# Patient Record
Sex: Female | Born: 1950
Health system: Southern US, Community
[De-identification: ages and names within clinical notes are randomized; demographics above are authoritative.]

## PROBLEM LIST (undated history)

## (undated) DIAGNOSIS — H3500 Unspecified background retinopathy: Secondary | ICD-10-CM

## (undated) DIAGNOSIS — I509 Heart failure, unspecified: Secondary | ICD-10-CM

## (undated) DIAGNOSIS — F419 Anxiety disorder, unspecified: Secondary | ICD-10-CM

## (undated) DIAGNOSIS — E669 Obesity, unspecified: Secondary | ICD-10-CM

## (undated) DIAGNOSIS — R600 Localized edema: Secondary | ICD-10-CM

## (undated) DIAGNOSIS — I272 Pulmonary hypertension, unspecified: Secondary | ICD-10-CM

## (undated) DIAGNOSIS — E7800 Pure hypercholesterolemia, unspecified: Secondary | ICD-10-CM

## (undated) DIAGNOSIS — I10 Essential (primary) hypertension: Secondary | ICD-10-CM

## (undated) DIAGNOSIS — G4733 Obstructive sleep apnea (adult) (pediatric): Secondary | ICD-10-CM

## (undated) DIAGNOSIS — H53009 Unspecified amblyopia, unspecified eye: Secondary | ICD-10-CM

## (undated) DIAGNOSIS — F329 Major depressive disorder, single episode, unspecified: Secondary | ICD-10-CM

## (undated) HISTORY — DX: Pulmonary hypertension, unspecified: I27.20

## (undated) HISTORY — PX: DILATION AND CURETTAGE OF UTERUS: SHX78

## (undated) HISTORY — DX: Obesity, unspecified: E66.9

## (undated) HISTORY — DX: Major depressive disorder, single episode, unspecified: F32.9

## (undated) HISTORY — PX: EYE SURGERY: SHX253

## (undated) HISTORY — DX: Anxiety disorder, unspecified: F41.9

## (undated) HISTORY — DX: Unspecified amblyopia, unspecified eye: H53.009

## (undated) HISTORY — PX: WISDOM TOOTH EXTRACTION: SHX21

## (undated) HISTORY — DX: Essential (primary) hypertension: I10

## (undated) HISTORY — DX: Unspecified background retinopathy: H35.00

## (undated) HISTORY — DX: Obstructive sleep apnea (adult) (pediatric): G47.33

## (undated) HISTORY — DX: Localized edema: R60.0

## (undated) HISTORY — DX: Pure hypercholesterolemia, unspecified: E78.00

---

## 1998-06-28 ENCOUNTER — Encounter: Payer: Self-pay | Admitting: Obstetrics and Gynecology

## 1998-06-28 ENCOUNTER — Ambulatory Visit (HOSPITAL_COMMUNITY): Admission: RE | Admit: 1998-06-28 | Discharge: 1998-06-28 | Payer: Self-pay | Admitting: Obstetrics and Gynecology

## 1999-04-12 ENCOUNTER — Other Ambulatory Visit: Admission: RE | Admit: 1999-04-12 | Discharge: 1999-04-12 | Payer: Self-pay | Admitting: Obstetrics and Gynecology

## 1999-08-01 ENCOUNTER — Ambulatory Visit (HOSPITAL_COMMUNITY): Admission: RE | Admit: 1999-08-01 | Discharge: 1999-08-01 | Payer: Self-pay | Admitting: Obstetrics and Gynecology

## 1999-08-01 ENCOUNTER — Encounter: Payer: Self-pay | Admitting: Obstetrics and Gynecology

## 1999-09-07 ENCOUNTER — Emergency Department (HOSPITAL_COMMUNITY): Admission: EM | Admit: 1999-09-07 | Discharge: 1999-09-07 | Payer: Self-pay | Admitting: *Deleted

## 2000-04-19 ENCOUNTER — Other Ambulatory Visit: Admission: RE | Admit: 2000-04-19 | Discharge: 2000-04-19 | Payer: Self-pay | Admitting: Obstetrics and Gynecology

## 2000-10-14 ENCOUNTER — Encounter: Payer: Self-pay | Admitting: Obstetrics and Gynecology

## 2000-10-14 ENCOUNTER — Encounter: Admission: RE | Admit: 2000-10-14 | Discharge: 2000-10-14 | Payer: Self-pay | Admitting: Obstetrics and Gynecology

## 2000-11-05 ENCOUNTER — Encounter: Admission: RE | Admit: 2000-11-05 | Discharge: 2001-01-22 | Payer: Self-pay | Admitting: *Deleted

## 2001-05-30 ENCOUNTER — Other Ambulatory Visit: Admission: RE | Admit: 2001-05-30 | Discharge: 2001-05-30 | Payer: Self-pay | Admitting: Obstetrics and Gynecology

## 2002-04-25 ENCOUNTER — Encounter: Payer: Self-pay | Admitting: Internal Medicine

## 2002-04-25 ENCOUNTER — Ambulatory Visit (HOSPITAL_COMMUNITY): Admission: RE | Admit: 2002-04-25 | Discharge: 2002-04-25 | Payer: Self-pay | Admitting: Internal Medicine

## 2002-06-03 ENCOUNTER — Other Ambulatory Visit: Admission: RE | Admit: 2002-06-03 | Discharge: 2002-06-03 | Payer: Self-pay | Admitting: Obstetrics and Gynecology

## 2002-10-23 ENCOUNTER — Encounter (INDEPENDENT_AMBULATORY_CARE_PROVIDER_SITE_OTHER): Payer: Self-pay | Admitting: *Deleted

## 2002-10-23 ENCOUNTER — Ambulatory Visit (HOSPITAL_COMMUNITY): Admission: RE | Admit: 2002-10-23 | Discharge: 2002-10-23 | Payer: Self-pay | Admitting: Obstetrics and Gynecology

## 2003-01-06 ENCOUNTER — Encounter: Admission: RE | Admit: 2003-01-06 | Discharge: 2003-01-06 | Payer: Self-pay | Admitting: Obstetrics and Gynecology

## 2003-01-06 ENCOUNTER — Encounter: Payer: Self-pay | Admitting: Obstetrics and Gynecology

## 2003-06-16 ENCOUNTER — Other Ambulatory Visit: Admission: RE | Admit: 2003-06-16 | Discharge: 2003-06-16 | Payer: Self-pay | Admitting: Obstetrics and Gynecology

## 2004-05-16 ENCOUNTER — Encounter: Admission: RE | Admit: 2004-05-16 | Discharge: 2004-05-16 | Payer: Self-pay | Admitting: Obstetrics and Gynecology

## 2004-08-04 ENCOUNTER — Other Ambulatory Visit: Admission: RE | Admit: 2004-08-04 | Discharge: 2004-08-04 | Payer: Self-pay | Admitting: Obstetrics and Gynecology

## 2005-01-15 ENCOUNTER — Encounter: Admission: RE | Admit: 2005-01-15 | Discharge: 2005-01-15 | Payer: Self-pay | Admitting: Family Medicine

## 2005-07-30 ENCOUNTER — Encounter: Admission: RE | Admit: 2005-07-30 | Discharge: 2005-07-30 | Payer: Self-pay | Admitting: Obstetrics and Gynecology

## 2005-09-14 ENCOUNTER — Other Ambulatory Visit: Admission: RE | Admit: 2005-09-14 | Discharge: 2005-09-14 | Payer: Self-pay | Admitting: Obstetrics and Gynecology

## 2005-12-10 ENCOUNTER — Ambulatory Visit (HOSPITAL_COMMUNITY): Admission: RE | Admit: 2005-12-10 | Discharge: 2005-12-10 | Payer: Self-pay | Admitting: Obstetrics and Gynecology

## 2006-05-22 ENCOUNTER — Ambulatory Visit (HOSPITAL_COMMUNITY): Admission: RE | Admit: 2006-05-22 | Discharge: 2006-05-22 | Payer: Self-pay | Admitting: Obstetrics and Gynecology

## 2006-08-28 ENCOUNTER — Encounter: Admission: RE | Admit: 2006-08-28 | Discharge: 2006-08-28 | Payer: Self-pay | Admitting: Obstetrics and Gynecology

## 2007-09-02 ENCOUNTER — Encounter: Admission: RE | Admit: 2007-09-02 | Discharge: 2007-09-02 | Payer: Self-pay | Admitting: Obstetrics & Gynecology

## 2007-09-24 ENCOUNTER — Other Ambulatory Visit: Admission: RE | Admit: 2007-09-24 | Discharge: 2007-09-24 | Payer: Self-pay | Admitting: Obstetrics & Gynecology

## 2008-09-30 ENCOUNTER — Other Ambulatory Visit: Admission: RE | Admit: 2008-09-30 | Discharge: 2008-09-30 | Payer: Self-pay | Admitting: Obstetrics & Gynecology

## 2009-06-01 ENCOUNTER — Encounter: Admission: RE | Admit: 2009-06-01 | Discharge: 2009-06-01 | Payer: Self-pay | Admitting: Obstetrics & Gynecology

## 2010-06-06 ENCOUNTER — Encounter
Admission: RE | Admit: 2010-06-06 | Discharge: 2010-06-06 | Payer: Self-pay | Source: Home / Self Care | Attending: Obstetrics & Gynecology | Admitting: Obstetrics & Gynecology

## 2010-07-16 ENCOUNTER — Encounter: Payer: Self-pay | Admitting: Family Medicine

## 2010-11-10 NOTE — Op Note (Signed)
NAMEAUBREANA, CORNACCHIA NO.:  [id number]   MEDICAL RECORD NO.:  [id number]          PATIENT TYPE:  AMB   LOCATION:  SDC                           FACILITY:  [facility]   PHYSICIAN:  Dineen Kid. Rana Snare, M.D.    DATE OF BIRTH:  1951/02/21   DATE OF PROCEDURE:  05/22/2006  DATE OF DISCHARGE:                               OPERATIVE REPORT   PREOPERATIVE DIAGNOSIS:  Abnormal uterine bleeding with endometrial mass  __________.   POSTOPERATIVE DIAGNOSIS:  1. Abnormal uterine bleeding with endometrial mass __________.  2. Uterine synechiae.   OPERATION/PROCEDURE:  Hysteroscopy and dilatation and curettage.   SURGEON:  Dineen Kid. Rana Snare, M.D.   ANESTHESIA:  General by LMA.   INDICATIONS:  Mrs. Kayla Brown is a 60 year old nulligravida white female on  Depo-Provera where for a number of years began irregular spotting. Has  history of endometrial polyp which was benign several years ago which  was removed on D&C.  She underwent saline infusion ultrasound showing a  small endometrial mass suspicious for endometrial polyp. Presents today  for hysteroscopy D&C.  We had also discussed the possibility of doing a  NovaSure endometrial ablation.  She has read the handout and she has  given her written informed consent.  Risks and benefits of the procedure  were discussed at length.  These include but are not limited to risk of  infection, bleeding, damage to the uterus, tubes and ovaries, bowel and  bladder, risks associated with blood transfusions, risks associated with  anesthesia, success rates and risks associated with endometrial  ablation.  She does give her informed consent and wished to proceed.   FINDINGS:  A very small uterus with stenotic cervix and atrophic  endometrium.  Uterine synechiae from the anterior posterior endometrial  wall.  Perforation from the uterine sounds through the fundus which was  hemostatic.   DESCRIPTION OF PROCEDURE:  After adequate analgesia, the patient  was  placed in the dorsal lithotomy position.  She was sterilely prepped and  draped.  The bladder was sterilely drained.  Weighted speculum was  placed and tenaculum was placed on the anterior lip of the cervix.  A  paracervical block was placed with 1% Xylocaine and 1:100,000  epinephrine.  The uterus was sounded to 6 cm originally and after  dilating to a #25 dilator, the uterine sound was inserted again, now  going to 8-9 cm.  Insertion of the hysteroscope reveals the above  findings. Uterine cavity looked pretty much like cobwebs with uterine  synechiae from the top to the bottom, very fine, filmy with occasional  larger bands disrupted easily with the hysteroscope.  Also gentle sharp  curettage reexamination with the hysteroscope revealed a fundal  perforation was through the uterine fundus, unable to see into the intra-  abdominal cavity.  My suspicions were that it was just below the serosa.  It was hemostatic after careful examination for several minutes.  Reexamination of the uterine cavity revealed the uterine synechiae had  been disrupted with the camera and also the dilation by hysteroscope and  curettage.  No evidence of  residual polyps were noted due to the uterine  perforation.  Although hemostatic, we elected not to proceed with an  endometrial ablation.  The hysteroscope was then removed, tenaculum  removed from the cervix.  Reexamination of the cervix revealed minimal  bleeding through the endocervix.  __________ exam revealed a very small  uterus which was mobile and no masses were palpable.  We did have a  sorbitol deficit of 435 mL at the time of discontinuing the system with  the small uterine perforation.  The patient was stable on transfer to  the recovery room.  Sponge, instrument and needle counts were correct  x3.  Estimated blood loss was minimal.  The patient did receive 1 g of  cefoxitin preoperatively.   DISPOSITION:  The patient will be observed for a  little while in the  recovery room to make sure she is stable.  If she does remain stable,  then we will discharge her home on Cipro 500 mg b.i.d. for seven days  and return for any increased pain, fever or bleeding in the next 24 to  48 hours especially.  Otherwise we will have her follow up one week in  the office.      Dineen Kid Rana Snare, M.D.  Electronically Signed     DCL/MEDQ  D:  05/22/2006  T:  05/22/2006  Job:  [id number]

## 2010-11-10 NOTE — Op Note (Signed)
NAME:  Kayla Brown, Kayla Brown                         ACCOUNT NO.:  [id number]   MEDICAL RECORD NO.:  [id number]                   PATIENT TYPE:  AMB   LOCATION:  SDC                                  FACILITY:  [facility]   PHYSICIAN:  Dineen Kid. Rana Snare, M.D.                 DATE OF BIRTH:  [birth year]   DATE OF PROCEDURE:  10/23/2002  DATE OF DISCHARGE:                                 OPERATIVE REPORT   PREOPERATIVE DIAGNOSES:  1. Abnormal uterine bleeding.  2. Endometrial mass on sonohysterogram.   POSTOPERATIVE DIAGNOSES:  1. Abnormal uterine bleeding.  2. Endometrial mass on sonohysterogram.  3. Questionable endometrial polyp.   PROCEDURE:  Hysteroscopy, dilatation and curettage.   SURGEON:  Dineen Kid. Rana Snare, M.D.   ANESTHESIA:  Monitored anesthetic care and paracervical block.   INDICATIONS:  The patient is a 60 year old nulligravida white female with  abnormal bleeding who underwent a sonohysterogram, which showed multiple  fibroids but also endometrial mass measuring 3.7 mm in size, polypoid in  nature.  It was consistent with a polyp.  She presents for a hysteroscopy  and D&C evaluation and removal of this.  Risks and benefits were discussed  at length, informed consent was obtained, and the findings of a small  endometrial polypoid mass consistent with a polyp, otherwise normal-  appearing endometrium, question of a small submucosal fibroid at the  posterior wall, normal-appearing ostia, normal-appearing cervix.   DESCRIPTION OF PROCEDURE:  After adequate anesthesia, the patient was placed  in the dorsal lithotomy position.  She was sterilely prepped and draped, the  bladder was sterilely drained.  The Graves speculum was placed, a  paracervical block was placed with 1% Xylocaine with 1:100,000 epinephrine,  20 mL used.  A tenaculum was placed on the anterior lip of the cervix and  uterus was sounded to 7 cm using the dilator to a #29 Pratt dilator.  The  hysteroscope was inserted, and  the above findings were noted.  Sharp  curettage was performed and curetted small fragments of tissue.  This was  performed until a gritty surface was felt throughout the endometrial cavity.  The hysteroscope was reinserted and normal-appearing endometrium at this  time was noted with normal-appearing ostia and it appeared that the polyp  had been removed.  The tissue was sent for the endometrial curettings.  At  this time the hysteroscope was removed.  The tenaculum was removed from the  anterior lip of the cervix, noted to be hemostatic.  The patient was stable  on transfer to the recovery room.  The sponge, needle, and instrument count  was normal x3.  The patient received Toradol 30 mg IV.  The sorbitol deficit  was minimal. The estimated blood loss was less than 10 mL.    DISPOSITION:  The patient was discharged home with a routine instruction  sheet, to follow up in the office in two  or three weeks, told to return for  any increased bleeding, pain, or fever.                                                Dineen Kid Rana Snare, M.D.    DCL/MEDQ  D:  10/23/2002  T:  10/24/2002  Job:  [id number]

## 2010-11-10 NOTE — H&P (Signed)
   NAME:  Kayla Brown, Kayla Brown                         ACCOUNT NO.:  [id number]   MEDICAL RECORD NO.:  [id number]                   PATIENT TYPE:  AMB   LOCATION:  SDC                                  [facility]   PHYSICIAN:  Dineen Kid. Rana Snare, M.D.                 DATE OF BIRTH:  10/10/1950   DATE OF ADMISSION:  DATE OF DISCHARGE:                                HISTORY & PHYSICAL   HISTORY OF PRESENT ILLNESS:  The patient is a 60 year old nulligravida white  female with abnormal uterine bleeding.  She underwent a sonohystogram on  September 18, 2002, which showed multiple fibroids and an endometrial mass  measuring 3.7 mm in size, which is polypoid consistent with a polyp on the  anterior wall.  She presents today for hysteroscopy and D&C for evaluation  and removal of this.   PAST MEDICAL HISTORY:  The past medical history is significant for  hypertension and anxiety disorder.  Also diagnosed with adult onset  diabetes.   MEDICATIONS:  Medications include Glucotrol, Altace, Ecotrin,  hydrochlorothiazide, Effexor, Ocuvite, Pressor Vision, and Depo-Provera.   PHYSICAL EXAMINATION:  An ultrasound showed multiple fibroids, the largest  measuring 3.2 cm in size, and the right and left ovaries within normal  limits.  Endometrial mass 3.7 cm, polypoid on the anterior wall, consistent  a polyp.   IMPRESSION AND PLAN:  1. Abnormal uterine bleeding.  2. Fibroids and endometrial mass consistent with polyp.   Will admit for hysteroscopy and D&C for evaluation and removal of the polyp.  I feel that D&C has a good chance of fixing the bleeding due to the fact  that there is something consistent with a polyp noticeable and it could also  be related to the fibroids.  Because there is an endometrial mass I  recommend that this be removed and studied in the pathology lab.  The risks  and benefits of hysterectomy and D&C were discussed at length and informed  consent was obtained.                                      Dineen Kid Rana Snare, M.D.    DCL/MEDQ  D:  10/22/2002  T:  10/22/2002  Job:  [id number]

## 2010-11-10 NOTE — Op Note (Signed)
Kayla Brown, Kayla Brown               ACCOUNT NO.:  [id number]   MEDICAL RECORD NO.:  [id number]          PATIENT TYPE:  AMB   LOCATION:  ENDO                         FACILITY:  [facility]   PHYSICIAN:  James L. Malon Kindle., M.D.DATE OF BIRTH:  12/31/1950   DATE OF PROCEDURE:  12/10/2005  DATE OF DISCHARGE:                                 OPERATIVE REPORT   PROCEDURE PERFORMED:  Colonoscopy.   ENDOSCOPIST:  Llana Aliment. Randa Evens, M.D.   MEDICATIONS:  Phenergan 12.5 mg, fentanyl 75 mcg, Versed 8 mg IV.   INDICATIONS FOR PROCEDURE:  Colon cancer screening.   DESCRIPTION OF PROCEDURE:  The procedure had been explained to the patient  and consent obtained.  With the patient in the left lateral decubitus  position, the pediatric adjustable Olympus colonoscope was inserted and  advanced.  The prep was excellent and we were able to reach the cecum  without difficulty.  The ileocecal valve and appendiceal orifice were seen.  The scope was withdrawn and the cecum, ascending colon, transverse colon,  descending and sigmoid colon were seen well, no polyps seen.  Normal  vascular pattern throughout.  No diverticulosis, masses, etc.  The scope was  withdrawn to the rectum.  Minimal hemorrhoids were seen on retroflex view of  the rectum and was otherwise normal.  Scope withdrawn.  The patient  tolerated the procedure well.   ASSESSMENT:  1.  Normal screening colonoscopy, V76.51.   PLAN:  Routine follow-up with resumption of Hemoccults in five years.  Consider another colonoscopy in 10 years.           ______________________________  Llana Aliment Malon Kindle., M.D.     Waldron Session  D:  12/10/2005  T:  12/11/2005  Job:  [id number]   cc:   Dineen Kid. Rana Snare, M.D.  Fax: [phone]   Vikki Ports, M.D.  Fax: [phone]

## 2011-05-14 ENCOUNTER — Encounter: Payer: 59 | Attending: Family Medicine | Admitting: *Deleted

## 2011-05-15 ENCOUNTER — Encounter: Payer: Self-pay | Admitting: *Deleted

## 2011-05-15 NOTE — Patient Instructions (Signed)
Patient will attend Core Diabetes Courses as scheduled or follow up prn.  

## 2011-05-15 NOTE — Progress Notes (Signed)
  Patient was seen on 05/14/11 for the first of a series of three diabetes self-management courses at the [facility]. The following learning objectives were met by the patient during this course:   Defines diabetes and the role of insulin  Identifies type of diabetes and pathophysiology  States normal BG range and personal goals  Identifies three risk factors for the development of diabetes  States the need for and frequency of healthcare follow up (ADA Standards of Care)  A1c Level: 6.9% (per [facility]; 05/02/11)  Handouts given during class include:  [facility] Core Class 1 Handout  ADA: What's My Number (A1c handout)  [facility] ADA Standards of Care Handout  [facility] Medication Log handout  ADA Quick & Easy Diabetic Cooking (cookbook)  "The Plate Method" tear off  Patient has established the following initial goals:  Increase exercise  Work on stress levels  Lose weight  Follow-Up Plan: Patient will attend Core Diabetes Courses as scheduled or follow up prn.

## 2011-06-05 ENCOUNTER — Encounter: Payer: 59 | Attending: Family Medicine

## 2011-06-11 NOTE — Progress Notes (Signed)
  Patient was seen on 06/05/2011 for the second of a series of three diabetes self-management courses at the [facility]. The following learning objectives were met by the patient during this course:   States the relationship of exercise to blood glucose  States benefits/barriers of regular and safe exercise  States three guidelines for safe and effective exercise  Describes personal diabetes medicine regimen  Describes actions of own medications  Describes causes, symptoms, and treatment of hypo/hyperglycemia  Describes sick day rules  Identifies when to test urine for ketones when appropriate  Identifies when to call healthcare provider for acute complications  States the risk for problems with foot, skin, and dental care  States preventative foot, skin, and dental care measures  States when to call healthcare provider regarding foot, skin, and dental care  Identifies methods for evaluation of diabetes control  Discusses benefits of SBGM  Identifies relationship between nutrition, exercise, medication, and glucose levels  Discusses the importance of record keeping  Patient received [facility] Core Program Notebook at class.  Follow-Up Plan: Patient will attend the final class of the ADA Diabetes Self-Care Education.    

## 2011-06-12 ENCOUNTER — Encounter: Payer: 59 | Admitting: Dietician

## 2011-06-13 NOTE — Progress Notes (Signed)
  Patient was seen on 06/13/2011 for the third of a series of three diabetes self-management courses at the [facility]. The following learning objectives were met by the patient during this course:   Identifies nutrient effects on glycemia  States the general guidelines of meal planning  Relates understanding of personal meal plan  Describes situations that cause stress and discuss methods of stress management  Identifies lifestyle behaviors for change  The following handouts were given in class:  [employer] Carbohydrate Counting book  3 Month Follow Up Visit handout  Goal setting handout  Class evaluation form  Your patient has established the following 3 month goals for diabetes self-care:  Bike for 15 minutes 3 times weekly, intend to start out slow.  Follow-Up Plan: Patient will attend a 3 month follow-up visit for diabetes self-management education.

## 2011-06-29 ENCOUNTER — Other Ambulatory Visit: Payer: Self-pay | Admitting: Obstetrics & Gynecology

## 2011-06-29 DIAGNOSIS — Z1231 Encounter for screening mammogram for malignant neoplasm of breast: Secondary | ICD-10-CM

## 2011-08-06 ENCOUNTER — Ambulatory Visit
Admission: RE | Admit: 2011-08-06 | Discharge: 2011-08-06 | Disposition: A | Discharge status: Home / Self Care | Payer: 59 | Source: Ambulatory Visit | Attending: Obstetrics & Gynecology | Admitting: Obstetrics & Gynecology

## 2011-08-06 DIAGNOSIS — Z1231 Encounter for screening mammogram for malignant neoplasm of breast: Secondary | ICD-10-CM

## 2011-09-20 ENCOUNTER — Encounter: Payer: 59 | Attending: Family Medicine | Admitting: Dietician

## 2011-09-24 ENCOUNTER — Encounter: Payer: Self-pay | Admitting: Dietician

## 2011-09-24 NOTE — Progress Notes (Signed)
  Patient was seen on 09/19/2016 for their 3 month follow-up as a part of the diabetes self-management courses at the [facility]. The following learning objectives were met by your patient during this course:  Patient self reports the following:  Diabetes control has improved since diabetes self-management training: yes Number of days blood glucose is >200: Rare Last MD appointment for diabetes: December 2012 Changes in treatment plan: Changed Actos to generic version of the medication Confidence with ability to manage diabetes: Feels better able to manage her diabetes Areas for improvement with diabetes self-care: Trying to do what I'm suppose to do. Exercise more often and to gain control over my portion sizes. Willingness to participate in diabetes support group: Unsure related to husband's work schedule  Weight:  04/2011= 206.9 lb   09/20/2011= 207.8 lb  Gained 0.9 lb in last 4 months A1C 05/2011 = 6.8%  Please see Diabetes Flow sheet for findings related to patient's self-care.  Follow-Up Plan: Patient is eligible for a "free" 30 minute diabetes self-care appointment in the next year. Patient to call and schedule as needed.

## 2012-12-31 ENCOUNTER — Ambulatory Visit (INDEPENDENT_AMBULATORY_CARE_PROVIDER_SITE_OTHER): Payer: Self-pay | Admitting: Family Medicine

## 2012-12-31 VITALS — BP 146/74 | Wt 194.0 lb

## 2012-12-31 DIAGNOSIS — E119 Type 2 diabetes mellitus without complications: Secondary | ICD-10-CM

## 2013-01-01 DIAGNOSIS — E119 Type 2 diabetes mellitus without complications: Secondary | ICD-10-CM | POA: Insufficient documentation

## 2013-01-01 NOTE — Progress Notes (Signed)
Patient presents today for annual pharmacy consult as part of the employer-sponsored [employer] program. Current diabetes regimen includes pioglitazone, glipizide, and metformin. Patient also continues on daily ASA, ACEi, and statin. Most recent MD follow-up was in early June. Patient has a pending appt for 6 mo follow-up. No med changes other than addition of Vit D 4000 IU daily. No major health changes at this time.    Diabetes Assessment: Type of Diabetes: Type 2; Sees Diabetes provider 2 times per year; Diabetes Education Attended [facility] classes; MD managing Diabetes Juluis Rainier MD; checks blood glucose 2-4 times a day; takes medications as prescribed; uses glucometer Trueresult glucometer; 7 day CBG average 131; 30 day CBG average 121; 14 day CBG average 129; Lowest CBG 62; Highest CBG 267; hypoglycemia frequency freq; takes an aspirin a day; A1c = 6.7 (prev 6.9) Other Diabetes History: Current med regimen includes pioglitazone 30 mg daily, glipizide xl 10 mg twice daily, and metformin ER 500 mg - two tablets twice daily. Patient does maintain good medication compliance. Patient did bring meter today and is currently testing 2-4 times per day and keeps a great glucose log with all readings. Glucose monitoring occurs fasting/pre-prandial/post-prandial/bedtime/when symptomatic. Hypoglycemia is frequent in the mornings with fasting readings often in the 60s. Post prandial readings sometimes reach >200, but on these occassions patient almost always recalls dietary non-compliance as the cause. Overall glucose is under good control. At this time patient is taking her evening dose of glipizide prior to bed. I have recommended moving this dose to dinner and always taking glipizide with food in order to reduce fasting hypoglycemia. Patient agreed to try this. Patient reports signs and symptoms of neuropathy including numbness/tingling/burning and symptoms of foot infection; however, she does have a  recurring area of onychomycosis on her left second toe. She will see a podiatrist if this does not improve. MD is aware. Patient is not due for yearly eye exam.  Lifestyle Factors: Diet - Patient admits that her diet has room for improvement but states that she is attempting to make better choices. She reports the following food recall:  Breakfast - cheese toast/milk Lunch - usually eats out for lunch, [place] buff[employer], fast food, [employer] - cooks at home Snacks - Rev wrap by [employer], BP, cheese Fruits/Veg - only on occassion, sometimes eats a salad or orders vegetables when eating out. Beverages - water, diet coke, tea with sweet-n-low, 2% milk Patient reports the most uncontrolled area of her diet is her consumption of fried foods. She enjoys french fries, fried foods, etc approximately three times per week. Patient and her husband both have a nutrition consult scheduled at end of August.  Exercise - Patient maintains an excellent exercise regimen and records all physical activity in a log book. She is currently exercising on a stationary bike for 10-20 min 3-4 times per week. She is aware of the 150 min/week goal and hopes to acheive this in the future.   Assessment: Patient presents today for annual pharmacy consult. She is knowledgable about medications and reports compliance. She does a great job of testing and recording these readings in a log book. She also keeps a great journal of physical activity. A1c is at goal and is currently 6.7 (prev 6.9 on 11/26/12). Patient was pleased with improved A1c. At this time her biggest challenge is her diet and we have set goals to work on improving this. Patient will begin following-up with Megan in three months.   Plan: 1)  Attempt to limit fried foods to once weekly 2) Attempt to maintain healthy dietary choices  3) Continue exercise regimen - great job! 4) Continue testing glucose - great job! 5) Begin taking evening glipizide dose with supper 6)  Follow-up in 3 months on Monday, October 13th @ 2:30 pm with Gerre Pebbles, pharmacist

## 2013-01-06 ENCOUNTER — Encounter: Payer: Self-pay | Admitting: Certified Nurse Midwife

## 2013-01-08 ENCOUNTER — Ambulatory Visit (INDEPENDENT_AMBULATORY_CARE_PROVIDER_SITE_OTHER): Payer: 59 | Admitting: Certified Nurse Midwife

## 2013-01-08 ENCOUNTER — Encounter: Payer: Self-pay | Admitting: Certified Nurse Midwife

## 2013-01-08 VITALS — BP 110/62 | HR 72 | Resp 16 | Ht 60.25 in | Wt 195.0 lb

## 2013-01-08 DIAGNOSIS — Z0000 Encounter for general adult medical examination without abnormal findings: Secondary | ICD-10-CM

## 2013-01-08 DIAGNOSIS — Z01419 Encounter for gynecological examination (general) (routine) without abnormal findings: Secondary | ICD-10-CM

## 2013-01-08 DIAGNOSIS — N951 Menopausal and female climacteric states: Secondary | ICD-10-CM

## 2013-01-08 DIAGNOSIS — Z1211 Encounter for screening for malignant neoplasm of colon: Secondary | ICD-10-CM

## 2013-01-08 MED ORDER — PROGESTERONE MICRONIZED 200 MG PO CAPS: 200.0000 mg | ORAL_CAPSULE | Freq: Every day | ORAL | Status: DC

## 2013-01-08 MED ORDER — ESTRADIOL 0.5 MG PO TABS: 0.5000 mg | ORAL_TABLET | Freq: Every day | ORAL | Status: DC

## 2013-01-08 NOTE — Progress Notes (Signed)
62 y.o. G0P0000 Married Caucasian Fe here for annual exam.  Menopausal on HRT , desires continuance.Denies vaginal bleeding or vaginal discharge. Diabetes in control with medication. Hypertension stable on medication. Sees PCP every 6 months for medication management and labs. No health issues today.  Patient's last menstrual period was 06/25/2008.          Sexually active: yes  The current method of family planning is none.    Exercising: yes  biking Smoker:  no  Health Maintenance: Pap:  01-04-12 neg HPV HR neg MMG:  2013 Colonoscopy:  2007 BMD:   2009 TDaP:  2011 Labs: none Self breast exam: done occ   reports that she has never smoked. She has never used smokeless tobacco. She reports that she does not drink alcohol or use illicit drugs.  Past Medical History  Diagnosis Date  . Diabetes mellitus   . Pure hypercholesterolemia   . Essential hypertension, benign   . Major depressive disorder, single episode, unspecified     Past Surgical History  Procedure Laterality Date  . Dilation and curettage of uterus  2005-2008    x2  . Eye surgery      cataracts  . Wisdom tooth extraction      Current Outpatient Prescriptions  Medication Sig Dispense Refill  . AMLODIPINE BESYLATE PO Take 5 mg by mouth daily.      [name] aspirin 325 MG tablet Take 325 mg by mouth daily.        [name] atorvastatin (LIPITOR) 40 MG tablet Take 40 mg by mouth daily.        . Cholecalciferol (VITAMIN D PO) Take 4,000 Int'l Units by mouth.      [name] CINNAMON PO Take 1,000 mg by mouth daily.      . cyclobenzaprine (FLEXERIL) 10 MG tablet Take 10 mg by mouth 3 (three) times daily as needed for muscle spasms.      [name] diltiazem (DILACOR XR) 240 MG 24 hr capsule Take 240 mg by mouth daily.        [name] estradiol (ESTRACE) 0.5 MG tablet Take by mouth daily.       . fish oil-omega-3 fatty acids 1000 MG capsule Take 1 g by mouth daily.        [name] FOLIC ACID PO Take 1 tablet by mouth daily.        [name] GARLIC PO Take 1,610 mg by  mouth daily.        [name] glipiZIDE (GLIPIZIDE XL) 10 MG 24 hr tablet Take 10 mg by mouth 2 (two) times daily.        . hydrochlorothiazide (MICROZIDE) 12.5 MG capsule Take 12.5 mg by mouth daily.        [name] loratadine (CLARITIN) 10 MG tablet Take 10 mg by mouth as needed.       . metFORMIN (GLUCOPHAGE-XR) 500 MG 24 hr tablet Take by mouth. 4 tabs at hs      . Multiple Vitamins-Minerals (VISION FORMULA PO) Take 50 mg by mouth daily.      . Multiple Vitamins-Minerals (VISION FORMULA) TABS Take 1 tablet by mouth every morning.       . nabumetone (RELAFEN) 500 MG tablet Take 500 mg by mouth as needed for pain.      . pioglitazone (ACTOS) 30 MG tablet Take 30 mg by mouth daily.        . progesterone (PROMETRIUM) 200 MG capsule Take 200 mg by mouth daily. 1 daily per Dr. Hyacinth Meeker       .  ramipril (ALTACE) 10 MG capsule Take 10 mg by mouth daily.        [name] venlafaxine (EFFEXOR-XR) 75 MG 24 hr capsule Take 75 mg by mouth daily.        . vitamin E (VITAMIN E) 400 UNIT capsule Take 400 Units by mouth daily.         No current facility-administered medications for this visit.    Family History  Problem Relation Age of Onset  . Adopted: Yes    ROS:  Pertinent items are noted in HPI.  Otherwise, a comprehensive ROS was negative.  Exam:   BP 110/62  Pulse 72  Resp 16  Ht 5' 0.25" (1.53 m)  Wt 195 lb (88.451 kg)  BMI 37.79 kg/m2  LMP 06/25/2008 Height: 5' 0.25" (153 cm)  Ht Readings from Last 3 Encounters:  01/08/13 5' 0.25" (1.53 m)  09/24/11 5' (1.524 m)  05/14/11 5' (1.524 m)    General appearance: alert, cooperative and appears stated age Head: Normocephalic, without obvious abnormality, atraumatic Neck: no adenopathy, supple, symmetrical, trachea midline and thyroid normal to inspection and palpation Lungs: clear to auscultation bilaterally Breasts: normal appearance, no masses or tenderness, No nipple retraction or dimpling, No nipple discharge or bleeding, No axillary or supraclavicular  adenopathy Heart: regular rate and rhythm Abdomen: soft, non-tender; no masses,  no organomegaly Extremities: extremities normal, atraumatic, no cyanosis or edema Skin: Skin color, texture, turgor normal. No rashes or lesions Lymph nodes: Cervical, supraclavicular, and axillary nodes normal. No abnormal inguinal nodes palpated Neurologic: Grossly normal   Pelvic: External genitalia:  no lesions              Urethra:  normal appearing urethra with no masses, tenderness or lesions              Bartholin's and Skene's: normal                 Vagina: normal appearing vagina with normal color and discharge, no lesions              Cervix: non tender, norma.              Pap taken: no Bimanual Exam:  Uterus:  normal size, contour, position, consistency, mobility, non-tender and mid position              Adnexa: normal adnexa and no mass, fullness, tenderness               Rectovaginal: Confirms               Anus:  normal sphincter tone, no lesions  A:  Well Woman with normal exam  Menopausal on HRT  Hypertension stable with PCP management  Diabetes stable with PCP management   P:   Reviewed health and wellness pertinent to exam  Discussed risk and benefits of HRT and appropriate to continue use, patient agrees Rx Estradiol see order Rx Prometrium see order  Continue PCP follow up as indicated  Pap smear as per guidelines   Mammogram yearly pap smear not taken today IFOB dispensed counseled on breast self exam, mammography screening, feminine hygiene, use and side effects of HRT, adequate intake of calcium and vitamin D, diet and exercise  return annually or prn  An After Visit Summary was printed and given to the patient.  Reviewed, TL

## 2013-01-08 NOTE — Patient Instructions (Addendum)

## 2013-01-27 NOTE — Progress Notes (Signed)
Patient ID: Kayla Brown, female   DOB: [birth year], 62 y.o.   MRN: [id number] ATTENDING PHYSICIAN NOTE: I have reviewed the chart and agree with the plan as detailed above. Denny Levy MD Pager [phone]

## 2013-01-30 ENCOUNTER — Telehealth: Payer: Self-pay | Admitting: Obstetrics & Gynecology

## 2013-01-30 NOTE — Telephone Encounter (Signed)
Patient calling about the results of her on her stool culture.

## 2013-02-02 NOTE — Telephone Encounter (Signed)
Pt notified that once it is done that we will call her

## 2013-02-19 ENCOUNTER — Encounter: Payer: Self-pay | Admitting: *Deleted

## 2013-02-19 ENCOUNTER — Encounter: Payer: 59 | Attending: Family Medicine | Admitting: *Deleted

## 2013-02-19 VITALS — Ht 60.5 in | Wt 192.5 lb

## 2013-02-19 DIAGNOSIS — Z713 Dietary counseling and surveillance: Secondary | ICD-10-CM | POA: Insufficient documentation

## 2013-02-19 DIAGNOSIS — E119 Type 2 diabetes mellitus without complications: Secondary | ICD-10-CM | POA: Insufficient documentation

## 2013-02-19 NOTE — Progress Notes (Signed)
Medical Nutrition Therapy:  Appt start time: 1415 end time:  1500.  Assessment:  Patient with type 2 diabetes. She has been through the Diabetes Core Classes. She is here today with her husband for a combined visit. She has made many changes within the last year, including limiting portions and trying to eat the right foods. She is also using a stationary bike at home, but not as much as she would like. Her A1c is down to 6.7 from 6.9 at the previous test. She is checking her blood glucose 1-2 times daily with fasting blood glucose <120 in the mornings and <140 before meals. She has had some low reading in the morning, but will include a snack at night, which helps. Her weight is down 23 pounds over the last 1-2 years.   MEDICATIONS: Metformin, pioglitazone, glipizide, others reviewed   DIETARY INTAKE:   Usual eating pattern includes 3 meals and 1-2 snacks per day.  24-hr recall:  B ( AM): Peanut butter, banana OR cheese toast with milk  Snk ( AM): None usually  L ( PM): Sandwich (roast beef, tomato), fruit, diet soda Snk ( PM): Occasionally, cheddar cheese D ( PM): Burrito, vegetables (carrots, cauliflower) OR chicken, greens, rice Snk ( PM): Graham crackers with peanut butter Beverages: Diet soda, water  Usual physical activity: Stationary biking 2-3 times weekly 10-25 minutes, goal 5 times a week 30 minutes  Estimated energy needs: 1200 calories 150 g carbohydrates 75 g protein 33 g fat  Progress Towards Goal(s):  In progress.   Nutritional Diagnosis:  [place]-3.3 Overweight/obesity As related to history of excessive energy intake.  As evidenced by BMI 37.1.    Intervention:  Nutrition counseling. We reviewed basic carb counting, including foods with carbs, label reading, portion size, and meal planning. We also discussed exercise and healthy weight loss.   Goals:  1. 2-3 carb servings at meals.  2. Include an evening snack to prevent overnight hypoglycemia.  3. Goal weight. 180  pounds 4. Continue to monitor portion size.  5. Bike 3 days a week (MWF) for 15-20 minutes.    Monitoring/Evaluation:  Dietary intake, exercise, blood glucose, and body weight prn.

## 2013-03-06 ENCOUNTER — Telehealth: Payer: Self-pay

## 2013-03-06 LAB — FECAL OCCULT BLOOD, IMMUNOCHEMICAL: Fecal Occult Blood: NEGATIVE

## 2013-03-06 NOTE — Telephone Encounter (Signed)
IFOB neg Pt notified

## 2013-03-06 NOTE — Addendum Note (Signed)
Addended by: Eliezer Bottom on: 03/06/2013 04:04 PM   Modules accepted: Orders

## 2013-03-18 ENCOUNTER — Telehealth: Payer: Self-pay | Admitting: Obstetrics & Gynecology

## 2013-03-18 NOTE — Telephone Encounter (Signed)
Patient is calling because she received a check in the mail for $20 and she is wanting to know if it is legit and if it is ok to cash it.

## 2013-04-06 ENCOUNTER — Ambulatory Visit (INDEPENDENT_AMBULATORY_CARE_PROVIDER_SITE_OTHER): Payer: 59 | Admitting: Family Medicine

## 2013-04-06 VITALS — BP 134/70 | HR 92 | Ht 61.0 in | Wt 193.2 lb

## 2013-04-06 DIAGNOSIS — E119 Type 2 diabetes mellitus without complications: Secondary | ICD-10-CM

## 2013-04-06 NOTE — Progress Notes (Signed)
Subjective:  Patient presents today for 3 month diabetes follow-up as part of the employer-sponsored Link to Wellness program. Current diabetes regimen includes glipizide 10 mg BID, Pioglitazone 30 mg daily, metformin 100 mg BID. Patient also continues on daily ASA, ACEi, and statin.   She reports that things have been going well. She had increased back pain starting in September and she didn't exercise for about a month. The back pain has improved lately and she started back on the exercise bike at home. Her goal is 30 minutes 5 times a week, but now she is up to 10-15 minutes at a time. Last week she made it 4 days.   Last appointment with Dr. Zachery Dauer was in June. Her next appointment is in December. Her last A1C in July was 6.7%.  Patient is seen today along with her husband, who is also in the Link to [employer]. I conducted their visit jointly.     Disease Assessments:  Diabetes:  Type of Diabetes: Type 2; Year of diagnosis 2002; Sees Diabetes provider 2 times per year; Diabetes Education Attended [facility] classes; MD managing Diabetes Juluis Rainier MD; checks blood glucose 2 times a day; takes medications as prescribed; uses glucometer Trueresult glucometer we dispensed; hypoglycemia frequency freq; takes an aspirin a day;   7 day CBG average 103; 14 day CBG average 110; 30 day CBG average 117; Highest CBG 198; Lowest CBG 55;   Other Diabetes History: She states she is checking CBG at least two times daily, sometimes three times daily. She states she is doing pretty good. On average fasting CBG are running between 75-120. She is having some hypoglycemia. She states it is usually because she isn't eating lunch. Since she has been more diligent with eating lunch she is not going low. She voiced understanding of how to correct for hypoglycemia. She is taking glucose tablets, drinking regular soda or eating a piece of fruit to correct for a low blood sugar.   She states she is more consistent  with taking blood sugar for the past few months. Since her last visit with the pharmacist her average CBGs are about 20-30 points lower.  Patient wanted her A1C checked today. It was 6.6%. Patient was pleased with the result. I will fax this to Dr. Zachery Dauer office.     Nutrition-  B- banana and peanut butter, sometimes cheese toast and milk, sometimes a breakfast burrito with [place] sausage. Sometimes she goes to [employer] and gets an egg mcmuffin.   L- eats lunch out to [employer] (goes out 1-2 times a week). She states that she can eat healthy there when she goes. She gets cantaloupe, fruits, greens, broccoli, chicken. When she doesn't go the [place] buffet she eats [employer] and will eat a hamburger and splits the french fries with her husband.  D- sometimes hot pocket, sometimes soup, sometimes hamburgers, salmon burgers  She estimates that she eats out 5-6 times a week to eat out. She states she averages once a day. She states that she is learning how to eat healthier and when she goes to [employer] she is eating salad with rotissierie chicken.   Physical Activity-  Prior to her back injury she was using an exercise bike for 30 minutes 5 days a week. She was unable to exercise for a few weeks but this past week she was able to start back and is up to 10 minutes 4 times a week.     04/06/2013 3:13 PM (EST) Blood  Pressure 134 / 70 mm/HgBMI 36.5; Height 5 ft 1 in; Pulse Rate 92 bpm; Weight 193.2 lbs    Testing:  Blood Sugar Tests:  Hemoglobin A1c: 6.6 resulted on 04/06/2013    Assessment/Plan: Patient is a 62 year old female with DM2. A1C today was 6.6% and is lower than her previous result of 6.9%. She is meeting glycemic goal of less than 7%. Patient attributes this drop to lowering portion sizes and also to cutting back on carbohydrate servings. 24 hour recall compared to last visit shows that she is eating less carbohydrates and overall portion sizes now. She was also able to  corrrectly identify all the carbohydrates she consumed based on her 24 hour recall.   Patient and her husband still continue to eat out almost daily. Patient seems to be making healthier decisions when she eats out and has increased vegetable intake. She is still eating fried foods and has not been able to cut back to just once a week. I explained that eating out often has more calories than what you are able to cook at home. I encouraged her to cook more at home rather than eating out.   Last month patient was having hypoglycemic episodes a few times a week. Her CBG log shows that her fasting CBG was averaging around 75-85. The past month has improved and she is not going low as often. This might be because patient was unable to exercise for the past several weeks and her blood sugar was elevated. I cautioned patient that as she increased physical activity to be on the lookout for low blood sugars. If they are consistently low again, I advised her to contact Dr. Zachery Dauer, as her glipizide dose may need to be lowered.   Follow up with patient in 3 months. .   Goals for Next Visit-  1. Take glipizde dose with dinner.  2. Continue physical activity and build up as you can tolerate.  3. Continue making healthy eating choices and continue to cut back on fried foods.  4. Try to and eat home more often.  Next appointment to see me is Friday January 23rd at 2:30 PM.

## 2013-07-20 ENCOUNTER — Ambulatory Visit (INDEPENDENT_AMBULATORY_CARE_PROVIDER_SITE_OTHER): Payer: Self-pay | Admitting: Family Medicine

## 2013-07-20 VITALS — BP 135/70 | Ht 60.0 in | Wt 197.6 lb

## 2013-07-20 DIAGNOSIS — E119 Type 2 diabetes mellitus without complications: Secondary | ICD-10-CM

## 2013-07-20 NOTE — Progress Notes (Signed)
Subjective:  Patient presents today for 3 month diabetes follow-up as part of the employer-sponsored Link to Wellness program. Current diabetes regimen includes glipizide 10 mg once daily, metformin 1000 mg twice daily, pioglitazone 30 mg once daily. Patient also continues on daily ASA, ACEi, and statin.   She last saw Dr. Jyl HeinzBanres on 06/05/13. Most recent A1C was 6.8%. At that visit glipizide was decreased to once daily because patient was having frequent hypoglycemia (3-4 times a week). Patient reports that since she has decreased to once a day she feels much better.   She has a pending appointment in March with Dr. Zachery DauerBarnes.    Disease Assessments:  Diabetes:  Type of Diabetes: Type 2; Year of diagnosis 2002; Sees Diabetes provider 2 times per year; [facility] classes; MD managing Diabetes Juluis RainierElizabeth Barnes MD; checks blood glucose 2 times a day; takes medications as prescribed; uses glucometer Trueresult glucometer we dispensed; takes an aspirin a day; 7 day   CBG average 104; 14 day CBG average 101; What is target A1c? %; 30 day CBG average 106; hypoglycemia frequency rarely; Highest CBG 205; Lowest CBG 55; Other Diabetes History: Patient checks once or twice daily. She is checking around 10 AM before she has anything to eat. When she checks in the evening it is between 5-6 PM. This is pre-meal. If it is high then she checks it again.   Since she decreased glipizide to once daily she has had a low blood sugar once.     Nutrition-  B- drinking coffee with a little bit of milk; cheese toast,   L- Eats out most days for lunch. She states that she is trying to watch what she eats when she goes out to eat. She is trying to eat fish and is trying to avoid the fried fish. Her normal places for lunch are the [employer]- she gets greens, a small amount of rice, baked chicken, fruit. She sometimes eats the fries or baked potatoes, and when she does that she isn't eating as much  rice.   D- sometimes a burrito; chicken pot pies; sometimes she will eat a hot pocket. She will eat an apple with dinner too.   Physical Activity-  She using the exercise bike 5 days a week, usually for 15 minutes each time. She states this last week she hasn't done as good because she has a cold. She is hoping to increase to 20 or 30 minutes each time.       Preventive Care:    Hemoglobin A1c: 06/05/2013 Via Dr. Zachery DauerBarnes 6.8     Colonoscopy: 01/07/2006 redo in 5 years  Dilated Eye Exam: 11/01/2012  Flu vaccine: 06/05/2013  Foot Exam: 07/10/2011 self exam WNL  Last mammography: 06/11/2011  Pap Smear: 01/04/2012  Pneumovax: 01/05/2002  Other Preventive Care Notes: Dental Visit- pending for January 2015       Vital Signs:  07/17/2013 3:12 PM (EST) Blood Pressure 135 / 70 mm/HgBMI 38.6; Height 5 ft 0 in; Weight 197.6 lbs    Testing:  Blood Sugar Tests:  Hemoglobin A1c: 6.8 Via Dr. Zachery DauerBarnes resulted on 06/05/2013    Historical Lab Results:  Total Cholesterol 145  via dr. Zachery Dauerbarnes office 05/2013; Triglycerides 109; LDL Cholesterol 69; HDL Cholesterol 54     Assessment/Plan: Patient is a 63 year old female with DM2. Most recent A1C with Dr. Zachery DauerBarnes was 6.8% and is slightly increased from last result of 6.6%, but overall is still meeting goal of less than  7%. Glipizide was recently decreased from 10 mg twice daily to once daily. Meter review shows that since this change she is no longer having frequent hypoglycemic episodes and fasting CBG averages are around 100.   Patient has made some changes since her last visit with me. She states that she has been decreasing portion sizes and is avoiding fried foods. She states that she has developed a taste for baked or broiled fish and actually likes it better than the fried fish. She is still eating out for lunch daily and is not cooking much at home. We spent part of the visit reviewing nutrition information for the foods she is eating for  dinner (hot pockets, burritos, chicken pot pie, etc.). We reviewed what her target carbohydrate goals were for each meal and snack and how to read a food label.   Patient is still exercising but admits she hasn't been as strict with it in the past couple of weeks because she has been sick. Now that she is feeling better I encouraged her to get back into the habit of exercising.   Patient will follow up with Morrie Sheldon in 3 months. Follow up with A1C to see if it increased after cutting back on glipizide dose..    Goals for Next Visit-  1. Cutting back on french fries to no more than once a week.  2. Continue to limit fried foods in general.  3. Continue using the exercise bike at least 5 days a week and try to work up to 20 minutes at a time.  4. Watch your portion sizes with rice especially.  Next visit is with Morrie Sheldon on Thursday May 7th at 2:30 PM.

## 2013-07-30 NOTE — Progress Notes (Signed)
Patient ID: Kayla Brown, female   DOB: 06/03/1951, 62 y.o.   MRN: [id number] ATTENDING PHYSICIAN NOTE: I have reviewed the chart and agree with the plan as detailed above. Sara Neal MD Pager [phone]  

## 2013-10-15 NOTE — Progress Notes (Signed)
Patient ID: Kayla SohoVickie S Harold, female   DOB: 04/25/1951, 63 y.o.   MRN: [id number] ATTENDING PHYSICIAN NOTE: I have reviewed the chart and agree with the plan as detailed above. Denny LevySara Darriel Sinquefield MD Pager [phone]

## 2013-11-12 ENCOUNTER — Ambulatory Visit (INDEPENDENT_AMBULATORY_CARE_PROVIDER_SITE_OTHER): Payer: Self-pay | Admitting: Family Medicine

## 2013-11-12 DIAGNOSIS — E119 Type 2 diabetes mellitus without complications: Secondary | ICD-10-CM

## 2013-11-12 NOTE — Progress Notes (Signed)
Patient presents today for 3 month diabetes follow-up as part of the employer-sponsored Link to Wellness program. Current diabetes regimen includes Metformin, glipizide, and pioglitazone. Patient also continues on daily ASA, ACEi, and statin. Most recent MD follow-up was March 2015. At this time glipizide was decreased from twice daily to once daily due to hypoglycemia. Patient has a pending appt for June 2015 to follow-up. No significant health changes at this time.  Diabetes Assessment: Type of Diabetes: Type 2; Year of diagnosis 2002; Sees Diabetes provider 2 times per year; [facility]; MD managing Diabetes Juluis RainierElizabeth Barnes MD; checks blood glucose 2 times a day; takes medications as prescribed; uses glucometer Trueresult glucometer we dispensed; takes an aspirin a day; What is target A1c? %; hypoglycemia frequency rarely; 7 day CBG average 107; 30 day CBG average 120; 14 day CBG average 109; Highest CBG 214; Lowest CBG 49; A1. 6.9 Other Diabetes History: Current med regimen includes Metformin ER 500 mg twice daily, glipizide er 10 mg daily, pioglitazone 30 mg daily. Patient continues tolerating medications well and maintains good medication compliance. Patient did bring meter today and is currently testing 1-2 times per day. Glucose monitoring occurs fasting, when symptomatic, and usually once throughout the day. Readings generally 90-140s, but sometimes as low as 50s and as high as >200. Hypoglycemia frequency is occassional. Patient does demonstrate appropriate correction of hypoglycemia. Patient denies signs and symptoms of neuropathy including numbness/tingling/burning and symptoms of foot infection. Patient is due for yearly eye exam and has appt scheduled for July.  Lifestyle Factors: Nutrition-  Patient continues to struggle with certain areas of diet but reports that she is working hard to make changes and is noticing a change in her food choices and cravings. Fried  foods continues to be her weakness, she was having french fries multiple times per week, and is now limiting to 1-2 times weekly. She is also limiting fried fish and chicken. She continues to decrease portion sizes, and is making healthier choices overall.  Breakfast - dannon, triple zero greek yogurt with banana, sometimes breakfast burrito and banana [employer] buffett - trying to discipline regarding portion size and choosing more vegetable options and salads Physical Activity- Patient is doing great with this and is now exercising 25 minutes, 4-5 times per week on stationary bike. She has a goal of 30 min 5 times per week.   Assessment: Patient presents today for 3 month follow-up. A1c has improved and has decreased from 7.0 in march to 6.9 today. She has made significant changes to exercise and is now exercising approximately 100 minutes per week with goal of 150 min/week. She is also attempting to make dietary changes and has limited fried foods and portion sizes, but will continue to work on this area. Of note, patient has experienced occassional hypoglycemia but this appears to be improving. She will follow-up in 3 months.  Plan: 1) Continue making healthy dietary choices, manage portion sizes and limit fried foods 2) Continue exercising, great job with this! Goal of 150 minutes per week 3) Continue testing 4) Follow-up in 3 months with Aundra MilletMegan, Pharmacist on Monday, August 24th @ 2:30 pm

## 2013-11-24 ENCOUNTER — Other Ambulatory Visit: Payer: Self-pay

## 2013-11-24 DIAGNOSIS — Z1231 Encounter for screening mammogram for malignant neoplasm of breast: Secondary | ICD-10-CM

## 2013-12-07 ENCOUNTER — Telehealth: Payer: Self-pay | Admitting: Family Medicine

## 2013-12-10 ENCOUNTER — Ambulatory Visit
Admission: RE | Admit: 2013-12-10 | Discharge: 2013-12-10 | Disposition: A | Discharge status: Home / Self Care | Payer: 59 | Source: Ambulatory Visit

## 2013-12-10 DIAGNOSIS — Z1231 Encounter for screening mammogram for malignant neoplasm of breast: Secondary | ICD-10-CM

## 2013-12-16 NOTE — Telephone Encounter (Signed)
Called patient to come in for lab work. Please schedule for LDL and A1C test as part of pt diabetes care.  ° °

## 2014-02-09 ENCOUNTER — Encounter: Payer: Self-pay | Admitting: Family Medicine

## 2014-02-09 NOTE — Progress Notes (Signed)
Patient ID: Kayla Brown, female   DOB: 07/03/1950, 63 y.o.   MRN: [id number] Reviewed: Agree with the documentation and management of our pharmacologist.

## 2014-02-19 ENCOUNTER — Ambulatory Visit (INDEPENDENT_AMBULATORY_CARE_PROVIDER_SITE_OTHER): Payer: Self-pay | Admitting: Family Medicine

## 2014-02-19 VITALS — BP 136/64 | Ht 61.0 in | Wt 189.0 lb

## 2014-02-19 DIAGNOSIS — E119 Type 2 diabetes mellitus without complications: Secondary | ICD-10-CM

## 2014-02-19 DIAGNOSIS — E1165 Type 2 diabetes mellitus with hyperglycemia: Secondary | ICD-10-CM

## 2014-02-19 NOTE — Progress Notes (Signed)
Subjective:  Patient presents today for 3 month diabetes follow-up as part of the employer-sponsored Link to Wellness program. Current diabetes regimen includes glipizide ER 10 mg once daily, metformin 500 mg ER 2 tablets twice daily, pioglitazone 30 mg once daily. Patient also continues on daily ASA, ACEi, and statin. No major health changes at this time. Has stopped taking B Complex and Folic Acid. No other medication changes. Patient states that she has been having dizzy spells and is falling on ocassion. She fell on Saturday off the toilet. After she got up it was 149/65, pulse 88; CBG was 179. She is checking her blood pressure once a week at home. She states on average it is in the 130/80s. She also states that she is having difficulty with constipation and wonders if that could contribute to the dizziness. The day she fell off the toilet she was straining with a bowel movement. She states that dizzy spells don't happen often- maybe once every 4-6 weeks. She is on 4 blood pressure medications. I asked her about the conditions of her house- she states that everything is on one level. She also states that she keeps things picked up off the floor so she won't trip on things.   Disease Assessments:  Diabetes: Type of Diabetes: Type 2; Year of diagnosis 2002; Sees Diabetes provider 2 times per year; Diabetes Education Attended [facility] classes; MD managing Diabetes Juluis Rainier MD; checks blood glucose 2 times a day; takes medications as prescribed; uses glucometer Trueresult glucometer we dispensed; takes an aspirin a day; What is target A1c? %; hypoglycemia frequency rarely;   7 day CBG average 117; 14 day CBG average 106; 30 day CBG average 108; Highest CBG 205; Lowest CBG 57; Other Diabetes History:   Current med regimen includes Metformin ER 500 mg twice daily, glipizide er 10 mg daily, pioglitazone 30 mg daily.  Patient is checking 1-2 times daily. Patient states she has had 2 low blood sugars in  the past month (around 60). She states that she corrects with glucose tablets or regular soda. A1C today was 6.4% and is a decrease from 7.3%. She states that she has made changes to the way that she is eating (cutting back on portion sizes) and she has increased physical activity.  CBG averages are similar to last visit. fasting CBG80-130; most are around 90-100; before dinner-58-205, average is about 120-140.          Tobacco Assessment: Smoking Status: Never smoker; Last Reviewed: 02/15/2014   Social History:  Exercise adherence 3-4 days a week; Medication adherence adherent; Diet adherence 25-50% of the time; Marital status: Married; Exercise habits: 30 mins week Stationary bicycle rideing presently 3X week; Denies alcohol use; Caffeine use: tea cola 2 per day Daily; Patient can afford medications; Patient knows the purpose/use of medications; 100 minutes of exercise per week.      Nutrition- Patient states that she still struggles with eating fried foods. She is eating three times a week. She states that she is trying to watch it and not eat too much.  Still going to [place] once a week. She is getting roasted chicken, greens, small serving of rice, baked french fries, fruit.  B-banana, small amount of PB; almond milk, piece of toast, cheese, sometimes PB on bread L- going out to eat for lunch 3-4 times a week. [place], [place] (sandwich and french fries), [employer] (getting 1/4 pounder, sometimes french fries),  D- eating at home. sometimes crock pot meals (cubed steak);  chicken, spaghetti. She tries to limit portion sizes of pasta.  Physical Activity- Patient is doing great with this and is now exercising 25 minutes, 4-5 times per week on stationary bike. She denies dizziness when she is exercising. She states that exercise is important to her and she wants to stay limber and flexible.  She is also touching her toes. She asked me if that would be OK. I cautioned her  because of her fall risk to do this while she is sitting down and after she finished on the exercise bike. She states that she had thought about doing chair exercises. We found a few videos on YouTube with chair exercises that she thought she could do.     Vital Signs:  02/15/2014 3:45 PM (EST)Blood Pressure 136 / 64 mm/HgBMI 35.7; Height 5 ft 1 in; Weight 189 lbs   Testing:  Blood Sugar Tests: Hemoglobin A1c: 6.4 via POCT resulted on 02/15/2014    Assessment/Plan: Patient is a 63 year old female with DM2. A1C today is 6.4% and is meeting goal of less than 7%. It is also lower than her last result of 7.3% in June 2015. Weight is also down today 9 pounds since her last appointment over the summer. Patient states that she has been more diligent with physical activity and has also tried to decrease portion sizes. She is still eating out frequently. One of her goals from last time was to cut back on french fry intake. She states that this is still a weakness for her but that she is improving. I am worried about dizziness in patient. She reports that it does not happen often (about once a month) but she states that she is falling when she gets dizzy. Patient states that she is checking BP and CBG when she is dizzy and both are usually within normal range. I told her that if the dizzy spells continue or start to become more frequent to call Dr. Zachery Dauer office and make an appointment. I also encouraged her to stop doing toe touches and instead sit on the floor and try to touch her toes to minimize fall risk. Patient has some concerns about constipation and states it is hard to have a bowel movement. She is having a BM every 2-3 days but states it is very hard and difficult to pass. She wanted to know if she could start Miralax. I recommended that she start with a half capful in 8 oz of water daily.       Goals for Next Visit- 1. Check your blood pressure at least once every day at the same time each  day. 2. If you experience another fall or if you have another dizzy spell and fall, call Dr. Zachery Dauer office for an appointment. 3. Stop taking Vitamin E. 4. Continue riding your bike 3-4 times a week. Try the chair exercises on YouTube twice a week. 5. Continue to have smaller portion sizes, especially with carbohydrates. Limit french fries to twice a week. Next appointment to see me is Monday November 30th at 2:30 PM.

## 2014-03-18 ENCOUNTER — Ambulatory Visit (INDEPENDENT_AMBULATORY_CARE_PROVIDER_SITE_OTHER): Payer: 59 | Admitting: Obstetrics & Gynecology

## 2014-03-18 ENCOUNTER — Encounter: Payer: Self-pay | Admitting: Obstetrics & Gynecology

## 2014-03-18 VITALS — BP 140/58 | HR 72 | Resp 25 | Ht 60.0 in | Wt 191.6 lb

## 2014-03-18 DIAGNOSIS — M949 Disorder of cartilage, unspecified: Secondary | ICD-10-CM

## 2014-03-18 DIAGNOSIS — Z79890 Hormone replacement therapy (postmenopausal): Secondary | ICD-10-CM | POA: Insufficient documentation

## 2014-03-18 DIAGNOSIS — Z124 Encounter for screening for malignant neoplasm of cervix: Secondary | ICD-10-CM

## 2014-03-18 DIAGNOSIS — M8580 Other specified disorders of bone density and structure, unspecified site: Secondary | ICD-10-CM

## 2014-03-18 DIAGNOSIS — E785 Hyperlipidemia, unspecified: Secondary | ICD-10-CM | POA: Insufficient documentation

## 2014-03-18 DIAGNOSIS — N951 Menopausal and female climacteric states: Secondary | ICD-10-CM

## 2014-03-18 DIAGNOSIS — I10 Essential (primary) hypertension: Secondary | ICD-10-CM | POA: Insufficient documentation

## 2014-03-18 DIAGNOSIS — M899 Disorder of bone, unspecified: Secondary | ICD-10-CM

## 2014-03-18 DIAGNOSIS — Z01419 Encounter for gynecological examination (general) (routine) without abnormal findings: Secondary | ICD-10-CM

## 2014-03-18 MED ORDER — ESTRADIOL 0.5 MG PO TABS: 0.5000 mg | ORAL_TABLET | Freq: Every day | ORAL | Status: DC

## 2014-03-18 MED ORDER — PROGESTERONE MICRONIZED 100 MG PO CAPS: 100.0000 mg | ORAL_CAPSULE | Freq: Every day | ORAL | Status: DC

## 2014-03-18 NOTE — Progress Notes (Signed)
63 y.o. G0P0000 MarriedCaucasianF here for annual exam.  No vaginal bleeding.  Doing well.  Seeing nutritionist.  HbA1C was 6.4 or 6.7 per pt.  Will see Dr. Zachery Dauer in October.    Patient's last menstrual period was 06/25/2008.          Sexually active: Yes.    The current method of family planning is post menopausal status.    Exercising: Yes.    bike Smoker:  no  Health Maintenance: Pap:  01/04/12 WNL/negative HR HPV History of abnormal Pap:  no MMG:  12/10/13-normal Colonoscopy:  2007-repeat in 10 years BMD:   2009, knows I want her to do it next year. TDaP:  4/11 Screening Labs: PCP, Hb today: PCP, Urine today: PCP   reports that she has never smoked. She has never used smokeless tobacco. She reports that she does not drink alcohol or use illicit drugs.  Past Medical History  Diagnosis Date  . Diabetes mellitus   . Pure hypercholesterolemia   . Essential hypertension, benign   . Major depressive disorder, single episode, unspecified     Past Surgical History  Procedure Laterality Date  . Dilation and curettage of uterus  2005-2008    x2  . Eye surgery      cataracts  . Wisdom tooth extraction      Current Outpatient Prescriptions  Medication Sig Dispense Refill  . AMLODIPINE BESYLATE PO Take 5 mg by mouth daily.      [name] aspirin 325 MG tablet Take 325 mg by mouth daily.        [name] atorvastatin (LIPITOR) 40 MG tablet Take 40 mg by mouth daily.        . Cholecalciferol (VITAMIN D PO) Take 4,000 Int'l Units by mouth.      . diltiazem (DILACOR XR) 240 MG 24 hr capsule Take 240 mg by mouth daily.        [name] estradiol (ESTRACE) 0.5 MG tablet Take 1 tablet (0.5 mg total) by mouth daily.  90 tablet  4  . fish oil-omega-3 fatty acids 1000 MG capsule Take 1 g by mouth daily.      . Garlic 500 MG TABS Take 500 mg by mouth daily.      [name] glipiZIDE (GLIPIZIDE XL) 10 MG 24 hr tablet Take 10 mg by mouth daily with breakfast.       . hydrochlorothiazide (MICROZIDE) 12.5 MG capsule Take 25 mg  by mouth daily.       [name] loratadine (CLARITIN) 10 MG tablet Take 10 mg by mouth as needed.       . metFORMIN (GLUCOPHAGE-XR) 500 MG 24 hr tablet Take 1,000 mg by mouth 2 (two) times daily before a meal.       . Multiple Vitamins-Minerals (VISION FORMULA) TABS Take 1 tablet by mouth every morning.       . pioglitazone (ACTOS) 30 MG tablet Take 30 mg by mouth daily.        . progesterone (PROMETRIUM) 200 MG capsule Take 1 capsule (200 mg total) by mouth daily. 1 daily per Dr. Hyacinth Meeker  90 capsule  4  . ramipril (ALTACE) 10 MG capsule Take 10 mg by mouth daily.        [name] venlafaxine (EFFEXOR-XR) 75 MG 24 hr capsule Take 75 mg by mouth daily.         No current facility-administered medications for this visit.    Family History  Problem Relation Age of Onset  . Adopted: Yes  .  Diabetes Mother     ROS:  Pertinent items are noted in HPI.  Otherwise, a comprehensive ROS was negative.  Exam:   BP 140/58  Pulse 72  Resp 25  Ht 5' (1.524 m)  Wt 191 lb 9.6 oz (86.909 kg)  BMI 37.42 kg/m2  LMP 06/25/2008  Weight change: -4#   Height: 5' (152.4 cm)  Ht Readings from Last 3 Encounters:  03/18/14 5' (1.524 m)  02/19/14  (1.549 m)  07/20/13 5' (1.524 m)    General appearance: alert, cooperative and appears stated age Head: Normocephalic, without obvious abnormality, atraumatic Neck: no adenopathy, supple, symmetrical, trachea midline and thyroid normal to inspection and palpation Lungs: clear to auscultation bilaterally Breasts: normal appearance, no masses or tenderness Heart: regular rate and rhythm Abdomen: soft, non-tender; bowel sounds normal; no masses,  no organomegaly Extremities: extremities normal, atraumatic, no cyanosis or edema Skin: Skin color, texture, turgor normal. No rashes or lesions Lymph nodes: Cervical, supraclavicular, and axillary nodes normal. No abnormal inguinal nodes palpated Neurologic: Grossly normal   Pelvic: External genitalia:  no lesions               Urethra:  normal appearing urethra with no masses, tenderness or lesions              Bartholins and Skenes: normal                 Vagina: normal appearing vagina with normal color and discharge, no lesions              Cervix: no lesions              Pap taken: Yes.   Bimanual Exam:  Uterus:  normal size, contour, position, consistency, mobility, non-tender              Adnexa: normal adnexa and no mass, fullness, tenderness               Rectovaginal: Confirms               Anus:  normal sphincter tone, no lesions  A:  Well Woman with normal exam PMP, on HRT.  Will lower dose further this year. Hypertension DB Elevated lipids Obesity Inner thigh yeast  P:   Mammogram yearly DMB with MMG next year pap smear today.  Neg pap with neg HR HPV 2013. Labs with Dr. Zachery Dauer Estradiol 0.5mg  pt will try taking 1/2 tab daily and Prometrium will be lowered to  dialy.  Rx to pharmacy return annually or prn  An After Visit Summary was printed and given to the patient.

## 2014-03-19 LAB — IPS PAP TEST WITH REFLEX TO HPV

## 2014-04-30 ENCOUNTER — Encounter: Payer: Self-pay | Admitting: Family Medicine

## 2014-04-30 NOTE — Progress Notes (Signed)
Patient ID: Kayla Brown, female   DOB: 07/27/1950, 63 y.o.   MRN: [id number] Reviewed: Agree with the documentation and management of our [facility].

## 2014-05-24 ENCOUNTER — Ambulatory Visit (INDEPENDENT_AMBULATORY_CARE_PROVIDER_SITE_OTHER): Payer: Self-pay | Admitting: Family Medicine

## 2014-05-24 VITALS — BP 128/60 | Wt 191.4 lb

## 2014-05-24 DIAGNOSIS — E119 Type 2 diabetes mellitus without complications: Secondary | ICD-10-CM

## 2014-05-24 NOTE — Assessment & Plan Note (Signed)
Subjective:  Patient presents today for 3 month diabetes follow-up as part of the employer-sponsored Link to Wellness program. Current diabetes regimen includes glipizide 10 mg ER once daily, metformin ER 1000 mg BID, Pioglitazone 30 mg daily. Patient also continues on daily ASA, ACEi, and statin. No major health changes at this time. Estradiol and progesterone doses were recently decreased at last OB-GYN visit.  She has recently started taking cetirizine. She was having issues with dizziness and had fluid behind her ears. The cetirizine has significantly helped with the dizziness and vertigo. Most recent A1C was 6.8% in October. She has an appointment pending for December 30th.      Diabetes: Type of Diabetes: Type 2; Year of diagnosis 2002; Sees Diabetes provider 2 times per year; Diabetes Education Attended [facility] classes; MD managing Diabetes Juluis RainierElizabeth Barnes MD; checks blood glucose 2 times a day; takes medications as prescribed; uses glucometer Trueresult glucometer we dispensed; takes an aspirin a day; What is target A1c? %; hypoglycemia frequency 2-3 times a week;   Lowest CBG 40; Highest CBG 24;   7 day CBG average 100; 14 day CBG average 103; 30 day CBG average 101;   Other Diabetes History:  Current med regimen includes Metformin ER 1000 mg twice daily, glipizide ER10 mg daily, pioglitazone 30 mg daily. She is checking blood sugar twice daily, once when she first wakes up and then again before dinner.  She is having more low blood sugars lately, several in the 40s-60s. She thinks that her blood sugars have been lower lately because she is not under as much stress. She states that sometimes her blood sugar is going low because she hasn't eaten as many carbohydrates. She hasn't been keeping a food log. She is correcting hypoglycemia by eating or drinking regular soda. She also sometimes will take glucose tablets. Then she checks it again in 30 minutes to see if it has come back up.  Fasting  CBGs are lower, and this is when she is having more hypoglycemia. She is not checking CBG before bedtime. Encouraged her to check before bedtime and to eat a snack if CBG is low.          Tobacco Assessment: Smoking Status: Never smoker; Last Reviewed: 05/24/2014   Social History:  Exercise adherence 3-4 days a week; Medication adherence adherent; Diet adherence 25-50% of the time; Marital status: Married; Exercise habits: 30 mins week Stationary bicycle rideing presently 10minutes 3X week; Denies alcohol use; Caffeine use: tea cola 2 per day Daily; Patient can afford medications; Patient knows the purpose/use of medications; 100 minutes of exercise per week.  Native language: EnglishRacial background: Caucasian  Occupation: homemaker  Nutrition- She states that things have been going pretty well. She is still going out to eat a few times a week. She is still eating fried foods three times a week. She states that she still struggles with that. She states that she is trying to watch what she is eating.  Physical Activity- While she was having issues with dizziness she stopped exercises. She is using the recumbent bicycle 20 minutes vigorously 4 times a week. This is an improvement from before. She is also touching her toes without getting dizzy. She reports no falls in the last few months.                                  Vital Signs:  05/24/2014 5:10 PM (EST)Blood Pressure  128 / 60 mm/HgBMI 36.2; Height 5 ft 1 in; Weight 191.4 lbs   Testing:  Blood Sugar Tests: Hemoglobin A1c: 6.8 via Dr. Zachery DauerBarnes resulted on 03/25/2014   Historical Lab Results:  LDL Cholesterol 69; HDL Cholesterol 54   Care Planning:  Learning Preference Assessment:  Learner: Patient, Family  Readiness to Learn Barriers: None  Teaching Method: Demonstration, Explanation, Handout  Evaluation of Learning: Can function independently and verbalize knowledge  Readiness to Change:  How important is your  health to you? 8  How confident are you in working to improve your health? 7  How ready are you to change to improve your health? 5  Total Score: 7  Care Plan:  05/24/2014 5:10 PM (EST) (1)  Problem: Low blood sugar in the morning  Role: Clinical Pharmacist  Long Term Goal Keep a log of blood sugar. When you have a low reading write down what is going on (eating, exercise, etc). Try to look for a pattern for when you are going low.  Date Started: 05/24/2014     Assessment/Plan: Patient is a 63 year old female with DM2. Most recent A1C was 6.8% which is meeting goal of less than 7%. This is an improvement from her last visit with me. Patient is having more hypoglycemic episodes however. Fasting CBGs are mainly lower- sometimes between 60-80. She states that when she does go low she is able to correct CBG by eating breakfast or by eating a few glucose tablets. I encouraged her to keep a written log of her blood sugar and to also write down what is happening when she does go low so that she can try and look for patterns. She has an appointment pending with PCP in a few weeks. Glipizide may need to be decreased if fasting CBG continues to trend lower.  Patient has made some strides with cutting back on fried foods. Weight is up 1 pound since last visit. Patient has been consistent with physical activity and is using the recumbent bicycle several times each week. She reports no dizziness which is also an improvement since last visit.  Follow up with patient in 3 months.  Goals for Next Visit- 1. Start checking blood sugar before you go to bed. Goal is 140. If blood sugar is lower than 120, eat a small snack. 2. Keep a log of your blood sugar so you can show Dr. Zachery DauerBarnes. When you have a blood sugar less that is less than 70, write down what you've had to eat or what you think caused the low blood sugar. 3. Continue to exercise at least four times a week for at least 20 minutes. Be consistent each week  with getting physical activity. 4. Limit fried food to twice a week.

## 2014-05-25 ENCOUNTER — Ambulatory Visit: Payer: Self-pay

## 2014-06-14 NOTE — Progress Notes (Signed)
Patient ID: Kayla Brown, female   DOB: 09/04/1950, 63 y.o.   MRN: [id number] Reviewed: Agree with the documentation and management of our [facility] Pharmacologist. 

## 2014-09-03 ENCOUNTER — Ambulatory Visit (INDEPENDENT_AMBULATORY_CARE_PROVIDER_SITE_OTHER): Payer: Self-pay | Admitting: Family Medicine

## 2014-09-03 VITALS — BP 148/70 | Wt 194.2 lb

## 2014-09-03 DIAGNOSIS — E119 Type 2 diabetes mellitus without complications: Secondary | ICD-10-CM

## 2014-09-03 NOTE — Assessment & Plan Note (Signed)
Subjective:  Patient presents today for 3 month diabetes follow-up as part of the employer-sponsored Link to Wellness program. Current diabetes regimen includes glipizide ER 5 mg daily, metformin 500 mg ER 2 tablets twice daily, pioglitazone 30 mg once daily. Patient also continues on daily ASA, ACEi, and statin. Most recent MD follow-up was with Dr. Drema Dallas in December. Most recent A1C was 6.9% at that visit. Patient has a pending appt for July 2016. At her last visit with Dr. Drema Dallas glipizide was decreased to 5 mg ER once daily.  Patient reports that she has been doing well. No falls lately.   Advanced Care Planning: Date Discussed 07/12/2011; Code Status FULL CODE  Disease Assessments:  Diabetes: Type of Diabetes: Type 2; Year of diagnosis 2002; Sees Diabetes provider 2 times per year; Diabetes Education Attended [facility] classes; MD managing Diabetes Leighton Ruff MD; checks blood glucose 2 times a day; takes medications as prescribed; uses glucometer Trueresult glucometer we dispensed; takes an aspirin a day; What is target A1c? %; Lowest CBG 40; Highest CBG 24; hypoglycemia frequency rarely;   7 day CBG average 116; 14 day CBG average 110; 30 day CBG average 113;   Other Diabetes History:  Current med regimen includes Metformin ER 1000 mg twice daily, glipizide ER 5 mg daily, pioglitazone 30 mg daily.  She is checking blood sugar twice daily, once when she first wakes up and then again before dinner.  Since glipizide was dropped to 5 mg she is not reporting as many hypoglycemia episodes. CBGs are on average 10 points higher than last visit.  She reports some low blood sugars in the evening, usually if she isn't eating dinner. I encouraged patient to make sure that she eat an evening meal every day. She is worried that if she takes metformin in the evening her blood sugar will go too low overnight. I explained that metformin doesn't usually cause a low blood sugar, but patient appeared very  anxious about taking metformin when she has a low blood sugar. I told patient if this happens again and she struggles to keep blood sugar above 100 she can skip the metformin dose in the evening.   Past Medical/Surgical History:  The patient has a past medical history of Hypertension, Diabetes Mellitus type 2, Hyperlipidemia.   Prior Surgery: Cataract  D&C   Tobacco Assessment: Smoking Status: Never smoker; Last Reviewed: 09/03/2014   Social History:  Exercise adherence 3-4 days a week; Medication adherence adherent; Diet adherence 25-50% of the time; Marital status: Married; Exercise habits: 30 mins week Stationary bicycle rideing presently 92mnutes 3X week; Denies alcohol use; Caffeine use: tea cola 2 per day Daily; Patient can afford medications; Patient knows the purpose/use of medications; 100 minutes of exercise per week.  Native language: EnglishRacial background: Caucasian  Occupation: homemaker  Nutrition- She reports no changes. She states that she is snacking at night. She is not eating much at dinner time and isn't really hungry at dinner time. But then later in the evening she states that she feels that she is not satisfied at night. She states that when she snacks on lite popcorn her blood sugar is OK in the morning.  Physical Activity- She is still using the recumbent bike. She is getting 20 minutes a few times a week. She is also doing some stretching exercises side to side and touching toes.  Vital Signs:  09/03/2014 4:57 PM (EST)Blood Pressure 148 / 70 mm/HgBMI 36.7; Height 5 ft 1 in; Weight 194.2 lbs   Testing:  Blood Sugar Tests: Hemoglobin A1c: 6.9 via [facility] resulted on 06/23/2014   Historical Lab Results:  LDL Cholesterol 69; HDL Cholesterol 54   Care Planning:  Learning Preference Assessment:  Learner: Patient, Family  Readiness to Learn Barriers: None  Teaching Method: Demonstration, Explanation, Handout   Evaluation of Learning: Can function independently and verbalize knowledge  Readiness to Change:  How important is your health to you? 8  How confident are you in working to improve your health? 7  How ready are you to change to improve your health? 5  Total Score: 7  Care Plan:  09/03/2014 4:57 PM (EST) (2)  Problem: Physical Activity  Role: Clinical Pharmacist  Long Term Goal Consistently get four days a week of physical activity for 20 minutes  Date Started: 09/03/2014  09/03/2014 4:57 PM (EST) (1)  Problem: Low blood sugar in the morning  Role: Clinical Pharmacist  Long Term Goal Keep a log of blood sugar. When you have a low reading write down what is going on (eating, exercise, etc). Try to look for a pattern for when you are going low.  Date Started: 05/24/2014  Goal Met glipizide was cut back to 5 mg and low blood sugars have stopped.  Date Met: 09/03/2014     Assessment/Plan: Patient is a 64 year old female with DM2. Most recent A1C was 6.9% in December and is meeting goal of less than 7%. At last visit patient was frequently having hypoglycemia and since glipizide was decreased to 5 mg daily hypoglycemia has mostly resolved. She reports that she has low blood sugars rarely now.  Patient is still struggling with fried foods and portion sizes overall. She is often skipping dinner and then snacking late into the night. Weight is up 3 pounds since her last visit with me. I recommended to patient that she eat an evening meal so that she isn't snacking after dinner. For some reason the last refill she obtained from the pharmacy was for the 10 mg glipizide tablets, not the 5 mg tablets. Dr. Drema Dallas decreased her dose in December. I told patient to find the bottle and either destroy the tablets or bring them back to me so that I can destroy them. I refilled the correct strength for patient today.  Follow up with patient in 4 months. Patient requested that she wait to see me until after  her appointment with Dr. Drema Dallas on July 13th.  Goals for Next Visit- 1. Find the 10 mg glipizide tablets and destroy them. You should be taking 5 mg glipizide tablets. 2. Make sure you eat a dinner meal. This will help you avoid late night snacking. 3. Use the recumbent bicycle for 20 minutes four times a week consistently. 4. If you have another day where you struggle to get your blood sugars above 100 throughout the whole day, make sure that you are eating regularly scheduled meals. You can skip your metformin dose for the evening to help you feel less anxious about a low blood sugar. Next appointment to see me is Monday July 25th at 2 PM.

## 2014-09-15 ENCOUNTER — Emergency Department (HOSPITAL_COMMUNITY)
Admission: EM | Admit: 2014-09-15 | Discharge: 2014-09-15 | Disposition: A | Discharge status: Home / Self Care | Payer: 59 | Source: Home / Self Care | Attending: Family Medicine | Admitting: Family Medicine

## 2014-09-15 ENCOUNTER — Encounter (HOSPITAL_COMMUNITY): Payer: Self-pay | Admitting: Emergency Medicine

## 2014-09-15 DIAGNOSIS — T148 Other injury of unspecified body region: Secondary | ICD-10-CM

## 2014-09-15 DIAGNOSIS — W57XXXA Bitten or stung by nonvenomous insect and other nonvenomous arthropods, initial encounter: Secondary | ICD-10-CM | POA: Diagnosis not present

## 2014-09-15 MED ORDER — DOXYCYCLINE HYCLATE 100 MG PO TABS: 200.0000 mg | ORAL_TABLET | Freq: Once | ORAL | Status: DC

## 2014-09-15 NOTE — ED Provider Notes (Addendum)
CSN: [id number]     Arrival date & time 09/15/14  0848 History   First MD Initiated Contact with Patient 09/15/14 [phone]     Chief Complaint  Patient presents with  . Tick Removal   (Consider location/radiation/quality/duration/timing/severity/associated sxs/prior Treatment) HPI  Tick bite. Left mid back. Occurred over the weekend while being outside. Area became itchy palpation first became aware. Husband tried removing it this morning but was only able to remove part of the tick. Denies fevers, joint pains, rash, headache, fever, palpitations, chest pain, shortness of breath, nausea, vomiting, diarrhea. Condition is not getting better nothing has alleviated her condition and symptoms are constant.     Past Medical History  Diagnosis Date  . Diabetes mellitus   . Pure hypercholesterolemia   . Essential hypertension, benign   . Major depressive disorder, single episode, unspecified    Past Surgical History  Procedure Laterality Date  . Dilation and curettage of uterus  2005-2008    x2  . Eye surgery      cataracts  . Wisdom tooth extraction     Family History  Problem Relation Age of Onset  . Adopted: Yes  . Diabetes Mother    History  Substance Use Topics  . Smoking status: Never Smoker   . Smokeless tobacco: Never Used  . Alcohol Use: No   OB History    Gravida Para Term Preterm AB TAB SAB Ectopic Multiple Living       Review of Systems Per HPI with all other pertinent systems negative.   Allergies  Review of patient's allergies indicates no known allergies.  Home Medications   Prior to Admission medications   Medication Sig Start Date End Date Taking? Authorizing Provider  AMLODIPINE BESYLATE PO Take 5 mg by mouth daily.   Yes Historical Provider, MD  aspirin 325 MG tablet Take 325 mg by mouth daily.     Yes Historical Provider, MD  atorvastatin (LIPITOR) 40 MG tablet Take 40 mg by mouth daily.     Yes Historical Provider, MD  cetirizine  (ZYRTEC) 10 MG tablet Take 10 mg by mouth daily.   Yes Historical Provider, MD  Cholecalciferol (VITAMIN D PO) Take 4,000 Int'l Units by mouth.   Yes Historical Provider, MD  diltiazem (DILACOR XR) 240 MG 24 hr capsule Take 240 mg by mouth daily.     Yes Historical Provider, MD  estradiol (ESTRACE) 0.5 MG tablet Take 1 tablet (0.5 mg total) by mouth daily. 03/18/14  Yes Jerene Bears, MD  glipiZIDE (GLUCOTROL XL) 5 MG 24 hr tablet Take 5 mg by mouth daily with breakfast.   Yes Historical Provider, MD  hydrochlorothiazide (MICROZIDE) 12.5 MG capsule Take 25 mg by mouth daily.    Yes Historical Provider, MD  metFORMIN (GLUCOPHAGE-XR) 500 MG 24 hr tablet Take 1,000 mg by mouth 2 (two) times daily before a meal.    Yes Historical Provider, MD  Multiple Vitamins-Minerals (VISION FORMULA) TABS Take 1 tablet by mouth every morning.    Yes Historical Provider, MD  pioglitazone (ACTOS) 30 MG tablet Take 30 mg by mouth daily.     Yes Historical Provider, MD  progesterone (PROMETRIUM) 100 MG capsule Take 1 capsule (100 mg total) by mouth daily. 03/18/14  Yes Jerene Bears, MD  ramipril (ALTACE) 10 MG capsule Take 20 mg by mouth daily.    Yes Historical Provider, MD  venlafaxine (EFFEXOR-XR) 75 MG 24 hr capsule Take  75 mg by mouth daily.     Yes Historical Provider, MD  doxycycline (VIBRA-TABS) 100 MG tablet Take 2 tablets (200 mg total) by mouth once. 09/15/14   Ozella Rocksavid J Seleen Walter, MD  fish oil-omega-3 fatty acids 1000 MG capsule Take 1 g by mouth daily.    Historical Provider, MD  Garlic 500 MG TABS Take 500 mg by mouth daily.    Historical Provider, MD   BP 143/81 mmHg  Temp(Src) 98.3 F (36.8 C) (Oral)  Resp 18  SpO2 98%  LMP 06/25/2008 Physical Exam Physical Exam  Constitutional: oriented to person, place, and time. appears well-developed and well-nourished. No distress.  HENT:  Head: Normocephalic and atraumatic.  Eyes: EOMI. PERRL.  Neck: Normal range of motion.  Cardiovascular: RRR, no m/r/g, 2+  distal pulses,  Pulmonary/Chest: Effort normal and breath sounds normal. No respiratory distress.  Abdominal: Soft. Bowel sounds are normal. NonTTP, no distension.  Musculoskeletal: Normal range of motion. Non ttp, no effusion.  Neurological: alert and oriented to person, place, and time.  Skin: left mid back with quarter-sized area of induration and erythema but nontender to palpation with central tick embedded in the skin. Psychiatric: normal mood and affect. behavior is normal. Judgment and thought content normal.   ED Course  FOREIGN BODY REMOVAL Date/Time: 09/15/2014 10:16 AM Performed by: Konrad DoloresMERRELL, Ellar Hakala J Authorized by: Konrad DoloresMERRELL, Kyheem Bathgate J Consent: Verbal consent obtained. Consent given by: patient Patient identity confirmed: verbally with patient Intake: left mid back. Patient sedated: no Patient restrained: no Complexity: simple 1 objects recovered. Objects recovered: 1 Post-procedure assessment: foreign body removed Patient tolerance: Patient tolerated the procedure well with no immediate complications Comments: With use of sterile 27-gauge needle and alligator forceps numerous attempts were required in order to remove remaining body and head which was deeply embedded in the patient's back with surrounding erythema and edema. No purulence noted. Nontender to palpation. Anabolic ointment and sterile bandage applied   (including critical care time) Labs Review Labs Reviewed - No data to display  Imaging Review No results found.   MDM   1. Tick bite with subsequent removal of tick    No current signs or symptoms of secondary infection with early ecchymosis, Lyme disease, R RMSF. However given duration of tick being embedded in the back for approximately 4 days we'll treat with one-time dose of doxycycline 200 mg. Patient given strict wound care precautions and instructions.   HTN: normotensive. Continue ucrrent regimen  Precautions given and all questions answered  Shelly Flattenavid  Jahliyah Trice, MD Family Medicine 09/15/2014, 10:20 AM     Ozella Rocksavid J Rahil Passey, MD 09/15/14 1020  Ozella Rocksavid J Jadan Rouillard, MD 09/15/14 1021

## 2014-09-15 NOTE — Discharge Instructions (Signed)
The tick that that she was removed in our clinic. Please continue to put antibiotic ointment on this area until the area scabs over. Please take the doxycycline antibiotic. This is a one-time dose to help prevent serious infection. Please come back if the area of the tick bite gets worse.

## 2014-09-15 NOTE — ED Notes (Signed)
Reports tick bite to lower back First noticed this am but believes it may have been there x2-3 days Alert, no signs of acute distress.

## 2014-09-28 NOTE — Progress Notes (Signed)
ATTENDING PHYSICIAN NOTE: I have reviewed the chart and agree with the plan as detailed above. Delaine Canter MD Pager [phone]  

## 2014-10-14 ENCOUNTER — Emergency Department (HOSPITAL_COMMUNITY)
Admission: EM | Admit: 2014-10-14 | Discharge: 2014-10-14 | Disposition: A | Discharge status: Home / Self Care | Payer: 59 | Source: Home / Self Care | Attending: Family Medicine | Admitting: Family Medicine

## 2014-10-14 ENCOUNTER — Emergency Department (INDEPENDENT_AMBULATORY_CARE_PROVIDER_SITE_OTHER): Payer: 59

## 2014-10-14 ENCOUNTER — Encounter (HOSPITAL_COMMUNITY): Payer: Self-pay | Admitting: Emergency Medicine

## 2014-10-14 DIAGNOSIS — M79671 Pain in right foot: Secondary | ICD-10-CM | POA: Diagnosis not present

## 2014-10-14 MED ORDER — KETOROLAC TROMETHAMINE 30 MG/ML IJ SOLN
30.0000 mg | Freq: Once | INTRAMUSCULAR | Status: AC
  Administered 2014-10-14: 30 mg via INTRAMUSCULAR

## 2014-10-14 MED ORDER — KETOROLAC TROMETHAMINE 30 MG/ML IJ SOLN
INTRAMUSCULAR | Status: AC
  Filled 2014-10-14: qty 1

## 2014-10-14 NOTE — ED Notes (Signed)
Right foot pain that started this am.  Patient did not fall or trip.  She reports stepping as she usually does, but had extreme pain in the foot.  Pedal pulse 2 +.  Able to move toes.

## 2014-10-14 NOTE — ED Provider Notes (Signed)
CSN: [id number]     Arrival date & time 10/14/14  1012 History   First MD Initiated Contact with Patient 10/14/14 1152     Chief Complaint  Patient presents with  . Foot Pain   (Consider location/radiation/quality/duration/timing/severity/associated sxs/prior Treatment) HPI Comments: No known injury. No hx of gout. Began while walking around household doing chores this morning. No previous episodes.   Patient is a 64 y.o. female presenting with lower extremity pain. The history is provided by the patient.  Foot Pain This is a new problem. The current episode started 1 to 2 hours ago. The problem occurs constantly. The problem has not changed since onset.   Past Medical History  Diagnosis Date  . Diabetes mellitus   . Pure hypercholesterolemia   . Essential hypertension, benign   . Major depressive disorder, single episode, unspecified    Past Surgical History  Procedure Laterality Date  . Dilation and curettage of uterus  2005-2008    x2  . Eye surgery      cataracts  . Wisdom tooth extraction     Family History  Problem Relation Age of Onset  . Adopted: Yes  . Diabetes Mother    History  Substance Use Topics  . Smoking status: Never Smoker   . Smokeless tobacco: Never Used  . Alcohol Use: No   OB History    Gravida Para Term Preterm AB TAB SAB Ectopic Multiple Living   0 0 0 0 0 0 0 0 0 0      Review of Systems  All other systems reviewed and are negative.   Allergies  Review of patient's allergies indicates no known allergies.  Home Medications   Prior to Admission medications   Medication Sig Start Date End Date Taking? Authorizing Provider  AMLODIPINE BESYLATE PO Take 5 mg by mouth daily.    Historical Provider, MD  aspirin 325 MG tablet Take 325 mg by mouth daily.      Historical Provider, MD  atorvastatin (LIPITOR) 40 MG tablet Take 40 mg by mouth daily.      Historical Provider, MD  cetirizine (ZYRTEC) 10 MG tablet Take 10 mg by mouth daily.     Historical Provider, MD  Cholecalciferol (VITAMIN D PO) Take 4,000 Int'l Units by mouth.    Historical Provider, MD  diltiazem (DILACOR XR) 240 MG 24 hr capsule Take 240 mg by mouth daily.      Historical Provider, MD  doxycycline (VIBRA-TABS) 100 MG tablet Take 2 tablets (200 mg total) by mouth once. 09/15/14   Ozella Rocksavid J Merrell, MD  estradiol (ESTRACE) 0.5 MG tablet Take 1 tablet (0.5 mg total) by mouth daily. 03/18/14   Jerene BearsMary S Miller, MD  fish oil-omega-3 fatty acids 1000 MG capsule Take 1 g by mouth daily.    Historical Provider, MD  Garlic 500 MG TABS Take 500 mg by mouth daily.    Historical Provider, MD  glipiZIDE (GLUCOTROL XL) 5 MG 24 hr tablet Take 5 mg by mouth daily with breakfast.    Historical Provider, MD  hydrochlorothiazide (MICROZIDE) 12.5 MG capsule Take 25 mg by mouth daily.     Historical Provider, MD  metFORMIN (GLUCOPHAGE-XR) 500 MG 24 hr tablet Take 1,000 mg by mouth 2 (two) times daily before a meal.     Historical Provider, MD  Multiple Vitamins-Minerals (VISION FORMULA) TABS Take 1 tablet by mouth every morning.     Historical Provider, MD  pioglitazone (ACTOS) 30 MG tablet Take 30 mg by mouth  daily.      Historical Provider, MD  progesterone (PROMETRIUM) 100 MG capsule Take 1 capsule (100 mg total) by mouth daily. 03/18/14   Jerene Bears, MD  ramipril (ALTACE) 10 MG capsule Take 20 mg by mouth daily.     Historical Provider, MD  venlafaxine (EFFEXOR-XR) 75 MG 24 hr capsule Take 75 mg by mouth daily.      Historical Provider, MD   BP 157/85 mmHg  Pulse 120  Temp(Src) 97.8 F (36.6 C) (Oral)  Resp 24  SpO2 97%  LMP 06/25/2008 Physical Exam  Constitutional: She is oriented to person, place, and time. She appears well-developed and well-nourished. No distress.  +obese  HENT:  Head: Normocephalic and atraumatic.  Eyes: Conjunctivae are normal.  Cardiovascular: Normal rate, regular rhythm and normal heart sounds.   On initial vitals patient appeared to have  tachycardia, however, during my exam HR was about 90 bpm  Pulmonary/Chest: Effort normal and breath sounds normal.  Musculoskeletal: Normal range of motion.       Feet:  Outlined area is region of pain with palpation, weight bearing and flexion of foot. No STS, deformity, skin changes. DP pulse intact. Foot pink and warm. Some mild tinea pedis in between toes.   Neurological: She is alert and oriented to person, place, and time.  Skin: Skin is warm and dry. No rash noted. No erythema.  Psychiatric: She has a normal mood and affect. Her behavior is normal.  Nursing note and vitals reviewed.   ED Course  Procedures (including critical care time) Labs Review Labs Reviewed - No data to display  Imaging Review No results found.   MDM   1. Right foot pain   Films as above. Patient given  IM injection of toradol while at [facility] and this provided her with relief. I suspect she has some plantar fasciitis. Given instructions regarding massage, stretching and RICE therapy for this condition and instruction her to follow up with PCP for PT or ortho referral should symptoms persist.     Ria Clock, PA 10/19/14 [phone]

## 2014-10-14 NOTE — Discharge Instructions (Signed)
Your xrays are normal. You may have a bit of plantar fasciitis. This is inflammation of the tissues of the bottom of your foot. Please use tylenol and/or ibuprofen as directed on packaging for discomfort and review instructions below for additional therapies. If symptoms persist or worsen, please follow up with your primary care doctor for continued evaluation.

## 2014-11-19 ENCOUNTER — Encounter: Payer: Self-pay | Admitting: Pharmacist

## 2015-01-17 ENCOUNTER — Ambulatory Visit: Payer: 59 | Admitting: Pharmacist

## 2015-01-17 ENCOUNTER — Ambulatory Visit (INDEPENDENT_AMBULATORY_CARE_PROVIDER_SITE_OTHER): Payer: Self-pay | Admitting: Family Medicine

## 2015-01-17 VITALS — BP 130/76 | Ht 60.0 in | Wt 198.6 lb

## 2015-01-17 DIAGNOSIS — E119 Type 2 diabetes mellitus without complications: Secondary | ICD-10-CM

## 2015-01-17 NOTE — Progress Notes (Signed)
Subjective:  Patient presents today for 3 month diabetes follow-up as part of the employer-sponsored Link to Wellness program.  Current diabetes regimen includes glipizide 5 mg XL once daily, metformin 500 mg ER 2 tablets (1000 mg) BID with meals, pioglitazone 30 mg once daily. Patient also continues on daily ASA, ACE Inhibitor and statin. No med changes or major health changes at this time.    Last appointment with Dr. Zachery Dauer was in July. A1C at that visit was 7.0%. Next appointment with her is in January.   Patient reports that she has been making some changes with her diet and is eating more salads.   Patient states she has a pending appointment to see a cardiologist for a heart murmur.    Assessment/Plan:  Patient is a 64 y.o. female with DM 2. Most recent A1C was   7.0% which is at goal of less than 7%. Weight is increased by 4 pounds from last visit with me.   CBG Review: Fasting CBG is usually in the 100-130s. 2-3 hour post prandial lunch readings are in the 160-170s.   1 episode of hypoglycemia, but otherwise it is rare.   High/Low- 176/55  Lifestyle improvements:  Physical Activity-  She continues to ride a stationary bike 3-4 times a week for 20 minutes.   Nutrition-  She is often eating a small dinner but then snacking late at night, usually 4 times a week. Snacking on crackers or cheese. One of her goals last time was to cut back on late night snacking but she doesn't feel like she has done very well with it.   She states that she has been trying to make healthier options and is eating salads more with lunch. She continues to eat out for several meals during the week. She likes eating french fries and states she is still eating them each week.     Follow up with me in 3 months.    Goals for Next Visit:  1. Eat a dinner meal at 5-6 PM so that you are not snacking later in the night when you are watching TV. If you get hungry later in the evening, eat a slice of  cheese.  2. Continue riding your bike 3-4 times a week for 20 minutes.  3. When you go out for [place] food look for healthier options like steamed vegetables, foods that aren't fried (like french fries or egg rolls).     Next appointment to see me is: Monday October 31st at 2 PM.    [name] D. Vivia Ewing, PharmD, BCPS, CDE [facility] Clinical Pharmacist & Diabetes Care Coordinator [phone]

## 2015-01-24 NOTE — Progress Notes (Signed)
Patient ID: Kayla Brown, female   DOB: [birth year], 64 y.o.   MRN: [id number] ATTENDING PHYSICIAN NOTE: I have reviewed the chart and agree with the plan as detailed above. Denny Levy MD Pager [phone]

## 2015-03-03 ENCOUNTER — Ambulatory Visit (INDEPENDENT_AMBULATORY_CARE_PROVIDER_SITE_OTHER): Payer: 59 | Admitting: Cardiology

## 2015-03-03 ENCOUNTER — Encounter: Payer: Self-pay | Admitting: Cardiology

## 2015-03-03 VITALS — BP 120/60 | HR 84 | Ht 60.0 in | Wt 202.4 lb

## 2015-03-03 DIAGNOSIS — E785 Hyperlipidemia, unspecified: Secondary | ICD-10-CM | POA: Diagnosis not present

## 2015-03-03 DIAGNOSIS — R011 Cardiac murmur, unspecified: Secondary | ICD-10-CM | POA: Insufficient documentation

## 2015-03-03 DIAGNOSIS — I10 Essential (primary) hypertension: Secondary | ICD-10-CM

## 2015-03-03 NOTE — Patient Instructions (Signed)
Medication Instructions:  Your physician recommends that you continue on your current medications as directed. Please refer to the Current Medication list given to you today.   Labwork: None  Testing/Procedures: Your physician has requested that you have a lexiscan myoview. For further information please visit [email or web address] Please follow instruction sheet, as given.   Your physician has requested that you have an echocardiogram. Echocardiography is a painless test that uses sound waves to create images of your heart. It provides your doctor with information about the size and shape of your heart and how well your heart's chambers and valves are working. This procedure takes approximately one hour. There are no restrictions for this procedure.  Follow-Up: Your physician recommends that you schedule a follow-up appointment AS NEEDED with Dr. Mayford Knife pending your test results.  Any Other Special Instructions Will Be Listed Below (If Applicable).

## 2015-03-03 NOTE — Progress Notes (Signed)
Cardiology Office Note   Date:  03/03/2015   ID:  Kayla Brown, DOB [birth year], MRN [id number]  PCP:  Gaye Alken, MD    Chief Complaint  Patient presents with  . New Evaluation    heart murmur      History of Present Illness: Kayla Brown is a 64 y.o. female who presents for evaluation of heart murmur.  She has a history of HTN, DM and dyslipidemia.  At last OV she was noted to have a systolic murmur and is hear for further evaluation.  She denies any chest pain, SOB, DOE, palpitations or syncope. She occasionally has some LE edema which is controlled on diuretics.  She occasionally gets dizzy when she gets up too fast.      Past Medical History  Diagnosis Date  . Diabetes mellitus   . Pure hypercholesterolemia   . Essential hypertension, benign   . Major depressive disorder, single episode, unspecified     Past Surgical History  Procedure Laterality Date  . Dilation and curettage of uterus  2005-2008    x2  . Eye surgery      cataracts  . Wisdom tooth extraction       Current Outpatient Prescriptions  Medication Sig Dispense Refill  . AMLODIPINE BESYLATE PO Take 5 mg by mouth daily.    [name] aspirin 325 MG tablet Take 325 mg by mouth daily.      [name] atorvastatin (LIPITOR) 40 MG tablet Take 40 mg by mouth daily.      . cetirizine (ZYRTEC) 10 MG tablet Take 10 mg by mouth daily.    . Cholecalciferol (VITAMIN D PO) Take 4,000 Int'l Units by mouth.    . diltiazem (DILACOR XR) 240 MG 24 hr capsule Take 240 mg by mouth daily.      [name] estradiol (ESTRACE) 0.5 MG tablet Take 0.25 mg by mouth daily.    . fish oil-omega-3 fatty acids 1000 MG capsule Take 1 g by mouth daily.    . Garlic 500 MG TABS Take 500 mg by mouth daily.    [name] glipiZIDE (GLUCOTROL XL) 5 MG 24 hr tablet Take 5 mg by mouth daily with breakfast.    . hydrochlorothiazide (MICROZIDE) 12.5 MG capsule Take 25 mg by mouth daily.     . metFORMIN (GLUCOPHAGE-XR) 500 MG 24 hr tablet Take  1,000 mg by mouth 2 (two) times daily before a meal.     . Multiple Vitamins-Minerals (VISION FORMULA) TABS Take 1 tablet by mouth every morning.     . pioglitazone (ACTOS) 30 MG tablet Take 30 mg by mouth daily.      . progesterone (PROMETRIUM) 100 MG capsule Take 1 capsule (100 mg total) by mouth daily. 90 capsule 4  . ramipril (ALTACE) 10 MG capsule Take 20 mg by mouth daily.     [name] venlafaxine (EFFEXOR-XR) 75 MG 24 hr capsule Take 75 mg by mouth daily.       No current facility-administered medications for this visit.    Allergies:   Review of patient's allergies indicates no known allergies.    Social History:  The patient  reports that she has never smoked. She has never used smokeless tobacco. She reports that she does not drink alcohol or use illicit drugs.   Family History:  The patient's family history includes Diabetes in her mother. She was adopted.    ROS:  Please see  the history of present illness.   Otherwise, review of systems are positive for none.   All other systems are reviewed and negative.    PHYSICAL EXAM: VS:  BP 120/60 mmHg  Pulse 84  Ht 5' (1.524 m)  Wt 202 lb 6.4 oz (91.808 kg)  BMI 39.53 kg/m2  LMP 06/25/2008 , BMI Body mass index is 39.53 kg/(m^2). GEN: Well nourished, well developed, in no acute distress HEENT: normal Neck: no JVD, carotid bruits, or masses Cardiac: RRR; no rubs, or gallops,no edema.  1/6 SM at LLSB Respiratory:  clear to auscultation bilaterally, normal work of breathing GI: soft, nontender, nondistended, + BS MS: no deformity or atrophy Skin: warm and dry, no rash Neuro:  Strength and sensation are intact Psych: euthymic mood, full affect   EKG:  EKG is ordered today. The ekg ordered today demonstrates NSR with no ST changes   Recent Labs: No results found for requested labs within last 365 days.    Lipid Panel No results found for: CHOL, TRIG, HDL, CHOLHDL, VLDL, LDLCALC, LDLDIRECT    Wt Readings from Last 3  Encounters:  03/03/15 202 lb 6.4 oz (91.808 kg)  01/17/15 198 lb 9.6 oz (90.084 kg)  09/03/14 194 lb 3.2 oz (88.089 kg)      ASSESSMENT AND PLAN:  1.  Heart murmur - I will get a 2D echo to assess. 2.  Multiple risk factors for CAD with DM, HTN and dyslipidemia - She has no angina -I will set her up for a Lexiscan myoview to rule out ischemia.  She cannot walk on a treadmill. 3.  HTN - controlled on amlodipine/Cardizem/HCTZ/ACE I 4.  Dyslipidemia 5.  DM - per PCP     Current medicines are reviewed at length with the patient today.  The patient has concerns regarding medicines.  The following changes have been made:  no change  Labs/ tests ordered today: See above Assessment and Plan No orders of the defined types were placed in this encounter.     Disposition:   FU with me PRN pending results of studies  Signed, Quintella Reichert, MD  03/03/2015 2:10 PM    [facility] [address] [place], [place], [place]  [address] Phone: [phone]; Fax: [phone]

## 2015-03-10 ENCOUNTER — Other Ambulatory Visit: Payer: Self-pay

## 2015-03-10 DIAGNOSIS — Z1231 Encounter for screening mammogram for malignant neoplasm of breast: Secondary | ICD-10-CM

## 2015-03-22 ENCOUNTER — Ambulatory Visit: Payer: 59 | Admitting: Certified Nurse Midwife

## 2015-03-23 ENCOUNTER — Telehealth (HOSPITAL_COMMUNITY): Payer: Self-pay | Admitting: *Deleted

## 2015-03-23 NOTE — Telephone Encounter (Signed)
Left message on voicemail per DPR in reference to upcoming appointment scheduled on 03/25/15 at 945 with detailed instructions given per Myocardial Perfusion Study Information Sheet for the test. LM to arrive 15 minutes early, and that it is imperative to arrive on time for appointment to keep from having the test rescheduled. If you need to cancel or reschedule your appointment, please call the office within 24 hours of your appointment. Failure to do so may result in a cancellation of your appointment, and a $50 no show fee. Phone number given for call back for any questions. Antionette Char, RN

## 2015-03-25 ENCOUNTER — Other Ambulatory Visit: Payer: Self-pay

## 2015-03-25 ENCOUNTER — Ambulatory Visit (HOSPITAL_COMMUNITY): Payer: 59 | Attending: Cardiology

## 2015-03-25 ENCOUNTER — Ambulatory Visit (HOSPITAL_BASED_OUTPATIENT_CLINIC_OR_DEPARTMENT_OTHER): Payer: 59

## 2015-03-25 DIAGNOSIS — I10 Essential (primary) hypertension: Secondary | ICD-10-CM | POA: Diagnosis not present

## 2015-03-25 DIAGNOSIS — E119 Type 2 diabetes mellitus without complications: Secondary | ICD-10-CM | POA: Diagnosis not present

## 2015-03-25 DIAGNOSIS — E785 Hyperlipidemia, unspecified: Secondary | ICD-10-CM | POA: Insufficient documentation

## 2015-03-25 DIAGNOSIS — R011 Cardiac murmur, unspecified: Secondary | ICD-10-CM

## 2015-03-25 DIAGNOSIS — I351 Nonrheumatic aortic (valve) insufficiency: Secondary | ICD-10-CM | POA: Diagnosis not present

## 2015-03-25 LAB — MYOCARDIAL PERFUSION IMAGING
LV dias vol: 81 mL
LV sys vol: 22 mL
Peak HR: 98 {beats}/min
RATE: 0.21
Rest HR: 69 {beats}/min
SDS: 2
SRS: 0
SSS: 2
TID: 1.08

## 2015-03-25 MED ORDER — TECHNETIUM TC 99M SESTAMIBI GENERIC - CARDIOLITE
10.7000 | Freq: Once | INTRAVENOUS | Status: AC | PRN
Start: 2015-03-25 — End: 2015-03-25
  Administered 2015-03-25: 11 via INTRAVENOUS

## 2015-03-25 MED ORDER — REGADENOSON 0.4 MG/5ML IV SOLN
0.4000 mg | Freq: Once | INTRAVENOUS | Status: AC
Start: 2015-03-25 — End: 2015-03-25
  Administered 2015-03-25: 0.4 mg via INTRAVENOUS

## 2015-03-25 MED ORDER — TECHNETIUM TC 99M SESTAMIBI GENERIC - CARDIOLITE
33.0000 | Freq: Once | INTRAVENOUS | Status: AC | PRN
  Administered 2015-03-25: 33 via INTRAVENOUS

## 2015-03-30 ENCOUNTER — Ambulatory Visit: Payer: 59

## 2015-03-30 ENCOUNTER — Telehealth: Payer: Self-pay | Admitting: Cardiology

## 2015-03-30 ENCOUNTER — Other Ambulatory Visit (INDEPENDENT_AMBULATORY_CARE_PROVIDER_SITE_OTHER): Payer: 59

## 2015-03-30 ENCOUNTER — Inpatient Hospital Stay: Admission: RE | Admit: 2015-03-30 | Payer: 59 | Source: Ambulatory Visit

## 2015-03-30 ENCOUNTER — Telehealth: Payer: Self-pay

## 2015-03-30 DIAGNOSIS — I272 Other secondary pulmonary hypertension: Secondary | ICD-10-CM | POA: Diagnosis not present

## 2015-03-30 DIAGNOSIS — Z01812 Encounter for preprocedural laboratory examination: Secondary | ICD-10-CM

## 2015-03-30 NOTE — Telephone Encounter (Signed)
error 

## 2015-03-30 NOTE — Telephone Encounter (Signed)
-----   Message from Quintella Reichert, MD sent at 03/25/2015 11:04 AM EDT ----- Unclear etiology of pulmonary HTN - please check PFTs and Chest CT angio to rule out chronic PE - if negative may need sleep study

## 2015-03-30 NOTE — Telephone Encounter (Signed)
Informed patient of results and verbal understanding expressed.  BMET, Chest CTA and PFTs ordered for scheduling Patient agrees with treatment plan.

## 2015-03-31 ENCOUNTER — Telehealth: Payer: Self-pay | Admitting: *Deleted

## 2015-03-31 LAB — BASIC METABOLIC PANEL
BUN: 31 mg/dL — ABNORMAL HIGH (ref 7–25)
CO2: 23 mmol/L (ref 20–31)
Calcium: 10 mg/dL (ref 8.6–10.4)
Chloride: 104 mmol/L (ref 98–110)
Creat: 0.97 mg/dL (ref 0.50–0.99)
Glucose, Bld: 155 mg/dL — ABNORMAL HIGH (ref 65–99)
Potassium: 4.8 mmol/L (ref 3.5–5.3)
Sodium: 140 mmol/L (ref 135–146)

## 2015-03-31 NOTE — Telephone Encounter (Signed)
Pt notified of lab results by phone with verbal understanding.  

## 2015-04-01 ENCOUNTER — Inpatient Hospital Stay: Admission: RE | Admit: 2015-04-01 | Payer: 59 | Source: Ambulatory Visit

## 2015-04-04 ENCOUNTER — Ambulatory Visit (HOSPITAL_COMMUNITY)
Admission: RE | Admit: 2015-04-04 | Discharge: 2015-04-04 | Disposition: A | Discharge status: Home / Self Care | Payer: 59 | Source: Ambulatory Visit | Attending: Cardiology | Admitting: Cardiology

## 2015-04-04 DIAGNOSIS — I272 Other secondary pulmonary hypertension: Secondary | ICD-10-CM | POA: Insufficient documentation

## 2015-04-04 LAB — PULMONARY FUNCTION TEST
DL/VA % pred: 82 %
DL/VA: 3.51 ml/min/mmHg/L
DLCO unc % pred: 68 %
DLCO unc: 12.85 ml/min/mmHg
FEF 25-75 Post: 2.48 L/sec
FEF 25-75 Pre: 1.75 L/sec
FEF2575-%Change-Post: 41 %
FEF2575-%Pred-Post: 124 %
FEF2575-%Pred-Pre: 87 %
FEV1-%Change-Post: 2 %
FEV1-%Pred-Post: 101 %
FEV1-%Pred-Pre: 98 %
FEV1-Post: 2.14 L
FEV1-Pre: 2.08 L
FEV1FVC-%Change-Post: 11 %
FEV1FVC-%Pred-Pre: 102 %
FEV6-%Change-Post: -7 %
FEV6-%Pred-Post: 91 %
FEV6-%Pred-Pre: 98 %
FEV6-Post: 2.42 L
FEV6-Pre: 2.61 L
FEV6FVC-%Change-Post: 0 %
FEV6FVC-%Pred-Post: 104 %
FEV6FVC-%Pred-Pre: 103 %
FVC-%Change-Post: -8 %
FVC-%Pred-Post: 87 %
FVC-%Pred-Pre: 95 %
FVC-Post: 2.42 L
FVC-Pre: 2.63 L
Post FEV1/FVC ratio: 88 %
Post FEV6/FVC ratio: 100 %
Pre FEV1/FVC ratio: 79 %
Pre FEV6/FVC Ratio: 99 %
RV % pred: 90 %
RV: 1.67 L
TLC % pred: 96 %
TLC: 4.31 L

## 2015-04-04 MED ORDER — ALBUTEROL SULFATE (2.5 MG/3ML) 0.083% IN NEBU
2.5000 mg | INHALATION_SOLUTION | Freq: Once | RESPIRATORY_TRACT | Status: AC
  Administered 2015-04-04: 2.5 mg via RESPIRATORY_TRACT

## 2015-04-05 ENCOUNTER — Ambulatory Visit (INDEPENDENT_AMBULATORY_CARE_PROVIDER_SITE_OTHER)
Admission: RE | Admit: 2015-04-05 | Discharge: 2015-04-05 | Disposition: A | Discharge status: Home / Self Care | Payer: 59 | Source: Ambulatory Visit | Attending: Cardiology | Admitting: Cardiology

## 2015-04-05 DIAGNOSIS — I272 Other secondary pulmonary hypertension: Secondary | ICD-10-CM

## 2015-04-05 MED ORDER — IOHEXOL 350 MG/ML SOLN
80.0000 mL | Freq: Once | INTRAVENOUS | Status: AC | PRN
  Administered 2015-04-05: 80 mL via INTRAVENOUS

## 2015-04-06 ENCOUNTER — Telehealth: Payer: Self-pay

## 2015-04-06 DIAGNOSIS — I2584 Coronary atherosclerosis due to calcified coronary lesion: Principal | ICD-10-CM

## 2015-04-06 DIAGNOSIS — I272 Pulmonary hypertension, unspecified: Secondary | ICD-10-CM

## 2015-04-06 DIAGNOSIS — I2510 Atherosclerotic heart disease of native coronary artery without angina pectoris: Secondary | ICD-10-CM

## 2015-04-06 NOTE — Telephone Encounter (Signed)
Informed patient of results and verbal understanding expressed.  Encouraged aggressive risk factor modification. FLP and BNP scheduled for Friday, April 07, 2016.  Sleep study ordered for scheduling. Patient agrees with treatment plan.

## 2015-04-06 NOTE — Telephone Encounter (Signed)
-----   Message from Quintella Reichertraci R Turner, MD sent at 04/05/2015  3:46 PM EDT ----- Chest CT with no PE.  There were coronary artery calcifications present but no ischemia on nuclear stress test.  She needs aggressive risk factor modification. Check FLP.  Continue statin/ASA.  Mild ground glass appearance of lungs ? Atelectasis vs. Edema.  Please have patient come in for BNP.  Please order split night sleep study to rule out OSA as etiology of pulmonary HTN.

## 2015-04-08 ENCOUNTER — Other Ambulatory Visit (INDEPENDENT_AMBULATORY_CARE_PROVIDER_SITE_OTHER): Payer: 59 | Admitting: *Deleted

## 2015-04-08 DIAGNOSIS — I272 Other secondary pulmonary hypertension: Secondary | ICD-10-CM

## 2015-04-08 DIAGNOSIS — I2584 Coronary atherosclerosis due to calcified coronary lesion: Secondary | ICD-10-CM | POA: Diagnosis not present

## 2015-04-08 DIAGNOSIS — I2510 Atherosclerotic heart disease of native coronary artery without angina pectoris: Secondary | ICD-10-CM | POA: Diagnosis not present

## 2015-04-08 LAB — BRAIN NATRIURETIC PEPTIDE: Brain Natriuretic Peptide: 25.5 pg/mL (ref 0.0–100.0)

## 2015-04-08 LAB — LIPID PANEL
Cholesterol: 122 mg/dL — ABNORMAL LOW (ref 125–200)
HDL: 58 mg/dL (ref >=46)
LDL Cholesterol: 47 mg/dL (ref <130)
Total CHOL/HDL Ratio: 2.1 Ratio (ref <=5.0)
Triglycerides: 85 mg/dL (ref <150)
VLDL: 17 mg/dL (ref <30)

## 2015-04-08 NOTE — Addendum Note (Signed)
Addended by: Tonita PhoenixBOWDEN, Levina Boyack K on: 04/08/2015 07:52 AM   Modules accepted: Orders

## 2015-04-08 NOTE — Addendum Note (Signed)
Addended by: Keiton Cosma K on: 04/08/2015 07:53 AM   Modules accepted: Orders  

## 2015-04-08 NOTE — Addendum Note (Signed)
Addended by: Tonita PhoenixBOWDEN, ROBIN K on: 04/08/2015 07:53 AM   Modules accepted: Orders

## 2015-04-12 ENCOUNTER — Encounter: Payer: Self-pay | Admitting: Cardiology

## 2015-04-12 NOTE — Telephone Encounter (Signed)
New  Message ° °Pt returned call  °

## 2015-04-12 NOTE — Telephone Encounter (Signed)
This encounter was created in error - please disregard.

## 2015-04-13 ENCOUNTER — Other Ambulatory Visit: Payer: Self-pay | Admitting: Obstetrics & Gynecology

## 2015-04-13 NOTE — Telephone Encounter (Signed)
Medication refill request: Prometrium  Last AEX:  03-18-14  Next AEX: 05-06-15  Last MMG (if hormonal medication request): 12-10-13 WNl scheduled for next MM 05-02-15  Refill authorized: please advise Sending to Dr. Edward JollySilva since Dr. Hyacinth MeekerMiller is on vacation

## 2015-04-13 NOTE — Telephone Encounter (Signed)
Dr. Hyacinth MeekerMiller will need to review patient's chart and determine if refill is appropriate. I see a possible diagnosis of pulmonary hypertension.   Cc - Dr. Hyacinth MeekerMiller

## 2015-04-21 ENCOUNTER — Telehealth: Payer: Self-pay | Admitting: Cardiology

## 2015-04-21 NOTE — Telephone Encounter (Signed)
New message ° ° ° ° ° °Calling to get test results °

## 2015-04-21 NOTE — Telephone Encounter (Signed)
Informed patient that PFTs have not been signed yet. Informed patient that the reading doctor was sent a message today to sign the results and patient will be called as soon as they are resulted. Patient grateful for call.

## 2015-04-25 ENCOUNTER — Ambulatory Visit (INDEPENDENT_AMBULATORY_CARE_PROVIDER_SITE_OTHER): Payer: Self-pay | Admitting: Family Medicine

## 2015-04-25 ENCOUNTER — Encounter: Payer: Self-pay | Admitting: Pharmacist

## 2015-04-25 DIAGNOSIS — E119 Type 2 diabetes mellitus without complications: Secondary | ICD-10-CM

## 2015-04-25 NOTE — Progress Notes (Signed)
Subjective:  Patient presents today for 3 month diabetes follow-up as part of the employer-sponsored Link to Wellness program.  Current diabetes regimen includes pioglitazone 30 mg once daily, metformin 1000 mg ER BID, glipizide ER 5 mg daily.   Patient also continues on daily ASA, ACE Inhibitor and statin.  No med changes at this time.   Her last visit with Dr. Zachery DauerBarnes was in July. A1C at that time was 7.0%. Next visit is in December.   She states that she has been under increased stress with a bed bug infestation and also taking care of her elderly mother in law who lives with them.   She states that she has been having issues with dizziness and shortness of breath. She is undergoing evaluation for pulmonary HTN.    Assessment:  Diabetes: Most recent A1C was 7.0  % which is at goal of less than 7%.    CBG Review: She is checking her blood sugar twice a day- usually fasting and then again before she eats dinner.   Per patient report she states that it is usually running between 120-150s. A few weeks ago it was running higher- in the 160s and 170s. She states that she thinks the numbers were higher because of stress and she was eating more at night. She states that in the last week the numbers have been coming down.   Self reported averages are about 10 points higher than at her last visit.   Denies hypoglycemia.   Lifestyle improvements:  Physical Activity-  She states that she exercised last week three times. She touches her toes and rides an exercise bike for 15-20 minutes. Even though she is busy she is trying to make sure that she continues to exercise. I encouraged her to continue exercising for stress reduction during this difficult time for her.   Nutrition-  She states that she isn't going out to eat as much as she is taking care of her elderly mother in law.   Eating soups. She states that she wants to cut out french fries as one of her goals for next visit.    Follow up  with me in 3 months.    Plan/Goals for Next Visit:  1. Starting this week, cut back to eating french fries once a week. In December cut them out all together.  2. Continue riding your bike 3-4 times a week for 20 minutes.  3. Eat dinner by 7 PM and no snacking after dinner.     Next appointment to see me is: Monday January 30th at 2 PM.    Aundra MilletMegan D. Vivia EwingWheatley, PharmD, BCPS, CDE [facility] Clinical Pharmacist & Diabetes Care Coordinator [phone]

## 2015-04-28 ENCOUNTER — Telehealth: Payer: Self-pay | Admitting: Cardiology

## 2015-04-28 NOTE — Telephone Encounter (Signed)
-----   Message from Quintella Reichertraci R Turner, MD sent at 04/26/2015  2:09 PM EDT ----- Reduced DLCO c/w mild pulmonary vascular process.  BNP was normal with diastolic dysfunction on echo.  Await results of sleep study. Please make sure this has been scheduled

## 2015-04-28 NOTE — Telephone Encounter (Signed)
New message ° ° ° ° °Returning a call to the nurse °

## 2015-04-28 NOTE — Telephone Encounter (Signed)
Informed patient of results and verbal understanding expressed.   Message sent to [facility] HospitalBethany, CMA to schedule.

## 2015-05-02 ENCOUNTER — Ambulatory Visit
Admission: RE | Admit: 2015-05-02 | Discharge: 2015-05-02 | Disposition: A | Discharge status: Home / Self Care | Payer: 59 | Source: Ambulatory Visit

## 2015-05-02 ENCOUNTER — Ambulatory Visit
Admission: RE | Admit: 2015-05-02 | Discharge: 2015-05-02 | Disposition: A | Discharge status: Home / Self Care | Payer: 59 | Source: Ambulatory Visit | Attending: Obstetrics & Gynecology | Admitting: Obstetrics & Gynecology

## 2015-05-02 DIAGNOSIS — M8580 Other specified disorders of bone density and structure, unspecified site: Secondary | ICD-10-CM

## 2015-05-02 DIAGNOSIS — Z1231 Encounter for screening mammogram for malignant neoplasm of breast: Secondary | ICD-10-CM

## 2015-05-05 ENCOUNTER — Other Ambulatory Visit: Payer: Self-pay | Admitting: Obstetrics & Gynecology

## 2015-05-05 DIAGNOSIS — R928 Other abnormal and inconclusive findings on diagnostic imaging of breast: Secondary | ICD-10-CM

## 2015-05-06 ENCOUNTER — Ambulatory Visit (INDEPENDENT_AMBULATORY_CARE_PROVIDER_SITE_OTHER): Payer: 59 | Admitting: Obstetrics & Gynecology

## 2015-05-06 ENCOUNTER — Encounter: Payer: Self-pay | Admitting: Obstetrics & Gynecology

## 2015-05-06 VITALS — BP 122/60 | HR 80 | Resp 16 | Ht 60.0 in | Wt 190.0 lb

## 2015-05-06 DIAGNOSIS — Z01419 Encounter for gynecological examination (general) (routine) without abnormal findings: Secondary | ICD-10-CM | POA: Diagnosis not present

## 2015-05-06 DIAGNOSIS — I272 Other secondary pulmonary hypertension: Secondary | ICD-10-CM

## 2015-05-06 MED ORDER — NYSTATIN-TRIAMCINOLONE 100000-0.1 UNIT/GM-% EX OINT: 1.0000 "application " | TOPICAL_OINTMENT | Freq: Two times a day (BID) | CUTANEOUS | Status: DC

## 2015-05-06 MED ORDER — PROGESTERONE MICRONIZED 100 MG PO CAPS: 100.0000 mg | ORAL_CAPSULE | Freq: Every day | ORAL | Status: DC

## 2015-05-06 MED ORDER — ESTRADIOL 0.5 MG PO TABS: 0.2500 mg | ORAL_TABLET | Freq: Every day | ORAL | Status: DC

## 2015-05-06 NOTE — Progress Notes (Signed)
64 y.o. G0P0000 MarriedCaucasianF here for annual exam.  Pt reports she is doing well.  Denies vaginal bleeding.   Pt reports she had a mildly elevated calcium level with PCP this summer in July.  Repeated in September and was normal per pt.  Do not have a copy of these labs.     Pt was seen in ER in March due to a tick bite and then in April due to foot pain.  Was diagnosed with plantar fasciitis.  She started doing stretches on her feet and this has really helped.    Diagnosed with pulmonary hypertension.  Has sleep study scheduled to assess for OSA.  Cardiologist is Dr. Armanda Magicraci Turner.  Pt asks me some questions about this that I answered to the best of my ability.    Patient's last menstrual period was 06/25/2008.          Sexually active: No.  The current method of family planning is post menopausal status.   Exercising: Yes.    some biking Smoker:  no  Health Maintenance: Pap:  03/18/14 WNL History of abnormal Pap:  no MMG:  05/02/15-MMG-additional views scheduled for 05/11/15 Colonoscopy:  2007-repeat in 10 years BMD:   05/02/15, -1.6/-1.6 TDaP:  4/11 Screening Labs: PCP, Hb today: PCP, Urine today: PCP   reports that she has never smoked. She has never used smokeless tobacco. She reports that she does not drink alcohol or use illicit drugs.  Past Medical History  Diagnosis Date  . Diabetes mellitus   . Pure hypercholesterolemia   . Essential hypertension, benign   . Major depressive disorder, single episode, unspecified [facility](HCC)     Past Surgical History  Procedure Laterality Date  . Dilation and curettage of uterus  2005-2008    x2  . Eye surgery      cataracts  . Wisdom tooth extraction      Current Outpatient Prescriptions  Medication Sig Dispense Refill  . AMLODIPINE BESYLATE PO Take 5 mg by mouth daily.    [name]. aspirin 325 MG tablet Take 325 mg by mouth daily.      [name]. atorvastatin (LIPITOR) 40 MG tablet Take 40 mg by mouth daily.      . cetirizine (ZYRTEC) 10 MG tablet  Take 10 mg by mouth daily.    . Cholecalciferol (VITAMIN D PO) Take 5,000 Int'l Units by mouth daily.     [name]. diltiazem (DILACOR XR) 240 MG 24 hr capsule Take 240 mg by mouth daily.      [name]. estradiol (ESTRACE) 0.5 MG tablet Take 0.25 mg by mouth daily.    . fish oil-omega-3 fatty acids 1000 MG capsule Take 1 g by mouth daily.    . Garlic 500 MG TABS Take 500 mg by mouth daily.    [name]. glipiZIDE (GLUCOTROL XL) 5 MG 24 hr tablet Take 5 mg by mouth daily with breakfast.    . hydrochlorothiazide (MICROZIDE) 12.5 MG capsule Take 25 mg by mouth daily.     . metFORMIN (GLUCOPHAGE-XR) 500 MG 24 hr tablet Take 1,000 mg by mouth 2 (two) times daily before a meal.     . Multiple Vitamins-Minerals (VISION FORMULA) TABS Take 1 tablet by mouth every morning.     . pioglitazone (ACTOS) 30 MG tablet Take 30 mg by mouth daily.      . progesterone (PROMETRIUM) 100 MG capsule TAKE 1 CAPSULE BY MOUTH DAILY. 90 capsule 0  . ramipril (ALTACE) 10 MG capsule Take 20 mg by mouth daily.     .[name]  venlafaxine (EFFEXOR-XR) 75 MG 24 hr capsule Take 75 mg by mouth daily.       No current facility-administered medications for this visit.    Family History  Problem Relation Age of Onset  . Adopted: Yes  . Diabetes Mother     ROS:  Pertinent items are noted in HPI.  Otherwise, a comprehensive ROS was negative.  Exam:   BP 122/60 mmHg  Pulse 80  Resp 16  Ht 5' (1.524 m)  Wt 190 lb (86.183 kg)  BMI 37.11 kg/m2  LMP 06/25/2008  Weight change: -1# Height: 5' (152.4 cm)  Ht Readings from Last 3 Encounters:  05/06/15 5' (1.524 m)  03/03/15 5' (1.524 m)  01/17/15 5' (1.524 m)   General appearance: alert, cooperative and appears stated age Head: Normocephalic, without obvious abnormality, atraumatic Neck: no adenopathy, supple, symmetrical, trachea midline and thyroid normal to inspection and palpation Lungs: clear to auscultation bilaterally Breasts: normal appearance, no masses or tenderness Heart: regular rate and  rhythm Abdomen: soft, non-tender; bowel sounds normal; no masses,  no organomegaly Extremities: extremities normal, atraumatic, no cyanosis or edema Skin: Skin color, texture, turgor normal. No rashes or lesions Lymph nodes: Cervical, supraclavicular, and axillary nodes normal. No abnormal inguinal nodes palpated Neurologic: Grossly normal   Pelvic: External genitalia:  no lesions              Urethra:  normal appearing urethra with no masses, tenderness or lesions              Bartholins and Skenes: normal                 Vagina: normal appearing vagina with normal color and discharge, no lesions              Cervix: no lesions              Pap taken: No. Bimanual Exam:  Uterus:  normal size, contour, position, consistency, mobility, non-tender              Adnexa: normal adnexa and no mass, fullness, tenderness               Rectovaginal: Confirms               Anus:  normal sphincter tone, no lesions  Chaperone was present for exam.  A:  Well Woman with normal exam PMP, on HRT. Will lower dose further this year. Hypertension Diabetes Mellitus Elevated lipids Obesity Osteopenia Pulmonary hypertension.  Having sleep study scheduled.   Skin candida  P: Mammogram yearly DMB with MMG next year Pap with neg HR HPV 2013.  Neg pap 2015.   Labs with Dr. Zachery Dauer Estradiol 0.5mg  pt will try taking 1/2 tab daily and Prometrium will be lowered to  dialy. Rx to pharmacy.  Reviewed risks with pt including DVT/PE, stroke, MI.  Pt desires to continue.  Pulmonary hypertension diagnosis reviewed with pt as well. PTH with calcium level today. Mycolog rx to pharmacy. return annually or prn

## 2015-05-09 LAB — PTH, INTACT AND CALCIUM
Calcium: 10.3 mg/dL (ref 8.4–10.5)
PTH: 54 pg/mL (ref 14–64)

## 2015-05-09 NOTE — Patient Outreach (Signed)
She states that she has been under increased stress with a bed bug infestation and also taking care of her elderly mother in law who lives with them. Because of the infestation I conducted this visit as a telephone call.   Because this was a telephone call I cannot evaluate weight gain.

## 2015-05-10 NOTE — Progress Notes (Signed)
I have reviewed this pharmacist's note and agree  

## 2015-05-11 ENCOUNTER — Ambulatory Visit
Admission: RE | Admit: 2015-05-11 | Discharge: 2015-05-11 | Disposition: A | Discharge status: Home / Self Care | Payer: 59 | Source: Ambulatory Visit | Attending: Obstetrics & Gynecology | Admitting: Obstetrics & Gynecology

## 2015-05-11 DIAGNOSIS — R928 Other abnormal and inconclusive findings on diagnostic imaging of breast: Secondary | ICD-10-CM

## 2015-05-15 ENCOUNTER — Ambulatory Visit (HOSPITAL_BASED_OUTPATIENT_CLINIC_OR_DEPARTMENT_OTHER): Payer: 59

## 2015-06-22 ENCOUNTER — Encounter (HOSPITAL_BASED_OUTPATIENT_CLINIC_OR_DEPARTMENT_OTHER): Payer: 59

## 2015-07-05 ENCOUNTER — Ambulatory Visit (HOSPITAL_BASED_OUTPATIENT_CLINIC_OR_DEPARTMENT_OTHER): Payer: 59 | Attending: Cardiology | Admitting: Radiology

## 2015-07-05 VITALS — Ht 60.0 in | Wt 195.0 lb

## 2015-07-05 DIAGNOSIS — R0683 Snoring: Secondary | ICD-10-CM | POA: Diagnosis not present

## 2015-07-05 DIAGNOSIS — I272 Other secondary pulmonary hypertension: Secondary | ICD-10-CM | POA: Diagnosis not present

## 2015-07-05 DIAGNOSIS — R5383 Other fatigue: Secondary | ICD-10-CM | POA: Insufficient documentation

## 2015-07-05 DIAGNOSIS — I493 Ventricular premature depolarization: Secondary | ICD-10-CM | POA: Insufficient documentation

## 2015-07-05 DIAGNOSIS — Z79899 Other long term (current) drug therapy: Secondary | ICD-10-CM | POA: Insufficient documentation

## 2015-07-05 DIAGNOSIS — G4733 Obstructive sleep apnea (adult) (pediatric): Secondary | ICD-10-CM | POA: Insufficient documentation

## 2015-07-05 DIAGNOSIS — Z7984 Long term (current) use of oral hypoglycemic drugs: Secondary | ICD-10-CM | POA: Diagnosis not present

## 2015-07-05 DIAGNOSIS — E119 Type 2 diabetes mellitus without complications: Secondary | ICD-10-CM | POA: Diagnosis not present

## 2015-07-11 ENCOUNTER — Telehealth: Payer: Self-pay | Admitting: Cardiology

## 2015-07-11 DIAGNOSIS — G4733 Obstructive sleep apnea (adult) (pediatric): Secondary | ICD-10-CM | POA: Insufficient documentation

## 2015-07-11 NOTE — Telephone Encounter (Signed)
Please let patient know that they have sleep apnea but could did not have adequate CPAP titration due to lack of time during study and  recommend CPAP titration full night study. Please set up titration in the sleep lab.

## 2015-07-11 NOTE — Sleep Study (Signed)
Patient Name: Kayla Brown, Kayla Brown MRN: [id number] Study Date: 07/05/2015 Gender: Female D.O.B: 1950-08-25 Age (years): 46 Referring Provider: Fransico Him MD, ABSM Interpreting Physician: Fransico Him MD, ABSM RPSGT: Carolin Coy  Weight (lbs): 195 BMI: 38 Height (inches): 60 Neck Size: 14.00  CLINICAL INFORMATION Sleep Study Type: Split Night CPAP Indication for sleep study: Diabetes, Fatigue, Hypertension, OSA, Snoring  SLEEP STUDY TECHNIQUE As per the AASM Manual for the Scoring of Sleep and Associated Events v2.3 (April 2016) with a hypopnea requiring 4% desaturations. The channels recorded and monitored were frontal, central and occipital EEG, electrooculogram (EOG), submentalis EMG (chin), nasal and oral airflow, thoracic and abdominal wall motion, anterior tibialis EMG, snore microphone, electrocardiogram, and pulse oximetry. Continuous positive airway pressure (CPAP) was initiated when the patient met split night criteria and was titrated according to treat sleep-disordered breathing.  MEDICATIONS Medications taken by the patient : amlodipine, ASA, Zyrtec, atorvastatin, Cardizem, Estrace, Vit D, Fish oil, glucotrol, HCTZ, Metformin, Actos, Prometrium, Altace, Effexor. Medications administered by patient during sleep study : No sleep medicine administered.  RESPIRATORY PARAMETERS Diagnostic Total AHI (/hr): 36.9  RDI (/hr):65.8   OA Index (/hr): 1.5  CA Index (/hr): 0.0 REM AHI (/hr): N/A  NREM AHI (/hr):36.9  Supine AHI (/hr):61.8  Non-supine AHI (/hr):11.53 Min O2 Sat (%):84.00  Mean O2 (%): 89.83  Time below 88% (min):34.9      Titration Optimal Pressure (cm):N/A  AHI at Optimal Pressure (/hr):N/A Min O2 at Optimal Pressure (%): 80.00 Supine % at Optimal (%):N/A  Sleep % at Optimal (%):N/A      SLEEP ARCHITECTURE The recording time for the entire night was 419.6 minutes. During a baseline period of 236.9 minutes, the patient slept for 157.7 minutes in REM and  nonREM, yielding a reduced sleep efficiency of 66.5%. Sleep onset after lights out was 31.8 minutes. The patient spent 17.87% of the night in stage N1 sleep, 82.13% in stage N2 sleep, 0.00% in stage N3 and 0.00% in REM. During the titration period of 178.7 minutes, the patient slept for 147.0 minutes in REM and nonREM, yielding a reduced sleep efficiency of 82.3%. Sleep onset after CPAP initiation was 19.4 minutes with a REM latency of 78.0 minutes. The patient spent 8.84% of the night in stage N1 sleep, 69.05% in stage N2 sleep, 0.00% in stage N3 and 22.11% in REM.  CARDIAC DATA The 2 lead EKG demonstrated sinus rhythm. The mean heart rate was 81.65 beats per minute. Other EKG findings include: PVCs.  LEG MOVEMENT DATA The total Periodic Limb Movements of Sleep (PLMS) were 146. The PLMS index was 28.40 .  IMPRESSIONS - Severe obstructive sleep apnea occurred during the diagnostic portion of the study (AHI = 36.9/hour). An optimal PAP pressure was selected for this patient ( 19 cm of water) - No significant central sleep apnea occurred during the diagnostic portion of the study (CAI = 0.0/hour). - Severe oxygen desaturation was noted during the diagnostic portion of the study (Min O2 = 84.00%). - The patient snored with Moderate snoring volume during the diagnostic portion of the study. - EKG findings include PVCs. - Severe periodic limb movements of sleep occurred during the study.  DIAGNOSIS - Obstructive Sleep Apnea (327.23 [G47.33 ICD-10])  RECOMMENDATIONS - As patient was unable to achieve REM supine sleep at optimum pressure, recommend full night CPAP titration with possible BiPAP titration. - Avoid alcohol, sedatives and other CNS depressants that may worsen sleep apnea and disrupt normal sleep architecture. - Sleep hygiene should  be reviewed to assess factors that may improve sleep quality. - Weight management and regular exercise should be initiated or continued.   Sueanne Margarita Diplomate, American Board of Sleep Medicine  ELECTRONICALLY SIGNED ON:  07/11/2015, 4:24 PM [place] PH: [phone]   FX: [phone] [place]

## 2015-07-12 NOTE — Telephone Encounter (Signed)
Patient informed of information.  Stated verbal understanding.  OKAY to proceed with titration.  Will call the sleep lab to schedule and call her back to let her know of the date.

## 2015-07-13 MED FILL — PROGESTERONE 100 MG CAPSULE: 100 | 90 days supply | Qty: 90 | Fill #0

## 2015-07-18 ENCOUNTER — Telehealth: Payer: Self-pay | Admitting: Cardiology

## 2015-07-18 DIAGNOSIS — G4733 Obstructive sleep apnea (adult) (pediatric): Secondary | ICD-10-CM

## 2015-07-18 NOTE — Telephone Encounter (Signed)
Patient aware that we are scheduling titration. I will call her this afternoon with the date.

## 2015-07-18 NOTE — Telephone Encounter (Signed)
New message    Patient need to speak with nurse regarding sleep study results

## 2015-07-21 MED FILL — AMLODIPINE BESYLATE 5 MG TA: 5 | 90 days supply | Qty: 90 | Fill #1

## 2015-07-25 ENCOUNTER — Ambulatory Visit (INDEPENDENT_AMBULATORY_CARE_PROVIDER_SITE_OTHER): Payer: Self-pay | Admitting: Family Medicine

## 2015-07-25 ENCOUNTER — Ambulatory Visit: Payer: Self-pay | Admitting: Pharmacist

## 2015-07-25 VITALS — BP 138/54 | Wt 195.6 lb

## 2015-07-25 DIAGNOSIS — E119 Type 2 diabetes mellitus without complications: Secondary | ICD-10-CM

## 2015-07-25 MED FILL — ESTRADIOL 0.5 MG TABLET: 0.5 | 90 days supply | Qty: 45 | Fill #1

## 2015-07-25 NOTE — Progress Notes (Signed)
Subjective:  Patient presents today for 3 month diabetes follow-up as part of the employer-sponsored Link to Wellness program.  Current diabetes regimen includes metformin 1000 mg ER BID, glipizide 5 mg XL once daily, pioglitazone 30 mg daily. Patient also continues on daily ASA, ACE Inhibitor and statin.  Patient has a pending appointment this week to see Dr. Zachery Dauer so I will not check A1C today. No med changes or major health changes at this time.    Patient has had a lot of changes in her life. Her mother in law has been in the hospital and she will be moving into an apartment in the next few weeks because of a mold and bed bug infestation in her home.    Assessment:  Diabetes: Most recent A1C was 7.0  % which is at goal less than 7%. Weight is stable from last visit with me.   Based on CBG readings A1C is likely at 7% or less.   CBG Review: She states that she is checking her blood sugar once daily in the morning. Majority of fasting readings are between 80-120.   High/Low- 148/60  Reports a couple of low blood sugars in the last few months. This was after skipping a meal. She corrected it with regular soda.   CBG averages are about 15-20 points better than last visit. This is likely because she is not eating out as often.    Lifestyle improvements:  Physical Activity-  She has not been exercising since her mother in law went into the hospital. She is moving next week into an apartment that has an exercise room and she is looking forward to using the gym at her new complex.    Nutrition-  She states that she has cut back on eating out and now when she is eating out she is eating out a [place]. She is no longer eating out for [place]. This is a big change for the patient as she was eating out at least once daily.   When she moves to her new apartment she will no longer have the funds to be able eat out daily. She states that she will be cooking her meals starting next week.    Follow up with me in 3 months.    Plan/Goals for Next Visit:  1. Make an appointment to see the dentist.  2. When you move into your new apartment cut back on eating out and start cooking for yourself and preparing your own meals.  3. After you get moved in start back with your stretching exercise (touching your toes) and walking or using the exercise bike at your complex gym.     Next appointment to see me is: Monday May 1st at 2 PM.    [name] D. Vivia Ewing, PharmD, BCPS, CDE [facility] [place] to [facility] Clinical Pharmacist & Diabetes Care Coordinator [phone]

## 2015-07-27 DIAGNOSIS — Z7984 Long term (current) use of oral hypoglycemic drugs: Secondary | ICD-10-CM | POA: Diagnosis not present

## 2015-07-27 DIAGNOSIS — E559 Vitamin D deficiency, unspecified: Secondary | ICD-10-CM | POA: Diagnosis not present

## 2015-07-27 DIAGNOSIS — I10 Essential (primary) hypertension: Secondary | ICD-10-CM | POA: Diagnosis not present

## 2015-07-27 DIAGNOSIS — E7800 Pure hypercholesterolemia, unspecified: Secondary | ICD-10-CM | POA: Diagnosis not present

## 2015-07-27 DIAGNOSIS — F339 Major depressive disorder, recurrent, unspecified: Secondary | ICD-10-CM | POA: Diagnosis not present

## 2015-07-27 DIAGNOSIS — E119 Type 2 diabetes mellitus without complications: Secondary | ICD-10-CM | POA: Diagnosis not present

## 2015-07-27 DIAGNOSIS — F419 Anxiety disorder, unspecified: Secondary | ICD-10-CM | POA: Diagnosis not present

## 2015-08-01 NOTE — Progress Notes (Signed)
I have reviewed this pharmacist's note and agree  

## 2015-08-22 MED FILL — TRUE METRIX GLUCOSE TEST ST: 90 days supply | Qty: 400 | Fill #1

## 2015-08-22 MED FILL — RAMIPRIL 10 MG CAPSULE: 10 | 90 days supply | Qty: 180 | Fill #0

## 2015-08-22 MED FILL — glipiZIDE ER 5 MG TB24: 5 | 90 days supply | Qty: 90 | Fill #0

## 2015-08-22 MED FILL — TRUEplus LANCETS 30G MISC: 90 days supply | Qty: 400 | Fill #3

## 2015-08-22 MED FILL — VENLAFAXINE HCL ER 75 MG CA: 75 | 90 days supply | Qty: 90 | Fill #0

## 2015-08-22 MED FILL — PIOGLITAZONE HCL 30 MG TAB: 30 | 90 days supply | Qty: 90 | Fill #0

## 2015-08-22 MED FILL — METFORMIN HCL ER 500 MG TAB: 500 | 90 days supply | Qty: 360 | Fill #0

## 2015-08-22 MED FILL — HYDROCHLOROTHIAZIDE 12.5 MG: 12.5 | 90 days supply | Qty: 180 | Fill #0

## 2015-08-22 MED FILL — ATORVASTATIN 40 MG TABLET: 40 | 90 days supply | Qty: 90 | Fill #0

## 2015-08-28 ENCOUNTER — Ambulatory Visit (HOSPITAL_BASED_OUTPATIENT_CLINIC_OR_DEPARTMENT_OTHER): Payer: 59

## 2015-09-23 MED FILL — CARTIA XT 240 MG CAPSULE: 240 | 90 days supply | Qty: 90 | Fill #0

## 2015-10-07 ENCOUNTER — Encounter (HOSPITAL_BASED_OUTPATIENT_CLINIC_OR_DEPARTMENT_OTHER): Payer: 59

## 2015-10-11 MED FILL — PROGESTERONE 100 MG CAPSULE: 100 | 90 days supply | Qty: 90 | Fill #1

## 2015-10-11 MED FILL — AMLODIPINE BESYLATE 5 MG TA: 5 | 90 days supply | Qty: 90 | Fill #0

## 2015-10-14 ENCOUNTER — Ambulatory Visit (HOSPITAL_BASED_OUTPATIENT_CLINIC_OR_DEPARTMENT_OTHER): Payer: 59 | Attending: Cardiology

## 2015-10-14 VITALS — Wt 195.0 lb

## 2015-10-14 DIAGNOSIS — R0683 Snoring: Secondary | ICD-10-CM | POA: Insufficient documentation

## 2015-10-14 DIAGNOSIS — G4733 Obstructive sleep apnea (adult) (pediatric): Secondary | ICD-10-CM | POA: Insufficient documentation

## 2015-10-14 HISTORY — PX: CPAP TITRATION: SLE1004

## 2015-10-24 ENCOUNTER — Ambulatory Visit: Payer: Self-pay | Admitting: Pharmacist

## 2015-10-31 ENCOUNTER — Encounter: Payer: Self-pay | Admitting: Pharmacist

## 2015-10-31 ENCOUNTER — Other Ambulatory Visit: Payer: Self-pay | Admitting: Pharmacist

## 2015-10-31 ENCOUNTER — Ambulatory Visit: Payer: Self-pay | Admitting: Pharmacist

## 2015-10-31 VITALS — Ht 60.0 in | Wt 189.0 lb

## 2015-10-31 DIAGNOSIS — E119 Type 2 diabetes mellitus without complications: Secondary | ICD-10-CM

## 2015-10-31 NOTE — Patient Instructions (Signed)
Plan/Goals for Next Visit:  1. Continue checking blood sugar first thing in the morning and then again if you feel like you are low or you feel funny or sick. If you have more than 2-3 consistent low blood sugars during the week on a regular basis, call Dr. Zachery DauerBarnes to let her know and have your medications adjusted.  2. When you are taking the trash out to the dumpster, take an extra lap so you can increase your walking time.  3. Call and make an appointment to see the dentist.     Next appointment to see me is: Monday August 14th at 1:30 PM.

## 2015-10-31 NOTE — Patient Outreach (Signed)
Subjective:  Patient presents today for 3 month diabetes follow-up phone visit as part of the employer-sponsored Link to Wellness program.  Current diabetes regimen includes metformin 1000 mg ER BID, pioglitazone 30 mg daily, glipizide 5 mg ER once daily Patient also continues on daily ASA, ACE Inhibitor and statin.  Most recent MD follow-up was with Dr. Zachery DauerBarnes in January. A1C was 7.0% at last visit. Patient has a pending appt for August with Dr. Zachery DauerBarnes.  No med changes or major health changes at this time.   Since her last visit she has moved to a new apartment. She is doing more care taking for her mother in law. She states it was rough at first but has improved lately.   She had a sleep study done a few weeks ago and is waiting for her CPAP machine.   Patient was unable to come in person to today's visit so this was a phone visit.   Assessment:  Diabetes: Most recent A1C was 7.0 % which is at goal of less than 7%. Weight is decreased from last visit with me by about 5 pounds.    CBG Review: She states she is checking once daily in the morning.  CBG averages are about 10-15 lower than at her last appointment. This also corresponds with a 5 pound weight loss. She reports that she is busier during the day.   She states that she has had a few low readings- 1 was 65 mg/dL. She corrected with regular soda. Now that she is more active and is losing weight I cautioned patient to report any consistent low blood sugars to Dr. Zachery DauerBarnes as she may need to d/c glipizide.    Lifestyle improvements:  Physical Activity-  She reports that she is more active lately. She is walking 3-4 times a week around her complex. She is also walking her trash out to the dumpster.    Nutrition-  She reports that she is baking more foods (instead of frying) than she was previously. She is not eating out about once a week and is eating more of her meals at home. She reports that she is eating more chicken and salads.  She is getting dinner salads from [employer].   Follow up with me in 3 months.    Plan/Goals for Next Visit:  1. Continue checking blood sugar first thing in the morning and then again if you feel like you are low or you feel funny or sick. If you have more than 2-3 consistent low blood sugars during the week on a regular basis, call Dr. Zachery DauerBarnes to let her know and have your medications adjusted.  2. When you are taking the trash out to the dumpster, take an extra lap so you can increase your walking time.  3. Call and make an appointment to see the dentist.     Next appointment to see me is: Monday August 14th at 1:30 PM.    Ellender HoseMegan D. Vivia EwingWheatley, PharmD, BCPS, CDE [facility] Clinical [facility]ist & Diabetes Care Coordinator [phone]

## 2015-11-13 DIAGNOSIS — R05 Cough: Secondary | ICD-10-CM | POA: Diagnosis not present

## 2015-11-13 DIAGNOSIS — J018 Other acute sinusitis: Secondary | ICD-10-CM | POA: Diagnosis not present

## 2015-11-14 MED FILL — AMOX TR-K CLV 875-125 MG TA: 875-125 | 7 days supply | Qty: 14 | Fill #0

## 2015-11-14 MED FILL — BENZONATATE 100 MG CAPSULE: 100 | 7 days supply | Qty: 20 | Fill #0

## 2015-11-23 MED FILL — ATORVASTATIN 40 MG TABLET: 40 | 90 days supply | Qty: 90 | Fill #1

## 2015-11-23 MED FILL — METFORMIN HCL ER 500 MG TAB: 500 | 90 days supply | Qty: 360 | Fill #1

## 2015-11-23 MED FILL — VENLAFAXINE HCL ER 75 MG CA: 75 | 90 days supply | Qty: 90 | Fill #1

## 2015-11-23 MED FILL — RAMIPRIL 10 MG CAPSULE: 10 | 90 days supply | Qty: 180 | Fill #1

## 2015-11-23 MED FILL — HYDROCHLOROTHIAZIDE 12.5 MG: 12.5 | 90 days supply | Qty: 180 | Fill #1

## 2015-11-23 MED FILL — TRUEplus LANCETS 30G MISC: 90 days supply | Qty: 400 | Fill #4

## 2015-11-23 MED FILL — TRUE METRIX GLUCOSE TEST ST: 90 days supply | Qty: 400 | Fill #2

## 2015-11-23 MED FILL — ESTRADIOL 0.5 MG TABLET: 0.5 | 90 days supply | Qty: 45 | Fill #2

## 2015-11-23 MED FILL — PIOGLITAZONE HCL 30 MG TAB: 30 | 90 days supply | Qty: 90 | Fill #1

## 2015-11-23 MED FILL — glipiZIDE ER 5 MG TB24: 5 | 90 days supply | Qty: 90 | Fill #1

## 2015-12-06 ENCOUNTER — Telehealth: Payer: Self-pay | Admitting: Cardiology

## 2015-12-06 NOTE — Telephone Encounter (Signed)
New Message  Pt is requesting call back for the sleep study results from April. Pt states that she has not retrieved these results

## 2015-12-06 NOTE — Telephone Encounter (Signed)
Spoke with patient to let her know that I am working on trying to get these results.  I spoke with the sleep lab and was told that this is the patient that Marita KansasVernon talked with Dr. Mayford Knifeurner about last week.  I am routing this note to Dr. Mayford Knifeurner to see if she has anything on this patient.    Patient is aware that I will call her as soon as I have more information.  She stated that she understood and thanked me for the call.

## 2015-12-07 ENCOUNTER — Telehealth: Payer: Self-pay | Admitting: Cardiology

## 2015-12-07 ENCOUNTER — Encounter (HOSPITAL_BASED_OUTPATIENT_CLINIC_OR_DEPARTMENT_OTHER): Payer: Self-pay

## 2015-12-07 NOTE — Procedures (Signed)
   Patient Name: Kayla Brown, Kayla Brown MRN: [id number] Study Date: 10/14/2015 Gender: Female D.O.B: 07-07-1950 Age (years): [age] Referring Provider: Armanda Magicraci Turner MD, ABSM Interpreting Physician: Armanda Magicraci Turner MD, ABSM RPSGT: Melburn PopperWillard, Susan  Weight (lbs): 195 BMI: 38 Height (inches): 60 Neck Size: 14.00  CLINICAL INFORMATION The patient is referred for a CPAP titration to treat sleep apnea.  SLEEP STUDY TECHNIQUE As per the AASM Manual for the Scoring of Sleep and Associated Events v2.3 (April 2016) with a hypopnea requiring 4% desaturations. The channels recorded and monitored were frontal, central and occipital EEG, electrooculogram (EOG), submentalis EMG (chin), nasal and oral airflow, thoracic and abdominal wall motion, anterior tibialis EMG, snore microphone, electrocardiogram, and pulse oximetry. Continuous positive airway pressure (CPAP) was initiated at the beginning of the study and titrated to treat sleep-disordered breathing.  MEDICATIONS Medications taken by the patient : Reviewed in the chart Medications administered by patient during sleep study : No sleep medicine administered.  TECHNICIAN COMMENTS Comments added by technician: NONE  Comments added by scorer: N/A  RESPIRATORY PARAMETERS Optimal PAP Pressure (cm):20  AHI at Optimal Pressure (/hr):0.0 Overall Minimal O2 (%):84.00  Supine % at Optimal Pressure (%):100 Minimal O2 at Optimal Pressure (%):92.0    SLEEP ARCHITECTURE The study was initiated at 10:54:42 PM and ended at 4:58:11 AM. Sleep onset time was 25.1 minutes and the sleep efficiency was reduced at 80.0%. The total sleep time was 290.9 minutes. The patient spent 19.25% of the night in stage N1 sleep, 63.22% in stage N2 sleep, 0.00% in stage N3 and 17.53% in REM.Stage REM latency was 93.5 minutes Wake after sleep onset was 47.5. Alpha intrusion was absent. Supine sleep was 100.00%.  CARDIAC DATA The 2 lead EKG demonstrated sinus rhythm. The mean heart  rate was 70.58 beats per minute. Other EKG findings include: None . LEG MOVEMENT DATA The total Periodic Limb Movements of Sleep (PLMS) were 155. The PLMS index was 31.97. A PLMS index of <15 is considered normal in adults.  IMPRESSIONS - The optimal PAP pressure was 20 cm of water. - Central sleep apnea was not noted during this titration (CAI = 0.2/h). - Moderate oxygen desaturations were observed during this titration (min O2 = 84.00%). - The patient snored with Loud snoring volume during this titration study. - No cardiac abnormalities were observed during this study. - Moderate periodic limb movements were observed during this study. Arousals associated with PLMs were rare.  DIAGNOSIS - Obstructive Sleep Apnea (327.23 [G47.33 ICD-10])  RECOMMENDATIONS - Trial of CPAP therapy on 20 cm H2O with a Small size Fisher&Paykel Full Face Mask Simplus mask and heated humidification. - Avoid alcohol, sedatives and other CNS depressants that may worsen sleep apnea and disrupt normal sleep architecture. - Sleep hygiene should be reviewed to assess factors that may improve sleep quality. - Weight management and regular exercise should be initiated or continued. - Return to Sleep Center for re-evaluation after 10 weeks of therapy   Armanda Magicraci Turner Diplomate, American Board of Sleep Medicine  ELECTRONICALLY SIGNED ON:  12/07/2015, 10:01 PM [facility] PH: [phone]   FX: [phone] ACCREDITED BY THE AMERICAN ACADEMY OF SLEEP MEDICINE

## 2015-12-07 NOTE — Telephone Encounter (Signed)
Pt had successful PAP titration. Please setup appointment in 10 weeks. Please let [facility] know that order for PAP is in EPIC.   

## 2015-12-07 NOTE — Telephone Encounter (Signed)
Not sure how this was missed being put in EPIC and [facility]

## 2015-12-07 NOTE — Telephone Encounter (Signed)
Note is in epic and order has been placed

## 2015-12-08 NOTE — Telephone Encounter (Signed)
Patient informed of information. Stated verbal understanding.  [facility] to see if we can get this done quickly since the patient has waited longer for results.        Once patient is set up with machine, she will call me to schedule 10 week follow-up.

## 2015-12-19 DIAGNOSIS — G4733 Obstructive sleep apnea (adult) (pediatric): Secondary | ICD-10-CM | POA: Diagnosis not present

## 2015-12-19 MED FILL — CARTIA XT 240 MG CAPSULE: 240 | 90 days supply | Qty: 90 | Fill #1

## 2016-01-10 MED FILL — AMLODIPINE BESYLATE 5 MG TA: 5 | 90 days supply | Qty: 90 | Fill #1

## 2016-01-10 MED FILL — PROGESTERONE 100 MG CAPSULE: 100 | 90 days supply | Qty: 90 | Fill #2

## 2016-01-18 DIAGNOSIS — G4733 Obstructive sleep apnea (adult) (pediatric): Secondary | ICD-10-CM | POA: Diagnosis not present

## 2016-01-24 LAB — HM DIABETES FOOT EXAM

## 2016-02-06 ENCOUNTER — Other Ambulatory Visit: Payer: Self-pay | Admitting: Pharmacist

## 2016-02-06 ENCOUNTER — Encounter: Payer: Self-pay | Admitting: Pharmacist

## 2016-02-06 VITALS — BP 138/58 | Wt 204.0 lb

## 2016-02-06 DIAGNOSIS — E119 Type 2 diabetes mellitus without complications: Secondary | ICD-10-CM

## 2016-02-06 DIAGNOSIS — I10 Essential (primary) hypertension: Secondary | ICD-10-CM | POA: Diagnosis not present

## 2016-02-06 DIAGNOSIS — G4733 Obstructive sleep apnea (adult) (pediatric): Secondary | ICD-10-CM | POA: Diagnosis not present

## 2016-02-06 DIAGNOSIS — Z7984 Long term (current) use of oral hypoglycemic drugs: Secondary | ICD-10-CM | POA: Diagnosis not present

## 2016-02-06 DIAGNOSIS — E559 Vitamin D deficiency, unspecified: Secondary | ICD-10-CM | POA: Diagnosis not present

## 2016-02-06 DIAGNOSIS — E7800 Pure hypercholesterolemia, unspecified: Secondary | ICD-10-CM | POA: Diagnosis not present

## 2016-02-06 DIAGNOSIS — N898 Other specified noninflammatory disorders of vagina: Secondary | ICD-10-CM | POA: Diagnosis not present

## 2016-02-06 DIAGNOSIS — F339 Major depressive disorder, recurrent, unspecified: Secondary | ICD-10-CM | POA: Diagnosis not present

## 2016-02-06 LAB — BASIC METABOLIC PANEL
BUN: 37 mg/dL — AB (ref 4–21)
Creatinine: 0.9 mg/dL (ref 0.5–1.1)
Glucose: 98 mg/dL
Potassium: 3.9 mmol/L (ref 3.4–5.3)
Sodium: 140 mmol/L (ref 137–147)

## 2016-02-06 LAB — LIPID PANEL
Cholesterol: 147 mg/dL (ref 0–200)
HDL: 64 mg/dL (ref 35–70)
LDL Cholesterol: 67 mg/dL
Triglycerides: 78 mg/dL (ref 40–160)

## 2016-02-06 LAB — HEPATIC FUNCTION PANEL
ALT: 20 U/L (ref 7–35)
AST: 13 U/L (ref 13–35)
Alkaline Phosphatase: 51 U/L (ref 25–125)
Bilirubin, Total: 0.4 mg/dL

## 2016-02-06 LAB — HEMOGLOBIN A1C: Hemoglobin A1C: 6.7

## 2016-02-06 NOTE — Patient Outreach (Signed)
Subjective:  Patient presents today for 3 month diabetes follow-up as part of the employer-sponsored Link to Wellness program.  Current diabetes regimen includes pioglitazone 30 mg once daily, metformin ER 1000 mg BID, glipizide 5 mg XL daily.   Patient also continues on daily ASA, ACE Inhibitor and statin.  Most recent MD follow-up was today with Dr. Zachery DauerBarnes. She had bloodwork drawn today but she has not been notified of the results yet. Patient has a pending appt for February 2018.   No med changes or major health changes at this time.   She got her CPAP machine and that has been working well for her. Her dizziness during the day has improved and she feels better during the day.  Patient was excited to tell me that she has finally been in to see the dentist (last visit was 18 months ago!!).   BP was elevated today but patient states that she did not take BP medications this morning because she was fasting. She just took her medications before lunch. Second BP reading at the end of the visit was much improved and was less than 140/80.   Assessment:  Diabetes: Most recent A1C was 7.0  % which is at goal of less than 7%. Weight is increased from last visit with me.    CBG Review: Patient is checking blood sugar once daily in the morning. CBG averages are similar to last visit.   Patient denies hypoglycemia. A few readings are in the 60-70s- she states that when she starts to feel like she is going low she has something to eat and then she feels better.   High/Low-155/60  Lifestyle improvements:  Physical Activity-  She is walking to the trash can at her apartment complex. She has also been walking on the sidewalk for about 15 minutes 3-4 days a week. She is also walking up and down the stairs for exercise.   She had questions for me regarding how intensely she should exercise. She states that last week she was walking hard enough the she was out of breath for 10 minutes. I explained to her  that given her cardiac history she should be walking so that she can still carry on a conversation. I also advised her to bring her phone with her too.  Nutrition-  She reports that she is cooking and eating out, about 50/50.   B- [place] sausage, egg, cheese, cream cheese and jelly on a piece of toast or a mini bagel L- fruit, yogurt, sometimes she is eating out.  D- Baked potato, sometimes flavored rice.   She states that lately she has been eating more french fries. Before it was once a week but now it is more 2-3 times a week.   She has increased fruit intake and is eating a few servings a day. Before she was not eating fruit hardly at all.   She is eating ice cream every night- sometimes two cups.    Follow up with me in  5 months when I return from maternity leave.    Plan/Goals for Next Visit:  1. Continue walking three times a week for 15 minutes. Remember- a good pace is one that you can still carry on a conversation. Don't walk so hard that you are out of breath. Increase your step climbing to twice a week.  2. Especially when you are eating out, work on keeping your portion sizes reasonable and small. Cut back on french fries to once a week.  3. Cut back to 1 serving of ice cream a night.     Next appointment to see me is: January 5th at 2:30 PM.    Aundra MilletMegan D. Vivia EwingWheatley, PharmD, BCPS, CDE [facility] Clinical Pharmacist & Diabetes Care Coordinator [phone]

## 2016-02-17 ENCOUNTER — Encounter: Payer: Self-pay | Admitting: Cardiology

## 2016-02-18 DIAGNOSIS — G4733 Obstructive sleep apnea (adult) (pediatric): Secondary | ICD-10-CM | POA: Diagnosis not present

## 2016-02-20 MED FILL — RAMIPRIL 10 MG CAPSULE: 10 | 90 days supply | Qty: 180 | Fill #0

## 2016-02-20 MED FILL — TRUE METRIX GLUCOSE TEST ST: 90 days supply | Qty: 400 | Fill #0

## 2016-02-20 MED FILL — HYDROCHLOROTHIAZIDE 12.5 MG: 12.5 | 90 days supply | Qty: 180 | Fill #0

## 2016-02-20 MED FILL — TRUEplus LANCETS 30G MISC: 90 days supply | Qty: 400 | Fill #0

## 2016-02-20 MED FILL — METFORMIN HCL ER 500 MG TAB: 500 | 90 days supply | Qty: 360 | Fill #0

## 2016-02-20 MED FILL — glipiZIDE ER 5 MG TB24: 5 | 90 days supply | Qty: 90 | Fill #0

## 2016-02-20 MED FILL — ESTRADIOL 0.5 MG TABLET: 0.5 | 90 days supply | Qty: 45 | Fill #3

## 2016-02-20 MED FILL — ATORVASTATIN 40 MG TABLET: 40 | 90 days supply | Qty: 90 | Fill #0

## 2016-02-20 MED FILL — CARTIA XT 240 MG CAPSULE: 240 | 90 days supply | Qty: 90 | Fill #0

## 2016-02-20 MED FILL — VENLAFAXINE HCL ER 75 MG CA: 75 | 90 days supply | Qty: 90 | Fill #0

## 2016-02-20 MED FILL — PIOGLITAZONE HCL 30 MG TAB: 30 | 90 days supply | Qty: 90 | Fill #0

## 2016-02-29 ENCOUNTER — Encounter: Payer: Self-pay | Admitting: Cardiology

## 2016-03-01 ENCOUNTER — Encounter: Payer: Self-pay | Admitting: Cardiology

## 2016-03-01 DIAGNOSIS — E669 Obesity, unspecified: Secondary | ICD-10-CM

## 2016-03-01 HISTORY — DX: Obesity, unspecified: E66.9

## 2016-03-01 NOTE — Progress Notes (Signed)
Cardiology Office Note    Date:  03/02/2016   ID:  Kayla Brown, DOB 08/12/1950, MRN [id number]  PCP:  Gaye AlkenBARNES,ELIZABETH STEWART, MD  Cardiologist:  Armanda Magicraci Chavy Avera, MD   Chief Complaint  Patient presents with  . Sleep Apnea  . Hypertension    History of Present Illness:  Kayla Brown is a 65 y.o. female  who presents for evaluation of OSA.  She has a history of HTN, DM, moderate pulmonary HTN (PASP 43mmHg by echo 02/2015) and dyslipidemia.  She recently underwent PSG for snoring and excessive daytime sleepiness and was found to have severe OSA with an AHI of 36.9/hr and underwent CPAP titration to 19cm H2O but was unable to achieve REM supine sleep so underwent further CPAP titration to 20cm H2O.  She is doing well with her CPAP device.  She tolerates the full face mask and feels the pressure is adequate.  Since going on CPAP she has had improvement in daytime sleepiness and feels more restored in the am.  She does not think she is snoring.  She denies any significant  nasal congestion.  She has some mild mouth dryness but does use humidity with her device.  Since I saw her last she has gained 10lbs.  She has had some LE edema but has not been compliant with her sodium intake.  Past Medical History:  Diagnosis Date  . Diabetes mellitus   . Edema of extremities 03/02/2016  . Essential hypertension, benign   . Major depressive disorder, single episode, unspecified (HCC)   . Obesity (BMI 30-39.9) 03/01/2016  . Pure hypercholesterolemia     Past Surgical History:  Procedure Laterality Date  . CPAP TITRATION  10/14/2015  . DILATION AND CURETTAGE OF UTERUS  2005-2008   x2  . EYE SURGERY     cataracts  . WISDOM TOOTH EXTRACTION      Current Medications: Outpatient Medications Prior to Visit  Medication Sig Dispense Refill  . AMLODIPINE BESYLATE PO Take 5 mg by mouth daily.    [name]. aspirin 325 MG tablet Take 325 mg by mouth daily.      [name]. atorvastatin (LIPITOR) 40 MG tablet Take 40 mg by  mouth daily.      . cetirizine (ZYRTEC) 10 MG tablet Take 10 mg by mouth daily.    . Cholecalciferol (VITAMIN D PO) Take 5,000 Int'l Units by mouth daily.     [name]. diltiazem (DILACOR XR) 240 MG 24 hr capsule Take 240 mg by mouth daily.      [name]. estradiol (ESTRACE) 0.5 MG tablet Take 0.5 tablets (0.25 mg total) by mouth daily. 90 tablet 4  . fish oil-omega-3 fatty acids 1000 MG capsule Take 1 g by mouth daily.    . Garlic 500 MG TABS Take 500 mg by mouth daily.    [name]. glipiZIDE (GLUCOTROL XL) 5 MG 24 hr tablet Take 5 mg by mouth daily with breakfast.    . hydrochlorothiazide (MICROZIDE) 12.5 MG capsule Take 25 mg by mouth daily.     . metFORMIN (GLUCOPHAGE-XR) 500 MG 24 hr tablet Take 1,000 mg by mouth 2 (two) times daily before a meal.     . Multiple Vitamins-Minerals (VISION FORMULA) TABS Take 1 tablet by mouth every morning.     . pioglitazone (ACTOS) 30 MG tablet Take 30 mg by mouth daily.      . progesterone (PROMETRIUM) 100 MG capsule Take 1 capsule (100 mg total) by mouth daily. 90 capsule 4  . ramipril (ALTACE) 10  MG capsule Take 20 mg by mouth daily.     [name] venlafaxine (EFFEXOR-XR) 75 MG 24 hr capsule Take 75 mg by mouth daily.       No facility-administered medications prior to visit.      Allergies:   Review of patient's allergies indicates no known allergies.   Social History   Social History  . Marital status: Married    Spouse name: N/A  . Number of children: N/A  . Years of education: N/A   Social History Main Topics  . Smoking status: Never Smoker  . Smokeless tobacco: Never Used  . Alcohol use No  . Drug use: No  . Sexual activity: No   Other Topics Concern  . None   Social History Narrative  . None     Family History:  The patient's family history includes Diabetes in her mother. She was adopted.   ROS:   Please see the history of present illness.    ROS All other systems reviewed and are negative.   PHYSICAL EXAM:   VS:  BP 138/60   Pulse 76   Ht 5'  (1.524 m)   Wt 210 lb 12.8 oz (95.6 kg)   LMP 06/25/2008   BMI 41.17 kg/m    GEN: Well nourished, well developed, in no acute distress  HEENT: normal  Neck: no JVD, carotid bruits, or masses Cardiac: RRR; no murmurs, rubs, or gallops,no edema.  Intact distal pulses bilaterally.  Respiratory:  clear to auscultation bilaterally, normal work of breathing GI: soft, nontender, nondistended, + BS MS: no deformity or atrophy  Skin: warm and dry, no rash Neuro:  Alert and Oriented x 3, Strength and sensation are intact Psych: euthymic mood, full affect  Wt Readings from Last 3 Encounters:  03/02/16 210 lb 12.8 oz (95.6 kg)  02/06/16 204 lb (92.5 kg)  10/31/15 189 lb (85.7 kg)      Studies/Labs Reviewed:   EKG:  EKG is not ordered today.    Recent Labs: 03/30/2015: BUN 31; Creat 0.97; Potassium 4.8; Sodium 140 04/08/2015: Brain Natriuretic Peptide 25.5   Lipid Panel    Component Value Date/Time   CHOL 122 (L) 04/08/2015 0753   TRIG 85 04/08/2015 0753   HDL 58 04/08/2015 0753   CHOLHDL 2.1 04/08/2015 0753   VLDL 17 04/08/2015 0753   LDLCALC 47 04/08/2015 0753    Additional studies/ records that were reviewed today include:  CPAP d/l    ASSESSMENT:    1. OSA (obstructive sleep apnea)   2. Essential hypertension, benign   3. Pulmonary hypertension (HCC)   4. Obesity (BMI 30-39.9)   5. Edema of extremities      PLAN:  In order of problems listed above:  OSA - the patient is tolerating PAP therapy well without any problems. The PAP download was reviewed today and showed an AHI of 0.5/hr on 20 cm H2O with 100% compliance in using more than 4 hours nightly.  The patient has been using and benefiting from CPAP use and will continue to benefit from therapy.  2.   HTN - BP controlled on current meds.   Continue Altace, diuretic, Cardizem and amlodipine. 3.   Moderate pulmonary HTN - Repeat echo to make sure this has not progressed.  This is most likely due to OSA. 4.    Obesity - I have encouraged him to get into a routine exercise program and cut back on carbs and portions.  5.  LE edema - secondary  to dietary indiscretion with sodium.  I have instructed her to not use any added salt and try to follow a 2gm sodium diet.  She will let me know if it does not improve.    Medication Adjustments/Labs and Tests Ordered: Current medicines are reviewed at length with the patient today.  Concerns regarding medicines are outlined above.  Medication changes, Labs and Tests ordered today are listed in the Patient Instructions below.  There are no Patient Instructions on file for this visit.   Signed, Armanda Magic, MD  03/02/2016 2:38 PM    [facility] [address] [place], [place], [place]  [address] Phone: [phone]; Fax: [phone]

## 2016-03-02 ENCOUNTER — Encounter: Payer: Self-pay | Admitting: Cardiology

## 2016-03-02 ENCOUNTER — Ambulatory Visit (INDEPENDENT_AMBULATORY_CARE_PROVIDER_SITE_OTHER): Payer: 59 | Admitting: Cardiology

## 2016-03-02 VITALS — BP 138/60 | HR 76 | Ht 60.0 in | Wt 210.8 lb

## 2016-03-02 DIAGNOSIS — I272 Other secondary pulmonary hypertension: Secondary | ICD-10-CM

## 2016-03-02 DIAGNOSIS — G4733 Obstructive sleep apnea (adult) (pediatric): Secondary | ICD-10-CM

## 2016-03-02 DIAGNOSIS — I10 Essential (primary) hypertension: Secondary | ICD-10-CM | POA: Diagnosis not present

## 2016-03-02 DIAGNOSIS — E669 Obesity, unspecified: Secondary | ICD-10-CM

## 2016-03-02 DIAGNOSIS — R600 Localized edema: Secondary | ICD-10-CM

## 2016-03-02 DIAGNOSIS — R609 Edema, unspecified: Secondary | ICD-10-CM

## 2016-03-02 HISTORY — DX: Localized edema: R60.0

## 2016-03-02 NOTE — Patient Instructions (Signed)
Medication Instructions:  Your physician recommends that you continue on your current medications as directed. Please refer to the Current Medication list given to you today.   Labwork: None  Testing/Procedures: Your physician has requested that you have an echocardiogram in December, 2017. Echocardiography is a painless test that uses sound waves to create images of your heart. It provides your doctor with information about the size and shape of your heart and how well your heart's chambers and valves are working. This procedure takes approximately one hour. There are no restrictions for this procedure.   Follow-Up: Your physician wants you to follow-up in: 6 months with Dr. Mayford Knifeurner. You will receive a reminder letter in the mail two months in advance. If you don't receive a letter, please call our office to schedule the follow-up appointment.   Any Other Special Instructions Will Be Listed Below (If Applicable). Your CPAP is being decreased to 19cm H2O. We will get a download in 2 weeks and call you with an update.    If you need a refill on your cardiac medications before your next appointment, please call your pharmacy.

## 2016-03-20 DIAGNOSIS — G4733 Obstructive sleep apnea (adult) (pediatric): Secondary | ICD-10-CM | POA: Diagnosis not present

## 2016-03-29 MED FILL — AMLODIPINE BESYLATE 5 MG TA: 5 | 90 days supply | Qty: 90 | Fill #0

## 2016-03-29 MED FILL — PROGESTERONE 100 MG CAPSULE: 100 | 90 days supply | Qty: 90 | Fill #3

## 2016-04-19 DIAGNOSIS — G4733 Obstructive sleep apnea (adult) (pediatric): Secondary | ICD-10-CM | POA: Diagnosis not present

## 2016-04-24 DIAGNOSIS — Z23 Encounter for immunization: Secondary | ICD-10-CM | POA: Diagnosis not present

## 2016-04-25 LAB — HM DIABETES EYE EXAM

## 2016-05-07 DIAGNOSIS — G4733 Obstructive sleep apnea (adult) (pediatric): Secondary | ICD-10-CM | POA: Diagnosis not present

## 2016-05-14 DIAGNOSIS — H5213 Myopia, bilateral: Secondary | ICD-10-CM | POA: Diagnosis not present

## 2016-05-20 DIAGNOSIS — G4733 Obstructive sleep apnea (adult) (pediatric): Secondary | ICD-10-CM | POA: Diagnosis not present

## 2016-05-21 ENCOUNTER — Other Ambulatory Visit: Payer: Self-pay | Admitting: Obstetrics & Gynecology

## 2016-05-21 MED FILL — TRUE METRIX GLUCOSE TEST ST: 90 days supply | Qty: 400 | Fill #1

## 2016-05-21 MED FILL — HYDROCHLOROTHIAZIDE 12.5 MG: 12.5 | 90 days supply | Qty: 180 | Fill #1

## 2016-05-21 MED FILL — PIOGLITAZONE HCL 30 MG TAB: 30 | 90 days supply | Qty: 90 | Fill #1

## 2016-05-21 MED FILL — VENLAFAXINE HCL ER 75 MG CA: 75 | 90 days supply | Qty: 90 | Fill #1

## 2016-05-21 MED FILL — METFORMIN HCL ER 500 MG TAB: 500 | 90 days supply | Qty: 360 | Fill #1

## 2016-05-21 MED FILL — ATORVASTATIN 40 MG TABLET: 40 | 90 days supply | Qty: 90 | Fill #1

## 2016-05-21 MED FILL — CARTIA XT 240 MG CAPSULE: 240 | 90 days supply | Qty: 90 | Fill #1

## 2016-05-21 MED FILL — TRUEplus LANCETS 30G MISC: 90 days supply | Qty: 400 | Fill #1

## 2016-05-21 MED FILL — RAMIPRIL 10 MG CAPSULE: 10 | 90 days supply | Qty: 180 | Fill #1

## 2016-05-21 MED FILL — glipiZIDE XL 5 MG TB24: 5 | 90 days supply | Qty: 90 | Fill #1

## 2016-05-21 MED FILL — ESTRADIOL 0.5 MG TABLET: 0.5 | 90 days supply | Qty: 45 | Fill #0

## 2016-05-21 NOTE — Telephone Encounter (Signed)
Medication refill request: Estradiol  Last AEX:  05-06-15  Next AEX: 08-28-16  Last MMG (if hormonal medication request): 05-11-15 WNL  Refill authorized: please advise

## 2016-06-06 ENCOUNTER — Ambulatory Visit (HOSPITAL_COMMUNITY): Payer: 59 | Attending: Cardiology

## 2016-06-06 ENCOUNTER — Other Ambulatory Visit: Payer: Self-pay

## 2016-06-06 DIAGNOSIS — E119 Type 2 diabetes mellitus without complications: Secondary | ICD-10-CM | POA: Diagnosis not present

## 2016-06-06 DIAGNOSIS — G4733 Obstructive sleep apnea (adult) (pediatric): Secondary | ICD-10-CM | POA: Diagnosis not present

## 2016-06-06 DIAGNOSIS — I119 Hypertensive heart disease without heart failure: Secondary | ICD-10-CM | POA: Insufficient documentation

## 2016-06-06 DIAGNOSIS — I272 Pulmonary hypertension, unspecified: Secondary | ICD-10-CM | POA: Insufficient documentation

## 2016-06-08 ENCOUNTER — Encounter: Payer: Self-pay | Admitting: Cardiology

## 2016-06-11 ENCOUNTER — Telehealth: Payer: Self-pay

## 2016-06-11 DIAGNOSIS — I272 Pulmonary hypertension, unspecified: Secondary | ICD-10-CM

## 2016-06-11 NOTE — Telephone Encounter (Signed)
Informed patient of results and verbal understanding expressed.  Repeat ECHO ordered to be scheduled in 1 year. Patient agrees with treatment plan. 

## 2016-06-11 NOTE — Telephone Encounter (Signed)
-----   Message from Quintella Reichertraci R Turner, MD sent at 06/08/2016  9:04 AM EST ----- Echo showed normal LVF with trivial AI and MR and mild pulmonary HTN - repeat echo in 1 year to reevaluate pulmonary HTN

## 2016-06-19 DIAGNOSIS — G4733 Obstructive sleep apnea (adult) (pediatric): Secondary | ICD-10-CM | POA: Diagnosis not present

## 2016-06-22 ENCOUNTER — Encounter: Payer: Self-pay | Admitting: General Practice

## 2016-06-29 ENCOUNTER — Ambulatory Visit: Payer: Self-pay | Admitting: Pharmacist

## 2016-07-02 ENCOUNTER — Other Ambulatory Visit: Payer: Self-pay | Admitting: Obstetrics & Gynecology

## 2016-07-02 MED FILL — AMLODIPINE BESYLATE 5 MG TA: 5 | 90 days supply | Qty: 90 | Fill #1

## 2016-07-02 MED FILL — PROGESTERONE 100 MG CAPSULE: 100 | 90 days supply | Qty: 90 | Fill #0

## 2016-07-02 NOTE — Telephone Encounter (Signed)
Medication refill request: Progesterone Last AEX:  05/06/15 SM Next AEX: 08/28/16 SM Last MMG (if hormonal medication request): 05/11/15 BIRADS1, Density B, [facility] Refill authorized: 05/06/15 #[id number]. Please advise. Thank you.

## 2016-07-06 ENCOUNTER — Other Ambulatory Visit: Payer: Self-pay | Admitting: Family Medicine

## 2016-07-06 ENCOUNTER — Ambulatory Visit (INDEPENDENT_AMBULATORY_CARE_PROVIDER_SITE_OTHER): Payer: 59 | Admitting: Family Medicine

## 2016-07-06 ENCOUNTER — Encounter: Payer: Self-pay | Admitting: Family Medicine

## 2016-07-06 ENCOUNTER — Encounter: Payer: Self-pay | Admitting: General Practice

## 2016-07-06 DIAGNOSIS — E785 Hyperlipidemia, unspecified: Secondary | ICD-10-CM | POA: Diagnosis not present

## 2016-07-06 DIAGNOSIS — I10 Essential (primary) hypertension: Secondary | ICD-10-CM | POA: Diagnosis not present

## 2016-07-06 DIAGNOSIS — E119 Type 2 diabetes mellitus without complications: Secondary | ICD-10-CM | POA: Diagnosis not present

## 2016-07-06 DIAGNOSIS — Z23 Encounter for immunization: Secondary | ICD-10-CM

## 2016-07-06 LAB — HEMOGLOBIN A1C
Hgb A1c MFr Bld: 6.9 % — ABNORMAL HIGH (ref <5.7)
Mean Plasma Glucose: 151 mg/dL

## 2016-07-06 NOTE — Assessment & Plan Note (Signed)
New to provider, ongoing for pt.  Tolerating statin w/o difficulty.  Stressed need for healthy diet and regular exercise.  Check labs.  Adjust meds prn  

## 2016-07-06 NOTE — Progress Notes (Signed)
Pre visit review using our clinic review tool, if applicable. No additional management support is needed unless otherwise documented below in the visit note. 

## 2016-07-06 NOTE — Progress Notes (Signed)
   Subjective:    Patient ID: Kayla Brown, female    DOB: 11/02/1950, 66 y.o.   MRN: [id number]  HPI New to establish.  Previous MD- Zachery DauerBarnes.    DM- chronic problem, currently on Glipizide 5mg , Metformin 1000mg  BID, Actos 30mg .  UTD on eye exam, on ACE for renal protection.  She would like to get back into regular exercise to lose weight.  HTN- chronic problem, on Amlodipine, Diltiazem, HCTZ, Ramipril w/ adequate control.  No CP, SOB, HAs, visual changes, edema.  Hyperlipidemia- chronic problem, currently on Fish Oil and Lipitor.  Last labs showed good control but due for repeat today.  Depression- currently on Effexor 75mg .  When it is time to renew medication, she would like to decrease to 37.5mg  and see how she feels b/c at present time, depression is well controlled.  Obesity-  BMI 42.  Not currently exercising but hopes to get back into it.  Not following a particular diet.  ADHD- pt reports she feels like 'growing up I had ADHD but it was never diagnosed'.  Pt took a computerized ADHD checklist and 'it fit me to a T'.  Pt reports she is not interested in an evaluation at this time- 'i think i'm managing'.    Health Maintenance- pt is due for colonoscopy (Dr Randa EvensEdwards)   Review of Systems For ROS see HPI     Objective:   Physical Exam  Constitutional: She is oriented to person, place, and time. She appears well-developed and well-nourished. No distress.  obese  HENT:  Head: Normocephalic and atraumatic.  Eyes: Conjunctivae and EOM are normal. Pupils are equal, round, and reactive to light.  Neck: Normal range of motion. Neck supple. No thyromegaly present.  Cardiovascular: Normal rate, regular rhythm, normal heart sounds and intact distal pulses.   No murmur heard. Pulmonary/Chest: Effort normal and breath sounds normal. No respiratory distress.  Abdominal: Soft. She exhibits no distension. There is no tenderness.  Musculoskeletal: She exhibits no edema.  Lymphadenopathy:   She has no cervical adenopathy.  Neurological: She is alert and oriented to person, place, and time.  Skin: Skin is warm and dry.  Psychiatric: She has a normal mood and affect. Her behavior is normal.  Vitals reviewed.         Assessment & Plan:

## 2016-07-06 NOTE — Assessment & Plan Note (Signed)
New to provider, ongoing for pt.  UTD on eye exam, on ACE for renal protection.  Stressed need for healthy diet and regular exercise.  Check labs.  Adjust meds prn

## 2016-07-06 NOTE — Assessment & Plan Note (Signed)
New to provider, ongoing for pt.  Check labs to risk stratify.  Stressed need for healthy diet and regular exercise.  Will follow. 

## 2016-07-06 NOTE — Patient Instructions (Signed)
Follow up in 3-4 months to recheck diabetes We'll notify you of your lab results and make any changes if needed Please call and schedule your colonoscopy at your convenience w/ Dr Randa EvensEdwards When you are due for your next refill on Effexor, please call and remind me that we are dropping the dose to 37.5mg  Try and exercise at least 3x/week for 30+ minutes at a time Call with any questions or concerns Welcome!  We're glad to have you!!

## 2016-07-06 NOTE — Assessment & Plan Note (Signed)
Chronic problem.  Adequate control today.  Asymptomatic at this time.  Stressed need for healthy diet and regular exercise.  Check labs.  No anticipated med changes.

## 2016-07-13 LAB — TSH: TSH: 2.12 mIU/L

## 2016-07-13 LAB — LIPID PANEL
Cholesterol: 147 mg/dL (ref <200)
HDL: 63 mg/dL (ref >50)
LDL Cholesterol: 61 mg/dL (ref <100)
Total CHOL/HDL Ratio: 2.3 Ratio (ref <5.0)
Triglycerides: 117 mg/dL (ref <150)
VLDL: 23 mg/dL (ref <30)

## 2016-07-27 ENCOUNTER — Telehealth: Payer: Self-pay | Admitting: Family Medicine

## 2016-07-27 DIAGNOSIS — H9190 Unspecified hearing loss, unspecified ear: Secondary | ICD-10-CM

## 2016-07-27 NOTE — Telephone Encounter (Signed)
Ok for hearing test- dx decreased hearing

## 2016-07-27 NOTE — Telephone Encounter (Signed)
Patient's husband calling on behalf of patient to request referral for hearing test.

## 2016-07-27 NOTE — Telephone Encounter (Signed)
This referral was placed today, pt husband made aware.

## 2016-07-27 NOTE — Telephone Encounter (Signed)
Ok for audiology referral 

## 2016-08-15 ENCOUNTER — Telehealth: Payer: Self-pay | Admitting: Family Medicine

## 2016-08-15 MED ORDER — DILTIAZEM HCL ER 240 MG PO CP24: 240.0000 mg | ORAL_CAPSULE | Freq: Every day | ORAL | 1 refills | Status: DC

## 2016-08-15 MED ORDER — HYDROCHLOROTHIAZIDE 12.5 MG PO CAPS
12.5000 mg | ORAL_CAPSULE | Freq: Two times a day (BID) | ORAL | 1 refills | Status: DC
Start: 2016-08-15 — End: 2016-09-20

## 2016-08-15 MED ORDER — GLIPIZIDE ER 5 MG PO TB24: 5.0000 mg | ORAL_TABLET | Freq: Every day | ORAL | 1 refills | Status: DC

## 2016-08-15 MED ORDER — VENLAFAXINE HCL 37.5 MG PO TABS: 37.5000 mg | ORAL_TABLET | Freq: Every day | ORAL | 3 refills | Status: DC

## 2016-08-15 MED ORDER — ATORVASTATIN CALCIUM 40 MG PO TABS: 40.0000 mg | ORAL_TABLET | Freq: Every day | ORAL | 1 refills | Status: DC

## 2016-08-15 MED ORDER — PIOGLITAZONE HCL 30 MG PO TABS: 30.0000 mg | ORAL_TABLET | Freq: Every day | ORAL | 1 refills | Status: DC

## 2016-08-15 MED ORDER — METFORMIN HCL ER 500 MG PO TB24: 1000.0000 mg | ORAL_TABLET | Freq: Two times a day (BID) | ORAL | 1 refills | Status: DC

## 2016-08-15 MED ORDER — RAMIPRIL 10 MG PO CAPS: 20.0000 mg | ORAL_CAPSULE | Freq: Every day | ORAL | 1 refills | Status: DC

## 2016-08-15 MED FILL — METFORMIN HCL ER 500 MG TAB: 500 | 90 days supply | Qty: 360 | Fill #0

## 2016-08-15 MED FILL — glipiZIDE XL 5 MG TB24: 5 | 90 days supply | Qty: 90 | Fill #0

## 2016-08-15 MED FILL — RAMIPRIL 10 MG CAPSULE: 10 | 90 days supply | Qty: 180 | Fill #0

## 2016-08-15 MED FILL — HYDROCHLOROTHIAZIDE 12.5 MG: 12.5 | 90 days supply | Qty: 180 | Fill #0

## 2016-08-15 MED FILL — PIOGLITAZONE HCL 30 MG TAB: 30 | 90 days supply | Qty: 90 | Fill #0

## 2016-08-15 MED FILL — CARTIA XT 240 MG CAPSULE: 240 | 90 days supply | Qty: 90 | Fill #0

## 2016-08-15 MED FILL — ATORVASTATIN 40 MG TABLET: 40 | 90 days supply | Qty: 90 | Fill #0

## 2016-08-15 MED FILL — VENLAFAXINE HCL 37.5 MG TAB: 37.5 | 30 days supply | Qty: 30 | Fill #0

## 2016-08-15 NOTE — Telephone Encounter (Signed)
Ok to decrease to 37.5mg  daily, #30, 3 refills.  If she starts feeling bad or other concerns, she can go back up to 75mg  (2 of the new script) and let me know

## 2016-08-15 NOTE — Telephone Encounter (Signed)
Medications were filled to local pharmacy.

## 2016-08-15 NOTE — Telephone Encounter (Signed)
Please advise 

## 2016-08-15 NOTE — Telephone Encounter (Signed)
Patient's husband calling to report pcp mentioned patient would be weaned off Effexor.  She needs a refill of Effexor 37.5mg , not 75mg .    [facility] [place] [address] [phone] (Phone) [phone] (Fax)

## 2016-08-27 NOTE — Progress Notes (Signed)
66 y.o. G0P0000 Married Caucasian F here for annual exam.  Doing well.  No vaginal bleeding.  Did a program with [facility] and diabetes.  Has a follow scheduled in April.  Diagnosed with pulmonary hypertension and was evaluated for OSA. She is now using a CPAP.      Patient's last menstrual period was 06/25/2008.          Sexually active: Yes.    The current method of family planning is post menopausal status.    Exercising: Yes.    walking Smoker:  no  Health Maintenance: Pap:  03/18/14 negative  History of abnormal Pap:  no MMG:  05/11/15 BIRADS 1 negative.  Aware this is due.   Colonoscopy:  2007- repeat 10 years- patient states she is working on scheduling BMD:   05/03/15 osteopenia  TDaP:  09/23/09 Pneumonia vaccine(s):  07/06/16  Zostavax:   Done with Dr. Zachery Dauer Hep C testing: drawn today  Screening Labs: PCP, Hb today: PCP   reports that she has never smoked. She has never used smokeless tobacco. She reports that she does not drink alcohol or use drugs.  Past Medical History:  Diagnosis Date  . Anxiety   . Diabetes mellitus   . Edema of extremities 03/02/2016  . Essential hypertension, benign   . Lazy eye   . Major depressive disorder, single episode, unspecified   . Obesity (BMI 30-39.9) 03/01/2016  . OSA (obstructive sleep apnea)   . Pulmonary HTN    mild with PASP by echo 05/2016  . Pure hypercholesterolemia   . Retinopathy, due to hypertension     Past Surgical History:  Procedure Laterality Date  . CPAP TITRATION  10/14/2015  . DILATION AND CURETTAGE OF UTERUS  2005-2008   x2  . EYE SURGERY     cataracts  . WISDOM TOOTH EXTRACTION      Current Outpatient Prescriptions  Medication Sig Dispense Refill  . AMLODIPINE BESYLATE PO Take 5 mg by mouth daily.    [name] aspirin 325 MG tablet Take 325 mg by mouth daily.      [name] atorvastatin (LIPITOR) 40 MG tablet Take 1 tablet (40 mg total) by mouth daily. 90 tablet 1  . CARTIA XT 240 MG 24 hr capsule   1  . cetirizine  (ZYRTEC) 10 MG tablet Take 10 mg by mouth daily.    . Cholecalciferol (VITAMIN D PO) Take 2,000 Int'l Units by mouth daily.     [name] diltiazem (DILACOR XR) 240 MG 24 hr capsule Take 1 capsule (240 mg total) by mouth daily. 90 capsule 1  . estradiol (ESTRACE) 0.5 MG tablet TAKE 1/2 TABLET BY MOUTH DAILY 90 tablet 1  . fish oil-omega-3 fatty acids 1000 MG capsule Take 1 g by mouth daily.    [name] glipiZIDE (GLUCOTROL XL) 5 MG 24 hr tablet Take 1 tablet (5 mg total) by mouth daily with breakfast. 90 tablet 1  . hydrochlorothiazide (MICROZIDE) 12.5 MG capsule Take 1 capsule (12.5 mg total) by mouth 2 (two) times daily. 90 capsule 1  . MELATONIN PO Take by mouth.    . metFORMIN (GLUCOPHAGE-XR) 500 MG 24 hr tablet Take 2 tablets (1,000 mg total) by mouth 2 (two) times daily before a meal. 180 tablet 1  . Multiple Vitamins-Minerals (VISION FORMULA) TABS Take 1 tablet by mouth every morning.     [name] OVER THE COUNTER MEDICATION Equate Vision Formula 50+    . pioglitazone (ACTOS) 30 MG tablet Take 1 tablet (30 mg  total) by mouth daily. 90 tablet 1  . progesterone (PROMETRIUM) 100 MG capsule TAKE 1 CAPSULE BY MOUTH DAILY. 90 capsule 0  . ramipril (ALTACE) 10 MG capsule Take 2 capsules (20 mg total) by mouth daily. 90 capsule 1  . TRUE METRIX BLOOD GLUCOSE TEST test strip   3  . TRUEPLUS LANCETS 30G MISC   2  . venlafaxine (EFFEXOR) 37.5 MG tablet Take 1 tablet (37.5 mg total) by mouth daily. 30 tablet 3   No current facility-administered medications for this visit.     Family History  Problem Relation Age of Onset  . Adopted: Yes  . Alcohol abuse Mother   . ADD / ADHD Mother   . Dementia Mother     ROS:  Pertinent items are noted in HPI.  Otherwise, a comprehensive ROS was negative.  Exam:   BP (!) 150/60 (BP Location: Right Arm, Patient Position: Sitting, Cuff Size: Large)   Pulse 72   Resp 14   Ht 5' (1.524 m)   Wt 219 lb 9.6 oz (99.6 kg)   LMP 06/25/2008   BMI 42.89 kg/m   Height: 5' (152.4 cm)   Ht Readings from Last 3 Encounters:  08/28/16 5' (1.524 m)  07/06/16 5' (1.524 m)  03/02/16 5' (1.524 m)    General appearance: alert, cooperative and appears stated age Head: Normocephalic, without obvious abnormality, atraumatic Neck: no adenopathy, supple, symmetrical, trachea midline and thyroid normal to inspection and palpation Lungs: clear to auscultation bilaterally Breasts: normal appearance, no masses or tenderness Heart: regular rate and rhythm Abdomen: soft, non-tender; bowel sounds normal; no masses,  no organomegaly Extremities: extremities normal, atraumatic, no cyanosis or edema Skin: Skin color, texture, turgor normal. No rashes or lesions Lymph nodes: Cervical, supraclavicular, and axillary nodes normal. No abnormal inguinal nodes palpated Neurologic: Grossly normal   Pelvic: External genitalia:  no lesions              Urethra:  normal appearing urethra with no masses, tenderness or lesions              Bartholins and Skenes: normal                 Vagina: normal appearing vagina with normal color and discharge, no lesions              Cervix: no lesions              Pap taken: Yes.   Bimanual Exam:  Uterus:  normal size, contour, position, consistency, mobility, non-tender              Adnexa: normal adnexa and no mass, fullness, tenderness               Rectovaginal: Confirms               Anus:  normal sphincter tone, no lesions  Chaperone was present for exam.  A:  Well Woman with normal exam PMP, on low dosed HRT, weaning off this ADHD Hypertension Diabetes Mellitus H/o elevated lipids Osteopenia Pulmonary hypertension due to OSA  P:   Mammogram guidelines discussed.  Pt aware hers is due.  Declines having me schedule this. Aware colononscopy is due.  States she will schedule this later this year pap smear and HR HPV obtained today Hep c antibody obtained today Estradiol 0.5mg .  Pt takes 1/2 tab daily but will decrease to every other day and  Prometrium 100mg  every other day as well. Will try to  wean off over the next year or two, if possible.  Pt clearly aware of risks. Lab work and vaccines with PCP return annually or prn

## 2016-08-28 ENCOUNTER — Other Ambulatory Visit (HOSPITAL_COMMUNITY)
Admission: RE | Admit: 2016-08-28 | Discharge: 2016-08-28 | Disposition: A | Discharge status: Home / Self Care | Payer: 59 | Source: Ambulatory Visit | Attending: Obstetrics & Gynecology | Admitting: Obstetrics & Gynecology

## 2016-08-28 ENCOUNTER — Encounter: Payer: Self-pay | Admitting: Obstetrics & Gynecology

## 2016-08-28 ENCOUNTER — Ambulatory Visit (INDEPENDENT_AMBULATORY_CARE_PROVIDER_SITE_OTHER): Payer: 59 | Admitting: Obstetrics & Gynecology

## 2016-08-28 VITALS — BP 150/60 | HR 72 | Resp 14 | Ht 60.0 in | Wt 219.6 lb

## 2016-08-28 DIAGNOSIS — Z01419 Encounter for gynecological examination (general) (routine) without abnormal findings: Secondary | ICD-10-CM | POA: Diagnosis not present

## 2016-08-28 DIAGNOSIS — Z1151 Encounter for screening for human papillomavirus (HPV): Secondary | ICD-10-CM | POA: Diagnosis not present

## 2016-08-28 DIAGNOSIS — Z124 Encounter for screening for malignant neoplasm of cervix: Secondary | ICD-10-CM

## 2016-08-28 DIAGNOSIS — Z205 Contact with and (suspected) exposure to viral hepatitis: Secondary | ICD-10-CM

## 2016-08-28 MED ORDER — ESTRADIOL 0.5 MG PO TABS: 0.2500 mg | ORAL_TABLET | Freq: Every day | ORAL | 4 refills | Status: DC

## 2016-08-28 MED ORDER — PROGESTERONE MICRONIZED 100 MG PO CAPS: 100.0000 mg | ORAL_CAPSULE | Freq: Every day | ORAL | 4 refills | Status: DC

## 2016-08-28 MED FILL — ESTRADIOL 0.5 MG TABLET: 0.5 | 90 days supply | Qty: 45 | Fill #0

## 2016-08-29 LAB — CYTOLOGY - PAP
Diagnosis: NEGATIVE
HPV: NOT DETECTED

## 2016-08-29 LAB — HEPATITIS C ANTIBODY: HCV Ab: NEGATIVE

## 2016-09-10 ENCOUNTER — Encounter: Payer: Self-pay | Admitting: Cardiology

## 2016-09-10 ENCOUNTER — Ambulatory Visit (INDEPENDENT_AMBULATORY_CARE_PROVIDER_SITE_OTHER): Payer: 59 | Admitting: Cardiology

## 2016-09-10 VITALS — BP 156/52 | HR 81 | Ht 60.0 in | Wt 221.8 lb

## 2016-09-10 DIAGNOSIS — I272 Pulmonary hypertension, unspecified: Secondary | ICD-10-CM

## 2016-09-10 DIAGNOSIS — R600 Localized edema: Secondary | ICD-10-CM

## 2016-09-10 DIAGNOSIS — I10 Essential (primary) hypertension: Secondary | ICD-10-CM

## 2016-09-10 DIAGNOSIS — G4733 Obstructive sleep apnea (adult) (pediatric): Secondary | ICD-10-CM

## 2016-09-10 MED ORDER — AMLODIPINE BESYLATE 10 MG PO TABS: 10.0000 mg | ORAL_TABLET | Freq: Every day | ORAL | 11 refills | Status: DC

## 2016-09-10 MED FILL — AMLODIPINE BESYLATE 10 MG T: 10 | 90 days supply | Qty: 90 | Fill #0

## 2016-09-10 NOTE — Progress Notes (Signed)
Cardiology Office Note    Date:  09/10/2016   ID:  Kayla Brown, DOB 05/08/1951, MRN [id number]  PCP:  Neena RhymesKatherine [name], MD  Cardiologist:  Armanda Magicraci Abella Shugart, MD   Chief Complaint  Patient presents with  . Sleep Apnea  . Hypertension    History of Present Illness:  Kayla Brown is a 66 y.o. female who presents for followup of OSA, HTN, DM, moderate pulmonary HTN (PASP 43mmHg by echo 02/2015) and dyslipidemia. She has severe OSA with an AHI of 36.9/hr and is on CPAP at 19cm H2O  She is doing well with her CPAP device.  She tolerates the full face mask and feels the pressure is adequate.  Since going on CPAP she has had improvement in daytime sleepiness and feels more restored in the am.  She does not think she is snoring.  She denies any significant  nasal congestion but occasionally has some mild mouth dryness.  She denies any chest pain or pressure, LE edema, dizziness, palpitations or syncope.  She has chronic DOE going up stairs due to morbid obesity and deconditioning.  She had a nuclear stress test in 2016 due to SOB that showed no ischemia.    Past Medical History:  Diagnosis Date  . Anxiety   . Diabetes mellitus   . Edema of extremities 03/02/2016  . Essential hypertension, benign   . Lazy eye   . Major depressive disorder, single episode, unspecified   . Obesity (BMI 30-39.9) 03/01/2016  . OSA (obstructive sleep apnea)   . Pulmonary HTN    mild with PASP 40mmHg by echo 05/2016  . Pure hypercholesterolemia   . Retinopathy, due to hypertension     Past Surgical History:  Procedure Laterality Date  . CPAP TITRATION  10/14/2015  . DILATION AND CURETTAGE OF UTERUS  2005-2008   x2  . EYE SURGERY     cataracts  . WISDOM TOOTH EXTRACTION      Current Medications: Current Meds  Medication Sig  . AMLODIPINE BESYLATE PO Take 5 mg by mouth daily.  [name]. aspirin 325 MG tablet Take 325 mg by mouth daily.    [name]. atorvastatin (LIPITOR) 40 MG tablet Take 1 tablet (40 mg total) by mouth  daily.  [name]. CARTIA XT 240 MG 24 hr capsule   . cetirizine (ZYRTEC) 10 MG tablet Take 10 mg by mouth daily.  . Cholecalciferol (VITAMIN D PO) Take 2,000 Int'l Units by mouth daily.   [name]. estradiol (ESTRACE) 0.5 MG tablet Take 0.5 tablets (0.25 mg total) by mouth daily.  . fish oil-omega-3 fatty acids 1000 MG capsule Take 1 g by mouth daily.  [name]. glipiZIDE (GLUCOTROL XL) 5 MG 24 hr tablet Take 1 tablet (5 mg total) by mouth daily with breakfast.  . hydrochlorothiazide (MICROZIDE) 12.5 MG capsule Take 1 capsule (12.5 mg total) by mouth 2 (two) times daily.  [name]. MELATONIN PO Take by mouth.  . metFORMIN (GLUCOPHAGE-XR) 500 MG 24 hr tablet Take 2 tablets (1,000 mg total) by mouth 2 (two) times daily before a meal.  . Multiple Vitamins-Minerals (VISION FORMULA) TABS Take 1 tablet by mouth every morning.   [name]. OVER THE COUNTER MEDICATION Equate Vision Formula 50+  . pioglitazone (ACTOS) 30 MG tablet Take 1 tablet (30 mg total) by mouth daily.  . progesterone (PROMETRIUM) 100 MG capsule Take 1 capsule (100 mg total) by mouth daily.  . ramipril (ALTACE) 10 MG capsule Take 2 capsules (20 mg total) by mouth daily.  Carlene Coria. TRUE METRIX BLOOD  GLUCOSE TEST test strip   . TRUEPLUS LANCETS 30G MISC   . venlafaxine (EFFEXOR) 37.5 MG tablet Take 1 tablet (37.5 mg total) by mouth daily.    Allergies:   Erythromycin; Tequin [gatifloxacin]; Tramadol; and Tylenol [acetaminophen]   Social History   Social History  . Marital status: Married    Spouse name: N/A  . Number of children: N/A  . Years of education: N/A   Social History Main Topics  . Smoking status: Never Smoker  . Smokeless tobacco: Never Used  . Alcohol use No  . Drug use: No  . Sexual activity: Yes    Partners: Male    Birth control/ protection: None   Other Topics Concern  . None   Social History Narrative  . None     Family History:  The patient's family history includes ADD / ADHD in her mother; Alcohol abuse in her mother; Dementia in her  mother. She was adopted.   ROS:   Please see the history of present illness.    ROS All other systems reviewed and are negative.  No flowsheet data found.     PHYSICAL EXAM:   VS:  BP (!) 156/52   Pulse 81   Ht 5' (1.524 m)   Wt 221 lb 12.8 oz (100.6 kg)   LMP 06/25/2008   SpO2 97%   BMI 43.32 kg/m    GEN: Well nourished, well developed, in no acute distress  HEENT: normal  Neck: no JVD, carotid bruits, or masses Cardiac: RRR; no murmurs, rubs, or gallops,no edema.  Intact distal pulses bilaterally.  Respiratory:  clear to auscultation bilaterally, normal work of breathing GI: soft, nontender, nondistended, + BS MS: no deformity or atrophy  Skin: warm and dry, no rash Neuro:  Alert and Oriented x 3, Strength and sensation are intact Psych: euthymic mood, full affect  Wt Readings from Last 3 Encounters:  09/10/16 221 lb 12.8 oz (100.6 kg)  08/28/16 219 lb 9.6 oz (99.6 kg)  07/06/16 216 lb (98 kg)      Studies/Labs Reviewed:   EKG:  EKG is not ordered today.    Recent Labs: 02/06/2016: ALT 20; BUN 37; Creatinine 0.9; Potassium 3.9; Sodium 140 07/06/2016: TSH 2.12   Lipid Panel    Component Value Date/Time   CHOL 147 07/06/2016 1531   TRIG 117 07/06/2016 1531   HDL 63 07/06/2016 1531   CHOLHDL 2.3 07/06/2016 1531   VLDL 23 07/06/2016 1531   LDLCALC 61 07/06/2016 1531    Additional studies/ records that were reviewed today include:  none    ASSESSMENT:    1. OSA (obstructive sleep apnea)   2. Essential hypertension, benign   3. Pulmonary hypertension   4. Morbid obesity (HCC)   5. Edema of extremities      PLAN:  In order of problems listed above:  1. OSA - the patient is tolerating PAP therapy well without any problems. The PAP download was reviewed today and showed an AHI of 0.3/hr on 19 cm H2O with 100% compliance in using more than 4 hours nightly.  The patient has been using and benefiting from CPAP use and will continue to benefit from  therapy.   2.   HTN - BP is borderline controlled on current meds.   Continue Altace, diuretic.  She is on 2 CCB so I will increase amlodipine to 10mg  daily and stop Cardizem.  I have asked her to check her BP daily for a week along with  HR and followup in HTN clinic in 1 week.   3.   Mild to moderate pulmonary HTN with PASP - This is stable and most likely due to OSA.  4.   Obesity - I have encouraged him to get into a routine exercise program and cut back on carbs and portions.   5.   LE edema - secondary to dietary indiscretion with sodium.  This has improved.  I have instructed her to not use any added salt and try to follow a 2gm sodium diet.       Medication Adjustments/Labs and Tests Ordered: Current medicines are reviewed at length with the patient today.  Concerns regarding medicines are outlined above.  Medication changes, Labs and Tests ordered today are listed in the Patient Instructions below.  There are no Patient Instructions on file for this visit.   Signed, Armanda Magic, MD  09/10/2016 3:28 PM    [facility] [address] [place], [place], [place]  [address] Phone: [phone]; Fax: [phone]

## 2016-09-10 NOTE — Patient Instructions (Signed)
Medication Instructions:  1) STOP CARDIZEM 2) INCREASE AMLODIPINE to 10 mg daily  Labwork: None  Testing/Procedures: None  Follow-Up: Your physician recommends that you schedule a follow-up appointment in ONE WEEK in the HTN CLINIC.  Your physician wants you to follow-up in: 6 months with Dr. Mayford Knifeurner. You will receive a reminder letter in the mail two months in advance. If you don't receive a letter, please call our office to schedule the follow-up appointment.   Any Other Special Instructions Will Be Listed Below (If Applicable).     If you need a refill on your cardiac medications before your next appointment, please call your pharmacy.

## 2016-09-11 ENCOUNTER — Telehealth: Payer: Self-pay | Admitting: Family Medicine

## 2016-09-11 MED ORDER — VENLAFAXINE HCL 37.5 MG PO TABS: 37.5000 mg | ORAL_TABLET | Freq: Every day | ORAL | 0 refills | Status: DC

## 2016-09-11 MED FILL — VENLAFAXINE HCL 37.5 MG TAB: 37.5 | 90 days supply | Qty: 90 | Fill #0

## 2016-09-11 NOTE — Telephone Encounter (Signed)
Medication filled to pharmacy as requested.   

## 2016-09-11 NOTE — Telephone Encounter (Signed)
Requesting refill of venlafaxine (EFFEXOR) 37.5 MG tablet.  Pharmacy:  [facility] [place] [address] [phone]) [phone] (Fax)

## 2016-09-19 MED FILL — PROGESTERONE 100 MG CAPSULE: 100 | 90 days supply | Qty: 90 | Fill #0

## 2016-09-20 ENCOUNTER — Ambulatory Visit (INDEPENDENT_AMBULATORY_CARE_PROVIDER_SITE_OTHER): Payer: 59 | Admitting: Pharmacist

## 2016-09-20 VITALS — BP 150/60 | HR 71

## 2016-09-20 DIAGNOSIS — I10 Essential (primary) hypertension: Secondary | ICD-10-CM | POA: Diagnosis not present

## 2016-09-20 MED ORDER — SPIRONOLACTONE 25 MG PO TABS
12.5000 mg | ORAL_TABLET | Freq: Every day | ORAL | 3 refills | Status: DC
Start: 2016-09-20 — End: 2017-06-13

## 2016-09-20 MED FILL — SPIRONOLACTONE 25 MG TABLET: 25 | 60 days supply | Qty: 30 | Fill #0

## 2016-09-20 NOTE — Patient Instructions (Addendum)
Follow up with your Primary Care doctor as needed.   Your blood pressure today is 150/60. New guidelines would like your blood pressure to be <130/80.   Check your blood pressure at home daily (if able) and keep record of the readings.  Take your BP meds as follows: Start spironolactone 12.5 mg (1/2 tablet) daily in the morning.  Change to taking your amlodipine 10 mg in the evening.   Continue: HCTZ 25 mg and ramipril 20 mg daily in the morning.     Bring all of your meds, your BP cuff and your record of home blood pressures to your next appointment.  Exercise as you're able, try to walk approximately 30 minutes per day.  Keep salt intake to a minimum, especially watch canned and prepared boxed foods.  Eat more fresh fruits and vegetables and fewer canned items.  Avoid eating in fast food restaurants.    HOW TO TAKE YOUR BLOOD PRESSURE: . Rest 5 minutes before taking your blood pressure. .  Don't smoke or drink caffeinated beverages for at least 30 minutes before. . Take your blood pressure before (not after) you eat. . Sit comfortably with your back supported and both feet on the floor (don't cross your legs). . Elevate your arm to heart level on a table or a desk. . Use the proper sized cuff. It should fit smoothly and snugly around your bare upper arm. There should be enough room to slip a fingertip under the cuff. The bottom edge of the cuff should be 1 inch above the crease of the elbow. . Ideally, take 3 measurements at one sitting and record the average.

## 2016-09-20 NOTE — Progress Notes (Signed)
Patient ID: Kayla Brown                 DOB: 11/26/1950                      MRN: [id number]     HPI: Kayla MirzaVickie Ciaramitaro is a 66 y.o. female patient of Dr. Mayford Knifeurner who presents today for hypertension evaluation. PMH includes OSA, HTN, DM, moderate pulmonary HTN (PASP 43mmHg by echo 02/2015)and dyslipidemia. At her most recent visit with Dr. Mayford Knifeurner one week ago, she was found to be on 2 CCBs; her cardizem was discontinued and her amlodipine was increased to 10mg  daily.   She arrives today and states that she and her husband have started to make dietary modifications since she saw Dr. Mayford Knifeurner. She denies dizziness, lightheadedness, or any symptoms related to hypotension. No significant edema since increasing the amlodipine.   Patient is alarmed by the new blood pressure goals of <130/80. She knows that previously they had recommended <140/90. At first, she was unwilling to start a new medication, and wanted to try lifestyle modifications to control her blood pressure. She acknowledges that she will likely be unable to reach goal with dietary/exercise changes, and conceded that a new medication is needed.   Current HTN meds:  Amlodipine 10mg  daily- AM Hydrochlorothiazide 25 mg - AM Ramipril 20mg  daily - AM  BP goal: <130/80   Family History: She was adopted.   Social History: Never smoked. Does not drink alcohol.   Diet: Dr. Mayford Knifeurner counseled on sodium restriction of 2 g/day last week, and she has started avoiding adding salt to her meals. She is trying to cook more meals at home, but she and her husband have typically been going out to fast food restaurants. She did not have caffeine this morning.  Exercise: Hasn't been walking like she knows she should, but is planning on increasing this as the weather gets better. She is planning to start walking for 15 minutes 3x/week and building up as tolerated.   Wt Readings from Last 3 Encounters:  09/10/16 221 lb 12.8 oz (100.6 kg)  08/28/16 219 lb 9.6 oz  (99.6 kg)  07/06/16 216 lb (98 kg)   BP Readings from Last 3 Encounters:  09/20/16 (!) 150/60  09/10/16 (!) 156/52  08/28/16 (!) 150/60   Pulse Readings from Last 3 Encounters:  09/20/16 71  09/10/16 81  08/28/16 72    Renal function: CrCl cannot be calculated (Patient's most recent lab result is older than the maximum 21 days allowed.).  Past Medical History:  Diagnosis Date  . Anxiety   . Diabetes mellitus   . Edema of extremities 03/02/2016  . Essential hypertension, benign   . Lazy eye   . Major depressive disorder, single episode, unspecified   . Obesity (BMI 30-39.9) 03/01/2016  . OSA (obstructive sleep apnea)   . Pulmonary HTN    mild with PASP 40mmHg by echo 05/2016  . Pure hypercholesterolemia   . Retinopathy, due to hypertension     Current Outpatient Prescriptions on File Prior to Visit  Medication Sig Dispense Refill  . amLODipine (NORVASC) 10 MG tablet Take 1 tablet (10 mg total) by mouth daily. 30 tablet 11  . ramipril (ALTACE) 10 MG capsule Take 2 capsules (20 mg total) by mouth daily. 90 capsule 1  . aspirin 325 MG tablet Take 325 mg by mouth daily.      [name]. atorvastatin (LIPITOR) 40 MG tablet Take 1 tablet (40 mg  total) by mouth daily. 90 tablet 1  . cetirizine (ZYRTEC) 10 MG tablet Take 10 mg by mouth daily.    . Cholecalciferol (VITAMIN D PO) Take 2,000 Int'l Units by mouth daily.     [name] estradiol (ESTRACE) 0.5 MG tablet Take 0.5 tablets (0.25 mg total) by mouth daily. 90 tablet 4  . fish oil-omega-3 fatty acids 1000 MG capsule Take 1 g by mouth daily.    [name] glipiZIDE (GLUCOTROL XL) 5 MG 24 hr tablet Take 1 tablet (5 mg total) by mouth daily with breakfast. 90 tablet 1  . MELATONIN PO Take by mouth.    . metFORMIN (GLUCOPHAGE-XR) 500 MG 24 hr tablet Take 2 tablets (1,000 mg total) by mouth 2 (two) times daily before a meal. 180 tablet 1  . Multiple Vitamins-Minerals (VISION FORMULA) TABS Take 1 tablet by mouth every morning.     [name] OVER THE COUNTER MEDICATION  Equate Vision Formula 50+    . pioglitazone (ACTOS) 30 MG tablet Take 1 tablet (30 mg total) by mouth daily. 90 tablet 1  . progesterone (PROMETRIUM) 100 MG capsule Take 1 capsule (100 mg total) by mouth daily. 90 capsule 4  . TRUE METRIX BLOOD GLUCOSE TEST test strip   3  . TRUEPLUS LANCETS 30G MISC   2  . venlafaxine (EFFEXOR) 37.5 MG tablet Take 1 tablet (37.5 mg total) by mouth daily. 90 tablet 0   No current facility-administered medications on file prior to visit.     Allergies  Allergen Reactions  . Erythromycin   . Tequin [Gatifloxacin]   . Tramadol   . Tylenol [Acetaminophen]     Blood pressure (!) 150/60, pulse 71, last menstrual period 06/25/2008, SpO2 97 %.   Assessment/Plan: Hypertension: Uncontrolled, as greater than goal of <130/80. 1. Add spironolactone 12.5 mg daily in the morning. Follow up BMET in 1 week to check potassium.  2. Change amlodipine 10 mg dosing to every evening. Changing at least one of her blood pressure medications to evening dosing could help control blood pressures throughout the day.  Continue ramipril 20 mg and HCTZ 25 mg daily  Patient is planning on purchasing a blood pressure meter. She was advised to write down readings, and bring those to any follow up appointments.   Follow up for BP check in 3-4 weeks. Patient has an appointment with PCP in 4 weeks, and she preferred to have her BP checked and medications adjusted then. We advised to follow up with [place] in the HTN clinic as recommended by Dr. Albertina Parr providers in the future.  Patient was seen with Caroline More, PharmD Candidate 2018.   Thank you, Freddie Apley. Cleatis Polka, PharmD  [facility]  09/20/2016 12:07 PM

## 2016-09-24 ENCOUNTER — Ambulatory Visit: Payer: 59

## 2016-09-26 ENCOUNTER — Other Ambulatory Visit: Payer: 59 | Admitting: *Deleted

## 2016-09-26 DIAGNOSIS — I10 Essential (primary) hypertension: Secondary | ICD-10-CM

## 2016-09-26 LAB — BASIC METABOLIC PANEL
BUN/Creatinine Ratio: 42 — ABNORMAL HIGH (ref 12–28)
BUN: 39 mg/dL — ABNORMAL HIGH (ref 8–27)
CO2: 20 mmol/L (ref 18–29)
Calcium: 10.2 mg/dL (ref 8.7–10.3)
Chloride: 103 mmol/L (ref 96–106)
Creatinine, Ser: 0.92 mg/dL (ref 0.57–1.00)
GFR calc Af Amer: 76 mL/min/{1.73_m2} (ref >59)
GFR calc non Af Amer: 66 mL/min/{1.73_m2} (ref >59)
Glucose: 80 mg/dL (ref 65–99)
Potassium: 4.4 mmol/L (ref 3.5–5.2)
Sodium: 142 mmol/L (ref 134–144)

## 2016-10-01 ENCOUNTER — Ambulatory Visit: Payer: 59 | Attending: Family Medicine | Admitting: Audiology

## 2016-10-01 DIAGNOSIS — Z01118 Encounter for examination of ears and hearing with other abnormal findings: Secondary | ICD-10-CM | POA: Diagnosis not present

## 2016-10-01 DIAGNOSIS — R292 Abnormal reflex: Secondary | ICD-10-CM | POA: Diagnosis not present

## 2016-10-01 DIAGNOSIS — H93299 Other abnormal auditory perceptions, unspecified ear: Secondary | ICD-10-CM | POA: Insufficient documentation

## 2016-10-01 DIAGNOSIS — R94128 Abnormal results of other function studies of ear and other special senses: Secondary | ICD-10-CM | POA: Insufficient documentation

## 2016-10-01 DIAGNOSIS — H903 Sensorineural hearing loss, bilateral: Secondary | ICD-10-CM | POA: Diagnosis not present

## 2016-10-01 DIAGNOSIS — R2689 Other abnormalities of gait and mobility: Secondary | ICD-10-CM | POA: Insufficient documentation

## 2016-10-01 DIAGNOSIS — R42 Dizziness and giddiness: Secondary | ICD-10-CM | POA: Insufficient documentation

## 2016-10-01 DIAGNOSIS — R2681 Unsteadiness on feet: Secondary | ICD-10-CM | POA: Diagnosis not present

## 2016-10-01 NOTE — Procedures (Signed)
[facility]  [address]  [place], [place] [address]  [phone]   Audiological Evaluation  Patient Name: Kayla Brown   Status: Outpatient   DOB: 11-Sep-1950    Diagnosis: Hearing Loss              MRN: [id number] Date:  10/01/2016     Referent: Neena Rhymes, MD  History: Memorie Yokoyama was seen for an audiological evaluation. Accompanied by: Her husband Primary Concern: Hearing loss, can't hear on TV, sometimes doesn't hear her husband talking. Pain: None History of hearing problems: Y - for the past 3-4 years.  History of ear infections:  Y - swimmer's ear 2-3 times in the past. As a young child had some ear infections. History of ear surgery or "tubes" : N History of dizziness/vertigo:   Y - "always has trouble with dizziness". Woke up last week, started getting dizzy but then it went away. Yesterday and day before were dizzy all day. Spinning toward the left ear. History of balance issues:  Y  - for past 1 to 1 1/2 weeks. Tinnitus: A little bit, but then it quickly goes away.  Sound sensitivity: Y - loud sounds bother me.  History of occupational noise exposure: Y - worked in [employer] in the 1970's - it was noisy then. Quit working in 2005. History of hypertension: Y - has been on new hypertension medication about one week - felt dizzy a little before starting this medication.  History of diabetes: Y - takes "2-3 tablets".  Noticed that blood sugar was up "2 days ago- but it has been fine the past 2 days".   Family history of hearing loss:  Unknown - adopted.     Evaluation: Conventional pure tone audiometry from  -  with using insert earphones.  Hearing Thresholds show symmetrical results of 25-30 dBHL at ; 20-25 dBHL from  - ; 30-35 dBHL at  and 55-60 dBHL from - . The hearing loss is sensorineural bilaterally.  Reliability is good Speech reception levels (repeating words near threshold)  using recorded spondee word lists:  Right ear: 35 dBHL.  Left ear:  35 dBHL Word recognition (at comfortably loud volumes) using recorded word lists at 75 dBHL (very loud, almost shouting volume), in quiet.  Right ear: 100%.  Left ear:   92% Word recognition in minimal background noise:  +5 dBHL  Right ear: 76%                              Left ear:  50%  Tympanometry shows normal middle ear volume, pressure and compliance bilaterally (Type A). Acoustic reflexes are 85 dBHL at  -  and are abnormal (elevated or absent) at  -  bilaterally.  CONCLUSION:      Makai Agostinelli has a slight to borderline mild low frequency hearing loss, sharply sloping to a moderately severe high frequency sensorineural hearing loss bilaterally.   This amount of hearing loss would adversely affect speech communication at normal conversational speech levels. Caryl is a candidate for hearing aids, amplification. Since she has difficulty hearing on the telephone she may be a candidate for an EDS (free hearing aid) in one ear. Quamesha Mullet was encouraged to call a provider, [facility], for details.   Word recognition is excellent in each ear in quiet at conversational speech levels. In minimal background noise, word recognition drops to fair in the right ear  and is poor in the left ear. Martia reports significant vertigo (spinning toward the left side) with occasional tinnitus.  She is unsteady with walking and changing positions. The test results were discussed and Elah Avellino counseled.   RECOMMENDATIONS: 1.   Monitor hearing closely with a repeat audiological evaluation in 6 months (earlier if there is any change in hearing or ear pressure).  This appointment may be completed here, with an ENT or where the hearing aid has been fit.  2.   Consider further evaluation of the vertigo by an Ear, Nose and Throat physician. 3.   Referral to the [facility] PT for Balance Assessment and to  rule out BPPV contributing to vertigo.  4.  Strategies that help improve hearing include: A) Face the speaker directly. Optimal is having the speakers face well - lit.  Unless amplified, being within 3-6 feet of the speaker will enhance word recognition. B) Avoid having the speaker back-lit as this will minimize the ability to use cues from lip-reading, facial expression and gestures. C)  Word recognition is poorer in background noise. For optimal word recognition, turn off the TV, radio or noisy fan when engaging in conversation. In a restaurant, try to sit away from noise sources and close to the primary speaker.  D)  Ask for topic clarification from time to time in order to remain in the conversation.  Most people don't mind repeating or clarifying a point when asked.  If needed, explain the difficulty hearing in background noise or hearing loss. 5.   Use hearing protection during noisy activities such as using a weed eater, moving the lawn, shooting, etc.    Musician's plugs, are available from [employer] for music related hearing protection because there is no distortion.  Other hearing protection, such as sponge plugs (available at pharmacies) or earmuffs (available at sporting goods stores or department stores such as [employer]) are useful for noisy activities and venues.   Tywan Siever L. Kate Sable, Au.D., CCC-A Doctor of Audiology 10/01/2016

## 2016-10-02 ENCOUNTER — Encounter: Payer: Self-pay | Admitting: Family Medicine

## 2016-10-02 ENCOUNTER — Ambulatory Visit (INDEPENDENT_AMBULATORY_CARE_PROVIDER_SITE_OTHER): Payer: 59 | Admitting: Family Medicine

## 2016-10-02 VITALS — BP 139/60 | HR 71 | Temp 97.9°F | Resp 16 | Ht 60.0 in | Wt 217.1 lb

## 2016-10-02 DIAGNOSIS — I10 Essential (primary) hypertension: Secondary | ICD-10-CM

## 2016-10-02 DIAGNOSIS — R42 Dizziness and giddiness: Secondary | ICD-10-CM | POA: Diagnosis not present

## 2016-10-02 DIAGNOSIS — M5442 Lumbago with sciatica, left side: Secondary | ICD-10-CM | POA: Diagnosis not present

## 2016-10-02 MED ORDER — MECLIZINE HCL 25 MG PO TABS: 25.0000 mg | ORAL_TABLET | Freq: Three times a day (TID) | ORAL | 0 refills | Status: DC | PRN

## 2016-10-02 MED ORDER — CYCLOBENZAPRINE HCL 10 MG PO TABS: 10.0000 mg | ORAL_TABLET | Freq: Three times a day (TID) | ORAL | 0 refills | Status: DC | PRN

## 2016-10-02 MED FILL — MECLIZINE 25 MG TABLET: 25 | 10 days supply | Qty: 30 | Fill #0

## 2016-10-02 MED FILL — CYCLOBENZAPRINE 10 MG TAB: 10 | 10 days supply | Qty: 30 | Fill #0

## 2016-10-02 NOTE — Progress Notes (Signed)
Pre visit review using our clinic review tool, if applicable. No additional management support is needed unless otherwise documented below in the visit note. 

## 2016-10-02 NOTE — Progress Notes (Signed)
   Subjective:    Patient ID: Kayla Brown, female    DOB: Feb 21, 1951, 66 y.o.   MRN: [id number]  HPI Dizziness- pt reports sxs started 'about 1-2 weeks ago'.  sxs were most noticeable when getting up in the AM.  sxs persisted Saturday and Sunday throughout the day.  Denies vertigo, sensation of being 'off balance'.  sxs don't change w/ turning her head.  sxs are intermittent.  Intermittent, mild nausea.  Pt admits to limited fluid intake.  Cards added Spironolactone ~12 days ago.  Denies dizziness when sitting, 'it's just when I get up and move'.  Saturday sxs were worse w/ moving head.  Back pain- pt was walking 2-3 weeks ago when she developed intermittent back pain.  'yesterday was real bad.  Today, not so bad.'.  Pain is center and L sided.  Will occasionally radiate down L leg.  Some relief w/ tylenol and ibuprofen, heat, left over muscle relaxers.   Review of Systems For ROS see HPI     Objective:   Physical Exam  Constitutional: She is oriented to person, place, and time. She appears well-developed and well-nourished. No distress.  obese  HENT:  Head: Normocephalic and atraumatic.  Right Ear: External ear normal.  Left Ear: External ear normal.  Nose: Nose normal.  Mouth/Throat: Oropharynx is clear and moist. No oropharyngeal exudate.  TMs WNL bilaterally  Eyes: Conjunctivae are normal. Pupils are equal, round, and reactive to light. Right eye exhibits no discharge. Left eye exhibits no discharge.  Pt w/ nystagmus of both eyes w/ both horizontal and vertical gaze.  Neck: Normal range of motion. Neck supple.  Cardiovascular: Normal rate, regular rhythm and normal heart sounds.   Pulmonary/Chest: Effort normal and breath sounds normal. No respiratory distress. She has no wheezes. She has no rales.  Musculoskeletal: She exhibits tenderness (TTP over L paraspinal muscles). She exhibits no edema.  Neurological: She is alert and oriented to person, place, and time. She has normal  reflexes. No cranial nerve deficit. Coordination normal.  Skin: Skin is warm and dry.  Psychiatric: She has a normal mood and affect. Her behavior is normal. Thought content normal.  Vitals reviewed.         Assessment & Plan:  Vertigo- new.  Pt's intermittent, episodic dizziness is consistent w/ vertigo- worse in AM, initially worse w/ turning head, resolving spontaneously.  + nystagmus on PE.  Ambulating w/o difficulty but hx of balance issues.  Start Meclizine prn.  Encouraged increased fluid intake.  Refer to PT.  Reviewed supportive care and red flags that should prompt return.  Pt expressed understanding and is in agreement w/ plan.   L sided low back pain- new.  Based on pt's reported improvement w/ muscle relaxers and heat, this is most likely muscular in nature.  Add tylenol/ibuprofen prn.  Refer to PT.  Reviewed supportive care and red flags that should prompt return.  Pt expressed understanding and is in agreement w/ plan.   HTN- chronic problem.  Check BMP given recent addition of spironolactone.  Will continue to follow.

## 2016-10-02 NOTE — Patient Instructions (Signed)
Follow up as scheduled We'll notify you of your lab results and make any changes if needed Increase your water intake Start the Meclizine as needed for dizziness We'll call you with your physical therapy appointment Use the Cyclobenzaprine (flexeril) as needed for back pain Use tylenol/ibuprofen as needed for back pain HEAT!! Call with any questions or concerns Hang in there!!!

## 2016-10-03 ENCOUNTER — Other Ambulatory Visit: Payer: 59

## 2016-10-03 LAB — CBC WITH DIFFERENTIAL/PLATELET
Basophils Absolute: 0.1 10*3/uL (ref 0.0–0.1)
Basophils Relative: 0.8 % (ref 0.0–3.0)
Eosinophils Absolute: 0.3 10*3/uL (ref 0.0–0.7)
Eosinophils Relative: 3.1 % (ref 0.0–5.0)
HCT: 37.9 % (ref 36.0–46.0)
Hemoglobin: 12.4 g/dL (ref 12.0–15.0)
Lymphocytes Relative: 25.6 % (ref 12.0–46.0)
Lymphs Abs: 2.7 10*3/uL (ref 0.7–4.0)
MCHC: 32.7 g/dL (ref 30.0–36.0)
MCV: 87.1 fl (ref 78.0–100.0)
Monocytes Absolute: 1.2 10*3/uL — ABNORMAL HIGH (ref 0.1–1.0)
Monocytes Relative: 11.8 % (ref 3.0–12.0)
Neutro Abs: 6.1 10*3/uL (ref 1.4–7.7)
Neutrophils Relative %: 58.7 % (ref 43.0–77.0)
Platelets: 277 10*3/uL (ref 150.0–400.0)
RBC: 4.36 Mil/uL (ref 3.87–5.11)
RDW: 16.9 % — ABNORMAL HIGH (ref 11.5–15.5)
WBC: 10.4 10*3/uL (ref 4.0–10.5)

## 2016-10-03 LAB — BASIC METABOLIC PANEL
BUN: 30 mg/dL — ABNORMAL HIGH (ref 6–23)
CO2: 25 mEq/L (ref 19–32)
Calcium: 10.5 mg/dL (ref 8.4–10.5)
Chloride: 105 mEq/L (ref 96–112)
Creatinine, Ser: 0.9 mg/dL (ref 0.40–1.20)
GFR: 66.89 mL/min (ref >60.00)
Glucose, Bld: 115 mg/dL — ABNORMAL HIGH (ref 70–99)
Potassium: 5.9 mEq/L — ABNORMAL HIGH (ref 3.5–5.1)
Sodium: 140 mEq/L (ref 135–145)

## 2016-10-05 ENCOUNTER — Encounter: Payer: Self-pay | Admitting: Family Medicine

## 2016-10-05 DIAGNOSIS — E875 Hyperkalemia: Secondary | ICD-10-CM

## 2016-10-08 ENCOUNTER — Other Ambulatory Visit (INDEPENDENT_AMBULATORY_CARE_PROVIDER_SITE_OTHER): Payer: 59

## 2016-10-08 ENCOUNTER — Other Ambulatory Visit: Payer: Self-pay

## 2016-10-08 DIAGNOSIS — E875 Hyperkalemia: Secondary | ICD-10-CM | POA: Diagnosis not present

## 2016-10-08 LAB — BASIC METABOLIC PANEL
BUN: 29 mg/dL — ABNORMAL HIGH (ref 6–23)
CO2: 24 mEq/L (ref 19–32)
Calcium: 9.7 mg/dL (ref 8.4–10.5)
Chloride: 105 mEq/L (ref 96–112)
Creatinine, Ser: 0.92 mg/dL (ref 0.40–1.20)
GFR: 65.21 mL/min (ref >60.00)
Glucose, Bld: 177 mg/dL — ABNORMAL HIGH (ref 70–99)
Potassium: 4.8 mEq/L (ref 3.5–5.1)
Sodium: 139 mEq/L (ref 135–145)

## 2016-10-08 NOTE — Patient Outreach (Signed)
[facility]) Care Management  10/08/2016  Kayla Brown 09/30/1950 [id number]  Subjective: Patient presents today for 3 month diabetes follow-up as part of the employer-sponsored Link to Wellness program.  Current diabetes regimen includes glipizide, metformin, and pioglitazone.  Patient also continues on daily ramipril and atorvastatin.  Dr. Mayford Knife recently discontinued ASA 325 mg due to excessive bruising and lack of MI protection in women per patient.  Most recent MD follow-up was 07/06/16.  Patient has a pending appt for 10/08/16 with Dr. Beverely Low.  Amlodipine was recently increased from 5 to 10 mg and hormones have been decreased to every other day dosing, but no major health changes at this time.   Patient reported dietary habits: Eats 3 meals/day Breakfast: PB/jelly (low sugar)/cream cheese (low fat/low sugar) on toast, omelet bowls Lunch: sandwich and fries Dinner: light dinners - fruit, cheese Snacks: fruit, cheese, oatmeal cookies Drinks: coffee, diet soda, water, Gatorade   Patient reported exercise habits: Patient is currently walking ~15-20 min a few times a week. This is down from what she was doing because of a back issue, for which she is taking Flexeril for. Previously, she was walking and climbing stairs frequently.   Patient reports hypoglycemic events. Only 1-2 per month. Report weakness and shakiness. Appropriately treats.  Patient reports nocturia - patient is on diuretics and drinking more fluids per MD request.   Patient denies pain/burning upon urination.  Patient denies neuropathy. Patient denies visual changes. Patient reports self foot exams. No changes or issues reported.   Patient reported self monitored blood glucose frequency daily in AM Home fasting CBG: 30 day average 107  Objective:  Lab Results  Component Value Date   HGBA1C 6.9 (H) 07/06/2016   There were no vitals filed for this visit.  Lipid Panel     Component Value Date/Time    CHOL 147 07/06/2016 1531   TRIG 117 07/06/2016 1531   HDL 63 07/06/2016 1531   CHOLHDL 2.3 07/06/2016 1531   VLDL 23 07/06/2016 1531   LDLCALC 61 07/06/2016 1531    10 year ASCVD risk: 11.7%   Assessment:  Diabetes: Most recent A1C was 6.9% which is at goal of less than 7%.  Lifestyle improvements:  Physical Activity- seems appropriate given recent back injury. Patient wants to work with PT to build strength and balance. PCP already aware of consult and referral request.   Nutrition- inappropriate startch portion control. Reviewed plate method with patient to limit starches (particularly french fries) to 1/4 of plate, instead of current 1/2 of plate.     Plan/Goals for Next Visit: Please see below goals  Next appointment for Link to Wellness: 6 months   Allie Bossier, PharmD PGY1 Resident 10/08/2016 4:19 PM   [facility] CM Care Plan Problem One     Most Recent Value  Care Plan Problem One  Physical Activity  Role Documenting the Problem One  Clinical Pharmacist  Care Plan for Problem One  Active  [facility] Long Term Goal (31-90 days)  30 minutes of physical activity 3 times/week, ability to climb stairs, work with PT    [facility] CM Care Plan Problem Two     Most Recent Value  Care Plan Problem Two  Portion Control  Role Documenting the Problem Two  Clinical Pharmacist  Care Plan for Problem Two  Active  [facility] Long Term Goal (31-90) days  Approrpiate plate method use - limiting starch portion to 1/4 of plate

## 2016-10-10 ENCOUNTER — Other Ambulatory Visit: Payer: Self-pay | Admitting: Physical Medicine and Rehabilitation

## 2016-10-10 DIAGNOSIS — Z1231 Encounter for screening mammogram for malignant neoplasm of breast: Secondary | ICD-10-CM

## 2016-10-18 ENCOUNTER — Ambulatory Visit (INDEPENDENT_AMBULATORY_CARE_PROVIDER_SITE_OTHER): Payer: 59 | Admitting: Family Medicine

## 2016-10-18 ENCOUNTER — Encounter: Payer: Self-pay | Admitting: Family Medicine

## 2016-10-18 VITALS — BP 134/80 | HR 80 | Temp 98.1°F | Resp 17 | Ht 60.0 in | Wt 218.2 lb

## 2016-10-18 DIAGNOSIS — I10 Essential (primary) hypertension: Secondary | ICD-10-CM

## 2016-10-18 DIAGNOSIS — E119 Type 2 diabetes mellitus without complications: Secondary | ICD-10-CM

## 2016-10-18 NOTE — Assessment & Plan Note (Signed)
Chronic problem.  Well controlled today.  Asymptomatic.  Reviewed recent BMP.  No med changes at this time.  Will follow.

## 2016-10-18 NOTE — Progress Notes (Signed)
Pre visit review using our clinic review tool, if applicable. No additional management support is needed unless otherwise documented below in the visit note. 

## 2016-10-18 NOTE — Assessment & Plan Note (Signed)
Chronic problem.  On Metformin, Actos, Glipizide w/ good control per home CBGs.  UTD on eye exam.  Foot exam done today.  On ACE for renal protection.  Check labs.  Adjust meds prn.

## 2016-10-18 NOTE — Progress Notes (Signed)
   Subjective:    Patient ID: Kayla Brown, female    DOB: 08/11/1950, 66 y.o.   MRN: [id number]  HPI DM- chronic problem, on Metformin, Actos and Glipizide.  UTD on foot exam, eye exam.  On ACE for renal protection.  Checking home CBGs- fasting CBGs 130s or less.  Denies symptomatic lows.  No CP, SOB, HAs, visual changes, edema.  No numbness/tingling of hands/feet.  No abd pain, N/V.  HTN- chronic problem, on Spironolactone , Ramipril , HCTZ 12.5mg , Amlodipine .  Pt told cards that she would prefer I manage her BP.   Review of Systems For ROS see HPI     Objective:   Physical Exam  Constitutional: She is oriented to person, place, and time. She appears well-developed and well-nourished. No distress.  HENT:  Head: Normocephalic and atraumatic.  Eyes: Conjunctivae and EOM are normal. Pupils are equal, round, and reactive to light.  Neck: Normal range of motion. Neck supple. No thyromegaly present.  Cardiovascular: Normal rate, regular rhythm, normal heart sounds and intact distal pulses.   No murmur heard. Pulmonary/Chest: Effort normal and breath sounds normal. No respiratory distress.  Abdominal: Soft. She exhibits no distension. There is no tenderness.  Musculoskeletal: She exhibits no edema.  Lymphadenopathy:    She has no cervical adenopathy.  Neurological: She is alert and oriented to person, place, and time.  Skin: Skin is warm and dry.  Psychiatric: She has a normal mood and affect. Her behavior is normal.  Vitals reviewed.         Assessment & Plan:

## 2016-10-18 NOTE — Patient Instructions (Addendum)
Schedule your complete physical in 3-4 months We'll notify you of your A1C and make any changes if needed Continue to work on healthy diet and regular exercise- you can do it! Call with any questions or concerns Happy [place]!!!

## 2016-10-19 ENCOUNTER — Encounter: Payer: Self-pay | Admitting: General Practice

## 2016-10-19 LAB — HEMOGLOBIN A1C
Hgb A1c MFr Bld: 7 % — ABNORMAL HIGH (ref <5.7)
Mean Plasma Glucose: 154 mg/dL

## 2016-10-22 ENCOUNTER — Encounter: Payer: Self-pay | Admitting: Physical Therapy

## 2016-10-22 ENCOUNTER — Ambulatory Visit: Payer: 59 | Admitting: Physical Therapy

## 2016-10-22 DIAGNOSIS — R2689 Other abnormalities of gait and mobility: Secondary | ICD-10-CM

## 2016-10-22 DIAGNOSIS — H903 Sensorineural hearing loss, bilateral: Secondary | ICD-10-CM | POA: Diagnosis not present

## 2016-10-22 DIAGNOSIS — H93299 Other abnormal auditory perceptions, unspecified ear: Secondary | ICD-10-CM | POA: Diagnosis not present

## 2016-10-22 DIAGNOSIS — R292 Abnormal reflex: Secondary | ICD-10-CM | POA: Diagnosis not present

## 2016-10-22 DIAGNOSIS — Z01118 Encounter for examination of ears and hearing with other abnormal findings: Secondary | ICD-10-CM | POA: Diagnosis not present

## 2016-10-22 DIAGNOSIS — R42 Dizziness and giddiness: Secondary | ICD-10-CM

## 2016-10-22 DIAGNOSIS — R2681 Unsteadiness on feet: Secondary | ICD-10-CM | POA: Diagnosis not present

## 2016-10-22 DIAGNOSIS — R94128 Abnormal results of other function studies of ear and other special senses: Secondary | ICD-10-CM | POA: Diagnosis not present

## 2016-10-23 NOTE — Therapy (Signed)
[facility] [address] [place], [place], [address] Phone: [phone]   Fax:  [phone]  Physical Therapy Evaluation  Patient Details  Name: Kayla Brown MRN: [id number] Date of Birth: 17-Dec-1950 Referring Provider: Dr. Neena Rhymes  Encounter Date: 10/22/2016      PT End of Session - 10/23/16 1001    Visit Number 1   Number of Visits 9   Authorization Type UMR   PT Start Time 1148   PT Stop Time 1236   PT Time Calculation (min) 48 min      Past Medical History:  Diagnosis Date  . Anxiety   . Diabetes mellitus   . Edema of extremities 03/02/2016  . Essential hypertension, benign   . Lazy eye   . Major depressive disorder, single episode, unspecified   . Obesity (BMI 30-39.9) 03/01/2016  . OSA (obstructive sleep apnea)   . Pulmonary HTN (HCC)    mild with PASP by echo 05/2016  . Pure hypercholesterolemia   . Retinopathy, due to hypertension     Past Surgical History:  Procedure Laterality Date  . CPAP TITRATION  10/14/2015  . DILATION AND CURETTAGE OF UTERUS  2005-2008   x2  . EYE SURGERY     cataracts  . WISDOM TOOTH EXTRACTION      There were no vitals filed for this visit.       Subjective Assessment - 10/23/16 0948    Subjective Pt states she injured her back about 3-4 weeks ago after walking up a flight of stairs - describes pain in Lt hip radiating down left leg; reports she had vertigo (severe episode on 09-29-16) and went to [facility] on 10-02-16 - got Meclizine but has not taken this medication within past week; wants exercises to get stronger and to get in better shape                                                           Patient is accompained by: Family member  spouse - Conservation officer, nature   Pertinent History Pulmonary HTN, retinopathy due to HTN, DM, OSA, h/o sciatica   Patient Stated Goals Improve balance and mobility; make sure vertigo is resolved; climb steps easier   Currently in Pain?  No/denies            [facility] PT Assessment - 10/23/16 0950      Assessment   Medical Diagnosis Vertigo:  Gait abnormality   Referring Provider Dr. Neena Rhymes   Onset Date/Surgical Date --  late March 2018   Prior Therapy None     Balance Screen   Has the patient fallen in the past 6 months Yes   How many times? 1   Has the patient had a decrease in activity level because of a fear of falling?  No   Is the patient reluctant to leave their home because of a fear of falling?  No     Home Environment   Living Environment Private residence   Type of Home Apartment   Home Access Level entry   Home Layout One level            Vestibular Assessment - 10/23/16 0001      Vestibular Assessment   General Observation Pt is a 66 year old lady  with c/o vertigo that initially started at end of March but progressively worsened with acute episode occurring on 09-29-16.  Pt states vertigo is much improved at current time from initial onset.  Pt states she feels the dizziness some but states she is able to function with it being of mild intensity at this time.  Pt states she wants to get stronger and be able to go up and down steps easier.     Symptom Behavior   Type of Dizziness Spinning   Frequency of Dizziness varies - infrequent in occurrence in past week   Duration of Dizziness seconds to minutes   Aggravating Factors Lying supine;Looking up to the ceiling;Mornings   Relieving Factors Rest;Head stationary     Occulomotor Exam   Comment Rt eye laterally deviated     Positional Testing   Dix-Hallpike Dix-Hallpike Right;Dix-Hallpike Left   Sidelying Test Sidelying Right;Sidelying Left     Dix-Hallpike Right   Dix-Hallpike Right Duration none   Dix-Hallpike Right Symptoms No nystagmus     Dix-Hallpike Left   Dix-Hallpike Left Duration none   Dix-Hallpike Left Symptoms No nystagmus     Sidelying Right   Sidelying Right Duration mild dizziness reported   Sidelying Right  Symptoms No nystagmus     Sidelying Left   Sidelying Left Duration mild dizziness   Sidelying Left Symptoms No nystagmus     Positional Sensitivities   Up from Right Hallpike Lightheadedness   Up from Left Hallpike Lightheadedness   Positional Sensitivities Comments Up from Rt & Lt sidelying - lightheadedness reported                       PT Education - 10/23/16 1000    Education provided Yes   Education Details Etiology of BPPV - appears to be resolved at this time; informed pt that she could do habituation of repetitive sit to/from Lt sidelying if not fully resolved   Person(s) Educated Patient;Spouse   Methods Explanation;Demonstration   Comprehension Verbalized understanding;Returned demonstration             PT Long Term Goals - 10/23/16 2124      PT LONG TERM GOAL #1   Title Assess TUG and establish goal as appropriate.  11-21-16   Time 4   Period Weeks   Status New     PT LONG TERM GOAL #2   Title Increase gait velocity by at least .8 ft/sec for incr. gait efficiency.   11-21-16   Time 4   Period Weeks   Status New     PT LONG TERM GOAL #3   Title Pt will negotiate steps (4) using a step by step sequence with 1 hand rail.  11-21-16   Time 4   Period Weeks   Status New     PT LONG TERM GOAL #4   Title Pt will perform bed mobility with no c/o vertigo.  11-21-16   Time 4   Period Weeks   Status New     PT LONG TERM GOAL #5   Title Independent in HEP.  11-21-16   Time 4   Period Weeks   Status New               Plan - 10/23/16 2111    Clinical Impression Statement Pt is a 66 year old lady with c/o vertigo that started at end of March and progressively worsened on 09-29-16 with an acute episode of vertigo with nausea.  Pt reports vertigo has improved since this onset but states it may still be present but very mild in intensity.  Pt presents with mild unsteadiness with gait.  She reports she has difficulty going up steps due to leg  weakness.  No nystagmus observed with any positional testing.  PMH includes DM, lazy eye (Rt), pulmonary HTN and retinopathy due to HTN.  Moderate decision making required in POC.                                                                                                                       Rehab Potential Good   PT Frequency 2x / week   PT Duration 4 weeks   PT Treatment/Interventions ADLs/Self Care Home Management;Gait training;Stair training;Therapeutic exercise;Balance training;Neuromuscular re-education;Patient/family education;Vestibular;Therapeutic activities   PT Next Visit Plan do TUG, gait velocity; check LE strength   Consulted and Agree with Plan of Care Patient;Family member/caregiver   Family Member Consulted spouse Cherre Huger      Patient will benefit from skilled therapeutic intervention in order to improve the following deficits and impairments:  Dizziness, Difficulty walking, Decreased strength, Decreased balance  Visit Diagnosis: Dizziness and giddiness - Plan: PT plan of care cert/re-cert  Other abnormalities of gait and mobility - Plan: PT plan of care cert/re-cert     Problem List Patient Active Problem List   Diagnosis Date Noted  . Edema of extremities 03/02/2016  . Morbid obesity (HCC) 03/01/2016  . OSA (obstructive sleep apnea) 07/11/2015  . Pulmonary hypertension (HCC) 05/06/2015  . Heart murmur 03/03/2015  . Essential hypertension, benign 03/18/2014  . Hyperlipidemia 03/18/2014  . Post-menopause on HRT (hormone replacement therapy) 03/18/2014  . Diabetes (HCC) 01/01/2013    Issak Goley, Donavan Burnet, PT 10/23/2016, 9:45 PM  [facility] [address] [place], [place], [address] Phone: [phone]   Fax:  [phone]  Name: Kayla Brown MRN: [id number] Date of Birth: [birth year]

## 2016-10-30 ENCOUNTER — Ambulatory Visit: Payer: 59 | Attending: Family Medicine | Admitting: Physical Therapy

## 2016-10-30 DIAGNOSIS — H8111 Benign paroxysmal vertigo, right ear: Secondary | ICD-10-CM | POA: Diagnosis not present

## 2016-10-30 DIAGNOSIS — R42 Dizziness and giddiness: Secondary | ICD-10-CM | POA: Insufficient documentation

## 2016-10-30 DIAGNOSIS — R2689 Other abnormalities of gait and mobility: Secondary | ICD-10-CM | POA: Diagnosis not present

## 2016-10-30 DIAGNOSIS — R2681 Unsteadiness on feet: Secondary | ICD-10-CM | POA: Diagnosis not present

## 2016-10-30 NOTE — Patient Instructions (Addendum)
Walking Program:  Begin walking for exercise for 15 minutes, 1 times/day, 5 days/week.   Progress your walking program by adding 3 minutes to your routine each week, as tolerated. Be sure to wear good walking shoes, walk in a safe environment and only progress to your tolerance.        Standing Marching   Using a chair if necessary, march in place. Repeat 10 times each leg.  Do 1 sessions per day.  http://gt2.exer.us/344   Copyright  VHI. All rights reserved.   Hip Backward Kick   Using a chair for balance, keep legs shoulder width apart and toes pointed for- ward. Slowly extend one leg back, keeping knee straight. Do not lean forward. Repeat with other leg. Repeat 10 times. Do 1 sessions per day.  http://gt2.exer.us/340   Copyright  VHI. All rights reserved.     Hip Side Kick   Holding a chair for balance, keep legs shoulder width apart and toes pointed forward. Swing a leg out to side, keeping knee straight. Do not lean. Repeat using other leg. Repeat 10 times. Do 1 sessions per day.   ALSO DO FORWARD KICKS - 10 reps each leg http://gt2.exer.us/342   Copyright  VHI. All rights reserved.        Standing On One Leg Without Support .  Stand on one leg in neutral spine without support. Hold 10 seconds. Repeat on other leg. Do 1repetitions, 2 sets.  http://bt.exer.us/36   Copyright  VHI. All rights reserved.    Side-Stepping   Walk to left side with eyes open. Take even steps, leading with same foot. Make sure each foot lifts off the floor. Repeat in opposite direction. Repeat for 1 minutes per session. Do 1 sessions per day. Copyright  VHI. All rights reserved.       Tandem Stance    Right foot in front of left, heel touching toe both feet "straight ahead". Stand on Foot Triangle of Support with both feet. Balance in this position _30ANKLE: Plantarflexion, Bilateral - Standing    Stand with upright posture. Raise heels up as high as  possible. _10__ reps per set, _1__ sets per day, _5__ days per week Hold onto a support.  ALSO ROCK BACK on HEELS to lift toes up - 10 reps Copyright  VHI. All rights reserved.    Copyright  VHI. All rights reserved.  .[name]

## 2016-10-31 NOTE — Therapy (Signed)
[facility] [address] [place], [place], [address] Phone: [phone]   Fax:  [phone]  Physical Therapy Treatment  Patient Details  Name: Kayla Brown MRN: [id number] Date of Birth: Oct 17, 1950 Referring Provider: Dr. Neena Rhymes  Encounter Date: 10/30/2016      PT End of Session - 10/31/16 2213    Visit Number 2   Number of Visits 9   Authorization Type UMR   PT Start Time 1535   PT Stop Time 1622   PT Time Calculation (min) 47 min      Past Medical History:  Diagnosis Date  . Anxiety   . Diabetes mellitus   . Edema of extremities 03/02/2016  . Essential hypertension, benign   . Lazy eye   . Major depressive disorder, single episode, unspecified   . Obesity (BMI 30-39.9) 03/01/2016  . OSA (obstructive sleep apnea)   . Pulmonary HTN (HCC)    mild with PASP by echo 05/2016  . Pure hypercholesterolemia   . Retinopathy, due to hypertension     Past Surgical History:  Procedure Laterality Date  . CPAP TITRATION  10/14/2015  . DILATION AND CURETTAGE OF UTERUS  2005-2008   x2  . EYE SURGERY     cataracts  . WISDOM TOOTH EXTRACTION      There were no vitals filed for this visit.      Subjective Assessment - 10/31/16 2210    Subjective Pt states she walked about 34' with her neighbor yesterday (Monday) but overdid it   Patient is accompained by: Family member   Pertinent History Pulmonary HTN, retinopathy due to HTN, DM, OSA, h/o sciatica   Patient Stated Goals Improve balance and mobility; make sure vertigo is resolved; climb steps easier   Currently in Pain? No/denies             Instructed in balance HEP - see pt instructions  TUG score 15.56 secs  Gait velocity 16.40 secs = 2.0 ft/sec with no device                 Balance Exercises - 10/31/16 2211      Balance Exercises: Standing   SLS Eyes open;3 reps;10 secs   Sidestepping 2 reps   Other Standing  Exercises Pt performed standing forward, back  and side kicks; marhcing in place           PT Education - 10/31/16 2213    Education provided Yes   Education Details balance HEP             PT Long Term Goals - 10/30/16 1556      PT LONG TERM GOAL #1   Title Assess TUG and establish goal as appropriate.  11-21-16; goal </= 13.5 secs   11-21-16   Baseline 15.56 secs   Time 4   Period Weeks   Status New     PT LONG TERM GOAL #2   Title Increase gait velocity by at least .8 ft/sec for incr. gait efficiency.   5-30- incr. to >/= 2.5 ft/sec   Baseline Gait velocity 16.40= 2.0 ft/sec   Time 4   Period Weeks   Status New     PT LONG TERM GOAL #3   Title Pt will negotiate steps (4) using a step by step sequence with 1 hand rail.  11-21-16   Time 4   Period Weeks   Status New     PT LONG TERM GOAL #4  Title Pt will perform bed mobility with no c/o vertigo.  11-21-16   Time 4   Period Weeks   Status New     PT LONG TERM GOAL #5   Title Independent in HEP.  11-21-16   Period Weeks   Status New               Plan - 10/31/16 2213    Clinical Impression Statement Pt reports vertigo is improved and seems to occur spontaneously and lasts only a few secs; pt needs UE support for safety with standing balance exercises   Rehab Potential Good   PT Frequency 2x / week   PT Duration 4 weeks   PT Treatment/Interventions ADLs/Self Care Home Management;Gait training;Stair training;Therapeutic exercise;Balance training;Neuromuscular re-education;Patient/family education;Vestibular;Therapeutic activities      Patient will benefit from skilled therapeutic intervention in order to improve the following deficits and impairments:  Dizziness, Difficulty walking, Decreased strength, Decreased balance  Visit Diagnosis: Other abnormalities of gait and mobility     Problem List Patient Active Problem List   Diagnosis Date Noted  . Edema of extremities 03/02/2016  . Morbid  obesity (HCC) 03/01/2016  . OSA (obstructive sleep apnea) 07/11/2015  . Pulmonary hypertension (HCC) 05/06/2015  . Heart murmur 03/03/2015  . Essential hypertension, benign 03/18/2014  . Hyperlipidemia 03/18/2014  . Post-menopause on HRT (hormone replacement therapy) 03/18/2014  . Diabetes (HCC) 01/01/2013    Hortence Charter, Donavan BurnetLinda Suzanne, PT 10/31/2016, 10:21 PM  [facility] [address] [place], [place], [address] Phone: [phone]   Fax:  [phone]  Name: Maple MirzaVickie Verry MRN: [id number] Date of Birth: 08/22/1950

## 2016-11-01 DIAGNOSIS — H903 Sensorineural hearing loss, bilateral: Secondary | ICD-10-CM | POA: Diagnosis not present

## 2016-11-02 ENCOUNTER — Ambulatory Visit
Admission: RE | Admit: 2016-11-02 | Discharge: 2016-11-02 | Disposition: A | Discharge status: Home / Self Care | Payer: 59 | Source: Ambulatory Visit | Attending: Physical Medicine and Rehabilitation | Admitting: Physical Medicine and Rehabilitation

## 2016-11-02 ENCOUNTER — Other Ambulatory Visit: Payer: Self-pay | Admitting: Obstetrics & Gynecology

## 2016-11-02 DIAGNOSIS — Z1231 Encounter for screening mammogram for malignant neoplasm of breast: Secondary | ICD-10-CM

## 2016-11-05 ENCOUNTER — Other Ambulatory Visit: Payer: Self-pay | Admitting: Obstetrics & Gynecology

## 2016-11-05 DIAGNOSIS — R928 Other abnormal and inconclusive findings on diagnostic imaging of breast: Secondary | ICD-10-CM

## 2016-11-06 ENCOUNTER — Ambulatory Visit: Payer: 59 | Admitting: Physical Therapy

## 2016-11-06 DIAGNOSIS — R42 Dizziness and giddiness: Secondary | ICD-10-CM | POA: Diagnosis not present

## 2016-11-06 DIAGNOSIS — H8111 Benign paroxysmal vertigo, right ear: Secondary | ICD-10-CM | POA: Diagnosis not present

## 2016-11-06 DIAGNOSIS — R2689 Other abnormalities of gait and mobility: Secondary | ICD-10-CM | POA: Diagnosis not present

## 2016-11-06 DIAGNOSIS — R2681 Unsteadiness on feet: Secondary | ICD-10-CM | POA: Diagnosis not present

## 2016-11-07 NOTE — Therapy (Signed)
[facility] [address] [place], [place], [address] Phone: [phone]   Fax:  [phone]  Physical Therapy Treatment  Patient Details  Name: Kayla Brown MRN: [id number] Date of Birth: 1950/08/18 Referring Provider: Dr. Neena Rhymes  Encounter Date: 11/06/2016      PT End of Session - 11/07/16 1903    Visit Number 3   Number of Visits 9   Authorization Type UMR   PT Start Time 1534   PT Stop Time 1620   PT Time Calculation (min) 46 min      Past Medical History:  Diagnosis Date  . Anxiety   . Diabetes mellitus   . Edema of extremities 03/02/2016  . Essential hypertension, benign   . Lazy eye   . Major depressive disorder, single episode, unspecified   . Obesity (BMI 30-39.9) 03/01/2016  . OSA (obstructive sleep apnea)   . Pulmonary HTN (HCC)    mild with PASP by echo 05/2016  . Pure hypercholesterolemia   . Retinopathy, due to hypertension     Past Surgical History:  Procedure Laterality Date  . CPAP TITRATION  10/14/2015  . DILATION AND CURETTAGE OF UTERUS  2005-2008   x2  . EYE SURGERY     cataracts  . WISDOM TOOTH EXTRACTION      There were no vitals filed for this visit.      Subjective Assessment - 11/07/16 1859    Subjective Pt states she can tell the exercises are really helping her already   Patient is accompained by: Family member   Pertinent History Pulmonary HTN, retinopathy due to HTN, DM, OSA, h/o sciatica   Patient Stated Goals Improve balance and mobility; make sure vertigo is resolved; climb steps easier   Currently in Pain? No/denies         NeuroRe-ed:    reviewed balance HEP - SLS each leg for 10 sec hold:  Tandem (partial) stance 30 sec hold each position Marchng in place, sidestepping along // bars; forward, back and side kicks 10 reps each leg Ankle sways 10 reps each with UE support prn              [facility] Adult PT Treatment/Exercise - 11/07/16  0001      Ambulation/Gait   Ambulation/Gait Yes   Gait Pattern Step-through pattern   Gait Comments Pt amb. 3" at 1.0 mph on treadmill     Exercises   Exercises Knee/Hip     Knee/Hip Exercises: Aerobic   Recumbent Bike SciFit level 2.0 x 5"      step negotiation (4) x 2 reps with min hand held assist on rail - step over step sequence with SBA; cues to let toes hang over edge of step           PT Education - 11/07/16 1903    Education provided Yes   Education Details added standing forward kicks to [employer]) Educated Patient   Methods Explanation;Demonstration   Comprehension Verbalized understanding;Returned demonstration             PT Long Term Goals - 10/30/16 1556      PT LONG TERM GOAL #1   Title Assess TUG and establish goal as appropriate.  11-21-16; goal </= 13.5 secs   11-21-16   Baseline 15.56 secs   Time 4   Period Weeks   Status New     PT LONG TERM GOAL #2   Title Increase gait velocity by  at least .8 ft/sec for incr. gait efficiency.   5-30- incr. to >/= 2.5 ft/sec   Baseline Gait velocity 16.40= 2.0 ft/sec   Time 4   Period Weeks   Status New     PT LONG TERM GOAL #3   Title Pt will negotiate steps (4) using a step by step sequence with 1 hand rail.  11-21-16   Time 4   Period Weeks   Status New     PT LONG TERM GOAL #4   Title Pt will perform bed mobility with no c/o vertigo.  11-21-16   Time 4   Period Weeks   Status New     PT LONG TERM GOAL #5   Title Independent in HEP.  11-21-16   Period Weeks   Status New               Plan - 11/07/16 1904    Clinical Impression Statement Pt reports very minimal intermittent vertigo which she says is much less frequent in occurrence at this time:  pt demonstrating improved mobility and states negotiating steps is much easier than it was at time of eval; pt cont to demo decr. high level balance skills   Rehab Potential Good   PT Frequency 2x / week   PT Duration 4 weeks   PT  Treatment/Interventions ADLs/Self Care Home Management;Gait training;Stair training;Therapeutic exercise;Balance training;Neuromuscular re-education;Patient/family education;Vestibular;Therapeutic activities   PT Next Visit Plan do TUG, gait velocity; check LE strength   Consulted and Agree with Plan of Care Patient;Family member/caregiver   Family Member Consulted spouse Cherre HugerMack      Patient will benefit from skilled therapeutic intervention in order to improve the following deficits and impairments:  Dizziness, Difficulty walking, Decreased strength, Decreased balance  Visit Diagnosis: Other abnormalities of gait and mobility     Problem List Patient Active Problem List   Diagnosis Date Noted  . Edema of extremities 03/02/2016  . Morbid obesity (HCC) 03/01/2016  . OSA (obstructive sleep apnea) 07/11/2015  . Pulmonary hypertension (HCC) 05/06/2015  . Heart murmur 03/03/2015  . Essential hypertension, benign 03/18/2014  . Hyperlipidemia 03/18/2014  . Post-menopause on HRT (hormone replacement therapy) 03/18/2014  . Diabetes (HCC) 01/01/2013    [address], Donavan BurnetLinda Suzanne, PT 11/07/2016, 7:08 PM  [facility] [address] [place], [place], [address] Phone: [phone]   Fax:  [phone]  Name: Kayla MirzaVickie Brown MRN: [id number] Date of Birth: 10/04/1950

## 2016-11-08 ENCOUNTER — Ambulatory Visit
Admission: RE | Admit: 2016-11-08 | Discharge: 2016-11-08 | Disposition: A | Discharge status: Home / Self Care | Payer: 59 | Source: Ambulatory Visit | Attending: Obstetrics & Gynecology | Admitting: Obstetrics & Gynecology

## 2016-11-08 ENCOUNTER — Other Ambulatory Visit: Payer: Self-pay | Admitting: Obstetrics & Gynecology

## 2016-11-08 DIAGNOSIS — R928 Other abnormal and inconclusive findings on diagnostic imaging of breast: Secondary | ICD-10-CM

## 2016-11-08 DIAGNOSIS — R921 Mammographic calcification found on diagnostic imaging of breast: Secondary | ICD-10-CM

## 2016-11-13 ENCOUNTER — Ambulatory Visit: Payer: 59 | Admitting: Physical Therapy

## 2016-11-13 DIAGNOSIS — R2689 Other abnormalities of gait and mobility: Secondary | ICD-10-CM | POA: Diagnosis not present

## 2016-11-13 DIAGNOSIS — H8111 Benign paroxysmal vertigo, right ear: Secondary | ICD-10-CM | POA: Diagnosis not present

## 2016-11-13 DIAGNOSIS — R2681 Unsteadiness on feet: Secondary | ICD-10-CM | POA: Diagnosis not present

## 2016-11-13 DIAGNOSIS — R42 Dizziness and giddiness: Secondary | ICD-10-CM | POA: Diagnosis not present

## 2016-11-13 NOTE — Patient Instructions (Signed)
Feet Apart (Compliant Surface) Varied Arm Positions - Eyes Open   With eyes open, standing on compliant surface: PILLOW, feet shoulder width apart and arms AT YOUR SIDE, look at a stationary object. Hold  30-45 seconds. Repeat  3  times per session. Do 1-2 sessions per day.  Copyright  VHI. All rights reserved.                         Copyright  VHI. All rights reserved.                         Feet Apart (Compliant Surface) Varied Arm Positions - Eyes Closed   Stand on compliant surface: PILLOW with feet shoulder width apart and arms AT YOUR SIDE. Close eyes and visualize upright position. Hold 30-45 seconds. Repeat 3 times per session. Do 1-2 sessions per day.  Copyright  VHI. All rights reserved.

## 2016-11-14 ENCOUNTER — Other Ambulatory Visit: Payer: Self-pay | Admitting: Family Medicine

## 2016-11-14 MED FILL — glipiZIDE XL 5 MG TB24: 5 | 90 days supply | Qty: 90 | Fill #1

## 2016-11-14 MED FILL — METFORMIN HCL ER 500 MG TAB: 500 | 45 days supply | Qty: 180 | Fill #0

## 2016-11-14 MED FILL — RAMIPRIL 10 MG CAPSULE: 10 | 45 days supply | Qty: 90 | Fill #0

## 2016-11-14 MED FILL — HYDROCHLOROTHIAZIDE 12.5 MG: 12.5 | 90 days supply | Qty: 180 | Fill #0

## 2016-11-14 MED FILL — PIOGLITAZONE HCL 30 MG TAB: 30 | 90 days supply | Qty: 90 | Fill #1

## 2016-11-14 MED FILL — ATORVASTATIN 40 MG TABLET: 40 | 90 days supply | Qty: 90 | Fill #1

## 2016-11-14 MED FILL — SPIRONOLACTONE 25 MG TABLET: 25 | 60 days supply | Qty: 30 | Fill #1

## 2016-11-14 NOTE — Therapy (Signed)
[facility] [address] [place], [place], [address] Phone: [phone]   Fax:  [phone]  Physical Therapy Treatment  Patient Details  Name: Kayla Brown MRN: [id number] Date of Birth: 12/09/1950 Referring Provider: Dr. Neena Rhymes  Encounter Date: 11/13/2016      PT End of Session - 11/14/16 2231    Visit Number 4   Number of Visits 9   Authorization Type UMR   PT Start Time 1538   PT Stop Time 1622   PT Time Calculation (min) 44 min      Past Medical History:  Diagnosis Date  . Anxiety   . Diabetes mellitus   . Edema of extremities 03/02/2016  . Essential hypertension, benign   . Lazy eye   . Major depressive disorder, single episode, unspecified   . Obesity (BMI 30-39.9) 03/01/2016  . OSA (obstructive sleep apnea)   . Pulmonary HTN (HCC)    mild with PASP by echo 05/2016  . Pure hypercholesterolemia   . Retinopathy, due to hypertension     Past Surgical History:  Procedure Laterality Date  . CPAP TITRATION  10/14/2015  . DILATION AND CURETTAGE OF UTERUS  2005-2008   x2  . EYE SURGERY     cataracts  . WISDOM TOOTH EXTRACTION      There were no vitals filed for this visit.      Subjective Assessment - 11/14/16 2228    Subjective Pt states she is doing better - exercises are helping   Patient is accompained by: Family member   Pertinent History Pulmonary HTN, retinopathy due to HTN, DM, OSA, h/o sciatica   Patient Stated Goals Improve balance and mobility; make sure vertigo is resolved; climb steps easier   Currently in Pain? No/denies                         [facility] Adult PT Treatment/Exercise - 11/14/16 0001      Knee/Hip Exercises: Aerobic   Recumbent Bike SciFit level 2.0 x 5"             Balance Exercises - 11/14/16 2228      Balance Exercises: Standing   Standing Eyes Opened Wide (BOA);Head turns;2 reps;10 secs   Standing Eyes Closed Solid  surface;Wide (BOA);2 reps;10 secs   Tandem Stance Eyes open;Upper extremity support 2;2 reps  inside // bars   Rockerboard Anterior/posterior;Lateral;EO;30 seconds;Intermittent UE support   Tandem Gait Forward;2 reps   Sidestepping 2 reps   Other Standing Exercises Pt performed standing forward, back  and side kicks; marhcing in place           PT Education - 11/14/16 2231    Education provided Yes   Education Details see pt instructions   Person(s) Educated Patient   Methods Explanation;Demonstration;Handout   Comprehension Verbalized understanding;Returned demonstration             PT Long Term Goals - 10/30/16 1556      PT LONG TERM GOAL #1   Title Assess TUG and establish goal as appropriate.  11-21-16; goal </= 13.5 secs   11-21-16   Baseline 15.56 secs   Time 4   Period Weeks   Status New     PT LONG TERM GOAL #2   Title Increase gait velocity by at least .8 ft/sec for incr. gait efficiency.   5-30- incr. to >/= 2.5 ft/sec   Baseline Gait velocity 16.40= 2.0 ft/sec   Time  4   Period Weeks   Status New     PT LONG TERM GOAL #3   Title Pt will negotiate steps (4) using a step by step sequence with 1 hand rail.  11-21-16   Time 4   Period Weeks   Status New     PT LONG TERM GOAL #4   Title Pt will perform bed mobility with no c/o vertigo.  11-21-16   Time 4   Period Weeks   Status New     PT LONG TERM GOAL #5   Title Independent in HEP.  11-21-16   Period Weeks   Status New               Plan - 11/14/16 2233    Clinical Impression Statement Pt had difficulty maintaining balance on compliant surface (balance beam) - needed UE support for safety; pt also has decr. SLS   Rehab Potential Good   PT Frequency 2x / week   PT Duration 4 weeks   PT Treatment/Interventions ADLs/Self Care Home Management;Gait training;Stair training;Therapeutic exercise;Balance training;Neuromuscular re-education;Patient/family education;Vestibular;Therapeutic activities    PT Next Visit Plan check goals - D/C?   Consulted and Agree with Plan of Care Patient;Family member/caregiver   Family Member Consulted spouse Cherre HugerMack      Patient will benefit from skilled therapeutic intervention in order to improve the following deficits and impairments:  Dizziness, Difficulty walking, Decreased strength, Decreased balance  Visit Diagnosis: Other abnormalities of gait and mobility     Problem List Patient Active Problem List   Diagnosis Date Noted  . Edema of extremities 03/02/2016  . Morbid obesity (HCC) 03/01/2016  . OSA (obstructive sleep apnea) 07/11/2015  . Pulmonary hypertension (HCC) 05/06/2015  . Heart murmur 03/03/2015  . Essential hypertension, benign 03/18/2014  . Hyperlipidemia 03/18/2014  . Post-menopause on HRT (hormone replacement therapy) 03/18/2014  . Diabetes (HCC) 01/01/2013    Narcissa Melder, Donavan BurnetLinda Suzanne, PT 11/14/2016, 10:36 PM  [facility] [address] [place], [place], [address] Phone: [phone]   Fax:  [phone]  Name: Maple MirzaVickie Mulroy MRN: [id number] Date of Birth: 05/04/1951

## 2016-11-16 ENCOUNTER — Ambulatory Visit
Admission: RE | Admit: 2016-11-16 | Discharge: 2016-11-16 | Disposition: A | Discharge status: Home / Self Care | Payer: 59 | Source: Ambulatory Visit | Attending: Obstetrics & Gynecology | Admitting: Obstetrics & Gynecology

## 2016-11-16 DIAGNOSIS — N6011 Diffuse cystic mastopathy of right breast: Secondary | ICD-10-CM | POA: Diagnosis not present

## 2016-11-16 DIAGNOSIS — R921 Mammographic calcification found on diagnostic imaging of breast: Secondary | ICD-10-CM

## 2016-11-20 ENCOUNTER — Ambulatory Visit: Payer: 59 | Admitting: Physical Therapy

## 2016-11-20 DIAGNOSIS — H8111 Benign paroxysmal vertigo, right ear: Secondary | ICD-10-CM | POA: Diagnosis not present

## 2016-11-20 DIAGNOSIS — R42 Dizziness and giddiness: Secondary | ICD-10-CM

## 2016-11-20 DIAGNOSIS — R2681 Unsteadiness on feet: Secondary | ICD-10-CM | POA: Diagnosis not present

## 2016-11-20 DIAGNOSIS — R2689 Other abnormalities of gait and mobility: Secondary | ICD-10-CM | POA: Diagnosis not present

## 2016-11-20 NOTE — Patient Instructions (Signed)
Sit to Side-Lying    Sit on edge of bed. 1. Turn head 45 to right. 2. Maintain head position and lie down slowly on left side. Hold until symptoms subside. 3. Sit up slowly. Hold until symptoms subside. 4. Turn head 45 to left. 5. Maintain head position and lie down slowly on right side. Hold until symptoms subside. 6. Sit up slowly. Repeat sequence __5__ times per session. Do __3-4   Benign Positional Vertigo Vertigo is the feeling that you or your surroundings are moving when they are not. Benign positional vertigo is the most common form of vertigo. The cause of this condition is not serious (is benign). This condition is triggered by certain movements and positions (is positional). This condition can be dangerous if it occurs while you are doing something that could endanger you or others, such as driving. What are the causes? In many cases, the cause of this condition is not known. It may be caused by a disturbance in an area of the inner ear that helps your brain to sense movement and balance. This disturbance can be caused by a viral infection (labyrinthitis), head injury, or repetitive motion. What increases the risk? This condition is more likely to develop in:  Women.  People who are 2 years of age or older. What are the signs or symptoms? Symptoms of this condition usually happen when you move your head or your eyes in different directions. Symptoms may start suddenly, and they usually last for less than a minute. Symptoms may include:  Loss of balance and falling.  Feeling like you are spinning or moving.  Feeling like your surroundings are spinning or moving.  Nausea and vomiting.  Blurred vision.  Dizziness.  Involuntary eye movement (nystagmus). Symptoms can be mild and cause only slight annoyance, or they can be severe and interfere with daily life. Episodes of benign positional vertigo may return (recur) over time, and they may be triggered by certain movements.  Symptoms may improve over time. How is this diagnosed? This condition is usually diagnosed by medical history and a physical exam of the head, neck, and ears. You may be referred to a health care provider who specializes in ear, nose, and throat (ENT) problems (otolaryngologist) or a provider who specializes in disorders of the nervous system (neurologist). You may have additional testing, including:  MRI.  A CT scan.  Eye movement tests. Your health care provider may ask you to change positions quickly while he or she watches you for symptoms of benign positional vertigo, such as nystagmus. Eye movement may be tested with an electronystagmogram (ENG), caloric stimulation, the Dix-Hallpike test, or the roll test.  An electroencephalogram (EEG). This records electrical activity in your brain.  Hearing tests. How is this treated? Usually, your health care provider will treat this by moving your head in specific positions to adjust your inner ear back to normal. Surgery may be needed in severe cases, but this is rare. In some cases, benign positional vertigo may resolve on its own in 2-4 weeks. Follow these instructions at home: Safety   Move slowly.Avoid sudden body or head movements.  Avoid driving.  Avoid operating heavy machinery.  Avoid doing any tasks that would be dangerous to you or others if a vertigo episode would occur.  If you have trouble walking or keeping your balance, try using a cane for stability. If you feel dizzy or unstable, sit down right away.  Return to your normal activities as told by your health  care provider. Ask your health care provider what activities are safe for you. General instructions   Take over-the-counter and prescription medicines only as told by your health care provider.  Avoid certain positions or movements as told by your health care provider.  Drink enough fluid to keep your urine clear or pale yellow.  Keep all follow-up visits as told  by your health care provider. This is important. Contact a health care provider if:  You have a fever.  Your condition gets worse or you develop new symptoms.  Your family or friends notice any behavioral changes.  Your nausea or vomiting gets worse.  You have numbness or a "pins and needles" sensation. Get help right away if:  You have difficulty speaking or moving.  You are always dizzy.  You faint.  You develop severe headaches.  You have weakness in your legs or arms.  You have changes in your hearing or vision.  You develop a stiff neck.  You develop sensitivity to light. This information is not intended to replace advice given to you by your health care provider. Make sure you discuss any questions you have with your health care provider. Document Released: 03/19/2006 Document Revised: 11/17/2015 Document Reviewed: 10/04/2014 Elsevier Interactive Patient Education  2017 [employer] _ sessions per day.  Copyright  VHI. All rights reserved.

## 2016-11-21 ENCOUNTER — Telehealth: Payer: Self-pay | Admitting: Family Medicine

## 2016-11-21 NOTE — Telephone Encounter (Signed)
Ok to remove as long as pt is certain that these were entered in error as this could complicate things down the road (definitely ok w/ removing tylenol from list if she is able to take)

## 2016-11-21 NOTE — Telephone Encounter (Signed)
Patient states her chart states she is allergic to Tylenol and 2 other meds.  Patient states she is not allergic to any medications that she has been told.  She would like to have this updated in her chart that she has no drug allergies.

## 2016-11-21 NOTE — Therapy (Signed)
[facility] [address] [place], [place], [address] Phone: [phone]   Fax:  [phone]  Physical Therapy Treatment  Patient Details  Name: Kayla Brown MRN: [id number] Date of Birth: 09-19-1950 Referring Provider: Dr. Annye Asa  Encounter Date: 11/20/2016      PT End of Session - 11/21/16 2024    Visit Number 5   Number of Visits 9   Authorization Type UMR   PT Start Time 6381   PT Stop Time 7711   PT Time Calculation (min) 45 min      Past Medical History:  Diagnosis Date  . Anxiety   . Diabetes mellitus   . Edema of extremities 03/02/2016  . Essential hypertension, benign   . Lazy eye   . Major depressive disorder, single episode, unspecified   . Obesity (BMI 30-39.9) 03/01/2016  . OSA (obstructive sleep apnea)   . Pulmonary HTN ([place])    mild with PASP 54mHg by echo 05/2016  . Pure hypercholesterolemia   . Retinopathy, due to hypertension     Past Surgical History:  Procedure Laterality Date  . CPAP TITRATION  10/14/2015  . DILATION AND CURETTAGE OF UTERUS  2005-2008   x2  . EYE SURGERY     cataracts  . WISDOM TOOTH EXTRACTION      There were no vitals filed for this visit.      Subjective Assessment - 11/21/16 2023    Subjective Pt reports vertigo returned on Sunday morning - had it yesterday and this morning - took Meclizine yesterday around noon   Patient is accompained by: Family member   Pertinent History Pulmonary HTN, retinopathy due to HTN, DM, OSA, h/o sciatica   Patient Stated Goals Improve balance and mobility; make sure vertigo is resolved; climb steps easier   Currently in Pain? No/denies       Pt has (+) Rt Dix-Hallpike test with mild nystagmus and moderate c/o vertigo of approx. 30 secs duration in test position   Epley maneuver for Rt BPPV posterior canalithiasis performed 3 reps with pt reporting much improvement in symptoms on 3rd rep     Instructed pt and  husband in habituation exercises of sit to Rt sidelying if vertigo not fully resolved                     PT Education - 11/21/16 2023    Education provided Yes   Education Details pt instructed in Brandt-Daroff exercises for Rt BPPV; also gave BPPV etiology information   Person(s) Educated Patient   Methods Explanation;Handout;Demonstration   Comprehension Verbalized understanding;Returned demonstration             PT Long Term Goals - 11/21/16 2028      PT LONG TERM GOAL #1   Title Assess TUG and establish goal as appropriate.  11-21-16; goal </= 13.5 secs   11-21-16  CONT. LTG's til 12-22-16   Baseline 15.56 secs   Status On-going     PT LONG TERM GOAL #2   Title Increase gait velocity by at least .8 ft/sec for incr. gait efficiency.   5-30- incr. to >/= 2.5 ft/sec   Baseline Gait velocity 16.40= 2.0 ft/sec   Status On-going     PT LONG TERM GOAL #3   Title Pt will negotiate steps (4) using a step by step sequence with 1 hand rail.  11-21-16   Status On-going     PT LONG TERM GOAL #4  Title Pt will perform bed mobility with no c/o vertigo.  11-21-16/  CONTINUE LTG --- 12-22-16 TARGET DATE   Status Not Met     PT LONG TERM GOAL #5   Title Independent in HEP.  11-21-16   Baseline met 11-20-16   Status Achieved     Additional Long Term Goals   Additional Long Term Goals Yes     PT LONG TERM GOAL #6   Title Pt will have a (-) Rt Dix-Hallpike test with no nystagmus and no c/o vertigo to indicate resolution of Rt BPPV.   12-22-16   Time 4   Period Weeks   Status New               Plan - 11/21/16 2025    Clinical Impression Statement Pt has signs and symptoms consistent with Rt BPPV posterior canalithiasis; symptoms reported to be much improved after Epley maneuver performed 3 reps for Rt BPPV   Rehab Potential Good   PT Frequency 1x / week   PT Duration 4 weeks   PT Treatment/Interventions ADLs/Self Care Home Management;Gait training;Stair  training;Therapeutic exercise;Balance training;Neuromuscular re-education;Patient/family education;Vestibular;Therapeutic activities;Canalith Repostioning   PT Next Visit Plan reassess Rt BPPV   Consulted and Agree with Plan of Care Patient;Family member/caregiver   Family Member Consulted spouse [name]      Patient will benefit from skilled therapeutic intervention in order to improve the following deficits and impairments:  Dizziness, Difficulty walking, Decreased strength, Decreased balance  Visit Diagnosis: BPPV (benign paroxysmal positional vertigo), right - Plan: PT plan of care cert/re-cert  Other abnormalities of gait and mobility - Plan: PT plan of care cert/re-cert  Dizziness and giddiness - Plan: PT plan of care cert/re-cert  Unsteadiness on feet - Plan: PT plan of care cert/re-cert     Problem List Patient Active Problem List   Diagnosis Date Noted  . Edema of extremities 03/02/2016  . Morbid obesity ([place]) 03/01/2016  . OSA (obstructive sleep apnea) 07/11/2015  . Pulmonary hypertension ([place]) 05/06/2015  . Heart murmur 03/03/2015  . Essential hypertension, benign 03/18/2014  . Hyperlipidemia 03/18/2014  . Post-menopause on HRT (hormone replacement therapy) 03/18/2014  . Diabetes ([place]) 01/01/2013    Kayla Brown, [place], PT 11/21/2016, 8:37 PM  [facility] [address] [place], [place], [address] Phone: [phone]   Fax:  [phone]  Name: Kayla Brown MRN: [id number] Date of Birth: 03/31/1951

## 2016-11-21 NOTE — Telephone Encounter (Signed)
Removed tylenol as an allergy, will discuss with pt the others at upcoming appt.

## 2016-11-21 NOTE — Telephone Encounter (Signed)
Ok to remove these allergies?

## 2016-11-30 MED FILL — GAVILYTE-N SOLUTION: 420 | 1 days supply | Qty: 4000 | Fill #0

## 2016-12-03 ENCOUNTER — Ambulatory Visit: Payer: 59 | Attending: Family Medicine | Admitting: Physical Therapy

## 2016-12-03 DIAGNOSIS — R2681 Unsteadiness on feet: Secondary | ICD-10-CM | POA: Diagnosis not present

## 2016-12-03 DIAGNOSIS — R2689 Other abnormalities of gait and mobility: Secondary | ICD-10-CM | POA: Insufficient documentation

## 2016-12-04 NOTE — Therapy (Signed)
[facility] [address] [place], [place], [address] Phone: [phone]   Fax:  [phone]  Physical Therapy Treatment  Patient Details  Name: Kayla Brown MRN: [id number] Date of Birth: Dec 13, 1950 Referring Provider: Dr. Annye Asa  Encounter Date: 12/03/2016      PT End of Session - 12/04/16 2140    Visit Number 6   Number of Visits 9   Authorization Type UMR   PT Start Time 4332   PT Stop Time 1534   PT Time Calculation (min) 46 min   Equipment Utilized During Treatment Gait belt      Past Medical History:  Diagnosis Date  . Anxiety   . Diabetes mellitus   . Edema of extremities 03/02/2016  . Essential hypertension, benign   . Lazy eye   . Major depressive disorder, single episode, unspecified   . Obesity (BMI 30-39.9) 03/01/2016  . OSA (obstructive sleep apnea)   . Pulmonary HTN ([place])    mild with PASP 6mHg by echo 05/2016  . Pure hypercholesterolemia   . Retinopathy, due to hypertension     Past Surgical History:  Procedure Laterality Date  . CPAP TITRATION  10/14/2015  . DILATION AND CURETTAGE OF UTERUS  2005-2008   x2  . EYE SURGERY     cataracts  . WISDOM TOOTH EXTRACTION      There were no vitals filed for this visit.      Subjective Assessment - 12/04/16 2136    Subjective Pt reports the vertigo she had at last visit resolved with the treatment (Epley maneuver); wants to work on her balance today   Patient is accompained by: Family member   Pertinent History Pulmonary HTN, retinopathy due to HTN, DM, OSA, h/o sciatica   Patient Stated Goals Improve balance and mobility; make sure vertigo is resolved; climb steps easier   Currently in Pain? No/denies            TherEx:   Heel raises x 10 reps bil. LE's Unilateral -RLE and LLE 10 reps each Step ups 10 reps each RLE and LLE                  Balance Exercises - 12/04/16 2138      Balance Exercises:  Standing   Standing Eyes Opened Wide (BOA);Head turns;2 reps;10 secs   Standing Eyes Closed Wide (BOA);Solid surface;2 reps   SLS Eyes open;3 reps;10 secs   Stepping Strategy Anterior;Posterior;Lateral   Rockerboard Anterior/posterior;Lateral;EO;30 seconds;Intermittent UE support   Sidestepping 1 rep     Cone taps standing on blue mat surface for compliant surface training Crossovers front and back - standing on floor with CGA 10 reps each Standing on Bosu - inside // bars - with bil. UE support - moving each leg up/back and laterally x 5 reps each for Improved SLS           PT Long Term Goals - 11/21/16 2028      PT LONG TERM GOAL #1   Title Assess TUG and establish goal as appropriate.  11-21-16; goal </= 13.5 secs   11-21-16  CONT. LTG's til 12-22-16   Baseline 15.56 secs   Status On-going     PT LONG TERM GOAL #2   Title Increase gait velocity by at least .8 ft/sec for incr. gait efficiency.   5-30- incr. to >/= 2.5 ft/sec   Baseline Gait velocity 16.40= 2.0 ft/sec   Status On-going     PT LONG  TERM GOAL #3   Title Pt will negotiate steps (4) using a step by step sequence with 1 hand rail.  11-21-16   Status On-going     PT LONG TERM GOAL #4   Title Pt will perform bed mobility with no c/o vertigo.  11-21-16/  CONTINUE LTG --- 12-22-16 TARGET DATE   Status Not Met     PT LONG TERM GOAL #5   Title Independent in HEP.  11-21-16   Baseline met 11-20-16   Status Achieved     Additional Long Term Goals   Additional Long Term Goals Yes     PT LONG TERM GOAL #6   Title Pt will have a (-) Rt Dix-Hallpike test with no nystagmus and no c/o vertigo to indicate resolution of Rt BPPV.   12-22-16   Time 4   Period Weeks   Status New               Plan - 12/04/16 2141    Clinical Impression Statement Pt is progressing towards LTG's; cont to exhibit decr. high level balance skills but overall mobility is improving; pt compliant with HEP and also reports participating in  aquatic exercise    Rehab Potential Good   PT Frequency 1x / week   PT Duration 4 weeks   PT Treatment/Interventions ADLs/Self Care Home Management;Gait training;Stair training;Therapeutic exercise;Balance training;Neuromuscular re-education;Patient/family education;Vestibular;Therapeutic activities;Canalith Repostioning   PT Next Visit Plan cont balance exercises   Consulted and Agree with Plan of Care Patient;Family member/caregiver   Family Member Consulted spouse [name]      Patient will benefit from skilled therapeutic intervention in order to improve the following deficits and impairments:  Dizziness, Difficulty walking, Decreased strength, Decreased balance  Visit Diagnosis: Other abnormalities of gait and mobility  Unsteadiness on feet     Problem List Patient Active Problem List   Diagnosis Date Noted  . Edema of extremities 03/02/2016  . Morbid obesity ([place]) 03/01/2016  . OSA (obstructive sleep apnea) 07/11/2015  . Pulmonary hypertension ([place]) 05/06/2015  . Heart murmur 03/03/2015  . Essential hypertension, benign 03/18/2014  . Hyperlipidemia 03/18/2014  . Post-menopause on HRT (hormone replacement therapy) 03/18/2014  . Diabetes ([place]) 01/01/2013    Yanina Knupp, [place], PT 12/04/2016, 9:46 PM  [facility] [address] [place], [place], [address] Phone: [phone]   Fax:  [phone]  Name: Kayla Brown MRN: [id number] Date of Birth: [birth year]

## 2016-12-05 ENCOUNTER — Ambulatory Visit (INDEPENDENT_AMBULATORY_CARE_PROVIDER_SITE_OTHER): Payer: 59 | Admitting: Family Medicine

## 2016-12-05 ENCOUNTER — Encounter: Payer: Self-pay | Admitting: Family Medicine

## 2016-12-05 VITALS — BP 124/78 | HR 70 | Temp 98.1°F | Resp 16 | Ht 61.0 in | Wt 211.4 lb

## 2016-12-05 DIAGNOSIS — F329 Major depressive disorder, single episode, unspecified: Secondary | ICD-10-CM

## 2016-12-05 DIAGNOSIS — E119 Type 2 diabetes mellitus without complications: Secondary | ICD-10-CM

## 2016-12-05 DIAGNOSIS — F419 Anxiety disorder, unspecified: Secondary | ICD-10-CM | POA: Diagnosis not present

## 2016-12-05 NOTE — Assessment & Plan Note (Signed)
Much improved.  Pt feels that her relationship w/ husband and god have improved greatly and she's in a wonderful place.  Wanting to stop Effexor.  Will decrease to every other day and then stop entirely.  Pt expressed understanding and is in agreement w/ plan.

## 2016-12-05 NOTE — Progress Notes (Signed)
Pre visit review using our clinic review tool, if applicable. No additional management support is needed unless otherwise documented below in the visit note. 

## 2016-12-05 NOTE — Assessment & Plan Note (Signed)
Chronic problem.  Reviewed need for low carb diet.  Applauded her recent decision to exercise- she is down 7 lbs.  Discussed medication and reviewed sugar logs.  The only elevated readings are in response to dietary indiscretions.  No need to change meds at this time but will follow closely.  Pt expressed understanding and is in agreement w/ plan.

## 2016-12-05 NOTE — Patient Instructions (Signed)
Follow up as scheduled to recheck diabetes Decrease the Effexor to every other day and then stop when you finish this bottle Continue to work on healthy diet and regular exercise- you're doing great! Overall, your sugars look good and the high readings are in response to what you are eating.  No need to change meds at this time Call with any questions or concerns Have a great summer!

## 2016-12-05 NOTE — Progress Notes (Signed)
   Subjective:    Patient ID: Kayla Brown, female    DOB: 10/07/1950, 66 y.o.   MRN: [id number]  HPI DM- chronic problem, on Metformin, Glipizide, Actos w/ hx of good control.  Home CBGs are running 62-288.  Only 2 outliers- 274, 288.  Pt is down 7 lbs since last visit- swimming, changing diet.  Pt has been concerned about home CBGs- fastings have been above 130 periodically.  Pt's 288 reading yesterday followed a reading of 62 at lunch and pt 'overcompensated'.  Denies   Anxiety/depression- pt is down to 37.5mg  of Effexor and feels like she's in a good place and would like to come off meds.  Review of Systems For ROS see HPI     Objective:   Physical Exam  Constitutional: She is oriented to person, place, and time. She appears well-developed and well-nourished. No distress.  HENT:  Head: Normocephalic and atraumatic.  Neurological: She is alert and oriented to person, place, and time.  Skin: Skin is warm and dry.  Psychiatric: She has a normal mood and affect. Her behavior is normal. Judgment and thought content normal.  Vitals reviewed.         Assessment & Plan:

## 2016-12-14 DIAGNOSIS — K640 First degree hemorrhoids: Secondary | ICD-10-CM | POA: Diagnosis not present

## 2016-12-14 DIAGNOSIS — Z1211 Encounter for screening for malignant neoplasm of colon: Secondary | ICD-10-CM | POA: Diagnosis not present

## 2016-12-14 LAB — HM COLONOSCOPY

## 2016-12-17 MED FILL — AMLODIPINE BESYLATE 10 MG T: 10 | 90 days supply | Qty: 90 | Fill #1

## 2016-12-20 MED FILL — METFORMIN HCL ER 500 MG TAB: 500 | 45 days supply | Qty: 180 | Fill #1

## 2016-12-27 ENCOUNTER — Encounter: Payer: Self-pay | Admitting: General Practice

## 2017-01-01 ENCOUNTER — Ambulatory Visit: Payer: 59 | Attending: Family Medicine | Admitting: Physical Therapy

## 2017-01-01 DIAGNOSIS — R2681 Unsteadiness on feet: Secondary | ICD-10-CM | POA: Diagnosis not present

## 2017-01-01 DIAGNOSIS — R2689 Other abnormalities of gait and mobility: Secondary | ICD-10-CM | POA: Insufficient documentation

## 2017-01-01 NOTE — Patient Instructions (Signed)
Heel Cord Stretch    Place one leg forward, bent, other leg behind and straight. Lean forward keeping back heel flat. Hold __30__ seconds while counting out loud. Repeat with other leg. Repeat _1-2___ times. Do __2__ sessions per day.  http://gt2.exer.us/512   Copyright  VHI. All rights reserved.  Gastroc / Heel Cord Stretch - On Step    Stand with heels over edge of stair. Holding rail, lower heels until stretch is felt in calf of legs. Repeat _1__ times. Do _1-2__ times per day.  HOLD 30 secs  Copyright  VHI. All rights reserved.  Bridge    Lie back, legs bent. Inhale, pressing hips up. Keeping ribs in, lengthen lower back. Exhale, rolling down along spine from top. Repeat _10___ times. Do __1__ sessions per day.  http://pm.exer.us/55   Copyright  VHI. All rights reserved.

## 2017-01-02 NOTE — Therapy (Signed)
[facility] [address] [place], [place], [address] Phone: [phone]   Fax:  [phone]  Physical Therapy Treatment  Patient Details  Name: Kayla Brown MRN: [id number] Date of Birth: October 04, 1950 Referring Provider: Dr. Annye Asa  Encounter Date: 01/01/2017      PT End of Session - 01/02/17 2143    Visit Number 7   Number of Visits 9   Authorization Type UMR   PT Start Time 3154   PT Stop Time 1609   PT Time Calculation (min) 55 min      Past Medical History:  Diagnosis Date  . Anxiety   . Diabetes mellitus   . Edema of extremities 03/02/2016  . Essential hypertension, benign   . Lazy eye   . Major depressive disorder, single episode, unspecified   . Obesity (BMI 30-39.9) 03/01/2016  . OSA (obstructive sleep apnea)   . Pulmonary HTN ([place])    mild with PASP 52mHg by echo 05/2016  . Pure hypercholesterolemia   . Retinopathy, due to hypertension     Past Surgical History:  Procedure Laterality Date  . CPAP TITRATION  10/14/2015  . DILATION AND CURETTAGE OF UTERUS  2005-2008   x2  . EYE SURGERY     cataracts  . WISDOM TOOTH EXTRACTION      There were no vitals filed for this visit.      Subjective Assessment - 01/02/17 2131    Subjective Pt states she had another episode of vertigo last Wed. but states it is much better now - says she did the exercises and it helped   Patient is accompained by: Family member   Pertinent History Pulmonary HTN, retinopathy due to HTN, DM, OSA, h/o sciatica   Patient Stated Goals Improve balance and mobility; make sure vertigo is resolved; climb steps easier   Currently in Pain? No/denies                         [facility] - 01[facility]17 0001      Ambulation/Gait   Ambulation/Gait Yes   Ambulation/Gait Assistance 6: Modified independent (Device/Increase time)   Gait Pattern Step-through pattern   Ambulation Surface  Level;Indoor   Gait velocity 2.5   Stairs Yes   Stair Management Technique One rail Right;Forwards;Alternating pattern   Number of Stairs 4   Height of Stairs 6     Scifit level 2.0 x 7"   Added bridging and hamstring and heel cord stretching to HEP for bil. LE's      Balance Exercises - 01/02/17 2143      Balance Exercises: Standing   Standing Eyes Opened Wide (BOA);Head turns;2 reps;10 secs   Standing Eyes Closed Wide (BOA);Solid surface;2 reps   SLS Eyes open;3 reps;10 secs   Rockerboard Anterior/posterior;Lateral;EO;30 seconds;Intermittent UE support   Tandem Gait Forward;2 reps   Sidestepping 1 rep   Other Standing Exercises Pt performed standing forward, back  and side kicks; marhcing in place     reviewed Brandt-daroff exercises for self treatment of BPPV as needed           PT Long Term Goals - 01/02/17 2135      PT LONG TERM GOAL #1   Title Assess TUG and establish goal as appropriate.  11-21-16; goal </= 13.5 secs   11-21-16   Baseline 15.56 secs;  10.6 secs on 01-01-17   Time 4   Period Weeks   Status Achieved  PT LONG TERM GOAL #2   Title Increase gait velocity by at least .8 ft/sec for incr. gait efficiency.   5-30- incr. to >/= 2.5 ft/sec   Baseline Gait velocity 16.40= 2.0 ft/sec;    12.8 secs on 01-01-17 = 2.56 ft/sec   Status Achieved     PT LONG TERM GOAL #3   Title Pt will negotiate steps (4) using a step by step sequence with 1 hand rail.  11-21-16   Baseline met 01-01-17   Status Achieved     PT LONG TERM GOAL #4   Title Pt will perform bed mobility with no c/o vertigo.  11-21-16/  CONTINUE LTG --- 12-22-16 TARGET DATE   Baseline episode occurred on 12-26-16 resulting in some occasional vertigo - 01-01-17   Status Partially Met     PT LONG TERM GOAL #5   Title Independent in HEP.  11-21-16   Status Achieved     PT LONG TERM GOAL #6   Title Pt will have a (-) Rt Dix-Hallpike test with no nystagmus and no c/o vertigo to indicate resolution of  Rt BPPV.   12-22-16   Status Achieved               Plan - 01/02/17 2144    Clinical Impression Statement Pt has met all LTG's - is ready for disharge at this time - demonstrates improvements in gait, balance and vertigo at this time; pt is currently independent with her HEP including walking and swimming and HEP for balance exercises   Rehab Potential Good   PT Frequency 1x / week   PT Duration 4 weeks   PT Treatment/Interventions ADLs/Self Care Home Management;Gait training;Stair training;Therapeutic exercise;Balance training;Neuromuscular re-education;Patient/family education;Vestibular;Therapeutic activities;Canalith Repostioning   PT Next Visit Plan cont balance exercises   Consulted and Agree with Plan of Care Patient;Family member/caregiver   Family Member Consulted spouse [name]      Patient will benefit from skilled therapeutic intervention in order to improve the following deficits and impairments:  Dizziness, Difficulty walking, Decreased strength, Decreased balance  Visit Diagnosis: Unsteadiness on feet  Other abnormalities of gait and mobility     Problem List Patient Active Problem List   Diagnosis Date Noted  . Anxiety and depression 12/05/2016  . Edema of extremities 03/02/2016  . Morbid obesity ([place]) 03/01/2016  . OSA (obstructive sleep apnea) 07/11/2015  . Pulmonary hypertension ([place]) 05/06/2015  . Heart murmur 03/03/2015  . Essential hypertension, benign 03/18/2014  . Hyperlipidemia 03/18/2014  . Post-menopause on HRT (hormone replacement therapy) 03/18/2014  . Diabetes ([place]) 01/01/2013    PHYSICAL THERAPY DISCHARGE SUMMARY  Visits from Start of Care: 7  Current functional level related to goals / functional outcomes: See above for progress towards LTG's - all goals met at this time - pt has made significant progress with improving mobility    Remaining deficits:  Pt continues to experience recurrent episodes of BPPV, but she is able to manage  these episodes with Brandt-Daroff exercises Continued decr. High level balance skills    Education / Equipment: Pt has been instructed in HEP for balance/vestibular exercises and reports compliance with this HEP. Plan: Patient agrees to discharge.  Patient goals were met. Patient is being discharged due to meeting the stated rehab goals.  ?????  Recommend follow up in approx. 6 months for re-evaluation to ensure pt is able to maintain current functional status achieved to date.  Pt has been informed that a new referral from MD will be needed.  Alda Lea, PT 01/02/2017, 9:48 PM  [facility] [address] [place], [place], [address] Phone: [phone]   Fax:  [phone]  Name: Kayla Brown MRN: [id number] Date of Birth: 12/25/1950

## 2017-01-14 MED FILL — SPIRONOLACTONE 25 MG TABLET: 25 | 60 days supply | Qty: 30 | Fill #2

## 2017-01-16 MED FILL — ACETAMINOPHEN/COD #3 TABLET: 300-30 | 3 days supply | Qty: 10 | Fill #0

## 2017-01-16 MED FILL — AMOXICILLIN 500 MG CAPSULE: 500 | 7 days supply | Qty: 21 | Fill #0

## 2017-01-16 MED FILL — IBUPROFEN 600 MG TABLET: 600 | 4 days supply | Qty: 16 | Fill #0

## 2017-01-18 MED FILL — PROGESTERONE 100 MG CAPSULE: 100 | 90 days supply | Qty: 90 | Fill #1 | Status: TO

## 2017-01-22 MED FILL — IBUPROFEN 600 MG TABLET: 600 | 4 days supply | Qty: 16 | Fill #1 | Status: TO

## 2017-01-30 ENCOUNTER — Encounter: Payer: 59 | Admitting: Family Medicine

## 2017-02-06 ENCOUNTER — Ambulatory Visit (INDEPENDENT_AMBULATORY_CARE_PROVIDER_SITE_OTHER): Payer: 59 | Admitting: Family Medicine

## 2017-02-06 ENCOUNTER — Encounter: Payer: Self-pay | Admitting: Family Medicine

## 2017-02-06 VITALS — BP 126/76 | HR 73 | Temp 98.1°F | Resp 16 | Ht 61.0 in | Wt 216.0 lb

## 2017-02-06 DIAGNOSIS — Z0000 Encounter for general adult medical examination without abnormal findings: Secondary | ICD-10-CM | POA: Diagnosis not present

## 2017-02-06 DIAGNOSIS — E119 Type 2 diabetes mellitus without complications: Secondary | ICD-10-CM | POA: Diagnosis not present

## 2017-02-06 LAB — BASIC METABOLIC PANEL
BUN: 32 mg/dL — ABNORMAL HIGH (ref 6–23)
CO2: 25 mEq/L (ref 19–32)
Calcium: 9.8 mg/dL (ref 8.4–10.5)
Chloride: 104 mEq/L (ref 96–112)
Creatinine, Ser: 0.91 mg/dL (ref 0.40–1.20)
GFR: 65.97 mL/min (ref >60.00)
Glucose, Bld: 169 mg/dL — ABNORMAL HIGH (ref 70–99)
Potassium: 3.9 mEq/L (ref 3.5–5.1)
Sodium: 139 mEq/L (ref 135–145)

## 2017-02-06 LAB — CBC WITH DIFFERENTIAL/PLATELET
Basophils Absolute: 0.1 10*3/uL (ref 0.0–0.1)
Basophils Relative: 0.8 % (ref 0.0–3.0)
Eosinophils Absolute: 0.6 10*3/uL (ref 0.0–0.7)
Eosinophils Relative: 5.9 % — ABNORMAL HIGH (ref 0.0–5.0)
HCT: 36.7 % (ref 36.0–46.0)
Hemoglobin: 11.9 g/dL — ABNORMAL LOW (ref 12.0–15.0)
Lymphocytes Relative: 22.2 % (ref 12.0–46.0)
Lymphs Abs: 2.4 10*3/uL (ref 0.7–4.0)
MCHC: 32.4 g/dL (ref 30.0–36.0)
MCV: 89.7 fl (ref 78.0–100.0)
Monocytes Absolute: 1.2 10*3/uL — ABNORMAL HIGH (ref 0.1–1.0)
Monocytes Relative: 10.8 % (ref 3.0–12.0)
Neutro Abs: 6.5 10*3/uL (ref 1.4–7.7)
Neutrophils Relative %: 60.3 % (ref 43.0–77.0)
Platelets: 314 10*3/uL (ref 150.0–400.0)
RBC: 4.09 Mil/uL (ref 3.87–5.11)
RDW: 15.6 % — ABNORMAL HIGH (ref 11.5–15.5)
WBC: 10.9 10*3/uL — ABNORMAL HIGH (ref 4.0–10.5)

## 2017-02-06 LAB — LIPID PANEL
Cholesterol: 128 mg/dL (ref 0–200)
HDL: 51.7 mg/dL (ref >39.00)
LDL Cholesterol: 51 mg/dL (ref 0–99)
NonHDL: 76.38
Total CHOL/HDL Ratio: 2
Triglycerides: 125 mg/dL (ref 0.0–149.0)
VLDL: 25 mg/dL (ref 0.0–40.0)

## 2017-02-06 LAB — HEMOGLOBIN A1C: Hgb A1c MFr Bld: 8 % — ABNORMAL HIGH (ref 4.6–6.5)

## 2017-02-06 LAB — HEPATIC FUNCTION PANEL
ALT: 22 U/L (ref 0–35)
AST: 14 U/L (ref 0–37)
Albumin: 4.5 g/dL (ref 3.5–5.2)
Alkaline Phosphatase: 60 U/L (ref 39–117)
Bilirubin, Direct: 0.1 mg/dL (ref 0.0–0.3)
Total Bilirubin: 0.4 mg/dL (ref 0.2–1.2)
Total Protein: 6.4 g/dL (ref 6.0–8.3)

## 2017-02-06 LAB — TSH: TSH: 1.67 u[IU]/mL (ref 0.35–4.50)

## 2017-02-06 MED ORDER — GLIPIZIDE ER 5 MG PO TB24: 5.0000 mg | ORAL_TABLET | Freq: Every day | ORAL | 1 refills | Status: DC

## 2017-02-06 MED ORDER — TRUEPLUS LANCETS 30G MISC: 3 refills | Status: DC

## 2017-02-06 MED ORDER — AMLODIPINE BESYLATE 10 MG PO TABS: 10.0000 mg | ORAL_TABLET | Freq: Every day | ORAL | 1 refills | Status: DC

## 2017-02-06 MED ORDER — HYDROCHLOROTHIAZIDE 12.5 MG PO CAPS: 12.5000 mg | ORAL_CAPSULE | Freq: Two times a day (BID) | ORAL | 1 refills | Status: DC

## 2017-02-06 MED ORDER — METFORMIN HCL ER 500 MG PO TB24: 1000.0000 mg | ORAL_TABLET | Freq: Two times a day (BID) | ORAL | 1 refills | Status: DC

## 2017-02-06 MED ORDER — ATORVASTATIN CALCIUM 40 MG PO TABS: 40.0000 mg | ORAL_TABLET | Freq: Every day | ORAL | 1 refills | Status: DC

## 2017-02-06 MED ORDER — TRUE METRIX BLOOD GLUCOSE TEST VI STRP: ORAL_STRIP | 3 refills | Status: DC

## 2017-02-06 MED ORDER — ALCOHOL PREP PADS: MEDICATED_PAD | 3 refills | Status: AC

## 2017-02-06 MED ORDER — RAMIPRIL 10 MG PO CAPS
20.0000 mg | ORAL_CAPSULE | Freq: Every day | ORAL | 1 refills | Status: DC
Start: 2017-02-06 — End: 2017-05-27

## 2017-02-06 MED FILL — METFORMIN HCL ER 500 MG TAB: 500 | 90 days supply | Qty: 360 | Fill #0 | Status: TO

## 2017-02-06 MED FILL — HYDROCHLOROTHIAZIDE 12.5 MG: 12.5 | 90 days supply | Qty: 180 | Fill #0 | Status: TO

## 2017-02-06 MED FILL — RAMIPRIL 10 MG CAPSULE: 10 | 45 days supply | Qty: 90 | Fill #0 | Status: TO

## 2017-02-06 MED FILL — SM ALCOHOL 70% PREP PADS: 70 | 50 days supply | Qty: 100 | Fill #0 | Status: TO

## 2017-02-06 MED FILL — FREESTYLE LITE METER: 30 days supply | Qty: 1 | Fill #0

## 2017-02-06 MED FILL — FREESTYLE LITE TEST STRIP: 50 days supply | Qty: 100 | Fill #0

## 2017-02-06 MED FILL — FREESTYLE LANCETS: 50 days supply | Qty: 100 | Fill #0

## 2017-02-06 MED FILL — ATORVASTATIN 40 MG TABLET: 40 | 90 days supply | Qty: 90 | Fill #0 | Status: TO

## 2017-02-06 NOTE — Patient Instructions (Signed)
Follow up in 3-4 months to recheck diabetes We'll notify you of your lab results and make any changes if needed Continue to work on healthy diet and regular exercise- you can do it! Call with any questions or concerns Enjoy the rest of your summer! 

## 2017-02-06 NOTE — Progress Notes (Signed)
Pre visit review using our clinic review tool, if applicable. No additional management support is needed unless otherwise documented below in the visit note. 

## 2017-02-06 NOTE — Assessment & Plan Note (Signed)
Pt's PE unchanged from previous and WNL w/ exception of obesity and divergent strabmismus (exotropia).  UTD on eye exam, foot exam, pap, mammo, colonoscopy.  UTD on immunizations.  Check labs.  Anticipatory guidance provided.

## 2017-02-06 NOTE — Assessment & Plan Note (Signed)
Chronic problem.  Pt admits to not having a good month- I was eating more than normal, not exercising.  Pt reports CBGs were elevated and around 200s.  Check labs.  Adjust meds prn

## 2017-02-06 NOTE — Progress Notes (Signed)
   Subjective:    Patient ID: Kayla Brown, female    DOB: 09/13/1950, 66 y.o.   MRN: [id number]  HPI CPE- UTD on colonoscopy, pap, mammo, eye exam, foot exam.   Review of Systems Patient reports no vision/ hearing changes, adenopathy,fever, weight change,  persistant/recurrent hoarseness , swallowing issues, chest pain, palpitations, edema, persistant/recurrent cough, hemoptysis, dyspnea (rest/exertional/paroxysmal nocturnal), gastrointestinal bleeding (melena, rectal bleeding), abdominal pain, significant heartburn, bowel changes, GU symptoms (dysuria, hematuria, incontinence), Gyn symptoms (abnormal  bleeding, pain),  syncope, focal weakness, memory loss, numbness & tingling, skin/hair/nail changes, abnormal bruising or bleeding, anxiety, or depression.     Objective:   Physical Exam General Appearance:    Alert, cooperative, no distress, appears stated age, obese  Head:    Normocephalic, without obvious abnormality, atraumatic  Eyes:    PERRL, conjunctiva/corneas clear, EOM's intact, fundi    benign, both eyes  Ears:    Normal TM's and external ear canals, both ears  Nose:   Nares normal, septum midline, mucosa normal, no drainage    or sinus tenderness  Throat:   Lips, mucosa, and tongue normal; teeth and gums normal  Neck:   Supple, symmetrical, trachea midline, no adenopathy;    Thyroid: no enlargement/tenderness/nodules  Back:     Symmetric, no curvature, ROM normal, no CVA tenderness  Lungs:     Clear to auscultation bilaterally, respirations unlabored  Chest Wall:    No tenderness or deformity   Heart:    Regular rate and rhythm, S1 and S2 normal, no murmur, rub   or gallop  Breast Exam:    Deferred to GYN  Abdomen:     Soft, non-tender, bowel sounds active all four quadrants,    no masses, no organomegaly  Genitalia:    Deferred to GYN  Rectal:    Extremities:   Extremities normal, atraumatic, no cyanosis or edema  Pulses:   2+ and symmetric all extremities  Skin:   Skin  color, texture, turgor normal, no rashes or lesions  Lymph nodes:   Cervical, supraclavicular, and axillary nodes normal  Neurologic:   CNII-XII intact, normal strength, sensation and reflexes    throughout          Assessment & Plan:

## 2017-02-07 ENCOUNTER — Other Ambulatory Visit: Payer: Self-pay | Admitting: General Practice

## 2017-02-07 MED ORDER — GLIPIZIDE ER 10 MG PO TB24: 10.0000 mg | ORAL_TABLET | Freq: Every day | ORAL | 1 refills | Status: DC

## 2017-02-07 MED FILL — glipiZIDE ER 10 MG TB24: 10 | 90 days supply | Qty: 90 | Fill #0 | Status: TO

## 2017-03-04 ENCOUNTER — Ambulatory Visit (INDEPENDENT_AMBULATORY_CARE_PROVIDER_SITE_OTHER): Payer: 59

## 2017-03-04 DIAGNOSIS — Z23 Encounter for immunization: Secondary | ICD-10-CM | POA: Diagnosis not present

## 2017-03-12 MED FILL — AMLODIPINE BESYLATE 10 MG T: 10 | 90 days supply | Qty: 90 | Fill #2 | Status: TO

## 2017-03-12 MED FILL — ESTRADIOL 0.5 MG TABLET: 0.5 | 90 days supply | Qty: 45 | Fill #1 | Status: TO

## 2017-03-12 MED FILL — SPIRONOLACTONE 25 MG TABLET: 25 | 60 days supply | Qty: 30 | Fill #3 | Status: TO

## 2017-04-05 ENCOUNTER — Telehealth: Payer: Self-pay

## 2017-04-05 MED FILL — FREESTYLE LANCETS: 50 days supply | Qty: 100 | Fill #1 | Status: TO

## 2017-04-05 MED FILL — FREESTYLE LITE TEST STRIP: 50 days supply | Qty: 100 | Fill #1 | Status: TO

## 2017-04-05 NOTE — Patient Outreach (Signed)
Called [employer] member to schedule a follow up appointment, there was no answer, left message to call me back. 

## 2017-04-08 ENCOUNTER — Other Ambulatory Visit: Payer: Self-pay | Admitting: Pharmacist

## 2017-04-08 NOTE — Patient Outreach (Signed)
[facility]) Care Management  [facility]   04/08/2017  Kayla Brown August 28, 1950 [id number]  Subjective: Patient presents today for 6 month diabetes follow-up as part of the employer-sponsored [employer] program. She is changing insurance to Medicare and will be leaving [employer] after this visit. Current diabetes regimen includes metformin, glipizide, pioglitazone.  Patient also continues on daily aspirin, amlodipine, HCTZ, ramipril, spironolactone and atorvastatin.  Patient endorses good compliance to medications. Most recent MD follow-up was 02/06/17.  No med changes or major health changes at this time.    Objective:  Lab Results  Component Value Date   HGBA1C 8.0 (H) 02/06/2017   Vitals:   04/08/17 1310  BP: (!) 179/83  Pulse: 94    Lipid Panel     Component Value Date/Time   CHOL 128 02/06/2017 1513   TRIG 125.0 02/06/2017 1513   HDL 51.70 02/06/2017 1513   CHOLHDL 2 02/06/2017 1513   VLDL 25.0 02/06/2017 1513   LDLCALC 51 02/06/2017 1513      Encounter Medications: Outpatient Encounter Prescriptions as of 04/08/2017  Medication Sig  . Alcohol Swabs (ALCOHOL PREP) PADS Pt uses an alcohol pad each time sugars are tested. Pt tests twice daily. Dx E11.9  . amLODipine (NORVASC) 10 MG tablet Take 1 tablet (10 mg total) by mouth daily.  [name] atorvastatin (LIPITOR) 40 MG tablet Take 1 tablet (40 mg total) by mouth daily.  . cetirizine (ZYRTEC) 10 MG tablet Take 10 mg by mouth daily as needed.   . Cholecalciferol (VITAMIN D PO) Take 2,000 Int'l Units by mouth daily.   . cyclobenzaprine (FLEXERIL) 10 MG tablet Take 1 tablet (10 mg total) by mouth 3 (three) times daily as needed for muscle spasms.  . fish oil-omega-3 fatty acids 1000 MG capsule Take 1 g by mouth daily.  [name] glipiZIDE (GLIPIZIDE XL) 10 MG 24 hr tablet Take 1 tablet (10 mg total) by mouth daily with breakfast.  . hydrochlorothiazide (MICROZIDE) 12.5 MG capsule Take 1 capsule  (12.5 mg total) by mouth 2 (two) times daily.  . meclizine (ANTIVERT) 25 MG tablet Take 1 tablet (25 mg total) by mouth 3 (three) times daily as needed for dizziness.  [name] MELATONIN PO Take 3 mg by mouth at bedtime.   . metFORMIN (GLUCOPHAGE-XR) 500 MG 24 hr tablet Take 2 tablets (1,000 mg total) by mouth 2 (two) times daily before a meal.  . Multiple Vitamins-Minerals (VISION FORMULA) TABS Take 1 tablet by mouth every morning.   [name] OVER THE COUNTER MEDICATION Equate Vision Formula 50+  . pioglitazone (ACTOS) 30 MG tablet Take 1 tablet (30 mg total) by mouth daily.  . ramipril (ALTACE) 10 MG capsule Take 2 capsules (20 mg total) by mouth daily.  [name] spironolactone (ALDACTONE) 25 MG tablet Take 0.5 tablets (12.5 mg total) by mouth daily.  . TRUE METRIX BLOOD GLUCOSE TEST test strip Use on strip to test sugars 1-2 times daily. Dx. E11.9  . TRUEPLUS LANCETS 30G MISC Use one lancet each time sugars are tested. Pt tests twice daily. Dx. E11.9  . estradiol (ESTRACE) 0.5 MG tablet Take 0.5 tablets (0.25 mg total) by mouth daily. (Patient not taking: Reported on 04/08/2017)  . progesterone (PROMETRIUM) 100 MG capsule Take 1 capsule (100 mg total) by mouth daily. (Patient not taking: Reported on 04/08/2017)  . venlafaxine (EFFEXOR) 37.5 MG tablet TAKE 1 TABLET BY MOUTH DAILY. (Patient not taking: Reported on 04/08/2017)   No facility-administered encounter medications on file  as of 04/08/2017.     Functional Status: In your present state of health, do you have any difficulty performing the following activities: 07/06/2016  Hearing? N  Vision? Y  Difficulty concentrating or making decisions? N  Walking or climbing stairs? N  Dressing or bathing? N  Doing errands, shopping? N  Some recent data might be hidden    Fall/Depression Screening: Fall Risk  02/06/2017 10/08/2016 07/06/2016  Falls in the past year? No No No  Number falls in past yr: - - -  Comment - - -  Injury with Fall? - - -  Risk for fall  due to : - - -  Follow up - - -   PHQ 2/9 Scores 02/06/2017 10/18/2016 10/08/2016 10/02/2016 07/06/2016 01/17/2015  PHQ - 2 Score 0 0 0 0 0 0  PHQ- 9 Score 0 0 - 0 0 -      Assessment: Diabetes:   - A1C 8.0% which is above goal of less than 7%, limited by dietary indescretions and insufficient exercise.   Physical Activity  - improving - below goal of at least 150 minutes of moderate intensity exercise weekly, but improving overall  Nutrition-  Not at goal, limited by eating out >4 nights/week   Plan/Goals for Next Visit: -Counseled on Medicare prescription plans. [facility] for medications [facility] - Detailed goals and action plans are listed below     Al Corpus, PharmD PGY1 Pharmacy Resident Email: [email or web address]    [facility] CM Care Plan Problem One     Most Recent Value  Care Plan Problem One  A1C >6.5%  Role Documenting the Problem One  Clinical Pharmacist  Care Plan for Problem One  Not Active  [facility] Long Term Goal   Reduce A1C to less than 7% with a goal of 6.5%.   [facility] Long Term Goal Start Date  02/06/16  Interventions for Problem One Long Term Goal  changing insurance, now with Medicare and leaving [facility]

## 2017-04-19 ENCOUNTER — Telehealth: Payer: Self-pay | Admitting: Family Medicine

## 2017-04-19 MED ORDER — PIOGLITAZONE HCL 30 MG PO TABS: 30.0000 mg | ORAL_TABLET | Freq: Every day | ORAL | 1 refills | Status: DC

## 2017-04-19 NOTE — Telephone Encounter (Signed)
Pt states that she needs refill on piolitazone and is [facility] phone: [phone]

## 2017-04-19 NOTE — Telephone Encounter (Signed)
Medication has been sent to the requested pharmacy. 

## 2017-05-27 ENCOUNTER — Other Ambulatory Visit: Payer: Self-pay | Admitting: Family Medicine

## 2017-06-05 ENCOUNTER — Ambulatory Visit: Payer: 59 | Admitting: Family Medicine

## 2017-06-10 ENCOUNTER — Ambulatory Visit: Payer: 59 | Admitting: Family Medicine

## 2017-06-13 ENCOUNTER — Other Ambulatory Visit: Payer: Self-pay | Admitting: Cardiology

## 2017-07-01 ENCOUNTER — Other Ambulatory Visit: Payer: Self-pay

## 2017-07-01 ENCOUNTER — Encounter: Payer: Self-pay | Admitting: Family Medicine

## 2017-07-01 ENCOUNTER — Ambulatory Visit (INDEPENDENT_AMBULATORY_CARE_PROVIDER_SITE_OTHER): Payer: Medicare Other | Admitting: Family Medicine

## 2017-07-01 VITALS — BP 136/86 | HR 78 | Temp 98.1°F | Resp 16 | Ht 60.0 in | Wt 221.0 lb

## 2017-07-01 DIAGNOSIS — Z23 Encounter for immunization: Secondary | ICD-10-CM

## 2017-07-01 DIAGNOSIS — E119 Type 2 diabetes mellitus without complications: Secondary | ICD-10-CM

## 2017-07-01 DIAGNOSIS — Z0110 Encounter for examination of ears and hearing without abnormal findings: Secondary | ICD-10-CM | POA: Diagnosis not present

## 2017-07-01 DIAGNOSIS — R143 Flatulence: Secondary | ICD-10-CM

## 2017-07-01 DIAGNOSIS — I10 Essential (primary) hypertension: Secondary | ICD-10-CM | POA: Diagnosis not present

## 2017-07-01 LAB — BASIC METABOLIC PANEL
BUN: 29 mg/dL — ABNORMAL HIGH (ref 6–23)
CO2: 24 mEq/L (ref 19–32)
Calcium: 9.9 mg/dL (ref 8.4–10.5)
Chloride: 103 mEq/L (ref 96–112)
Creatinine, Ser: 1.14 mg/dL (ref 0.40–1.20)
GFR: 50.8 mL/min — ABNORMAL LOW (ref >60.00)
Glucose, Bld: 171 mg/dL — ABNORMAL HIGH (ref 70–99)
Potassium: 5.2 mEq/L — ABNORMAL HIGH (ref 3.5–5.1)
Sodium: 139 mEq/L (ref 135–145)

## 2017-07-01 LAB — HEMOGLOBIN A1C: Hgb A1c MFr Bld: 7.7 % — ABNORMAL HIGH (ref 4.6–6.5)

## 2017-07-01 NOTE — Assessment & Plan Note (Signed)
Reviewed use of Beano or Gas-X prn.  Pt expressed understanding and is in agreement w/ plan.

## 2017-07-01 NOTE — Assessment & Plan Note (Signed)
Chronic problem.  Pt's BP was initially mildly elevated but improved by the end of the visit.  Check labs.  No anticipated med changes.  Will follow closely.

## 2017-07-01 NOTE — Patient Instructions (Addendum)
Follow up in 3-4 months to recheck diabetes We'll notify you of your lab results and make any changes if needed Please have them send me the results of your eye exam No med changes at this time Add OTC Beano or Gas-X as needed Call with any questions or concerns Happy New Year!

## 2017-07-01 NOTE — Progress Notes (Signed)
   Subjective:    Patient ID: Kayla Brown, female    DOB: 09/09/1950, 67 y.o.   MRN: [id number]  HPI DM- chronic problem, on Actos 30mg  daily, Metformin 1000 BID, Glipizide 10mg  daily.  UTD on foot exam, on ACE for renal protection.  Due for eye exam- pt was supposed to have exam in December but had to wait until new year for insurance reasons (scheduled for this month).  Pt has gained 4 lbs since last visit.  Rare symptomatic lows.  Pt reports CBGs have improved since last visit.  Pt reports she is walking and doing the treadmill.  No CP, some SOB w/ exercise.  No numbness/tingling of hands/feet.  No abd pain, N/V.  HTN- chronic problem, on Amlodipine 10mg , HCTZ 12.5mg , Ramipril 10mg , Spironolactone 25mg  and BP remains mildly elevated.  Would now like me to manage her BP.  Gas- pt is taking OTC [employer] gas reliever but is still embarrassed by 'the poots'.  She reports that she will have gas upon standing.   Review of Systems For ROS see HPI     Objective:   Physical Exam  Constitutional: She is oriented to person, place, and time. She appears well-developed and well-nourished. No distress.  obese  HENT:  Head: Normocephalic and atraumatic.  Eyes: Conjunctivae and EOM are normal. Pupils are equal, round, and reactive to light.  Neck: Normal range of motion. Neck supple. No thyromegaly present.  Cardiovascular: Normal rate, regular rhythm, normal heart sounds and intact distal pulses.  No murmur heard. Pulmonary/Chest: Effort normal and breath sounds normal. No respiratory distress.  Abdominal: Soft. She exhibits no distension. There is no tenderness.  Musculoskeletal: She exhibits no edema.  Lymphadenopathy:    She has no cervical adenopathy.  Neurological: She is alert and oriented to person, place, and time.  Skin: Skin is warm and dry.  Psychiatric: She has a normal mood and affect. Her behavior is normal.  Vitals reviewed.         Assessment & Plan:

## 2017-07-01 NOTE — Assessment & Plan Note (Signed)
Chronic problem.  Pt reports she has been working on diet and exercise so she's hoping her A1C has improved.  On ACE for renal protection.  Due for eye exam- plans to schedule this month.  UTD on foot exam.  Check labs.  Adjust meds prn

## 2017-07-02 ENCOUNTER — Encounter: Payer: Self-pay | Admitting: General Practice

## 2017-07-07 ENCOUNTER — Other Ambulatory Visit: Payer: Self-pay | Admitting: Family Medicine

## 2017-07-08 ENCOUNTER — Other Ambulatory Visit: Payer: Self-pay | Admitting: General Practice

## 2017-07-08 MED ORDER — ATORVASTATIN CALCIUM 40 MG PO TABS: 40.0000 mg | ORAL_TABLET | Freq: Every day | ORAL | 1 refills | Status: DC

## 2017-07-09 ENCOUNTER — Other Ambulatory Visit: Payer: Self-pay | Admitting: General Practice

## 2017-07-09 DIAGNOSIS — H903 Sensorineural hearing loss, bilateral: Secondary | ICD-10-CM | POA: Diagnosis not present

## 2017-07-09 DIAGNOSIS — H9313 Tinnitus, bilateral: Secondary | ICD-10-CM | POA: Diagnosis not present

## 2017-07-09 MED ORDER — HYDROCHLOROTHIAZIDE 12.5 MG PO CAPS: 12.5000 mg | ORAL_CAPSULE | Freq: Two times a day (BID) | ORAL | 1 refills | Status: DC

## 2017-07-16 DIAGNOSIS — H524 Presbyopia: Secondary | ICD-10-CM | POA: Diagnosis not present

## 2017-07-16 DIAGNOSIS — E119 Type 2 diabetes mellitus without complications: Secondary | ICD-10-CM | POA: Diagnosis not present

## 2017-07-16 LAB — HM DIABETES EYE EXAM

## 2017-07-27 ENCOUNTER — Other Ambulatory Visit: Payer: Self-pay | Admitting: Family Medicine

## 2017-07-29 ENCOUNTER — Other Ambulatory Visit: Payer: Self-pay | Admitting: General Practice

## 2017-07-29 MED ORDER — GLUCOSE BLOOD VI STRP: ORAL_STRIP | 12 refills | Status: DC

## 2017-08-01 ENCOUNTER — Encounter: Payer: Self-pay | Admitting: General Practice

## 2017-08-14 ENCOUNTER — Ambulatory Visit (HOSPITAL_COMMUNITY): Payer: Medicare Other | Attending: Cardiology

## 2017-08-14 ENCOUNTER — Other Ambulatory Visit: Payer: Self-pay

## 2017-08-14 DIAGNOSIS — I272 Pulmonary hypertension, unspecified: Secondary | ICD-10-CM | POA: Diagnosis not present

## 2017-08-14 DIAGNOSIS — I270 Primary pulmonary hypertension: Secondary | ICD-10-CM | POA: Diagnosis not present

## 2017-08-14 DIAGNOSIS — R609 Edema, unspecified: Secondary | ICD-10-CM | POA: Diagnosis not present

## 2017-08-14 DIAGNOSIS — E119 Type 2 diabetes mellitus without complications: Secondary | ICD-10-CM | POA: Insufficient documentation

## 2017-08-14 DIAGNOSIS — E669 Obesity, unspecified: Secondary | ICD-10-CM | POA: Diagnosis not present

## 2017-08-14 DIAGNOSIS — I517 Cardiomegaly: Secondary | ICD-10-CM | POA: Insufficient documentation

## 2017-08-14 DIAGNOSIS — E785 Hyperlipidemia, unspecified: Secondary | ICD-10-CM | POA: Diagnosis not present

## 2017-08-14 DIAGNOSIS — G4733 Obstructive sleep apnea (adult) (pediatric): Secondary | ICD-10-CM | POA: Diagnosis not present

## 2017-08-19 ENCOUNTER — Encounter: Payer: Self-pay | Admitting: Cardiology

## 2017-08-19 ENCOUNTER — Other Ambulatory Visit: Payer: Self-pay | Admitting: Emergency Medicine

## 2017-08-19 ENCOUNTER — Ambulatory Visit (INDEPENDENT_AMBULATORY_CARE_PROVIDER_SITE_OTHER): Payer: Medicare Other | Admitting: Cardiology

## 2017-08-19 VITALS — BP 144/60 | HR 66 | Ht 60.0 in | Wt 213.1 lb

## 2017-08-19 DIAGNOSIS — I10 Essential (primary) hypertension: Secondary | ICD-10-CM

## 2017-08-19 MED ORDER — RAMIPRIL 10 MG PO CAPS: 20.0000 mg | ORAL_CAPSULE | Freq: Every day | ORAL | 0 refills | Status: DC

## 2017-08-19 NOTE — Progress Notes (Signed)
08/19/2017 Kayla Brown   01/31/1951  [id number]  Primary Physician Sheliah Hatchabori, Katherine E, MD Primary Cardiologist: Dr.Turner   Reason for Visit/CC: F/u for Pulmonary HTN, Essential HTN and DM  HPI:  Kayla Brown is a 67 y.o. female who is being seen today for routien f/u. She is followed by Dr. Mayford Knifeurner and has a h/o OSA, HTN, DM, Obesity, moderate pulmonary HTN (PASP 43mmHg by echo 02/2015)and dyslipidemia. She has severe OSA but is compliant with CPAP.   Dr. Mayford Knifeurner recently ordered a f/u 2D echo 08/14/17 which showed improvement in PA pressure, down from 43 mmgHg previously to 33 mmHg. LVEF also normal at 60-65%. No WMA. She was noted to have mild MR, TR and mild AR.   From a cardiac standpoint, she has done well. She denies CP. She has some mild dyspnea with moderate exertion but she feels that this is gradually getting better with weight loss. She has been walking for exercise each day which has helped significantly. She also denies palpitations, dizziness, syncope/ near syncope, orthopnea and PND. She is fully compliant with CPAP. Her BP is mildly elevated but she did not take her HCTZ this morning due to needing to come in for office visit. She plans to take once she returns home.    Cardiac Studies/ Procedures 2D Echo 08/14/17  Study Conclusions  - Left ventricle: The cavity size was normal. Systolic function was   normal. The estimated ejection fraction was in the range of 60%   to 65%. Wall motion was normal; there were no regional wall   motion abnormalities. Left ventricular diastolic function   parameters were normal. - Aortic valve: There was trivial regurgitation. - Mitral valve: There was trivial regurgitation. - Atrial septum: There was increased thickness of the septum,   consistent with lipomatous hypertrophy. - Tricuspid valve: There was trivial regurgitation. - Pulmonary arteries: PA peak pressure: 33 mm Hg (S).   Current Meds  Medication Sig  . Alcohol Swabs  (ALCOHOL PREP) PADS Pt uses an alcohol pad each time sugars are tested. Pt tests twice daily. Dx E11.9  . amLODipine (NORVASC) 10 MG tablet Take 1 tablet (10 mg total) by mouth daily.  [name]. atorvastatin (LIPITOR) 40 MG tablet Take 1 tablet (40 mg total) by mouth daily.  . cetirizine (ZYRTEC) 10 MG tablet Take 10 mg by mouth daily as needed.   . Cholecalciferol (VITAMIN D PO) Take 2,000 Int'l Units by mouth daily.   . cyclobenzaprine (FLEXERIL) 10 MG tablet Take 1 tablet (10 mg total) by mouth 3 (three) times daily as needed for muscle spasms.  [name]. estradiol (ESTRACE) 0.5 MG tablet Take 0.5 tablets (0.25 mg total) by mouth daily.  . fish oil-omega-3 fatty acids 1000 MG capsule Take 1 g by mouth daily.  [name]. glipiZIDE (GLIPIZIDE XL) 10 MG 24 hr tablet Take 1 tablet (10 mg total) by mouth daily with breakfast.  . glucose blood (FREESTYLE LITE) test strip USE TO TEST BLOOD GLUCOSE 1 TO 2 TIMES DAILY. Dx E11.9  . hydrochlorothiazide (MICROZIDE) 12.5 MG capsule Take 1 capsule (12.5 mg total) by mouth 2 (two) times daily.  . meclizine (ANTIVERT) 25 MG tablet Take 1 tablet (25 mg total) by mouth 3 (three) times daily as needed for dizziness.  [name]. MELATONIN PO Take 3 mg by mouth at bedtime.   . metFORMIN (GLUCOPHAGE-XR) 500 MG 24 hr tablet TAKE 2 TABLETS BY MOUTH TWICE DAILY BEFORE A MEAL  . Multiple Vitamins-Minerals (VISION FORMULA) TABS Take 1 tablet  by mouth every morning.   [name] OVER THE COUNTER MEDICATION Equate Vision Formula 50+  . pioglitazone (ACTOS) 30 MG tablet Take 1 tablet (30 mg total) by mouth daily.  . progesterone (PROMETRIUM) 100 MG capsule Take 1 capsule (100 mg total) by mouth daily.  . ramipril (ALTACE) 10 MG capsule TAKE 2 CAPSULES BY MOUTH ONCE DAILY  . spironolactone (ALDACTONE) 25 MG tablet Take 0.5 tablets (12.5 mg total) by mouth daily. Please make yearly appt with Dr. Mayford Knife for March before anymore refills. 1st attempt  . TRUEPLUS LANCETS 30G MISC Use one lancet each time sugars are  tested. Pt tests twice daily. Dx. E11.9  . venlafaxine (EFFEXOR) 37.5 MG tablet TAKE 1 TABLET BY MOUTH DAILY.   No Active Allergies Past Medical History:  Diagnosis Date  . Anxiety   . Diabetes mellitus   . Edema of extremities 03/02/2016  . Essential hypertension, benign   . Lazy eye   . Major depressive disorder, single episode, unspecified   . Obesity (BMI 30-39.9) 03/01/2016  . OSA (obstructive sleep apnea)   . Pulmonary HTN (HCC)    mild with PASP by echo 05/2016  . Pure hypercholesterolemia   . Retinopathy, due to hypertension    Family History  Adopted: Yes  Problem Relation Age of Onset  . Alcohol abuse Mother   . ADD / ADHD Mother   . Dementia Mother    Past Surgical History:  Procedure Laterality Date  . CPAP TITRATION  10/14/2015  . DILATION AND CURETTAGE OF UTERUS  2005-2008   x2  . EYE SURGERY     cataracts  . WISDOM TOOTH EXTRACTION     Social History   Socioeconomic History  . Marital status: Married    Spouse name: Not on file  . Number of children: Not on file  . Years of education: Not on file  . Highest education level: Not on file  Social Needs  . Financial resource strain: Not on file  . Food insecurity - worry: Not on file  . Food insecurity - inability: Not on file  . Transportation needs - medical: Not on file  . Transportation needs - non-medical: Not on file  Occupational History  . Not on file  Tobacco Use  . Smoking status: Never Smoker  . Smokeless tobacco: Never Used  Substance and Sexual Activity  . Alcohol use: No    Alcohol/week: 0.0 oz  . Drug use: No  . Sexual activity: Yes    Partners: Male    Birth control/protection: None  Other Topics Concern  . Not on file  Social History Narrative  . Not on file     Review of Systems: General: negative for chills, fever, night sweats or weight changes.  Cardiovascular: negative for chest pain, dyspnea on exertion, edema, orthopnea, palpitations, paroxysmal nocturnal  dyspnea or shortness of breath Dermatological: negative for rash Respiratory: negative for cough or wheezing Urologic: negative for hematuria Abdominal: negative for nausea, vomiting, diarrhea, bright red blood per rectum, melena, or hematemesis Neurologic: negative for visual changes, syncope, or dizziness All other systems reviewed and are otherwise negative except as noted above.   Physical Exam:  Blood pressure (!) 144/60, pulse 66, height 5' (1.524 m), weight 213 lb 1.9 oz (96.7 kg), last menstrual period 06/25/2008, SpO2 97 %.  General appearance: alert, cooperative and no distress Neck: no carotid bruit and no JVD Lungs: clear to auscultation bilaterally Heart: regular rate and rhythm, S1, S2 normal, no murmur, click,  rub or gallop Extremities: extremities normal, atraumatic, no cyanosis or edema Pulses: 2+ and symmetric Skin: Skin color, texture, turgor normal. No rashes or lesions Neurologic: Grossly normal  EKG NSR-- personally reviewed   ASSESSMENT AND PLAN:   1. H/o Pulmonary HTN: recent echo showed improvement in PA pressure, down from 43 mmHg previously to now 33 mmHg. Pt feels her breathing has improved. Better exercise tolerance. Pt encouraged to continue with CPAP nightly and to continue to exercise for weight loss.   2. Essential HTN: fairly controlled. 144/60 today, however pt held diuretic today given appt. She plans to take once she returns home.   3. OSA: she reports full compliance with CPAP. Pt instructed to continue.   4. DLD: Lipids well controlled. recently checked by PCP. LDL 51 mg/dL. Hepatic function normal.   5. Mild MR/TR and AR: mild valvular disease noted on recent echo.   6. Obesity: pt has increased physical activity. She walks daily for exercise. We also discussed low fat/ low carb, heart healthy diet. Pt was given pt education material bout healthy eating habits.   Follow-Up w/ Dr. Mayford Knife in 6 months.   Brittainy Delmer Islam, MHS [facility] 08/19/2017 10:21 AM

## 2017-08-19 NOTE — Patient Instructions (Signed)
Medication Instructions:  1. Your physician recommends that you continue on your current medications as directed. Please refer to the Current Medication list given to you today.   Labwork: NONE ORDERED TODAY  Testing/Procedures: NONE ORDERED TODAY  Follow-Up: Your physician wants you to follow-up in: 6 MONTHS WITH DR. Sherlyn Brown  You will receive a reminder letter in the mail two months in advance. If you don't receive a letter, please call our office to schedule the follow-up appointment.   Any Other Special Instructions Will Be Listed Below (If Applicable).  If you need a refill on your cardiac medications before your next appointment, please call your pharmacy.  Fat and Cholesterol Restricted Diet High levels of fat and cholesterol in your blood may lead to various health problems, such as diseases of the heart, blood vessels, gallbladder, liver, and pancreas. Fats are concentrated sources of energy that come in various forms. Certain types of fat, including saturated fat, may be harmful in excess. Cholesterol is a substance needed by your body in small amounts. Your body makes all the cholesterol it needs. Excess cholesterol comes from the food you eat. When you have high levels of cholesterol and saturated fat in your blood, health problems can develop because the excess fat and cholesterol will gather along the walls of your blood vessels, causing them to narrow. Choosing the right foods will help you control your intake of fat and cholesterol. This will help keep the levels of these substances in your blood within normal limits and reduce your risk of disease. What is my plan? Your health care provider recommends that you:  Limit your fat intake to ______% or less of your total calories per day.  Limit the amount of cholesterol in your diet to less than _________mg per day.  Eat 20-30 grams of fiber each day.  What types of fat should I choose?  Choose healthy fats more often.  Choose monounsaturated and polyunsaturated fats, such as olive and canola oil, flaxseeds, walnuts, almonds, and seeds.  Eat more omega-3 fats. Good choices include salmon, mackerel, sardines, tuna, flaxseed oil, and ground flaxseeds. Aim to eat fish at least two times a week.  Limit saturated fats. Saturated fats are primarily found in animal products, such as meats, butter, and cream. Plant sources of saturated fats include palm oil, palm kernel oil, and coconut oil.  Avoid foods with partially hydrogenated oils in them. These contain trans fats. Examples of foods that contain trans fats are stick margarine, some tub margarines, cookies, crackers, and other baked goods. What general guidelines do I need to follow? These guidelines for healthy eating will help you control your intake of fat and cholesterol:  Check food labels carefully to identify foods with trans fats or high amounts of saturated fat.  Fill one half of your plate with vegetables and green salads.  Fill one fourth of your plate with whole grains. Look for the word "whole" as the first word in the ingredient list.  Fill one fourth of your plate with lean protein foods.  Limit fruit to two servings a day. Choose fruit instead of juice.  Eat more foods that contain fiber, such as apples, broccoli, carrots, beans, peas, and barley.  Eat more home-cooked food and less restaurant, buffet, and fast food.  Limit or avoid alcohol.  Limit foods high in starch and sugar.  Limit fried foods.  Cook foods using methods other than frying. Baking, boiling, grilling, and broiling are all great options.  Lose weight  if you are overweight. Losing just 5-10% of your initial body weight can help your overall health and prevent diseases such as diabetes and heart disease.  What foods can I eat? Grains  Whole grains, such as whole wheat or whole grain breads, crackers, cereals, and pasta. Unsweetened oatmeal, bulgur, barley, quinoa, or  brown rice. Corn or whole wheat flour tortillas. Vegetables  Fresh or frozen vegetables (raw, steamed, roasted, or grilled). Green salads. Fruits  All fresh, canned (in natural juice), or frozen fruits. Meats and other protein foods  Ground beef (85% or leaner), grass-fed beef, or beef trimmed of fat. Skinless chicken or [place]. Ground chicken or [place]. Pork trimmed of fat. All fish and seafood. Eggs. Dried beans, peas, or lentils. Unsalted nuts or seeds. Unsalted canned or dry beans. Dairy  Low-fat dairy products, such as skim or 1% milk, 2% or reduced-fat cheeses, low-fat ricotta or cottage cheese, or plain low-fat yo Fats and oils  Tub margarines without trans fats. Light or reduced-fat mayonnaise and salad dressings. Avocado. Olive, canola, sesame, or safflower oils. Natural peanut or almond butter (choose ones without added sugar and oil). The items listed above may not be a complete list of recommended foods or beverages. Contact your dietitian for more options. Foods to avoid Grains  White bread. White pasta. White rice. Cornbread. Bagels, pastries, and croissants. Crackers that contain trans fat. Vegetables  White potatoes. Corn. Creamed or fried vegetables. Vegetables in a cheese sauce. Fruits  Dried fruits. Canned fruit in light or heavy syrup. Fruit juice. Meats and other protein foods  Fatty cuts of meat. Ribs, chicken wings, bacon, sausage, bologna, salami, chitterlings, fatback, hot dogs, bratwurst, and packaged luncheon meats. Liver and organ meats. Dairy  Whole or 2% milk, cream, half-and-half, and cream cheese. Whole milk cheeses. Whole-fat or sweetened yogurt. Full-fat cheeses. Nondairy creamers and whipped toppings. Processed cheese, cheese spreads, or cheese curds. Beverages  Alcohol. Sweetened drinks (such as sodas, lemonade, and fruit drinks or punches). Fats and oils  Butter, stick margarine, lard, shortening, ghee, or bacon fat. Coconut, palm kernel, or  palm oils. Sweets and desserts  Corn syrup, sugars, honey, and molasses. Candy. Jam and jelly. Syrup. Sweetened cereals. Cookies, pies, cakes, donuts, muffins, and ice cream. The items listed above may not be a complete list of foods and beverages to avoid. Contact your dietitian for more information. This information is not intended to replace advice given to you by your health care provider. Make sure you discuss any questions you have with your health care provider. Document Released: 06/11/2005 Document Revised: 07/02/2014 Document Reviewed: 09/09/2013 Elsevier Interactive Patient Education  2018 [employer].   Heart-Healthy Eating Plan Heart-healthy meal planning includes:  Limiting unhealthy fats.  Increasing healthy fats.  Making other small dietary changes.  You may need to talk with your doctor or a diet specialist (dietitian) to create an eating plan that is right for you. What types of fat should I choose?  Choose healthy fats. These include olive oil and canola oil, flaxseeds, walnuts, almonds, and seeds.  Eat more omega-3 fats. These include salmon, mackerel, sardines, tuna, flaxseed oil, and ground flaxseeds. Try to eat fish at least twice each week.  Limit saturated fats. ? Saturated fats are often found in animal products, such as meats, butter, and cream. ? Plant sources of saturated fats include palm oil, palm kernel oil, and coconut oil.  Avoid foods with partially hydrogenated oils in them. These include stick margarine, some tub margarines, cookies, crackers,  and other baked goods. These contain trans fats. What general guidelines do I need to follow?  Check food labels carefully. Identify foods with trans fats or high amounts of saturated fat.  Fill one half of your plate with vegetables and green salads. Eat 4-5 servings of vegetables per day. A serving of vegetables is: ? 1 cup of raw leafy vegetables. ?  cup of raw or cooked cut-up vegetables. ?  cup  of vegetable juice.  Fill one fourth of your plate with whole grains. Look for the word "whole" as the first word in the ingredient list.  Fill one fourth of your plate with lean protein foods.  Eat 4-5 servings of fruit per day. A serving of fruit is: ? One medium whole fruit. ?  cup of dried fruit. ?  cup of fresh, frozen, or canned fruit. ?  cup of 100% fruit juice.  Eat more foods that contain soluble fiber. These include apples, broccoli, carrots, beans, peas, and barley. Try to get 20-30 g of fiber per day.  Eat more home-cooked food. Eat less restaurant, buffet, and fast food.  Limit or avoid alcohol.  Limit foods high in starch and sugar.  Avoid fried foods.  Avoid frying your food. Try baking, boiling, grilling, or broiling it instead. You can also reduce fat by: ? Removing the skin from poultry. ? Removing all visible fats from meats. ? Skimming the fat off of stews, soups, and gravies before serving them. ? Steaming vegetables in water or broth.  Lose weight if you are overweight.  Eat 4-5 servings of nuts, legumes, and seeds per week: ? One serving of dried beans or legumes equals  cup after being cooked. ? One serving of nuts equals 1 ounces. ? One serving of seeds equals  ounce or one tablespoon.  You may need to keep track of how much salt or sodium you eat. This is especially true if you have high blood pressure. Talk with your doctor or dietitian to get more information. What foods can I eat? Grains Breads, including [place], white, pita, wheat, raisin, rye, oatmeal, and [place]. Tortillas that are neither fried nor made with lard or trans fat. Low-fat rolls, including hotdog and hamburger buns and English muffins. Biscuits. Muffins. Waffles. Pancakes. Light popcorn. Whole-grain cereals. Flatbread. Melba toast. Pretzels. Breadsticks. Rusks. Low-fat snacks. Low-fat crackers, including oyster, saltine, matzo, graham, animal, and rye. Rice and pasta, including  brown rice and pastas that are made with whole wheat. Vegetables All vegetables. Fruits All fruits, but limit coconut. Meats and Other Protein Sources Lean, well-trimmed beef, veal, pork, and lamb. Chicken and [place] without skin. All fish and shellfish. Wild duck, rabbit, pheasant, and venison. Egg whites or low-cholesterol egg substitutes. Dried beans, peas, lentils, and tofu. Seeds and most nuts. Dairy Low-fat or nonfat cheeses, including ricotta, string, and mozzarella. Skim or 1% milk that is liquid, powdered, or evaporated. Buttermilk that is made with low-fat milk. Nonfat or low-fat yogurt. Beverages Mineral water. Diet carbonated beverages. Sweets and Desserts Sherbets and fruit ices. Honey, jam, marmalade, jelly, and syrups. Meringues and gelatins. Pure sugar candy, such as hard candy, jelly beans, gumdrops, mints, marshmallows, and small amounts of dark chocolate. [employer]. Eat all sweets and desserts in moderation. Fats and Oils Nonhydrogenated (trans-free) margarines. Vegetable oils, including soybean, sesame, sunflower, olive, peanut, safflower, corn, canola, and cottonseed. Salad dressings or mayonnaise made with a vegetable oil. Limit added fats and oils that you use for cooking, baking, salads, and  as spreads. Other Cocoa powder. Coffee and tea. All seasonings and condiments. The items listed above may not be a complete list of recommended foods or beverages. Contact your dietitian for more options. What foods are not recommended? Grains Breads that are made with saturated or trans fats, oils, or whole milk. Croissants. Butter rolls. Cheese breads. Sweet rolls. Donuts. Buttered popcorn. Chow mein noodles. High-fat crackers, such as cheese or butter crackers. Meats and Other Protein Sources Fatty meats, such as hotdogs, short ribs, sausage, spareribs, bacon, rib eye roast or steak, and mutton. High-fat deli meats, such as salami and bologna. Caviar. Domestic duck and  goose. Organ meats, such as kidney, liver, sweetbreads, and heart. Dairy Cream, sour cream, cream cheese, and creamed cottage cheese. Whole-milk cheeses, including blue (bleu), [address], [place], [place], [address], [place], [address], cheddar, [place], and [place]. Whole or 2% milk that is liquid, evaporated, or condensed. Whole buttermilk. Cream sauce or high-fat cheese sauce. Yogurt that is made from whole milk. Beverages Regular sodas and juice drinks with added sugar. Sweets and Desserts Frosting. Pudding. Cookies. Cakes other than angel food cake. Candy that has milk chocolate or white chocolate, hydrogenated fat, butter, coconut, or unknown ingredients. Buttered syrups. Full-fat ice cream or ice cream drinks. Fats and Oils Gravy that has suet, meat fat, or shortening. Cocoa butter, hydrogenated oils, palm oil, coconut oil, palm kernel oil. These can often be found in baked products, candy, fried foods, nondairy creamers, and whipped toppings. Solid fats and shortenings, including bacon fat, salt pork, lard, and butter. Nondairy cream substitutes, such as coffee creamers and sour cream substitutes. Salad dressings that are made of unknown oils, cheese, or sour cream. The items listed above may not be a complete list of foods and beverages to avoid. Contact your dietitian for more information. This information is not intended to replace advice given to you by your health care provider. Make sure you discuss any questions you have with your health care provider. Document Released: 12/11/2011 Document Revised: 11/17/2015 Document Reviewed: 12/03/2013 Elsevier Interactive Patient Education  [employer].

## 2017-09-08 ENCOUNTER — Other Ambulatory Visit: Payer: Self-pay | Admitting: Cardiology

## 2017-09-23 ENCOUNTER — Ambulatory Visit (INDEPENDENT_AMBULATORY_CARE_PROVIDER_SITE_OTHER): Payer: Medicare Other | Admitting: Physician Assistant

## 2017-09-23 ENCOUNTER — Encounter: Payer: Self-pay | Admitting: Physician Assistant

## 2017-09-23 ENCOUNTER — Other Ambulatory Visit: Payer: Self-pay

## 2017-09-23 VITALS — BP 138/72 | HR 60 | Temp 97.2°F | Resp 14 | Ht 60.0 in | Wt 214.0 lb

## 2017-09-23 DIAGNOSIS — B9789 Other viral agents as the cause of diseases classified elsewhere: Secondary | ICD-10-CM | POA: Diagnosis not present

## 2017-09-23 DIAGNOSIS — J329 Chronic sinusitis, unspecified: Secondary | ICD-10-CM | POA: Diagnosis not present

## 2017-09-23 MED ORDER — BENZONATATE 100 MG PO CAPS: 100.0000 mg | ORAL_CAPSULE | Freq: Two times a day (BID) | ORAL | 0 refills | Status: DC | PRN

## 2017-09-23 MED ORDER — LORATADINE 10 MG PO TABS: 10.0000 mg | ORAL_TABLET | Freq: Every day | ORAL | 3 refills | Status: DC

## 2017-09-23 MED ORDER — FLUTICASONE PROPIONATE 50 MCG/ACT NA SUSP: 2.0000 | Freq: Every day | NASAL | 0 refills | Status: DC

## 2017-09-23 NOTE — Patient Instructions (Signed)
We are sorry that you are not feeling well.  Here is how we plan to help!  Based on what you have shared with me it looks like you have sinusitis.  Sinusitis is inflammation and infection in the sinus cavities of the head.  Based on your presentation I believe you most likely have Acute Viral Sinusitis.This is an infection most likely caused by a virus. There is not specific treatment for viral sinusitis other than to help you with the symptoms until the infection runs its course.  You may use plain Mucinex. Saline nasal spray help and can safely be used as often as needed for congestion, I have prescribed: Fluticasone nasal spray two sprays in each nostril once a day. You can start the Tessalon for cough as well. I have sent in a prescription for loratadine for you to take to help with allergy symptoms.   Some authorities believe that zinc sprays or the use of Echinacea may shorten the course of your symptoms.  Sinus infections are not as easily transmitted as other respiratory infection, however we still recommend that you avoid close contact with loved ones, especially the very young and elderly.  Remember to wash your hands thoroughly throughout the day as this is the number one way to prevent the spread of infection!  Home Care:  Only take medications as instructed by your medical team.  Do not take these medications with alcohol.  A steam or ultrasonic humidifier can help congestion.  You can place a towel over your head and breathe in the steam from hot water coming from a faucet.  Avoid close contacts especially the very young and the elderly.  Cover your mouth when you cough or sneeze.  Always remember to wash your hands.  Get Help Right Away If:  You develop worsening fever or sinus pain.  You develop a severe head ache or visual changes.  Your symptoms persist after you have completed your treatment plan.  Make sure you  Understand these instructions.  Will watch your  condition.  Will get help right away if you are not doing well or get worse.

## 2017-09-23 NOTE — Addendum Note (Signed)
Addended by: Waldon MerlMARTIN, Ayline Dingus C on: 09/23/2017 03:10 PM   Modules accepted: Orders

## 2017-09-23 NOTE — Progress Notes (Signed)
Patient presents to clinic today with concern for sinus infection. Patient endorses 1 week of sneezing and nasal congestion. Has also noted sore throat and mild cough that is resolving. Still noting sinus pressure and watery, itchy eyes. Denies fever. Husband with similar symptoms starting after her. Denies wheezing, chest pain. Does note mild lower chest tenderness 2/2 coughing. Patient has taken OTC cold medications to help with symptoms. Notes some mild improvement in symptoms with this.   Past Medical History:  Diagnosis Date  . Anxiety   . Diabetes mellitus   . Edema of extremities 03/02/2016  . Essential hypertension, benign   . Lazy eye   . Major depressive disorder, single episode, unspecified   . Obesity (BMI 30-39.9) 03/01/2016  . OSA (obstructive sleep apnea)   . Pulmonary HTN (HCC)    mild with PASP by echo 05/2016  . Pure hypercholesterolemia   . Retinopathy, due to hypertension     Current Outpatient Medications on File Prior to Visit  Medication Sig Dispense Refill  . Alcohol Swabs (ALCOHOL PREP) PADS Pt uses an alcohol pad each time sugars are tested. Pt tests twice daily. Dx E11.9 100 each 3  . amLODipine (NORVASC) 10 MG tablet Take 1 tablet (10 mg total) by mouth daily. 90 tablet 1  . atorvastatin (LIPITOR) 40 MG tablet Take 1 tablet (40 mg total) by mouth daily. 90 tablet 1  . cetirizine (ZYRTEC) 10 MG tablet Take 10 mg by mouth daily as needed.     . Cholecalciferol (VITAMIN D PO) Take 2,000 Int'l Units by mouth daily.     . cyclobenzaprine (FLEXERIL) 10 MG tablet Take 1 tablet (10 mg total) by mouth 3 (three) times daily as needed for muscle spasms. 30 tablet 0  . estradiol (ESTRACE) 0.5 MG tablet Take 0.5 tablets (0.25 mg total) by mouth daily. 90 tablet 4  . fish oil-omega-3 fatty acids 1000 MG capsule Take 1 g by mouth daily.    [name] glipiZIDE (GLIPIZIDE XL) 10 MG 24 hr tablet Take 1 tablet (10 mg total) by mouth daily with breakfast. 90 tablet 1  . glucose  blood (FREESTYLE LITE) test strip USE TO TEST BLOOD GLUCOSE 1 TO 2 TIMES DAILY. Dx E11.9 100 each 12  . hydrochlorothiazide (MICROZIDE) 12.5 MG capsule Take 1 capsule (12.5 mg total) by mouth 2 (two) times daily. 180 capsule 1  . meclizine (ANTIVERT) 25 MG tablet Take 1 tablet (25 mg total) by mouth 3 (three) times daily as needed for dizziness. 30 tablet 0  . MELATONIN PO Take 3 mg by mouth at bedtime.     . metFORMIN (GLUCOPHAGE-XR) 500 MG 24 hr tablet TAKE 2 TABLETS BY MOUTH TWICE DAILY BEFORE A MEAL 360 tablet 0  . Multiple Vitamins-Minerals (VISION FORMULA) TABS Take 1 tablet by mouth every morning.     [name] OVER THE COUNTER MEDICATION Equate Vision Formula 50+    . pioglitazone (ACTOS) 30 MG tablet Take 1 tablet (30 mg total) by mouth daily. 90 tablet 1  . progesterone (PROMETRIUM) 100 MG capsule Take 1 capsule (100 mg total) by mouth daily. 90 capsule 4  . ramipril (ALTACE) 10 MG capsule Take 2 capsules (20 mg total) by mouth daily. 180 capsule 0  . spironolactone (ALDACTONE) 25 MG tablet TAKE 0.5 TABLETS BY MOUTH DAILY. PLEASE MAKE APPT WITH DR. Mayford Knife FOR MARCH BEFORE ANYMORE REFILLS. 30 tablet 10  . TRUEPLUS LANCETS 30G MISC Use one lancet each time sugars are tested. Pt tests twice  daily. Dx. E11.9 100 each 3  . venlafaxine (EFFEXOR) 37.5 MG tablet TAKE 1 TABLET BY MOUTH DAILY. 90 tablet 0   No current facility-administered medications on file prior to visit.     No Active Allergies  Family History  Adopted: Yes  Problem Relation Age of Onset  . Alcohol abuse Mother   . ADD / ADHD Mother   . Dementia Mother     Social History   Socioeconomic History  . Marital status: Married    Spouse name: Not on file  . Number of children: Not on file  . Years of education: Not on file  . Highest education level: Not on file  Occupational History  . Not on file  Social Needs  . Financial resource strain: Not on file  . Food insecurity:    Worry: Not on file    Inability: Not on  file  . Transportation needs:    Medical: Not on file    Non-medical: Not on file  Tobacco Use  . Smoking status: Never Smoker  . Smokeless tobacco: Never Used  Substance and Sexual Activity  . Alcohol use: No    Alcohol/week: 0.0 oz  . Drug use: No  . Sexual activity: Yes    Partners: Male    Birth control/protection: None  Lifestyle  . Physical activity:    Days per week: Not on file    Minutes per session: Not on file  . Stress: Not on file  Relationships  . Social connections:    Talks on phone: Not on file    Gets together: Not on file    Attends religious service: Not on file    Active member of club or organization: Not on file    Attends meetings of clubs or organizations: Not on file    Relationship status: Not on file  Other Topics Concern  . Not on file  Social History Narrative  . Not on file   Review of Systems - See HPI.  All other ROS are negative.  BP 138/72   Pulse 60   Temp (!) 97.2 F (36.2 C) (Oral)   Resp 14   Ht 5' (1.524 m)   Wt 214 lb (97.1 kg)   LMP 06/25/2008   SpO2 97%   BMI 41.79 kg/m   Physical Exam  Constitutional: She is oriented to person, place, and time and well-developed, well-nourished, and in no distress.  HENT:  Head: Normocephalic and atraumatic.  Right Ear: Tympanic membrane normal.  Left Ear: Tympanic membrane normal.  Nose: Nose normal.  Mouth/Throat: Uvula is midline, oropharynx is clear and moist and mucous membranes are normal.  Eyes: Conjunctivae are normal.  Neck: Neck supple.  Cardiovascular: Normal rate, regular rhythm, normal heart sounds and intact distal pulses.  Pulmonary/Chest: Effort normal and breath sounds normal. No respiratory distress. She has no wheezes. She has no rales. She exhibits no tenderness.  Neurological: She is alert and oriented to person, place, and time.  Skin: Skin is warm and dry. No rash noted.  Psychiatric: Affect normal.  Vitals reviewed.  Recent Results (from the past 2160  hour(s))  Hemoglobin A1c     Status: Abnormal   Collection Time: 07/01/17  3:26 PM  Result Value Ref Range   Hgb A1c MFr Bld 7.7 (H) 4.6 - 6.5 %    Comment: Glycemic Control Guidelines for People with Diabetes:Non Diabetic:  <6%Goal of Therapy: <7%Additional Action Suggested:  >8%   Basic metabolic panel  Status: Abnormal   Collection Time: 07/01/17  3:26 PM  Result Value Ref Range   Sodium 139 135 - 145 mEq/L   Potassium 5.2 (H) 3.5 - 5.1 mEq/L   Chloride 103 96 - 112 mEq/L   CO2 24 19 - 32 mEq/L   Glucose, Bld 171 (H) 70 - 99 mg/dL   BUN 29 (H) 6 - 23 mg/dL   Creatinine, Ser 5.63 0.40 - 1.20 mg/dL   Calcium 9.9 8.4 - 87.5 mg/dL   GFR 64.33 (L) >29.51 mL/min  HM DIABETES EYE EXAM     Status: None   Collection Time: 07/16/17 12:00 AM  Result Value Ref Range   HM Diabetic Eye Exam No Retinopathy No Retinopathy   Assessment/Plan: 1. Viral sinusitis Hydrate. Rest. Tessalon for cough along with Flonase, Loratadine and plain Mucinex. Supportive measures reviewed. Follow-up if not resolving.  - benzonatate (TESSALON) 100 MG capsule; Take 1 capsule (100 mg total) by mouth 2 (two) times daily as needed for cough.  Dispense: 20 capsule; Refill: 0 - fluticasone (FLONASE) 50 MCG/ACT nasal spray; Place 2 sprays into both nostrils daily.  Dispense: 16 g; Refill: 0 - loratadine (CLARITIN) 10 MG tablet; Take 1 tablet (10 mg total) by mouth daily.  Dispense: 30 tablet; Refill: 3   Piedad Climes, [place]

## 2017-09-26 ENCOUNTER — Other Ambulatory Visit: Payer: Self-pay | Admitting: Obstetrics & Gynecology

## 2017-09-26 DIAGNOSIS — Z1231 Encounter for screening mammogram for malignant neoplasm of breast: Secondary | ICD-10-CM

## 2017-10-04 ENCOUNTER — Other Ambulatory Visit: Payer: Self-pay | Admitting: Family Medicine

## 2017-10-05 ENCOUNTER — Other Ambulatory Visit: Payer: Self-pay | Admitting: Family Medicine

## 2017-10-06 ENCOUNTER — Other Ambulatory Visit: Payer: Self-pay | Admitting: Family Medicine

## 2017-10-16 ENCOUNTER — Telehealth: Payer: Self-pay | Admitting: Family Medicine

## 2017-10-16 ENCOUNTER — Other Ambulatory Visit: Payer: Self-pay | Admitting: Emergency Medicine

## 2017-10-16 MED ORDER — GLUCOSE BLOOD VI STRP: ORAL_STRIP | 12 refills | Status: DC

## 2017-10-16 NOTE — Telephone Encounter (Signed)
Copied from CRM [id number]. Topic: Quick Communication - See Telephone Encounter >> Oct 16, 2017 11:47 AM Arlyss Gandyichardson, Keshun Berrett N, NT wrote: CRM for notification. See Telephone encounter for: 10/16/17. Todd with [facility] calling and states that the rx directions for the glucose blood (FREESTYLE LITE) test strip needs to be changed. He states Medicare part B will not cover the medication if the instructions are not specific. It needs to state "use 2 times a day" instead of "use 1 or 2 times a day". CB#: [phone]

## 2017-10-17 NOTE — Telephone Encounter (Signed)
Corrected instructions on current prescription.

## 2017-10-20 ENCOUNTER — Other Ambulatory Visit: Payer: Self-pay | Admitting: Physician Assistant

## 2017-10-20 DIAGNOSIS — J329 Chronic sinusitis, unspecified: Principal | ICD-10-CM

## 2017-10-20 DIAGNOSIS — B9789 Other viral agents as the cause of diseases classified elsewhere: Secondary | ICD-10-CM

## 2017-10-21 ENCOUNTER — Other Ambulatory Visit: Payer: Self-pay

## 2017-10-21 ENCOUNTER — Ambulatory Visit (INDEPENDENT_AMBULATORY_CARE_PROVIDER_SITE_OTHER): Payer: Medicare Other | Admitting: Family Medicine

## 2017-10-21 ENCOUNTER — Encounter: Payer: Self-pay | Admitting: Family Medicine

## 2017-10-21 VITALS — BP 123/70 | HR 78 | Temp 98.1°F | Resp 16 | Ht 60.0 in | Wt 217.0 lb

## 2017-10-21 DIAGNOSIS — E119 Type 2 diabetes mellitus without complications: Secondary | ICD-10-CM | POA: Diagnosis not present

## 2017-10-21 DIAGNOSIS — E785 Hyperlipidemia, unspecified: Secondary | ICD-10-CM

## 2017-10-21 DIAGNOSIS — I10 Essential (primary) hypertension: Secondary | ICD-10-CM | POA: Diagnosis not present

## 2017-10-21 LAB — HEPATIC FUNCTION PANEL
ALT: 14 U/L (ref 0–35)
AST: 11 U/L (ref 0–37)
Albumin: 4.1 g/dL (ref 3.5–5.2)
Alkaline Phosphatase: 59 U/L (ref 39–117)
Bilirubin, Direct: 0.1 mg/dL (ref 0.0–0.3)
Total Bilirubin: 0.3 mg/dL (ref 0.2–1.2)
Total Protein: 6.6 g/dL (ref 6.0–8.3)

## 2017-10-21 LAB — LIPID PANEL
Cholesterol: 123 mg/dL (ref 0–200)
HDL: 48.6 mg/dL (ref >39.00)
NonHDL: 74.22
Total CHOL/HDL Ratio: 3
Triglycerides: 228 mg/dL — ABNORMAL HIGH (ref 0.0–149.0)
VLDL: 45.6 mg/dL — ABNORMAL HIGH (ref 0.0–40.0)

## 2017-10-21 LAB — CBC WITH DIFFERENTIAL/PLATELET
Basophils Absolute: 0.1 10*3/uL (ref 0.0–0.1)
Basophils Relative: 0.6 % (ref 0.0–3.0)
Eosinophils Absolute: 0.3 10*3/uL (ref 0.0–0.7)
Eosinophils Relative: 2.6 % (ref 0.0–5.0)
HCT: 37.8 % (ref 36.0–46.0)
Hemoglobin: 12.5 g/dL (ref 12.0–15.0)
Lymphocytes Relative: 24.5 % (ref 12.0–46.0)
Lymphs Abs: 2.5 10*3/uL (ref 0.7–4.0)
MCHC: 33 g/dL (ref 30.0–36.0)
MCV: 90.1 fl (ref 78.0–100.0)
Monocytes Absolute: 1 10*3/uL (ref 0.1–1.0)
Monocytes Relative: 9.8 % (ref 3.0–12.0)
Neutro Abs: 6.4 10*3/uL (ref 1.4–7.7)
Neutrophils Relative %: 62.5 % (ref 43.0–77.0)
Platelets: 279 10*3/uL (ref 150.0–400.0)
RBC: 4.19 Mil/uL (ref 3.87–5.11)
RDW: 15.6 % — ABNORMAL HIGH (ref 11.5–15.5)
WBC: 10.3 10*3/uL (ref 4.0–10.5)

## 2017-10-21 LAB — BASIC METABOLIC PANEL
BUN: 29 mg/dL — ABNORMAL HIGH (ref 6–23)
CO2: 26 mEq/L (ref 19–32)
Calcium: 10 mg/dL (ref 8.4–10.5)
Chloride: 103 mEq/L (ref 96–112)
Creatinine, Ser: 0.96 mg/dL (ref 0.40–1.20)
GFR: 61.88 mL/min (ref >60.00)
Glucose, Bld: 199 mg/dL — ABNORMAL HIGH (ref 70–99)
Potassium: 4.7 mEq/L (ref 3.5–5.1)
Sodium: 139 mEq/L (ref 135–145)

## 2017-10-21 LAB — TSH: TSH: 2.18 u[IU]/mL (ref 0.35–4.50)

## 2017-10-21 LAB — HEMOGLOBIN A1C: Hgb A1c MFr Bld: 7.7 % — ABNORMAL HIGH (ref 4.6–6.5)

## 2017-10-21 NOTE — Patient Instructions (Addendum)
Follow up in 3-4 months to recheck diabetes We'll notify you of your lab results and make any changes if needed Continue to work on healthy diet and regular exercise- you can do it! Call with any questions or concerns [place]!!! 

## 2017-10-21 NOTE — Assessment & Plan Note (Signed)
Chronic problem.  Tolerating statin w/o difficulty.  Check labs.  Adjust meds prn  

## 2017-10-21 NOTE — Assessment & Plan Note (Signed)
Ongoing issue for pt.  Pt has gained 3 lbs since last visit.  Stressed need for healthy diet and regular exercise.  Will continue to follow.

## 2017-10-21 NOTE — Assessment & Plan Note (Signed)
Chronic problem.  Pt reports she has started exercise- applauded her efforts.  UTD on eye exam, foot exam done today.  On ACE for renal protection.  Check labs.  Adjust meds prn

## 2017-10-21 NOTE — Assessment & Plan Note (Signed)
Chronic problem.  Well controlled.  Asymptomatic.  Check labs.  No anticipated med changes.  Will follow. 

## 2017-10-21 NOTE — Progress Notes (Signed)
   Subjective:    Patient ID: Kayla Brown, female    DOB: [birth year], 67 y.o.   MRN: [id number]  HPI DM- chronic problem, on glipizide  daily, Metformin XR  BID, Actos  daily w/ hx of adequate control.  Last A1C 7.7.  On ACE for renal protection.  UTD on eye exam.  Due for foot exam.  Pt reports she is walking regularly.  Denies symptomatic lows.  No numbness/tingling of hands/feet  Hyperlipidemia- chronic problem.  On Lipitor  daily.  Denies abd pain, N/V.  HTN- chronic problem, on HCTZ 12.5 mg daily, Ramipril  daily, Spironolactone 12.5mg  daily w/ good control.  Denies CP, SOB, HAs, visual changes, edema.   Review of Systems For ROS see HPI     Objective:   Physical Exam  Constitutional: She is oriented to person, place, and time. She appears well-developed and well-nourished. No distress.  obese  HENT:  Head: Normocephalic and atraumatic.  Eyes: Pupils are equal, round, and reactive to light. Conjunctivae and EOM are normal.  Neck: Normal range of motion. Neck supple. No thyromegaly present.  Cardiovascular: Normal rate, regular rhythm, normal heart sounds and intact distal pulses.  No murmur heard. Pulmonary/Chest: Effort normal and breath sounds normal. No respiratory distress.  Abdominal: Soft. She exhibits no distension. There is no tenderness.  Musculoskeletal: She exhibits no edema.  Lymphadenopathy:    She has no cervical adenopathy.  Neurological: She is alert and oriented to person, place, and time.  Skin: Skin is warm and dry.  Psychiatric: She has a normal mood and affect. Her behavior is normal.  Vitals reviewed.         Assessment & Plan:

## 2017-10-23 ENCOUNTER — Encounter: Payer: Self-pay | Admitting: General Practice

## 2017-10-23 LAB — LDL CHOLESTEROL, DIRECT: Direct LDL: 55 mg/dL

## 2017-11-04 ENCOUNTER — Other Ambulatory Visit: Payer: Self-pay | Admitting: Family Medicine

## 2017-11-06 ENCOUNTER — Ambulatory Visit
Admission: RE | Admit: 2017-11-06 | Discharge: 2017-11-06 | Disposition: A | Discharge status: Home / Self Care | Payer: Medicare Other | Source: Ambulatory Visit | Attending: Obstetrics & Gynecology | Admitting: Obstetrics & Gynecology

## 2017-11-06 ENCOUNTER — Other Ambulatory Visit: Payer: Self-pay | Admitting: Family Medicine

## 2017-11-06 DIAGNOSIS — Z1231 Encounter for screening mammogram for malignant neoplasm of breast: Secondary | ICD-10-CM

## 2017-11-22 ENCOUNTER — Encounter: Payer: Self-pay | Admitting: Obstetrics & Gynecology

## 2017-11-22 ENCOUNTER — Ambulatory Visit (INDEPENDENT_AMBULATORY_CARE_PROVIDER_SITE_OTHER): Payer: Medicare Other | Admitting: Obstetrics & Gynecology

## 2017-11-22 VITALS — BP 138/62 | HR 80 | Resp 18 | Ht 59.5 in | Wt 213.0 lb

## 2017-11-22 DIAGNOSIS — Z01419 Encounter for gynecological examination (general) (routine) without abnormal findings: Secondary | ICD-10-CM | POA: Diagnosis not present

## 2017-11-22 NOTE — Progress Notes (Signed)
67 y.o. G0P0000 MarriedCaucasianF here for annual exam.  HbA1C was 7.7.  Goal is 7.0 or below.  Is going to start swimming as soon as her pool is open.     Is going to stop her HRT as she is only taking this every 4 days.  Has been decreasing this very slowly for the past several years.  Patient's last menstrual period was 06/25/2008.          Sexually active: Yes.    The current method of family planning is post menopausal status.    Exercising: Yes.    walking  Smoker:  no  Health Maintenance: Pap:  08/28/16 Neg. HR HPV:neg   History of abnormal Pap:  no MMG:  11/06/17 BIRADS1:Neg  Colonoscopy:  12/14/16 f/u 10 years BMD:   05/03/15 Osteopenia  TDaP:  2011 Pneumonia vaccine(s):  2019 Shingrix:  Zostavax done  Hep C testing: 08/28/16 neg  Screening Labs: PCP   reports that she has never smoked. She has never used smokeless tobacco. She reports that she does not drink alcohol or use drugs.  Past Medical History:  Diagnosis Date  . Anxiety   . Diabetes mellitus   . Edema of extremities 03/02/2016  . Essential hypertension, benign   . Lazy eye   . Major depressive disorder, single episode, unspecified   . Obesity (BMI 30-39.9) 03/01/2016  . OSA (obstructive sleep apnea)   . Pulmonary HTN (HCC)    mild with PASP by echo 05/2016  . Pure hypercholesterolemia   . Retinopathy, due to hypertension     Past Surgical History:  Procedure Laterality Date  . CPAP TITRATION  10/14/2015  . DILATION AND CURETTAGE OF UTERUS  2005-2008   x2  . EYE SURGERY     cataracts  . WISDOM TOOTH EXTRACTION      Current Outpatient Medications  Medication Sig Dispense Refill  . Alcohol Swabs (ALCOHOL PREP) PADS Pt uses an alcohol pad each time sugars are tested. Pt tests twice daily. Dx E11.9 100 each 3  . amLODipine (NORVASC) 10 MG tablet TAKE 1 TABLET BY MOUTH EVERY DAY 90 tablet 1  . atorvastatin (LIPITOR) 40 MG tablet Take 1 tablet (40 mg total) by mouth daily. 90 tablet 1  . Cholecalciferol  (VITAMIN D PO) Take 2,000 Int'l Units by mouth daily.     . cyclobenzaprine (FLEXERIL) 10 MG tablet Take 1 tablet (10 mg total) by mouth 3 (three) times daily as needed for muscle spasms. 30 tablet 0  . estradiol (ESTRACE) 0.5 MG tablet Take 0.5 tablets (0.25 mg total) by mouth daily. 90 tablet 4  . fish oil-omega-3 fatty acids 1000 MG capsule Take 1 g by mouth daily.    . fluticasone (FLONASE) 50 MCG/ACT nasal spray SPRAY 2 SPRAYS INTO EACH NOSTRIL EVERY DAY 16 g 0  . glipiZIDE (GLUCOTROL XL) 10 MG 24 hr tablet TAKE 1 TABLET BY MOUTH ONCE DAILY WITH BREAKFAST 90 tablet 0  . glucose blood (FREESTYLE LITE) test strip USE TO TEST BLOOD GLUCOSE 2 TIMES DAILY. Dx E11.9 100 each 12  . hydrochlorothiazide (MICROZIDE) 12.5 MG capsule Take 1 capsule (12.5 mg total) by mouth 2 (two) times daily. 180 capsule 1  . loratadine (CLARITIN) 10 MG tablet Take 10 mg by mouth daily.    . meclizine (ANTIVERT) 25 MG tablet Take 1 tablet (25 mg total) by mouth 3 (three) times daily as needed for dizziness. 30 tablet 0  . MELATONIN PO Take 3 mg by mouth  at bedtime.     . metFORMIN (GLUCOPHAGE-XR) 500 MG 24 hr tablet TAKE 2 TABLETS BY MOUTH TWICE DAILY BEFORE A MEAL 360 tablet 0  . Multiple Vitamins-Minerals (VISION FORMULA) TABS Take 1 tablet by mouth every morning.     [name]. OVER THE COUNTER MEDICATION Equate Vision Formula 50+    . pioglitazone (ACTOS) 30 MG tablet TAKE 1 TABLET BY MOUTH EVERY DAY 90 tablet 1  . progesterone (PROMETRIUM) 100 MG capsule Take 1 capsule (100 mg total) by mouth daily. 90 capsule 4  . ramipril (ALTACE) 10 MG capsule TAKE 2 CAPSULES (20 MG TOTAL) BY MOUTH DAILY. 180 capsule 0  . spironolactone (ALDACTONE) 25 MG tablet TAKE 0.5 TABLETS BY MOUTH DAILY. PLEASE MAKE APPT WITH DR. Mayford KnifeURNER FOR MARCH BEFORE ANYMORE REFILLS. 30 tablet 10  . TRUEPLUS LANCETS 30G MISC Use one lancet each time sugars are tested. Pt tests twice daily. Dx. E11.9 100 each 3  . venlafaxine (EFFEXOR) 37.5 MG tablet TAKE 1  TABLET BY MOUTH DAILY. 90 tablet 0   No current facility-administered medications for this visit.     Family History  Adopted: Yes  Problem Relation Age of Onset  . Alcohol abuse Mother   . ADD / ADHD Mother   . Dementia Mother   . Breast cancer Neg Hx     Review of Systems  All other systems reviewed and are negative.   Exam:   BP 138/62 (BP Location: Left Arm, Patient Position: Sitting, Cuff Size: Large)   Pulse 80   Resp 18   Ht 4' 11.5" (1.511 m)   Wt 213 lb (96.6 kg)   LMP 06/25/2008   BMI 42.30 kg/m     Height: 4' 11.5" (151.1 cm)  Ht Readings from Last 3 Encounters:  11/22/17 4' 11.5" (1.511 m)  10/21/17 5' (1.524 m)  09/23/17 5' (1.524 m)    General appearance: alert, cooperative and appears stated age Head: Normocephalic, without obvious abnormality, atraumatic Neck: no adenopathy, supple, symmetrical, trachea midline and thyroid normal to inspection and palpation Lungs: clear to auscultation bilaterally Breasts: normal appearance, no masses or tenderness Heart: regular rate and rhythm Abdomen: soft, non-tender; bowel sounds normal; no masses,  no organomegaly Extremities: extremities normal, atraumatic, no cyanosis or edema Skin: Skin color, texture, turgor normal. No rashes or lesions Lymph nodes: Cervical, supraclavicular, and axillary nodes normal. No abnormal inguinal nodes palpated Neurologic: Grossly normal   Pelvic: External genitalia:  no lesions              Urethra:  normal appearing urethra with no masses, tenderness or lesions              Bartholins and Skenes: normal                 Vagina: normal appearing vagina with normal color and discharge, no lesions              Cervix: no lesions              Pap taken: No. Bimanual Exam:  Uterus:  normal size, contour, position, consistency, mobility, non-tender              Adnexa: normal adnexa and no mass, fullness, tenderness               Rectovaginal: Confirms               Anus:  normal  sphincter tone, no lesions  Chaperone was present for exam.  A:  Well Woman with normal exam PMP, weaning off HRT ADHD Type 2 diabetes Hypertension Elevated lipids Osteopenia H/o pulmonary hypertension due OSA, on CPAP  P:   Mammogram guidelines reviewed Pap smear not obtained as not indicated.  Guidelines reviewed.  She desires to continue pap smears going forward.   Will stop HRT today.  Knows to call me with any issues. Lab work UTD with Dr. Beverely Low Reviewed vaccinations with pt.  Pt will plan to return in two years.

## 2017-12-03 ENCOUNTER — Other Ambulatory Visit: Payer: Self-pay | Admitting: Physician Assistant

## 2017-12-03 DIAGNOSIS — B9789 Other viral agents as the cause of diseases classified elsewhere: Secondary | ICD-10-CM

## 2017-12-03 DIAGNOSIS — J329 Chronic sinusitis, unspecified: Principal | ICD-10-CM

## 2017-12-28 ENCOUNTER — Other Ambulatory Visit: Payer: Self-pay | Admitting: Family Medicine

## 2017-12-30 ENCOUNTER — Other Ambulatory Visit: Payer: Self-pay | Admitting: Family Medicine

## 2018-01-12 ENCOUNTER — Other Ambulatory Visit: Payer: Self-pay | Admitting: Physician Assistant

## 2018-01-12 DIAGNOSIS — B9789 Other viral agents as the cause of diseases classified elsewhere: Secondary | ICD-10-CM

## 2018-01-12 DIAGNOSIS — J329 Chronic sinusitis, unspecified: Principal | ICD-10-CM

## 2018-01-29 ENCOUNTER — Encounter: Payer: Self-pay | Admitting: Cardiology

## 2018-01-29 ENCOUNTER — Ambulatory Visit (INDEPENDENT_AMBULATORY_CARE_PROVIDER_SITE_OTHER): Payer: Medicare Other | Admitting: Cardiology

## 2018-01-29 VITALS — BP 144/60 | HR 59 | Ht 59.5 in | Wt 213.4 lb

## 2018-01-29 DIAGNOSIS — I272 Pulmonary hypertension, unspecified: Secondary | ICD-10-CM | POA: Diagnosis not present

## 2018-01-29 NOTE — Patient Instructions (Signed)
Medication Instructions:   Your physician recommends that you continue on your current medications as directed. Please refer to the Current Medication list given to you today.   If you need a refill on your cardiac medications before your next appointment, please call your pharmacy.  Labwork: NONE ORDERED  TODAY    Testing/Procedures: NONE ORDERED  TODAY    Follow-Up:  Your physician wants you to follow-up in:  IN 6  MONTHS WITH DR TURNER   You will receive a reminder letter in the mail two months in advance. If you don't receive a letter, please call our office to schedule the follow-up appointment.      Any Other Special Instructions Will Be Listed Below (If Applicable).                                                                                                                                                   

## 2018-01-29 NOTE — Progress Notes (Signed)
01/29/2018 Kayla Brown   [birth year]  [id number]  Primary Physician Sheliah Hatch, MD Primary Cardiologist: Dr. Mayford Knife  Electrophysiologist: N/A  Reason for Visit/CC: 6 Month F/u for Pulmonary HTN and Essential HTN  HPI:  Kayla Brown is a 67 y.o. female who is being seen today for routien f/u. She is followed by Dr. Mayford Knife and has a h/o OSA,HTN, DM, Obesity, moderate pulmonary HTN (PASP by echo 02/2015)and dyslipidemia. She hassevere OSA but is compliant with CPAP.   Dr. Mayford Knife recently ordered a f/u 2D echo 08/14/17 which showed improvement in PA pressure, down from 43 mmgHg previously to 33 mmHg. LVEF also normal at 60-65%. No WMA. She was noted to have mild MR, TR and mild AR.   She is here today with her husband, who also see's Dr. Mayford Knife. She notes she has been doing well. She denies any new symptoms. She tries to stay active. She walks, swims and rides the exercise bike for exercise. She has stable chronic exertional dyspnea with moderate to heavy physical activity, but no change in baseline. No significant dyspnea with ADLs. She denies CP. She reports full compliance with CPAP. Her BP is 144/60 this morning, but this is prior to medications. She is on HCTZ, amlodipine, Altace and spironolactone. Her PCP follows labs. BMP in April showed normal SCr and K. Lipid panel also showed controlled LDL at 55 mg/dL. TG however were elevated at 228. Hgb A1c was mildly elevated at 7.7. She is scheduled to go back to PCP next week for repeat labs. She has been trying to get her DM better controlled.    Current Meds  Medication Sig  . Alcohol Swabs (ALCOHOL PREP) PADS Pt uses an alcohol pad each time sugars are tested. Pt tests twice daily. Dx E11.9  . amLODipine (NORVASC) 10 MG tablet TAKE 1 TABLET BY MOUTH EVERY DAY  . atorvastatin (LIPITOR) 40 MG tablet TAKE 1 TABLET BY MOUTH EVERY DAY  . Cholecalciferol (VITAMIN D PO) Take 2,000 Int'l Units by mouth daily.   . fish oil-omega-3  fatty acids 1000 MG capsule Take 1 g by mouth daily.  . fluticasone (FLONASE) 50 MCG/ACT nasal spray SPRAY 2 SPRAYS INTO EACH NOSTRIL EVERY DAY  . glipiZIDE (GLUCOTROL XL) 10 MG 24 hr tablet TAKE 1 TABLET BY MOUTH ONCE DAILY WITH BREAKFAST  . glucose blood (FREESTYLE LITE) test strip USE TO TEST BLOOD GLUCOSE 2 TIMES DAILY. Dx E11.9  . hydrochlorothiazide (MICROZIDE) 12.5 MG capsule TAKE 1 CAPSULE (12.5 MG TOTAL) BY MOUTH 2 (TWO) TIMES DAILY.  [name] loratadine (CLARITIN) 10 MG tablet TAKE 1 TABLET BY MOUTH EVERY DAY  . meclizine (ANTIVERT) 25 MG tablet Take 1 tablet (25 mg total) by mouth 3 (three) times daily as needed for dizziness.  [name] MELATONIN PO Take 3 mg by mouth at bedtime.   . metFORMIN (GLUCOPHAGE-XR) 500 MG 24 hr tablet TAKE 2 TABLETS BY MOUTH TWICE DAILY BEFORE A MEAL  . Multiple Vitamins-Minerals (VISION FORMULA) TABS Take 1 tablet by mouth every morning.   [name] OVER THE COUNTER MEDICATION Equate Vision Formula 50+  . pioglitazone (ACTOS) 30 MG tablet TAKE 1 TABLET BY MOUTH EVERY DAY  . ramipril (ALTACE) 10 MG capsule TAKE 2 CAPSULES (20 MG TOTAL) BY MOUTH DAILY.  [name] spironolactone (ALDACTONE) 25 MG tablet TAKE 0.5 TABLETS BY MOUTH DAILY. PLEASE MAKE APPT WITH DR. Mayford Knife FOR MARCH BEFORE ANYMORE REFILLS.  [name] TRUEPLUS LANCETS 30G MISC Use one lancet each time sugars are tested. Pt tests  twice daily. Dx. E11.9  . [DISCONTINUED] venlafaxine (EFFEXOR) 37.5 MG tablet TAKE 1 TABLET BY MOUTH DAILY.   No Known Allergies Past Medical History:  Diagnosis Date  . Anxiety   . Diabetes mellitus   . Edema of extremities 03/02/2016  . Essential hypertension, benign   . Lazy eye   . Major depressive disorder, single episode, unspecified   . Obesity (BMI 30-39.9) 03/01/2016  . OSA (obstructive sleep apnea)   . Pulmonary HTN (HCC)    mild with PASP 40mmHg by echo 05/2016  . Pure hypercholesterolemia   . Retinopathy, due to hypertension    Family History  Adopted: Yes  Problem Relation Age of Onset    . Alcohol abuse Mother   . ADD / ADHD Mother   . Dementia Mother   . Breast cancer Neg Hx    Past Surgical History:  Procedure Laterality Date  . CPAP TITRATION  10/14/2015  . DILATION AND CURETTAGE OF UTERUS  2005-2008   x2  . EYE SURGERY     cataracts  . WISDOM TOOTH EXTRACTION     Social History   Socioeconomic History  . Marital status: Married    Spouse name: Not on file  . Number of children: Not on file  . Years of education: Not on file  . Highest education level: Not on file  Occupational History  . Not on file  Social Needs  . Financial resource strain: Not on file  . Food insecurity:    Worry: Not on file    Inability: Not on file  . Transportation needs:    Medical: Not on file    Non-medical: Not on file  Tobacco Use  . Smoking status: Never Smoker  . Smokeless tobacco: Never Used  Substance and Sexual Activity  . Alcohol use: No    Alcohol/week: 0.0 oz  . Drug use: No  . Sexual activity: Yes    Partners: Male    Birth control/protection: None  Lifestyle  . Physical activity:    Days per week: Not on file    Minutes per session: Not on file  . Stress: Not on file  Relationships  . Social connections:    Talks on phone: Not on file    Gets together: Not on file    Attends religious service: Not on file    Active member of club or organization: Not on file    Attends meetings of clubs or organizations: Not on file    Relationship status: Not on file  . Intimate partner violence:    Fear of current or ex partner: Not on file    Emotionally abused: Not on file    Physically abused: Not on file    Forced sexual activity: Not on file  Other Topics Concern  . Not on file  Social History Narrative  . Not on file     Review of Systems: General: negative for chills, fever, night sweats or weight changes.  Cardiovascular: negative for chest pain, dyspnea on exertion, edema, orthopnea, palpitations, paroxysmal nocturnal dyspnea or shortness of  breath Dermatological: negative for rash Respiratory: negative for cough or wheezing Urologic: negative for hematuria Abdominal: negative for nausea, vomiting, diarrhea, bright red blood per rectum, melena, or hematemesis Neurologic: negative for visual changes, syncope, or dizziness All other systems reviewed and are otherwise negative except as noted above.   Physical Exam:  Blood pressure (!) 144/60, pulse (!) 59, height 4' 11.5" (1.511 m), weight 213 lb 6.4 oz (  96.8 kg), last menstrual period 06/25/2008, SpO2 98 %.  General appearance: alert, cooperative and no distress, obese  Neck: no carotid bruit and no JVD Lungs: clear to auscultation bilaterally Heart: regular rate and rhythm, S1, S2 normal, no murmur, click, rub or gallop Extremities: trace LEE bilaterally, R>L Pulses: 2+ and symmetric Skin: Skin color, texture, turgor normal. No rashes or lesions Neurologic: Grossly normal  EKG not peroformed -- personally reviewed   ASSESSMENT AND PLAN:   1. H/o Pulmonary HTN: recent echo 07/2017 showed improvement in PA pressure, down from 43 mmHg previously to now 33 mmHg. Pt notes better exercise tolerance. She tries to stay active, several days a week.  She walks, swims and rides the exercise bike for exercise. She has stable chronic exertional dyspnea with moderate to heavy physical activity, but no change in baseline. No significant dyspnea w/ ADLs. Pt encouraged to continue with CPAP nightly and to continue to exercise for weight loss.   2. Essential HTN: fairly controlled. 144/60 today, however pt has not yet taken her AM medications. She is scheduled to take  HCTZ, amlodipine, Altace and spironolactone. She plans to take once she returns home.   Her PCP follows labs. BMP in April showed normal SCr and K.  3. OSA: she reports full compliance with CPAP. Pt instructed to continue.   4. DLD:  recently checked by PCP in April. LDL 55 mg/dL. TG elevated at 228. Hepatic function normal.  We discussed maintaining a low fat diet and continuation of regular exercise for weight loss. Continue on fish oil.   5. Mild MR/TR and AR: mild valvular disease noted on recent echo 07/2017.   6. Obesity: pt has increased physical activity. She walks daily for exercise. Also has added swimming and biking to regimen.  She was encouraged to continue to live an active lifestyle.     Follow-Up w/ Dr. Mayford Knife in 6 Months.   Kayla Brown, MHS [facility] 01/29/2018 8:00 AM

## 2018-01-31 ENCOUNTER — Other Ambulatory Visit: Payer: Self-pay | Admitting: Family Medicine

## 2018-02-05 ENCOUNTER — Other Ambulatory Visit: Payer: Self-pay

## 2018-02-05 ENCOUNTER — Ambulatory Visit (INDEPENDENT_AMBULATORY_CARE_PROVIDER_SITE_OTHER): Payer: Medicare Other | Admitting: Family Medicine

## 2018-02-05 ENCOUNTER — Encounter: Payer: Self-pay | Admitting: Family Medicine

## 2018-02-05 VITALS — BP 121/68 | HR 74 | Temp 98.1°F | Resp 16 | Ht 60.0 in | Wt 215.0 lb

## 2018-02-05 DIAGNOSIS — E119 Type 2 diabetes mellitus without complications: Secondary | ICD-10-CM

## 2018-02-05 DIAGNOSIS — T162XXA Foreign body in left ear, initial encounter: Secondary | ICD-10-CM

## 2018-02-05 NOTE — Assessment & Plan Note (Signed)
Chronic problem.  UTD on foot exam, eye exam.  On ACE for renal protection.  Hx of adequate control.  Applauded her efforts at regular exercise and low carb diet.  Check labs.  Adjust meds prn

## 2018-02-05 NOTE — Patient Instructions (Signed)
Schedule your Medicare Wellness Visit w/ me in 3-4 months We'll notify you of your lab results and make any changes if needed Keep up the good work on healthy diet and regular exercise- you are doing great! We'll let you know about your ENT appt Call with any questions or concerns Enjoy the rest of your summer!

## 2018-02-05 NOTE — Progress Notes (Signed)
   Subjective:    Patient ID: Kayla Brown, female    DOB: 03/19/1951, 67 y.o.   MRN: [id number]  HPI DM- chronic problem, on Glipizide 10mg  daily, Metformin XR 1000mg  BID, Actos 30mg  daily.  Last A1C 7.7.  UTD on eye exam, foot exam.  On ACE for renal protection.  Swimming 3-4x/week and walking on treadmill.  'i'm enjoying exercise now'.  No CP, SOB, HAs, visual changes, numbness/tingling of hands/feet.  Denies symptomatic lows.  L ear pain- pt fears that she has swimmers ear due to her increased water activities.  No drainage.  No fevers.  Review of Systems For ROS see HPI     Objective:   Physical Exam  Constitutional: She is oriented to person, place, and time. She appears well-developed and well-nourished. No distress.  obese  HENT:  Head: Normocephalic and atraumatic.  R TM WNL L TM obscured by black plastic foreign body that appears to be part of her hearing aid.  Flange shaped and filling almost entire ear canal- unable to grasp w/ alligator forceps and irrigation was unsuccessful  Eyes: Pupils are equal, round, and reactive to light. Conjunctivae and EOM are normal.  Neck: Normal range of motion. Neck supple. No thyromegaly present.  Cardiovascular: Normal rate, regular rhythm, normal heart sounds and intact distal pulses.  No murmur heard. Pulmonary/Chest: Effort normal and breath sounds normal. No respiratory distress.  Abdominal: Soft. She exhibits no distension. There is no tenderness.  Musculoskeletal: She exhibits no edema.  Lymphadenopathy:    She has no cervical adenopathy.  Neurological: She is alert and oriented to person, place, and time.  Skin: Skin is warm and dry.  Psychiatric: She has a normal mood and affect. Her behavior is normal.  Vitals reviewed.         Assessment & Plan:  L ear foreign body- this appears to be part of her hearing aide.  Pt says she lost a piece ~4 weeks ago.  This was unable to be removed in office.  Will place urgent referral to  ENT for removal.  Pt expressed understanding and is in agreement w/ plan.

## 2018-02-06 ENCOUNTER — Other Ambulatory Visit: Payer: Self-pay | Admitting: Family Medicine

## 2018-02-06 ENCOUNTER — Other Ambulatory Visit: Payer: Self-pay | Admitting: Physician Assistant

## 2018-02-06 ENCOUNTER — Encounter: Payer: Self-pay | Admitting: General Practice

## 2018-02-06 DIAGNOSIS — T162XXA Foreign body in left ear, initial encounter: Secondary | ICD-10-CM | POA: Diagnosis not present

## 2018-02-06 DIAGNOSIS — B9789 Other viral agents as the cause of diseases classified elsewhere: Secondary | ICD-10-CM

## 2018-02-06 DIAGNOSIS — J329 Chronic sinusitis, unspecified: Principal | ICD-10-CM

## 2018-02-06 LAB — HEMOGLOBIN A1C: Hgb A1c MFr Bld: 7.5 % — ABNORMAL HIGH (ref 4.6–6.5)

## 2018-02-06 LAB — BASIC METABOLIC PANEL
BUN: 40 mg/dL — ABNORMAL HIGH (ref 6–23)
CO2: 23 mEq/L (ref 19–32)
Calcium: 10.1 mg/dL (ref 8.4–10.5)
Chloride: 103 mEq/L (ref 96–112)
Creatinine, Ser: 1.15 mg/dL (ref 0.40–1.20)
GFR: 50.2 mL/min — ABNORMAL LOW (ref >60.00)
Glucose, Bld: 126 mg/dL — ABNORMAL HIGH (ref 70–99)
Potassium: 4.8 mEq/L (ref 3.5–5.1)
Sodium: 137 mEq/L (ref 135–145)

## 2018-02-06 NOTE — Telephone Encounter (Signed)
Will defer further refills of patient's medications to PCP  

## 2018-03-20 ENCOUNTER — Ambulatory Visit (INDEPENDENT_AMBULATORY_CARE_PROVIDER_SITE_OTHER): Payer: Medicare Other | Admitting: General Practice

## 2018-03-20 DIAGNOSIS — Z23 Encounter for immunization: Secondary | ICD-10-CM | POA: Diagnosis not present

## 2018-03-20 NOTE — Progress Notes (Signed)
Kayla Brown is a 67 y.o. female presents to the office today for influenza injections, per physician's orders. Original order: 03/20/18 Fluarix (med), 0.28mL (dose),  IM (route) was administered 03/20/18 (location) today. Patient tolerated injection.  Jessica L Brodmerkel

## 2018-03-21 ENCOUNTER — Ambulatory Visit: Payer: Medicare Other

## 2018-03-26 ENCOUNTER — Other Ambulatory Visit: Payer: Self-pay | Admitting: Family Medicine

## 2018-03-26 DIAGNOSIS — B9789 Other viral agents as the cause of diseases classified elsewhere: Secondary | ICD-10-CM

## 2018-03-26 DIAGNOSIS — J329 Chronic sinusitis, unspecified: Principal | ICD-10-CM

## 2018-03-27 ENCOUNTER — Other Ambulatory Visit: Payer: Self-pay | Admitting: Family Medicine

## 2018-04-01 ENCOUNTER — Ambulatory Visit: Payer: Self-pay

## 2018-04-01 ENCOUNTER — Encounter: Payer: Self-pay | Admitting: Physician Assistant

## 2018-04-01 ENCOUNTER — Ambulatory Visit (INDEPENDENT_AMBULATORY_CARE_PROVIDER_SITE_OTHER): Payer: Medicare Other | Admitting: Physician Assistant

## 2018-04-01 ENCOUNTER — Other Ambulatory Visit: Payer: Self-pay

## 2018-04-01 VITALS — BP 142/68 | HR 64 | Temp 97.8°F | Resp 16 | Ht 60.0 in | Wt 211.0 lb

## 2018-04-01 DIAGNOSIS — E119 Type 2 diabetes mellitus without complications: Secondary | ICD-10-CM | POA: Diagnosis not present

## 2018-04-01 DIAGNOSIS — G4700 Insomnia, unspecified: Secondary | ICD-10-CM

## 2018-04-01 MED ORDER — SUVOREXANT 10 MG PO TABS: 10.0000 mg | ORAL_TABLET | Freq: Every day | ORAL | 1 refills | Status: DC

## 2018-04-01 NOTE — Telephone Encounter (Signed)
Pt called with C/O high blood sugar.  She stated that in the morning fasting she was 115 and 2 hours later she was 267 She went to exercise and after her BS was 256. Pt states that she has taken all her medication for her BS and she is worried about it being high. She takes glipizide and metformin. She feels stress in her life may be triggering her high BS. She stated that she hasn't eaten but a few carrots today. Call placed to flow coordinator who spoke with Dr Beverely Low. Per Dr Beverely Low ok to schedule her to be seen in the office today with another provider. Pt advised to eat something per DR Carolin Sicks. Care advice read to patient. Pt verbalized understanding.  Reason for Disposition . [1] Blood glucose > 240 mg/dL (16.1 mmol/L) AND [0] vomiting AND [3] unable to check for ketones (in blood or urine)    Per Dr Beverely Low ok for office visit  Answer Assessment - Initial Assessment Questions 1. BLOOD GLUCOSE: "What is your blood glucose level?"      115 fasting in am, 2 hour later 267 now 256 2. ONSET: "When did you check the blood glucose?"     A few minutes ago 3. USUAL RANGE: "What is your glucose level usually?" (e.g., usual fasting morning value, usual evening value)     Below 200 4. KETONES: "Do you check for ketones (urine or blood test strips)?" If yes, ask: "What does the test show now?"      no 5. TYPE 1 or 2:  "Do you know what type of diabetes you have?"  (e.g., Type 1, Type 2, Gestational; doesn't know)      Type 2 6. INSULIN: "Do you take insulin?" "What type of insulin(s) do you use? What is the mode of delivery? (syringe, pen (e.g., injection or  pump)?"      no 7. DIABETES PILLS: "Do you take any pills for your diabetes?" If yes, ask: "Have you missed taking any pills recently?"     glipazide and metformin 8. OTHER SYMPTOMS: "Do you have any symptoms?" (e.g., fever, frequent urination, difficulty breathing, dizziness, weakness, vomiting)     Hard to say taking fluid pill 9. PREGNANCY:  "Is there any chance you are pregnant?" "When was your last menstrual period?"     N/A  Protocols used: DIABETES - HIGH BLOOD SUGAR-A-AH

## 2018-04-01 NOTE — Progress Notes (Signed)
Patient presents to clinic today noting change in glucose levels that is concerning her. Notes checking fasting glucose this morning and noting it to be 115. States she ate breakfast per usual having some sausage and eggs. States she went to exercise (walking on treadmill) and then rechecked glucose, noting it to be in the 250s. States she drank some water and avoiding lunch. Rechecked a while ago and noted glucose to be in the 270s. Notes she is asymptomatic. Did take her Metformin, Actos and SU today. Again has not eaten since this morning.  Lab Results  Component Value Date   HGBA1C 7.5 (H) 02/05/2018   Patient also notes issue with falling asleep at night. Notes she has been worrying about husbands health and finances which make it hard to go asleep and stay asleep. Is taking Melatonin but does not feel it helps. Denies daytime anxiety, depressed mood or anhedonia.  Past Medical History:  Diagnosis Date  . Anxiety   . Diabetes mellitus   . Edema of extremities 03/02/2016  . Essential hypertension, benign   . Lazy eye   . Major depressive disorder, single episode, unspecified   . Obesity (BMI 30-39.9) 03/01/2016  . OSA (obstructive sleep apnea)   . Pulmonary HTN (HCC)    mild with PASP by echo 05/2016  . Pure hypercholesterolemia   . Retinopathy, due to hypertension     Current Outpatient Medications on File Prior to Visit  Medication Sig Dispense Refill  . Alcohol Swabs (ALCOHOL PREP) PADS Pt uses an alcohol pad each time sugars are tested. Pt tests twice daily. Dx E11.9 100 each 3  . amLODipine (NORVASC) 10 MG tablet TAKE 1 TABLET BY MOUTH EVERY DAY 90 tablet 1  . atorvastatin (LIPITOR) 40 MG tablet TAKE 1 TABLET BY MOUTH EVERY DAY 90 tablet 1  . Cholecalciferol (VITAMIN D PO) Take 2,000 Int'l Units by mouth daily.     . fish oil-omega-3 fatty acids 1000 MG capsule Take 1 g by mouth daily.    . fluticasone (FLONASE) 50 MCG/ACT nasal spray SPRAY 2 SPRAYS INTO EACH NOSTRIL  EVERY DAY 18 g 1  . glipiZIDE (GLUCOTROL XL) 10 MG 24 hr tablet TAKE 1 TABLET BY MOUTH ONCE DAILY WITH BREAKFAST 90 tablet 0  . glucose blood (FREESTYLE LITE) test strip USE TO TEST BLOOD GLUCOSE 2 TIMES DAILY. Dx E11.9 100 each 12  . hydrochlorothiazide (MICROZIDE) 12.5 MG capsule TAKE 1 CAPSULE (12.5 MG TOTAL) BY MOUTH 2 (TWO) TIMES DAILY. 180 capsule 1  . loratadine (CLARITIN) 10 MG tablet TAKE 1 TABLET BY MOUTH EVERY DAY 30 tablet 11  . meclizine (ANTIVERT) 25 MG tablet Take 1 tablet (25 mg total) by mouth 3 (three) times daily as needed for dizziness. 30 tablet 0  . MELATONIN PO Take 3 mg by mouth at bedtime.     . metFORMIN (GLUCOPHAGE-XR) 500 MG 24 hr tablet TAKE 2 TABLETS BY MOUTH TWICE DAILY BEFORE A MEAL 360 tablet 0  . Multiple Vitamins-Minerals (VISION FORMULA) TABS Take 1 tablet by mouth every morning.     [name] OVER THE COUNTER MEDICATION Equate Vision Formula 50+    . pioglitazone (ACTOS) 30 MG tablet TAKE 1 TABLET BY MOUTH EVERY DAY 90 tablet 1  . ramipril (ALTACE) 10 MG capsule TAKE 2 CAPSULES (20 MG TOTAL) BY MOUTH DAILY. 180 capsule 0  . spironolactone (ALDACTONE) 25 MG tablet TAKE 0.5 TABLETS BY MOUTH DAILY. PLEASE MAKE APPT WITH DR. Mayford Knife FOR MARCH BEFORE ANYMORE REFILLS.  30 tablet 10  . TRUEPLUS LANCETS 30G MISC Use one lancet each time sugars are tested. Pt tests twice daily. Dx. E11.9 100 each 3   No current facility-administered medications on file prior to visit.     No Known Allergies  Family History  Adopted: Yes  Problem Relation Age of Onset  . Alcohol abuse Mother   . ADD / ADHD Mother   . Dementia Mother   . Breast cancer Neg Hx     Social History   Socioeconomic History  . Marital status: Married    Spouse name: Not on file  . Number of children: Not on file  . Years of education: Not on file  . Highest education level: Not on file  Occupational History  . Not on file  Social Needs  . Financial resource strain: Not on file  . Food insecurity:     Worry: Not on file    Inability: Not on file  . Transportation needs:    Medical: Not on file    Non-medical: Not on file  Tobacco Use  . Smoking status: Never Smoker  . Smokeless tobacco: Never Used  Substance and Sexual Activity  . Alcohol use: No    Alcohol/week: 0.0 standard drinks  . Drug use: No  . Sexual activity: Yes    Partners: Male    Birth control/protection: None  Lifestyle  . Physical activity:    Days per week: Not on file    Minutes per session: Not on file  . Stress: Not on file  Relationships  . Social connections:    Talks on phone: Not on file    Gets together: Not on file    Attends religious service: Not on file    Active member of club or organization: Not on file    Attends meetings of clubs or organizations: Not on file    Relationship status: Not on file  Other Topics Concern  . Not on file  Social History Narrative  . Not on file   Review of Systems - See HPI.  All other ROS are negative.  BP (!) 142/68   Pulse 64   Temp 97.8 F (36.6 C) (Oral)   Resp 16   Ht 5' (1.524 m)   Wt 211 lb (95.7 kg)   LMP 06/25/2008   SpO2 98%   BMI 41.21 kg/m   Physical Exam  Constitutional: She is oriented to person, place, and time. She appears well-developed and well-nourished.  HENT:  Head: Normocephalic and atraumatic.  Cardiovascular: Normal rate, regular rhythm, normal heart sounds and intact distal pulses.  Pulmonary/Chest: Effort normal and breath sounds normal. No stridor. No respiratory distress. She has no wheezes. She has no rales. She exhibits no tenderness.  Neurological: She is alert and oriented to person, place, and time.  Psychiatric: Her speech is normal and behavior is normal. Thought content normal. Her mood appears anxious.  Vitals reviewed.  Recent Results (from the past 2160 hour(s))  Hemoglobin A1c     Status: Abnormal   Collection Time: 02/05/18  3:17 PM  Result Value Ref Range   Hgb A1c MFr Bld 7.5 (H) 4.6 - 6.5 %     Comment: Glycemic Control Guidelines for People with Diabetes:Non Diabetic:  <6%Goal of Therapy: <7%Additional Action Suggested:  >8%   Basic metabolic panel     Status: Abnormal   Collection Time: 02/05/18  3:17 PM  Result Value Ref Range   Sodium 137 135 - 145 mEq/L  Potassium 4.8 3.5 - 5.1 mEq/L   Chloride 103 96 - 112 mEq/L   CO2 23 19 - 32 mEq/L   Glucose, Bld 126 (H) 70 - 99 mg/dL   BUN 40 (H) 6 - 23 mg/dL   Creatinine, Ser 4.09 0.40 - 1.20 mg/dL   Calcium 81.1 8.4 - 91.4 mg/dL   GFR 78.29 (L) >56.21 mL/min    Assessment/Plan: 1. Type 2 diabetes mellitus without complication, unspecified whether long term insulin use (HCC) Had elevated glucose measurements today on her home machine. 260s. Asymptomatic. Has not eating anything since early AM. Glucose currently at 74. Her machine is likely not accurate. Needs to eat today. Has a newer meter at home which she has been encouraged to use tonight and tomorrow morning to check glucose. Will call tomorrow morning and make further changes if needed.   2. Insomnia, unspecified type Will attempt trial of Belsomra 10 mg for sleep. Will see how this helps with anxiety during the day. Follow-up with PCP scheduled.  - Suvorexant (BELSOMRA) 10 MG TABS; Take 10 mg by mouth at bedtime.  Dispense: 30 tablet; Refill: 1   Piedad Climes, [place]

## 2018-04-01 NOTE — Patient Instructions (Signed)
Please keep well-hydrated. Eat something when you leave here! Your glucometer is not accurate and is making you thing sugars are high when they are normal! You cannot take the Glucotrol and not eat cause there is a high risk for hypoglycemia.  Use your new meter tonight to check sugar before dinner and before bed. Check tomorrow morning as well. I will be calling you to review these numbers.  The Belsorma is to help with sleep. Follow-up with me in 1 week for reassessment.

## 2018-04-01 NOTE — Telephone Encounter (Signed)
FYI

## 2018-04-04 ENCOUNTER — Ambulatory Visit (INDEPENDENT_AMBULATORY_CARE_PROVIDER_SITE_OTHER): Payer: Medicare Other | Admitting: Family Medicine

## 2018-04-04 ENCOUNTER — Other Ambulatory Visit: Payer: Self-pay

## 2018-04-04 ENCOUNTER — Encounter: Payer: Self-pay | Admitting: Family Medicine

## 2018-04-04 VITALS — BP 138/62 | HR 58 | Temp 97.7°F | Resp 16 | Ht 60.0 in | Wt 211.0 lb

## 2018-04-04 DIAGNOSIS — F419 Anxiety disorder, unspecified: Secondary | ICD-10-CM | POA: Diagnosis not present

## 2018-04-04 DIAGNOSIS — E119 Type 2 diabetes mellitus without complications: Secondary | ICD-10-CM

## 2018-04-04 DIAGNOSIS — F329 Major depressive disorder, single episode, unspecified: Secondary | ICD-10-CM

## 2018-04-04 MED ORDER — SERTRALINE HCL 25 MG PO TABS: 25.0000 mg | ORAL_TABLET | Freq: Every day | ORAL | 3 refills | Status: DC

## 2018-04-04 NOTE — Patient Instructions (Signed)
Follow up in 3-4 weeks to recheck mood START the Sertraline 25mg  daily to improve anxiety STOP checking your sugars- this is stressing you out Call with any questions or concerns Hang in there!!!

## 2018-04-04 NOTE — Assessment & Plan Note (Signed)
Deteriorated.  Pt is very anxious, tearful, and unable to calm down during visit.  Had long discussion with her about the nature of anxiety/depression and that this is not a character flaw but a medical diagnosis.  Spent >30 minutes counseling pt and husband on dx and treatment options.  Start low dose sertraline and monitor closely.  Pt expressed understanding and is in agreement w/ plan.

## 2018-04-04 NOTE — Assessment & Plan Note (Signed)
Sugars have been difficult to control but she has been very stressed out.  Also some question as to whether her meter is accurate.  Given her high anxiety, instructed her to stop checking her sugars until instructed otherwise.  Pt and husband expressed understanding.

## 2018-04-04 NOTE — Progress Notes (Signed)
   Subjective:    Patient ID: Kayla Brown, female    DOB: Dec 08, 1950, 67 y.o.   MRN: [id number]  HPI DM- chronic problem, on Glipizide XL 10mg  daily, Metformin 1000mg  BID, Actos 30mg  daily.  'I can't get my sugars like I want them'.  'i'm having trouble coping w/ things'- 'everything just kinda gets to me'.  'i'm on edge all the time'.  'I can't relax'.  Frequent crying.   Review of Systems For ROS see HPI     Objective:   Physical Exam  Constitutional: She is oriented to person, place, and time. She appears well-developed and well-nourished.  Tearful, very anxious.  HENT:  Head: Normocephalic and atraumatic.  Neurological: She is alert and oriented to person, place, and time.  Skin: Skin is warm and dry.  Psychiatric: Her behavior is normal.  Tearful, very anxious Tangential thought process  Vitals reviewed.         Assessment & Plan:

## 2018-04-07 ENCOUNTER — Ambulatory Visit: Payer: Medicare Other | Admitting: Internal Medicine

## 2018-04-09 ENCOUNTER — Ambulatory Visit: Payer: Medicare Other | Admitting: Family Medicine

## 2018-04-26 ENCOUNTER — Other Ambulatory Visit: Payer: Self-pay | Admitting: Family Medicine

## 2018-04-30 ENCOUNTER — Other Ambulatory Visit: Payer: Self-pay | Admitting: Family Medicine

## 2018-05-05 ENCOUNTER — Encounter: Payer: Self-pay | Admitting: Family Medicine

## 2018-05-05 ENCOUNTER — Other Ambulatory Visit: Payer: Self-pay

## 2018-05-05 ENCOUNTER — Ambulatory Visit (INDEPENDENT_AMBULATORY_CARE_PROVIDER_SITE_OTHER): Payer: Medicare Other | Admitting: Family Medicine

## 2018-05-05 VITALS — BP 130/68 | HR 65 | Temp 98.0°F | Resp 16 | Ht 60.0 in | Wt 218.1 lb

## 2018-05-05 DIAGNOSIS — I10 Essential (primary) hypertension: Secondary | ICD-10-CM

## 2018-05-05 DIAGNOSIS — F419 Anxiety disorder, unspecified: Secondary | ICD-10-CM | POA: Diagnosis not present

## 2018-05-05 DIAGNOSIS — F329 Major depressive disorder, single episode, unspecified: Secondary | ICD-10-CM

## 2018-05-05 MED ORDER — SERTRALINE HCL 50 MG PO TABS: 50.0000 mg | ORAL_TABLET | Freq: Every day | ORAL | 3 refills | Status: DC

## 2018-05-05 NOTE — Progress Notes (Signed)
   Subjective:    Patient ID: Kayla Brown, female    DOB: April 07, 1951, 67 y.o.   MRN: [id number]  HPI Anxiety/depression- started on Sertraline 25mg  daily 1 month ago.  Husband notices a difference in mood and anxiety.  Pt is interested in increasing dose to 50mg  daily.  Pt is sleeping better at night.  HTN- chronic problem, pharmacist upset her by asking if she was aware that HCTZ and Spironolactone should not be used in combination.  Review of Systems For ROS see HPI     Objective:   Physical Exam  Constitutional: She is oriented to person, place, and time. She appears well-developed and well-nourished. No distress.  obese  HENT:  Head: Normocephalic and atraumatic.  Neurological: She is alert and oriented to person, place, and time.  Skin: Skin is warm and dry.  Psychiatric: She has a normal mood and affect. Her behavior is normal. Thought content normal.  Vitals reviewed.         Assessment & Plan:

## 2018-05-05 NOTE — Patient Instructions (Addendum)
Follow up in 4-6 weeks to recheck mood Increase the Sertraline to 50mg  daily- 2 of what you have at home and 1 of the new prescription DO NOT WORRY about the blood pressure medications- you are totally safe! Call with any questions or concerns Happy Thanksgiving!!!

## 2018-05-27 ENCOUNTER — Other Ambulatory Visit: Payer: Self-pay | Admitting: Family Medicine

## 2018-06-02 NOTE — Assessment & Plan Note (Signed)
Improved since starting on Sertraline.  Pt is interested in increasing for further mood improvement.  New prescription provided.  Will follow.

## 2018-06-02 NOTE — Assessment & Plan Note (Signed)
Reassured pt that her current BP regimen was safe and that we regularly monitor her electrolytes to ensure all is well.  This made pt feel better.

## 2018-06-04 ENCOUNTER — Other Ambulatory Visit: Payer: Self-pay

## 2018-06-04 ENCOUNTER — Encounter: Payer: Self-pay | Admitting: Family Medicine

## 2018-06-04 ENCOUNTER — Ambulatory Visit (INDEPENDENT_AMBULATORY_CARE_PROVIDER_SITE_OTHER): Payer: Medicare Other | Admitting: Family Medicine

## 2018-06-04 VITALS — BP 123/82 | HR 86 | Temp 98.2°F | Resp 16 | Ht 60.0 in | Wt 220.5 lb

## 2018-06-04 DIAGNOSIS — E785 Hyperlipidemia, unspecified: Secondary | ICD-10-CM

## 2018-06-04 DIAGNOSIS — E119 Type 2 diabetes mellitus without complications: Secondary | ICD-10-CM | POA: Diagnosis not present

## 2018-06-04 DIAGNOSIS — I10 Essential (primary) hypertension: Secondary | ICD-10-CM

## 2018-06-04 LAB — CBC WITH DIFFERENTIAL/PLATELET
Basophils Absolute: 0.1 10*3/uL (ref 0.0–0.1)
Basophils Relative: 0.8 % (ref 0.0–3.0)
Eosinophils Absolute: 0.5 10*3/uL (ref 0.0–0.7)
Eosinophils Relative: 5 % (ref 0.0–5.0)
HCT: 38.5 % (ref 36.0–46.0)
Hemoglobin: 12.8 g/dL (ref 12.0–15.0)
Lymphocytes Relative: 24.8 % (ref 12.0–46.0)
Lymphs Abs: 2.3 10*3/uL (ref 0.7–4.0)
MCHC: 33.2 g/dL (ref 30.0–36.0)
MCV: 91 fl (ref 78.0–100.0)
Monocytes Absolute: 1.1 10*3/uL — ABNORMAL HIGH (ref 0.1–1.0)
Monocytes Relative: 11.7 % (ref 3.0–12.0)
Neutro Abs: 5.3 10*3/uL (ref 1.4–7.7)
Neutrophils Relative %: 57.7 % (ref 43.0–77.0)
Platelets: 296 10*3/uL (ref 150.0–400.0)
RBC: 4.23 Mil/uL (ref 3.87–5.11)
RDW: 14.7 % (ref 11.5–15.5)
WBC: 9.2 10*3/uL (ref 4.0–10.5)

## 2018-06-04 LAB — BASIC METABOLIC PANEL
BUN: 37 mg/dL — ABNORMAL HIGH (ref 6–23)
CO2: 25 mEq/L (ref 19–32)
Calcium: 9.9 mg/dL (ref 8.4–10.5)
Chloride: 104 mEq/L (ref 96–112)
Creatinine, Ser: 1.24 mg/dL — ABNORMAL HIGH (ref 0.40–1.20)
GFR: 45.97 mL/min — ABNORMAL LOW (ref >60.00)
Glucose, Bld: 190 mg/dL — ABNORMAL HIGH (ref 70–99)
Potassium: 4.6 mEq/L (ref 3.5–5.1)
Sodium: 138 mEq/L (ref 135–145)

## 2018-06-04 LAB — HEPATIC FUNCTION PANEL
ALT: 16 U/L (ref 0–35)
AST: 13 U/L (ref 0–37)
Albumin: 4.3 g/dL (ref 3.5–5.2)
Alkaline Phosphatase: 62 U/L (ref 39–117)
Bilirubin, Direct: 0.1 mg/dL (ref 0.0–0.3)
Total Bilirubin: 0.3 mg/dL (ref 0.2–1.2)
Total Protein: 6.9 g/dL (ref 6.0–8.3)

## 2018-06-04 LAB — LIPID PANEL
Cholesterol: 119 mg/dL (ref 0–200)
HDL: 54.1 mg/dL (ref >39.00)
LDL Cholesterol: 48 mg/dL (ref 0–99)
NonHDL: 65.27
Total CHOL/HDL Ratio: 2
Triglycerides: 86 mg/dL (ref 0.0–149.0)
VLDL: 17.2 mg/dL (ref 0.0–40.0)

## 2018-06-04 LAB — TSH: TSH: 1.44 u[IU]/mL (ref 0.35–4.50)

## 2018-06-04 LAB — HEMOGLOBIN A1C: Hgb A1c MFr Bld: 7.5 % — ABNORMAL HIGH (ref 4.6–6.5)

## 2018-06-04 NOTE — Assessment & Plan Note (Signed)
Chronic problem.  Hx of adequate control.  Pt is working on diet and exercise.  UTD on foot exam, eye exam, and on ACE for renal protection.  Check labs.  Adjust meds prn

## 2018-06-04 NOTE — Assessment & Plan Note (Signed)
Chronic problem.  Tolerating statin w/o difficulty.  Check labs.  Adjust meds prn  

## 2018-06-04 NOTE — Patient Instructions (Signed)
Follow up in 3-4 months to recheck diabetes We'll notify you of your lab results and make any changes if needed Continue to work on healthy diet and regular exercise- you can do it! Call with any questions or concerns Happy Holidays!!! 

## 2018-06-04 NOTE — Assessment & Plan Note (Signed)
Chronic problem, currently asymptomatic.  Well controlled.  Check labs.  No anticipated med changes.  Will follow.

## 2018-06-04 NOTE — Progress Notes (Signed)
   Subjective:    Patient ID: Kayla Brown, female    DOB: 05/27/1951, 67 y.o.   MRN: [id number]  HPI DM- chronic problem, on Metformin, Actos, Glipizide.  On ACE for renal protection.  UTD on eye exam, foot exam.  Continues to exercise regularly- treadmill, walking.  Some symptomatic lows but rare.  Denies numbness/tingling of hands/feet.  Pt reports fasting CBGs are 85-125  HTN- chronic problem, on Amlodipine 10mg  daily, HCTZ 12.5mg  daily, Ramipril 10mg  daily, Spironolactone 25mg  daily w/ good control.  No CP, SOB, HAs,visual changes, edema.  Hyperlipidemia- chronic problem, on Lipitor 40mg  daily w/ hx of good control.  No abd pain, N/V.   Review of Systems For ROS see HPI     Objective:   Physical Exam  Constitutional: She is oriented to person, place, and time. She appears well-developed and well-nourished. No distress.  obese  HENT:  Head: Normocephalic and atraumatic.  Eyes: Pupils are equal, round, and reactive to light. Conjunctivae and EOM are normal.  Neck: Normal range of motion. Neck supple. No thyromegaly present.  Cardiovascular: Normal rate, regular rhythm, normal heart sounds and intact distal pulses.  No murmur heard. Pulmonary/Chest: Effort normal and breath sounds normal. No respiratory distress.  Abdominal: Soft. She exhibits no distension. There is no tenderness.  Musculoskeletal: She exhibits no edema.  Lymphadenopathy:    She has no cervical adenopathy.  Neurological: She is alert and oriented to person, place, and time.  Skin: Skin is warm and dry.  Psychiatric: She has a normal mood and affect. Her behavior is normal.  Vitals reviewed.         Assessment & Plan:

## 2018-06-05 ENCOUNTER — Other Ambulatory Visit: Payer: Self-pay | Admitting: Family Medicine

## 2018-06-05 DIAGNOSIS — R7989 Other specified abnormal findings of blood chemistry: Secondary | ICD-10-CM

## 2018-06-10 ENCOUNTER — Other Ambulatory Visit: Payer: Self-pay | Admitting: Family Medicine

## 2018-06-12 ENCOUNTER — Other Ambulatory Visit (INDEPENDENT_AMBULATORY_CARE_PROVIDER_SITE_OTHER): Payer: Medicare Other

## 2018-06-12 DIAGNOSIS — R7989 Other specified abnormal findings of blood chemistry: Secondary | ICD-10-CM | POA: Diagnosis not present

## 2018-06-12 LAB — BASIC METABOLIC PANEL
BUN: 50 mg/dL — ABNORMAL HIGH (ref 6–23)
CO2: 22 mEq/L (ref 19–32)
Calcium: 10 mg/dL (ref 8.4–10.5)
Chloride: 107 mEq/L (ref 96–112)
Creatinine, Ser: 1.06 mg/dL (ref 0.40–1.20)
GFR: 55.09 mL/min — ABNORMAL LOW (ref >60.00)
Glucose, Bld: 74 mg/dL (ref 70–99)
Potassium: 4.2 mEq/L (ref 3.5–5.1)
Sodium: 138 mEq/L (ref 135–145)

## 2018-06-13 ENCOUNTER — Encounter: Payer: Self-pay | Admitting: General Practice

## 2018-06-17 ENCOUNTER — Other Ambulatory Visit: Payer: Self-pay | Admitting: Family Medicine

## 2018-07-22 ENCOUNTER — Other Ambulatory Visit: Payer: Self-pay | Admitting: Family Medicine

## 2018-07-24 ENCOUNTER — Ambulatory Visit: Payer: Medicare Other | Admitting: Cardiology

## 2018-07-25 DIAGNOSIS — H524 Presbyopia: Secondary | ICD-10-CM | POA: Diagnosis not present

## 2018-07-25 DIAGNOSIS — E119 Type 2 diabetes mellitus without complications: Secondary | ICD-10-CM | POA: Diagnosis not present

## 2018-09-08 ENCOUNTER — Other Ambulatory Visit: Payer: Self-pay | Admitting: Family Medicine

## 2018-09-17 ENCOUNTER — Other Ambulatory Visit: Payer: Self-pay | Admitting: Family Medicine

## 2018-10-01 ENCOUNTER — Ambulatory Visit: Payer: Medicare Other | Admitting: Family Medicine

## 2018-10-09 ENCOUNTER — Ambulatory Visit: Payer: Medicare Other | Admitting: Family Medicine

## 2018-10-09 ENCOUNTER — Ambulatory Visit (INDEPENDENT_AMBULATORY_CARE_PROVIDER_SITE_OTHER): Payer: Medicare Other | Admitting: Family Medicine

## 2018-10-09 ENCOUNTER — Other Ambulatory Visit: Payer: Self-pay

## 2018-10-09 ENCOUNTER — Encounter: Payer: Self-pay | Admitting: Family Medicine

## 2018-10-09 VITALS — BP 147/65 | HR 72 | Temp 96.8°F | Wt 219.0 lb

## 2018-10-09 DIAGNOSIS — E2839 Other primary ovarian failure: Secondary | ICD-10-CM

## 2018-10-09 DIAGNOSIS — I10 Essential (primary) hypertension: Secondary | ICD-10-CM | POA: Diagnosis not present

## 2018-10-09 DIAGNOSIS — E119 Type 2 diabetes mellitus without complications: Secondary | ICD-10-CM

## 2018-10-09 MED ORDER — SPIRONOLACTONE 25 MG PO TABS: 12.5000 mg | ORAL_TABLET | Freq: Every day | ORAL | 1 refills | Status: DC

## 2018-10-09 NOTE — Progress Notes (Signed)
Virtual Visit via Video   I connected with Kayla Brown on 10/09/18 at  2:40 PM EDT by a video enabled telemedicine application and verified that I am speaking with the correct person using two identifiers. Location patient: Home Location provider: [facility], Office Persons participating in the virtual visit: pt and myself  I discussed the limitations of evaluation and management by telemedicine and the availability of in person appointments. The patient expressed understanding and agreed to proceed.  Subjective:   HPI:  DM- chronic problem, on Glipizide 10mg  daily, Metformin 1000mg  BID, Actos 30mg  daily.  UTD on foot exam.  On ACE for renal protection.  Weight is stable.  No abd pain, N/V.  Denies symptomatic lows.  Fasting sugars 90s-110s.  Denies numbness/tingling of hands/feet.  Had eye exam in January 31st w/ Dr Cathey Endow.  HTN- chronic problem, on Spironolactone 25mg  daily, Ramipril 10mg  daily, HCTZ 12.5mg  daily and Amlodipine 10mg  daily.  BP is elevated today.  Pt reports she has not been able to get out and exercise like she used to.  No CP, SOB, HAs, visual changes, edema.  Obesity- pt is not exercising regularly, not following particular diet  ROS: See pertinent positives and negatives per HPI.  Patient Active Problem List   Diagnosis Date Noted  . Passing gas 07/01/2017  . Physical exam 02/06/2017  . Anxiety and depression 12/05/2016  . Edema of extremities 03/02/2016  . Morbid obesity (HCC) 03/01/2016  . OSA (obstructive sleep apnea) 07/11/2015  . Pulmonary hypertension (HCC) 05/06/2015  . Heart murmur 03/03/2015  . Essential hypertension, benign 03/18/2014  . Hyperlipidemia 03/18/2014  . Post-menopause on HRT (hormone replacement therapy) 03/18/2014  . Diabetes (HCC) 01/01/2013    Social History   Tobacco Use  . Smoking status: Never Smoker  . Smokeless tobacco: Never Used  Substance Use Topics  . Alcohol use: No    Alcohol/week: 0.0 standard drinks     Current Outpatient Medications:  .  Alcohol Swabs (ALCOHOL PREP) PADS, Pt uses an alcohol pad each time sugars are tested. Pt tests twice daily. Dx E11.9, Disp: 100 each, Rfl: 3 .  amLODipine (NORVASC) 10 MG tablet, TAKE 1 TABLET BY MOUTH EVERY DAY, Disp: 90 tablet, Rfl: 1 .  atorvastatin (LIPITOR) 40 MG tablet, TAKE 1 TABLET BY MOUTH EVERY DAY, Disp: 90 tablet, Rfl: 1 .  Cholecalciferol (VITAMIN D PO), Take 2,000 Int'l Units by mouth daily. , Disp: , Rfl:  .  fish oil-omega-3 fatty acids 1000 MG capsule, Take 1 g by mouth daily., Disp: , Rfl:  .  fluticasone (FLONASE) 50 MCG/ACT nasal spray, SPRAY 2 SPRAYS INTO EACH NOSTRIL EVERY DAY, Disp: 18 g, Rfl: 1 .  glipiZIDE (GLUCOTROL XL) 10 MG 24 hr tablet, TAKE 1 TABLET BY MOUTH ONCE DAILY WITH BREAKFAST, Disp: 90 tablet, Rfl: 0 .  glucose blood (FREESTYLE LITE) test strip, USE TO TEST BLOOD GLUCOSE 2 TIMES DAILY. Dx E11.9, Disp: 100 each, Rfl: 12 .  hydrochlorothiazide (MICROZIDE) 12.5 MG capsule, TAKE 1 CAPSULE (12.5 MG TOTAL) BY MOUTH 2 (TWO) TIMES DAILY., Disp: 180 capsule, Rfl: 1 .  loratadine (CLARITIN) 10 MG tablet, TAKE 1 TABLET BY MOUTH EVERY DAY, Disp: 30 tablet, Rfl: 11 .  meclizine (ANTIVERT) 25 MG tablet, Take 1 tablet (25 mg total) by mouth 3 (three) times daily as needed for dizziness., Disp: 30 tablet, Rfl: 0 .  MELATONIN PO, Take 3 mg by mouth at bedtime. , Disp: , Rfl:  .  metFORMIN (GLUCOPHAGE-XR) 500 MG  24 hr tablet, TAKE 2 TABLETS BY MOUTH TWICE DAILY BEFORE A MEAL, Disp: 360 tablet, Rfl: 0 .  Multiple Vitamins-Minerals (VISION FORMULA) TABS, Take 1 tablet by mouth every morning. , Disp: , Rfl:  .  OVER THE COUNTER MEDICATION, Equate Vision Formula 50+, Disp: , Rfl:  .  pioglitazone (ACTOS) 30 MG tablet, TAKE 1 TABLET BY MOUTH EVERY DAY, Disp: 90 tablet, Rfl: 1 .  ramipril (ALTACE) 10 MG capsule, TAKE 2 CAPSULES BY MOUTH EVERY DAY, Disp: 180 capsule, Rfl: 0 .  sertraline (ZOLOFT) 50 MG tablet, TAKE 1 TABLET BY MOUTH EVERY DAY,  Disp: 90 tablet, Rfl: 2 .  spironolactone (ALDACTONE) 25 MG tablet, TAKE 0.5 TABLETS BY MOUTH DAILY. PLEASE MAKE APPT WITH DR. Mayford KnifeURNER FOR MARCH BEFORE ANYMORE REFILLS., Disp: 30 tablet, Rfl: 10 .  Suvorexant (BELSOMRA) 10 MG TABS, Take 10 mg by mouth at bedtime., Disp: 30 tablet, Rfl: 1 .  TRUEPLUS LANCETS 30G MISC, Use one lancet each time sugars are tested. Pt tests twice daily. Dx. E11.9, Disp: 100 each, Rfl: 3  No Known Allergies  Objective:   BP (!) 147/65   Pulse 72   Temp (!) 96.8 F (36 C) (Oral)   Wt 219 lb (99.3 kg)   LMP 06/25/2008   BMI 42.77 kg/m  AAOx3, NAD NCAT, EOMI No obvious CN deficits Coloring WNL Pt is able to speak clearly, coherently without shortness of breath or increased work of breathing.  Thought process is linear.  Mood is appropriate.   Assessment and Plan:    DM- chronic problem.  Pt reports good CBGs.  UTD on eye exam- results abstracted.  Stressed need for healthy diet and regular exercise.  Check labs.  Adjust meds prn   HTN- BP is elevated today.  She is asymptomatic at this time.  Taking all meds.  Provided a refill on Spironolactone as pt was not able to see Dr Mayford Knifeurner prior to COVID crisis.  Will repeat BP when she comes for labs tomorrow.  Pt expressed understanding and is in agreement w/ plan.   Obesity- ongoing issue for pt.  Stressed need to focus on healthy diet and regular exercise.  Will follow.   Neena RhymesKatherine Ebonee Stober, MD 10/09/2018

## 2018-10-10 ENCOUNTER — Other Ambulatory Visit (INDEPENDENT_AMBULATORY_CARE_PROVIDER_SITE_OTHER): Payer: Medicare Other

## 2018-10-10 DIAGNOSIS — E119 Type 2 diabetes mellitus without complications: Secondary | ICD-10-CM | POA: Diagnosis not present

## 2018-10-10 LAB — BASIC METABOLIC PANEL
BUN: 34 mg/dL — ABNORMAL HIGH (ref 6–23)
CO2: 25 mEq/L (ref 19–32)
Calcium: 10.4 mg/dL (ref 8.4–10.5)
Chloride: 104 mEq/L (ref 96–112)
Creatinine, Ser: 1 mg/dL (ref 0.40–1.20)
GFR: 55.38 mL/min — ABNORMAL LOW (ref >60.00)
Glucose, Bld: 85 mg/dL (ref 70–99)
Potassium: 4.7 mEq/L (ref 3.5–5.1)
Sodium: 140 mEq/L (ref 135–145)

## 2018-10-10 LAB — HEMOGLOBIN A1C: Hgb A1c MFr Bld: 7.8 % — ABNORMAL HIGH (ref 4.6–6.5)

## 2018-10-13 ENCOUNTER — Other Ambulatory Visit: Payer: Self-pay | Admitting: Family Medicine

## 2018-10-13 DIAGNOSIS — Z1231 Encounter for screening mammogram for malignant neoplasm of breast: Secondary | ICD-10-CM

## 2018-11-30 ENCOUNTER — Other Ambulatory Visit: Payer: Self-pay | Admitting: Family Medicine

## 2018-12-29 ENCOUNTER — Ambulatory Visit: Payer: Medicare Other | Admitting: Cardiology

## 2019-01-06 ENCOUNTER — Other Ambulatory Visit: Payer: Self-pay | Admitting: Family Medicine

## 2019-01-16 ENCOUNTER — Ambulatory Visit
Admission: RE | Admit: 2019-01-16 | Discharge: 2019-01-16 | Disposition: A | Discharge status: Home / Self Care | Payer: Medicare Other | Source: Ambulatory Visit | Attending: Family Medicine | Admitting: Family Medicine

## 2019-01-16 ENCOUNTER — Other Ambulatory Visit: Payer: Self-pay

## 2019-01-16 DIAGNOSIS — Z1231 Encounter for screening mammogram for malignant neoplasm of breast: Secondary | ICD-10-CM | POA: Diagnosis not present

## 2019-01-16 DIAGNOSIS — M85852 Other specified disorders of bone density and structure, left thigh: Secondary | ICD-10-CM | POA: Diagnosis not present

## 2019-01-16 DIAGNOSIS — E2839 Other primary ovarian failure: Secondary | ICD-10-CM

## 2019-01-16 DIAGNOSIS — Z780 Asymptomatic menopausal state: Secondary | ICD-10-CM | POA: Diagnosis not present

## 2019-01-19 ENCOUNTER — Telehealth: Payer: Self-pay | Admitting: *Deleted

## 2019-01-19 ENCOUNTER — Other Ambulatory Visit: Payer: Self-pay | Admitting: Family Medicine

## 2019-01-19 NOTE — Telephone Encounter (Signed)
Please advise on dosage for calcium

## 2019-01-19 NOTE — Telephone Encounter (Signed)
Patient called and left a VM at front desk that she received a call about her bone density results and she was told to continue calcium and vitamin d.  She stated on the VM that she only takes a vitamin d3 but is not on any calcium so she is questioning what she should be taking.  Patient asked that she be called back. She said it was okay to leave a detailed message if she does not answer.

## 2019-01-20 NOTE — Telephone Encounter (Signed)
Ideally 1200 units of Calcium but [phone] would be fine

## 2019-01-20 NOTE — Telephone Encounter (Signed)
Patient notified of PCP recommendations and is agreement and expresses an understanding.   Ok for PEC to Discuss results / PCP recommendations / Schedule patient.   

## 2019-01-21 ENCOUNTER — Telehealth: Payer: Self-pay | Admitting: *Deleted

## 2019-01-21 NOTE — Telephone Encounter (Signed)
Pt has answered NO to all prescreen questions.         COVID-19 Pre-Screening Questions:  . In the past 7 to 10 days have you had a cough,  shortness of breath, headache, congestion, fever (100 or greater) body aches, chills, sore throat, or sudden loss of taste or sense of smell? . Have you been around anyone with known Covid 19. . Have you been around anyone who is awaiting Covid 19 test results in the past 7 to 10 days? . Have you been around anyone who has been exposed to Covid 19, or has mentioned symptoms of Covid 19 within the past 7 to 10 days?  If you have any concerns/questions about symptoms patients report during screening (either on the phone or at threshold). Contact the provider seeing the patient or DOD for further guidance.  If neither are available contact a member of the leadership team.            

## 2019-01-22 ENCOUNTER — Ambulatory Visit (INDEPENDENT_AMBULATORY_CARE_PROVIDER_SITE_OTHER): Payer: Medicare Other | Admitting: Cardiology

## 2019-01-22 ENCOUNTER — Other Ambulatory Visit: Payer: Self-pay

## 2019-01-22 ENCOUNTER — Encounter: Payer: Self-pay | Admitting: Cardiology

## 2019-01-22 VITALS — BP 186/81 | HR 77 | Ht 60.0 in | Wt 227.2 lb

## 2019-01-22 DIAGNOSIS — I10 Essential (primary) hypertension: Secondary | ICD-10-CM

## 2019-01-22 DIAGNOSIS — R0602 Shortness of breath: Secondary | ICD-10-CM | POA: Diagnosis not present

## 2019-01-22 DIAGNOSIS — G4733 Obstructive sleep apnea (adult) (pediatric): Secondary | ICD-10-CM

## 2019-01-22 DIAGNOSIS — I272 Pulmonary hypertension, unspecified: Secondary | ICD-10-CM

## 2019-01-22 MED ORDER — CHLORTHALIDONE 50 MG PO TABS: 50.0000 mg | ORAL_TABLET | Freq: Every day | ORAL | 3 refills | Status: DC

## 2019-01-22 NOTE — Progress Notes (Signed)
Cardiology Office Note:    Date:  01/22/2019   ID:  Kayla Brown, DOB 12/22/1950, MRN [id number]  PCP:  [name], MD  Cardiologist:  No primary care provider on file.    Referring MD: [name], MD   Chief Complaint  Patient presents with  . Sleep Apnea  . Hypertension    History of Present Illness:    Kayla Brown is a 68 y.o. female who presents for followup of OSA, HTN, moderate pulmonary HTN (PASP 67mmHg by echo 02/2015)and dyslipidemia. She has severe OSA with an AHI of 36.9/hr and is on CPAP at 19cm H2O.  She is here today for followup and is doing well.  She denies any chest pain or pressure, SOB, DOE, PND, orthopnea, LE edema, dizziness, palpitations or syncope. She is compliant with her meds and is tolerating meds with no SE.  She is doing well with her CPAP device and thinks that she has gotten used to it.  She tolerates the mask and feels the pressure is adequate.  Since going on CPAP she feels rested in the am and has no significant daytime sleepiness.  She denies any significant mouth or nasal dryness or nasal congestion.  She does not think that he snores.     Past Medical History:  Diagnosis Date  . Anxiety   . Diabetes mellitus   . Edema of extremities 03/02/2016  . Essential hypertension, benign   . Lazy eye   . Major depressive disorder, single episode, unspecified   . Obesity (BMI 30-39.9) 03/01/2016  . OSA (obstructive sleep apnea)   . Pulmonary HTN ([place])    mild with PASP 35mmHg by echo 05/2016  . Pure hypercholesterolemia   . Retinopathy, due to hypertension     Past Surgical History:  Procedure Laterality Date  . CPAP TITRATION  10/14/2015  . DILATION AND CURETTAGE OF UTERUS  2005-2008   x2  . EYE SURGERY     cataracts  . WISDOM TOOTH EXTRACTION      Current Medications: Current Meds  Medication Sig  . Alcohol Swabs (ALCOHOL PREP) PADS Pt uses an alcohol pad each time sugars are tested. Pt tests twice daily. Dx E11.9  .  amLODipine (NORVASC) 10 MG tablet TAKE 1 TABLET BY MOUTH EVERY DAY  . atorvastatin (LIPITOR) 40 MG tablet TAKE 1 TABLET BY MOUTH EVERY DAY  . Cholecalciferol (VITAMIN D PO) Take 2,000 Int'l Units by mouth daily.   . fish oil-omega-3 fatty acids 1000 MG capsule Take 1 g by mouth daily.  . fluticasone (FLONASE) 50 MCG/ACT nasal spray SPRAY 2 SPRAYS INTO EACH NOSTRIL EVERY DAY  . glipiZIDE (GLUCOTROL XL) 10 MG 24 hr tablet TAKE 1 TABLET BY MOUTH ONCE DAILY WITH BREAKFAST  . glucose blood (FREESTYLE LITE) test strip USE TO TEST BLOOD GLUCOSE 2 TIMES DAILY. Dx E11.9  . loratadine (CLARITIN) 10 MG tablet TAKE 1 TABLET BY MOUTH EVERY DAY  . meclizine (ANTIVERT) 25 MG tablet Take 1 tablet (25 mg total) by mouth 3 (three) times daily as needed for dizziness.  [name] MELATONIN PO Take 3 mg by mouth at bedtime.   . metFORMIN (GLUCOPHAGE-XR) 500 MG 24 hr tablet TAKE 2 TABLETS BY MOUTH TWICE DAILY BEFORE A MEAL  . Multiple Vitamins-Minerals (VISION FORMULA) TABS Take 1 tablet by mouth every morning.   [name] OVER THE COUNTER MEDICATION Equate Vision Formula 50+  . pioglitazone (ACTOS) 30 MG tablet TAKE 1 TABLET BY MOUTH EVERY DAY  . ramipril (ALTACE)  10 MG capsule TAKE 2 CAPSULES BY MOUTH EVERY DAY  . sertraline (ZOLOFT) 50 MG tablet TAKE 1 TABLET BY MOUTH EVERY DAY  . spironolactone (ALDACTONE) 25 MG tablet TAKE 1/2 TABLET BY MOUTH EVERY DAY  . Suvorexant (BELSOMRA) 10 MG TABS Take 10 mg by mouth at bedtime.  . TRUEPLUS LANCETS 30G MISC Use one lancet each time sugars are tested. Pt tests twice daily. Dx. E11.9  . [DISCONTINUED] hydrochlorothiazide (MICROZIDE) 12.5 MG capsule TAKE 1 CAPSULE (12.5 MG TOTAL) BY MOUTH 2 (TWO) TIMES DAILY.     Allergies:   Patient has no known allergies.   Social History   Socioeconomic History  . Marital status: Married    Spouse name: Not on file  . Number of children: Not on file  . Years of education: Not on file  . Highest education level: Not on file  Occupational  History  . Not on file  Social Needs  . Financial resource strain: Not on file  . Food insecurity    Worry: Not on file    Inability: Not on file  . Transportation needs    Medical: Not on file    Non-medical: Not on file  Tobacco Use  . Smoking status: Never Smoker  . Smokeless tobacco: Never Used  Substance and Sexual Activity  . Alcohol use: No    Alcohol/week: 0.0 standard drinks  . Drug use: No  . Sexual activity: Yes    Partners: Male    Birth control/protection: None  Lifestyle  . Physical activity    Days per week: Not on file    Minutes per session: Not on file  . Stress: Not on file  Relationships  . Social Musicianconnections    Talks on phone: Not on file    Gets together: Not on file    Attends religious service: Not on file    Active member of club or organization: Not on file    Attends meetings of clubs or organizations: Not on file    Relationship status: Not on file  Other Topics Concern  . Not on file  Social History Narrative  . Not on file     Family History: The patient's family history includes ADD / ADHD in her mother; Alcohol abuse in her mother; Dementia in her mother. There is no history of Breast cancer. She was adopted.  ROS:   Please see the history of present illness.    ROS  All other systems reviewed and negative.   EKGs/Labs/Other Studies Reviewed:    The following studies were reviewed today: PAP compliance download  EKG:  EKG is  ordered today.  The ekg ordered today demonstrates NSR with no ST changes  Recent Labs: 06/04/2018: ALT 16; Hemoglobin 12.8; Platelets 296.0; TSH 1.44 10/10/2018: BUN 34; Creatinine, Ser 1.00; Potassium 4.7; Sodium 140   Recent Lipid Panel    Component Value Date/Time   CHOL 119 06/04/2018 1351   TRIG 86.0 06/04/2018 1351   HDL 54.10 06/04/2018 1351   CHOLHDL 2 06/04/2018 1351   VLDL 17.2 06/04/2018 1351   LDLCALC 48 06/04/2018 1351   LDLDIRECT 55.0 10/21/2017 1503    Physical Exam:    VS:  BP  (!) 186/81   Pulse 77   Ht 5' (1.524 m)   Wt 227 lb 3.2 oz (103.1 kg)   LMP 06/25/2008   BMI 44.37 kg/m     Wt Readings from Last 3 Encounters:  01/22/19 227 lb 3.2 oz (103.1 kg)  10/09/18 219 lb (99.3 kg)  06/04/18 220 lb 8 oz (100 kg)     GEN:  Well nourished, well developed in no acute distress HEENT: Normal NECK: No JVD; No carotid bruits LYMPHATICS: No lymphadenopathy CARDIAC: RRR, no murmurs, rubs, gallops RESPIRATORY:  Clear to auscultation without rales, wheezing or rhonchi  ABDOMEN: Soft, non-tender, non-distended MUSCULOSKELETAL:  No edema; No deformity  SKIN: Warm and dry NEUROLOGIC:  Alert and oriented x 3 PSYCHIATRIC:  Normal affect   ASSESSMENT:    1. OSA (obstructive sleep apnea)   2. Essential hypertension, benign   3. Pulmonary hypertension (HCC)   4. Morbid obesity (HCC)   5. Shortness of breath    PLAN:    In order of problems listed above:  1.  OSA - The pathophysiology of obstructive sleep apnea , it's cardiovascular consequences & modes of treatment including CPAP were discused with the patient in detail & they evidenced understanding.  The patient is tolerating PAP therapy well without any problems. The PAP download was reviewed today and showed an AHI of 0.3/hr on 19 cm H2O with 100% compliance in using more than 4 hours nightly.  The patient has been using and benefiting from PAP use and will continue to benefit from therapy.   2.  Hypertension -BP is poorly controlled on exam -continue on amlodipine 10mg  daily, HCTZ 12.5mg  daily, spiro 12.5mg  daily and Ramipril 10mg  daily.   -last creatinine was 1 on 10/10/2018.  K+ was 4.7.  3.  Mild to moderate pulmonary HTN -2D echo at year ago showed PAP has normalized with PASP 33mmhg. -since she is now having worsening SOB I will repeat an echo to reassess PAP  4.  Morbid Obesity  -I have encouraged her to get into a routine exercise program and cut back on carbs and portions.   5.  SOB -occurs  mainly when she is trying to exercise with exercise intolerance -she is unable to increase her exercise time due to severe DOE and fatigue -suspect this is related to her morbid obesity -I will get a Lexiscan myoview to rule out ischemia and 2D echo to make sure LVF is still stable. -if stress test and echo normal then I have encouraged her to get into a good exercise and weight loss program.   Medication Adjustments/Labs and Tests Ordered: Current medicines are reviewed at length with the patient today.  Concerns regarding medicines are outlined above.  Orders Placed This Encounter  Procedures  . Basic metabolic panel  . Myocardial Perfusion Imaging  . EKG 12-Lead  . ECHOCARDIOGRAM COMPLETE   Meds ordered this encounter  Medications  . chlorthalidone (HYGROTON) 50 MG tablet    Sig: Take 1 tablet (50 mg total) by mouth daily.    Dispense:  90 tablet    Refill:  3    Signed, Armanda Magicraci Karole Oo, MD  01/22/2019 3:53 PM    [facility]

## 2019-01-22 NOTE — Patient Instructions (Addendum)
Medication Instructions:   Your physician has recommended you make the following change in your medication:   1. Stop your HCTZ 2. Begin Chlortalidone, 50mg  tablet, once daily   Labwork: None ordered.  Testing/Procedures: Your physician has requested that you have a lexiscan myoview. For further information please visit [email or web address]. Please follow instruction sheet, as given.  Your physician has requested that you have an echocardiogram. Echocardiography is a painless test that uses sound waves to create images of your heart. It provides your doctor with information about the size and shape of your heart and how well your heart's chambers and valves are working. This procedure takes approximately one hour. There are no restrictions for this procedure.   Follow-Up: Your physician recommends that you schedule a follow-up appointment in:   1 week with the hypertension clinic  1 week for a BMP (same day as your hypertension clinic appointment)  12 months with Dr. Radford Pax  Any Other Special Instructions Will Be Listed Below (If Applicable).     If you need a refill on your cardiac medications before your next appointment, please call your pharmacy.

## 2019-01-26 ENCOUNTER — Telehealth: Payer: Self-pay

## 2019-01-26 NOTE — Telephone Encounter (Signed)
Please advise 

## 2019-01-26 NOTE — Telephone Encounter (Signed)
Patient called in c/o irregularly high glucose readings. Requesting appt with PCP, unable to make in office visit until 8/12. Patient was adamant about only seeing PCP. Has routine DM f/u scheduled on 8/26. Please advise if possibly doxy or telephone call can be scheduled at 430?

## 2019-01-26 NOTE — Telephone Encounter (Signed)
Pt scheduled  

## 2019-01-26 NOTE — Telephone Encounter (Signed)
Pt can be scheduled virtually at 4:00 or 4:15 but I'm not back in office until 8/10.  So if she wants to come in, the 8/12 appt would be fine.

## 2019-01-28 ENCOUNTER — Ambulatory Visit: Payer: Medicare Other | Admitting: Family Medicine

## 2019-01-29 ENCOUNTER — Telehealth (HOSPITAL_COMMUNITY): Payer: Self-pay | Admitting: *Deleted

## 2019-01-29 NOTE — Telephone Encounter (Signed)
Patient given detailed instructions per Myocardial Perfusion Study Information Sheet for the test on 02/02/19. Patient notified to arrive 15 minutes early and that it is imperative to arrive on time for appointment to keep from having the test rescheduled.  If you need to cancel or reschedule your appointment, please call the office within 24 hours of your appointment. . Patient verbalized understanding. Kayla Brown    

## 2019-02-02 ENCOUNTER — Ambulatory Visit (HOSPITAL_BASED_OUTPATIENT_CLINIC_OR_DEPARTMENT_OTHER): Payer: Medicare Other

## 2019-02-02 ENCOUNTER — Ambulatory Visit (HOSPITAL_COMMUNITY): Payer: Medicare Other | Attending: Cardiovascular Disease

## 2019-02-02 ENCOUNTER — Other Ambulatory Visit: Payer: Self-pay

## 2019-02-02 DIAGNOSIS — R0602 Shortness of breath: Secondary | ICD-10-CM

## 2019-02-02 LAB — MYOCARDIAL PERFUSION IMAGING
LV dias vol: 97 mL (ref 46–106)
LV sys vol: 44 mL
Peak HR: 104 {beats}/min
Rest HR: 76 {beats}/min
SDS: 1
SRS: 5
SSS: 6
TID: 0.87

## 2019-02-02 LAB — ECHOCARDIOGRAM COMPLETE
Height: 60 in
Weight: 3632 oz

## 2019-02-02 MED ORDER — REGADENOSON 0.4 MG/5ML IV SOLN
0.4000 mg | Freq: Once | INTRAVENOUS | Status: AC
  Administered 2019-02-02: 0.4 mg via INTRAVENOUS

## 2019-02-02 MED ORDER — TECHNETIUM TC 99M TETROFOSMIN IV KIT
10.7000 | PACK | Freq: Once | INTRAVENOUS | Status: AC | PRN
  Administered 2019-02-02: 10.7 via INTRAVENOUS
  Filled 2019-02-02: qty 11

## 2019-02-02 MED ORDER — TECHNETIUM TC 99M TETROFOSMIN IV KIT
32.5000 | PACK | Freq: Once | INTRAVENOUS | Status: AC | PRN
  Administered 2019-02-02: 32.5 via INTRAVENOUS
  Filled 2019-02-02: qty 33

## 2019-02-03 ENCOUNTER — Telehealth: Payer: Self-pay | Admitting: Nurse Practitioner

## 2019-02-03 DIAGNOSIS — R0602 Shortness of breath: Secondary | ICD-10-CM

## 2019-02-03 DIAGNOSIS — G4733 Obstructive sleep apnea (adult) (pediatric): Secondary | ICD-10-CM

## 2019-02-03 NOTE — Telephone Encounter (Signed)
Results of echo and myovue and plan of care reviewed with patient who verbalized understanding. I discussed referral to pulmonary with patient and she consents. States she has difficulty increasing her exercise endurance due to SOB. She thanked me for the call.

## 2019-02-03 NOTE — Telephone Encounter (Signed)
-----   Message from Sueanne Margarita, MD sent at 02/02/2019  3:26 PM EDT ----- Please refer to pulmonary for SOB

## 2019-02-05 ENCOUNTER — Other Ambulatory Visit: Payer: Self-pay

## 2019-02-05 ENCOUNTER — Ambulatory Visit: Payer: Medicare Other | Admitting: Family Medicine

## 2019-02-05 ENCOUNTER — Ambulatory Visit (INDEPENDENT_AMBULATORY_CARE_PROVIDER_SITE_OTHER): Payer: Medicare Other | Admitting: Family Medicine

## 2019-02-05 ENCOUNTER — Encounter: Payer: Self-pay | Admitting: Family Medicine

## 2019-02-05 VITALS — BP 184/73 | HR 73 | Temp 96.3°F | Ht 60.0 in | Wt 222.0 lb

## 2019-02-05 DIAGNOSIS — E119 Type 2 diabetes mellitus without complications: Secondary | ICD-10-CM | POA: Diagnosis not present

## 2019-02-05 DIAGNOSIS — E785 Hyperlipidemia, unspecified: Secondary | ICD-10-CM

## 2019-02-05 DIAGNOSIS — I272 Pulmonary hypertension, unspecified: Secondary | ICD-10-CM

## 2019-02-05 DIAGNOSIS — I10 Essential (primary) hypertension: Secondary | ICD-10-CM

## 2019-02-05 NOTE — Patient Instructions (Addendum)
Please bring your blood pressure monitor and log with you for your next visit. Try to take your blood pressure at least a few days per week 1-2 hours after taking your medicines in the morning.   Please start taking hydralazine 25 mg (1 tablet) by mouth three times daily. We have sent this prescription to your pharmacy.  Continue to take your other medications as prescribed.  Try to order healthier options (chicken, salads) when you order at fast food restaurants.  Try to increase your activity level by walking around the apartment, climbing stairs, leg exercises, etc as discussed today.  Please see [place] again in about 3 weeks.  Your appointment is on Tuesday, 9/8 @ 2:30  Please call [phone] if you have any questions.

## 2019-02-05 NOTE — Progress Notes (Signed)
I have discussed the procedure for the virtual visit with the patient who has given consent to proceed with assessment and treatment.   Lexa Coronado L Marquis Down, CMA     

## 2019-02-05 NOTE — Progress Notes (Signed)
Virtual Visit via Video   I connected with patient on 02/05/19 at  4:00 PM EDT by a video enabled telemedicine application and verified that I am speaking with the correct person using two identifiers.  Location patient: Home Location provider: [facility], Office Persons participating in the virtual visit: Patient, Provider, CMA (Jess B)  I discussed the limitations of evaluation and management by telemedicine and the availability of in person appointments. The patient expressed understanding and agreed to proceed.  Subjective:   HPI:   HTN- pt saw Cards on 7/30 and BP was quite elevated.  At that visit, her HCTZ was stopped and she was started on Chlorthalidone.  She was instructed to f/u w/ HTN Clinic in 1 week.  She has appt upcoming on 8/17  DM- pt reports labile sugars.  On Glipizide XL 10mg  daily, Metformin XR 1000mg  BID, Actos 30mg  daily.  On ACE for renal protection.  AM CBGs 130-150.  Post prandial CBGs 'are sometimes over 200'.  Reports she is eating 'the same thing basically every day'.  SOB- has appt upcoming w/ pulmonary.  ECHO done on Monday 'was good'.  Hyperlipidemia- chronic problem, on Lipitor 40mg  daily.  ROS:   See pertinent positives and negatives per HPI.  Patient Active Problem List   Diagnosis Date Noted  . Passing gas 07/01/2017  . Physical exam 02/06/2017  . Anxiety and depression 12/05/2016  . Edema of extremities 03/02/2016  . Morbid obesity (HCC) 03/01/2016  . OSA (obstructive sleep apnea) 07/11/2015  . Pulmonary hypertension (HCC) 05/06/2015  . Heart murmur 03/03/2015  . Essential hypertension, benign 03/18/2014  . Hyperlipidemia 03/18/2014  . Post-menopause on HRT (hormone replacement therapy) 03/18/2014  . Diabetes (HCC) 01/01/2013    Social History   Tobacco Use  . Smoking status: Never Smoker  . Smokeless tobacco: Never Used  Substance Use Topics  . Alcohol use: No    Alcohol/week: 0.0 standard drinks    Current  Outpatient Medications:  .  Alcohol Swabs (ALCOHOL PREP) PADS, Pt uses an alcohol pad each time sugars are tested. Pt tests twice daily. Dx E11.9, Disp: 100 each, Rfl: 3 .  amLODipine (NORVASC) 10 MG tablet, TAKE 1 TABLET BY MOUTH EVERY DAY, Disp: 90 tablet, Rfl: 1 .  atorvastatin (LIPITOR) 40 MG tablet, TAKE 1 TABLET BY MOUTH EVERY DAY, Disp: 90 tablet, Rfl: 1 .  chlorthalidone (HYGROTON) 50 MG tablet, Take 1 tablet (50 mg total) by mouth daily., Disp: 90 tablet, Rfl: 3 .  Cholecalciferol (VITAMIN D PO), Take 2,000 Int'l Units by mouth daily. , Disp: , Rfl:  .  fish oil-omega-3 fatty acids 1000 MG capsule, Take 1 g by mouth daily., Disp: , Rfl:  .  fluticasone (FLONASE) 50 MCG/ACT nasal spray, SPRAY 2 SPRAYS INTO EACH NOSTRIL EVERY DAY, Disp: 18 g, Rfl: 1 .  glipiZIDE (GLUCOTROL XL) 10 MG 24 hr tablet, TAKE 1 TABLET BY MOUTH ONCE DAILY WITH BREAKFAST, Disp: 90 tablet, Rfl: 0 .  glucose blood (FREESTYLE LITE) test strip, USE TO TEST BLOOD GLUCOSE 2 TIMES DAILY. Dx E11.9, Disp: 100 each, Rfl: 12 .  loratadine (CLARITIN) 10 MG tablet, TAKE 1 TABLET BY MOUTH EVERY DAY, Disp: 30 tablet, Rfl: 11 .  meclizine (ANTIVERT) 25 MG tablet, Take 1 tablet (25 mg total) by mouth 3 (three) times daily as needed for dizziness., Disp: 30 tablet, Rfl: 0 .  MELATONIN PO, Take 3 mg by mouth at bedtime. , Disp: , Rfl:  .  metFORMIN (GLUCOPHAGE-XR) 500  MG 24 hr tablet, TAKE 2 TABLETS BY MOUTH TWICE DAILY BEFORE A MEAL, Disp: 360 tablet, Rfl: 0 .  Multiple Vitamins-Minerals (VISION FORMULA) TABS, Take 1 tablet by mouth every morning. , Disp: , Rfl:  .  OVER THE COUNTER MEDICATION, Equate Vision Formula 50+, Disp: , Rfl:  .  pioglitazone (ACTOS) 30 MG tablet, TAKE 1 TABLET BY MOUTH EVERY DAY, Disp: 90 tablet, Rfl: 1 .  ramipril (ALTACE) 10 MG capsule, TAKE 2 CAPSULES BY MOUTH EVERY DAY, Disp: 180 capsule, Rfl: 0 .  sertraline (ZOLOFT) 50 MG tablet, TAKE 1 TABLET BY MOUTH EVERY DAY, Disp: 90 tablet, Rfl: 2 .   spironolactone (ALDACTONE) 25 MG tablet, TAKE 1/2 TABLET BY MOUTH EVERY DAY, Disp: 45 tablet, Rfl: 1 .  Suvorexant (BELSOMRA) 10 MG TABS, Take 10 mg by mouth at bedtime., Disp: 30 tablet, Rfl: 1 .  TRUEPLUS LANCETS 30G MISC, Use one lancet each time sugars are tested. Pt tests twice daily. Dx. E11.9, Disp: 100 each, Rfl: 3  No Known Allergies  Objective:   BP (!) 184/73   Pulse 73   Temp (!) 96.3 F (35.7 C) (Oral)   Ht 5' (1.524 m)   Wt 222 lb (100.7 kg)   LMP 06/25/2008   BMI 43.36 kg/m  AAOx3, NAD NCAT Obese No obvious CN deficits Coloring WNL Pt is able to speak clearly, coherently without shortness of breath or increased work of breathing.  Thought process is linear.  Mood is appropriate.   Assessment and Plan:   HTN- BP remains high.  Has appt w/ HTN Clinic on Monday.  Currently asymptomatic.  SOB/Pulm HTN- Dr Radford Pax has placed a pulmonary referral and she has her appt upcoming.  Hyperlipidemia- chronic problem.  Tolerating statin w/o difficulty.  Check labs.  Adjust meds prn   DM- chronic problem.  Pt doesn't seem to fully grasp that her postprandial sugars will vary based on what she has eaten and that her fasting sugars will be lower.  Tried to review at length fasting vs postprandial and how diet and medication influence both.  Will check A1C and determine if adjustments are needed.    Annye Asa, MD 02/05/2019

## 2019-02-06 ENCOUNTER — Ambulatory Visit (INDEPENDENT_AMBULATORY_CARE_PROVIDER_SITE_OTHER): Payer: Medicare Other

## 2019-02-06 ENCOUNTER — Other Ambulatory Visit: Payer: Self-pay

## 2019-02-06 DIAGNOSIS — I10 Essential (primary) hypertension: Secondary | ICD-10-CM | POA: Diagnosis not present

## 2019-02-06 DIAGNOSIS — E119 Type 2 diabetes mellitus without complications: Secondary | ICD-10-CM | POA: Diagnosis not present

## 2019-02-06 DIAGNOSIS — E785 Hyperlipidemia, unspecified: Secondary | ICD-10-CM

## 2019-02-06 LAB — LIPID PANEL
Cholesterol: 132 mg/dL (ref 0–200)
HDL: 52.5 mg/dL (ref >39.00)
LDL Cholesterol: 59 mg/dL (ref 0–99)
NonHDL: 79.6
Total CHOL/HDL Ratio: 3
Triglycerides: 104 mg/dL (ref 0.0–149.0)
VLDL: 20.8 mg/dL (ref 0.0–40.0)

## 2019-02-06 LAB — CBC WITH DIFFERENTIAL/PLATELET
Basophils Absolute: 0 10*3/uL (ref 0.0–0.1)
Basophils Relative: 0.5 % (ref 0.0–3.0)
Eosinophils Absolute: 0.3 10*3/uL (ref 0.0–0.7)
Eosinophils Relative: 3.4 % (ref 0.0–5.0)
HCT: 37.3 % (ref 36.0–46.0)
Hemoglobin: 12.2 g/dL (ref 12.0–15.0)
Lymphocytes Relative: 23.6 % (ref 12.0–46.0)
Lymphs Abs: 2.2 10*3/uL (ref 0.7–4.0)
MCHC: 32.6 g/dL (ref 30.0–36.0)
MCV: 90.5 fl (ref 78.0–100.0)
Monocytes Absolute: 1.1 10*3/uL — ABNORMAL HIGH (ref 0.1–1.0)
Monocytes Relative: 11.7 % (ref 3.0–12.0)
Neutro Abs: 5.8 10*3/uL (ref 1.4–7.7)
Neutrophils Relative %: 60.8 % (ref 43.0–77.0)
Platelets: 293 10*3/uL (ref 150.0–400.0)
RBC: 4.12 Mil/uL (ref 3.87–5.11)
RDW: 14.8 % (ref 11.5–15.5)
WBC: 9.5 10*3/uL (ref 4.0–10.5)

## 2019-02-06 LAB — BASIC METABOLIC PANEL
BUN: 41 mg/dL — ABNORMAL HIGH (ref 6–23)
CO2: 25 mEq/L (ref 19–32)
Calcium: 10.4 mg/dL (ref 8.4–10.5)
Chloride: 105 mEq/L (ref 96–112)
Creatinine, Ser: 1.06 mg/dL (ref 0.40–1.20)
GFR: 51.73 mL/min — ABNORMAL LOW (ref >60.00)
Glucose, Bld: 215 mg/dL — ABNORMAL HIGH (ref 70–99)
Potassium: 4.8 mEq/L (ref 3.5–5.1)
Sodium: 139 mEq/L (ref 135–145)

## 2019-02-06 LAB — HEPATIC FUNCTION PANEL
ALT: 18 U/L (ref 0–35)
AST: 13 U/L (ref 0–37)
Albumin: 4.4 g/dL (ref 3.5–5.2)
Alkaline Phosphatase: 67 U/L (ref 39–117)
Bilirubin, Direct: 0.1 mg/dL (ref 0.0–0.3)
Total Bilirubin: 0.3 mg/dL (ref 0.2–1.2)
Total Protein: 6.7 g/dL (ref 6.0–8.3)

## 2019-02-06 LAB — TSH: TSH: 2.05 u[IU]/mL (ref 0.35–4.50)

## 2019-02-06 LAB — HEMOGLOBIN A1C: Hgb A1c MFr Bld: 8.1 % — ABNORMAL HIGH (ref 4.6–6.5)

## 2019-02-09 ENCOUNTER — Ambulatory Visit (INDEPENDENT_AMBULATORY_CARE_PROVIDER_SITE_OTHER): Payer: Medicare Other

## 2019-02-09 ENCOUNTER — Other Ambulatory Visit: Payer: Medicare Other | Admitting: *Deleted

## 2019-02-09 ENCOUNTER — Other Ambulatory Visit: Payer: Self-pay

## 2019-02-09 VITALS — BP 158/64 | HR 69

## 2019-02-09 DIAGNOSIS — R0602 Shortness of breath: Secondary | ICD-10-CM

## 2019-02-09 DIAGNOSIS — I10 Essential (primary) hypertension: Secondary | ICD-10-CM

## 2019-02-09 MED ORDER — HYDRALAZINE HCL 25 MG PO TABS: 25.0000 mg | ORAL_TABLET | Freq: Three times a day (TID) | ORAL | 3 refills | Status: DC

## 2019-02-09 NOTE — Progress Notes (Signed)
Patient ID: Kayla MirzaVickie Brown                 DOB: 04/15/1951                      MRN: [id number]     HPI: Kayla Brown is a 68 y.o. female referred by Dr. Mayford Knifeurner to HTN clinic. PMH is significant for HTN, OSA, mild-mod pulmonary HTN, obesity, dyslipidemia. At last visit with Dr. Mayford Knifeurner on 01/22/19, BP was poorly controlled (186/81) on amlodipine, chlorthalidone, spironolactone, and ramipril. Last seen by HTN clinic on 09/20/2016 where spironolactone was added. Patient preferred to have BP checked and medications adjusted by Dr. Mayford Knifeurner, so follow up appointments were made as recommended by Dr. Mayford Knifeurner / other providers in the future.  Patient presents to HTN clinic today for follow up. She reports having shortness of breath during exercise and while sedentary, but says she will be following up with a pulmonary specialist next week. Patient states she has not been able to exercise as normal because of COVID restrictions closing her apartment gym and pool. She says she has a blood pressure monitor at home but isn't sure if it is accurate anymore so she hasn't been checking BP regularly.    Patient denies dizziness, headaches, and swelling.   Current HTN meds: amlodipine 10 mg daily, chlorthalidone 50 mg daily, ramipril 20 mg daily, spironolactone 12.5 mg daily  Previously tried: diltiazem 240 mg daily - discontinued for duplicate therapy by MD, hydrochlorothiazide stopped by MD    BP goal: <130/80  Family History: she was adopted  Social History: Never smoked. Does not drink alcohol.   Diet: very rarely adds extra salt to food, eats some processed food and fast food about 3-4 days per week (for convenience). Drinks coffee, diet sodas, juices (strawberry twist), unsweet tea without added sugar  Exercise: used to do treadmill about 10 mins in apartment complex as well as swimming   Home BP readings: None to report  Wt Readings from Last 3 Encounters:  02/05/19 222 lb (100.7 kg)  02/02/19 227 lb  (103 kg)  01/22/19 227 lb 3.2 oz (103.1 kg)   BP Readings from Last 3 Encounters:  02/05/19 (!) 184/73  01/22/19 (!) 186/81  10/10/18 138/80   Pulse Readings from Last 3 Encounters:  02/05/19 73  01/22/19 77  10/09/18 72    Renal function: Estimated Creatinine Clearance: 55.7 mL/min (by C-G formula based on SCr of 1.06 mg/dL).  Past Medical History:  Diagnosis Date  . Anxiety   . Diabetes mellitus   . Edema of extremities 03/02/2016  . Essential hypertension, benign   . Lazy eye   . Major depressive disorder, single episode, unspecified   . Obesity (BMI 30-39.9) 03/01/2016  . OSA (obstructive sleep apnea)   . Pulmonary HTN (HCC)    mild with PASP 40mmHg by echo 05/2016  . Pure hypercholesterolemia   . Retinopathy, due to hypertension     Current Outpatient Medications on File Prior to Visit  Medication Sig Dispense Refill  . Alcohol Swabs (ALCOHOL PREP) PADS Pt uses an alcohol pad each time sugars are tested. Pt tests twice daily. Dx E11.9 100 each 3  . amLODipine (NORVASC) 10 MG tablet TAKE 1 TABLET BY MOUTH EVERY DAY 90 tablet 1  . atorvastatin (LIPITOR) 40 MG tablet TAKE 1 TABLET BY MOUTH EVERY DAY 90 tablet 1  . chlorthalidone (HYGROTON) 50 MG tablet Take 1 tablet (50 mg total) by mouth daily.  90 tablet 3  . Cholecalciferol (VITAMIN D PO) Take 2,000 Int'l Units by mouth daily.     . fish oil-omega-3 fatty acids 1000 MG capsule Take 1 g by mouth daily.    . fluticasone (FLONASE) 50 MCG/ACT nasal spray SPRAY 2 SPRAYS INTO EACH NOSTRIL EVERY DAY 18 g 1  . glipiZIDE (GLUCOTROL XL) 10 MG 24 hr tablet TAKE 1 TABLET BY MOUTH ONCE DAILY WITH BREAKFAST 90 tablet 0  . glucose blood (FREESTYLE LITE) test strip USE TO TEST BLOOD GLUCOSE 2 TIMES DAILY. Dx E11.9 100 each 12  . loratadine (CLARITIN) 10 MG tablet TAKE 1 TABLET BY MOUTH EVERY DAY 30 tablet 11  . meclizine (ANTIVERT) 25 MG tablet Take 1 tablet (25 mg total) by mouth 3 (three) times daily as needed for dizziness. 30 tablet  0  . MELATONIN PO Take 3 mg by mouth at bedtime.     . metFORMIN (GLUCOPHAGE-XR) 500 MG 24 hr tablet TAKE 2 TABLETS BY MOUTH TWICE DAILY BEFORE A MEAL 360 tablet 0  . Multiple Vitamins-Minerals (VISION FORMULA) TABS Take 1 tablet by mouth every morning.     [name] OVER THE COUNTER MEDICATION Equate Vision Formula 50+    . pioglitazone (ACTOS) 30 MG tablet TAKE 1 TABLET BY MOUTH EVERY DAY 90 tablet 1  . ramipril (ALTACE) 10 MG capsule TAKE 2 CAPSULES BY MOUTH EVERY DAY 180 capsule 0  . sertraline (ZOLOFT) 50 MG tablet TAKE 1 TABLET BY MOUTH EVERY DAY 90 tablet 2  . spironolactone (ALDACTONE) 25 MG tablet TAKE 1/2 TABLET BY MOUTH EVERY DAY 45 tablet 1  . Suvorexant (BELSOMRA) 10 MG TABS Take 10 mg by mouth at bedtime. 30 tablet 1  . TRUEPLUS LANCETS 30G MISC Use one lancet each time sugars are tested. Pt tests twice daily. Dx. E11.9 100 each 3   No current facility-administered medications on file prior to visit.     No Known Allergies  Assessment/Plan:  1. Hypertension - BP today was above goal (<130/80). Start hydralazine 25 mg by mouth three times daily. Continue taking amlodipine 10 mg daily, chlorthalidone 50 mg daily, ramipril 20 mg daily, spironolactone 12.5 mg daily as previously prescribed. Patient was educated about the importance of diet and exercise with lowering BP in addition to taking medications. Encouraged patient if she was unable to change from eating fast food as frequently for the convenience factor, to choose healthier options such as chicken or salads for about 2 of the 3-4 times she eats fast food. Patient seemed very responsive to exercise options discussed that she can do at home, such as walking around the apartment, climbing stairs as tolerated, and leg strengthening exercises.   Instructed patient to bring BP monitor and log from home to next visit so we can test the accuracy of her machine against BP in office. Encouraged to check blood pressure a few days per week 1-2  hours after taking morning medications.   Follow up BP in 3 weeks with the HTN clinic.  Vertis Kelch, PharmD PGY2 Cardiology Pharmacy Resident [facility] [address] [place], [place] [address] Phone: [phone]; Fax: [phone]

## 2019-02-10 LAB — BASIC METABOLIC PANEL
BUN/Creatinine Ratio: 25 (ref 12–28)
BUN: 32 mg/dL — ABNORMAL HIGH (ref 8–27)
CO2: 22 mmol/L (ref 20–29)
Calcium: 10.9 mg/dL — ABNORMAL HIGH (ref 8.7–10.3)
Chloride: 105 mmol/L (ref 96–106)
Creatinine, Ser: 1.28 mg/dL — ABNORMAL HIGH (ref 0.57–1.00)
GFR calc Af Amer: 50 mL/min/{1.73_m2} — ABNORMAL LOW (ref >59)
GFR calc non Af Amer: 44 mL/min/{1.73_m2} — ABNORMAL LOW (ref >59)
Glucose: 83 mg/dL (ref 65–99)
Potassium: 5.1 mmol/L (ref 3.5–5.2)
Sodium: 142 mmol/L (ref 134–144)

## 2019-02-11 ENCOUNTER — Telehealth: Payer: Self-pay | Admitting: Pharmacist

## 2019-02-11 DIAGNOSIS — I10 Essential (primary) hypertension: Secondary | ICD-10-CM

## 2019-02-11 NOTE — Telephone Encounter (Signed)
Spoke with patient, advised her to decrease chlorthalidone to 25mg  daily (1/2) tab. Repeat BMP in 1 week. Patient would like to get this done at PCP office. Called PCP office and coordinated this. Follow up in BP clinic on 9/8

## 2019-02-13 ENCOUNTER — Ambulatory Visit: Payer: Medicare Other | Admitting: Family Medicine

## 2019-02-14 ENCOUNTER — Other Ambulatory Visit: Payer: Self-pay | Admitting: Family Medicine

## 2019-02-16 ENCOUNTER — Encounter: Payer: Self-pay | Admitting: Internal Medicine

## 2019-02-16 ENCOUNTER — Ambulatory Visit (INDEPENDENT_AMBULATORY_CARE_PROVIDER_SITE_OTHER): Payer: Medicare Other | Admitting: Internal Medicine

## 2019-02-16 ENCOUNTER — Other Ambulatory Visit: Payer: Self-pay

## 2019-02-16 VITALS — BP 150/62 | HR 82 | Temp 97.5°F | Ht 60.0 in | Wt 226.8 lb

## 2019-02-16 DIAGNOSIS — R0609 Other forms of dyspnea: Secondary | ICD-10-CM | POA: Diagnosis not present

## 2019-02-16 DIAGNOSIS — G4733 Obstructive sleep apnea (adult) (pediatric): Secondary | ICD-10-CM

## 2019-02-16 DIAGNOSIS — Z23 Encounter for immunization: Secondary | ICD-10-CM

## 2019-02-16 MED ORDER — ANORO ELLIPTA 62.5-25 MCG/INH IN AEPB: 1.0000 | INHALATION_SPRAY | Freq: Every day | RESPIRATORY_TRACT | 0 refills | Status: DC

## 2019-02-16 NOTE — Patient Instructions (Addendum)
Gradually increase your physical activity- regular walking and weight loss  Sample Anoro inhaler      Inhale 1 puff, once daily   See if it helps your breathing  Order- CXR   Dx dypnea on exertion  Order-flu vax- senior  Order- schedule PFT     dyspnea on exertion  Please call if we can help

## 2019-02-16 NOTE — Progress Notes (Signed)
02/16/2019- 67 yoF never smoker for pulmonary evaluation of dyspnea, referred by Dr. Mayford Knifeurner (Cardiologist) for OSA; pt states she is currently on a CPAP machine, DME: [employer] [facility] problem list includes anxiety and depression, DM2, HBP, Hyperlipidemia, Morbid obesity, OSA, Pulmonary Hypertension(PASP 43mmHg by echo 02/2015) ,  She had OSA evaluation with good CPAP compliance per Dr Mayford Knifeurner. Dyspnea was evaluated by Echo and Myoscan and thought likely due to obesity, for which weight loss/ exercise was recommended.  NPSG 05/14/16 AHI 93/ hr, desaturation to 84% PFT 04/04/15   - mild Diffusion defect, normal flows and volumes without response to bronchodilator Denies cough or wheeze. Lives sin apartment- DOE with ADLs, walking, errands. She says cardiology told her Pulm Hypertension is gone. She denies prior hx lung disease, doubts ever had pneumonia, PE, asthma. She is adopted- fam hx unknown.  Prior to Admission medications   Medication Sig Start Date End Date Taking? Authorizing Provider  Alcohol Swabs (ALCOHOL PREP) PADS Pt uses an alcohol pad each time sugars are tested. Pt tests twice daily. Dx E11.9 02/06/17  Yes Sheliah Hatchabori, Katherine E, MD  atorvastatin (LIPITOR) 40 MG tablet TAKE 1 TABLET BY MOUTH EVERY DAY Patient taking differently: Take 40 mg by mouth daily at 6 PM.  12/01/18  Yes Sheliah Hatchabori, Katherine E, MD  chlorthalidone (HYGROTON) 50 MG tablet Take 1 tablet (50 mg total) by mouth daily. Patient taking differently: Take 25 mg by mouth daily.  01/22/19  Yes Turner, Cornelious Bryantraci R, MD  Cholecalciferol (VITAMIN D PO) Take 2,000 Units by mouth daily.    Yes [provider]  fish oil-omega-3 fatty acids 1000 MG capsule Take 1 g by mouth daily.   Yes [provider]  fluticasone (FLONASE) 50 MCG/ACT nasal spray SPRAY 2 SPRAYS INTO EACH NOSTRIL EVERY DAY Patient taking differently: Place 2 sprays into both nostrils daily.  03/26/18  Yes Sheliah Hatchabori, Katherine E, MD  glucose blood (FREESTYLE LITE)  test strip USE TO TEST BLOOD GLUCOSE 2 TIMES DAILY. Dx E11.9 10/16/17  Yes Sheliah Hatchabori, Katherine E, MD  hydrALAZINE (APRESOLINE) 25 MG tablet Take 1 tablet (25 mg total) by mouth 3 (three) times daily. 02/09/19  Yes Tonny Bollmanooper, Michael, MD  loratadine (CLARITIN) 10 MG tablet TAKE 1 TABLET BY MOUTH EVERY DAY Patient taking differently: Take 10 mg by mouth daily.  02/06/18  Yes Sheliah Hatchabori, Katherine E, MD  meclizine (ANTIVERT) 25 MG tablet Take 1 tablet (25 mg total) by mouth 3 (three) times daily as needed for dizziness. 10/02/16  Yes Sheliah Hatchabori, Katherine E, MD  metFORMIN (GLUCOPHAGE-XR) 500 MG 24 hr tablet TAKE 2 TABLETS BY MOUTH TWICE DAILY BEFORE A MEAL Patient taking differently: Take 1,000 mg by mouth 2 (two) times daily.  01/06/19  Yes Sheliah Hatchabori, Katherine E, MD  Multiple Vitamins-Minerals (VISION FORMULA) TABS Take 1 tablet by mouth daily.    Yes [provider]  OVER THE COUNTER MEDICATION Take 1 tablet by mouth daily. Equate Vision Formula 50+    Yes [provider]  ramipril (ALTACE) 10 MG capsule TAKE 2 CAPSULES BY MOUTH EVERY DAY Patient taking differently: Take 20 mg by mouth daily.  01/06/19  Yes Sheliah Hatchabori, Katherine E, MD  sertraline (ZOLOFT) 50 MG tablet TAKE 1 TABLET BY MOUTH EVERY DAY Patient taking differently: Take 50 mg by mouth daily.  02/16/19  Yes Sheliah Hatchabori, Katherine E, MD  spironolactone (ALDACTONE) 25 MG tablet TAKE 1/2 TABLET BY MOUTH EVERY DAY Patient taking differently: Take 12.5 mg by mouth daily.  01/19/19  Yes Neena Rhymesabori, Katherine  E, MD  TRUEPLUS LANCETS 30G MISC Use one lancet each time sugars are tested. Pt tests twice daily. Dx. E11.9 02/06/17  Yes [name], MD  amLODipine (NORVASC) 10 MG tablet TAKE 1 TABLET BY MOUTH EVERY DAY Patient taking differently: Take 10 mg by mouth daily.  02/23/19   [name], MD  amoxicillin-clavulanate (AUGMENTIN) 875-125 MG tablet Take 1 tablet by mouth 2 (two) times daily for 7 days. 02/24/19 03/03/28  Parrett, Fonnie Mu, NP   empagliflozin (JARDIANCE) 10 MG TABS tablet Take 10 mg by mouth daily before breakfast. 02/17/19   [name], MD  glipiZIDE (GLUCOTROL XL) 10 MG 24 hr tablet TAKE 1 TABLET BY MOUTH ONCE DAILY WITH BREAKFAST Patient taking differently: Take 10 mg by mouth daily with breakfast.  02/23/19   [name], MD  hydrochlorothiazide (MICROZIDE) 12.5 MG capsule Take 12.5 mg by mouth 2 (two) times daily. 02/23/19   [provider]  Melatonin 3 MG TABS Take 3 mg by mouth at bedtime.    [provider]  pioglitazone (ACTOS) 30 MG tablet TAKE 1 TABLET BY MOUTH EVERY DAY Patient taking differently: Take 30 mg by mouth daily.  02/23/19   [name], MD  Suvorexant (BELSOMRA) 10 MG TABS Take 10 mg by mouth at bedtime. Patient not taking: Reported on 02/16/2019 04/01/18   Brunetta Jeans, PA-C  umeclidinium-vilanterol [facility] ELLIPTA) 62.5-25 MCG/INH AEPB Inhale 1 puff into the lungs daily. 02/16/19   Deneise Lever, MD   Past Medical History:  Diagnosis Date  . Anxiety   . Diabetes mellitus   . Edema of extremities 03/02/2016  . Essential hypertension, benign   . Lazy eye   . Major depressive disorder, single episode, unspecified   . Obesity (BMI 30-39.9) 03/01/2016  . OSA (obstructive sleep apnea)   . Pulmonary HTN ([place])    mild with PASP 12mmHg by echo 05/2016  . Pure hypercholesterolemia   . Retinopathy, due to hypertension    Past Surgical History:  Procedure Laterality Date  . CPAP TITRATION  10/14/2015  . DILATION AND CURETTAGE OF UTERUS  2005-2008   x2  . EYE SURGERY     cataracts  . WISDOM TOOTH EXTRACTION     Family History  Adopted: Yes  Problem Relation Age of Onset  . Alcohol abuse Mother   . ADD / ADHD Mother   . Dementia Mother   . Breast cancer Neg Hx    Social History   Socioeconomic History  . Marital status: Married    Spouse name: Not on file  . Number of children: Not on file  . Years of education: Not on file  . Highest  education level: Not on file  Occupational History  . Not on file  Social Needs  . Financial resource strain: Not on file  . Food insecurity    Worry: Not on file    Inability: Not on file  . Transportation needs    Medical: Not on file    Non-medical: Not on file  Tobacco Use  . Smoking status: Never Smoker  . Smokeless tobacco: Never Used  Substance and Sexual Activity  . Alcohol use: No    Alcohol/week: 0.0 standard drinks  . Drug use: No  . Sexual activity: Yes    Partners: Male    Birth control/protection: None  Lifestyle  . Physical activity    Days per week: Not on file    Minutes per session: Not on  file  . Stress: Not on file  Relationships  . Social Musicianconnections    Talks on phone: Not on file    Gets together: Not on file    Attends religious service: Not on file    Active member of club or organization: Not on file    Attends meetings of clubs or organizations: Not on file    Relationship status: Not on file  . Intimate partner violence    Fear of current or ex partner: Not on file    Emotionally abused: Not on file    Physically abused: Not on file    Forced sexual activity: Not on file  Other Topics Concern  . Not on file  Social History Narrative  . Not on file   ROS-see HPI   + = positive Constitutional:    weight loss, night sweats, fevers, chills, fatigue, lassitude. HEENT:    headaches, difficulty swallowing, tooth/dental problems, sore throat,       sneezing, itching, ear ache, nasal congestion, post nasal drip, snoring CV:    chest pain, orthopnea, PND, swelling in lower extremities, anasarca,                                  dizziness, palpitations Resp:   +shortness of breath with exertion or at rest.                productive cough,   non-productive cough, coughing up of blood.              change in color of mucus.  wheezing.   Skin:    rash or lesions. GI:  No-   heartburn, indigestion, abdominal pain, nausea, vomiting, diarrhea,                  change in bowel habits, loss of appetite GU: dysuria, change in color of urine, no urgency or frequency.   flank pain. MS:   joint pain, stiffness, decreased range of motion, back pain. Neuro-     nothing unusual Psych:  change in mood or affect.  depression or anxiety.   memory loss.  OBJ- Physical Exam General- Alert, Oriented, Affect-appropriate, Distress- none acute, + obese Skin- rash-none, lesions- none, excoriation- none Lymphadenopathy- none Head- atraumatic            Eyes- +strabismus            Ears- Hearing, canals-normal            Nose- Clear, no-Septal dev, mucus, polyps, erosion, perforation             Throat- Mallampati II , mucosa clear , drainage- none, tonsils- atrophic Neck- flexible , trachea midline, no stridor , thyroid nl, carotid no bruit Chest - symmetrical excursion , unlabored           Heart/CV- RRR , no murmur , no gallop  , no rub, nl s1 s2                           - JVD- none , edema- none, stasis changes- none, varices- none           Lung- clear to P&A, wheeze- none, cough- none , dullness-none, rub- none           Chest wall-  Abd-  Br/ Gen/ Rectal- Not done, not indicated Extrem- cyanosis- none, clubbing, none, atrophy-  none, strength- nl Neuro- grossly intact to observation

## 2019-02-17 ENCOUNTER — Other Ambulatory Visit: Payer: Self-pay

## 2019-02-17 ENCOUNTER — Ambulatory Visit (INDEPENDENT_AMBULATORY_CARE_PROVIDER_SITE_OTHER): Payer: Medicare Other

## 2019-02-17 ENCOUNTER — Telehealth: Payer: Self-pay

## 2019-02-17 DIAGNOSIS — R918 Other nonspecific abnormal finding of lung field: Secondary | ICD-10-CM | POA: Diagnosis not present

## 2019-02-17 DIAGNOSIS — R0609 Other forms of dyspnea: Secondary | ICD-10-CM

## 2019-02-17 MED ORDER — JARDIANCE 10 MG PO TABS: 10.0000 mg | ORAL_TABLET | Freq: Every day | ORAL | 3 refills | Status: DC

## 2019-02-17 NOTE — Telephone Encounter (Signed)
Called patient to go over lab results per PCP. Patient stated that her pharmacy informed her that she is overdue for a tetanus shot and that she never finished her Shingles vaccine series in 2013. I informed patient that the shingles vaccine will have to be a prescription sent to the pharmacy instead of done in our office due to her insurance being Medicare. I let patient know that Medicare will only cover tetanus shots in our office if patient has a cut or open wound. After talking to PCP, patient will have to be given tetanus shot at her pharmacy, but insurance may or may not cover the cost. Will advise patient of all information tomorrow at lab only visit per her request.

## 2019-02-18 ENCOUNTER — Ambulatory Visit (INDEPENDENT_AMBULATORY_CARE_PROVIDER_SITE_OTHER): Payer: Medicare Other

## 2019-02-18 ENCOUNTER — Other Ambulatory Visit: Payer: Self-pay

## 2019-02-18 ENCOUNTER — Ambulatory Visit: Payer: Medicare Other | Admitting: Family Medicine

## 2019-02-18 DIAGNOSIS — I10 Essential (primary) hypertension: Secondary | ICD-10-CM | POA: Diagnosis not present

## 2019-02-18 NOTE — Telephone Encounter (Signed)
I do recommend Shingrix for all patients HOWEVER this shot leads to flu like sxs and given COVID, would hold off at this time.  She can get a tetanus at her pharmacy w/o a prescription

## 2019-02-18 NOTE — Telephone Encounter (Signed)
Called and advised pt. She expressed an understanding.

## 2019-02-19 ENCOUNTER — Telehealth: Payer: Self-pay

## 2019-02-19 LAB — SPECIMEN STATUS

## 2019-02-19 NOTE — Telephone Encounter (Signed)
Spoke with the pt re: her lab results and she reports that her BP has still been running high at home 160's/80's.... but she does not have any readings recorded.. she says at Dr. Janee Morn office 02/16/19 her BP was 150/62.   Pt says she did miss a dose of her Chlorthalidone 2 days ago but is trying to not let that happen again... she had a very busy day that day.   Pt says she is feeling well. She is unsure if her BP cuff at home is working properly.. she will bring it to her appt with the [facility]/ HTN clinic on 03/03/19. I have advised her to start checking her BP after sitting for several minutes with both feet on the ground and to record it on paper for her visits. Pt verbalized understanding and agreed.    Will forward to Dr. Radford Pax for review.

## 2019-02-19 NOTE — Telephone Encounter (Signed)
-----   Message from Sueanne Margarita, MD sent at 02/19/2019  8:31 AM EDT ----- Creatinine improved with lower dose of Chlorthalidone - please find out what her BP has been running

## 2019-02-19 NOTE — Telephone Encounter (Signed)
Advised pt of Dr. Montez Morita request to begin recording her BP/HR readings to bring to her HTN clinic appt/ Pt agreeable.

## 2019-02-19 NOTE — Telephone Encounter (Signed)
Have her bring her BP readings to her HTN clinic appt

## 2019-02-21 LAB — BASIC METABOLIC PANEL
BUN/Creatinine Ratio: 38 — ABNORMAL HIGH (ref 12–28)
BUN: 46 mg/dL — ABNORMAL HIGH (ref 8–27)
CO2: 19 mmol/L — ABNORMAL LOW (ref 20–29)
Calcium: 10.2 mg/dL (ref 8.7–10.3)
Chloride: 103 mmol/L (ref 96–106)
Creatinine, Ser: 1.2 mg/dL — ABNORMAL HIGH (ref 0.57–1.00)
GFR calc Af Amer: 54 mL/min/{1.73_m2} — ABNORMAL LOW (ref >59)
GFR calc non Af Amer: 47 mL/min/{1.73_m2} — ABNORMAL LOW (ref >59)
Glucose: 112 mg/dL — ABNORMAL HIGH (ref 65–99)
Potassium: 4.9 mmol/L (ref 3.5–5.2)
Sodium: 139 mmol/L (ref 134–144)

## 2019-02-22 ENCOUNTER — Other Ambulatory Visit: Payer: Self-pay | Admitting: Family Medicine

## 2019-02-23 ENCOUNTER — Other Ambulatory Visit: Payer: Self-pay | Admitting: Family Medicine

## 2019-02-24 ENCOUNTER — Telehealth: Payer: Self-pay | Admitting: Internal Medicine

## 2019-02-24 DIAGNOSIS — R0609 Other forms of dyspnea: Secondary | ICD-10-CM

## 2019-02-24 MED ORDER — AMOXICILLIN-POT CLAVULANATE 875-125 MG PO TABS: 1.0000 | ORAL_TABLET | Freq: Two times a day (BID) | ORAL | 0 refills | Status: DC

## 2019-02-24 NOTE — Telephone Encounter (Signed)
Called and spoke with pt. Pt states she viewed her CXR from 02/18/2019 on MyChart and saw the impression as "pneumonia" and is now concerned about where to go from here. This CXR has not been resulted by a provider, so I let her know I would get a message routed to our provider on call for results and recommendations. Pt states the Anoro inhaler she was previously prescribed at her LOV 02/16/2019 is not working well for her. She also inquired about getting a PFT done. I let her know PFT scheduling is slightly behind d/t COVID protocols; however, we can definitely look into that once her CXR has been evaluated. Pt expressed understanding.   Since CY is not currently in the office, I am routing this message to our provider of the day. TP, please advise with your results and recommendations for this pt regarding her CXR 02/17/2019. Thank you.

## 2019-02-24 NOTE — Telephone Encounter (Signed)
Called the patient and advised her of the recommendations received. Patient agreed to pick up the antibiotics and has been scheduled for chest xray and OV with Rexene Edison, NP on 03/05/19. Patient voiced understanding. Nothing further needed at this time.

## 2019-02-24 NOTE — Telephone Encounter (Signed)
Saw Dr. Annamaria Boots  Last week on 02/18/19 for pulmonary consult for dyspnea and mild cough   CXR showed patchy RML opacity ? PNA   Patient called for results   NKDA  No abx >2-3 yrs  Minimal smoking hx  DM , no dysphagia   Augmentin 875mg  Twice daily  For 7 days , take with food.  Fluids and rest .   Follow up in 1 week with chest xray .   Please contact office for sooner follow up if symptoms do not improve or worsen or seek emergency care    abx has been sent   Please call patient and set up follow up ov with me next WEdnesday in office with cxr

## 2019-02-26 ENCOUNTER — Telehealth: Payer: Self-pay | Admitting: Adult Health

## 2019-02-26 DIAGNOSIS — R0609 Other forms of dyspnea: Secondary | ICD-10-CM

## 2019-02-26 NOTE — Telephone Encounter (Signed)
Returned call to patient with Dr. Janee Morn recommendations. Patient has already been taking Augmentin with food but will follow his instructions and reduce dose if GI symptoms continue.  Patient agreed to covid testing and order placed.  She will not be able to get testing today due to after 4:30pm and no weekend hours available.  Will plan to get test early on Monday.  Advised to go to ED if symptoms worsen.    Also instructed patient to call us before coming in for her OV next week because we will need to make sure her covid test is negative so if we have not contacted her Wednesday regarding results then she would need to check with our office before coming in for appointment Thursday.  We may need to move appt if test results pending.  Patient acknowledged understanding.  Nothing further needed at this time.

## 2019-02-26 NOTE — Telephone Encounter (Signed)
Spoke with pt, she states she started running a fever this afternoon. She doesn't have an appetite and her stomach is upset. She started her ABX yesterday and just took some ibuprofen. She feels some fatigue but overall not feeling well.. She does have an appt with TP on 03/05/2019 to follow up with xray. FYI CY

## 2019-02-26 NOTE — Telephone Encounter (Signed)
Suggest she take her augmentin antibiotic with food. If stomach stays upset, reduce augmentin to 1 tab daily to see if that helps. Suggest we order Covid nasal swab now since it is almost the weekend If gets worse over weekend, go to ER Keep appointment with TP next week

## 2019-03-02 NOTE — Progress Notes (Signed)
Patient ID: Kayla Brown                 DOB: [birth year]                      MRN: [id number]     Kayla Brown is a 68 y.o. female referred by Dr. Mayford Knife to HTN clinic. PMH is significant for HTN, OSA, mild-mod pulmonary HTN, obesity, dyslipidemia. At last visit with Dr. Mayford Knife on 01/22/19, BP was poorly controlled (186/81) on amlodipine, chlorthalidone, spironolactone, and ramipril. Patient preferred to have BP checked and medications adjusted by Dr. Mayford Knife, so follow up appointments were made as recommended by Dr. Mayford Knife / other providers in the future. Last appt (02/09/19) with HTN clinic, hydralazine 25 mg TID was initiated.  Patient contacted via telephone for virtual HTN appt. Pt is concerned regarding "real high" blood pressure readings. She has been experiencing a "bad headache" everyday, which variates between dull/achey and sharp pain. Pt's home BP are all elevated ranging between 160-190s systolic. She states she takes ibuprofen 600 mg daily which provides headache relief for a few hours, however, the headache usually returns. Pt also experiencing confusion. Pt denies numbness, facial drooping, slurred speech, and chest pain at this time.  Pt is able to verbally state names, doses, and administration times of her HTN medications. She confirms she decreased her dose of chlorthalidone d/t recent BMP and initiated hydralazine. She is tolerating both medications. Pt is not experiencing s/sx of hypotension.  Current HTN meds: amlodipine 10 mg daily, chlorthalidone 25 mg daily, ramipril 20 mg daily, spironolactone 12.5 mg daily, hydralazine 25 mg TID Previously tried: diltiazem 240 mg daily - discontinued for duplicate therapy by MD, hydrochlorothiazide stopped by MD    BP goal: <130/80  Family History: she was adopted  Social History: Never smoked. Does not drink alcohol.  Diet: very rarely adds extra salt to food, eats some processed food and fast food about 3-4 days per week (for  convenience). Drinks coffee, diet sodas, juices (strawberry twist), unsweet tea without added sugar  Exercise: used to do treadmill about 10 mins in apartment complex as well as swimming   Home BP readings:  Takes BP meds ~8:30 AM and ~4:30-5:00 PM; Takes hydralazine also at 1PM. Date Time Blood Pressure (mmHg) Pulse 8/27 2:00PM 187/66 86 8/28 2:00PM 183/70 83 8/29 2:15PM 162/63 78 8/29 5:00 PM 190/65 78 8/30 11:15AM 179/56 78 9/1 3:40PM 179/69 79 9/3 Unknown Time 194/70 89  9/4 2:15 PM 160/64 76 9/5 11:45 AM 168/60 76  9/6 2:10 PM 185/58 89  9/7 5:25PM 173/57 88 9/8 3:20AM 174/63 79 9/8 1:00PM 197/64 88  Wt Readings from Last 3 Encounters:  02/16/19 226 lb 12.8 oz (102.9 kg)  02/05/19 222 lb (100.7 kg)  02/02/19 227 lb (103 kg)   BP Readings from Last 3 Encounters:  02/16/19 (!) 150/62  02/09/19 (!) 158/64  02/05/19 (!) 184/73   Pulse Readings from Last 3 Encounters:  02/16/19 82  02/09/19 69  02/05/19 73    Renal function: CrCl cannot be calculated (Unknown ideal weight.).  Past Medical History:  Diagnosis Date  . Anxiety   . Diabetes mellitus   . Edema of extremities 03/02/2016  . Essential hypertension, benign   . Lazy eye   . Major depressive disorder, single episode, unspecified   . Obesity (BMI 30-39.9) 03/01/2016  . OSA (obstructive sleep apnea)   . Pulmonary HTN (HCC)    mild with PASP by  echo 05/2016  . Pure hypercholesterolemia   . Retinopathy, due to hypertension     Current Outpatient Medications on File Prior to Visit  Medication Sig Dispense Refill  . Alcohol Swabs (ALCOHOL PREP) PADS Pt uses an alcohol pad each time sugars are tested. Pt tests twice daily. Dx E11.9 100 each 3  . amLODipine (NORVASC) 10 MG tablet TAKE 1 TABLET BY MOUTH EVERY DAY 90 tablet 1  . amoxicillin-clavulanate (AUGMENTIN) 875-125 MG tablet Take 1 tablet by mouth 2 (two) times daily for 7 days. 14 tablet 0  . atorvastatin (LIPITOR) 40 MG tablet TAKE 1 TABLET BY  MOUTH EVERY DAY 90 tablet 1  . chlorthalidone (HYGROTON) 50 MG tablet Take 1 tablet (50 mg total) by mouth daily. (Patient taking differently: Take 50 mg by mouth daily. Pt reports taking 1/2 tablet daily on 02/16/2019 //edw) 90 tablet 3  . Cholecalciferol (VITAMIN D PO) Take 2,000 Int'l Units by mouth daily.     . empagliflozin (JARDIANCE) 10 MG TABS tablet Take 10 mg by mouth daily before breakfast. 30 tablet 3  . fish oil-omega-3 fatty acids 1000 MG capsule Take 1 g by mouth daily.    . fluticasone (FLONASE) 50 MCG/ACT nasal spray SPRAY 2 SPRAYS INTO EACH NOSTRIL EVERY DAY 18 g 1  . glipiZIDE (GLUCOTROL XL) 10 MG 24 hr tablet TAKE 1 TABLET BY MOUTH ONCE DAILY WITH BREAKFAST 90 tablet 0  . glucose blood (FREESTYLE LITE) test strip USE TO TEST BLOOD GLUCOSE 2 TIMES DAILY. Dx E11.9 100 each 12  . hydrALAZINE (APRESOLINE) 25 MG tablet Take 1 tablet (25 mg total) by mouth 3 (three) times daily. 90 tablet 3  . loratadine (CLARITIN) 10 MG tablet TAKE 1 TABLET BY MOUTH EVERY DAY 30 tablet 11  . meclizine (ANTIVERT) 25 MG tablet Take 1 tablet (25 mg total) by mouth 3 (three) times daily as needed for dizziness. 30 tablet 0  . Melatonin 10 MG CAPS Take 10 mg by mouth at bedtime.    [name] MELATONIN PO Take 3 mg by mouth at bedtime.     . metFORMIN (GLUCOPHAGE-XR) 500 MG 24 hr tablet TAKE 2 TABLETS BY MOUTH TWICE DAILY BEFORE A MEAL 360 tablet 0  . Multiple Vitamins-Minerals (VISION FORMULA) TABS Take 1 tablet by mouth every morning.     [name] OVER THE COUNTER MEDICATION Equate Vision Formula 50+    . pioglitazone (ACTOS) 30 MG tablet TAKE 1 TABLET BY MOUTH EVERY DAY 90 tablet 1  . ramipril (ALTACE) 10 MG capsule TAKE 2 CAPSULES BY MOUTH EVERY DAY 180 capsule 0  . sertraline (ZOLOFT) 50 MG tablet TAKE 1 TABLET BY MOUTH EVERY DAY 90 tablet 2  . spironolactone (ALDACTONE) 25 MG tablet TAKE 1/2 TABLET BY MOUTH EVERY DAY 45 tablet 1  . Suvorexant (BELSOMRA) 10 MG TABS Take 10 mg by mouth at bedtime. (Patient not  taking: Reported on 02/16/2019) 30 tablet 1  . TRUEPLUS LANCETS 30G MISC Use one lancet each time sugars are tested. Pt tests twice daily. Dx. E11.9 100 each 3  . umeclidinium-vilanterol (ANORO ELLIPTA) 62.5-25 MCG/INH AEPB Inhale 1 puff into the lungs daily. 1 each 0   No current facility-administered medications on file prior to visit.     No Known Allergies   Assessment/Plan:  1. Hypertension - goal <130/80 mmHg. Pt's blood remains significantly elevated above goal. Informed pt that headache is likely related to elevated BP readings. Thoroughly assessed adherence to medications - it appears pt is currently taking  all HTN meds as prescribed. Discussed with pt the option of increasing hydralazine dose to 50 mg TID and the importance of pursuing ED admission if she experiences s/sx of stroke or MI. Pt is frustrated that she is taking BP medications as prescribed without any effects on her BP readings as well as experiencing persistent headache/confusion. Pt is not interested in increasing hydralazine dose at this time and would prefer to go to the ED to get evaluated. Plan to f/u with pt once she is D/C from ED.   Thank you for involving pharmacy to assist in providing Mrs. Traywick's care.   Zachery ConchMary Leocadia Idleman, PharmD PGY2 Ambulatory Care Pharmacy Resident

## 2019-03-02 NOTE — Patient Instructions (Addendum)
It was a pleasure seeing you in clinic today Kayla Brown!  Today the plan is...  Please call the PharmD clinic at [phone] if you have any questions that you would like to speak with a pharmacist about Kayla Brown, Kayla Brown, Kayla Brown).

## 2019-03-03 ENCOUNTER — Inpatient Hospital Stay (HOSPITAL_COMMUNITY)
Admission: EM | Admit: 2019-03-03 | Discharge: 2019-03-16 | DRG: 291 | Disposition: A | Discharge status: Home Health | Payer: Medicare Other | Attending: Internal Medicine | Admitting: Internal Medicine

## 2019-03-03 ENCOUNTER — Telehealth (INDEPENDENT_AMBULATORY_CARE_PROVIDER_SITE_OTHER): Payer: Self-pay | Admitting: Pharmacist

## 2019-03-03 ENCOUNTER — Telehealth: Payer: Self-pay | Admitting: Adult Health

## 2019-03-03 ENCOUNTER — Ambulatory Visit: Payer: Medicare Other

## 2019-03-03 ENCOUNTER — Other Ambulatory Visit: Payer: Self-pay

## 2019-03-03 ENCOUNTER — Emergency Department (HOSPITAL_COMMUNITY): Payer: Medicare Other

## 2019-03-03 VITALS — BP 197/64

## 2019-03-03 DIAGNOSIS — R0902 Hypoxemia: Secondary | ICD-10-CM | POA: Diagnosis not present

## 2019-03-03 DIAGNOSIS — I10 Essential (primary) hypertension: Secondary | ICD-10-CM | POA: Diagnosis not present

## 2019-03-03 DIAGNOSIS — N179 Acute kidney failure, unspecified: Secondary | ICD-10-CM | POA: Diagnosis not present

## 2019-03-03 DIAGNOSIS — G4700 Insomnia, unspecified: Secondary | ICD-10-CM | POA: Diagnosis present

## 2019-03-03 DIAGNOSIS — J9811 Atelectasis: Secondary | ICD-10-CM | POA: Diagnosis not present

## 2019-03-03 DIAGNOSIS — E7800 Pure hypercholesterolemia, unspecified: Secondary | ICD-10-CM | POA: Diagnosis present

## 2019-03-03 DIAGNOSIS — N183 Chronic kidney disease, stage 3 (moderate): Secondary | ICD-10-CM | POA: Diagnosis present

## 2019-03-03 DIAGNOSIS — J810 Acute pulmonary edema: Secondary | ICD-10-CM

## 2019-03-03 DIAGNOSIS — I130 Hypertensive heart and chronic kidney disease with heart failure and stage 1 through stage 4 chronic kidney disease, or unspecified chronic kidney disease: Secondary | ICD-10-CM | POA: Diagnosis not present

## 2019-03-03 DIAGNOSIS — I5033 Acute on chronic diastolic (congestive) heart failure: Secondary | ICD-10-CM | POA: Diagnosis not present

## 2019-03-03 DIAGNOSIS — E11319 Type 2 diabetes mellitus with unspecified diabetic retinopathy without macular edema: Secondary | ICD-10-CM | POA: Diagnosis present

## 2019-03-03 DIAGNOSIS — I272 Pulmonary hypertension, unspecified: Secondary | ICD-10-CM | POA: Diagnosis present

## 2019-03-03 DIAGNOSIS — R0602 Shortness of breath: Secondary | ICD-10-CM

## 2019-03-03 DIAGNOSIS — J9601 Acute respiratory failure with hypoxia: Secondary | ICD-10-CM | POA: Diagnosis present

## 2019-03-03 DIAGNOSIS — J811 Chronic pulmonary edema: Secondary | ICD-10-CM

## 2019-03-03 DIAGNOSIS — E1122 Type 2 diabetes mellitus with diabetic chronic kidney disease: Secondary | ICD-10-CM | POA: Diagnosis present

## 2019-03-03 DIAGNOSIS — Z20828 Contact with and (suspected) exposure to other viral communicable diseases: Secondary | ICD-10-CM | POA: Diagnosis not present

## 2019-03-03 DIAGNOSIS — I959 Hypotension, unspecified: Secondary | ICD-10-CM | POA: Diagnosis not present

## 2019-03-03 DIAGNOSIS — T502X5A Adverse effect of carbonic-anhydrase inhibitors, benzothiadiazides and other diuretics, initial encounter: Secondary | ICD-10-CM | POA: Diagnosis present

## 2019-03-03 DIAGNOSIS — E869 Volume depletion, unspecified: Secondary | ICD-10-CM | POA: Diagnosis present

## 2019-03-03 DIAGNOSIS — N390 Urinary tract infection, site not specified: Secondary | ICD-10-CM | POA: Diagnosis present

## 2019-03-03 DIAGNOSIS — Z79899 Other long term (current) drug therapy: Secondary | ICD-10-CM

## 2019-03-03 DIAGNOSIS — Z7984 Long term (current) use of oral hypoglycemic drugs: Secondary | ICD-10-CM

## 2019-03-03 DIAGNOSIS — G4733 Obstructive sleep apnea (adult) (pediatric): Secondary | ICD-10-CM | POA: Diagnosis present

## 2019-03-03 DIAGNOSIS — E1165 Type 2 diabetes mellitus with hyperglycemia: Secondary | ICD-10-CM | POA: Diagnosis present

## 2019-03-03 DIAGNOSIS — J449 Chronic obstructive pulmonary disease, unspecified: Secondary | ICD-10-CM | POA: Diagnosis present

## 2019-03-03 LAB — COMPREHENSIVE METABOLIC PANEL
ALT: 38 U/L (ref 0–44)
AST: 20 U/L (ref 15–41)
Albumin: 3 g/dL — ABNORMAL LOW (ref 3.5–5.0)
Alkaline Phosphatase: 56 U/L (ref 38–126)
Anion gap: 11 (ref 5–15)
BUN: 53 mg/dL — ABNORMAL HIGH (ref 8–23)
CO2: 18 mmol/L — ABNORMAL LOW (ref 22–32)
Calcium: 9.4 mg/dL (ref 8.9–10.3)
Chloride: 108 mmol/L (ref 98–111)
Creatinine, Ser: 1.79 mg/dL — ABNORMAL HIGH (ref 0.44–1.00)
GFR calc Af Amer: 34 mL/min — ABNORMAL LOW (ref >60)
GFR calc non Af Amer: 29 mL/min — ABNORMAL LOW (ref >60)
Glucose, Bld: 357 mg/dL — ABNORMAL HIGH (ref 70–99)
Potassium: 4.7 mmol/L (ref 3.5–5.1)
Sodium: 137 mmol/L (ref 135–145)
Total Bilirubin: 0.3 mg/dL (ref 0.3–1.2)
Total Protein: 6.2 g/dL — ABNORMAL LOW (ref 6.5–8.1)

## 2019-03-03 LAB — CBC WITH DIFFERENTIAL/PLATELET
Abs Immature Granulocytes: 0.05 10*3/uL (ref 0.00–0.07)
Basophils Absolute: 0 10*3/uL (ref 0.0–0.1)
Basophils Relative: 0 %
Eosinophils Absolute: 0.7 10*3/uL — ABNORMAL HIGH (ref 0.0–0.5)
Eosinophils Relative: 7 %
HCT: 33.7 % — ABNORMAL LOW (ref 36.0–46.0)
Hemoglobin: 10.7 g/dL — ABNORMAL LOW (ref 12.0–15.0)
Immature Granulocytes: 1 %
Lymphocytes Relative: 13 %
Lymphs Abs: 1.2 10*3/uL (ref 0.7–4.0)
MCH: 29.2 pg (ref 26.0–34.0)
MCHC: 31.8 g/dL (ref 30.0–36.0)
MCV: 91.8 fL (ref 80.0–100.0)
Monocytes Absolute: 1.1 10*3/uL — ABNORMAL HIGH (ref 0.1–1.0)
Monocytes Relative: 11 %
Neutro Abs: 6.5 10*3/uL (ref 1.7–7.7)
Neutrophils Relative %: 68 %
Platelets: 255 10*3/uL (ref 150–400)
RBC: 3.67 MIL/uL — ABNORMAL LOW (ref 3.87–5.11)
RDW: 14.7 % (ref 11.5–15.5)
WBC: 9.6 10*3/uL (ref 4.0–10.5)
nRBC: 0 % (ref 0.0–0.2)

## 2019-03-03 MED ORDER — FUROSEMIDE 10 MG/ML IJ SOLN
40.0000 mg | Freq: Once | INTRAMUSCULAR | Status: AC
  Administered 2019-03-04: 40 mg via INTRAVENOUS
  Filled 2019-03-03: qty 4

## 2019-03-03 MED ORDER — SODIUM CHLORIDE 0.9% FLUSH
3.0000 mL | Freq: Once | INTRAVENOUS | Status: AC
  Administered 2019-03-04: 3 mL via INTRAVENOUS

## 2019-03-03 MED ORDER — HYDRALAZINE HCL 20 MG/ML IJ SOLN
5.0000 mg | Freq: Once | INTRAMUSCULAR | Status: AC
  Administered 2019-03-04: 5 mg via INTRAVENOUS
  Filled 2019-03-03: qty 1

## 2019-03-03 NOTE — ED Notes (Signed)
EDP at bedside  

## 2019-03-03 NOTE — ED Provider Notes (Signed)
TIME SEEN: 11:19 PM  CHIEF COMPLAINT: SOB, hypertension  HPI: Patient is a 68 year old female with history of hypertension, pulmonary hypertension, diabetes, morbid obesity, obstructive sleep apnea who presents to the emergency department with increasing shortness of breath and elevated blood pressures.  States she has been) for the past 2 to 3 years and has progressively worsened over the past 2 months.  She states today it was even worse.  No chest pain or chest discomfort.  Was recently diagnosed by Dr. Maple HudsonYoung with pneumonia and finished Augmentin today.  Last fever was 4 days ago and was 100.8.  She states her blood pressures have been slowly going up.  She is on amlodipine, chlorthalidone, hydralazine, ramipril and spironolactone.  She reports compliance with her medications.  Denies significant cough.  No COVID exposures.  She does not wear oxygen at home.  Denies history of CHF.  ROS: See HPI Constitutional: no fever  Eyes: no drainage  ENT: no runny nose   Cardiovascular:  no chest pain  Resp:  SOB  GI: no vomiting GU: no dysuria Integumentary: no rash  Allergy: no hives  Musculoskeletal: no leg swelling  Neurological: no slurred speech ROS otherwise negative  PAST MEDICAL HISTORY/PAST SURGICAL HISTORY:  Past Medical History:  Diagnosis Date  . Anxiety   . Diabetes mellitus   . Edema of extremities 03/02/2016  . Essential hypertension, benign   . Lazy eye   . Major depressive disorder, single episode, unspecified   . Obesity (BMI 30-39.9) 03/01/2016  . OSA (obstructive sleep apnea)   . Pulmonary HTN (HCC)    mild with PASP 40mmHg by echo 05/2016  . Pure hypercholesterolemia   . Retinopathy, due to hypertension     MEDICATIONS:  Prior to Admission medications   Medication Sig Start Date End Date Taking? Authorizing Provider  Alcohol Swabs (ALCOHOL PREP) PADS Pt uses an alcohol pad each time sugars are tested. Pt tests twice daily. Dx E11.9 02/06/17   Sheliah Hatchabori, Katherine E, MD   amLODipine (NORVASC) 10 MG tablet TAKE 1 TABLET BY MOUTH EVERY DAY 02/23/19   Sheliah Hatchabori, Katherine E, MD  amoxicillin-clavulanate (AUGMENTIN) 875-125 MG tablet Take 1 tablet by mouth 2 (two) times daily for 7 days. 02/24/19 03/03/19  Parrett, Virgel Bouquetammy S, NP  atorvastatin (LIPITOR) 40 MG tablet TAKE 1 TABLET BY MOUTH EVERY DAY 12/01/18   Sheliah Hatchabori, Katherine E, MD  chlorthalidone (HYGROTON) 50 MG tablet Take 1 tablet (50 mg total) by mouth daily. Patient taking differently: Take 50 mg by mouth daily. Pt reports taking 1/2 tablet daily on 02/16/2019 //edw 01/22/19   Quintella Reicherturner, Traci R, MD  Cholecalciferol (VITAMIN D PO) Take 2,000 Int'l Units by mouth daily.     [provider]  empagliflozin (JARDIANCE) 10 MG TABS tablet Take 10 mg by mouth daily before breakfast. 02/17/19   Sheliah Hatchabori, Katherine E, MD  fish oil-omega-3 fatty acids 1000 MG capsule Take 1 g by mouth daily.    [provider]  fluticasone (FLONASE) 50 MCG/ACT nasal spray SPRAY 2 SPRAYS INTO EACH NOSTRIL EVERY DAY 03/26/18   Sheliah Hatchabori, Katherine E, MD  glipiZIDE (GLUCOTROL XL) 10 MG 24 hr tablet TAKE 1 TABLET BY MOUTH ONCE DAILY WITH BREAKFAST 02/23/19   Sheliah Hatchabori, Katherine E, MD  glucose blood (FREESTYLE LITE) test strip USE TO TEST BLOOD GLUCOSE 2 TIMES DAILY. Dx E11.9 10/16/17   Sheliah Hatchabori, Katherine E, MD  hydrALAZINE (APRESOLINE) 25 MG tablet Take 1 tablet (25 mg total) by mouth 3 (three) times daily. 02/09/19  Tonny Bollman, MD  loratadine (CLARITIN) 10 MG tablet TAKE 1 TABLET BY MOUTH EVERY DAY 02/06/18   Sheliah Hatch, MD  meclizine (ANTIVERT) 25 MG tablet Take 1 tablet (25 mg total) by mouth 3 (three) times daily as needed for dizziness. 10/02/16   Sheliah Hatch, MD  Melatonin 10 MG CAPS Take 10 mg by mouth at bedtime.    [provider]  MELATONIN PO Take 3 mg by mouth at bedtime.     [provider]  metFORMIN (GLUCOPHAGE-XR) 500 MG 24 hr tablet TAKE 2 TABLETS BY MOUTH TWICE DAILY BEFORE A MEAL 01/06/19   Sheliah Hatch, MD  Multiple Vitamins-Minerals (VISION FORMULA) TABS Take 1 tablet by mouth every morning.     [provider]  OVER THE COUNTER MEDICATION Equate Vision Formula 50+    [provider]  pioglitazone (ACTOS) 30 MG tablet TAKE 1 TABLET BY MOUTH EVERY DAY 02/23/19   Sheliah Hatch, MD  ramipril (ALTACE) 10 MG capsule TAKE 2 CAPSULES BY MOUTH EVERY DAY 01/06/19   Sheliah Hatch, MD  sertraline (ZOLOFT) 50 MG tablet TAKE 1 TABLET BY MOUTH EVERY DAY 02/16/19   Sheliah Hatch, MD  spironolactone (ALDACTONE) 25 MG tablet TAKE 1/2 TABLET BY MOUTH EVERY DAY 01/19/19   Sheliah Hatch, MD  Suvorexant (BELSOMRA) 10 MG TABS Take 10 mg by mouth at bedtime. Patient not taking: Reported on 02/16/2019 04/01/18   Waldon Merl, PA-C  TRUEPLUS LANCETS 30G MISC Use one lancet each time sugars are tested. Pt tests twice daily. Dx. E11.9 02/06/17   Sheliah Hatch, MD  umeclidinium-vilanterol (ANORO ELLIPTA) 62.5-25 MCG/INH AEPB Inhale 1 puff into the lungs daily. 02/16/19   Waymon Budge, MD    ALLERGIES:  No Known Allergies  SOCIAL HISTORY:  Social History   Tobacco Use  . Smoking status: Never Smoker  . Smokeless tobacco: Never Used  Substance Use Topics  . Alcohol use: No    Alcohol/week: 0.0 standard drinks    FAMILY HISTORY: Family History  Adopted: Yes  Problem Relation Age of Onset  . Alcohol abuse Mother   . ADD / ADHD Mother   . Dementia Mother   . Breast cancer Neg Hx     EXAM: BP (!) 220/55   Pulse 95   Temp 98.6 F (37 C) (Oral)   Resp 20   LMP 06/25/2008   SpO2 93%  CONSTITUTIONAL: Alert and oriented and responds appropriately to questions.  Chronically ill-appearing, obese HEAD: Normocephalic EYES: Conjunctivae clear, pupils appear equal, EOMI ENT: normal nose; moist mucous membranes NECK: Supple, no meningismus, no nuchal rigidity, no LAD  CARD: RRR; S1 and S2 appreciated; no murmurs, no clicks, no rubs, no  gallops RESP: Patient appears very short of breath with just minimal movement in the bed.  She becomes tachypneic and speaking short sentences with short distances of ambulation.  No hypoxia.  Bibasilar crackles on exam.  No rhonchi or wheezing. ABD/GI: Normal bowel sounds; non-distended; soft, non-tender, no rebound, no guarding, no peritoneal signs, no hepatosplenomegaly BACK:  The back appears normal and is non-tender to palpation, there is no CVA tenderness EXT: Normal ROM in all joints; non-tender to palpation; mild edema in bilateral lower extremities; normal capillary refill; no cyanosis, no calf tenderness or swelling    SKIN: Normal color for age and race; warm; no rash NEURO: Moves all extremities equally PSYCH: The patient's mood and manner are appropriate. Grooming and personal hygiene  are appropriate.  MEDICAL DECISION MAKING: Patient here with hypertension and pulmonary edema.  Her last echocardiogram February 02, 2019 showed EF of 60 to 65% with normal systolic and diastolic function.  Suspect that her pulmonary edema is related to her elevated blood pressures today.  She is not in distress I do not feel at this time needs BiPAP.  Will give IV hydralazine to work on bringing her blood pressure down and IV Lasix.  Given her increased shortness of breath, I feel she will need admission.  Will add on troponin, BNP and COVID swab.  Labs show elevated creatinine today of 1.79.  ED PROGRESS: Patient's blood pressure has improved slightly after IV hydralazine.  Sats are in the low 90s on room air at rest.  COVID swab negative.  Troponin is 18.  BNP is 50.  She is also at risk for PE.  She is unable to obtain a CTA of her chest given her GFR of 29.  Will check a d-dimer.  I feel that patient needs admission for her increased shortness of breath, increased work of breathing and low oxygen saturation.  Also feel she needs treatment for her hypertension and acute renal failure.  Will discuss with  medicine for admission.   BP 161/54 and sats 88% on RA while awake sitting at 45 degree angle.  Placed on 2L [place] and sats 95%.  Does not wear O2 at home.  Has CPAP for OSA.   2:40 AM Discussed patient's case with hospitalist, Dr. Marlowe Sax.  I have recommended admission and patient (and family if present) agree with this plan. Admitting physician will place admission orders.   I reviewed all nursing notes, vitals, pertinent previous records, EKGs, lab and urine results, imaging (as available).   D dimer positive.  Hospitalist has ordered VQ scan.    EKG Interpretation  Date/Time:  Tuesday March 03 2019 16:50:20 EDT Ventricular Rate:  83 PR Interval:  144 QRS Duration: 86 QT Interval:  362 QTC Calculation: 425 R Axis:   122 Text Interpretation:  Normal sinus rhythm Possible Right ventricular hypertrophy Abnormal ECG No old tracing to compare Confirmed by Iretha Kirley, Cyril Mourning [phone]) on 03/03/2019 11:24:11 PM         Ladon Vandenberghe, Delice Bison, DO 03/04/19 [id number]

## 2019-03-03 NOTE — ED Triage Notes (Signed)
Pt arrives pov with reports of shob with hypertension. States dx with PNA last week and almost done with abx. Pt tearful in triage.

## 2019-03-03 NOTE — ED Notes (Signed)
Pt ambulatory to bathroom without assistance but reports feeling "winded".

## 2019-03-04 ENCOUNTER — Inpatient Hospital Stay (HOSPITAL_COMMUNITY): Payer: Medicare Other

## 2019-03-04 DIAGNOSIS — G4733 Obstructive sleep apnea (adult) (pediatric): Secondary | ICD-10-CM | POA: Diagnosis not present

## 2019-03-04 DIAGNOSIS — N390 Urinary tract infection, site not specified: Secondary | ICD-10-CM | POA: Diagnosis present

## 2019-03-04 DIAGNOSIS — J9601 Acute respiratory failure with hypoxia: Secondary | ICD-10-CM | POA: Diagnosis present

## 2019-03-04 DIAGNOSIS — T502X5A Adverse effect of carbonic-anhydrase inhibitors, benzothiadiazides and other diuretics, initial encounter: Secondary | ICD-10-CM | POA: Diagnosis present

## 2019-03-04 DIAGNOSIS — Z7984 Long term (current) use of oral hypoglycemic drugs: Secondary | ICD-10-CM | POA: Diagnosis not present

## 2019-03-04 DIAGNOSIS — J811 Chronic pulmonary edema: Secondary | ICD-10-CM

## 2019-03-04 DIAGNOSIS — N183 Chronic kidney disease, stage 3 (moderate): Secondary | ICD-10-CM | POA: Diagnosis present

## 2019-03-04 DIAGNOSIS — G4700 Insomnia, unspecified: Secondary | ICD-10-CM | POA: Diagnosis present

## 2019-03-04 DIAGNOSIS — E11319 Type 2 diabetes mellitus with unspecified diabetic retinopathy without macular edema: Secondary | ICD-10-CM | POA: Diagnosis present

## 2019-03-04 DIAGNOSIS — E869 Volume depletion, unspecified: Secondary | ICD-10-CM | POA: Diagnosis present

## 2019-03-04 DIAGNOSIS — E1165 Type 2 diabetes mellitus with hyperglycemia: Secondary | ICD-10-CM | POA: Diagnosis present

## 2019-03-04 DIAGNOSIS — Z79899 Other long term (current) drug therapy: Secondary | ICD-10-CM | POA: Diagnosis not present

## 2019-03-04 DIAGNOSIS — J9811 Atelectasis: Secondary | ICD-10-CM | POA: Diagnosis not present

## 2019-03-04 DIAGNOSIS — E1122 Type 2 diabetes mellitus with diabetic chronic kidney disease: Secondary | ICD-10-CM | POA: Diagnosis present

## 2019-03-04 DIAGNOSIS — N179 Acute kidney failure, unspecified: Secondary | ICD-10-CM

## 2019-03-04 DIAGNOSIS — I10 Essential (primary) hypertension: Secondary | ICD-10-CM | POA: Diagnosis not present

## 2019-03-04 DIAGNOSIS — J209 Acute bronchitis, unspecified: Secondary | ICD-10-CM | POA: Diagnosis not present

## 2019-03-04 DIAGNOSIS — J810 Acute pulmonary edema: Secondary | ICD-10-CM | POA: Diagnosis not present

## 2019-03-04 DIAGNOSIS — Z20828 Contact with and (suspected) exposure to other viral communicable diseases: Secondary | ICD-10-CM | POA: Diagnosis present

## 2019-03-04 DIAGNOSIS — I272 Pulmonary hypertension, unspecified: Secondary | ICD-10-CM | POA: Diagnosis not present

## 2019-03-04 DIAGNOSIS — E7800 Pure hypercholesterolemia, unspecified: Secondary | ICD-10-CM | POA: Diagnosis present

## 2019-03-04 DIAGNOSIS — I959 Hypotension, unspecified: Secondary | ICD-10-CM | POA: Diagnosis not present

## 2019-03-04 DIAGNOSIS — I2729 Other secondary pulmonary hypertension: Secondary | ICD-10-CM | POA: Diagnosis not present

## 2019-03-04 DIAGNOSIS — J449 Chronic obstructive pulmonary disease, unspecified: Secondary | ICD-10-CM | POA: Diagnosis present

## 2019-03-04 DIAGNOSIS — R0602 Shortness of breath: Secondary | ICD-10-CM | POA: Diagnosis not present

## 2019-03-04 DIAGNOSIS — I5033 Acute on chronic diastolic (congestive) heart failure: Secondary | ICD-10-CM | POA: Diagnosis present

## 2019-03-04 DIAGNOSIS — R0902 Hypoxemia: Secondary | ICD-10-CM | POA: Diagnosis not present

## 2019-03-04 DIAGNOSIS — I130 Hypertensive heart and chronic kidney disease with heart failure and stage 1 through stage 4 chronic kidney disease, or unspecified chronic kidney disease: Secondary | ICD-10-CM | POA: Diagnosis present

## 2019-03-04 LAB — URINALYSIS, ROUTINE W REFLEX MICROSCOPIC
Bacteria, UA: NONE SEEN
Bilirubin Urine: NEGATIVE
Glucose, UA: >=500 mg/dL — AB
Hgb urine dipstick: NEGATIVE
Ketones, ur: NEGATIVE mg/dL
Nitrite: NEGATIVE
Protein, ur: NEGATIVE mg/dL
Specific Gravity, Urine: 1.014 (ref 1.005–1.030)
pH: 5 (ref 5.0–8.0)

## 2019-03-04 LAB — BASIC METABOLIC PANEL
Anion gap: 10 (ref 5–15)
BUN: 48 mg/dL — ABNORMAL HIGH (ref 8–23)
CO2: 16 mmol/L — ABNORMAL LOW (ref 22–32)
Calcium: 9.3 mg/dL (ref 8.9–10.3)
Chloride: 111 mmol/L (ref 98–111)
Creatinine, Ser: 1.4 mg/dL — ABNORMAL HIGH (ref 0.44–1.00)
GFR calc Af Amer: 45 mL/min — ABNORMAL LOW (ref >60)
GFR calc non Af Amer: 39 mL/min — ABNORMAL LOW (ref >60)
Glucose, Bld: 329 mg/dL — ABNORMAL HIGH (ref 70–99)
Potassium: 4.8 mmol/L (ref 3.5–5.1)
Sodium: 137 mmol/L (ref 135–145)

## 2019-03-04 LAB — CBG MONITORING, ED
Glucose-Capillary: 312 mg/dL — ABNORMAL HIGH (ref 70–99)
Glucose-Capillary: 299 mg/dL — ABNORMAL HIGH (ref 70–99)

## 2019-03-04 LAB — CBC
HCT: 32.7 % — ABNORMAL LOW (ref 36.0–46.0)
Hemoglobin: 10.3 g/dL — ABNORMAL LOW (ref 12.0–15.0)
MCH: 29 pg (ref 26.0–34.0)
MCHC: 31.5 g/dL (ref 30.0–36.0)
MCV: 92.1 fL (ref 80.0–100.0)
Platelets: 257 10*3/uL (ref 150–400)
RBC: 3.55 MIL/uL — ABNORMAL LOW (ref 3.87–5.11)
RDW: 14.6 % (ref 11.5–15.5)
WBC: 9.3 10*3/uL (ref 4.0–10.5)
nRBC: 0 % (ref 0.0–0.2)

## 2019-03-04 LAB — D-DIMER, QUANTITATIVE: D-Dimer, Quant: 1.69 ug/mL-FEU — ABNORMAL HIGH (ref 0.00–0.50)

## 2019-03-04 LAB — SARS CORONAVIRUS 2 BY RT PCR (HOSPITAL ORDER, PERFORMED IN ~~LOC~~ HOSPITAL LAB): SARS Coronavirus 2: NEGATIVE

## 2019-03-04 LAB — HIV ANTIBODY (ROUTINE TESTING W REFLEX): HIV Screen 4th Generation wRfx: NONREACTIVE

## 2019-03-04 LAB — GLUCOSE, CAPILLARY
Glucose-Capillary: 229 mg/dL — ABNORMAL HIGH (ref 70–99)
Glucose-Capillary: 330 mg/dL — ABNORMAL HIGH (ref 70–99)

## 2019-03-04 LAB — BRAIN NATRIURETIC PEPTIDE: B Natriuretic Peptide: 50.8 pg/mL (ref 0.0–100.0)

## 2019-03-04 LAB — TROPONIN I (HIGH SENSITIVITY): Troponin I (High Sensitivity): 18 ng/L — ABNORMAL HIGH (ref <18)

## 2019-03-04 LAB — HEPARIN LEVEL (UNFRACTIONATED): Heparin Unfractionated: <0.1 IU/mL — ABNORMAL LOW (ref 0.30–0.70)

## 2019-03-04 MED ORDER — INSULIN ASPART 100 UNIT/ML ~~LOC~~ SOLN
0.0000 [IU] | Freq: Three times a day (TID) | SUBCUTANEOUS | Status: DC
  Administered 2019-03-04: 5 [IU] via SUBCUTANEOUS
  Administered 2019-03-04: 7 [IU] via SUBCUTANEOUS
  Administered 2019-03-04: 3 [IU] via SUBCUTANEOUS
  Administered 2019-03-05: 7 [IU] via SUBCUTANEOUS

## 2019-03-04 MED ORDER — ACETAMINOPHEN 325 MG PO TABS: 650.0000 mg | ORAL_TABLET | Freq: Four times a day (QID) | ORAL | Status: DC | PRN

## 2019-03-04 MED ORDER — INSULIN ASPART 100 UNIT/ML ~~LOC~~ SOLN
0.0000 [IU] | Freq: Every day | SUBCUTANEOUS | Status: DC
  Administered 2019-03-04: 4 [IU] via SUBCUTANEOUS
  Administered 2019-03-06: 2 [IU] via SUBCUTANEOUS
  Administered 2019-03-07: 5 [IU] via SUBCUTANEOUS

## 2019-03-04 MED ORDER — VITAMIN D 25 MCG (1000 UNIT) PO TABS
2000.0000 [IU] | ORAL_TABLET | Freq: Every day | ORAL | Status: DC
  Administered 2019-03-04 – 2019-03-16 (×13): 2000 [IU] via ORAL
  Filled 2019-03-04 (×14): qty 2

## 2019-03-04 MED ORDER — LORATADINE 10 MG PO TABS
10.0000 mg | ORAL_TABLET | Freq: Every day | ORAL | Status: DC
  Administered 2019-03-04 – 2019-03-16 (×13): 10 mg via ORAL
  Filled 2019-03-04 (×13): qty 1

## 2019-03-04 MED ORDER — ACETAMINOPHEN 650 MG RE SUPP: 650.0000 mg | Freq: Four times a day (QID) | RECTAL | Status: DC | PRN

## 2019-03-04 MED ORDER — AMLODIPINE BESYLATE 10 MG PO TABS
10.0000 mg | ORAL_TABLET | Freq: Every day | ORAL | Status: DC
  Administered 2019-03-04 – 2019-03-10 (×7): 10 mg via ORAL
  Filled 2019-03-04 (×4): qty 1
  Filled 2019-03-04: qty 2
  Filled 2019-03-04 (×2): qty 1

## 2019-03-04 MED ORDER — HEPARIN BOLUS VIA INFUSION
4000.0000 [IU] | Freq: Once | INTRAVENOUS | Status: AC
  Administered 2019-03-04: 4000 [IU] via INTRAVENOUS
  Filled 2019-03-04: qty 4000

## 2019-03-04 MED ORDER — FLUTICASONE PROPIONATE 50 MCG/ACT NA SUSP
2.0000 | Freq: Every day | NASAL | Status: DC
  Administered 2019-03-04 – 2019-03-16 (×13): 2 via NASAL
  Filled 2019-03-04: qty 16

## 2019-03-04 MED ORDER — FUROSEMIDE 10 MG/ML IJ SOLN
40.0000 mg | Freq: Two times a day (BID) | INTRAMUSCULAR | Status: DC
  Administered 2019-03-04 – 2019-03-06 (×6): 40 mg via INTRAVENOUS
  Filled 2019-03-04 (×6): qty 4

## 2019-03-04 MED ORDER — MELATONIN 3 MG PO TABS
3.0000 mg | ORAL_TABLET | Freq: Every day | ORAL | Status: DC
  Administered 2019-03-04 – 2019-03-15 (×12): 3 mg via ORAL
  Filled 2019-03-04 (×15): qty 1

## 2019-03-04 MED ORDER — HEPARIN (PORCINE) 25000 UT/250ML-% IV SOLN
1250.0000 [IU]/h | INTRAVENOUS | Status: DC
  Administered 2019-03-04: 05:00:00 1250 [IU]/h via INTRAVENOUS
  Filled 2019-03-04: qty 250

## 2019-03-04 MED ORDER — SERTRALINE HCL 50 MG PO TABS
50.0000 mg | ORAL_TABLET | Freq: Every day | ORAL | Status: DC
  Administered 2019-03-04 – 2019-03-16 (×13): 50 mg via ORAL
  Filled 2019-03-04 (×13): qty 1

## 2019-03-04 MED ORDER — ATORVASTATIN CALCIUM 40 MG PO TABS
40.0000 mg | ORAL_TABLET | Freq: Every day | ORAL | Status: DC
  Administered 2019-03-04 – 2019-03-15 (×11): 40 mg via ORAL
  Filled 2019-03-04 (×12): qty 1

## 2019-03-04 MED ORDER — AMLODIPINE BESYLATE 10 MG PO TABS
10.0000 mg | ORAL_TABLET | Freq: Every day | ORAL | Status: DC
  Filled 2019-03-04: qty 1

## 2019-03-04 MED ORDER — TECHNETIUM TO 99M ALBUMIN AGGREGATED
1.5300 | Freq: Once | INTRAVENOUS | Status: AC | PRN
  Administered 2019-03-04: 1.53 via INTRAVENOUS

## 2019-03-04 MED ORDER — UMECLIDINIUM-VILANTEROL 62.5-25 MCG/INH IN AEPB
1.0000 | INHALATION_SPRAY | Freq: Every day | RESPIRATORY_TRACT | Status: DC
  Administered 2019-03-04 – 2019-03-16 (×13): 1 via RESPIRATORY_TRACT
  Filled 2019-03-04 (×3): qty 14

## 2019-03-04 MED ORDER — OMEGA-3-ACID ETHYL ESTERS 1 G PO CAPS
1.0000 g | ORAL_CAPSULE | Freq: Every day | ORAL | Status: DC
  Administered 2019-03-04 – 2019-03-16 (×13): 1 g via ORAL
  Filled 2019-03-04 (×13): qty 1

## 2019-03-04 MED ORDER — PROSIGHT PO TABS
1.0000 | ORAL_TABLET | Freq: Every day | ORAL | Status: DC
  Administered 2019-03-04 – 2019-03-16 (×13): 1 via ORAL
  Filled 2019-03-04 (×13): qty 1

## 2019-03-04 MED ORDER — HYDRALAZINE HCL 20 MG/ML IJ SOLN
5.0000 mg | INTRAMUSCULAR | Status: DC | PRN
  Administered 2019-03-04 – 2019-03-05 (×2): 5 mg via INTRAVENOUS
  Filled 2019-03-04 (×2): qty 1

## 2019-03-04 NOTE — Progress Notes (Signed)
RT went to setup a CPAP for patient.  Patient refusing CPAP at this time.

## 2019-03-04 NOTE — ED Notes (Addendum)
Pt. Noted respiratory rate 44 with spO2 89% on 2 L.   Pt O2 increased to 4 L at this time. Pt RR mid 20s SpO2 95%  Dr. Waldron Labs made aware

## 2019-03-04 NOTE — Progress Notes (Signed)
Pt admitted from ed in yellow for mews due to tachypnea. This is not an acute change. Yellow mews q2 hour vitals started. Will continue to monitor.

## 2019-03-04 NOTE — ED Notes (Signed)
Pt. SpO2 dropped to 88% on room air. Placed on 2 L O2, SpO2 93%

## 2019-03-04 NOTE — ED Notes (Signed)
Pt's CBG result was 299. Informed Alexa - RN.

## 2019-03-04 NOTE — H&P (Signed)
History and Physical    Kayla Brown ZOX:[id number] DOB: 1951/02/21 DOA: 03/03/2019  PCP: Sheliah Hatch, MD Patient coming from: Home  Chief Complaint: Shortness of breath, hypertension  HPI: Kayla Brown is a 68 y.o. female with medical history significant of hypertension, pulmonary hypertension, diabetes, morbid obesity, obstructive sleep apnea presenting to the hospital for evaluation of shortness of breath and hypertension.  Patient states her blood pressure has been high for very long time.  Home blood pressure log showing elevated systolic blood pressure readings ranging from 160s to 190s.  States that she is on several different blood pressure medications and has been taking all of them as prescribed but continues to have high blood pressure.  Reports 3-week history of progressively worsening dyspnea at rest and states she was recently seen by her pulmonologist who diagnosed her with pneumonia and treated her with a 7-day course of Levaquin.  She has almost finished Levaquin and only has 1 tablet left but there has been no improvement of her symptoms.  Denies any fevers, chills, cough, or chest pain.  Denies orthopnea or lower extremity edema.  Denies history of blood clots.  Denies noticing any calf pain or lower extremity erythema or swelling.  Denies nausea, vomiting, abdominal pain, or diarrhea.  Denies dysuria, urinary frequency, or urgency.  ED Course: Oxygen saturation 88% on room air, placed on 2 L supplemental oxygen.  Tachypneic.  Afebrile and not tachycardic.  Systolic as high as 220 recorded in the ED.  No leukocytosis.  Hemoglobin 10.7, previously in the 12 range.  Bicarb 18, no significant change since labs done 2 weeks ago. Blood glucose 357.  BUN 53, creatinine 1.7.  Creatinine 1.0-1.2 three weeks ago.  BNP normal.  High-sensitivity troponin 18.  SARS-CoV-2 test negative.  UA with small amount of leukocytes, negative nitrite, 21-50 WBCs, and no bacteria seen on microscopic  examination.  D-dimer pending.  Chest x-ray showing cardiac enlargement and mild pulmonary vascular congestion with thickening along the major fissure of the right lung.  Findings thought to reflect early CHF.  No airspace consolidation identified. Patient received IV Lasix 40 mg and IV hydralazine 5 mg.  Blood pressure subsequently improved with systolic in the 130s.  Review of Systems:  All systems reviewed and apart from history of presenting illness, are negative.  Past Medical History:  Diagnosis Date   Anxiety    Diabetes mellitus    Edema of extremities 03/02/2016   Essential hypertension, benign    Lazy eye    Major depressive disorder, single episode, unspecified    Obesity (BMI 30-39.9) 03/01/2016   OSA (obstructive sleep apnea)    Pulmonary HTN (HCC)    mild with PASP by echo 05/2016   Pure hypercholesterolemia    Retinopathy, due to hypertension     Past Surgical History:  Procedure Laterality Date   CPAP TITRATION  10/14/2015   DILATION AND CURETTAGE OF UTERUS  2005-2008   x2   EYE SURGERY     cataracts   WISDOM TOOTH EXTRACTION       reports that she has never smoked. She has never used smokeless tobacco. She reports that she does not drink alcohol or use drugs.  No Known Allergies  Family History  Adopted: Yes  Problem Relation Age of Onset   Alcohol abuse Mother    ADD / ADHD Mother    Dementia Mother    Breast cancer Neg Hx     Prior to Admission medications  Medication Sig Start Date End Date Taking? Authorizing Provider  amLODipine (NORVASC) 10 MG tablet TAKE 1 TABLET BY MOUTH EVERY DAY Patient taking differently: Take 10 mg by mouth daily.  02/23/19  Yes Sheliah Hatch, MD  amoxicillin-clavulanate (AUGMENTIN) 875-125 MG tablet Take 1 tablet by mouth 2 (two) times daily for 7 days. 02/24/19 03/03/28 Yes Parrett, Tammy S, NP  atorvastatin (LIPITOR) 40 MG tablet TAKE 1 TABLET BY MOUTH EVERY DAY Patient taking differently: Take 40  mg by mouth daily at 6 PM.  12/01/18  Yes Sheliah Hatch, MD  chlorthalidone (HYGROTON) 50 MG tablet Take 1 tablet (50 mg total) by mouth daily. Patient taking differently: Take 25 mg by mouth daily.  01/22/19  Yes Turner, Cornelious Bryant, MD  Cholecalciferol (VITAMIN D PO) Take 2,000 Units by mouth daily.    Yes [provider]  empagliflozin (JARDIANCE) 10 MG TABS tablet Take 10 mg by mouth daily before breakfast. 02/17/19  Yes Sheliah Hatch, MD  fish oil-omega-3 fatty acids 1000 MG capsule Take 1 g by mouth daily.   Yes [provider]  fluticasone (FLONASE) 50 MCG/ACT nasal spray SPRAY 2 SPRAYS INTO EACH NOSTRIL EVERY DAY Patient taking differently: Place 2 sprays into both nostrils daily.  03/26/18  Yes Sheliah Hatch, MD  glipiZIDE (GLUCOTROL XL) 10 MG 24 hr tablet TAKE 1 TABLET BY MOUTH ONCE DAILY WITH BREAKFAST Patient taking differently: Take 10 mg by mouth daily with breakfast.  02/23/19  Yes Sheliah Hatch, MD  hydrALAZINE (APRESOLINE) 25 MG tablet Take 1 tablet (25 mg total) by mouth 3 (three) times daily. 02/09/19  Yes Tonny Bollman, MD  hydrochlorothiazide (MICROZIDE) 12.5 MG capsule Take 12.5 mg by mouth 2 (two) times daily. 02/23/19  Yes [provider]  loratadine (CLARITIN) 10 MG tablet TAKE 1 TABLET BY MOUTH EVERY DAY Patient taking differently: Take 10 mg by mouth daily.  02/06/18  Yes Sheliah Hatch, MD  meclizine (ANTIVERT) 25 MG tablet Take 1 tablet (25 mg total) by mouth 3 (three) times daily as needed for dizziness. 10/02/16  Yes Sheliah Hatch, MD  Melatonin 3 MG TABS Take 3 mg by mouth at bedtime.   Yes [provider]  metFORMIN (GLUCOPHAGE-XR) 500 MG 24 hr tablet TAKE 2 TABLETS BY MOUTH TWICE DAILY BEFORE A MEAL Patient taking differently: Take 1,000 mg by mouth 2 (two) times daily.  01/06/19  Yes Sheliah Hatch, MD  Multiple Vitamins-Minerals (VISION FORMULA) TABS Take 1 tablet by mouth daily.    Yes [provider]  OVER THE COUNTER MEDICATION Take 1 tablet by mouth daily. Equate Vision Formula 50+    Yes [provider]  pioglitazone (ACTOS) 30 MG tablet TAKE 1 TABLET BY MOUTH EVERY DAY Patient taking differently: Take 30 mg by mouth daily.  02/23/19  Yes Sheliah Hatch, MD  ramipril (ALTACE) 10 MG capsule TAKE 2 CAPSULES BY MOUTH EVERY DAY Patient taking differently: Take 20 mg by mouth daily.  01/06/19  Yes Sheliah Hatch, MD  sertraline (ZOLOFT) 50 MG tablet TAKE 1 TABLET BY MOUTH EVERY DAY Patient taking differently: Take 50 mg by mouth daily.  02/16/19  Yes Sheliah Hatch, MD  spironolactone (ALDACTONE) 25 MG tablet TAKE 1/2 TABLET BY MOUTH EVERY DAY Patient taking differently: Take 12.5 mg by mouth daily.  01/19/19  Yes Sheliah Hatch, MD  umeclidinium-vilanterol (ANORO ELLIPTA) 62.5-25 MCG/INH AEPB Inhale 1 puff into the lungs daily. 02/16/19  Yes Young,  Kasandra Knudsen, MD  Alcohol Swabs (ALCOHOL PREP) PADS Pt uses an alcohol pad each time sugars are tested. Pt tests twice daily. Dx E11.9 02/06/17   [name], MD  glucose blood (FREESTYLE LITE) test strip USE TO TEST BLOOD GLUCOSE 2 TIMES DAILY. Dx E11.9 10/16/17   [name], MD  Suvorexant (BELSOMRA) 10 MG TABS Take 10 mg by mouth at bedtime. Patient not taking: Reported on 02/16/2019 04/01/18   Brunetta Jeans, PA-C  TRUEPLUS LANCETS 30G MISC Use one lancet each time sugars are tested. Pt tests twice daily. Dx. E11.9 02/06/17   [name], MD    Physical Exam: Vitals:   03/04/19 0600 03/04/19 0632 03/04/19 0648 03/04/19 0804  BP: (!) 109/44  136/61 (!) 154/55  Pulse: 86 80 83   Resp: (!) 26 (!) 44 (!) 33 (!) 22  Temp:      TempSrc:      SpO2: 91% (!) 89% 91% 94%  Weight:      Height:        Physical Exam  Constitutional: She is oriented to person, place, and time. She appears well-developed and well-nourished. No distress.  HENT:  Head: Normocephalic.  Mouth/Throat:  Oropharynx is clear and moist.  Eyes: Right eye exhibits no discharge. Left eye exhibits no discharge.  Neck: Neck supple. JVD present.  Cardiovascular: Normal rate, regular rhythm and intact distal pulses.  Pulmonary/Chest: Breath sounds normal. She has no wheezes. She has no rales.  Satting well on 2 L supplemental oxygen Speaking clearly in full sentences  Abdominal: Soft. Bowel sounds are normal. She exhibits no distension. There is no abdominal tenderness. There is no guarding.  Musculoskeletal:        General: No edema.  Neurological: She is alert and oriented to person, place, and time.  Skin: Skin is warm and dry. She is not diaphoretic.     Labs on Admission: I have personally reviewed following labs and imaging studies  CBC: Recent Labs  Lab 03/03/19 1717 03/04/19 0416  WBC 9.6 9.3  NEUTROABS 6.5  --   HGB 10.7* 10.3*  HCT 33.7* 32.7*  MCV 91.8 92.1  PLT 255 403   Basic Metabolic Panel: Recent Labs  Lab 03/03/19 1717 03/04/19 0416  NA 137 137  K 4.7 4.8  CL 108 111  CO2 18* 16*  GLUCOSE 357* 329*  BUN 53* 48*  CREATININE 1.79* 1.40*  CALCIUM 9.4 9.3   GFR: Estimated Creatinine Clearance: 42.7 mL/min (A) (by C-G formula based on SCr of 1.4 mg/dL (H)). Liver Function Tests: Recent Labs  Lab 03/03/19 1717  AST 20  ALT 38  ALKPHOS 56  BILITOT 0.3  PROT 6.2*  ALBUMIN 3.0*   No results for input(s): LIPASE, AMYLASE in the last 168 hours. No results for input(s): AMMONIA in the last 168 hours. Coagulation Profile: No results for input(s): INR, PROTIME in the last 168 hours. Cardiac Enzymes: No results for input(s): CKTOTAL, CKMB, CKMBINDEX, TROPONINI in the last 168 hours. BNP (last 3 results) No results for input(s): PROBNP in the last 8760 hours. HbA1C: No results for input(s): HGBA1C in the last 72 hours. CBG: Recent Labs  Lab 03/04/19 0802  GLUCAP 312*   Lipid Profile: No results for input(s): CHOL, HDL, LDLCALC, TRIG, CHOLHDL, LDLDIRECT  in the last 72 hours. Thyroid Function Tests: No results for input(s): TSH, T4TOTAL, FREET4, T3FREE, THYROIDAB in the last 72 hours. Anemia Panel: No results for input(s): VITAMINB12, FOLATE, FERRITIN, TIBC, IRON,  RETICCTPCT in the last 72 hours. Urine analysis:    Component Value Date/Time   COLORURINE YELLOW 03/04/2019 0058   APPEARANCEUR CLEAR 03/04/2019 0058   LABSPEC 1.014 03/04/2019 0058   PHURINE 5.0 03/04/2019 0058   GLUCOSEU >=500 (A) 03/04/2019 0058   HGBUR NEGATIVE 03/04/2019 0058   BILIRUBINUR NEGATIVE 03/04/2019 0058   KETONESUR NEGATIVE 03/04/2019 0058   PROTEINUR NEGATIVE 03/04/2019 0058   NITRITE NEGATIVE 03/04/2019 0058   LEUKOCYTESUR SMALL (A) 03/04/2019 0058    Radiological Exams on Admission: Dg Chest 2 View  Result Date: 03/03/2019 CLINICAL DATA:  Pneumonia.  Short of breath. EXAM: CHEST - 2 VIEW COMPARISON:  02/17/2019 FINDINGS: Mild cardiac enlargement. Pulmonary vascular congestion identified. Lungs are hyperinflated and there are coarsened interstitial markings noted bilaterally. Pleural thickening along the major fissure is again noted. Scar or atelectasis noted in the right middle lobe. IMPRESSION: Cardiac enlargement and mild pulmonary vascular congestion with thickening along the major fissure of the right lung. Findings may reflect early CHF. No airspace consolidation identified. Electronically Signed   By: Signa Kellaylor  Stroud M.D.   On: 03/03/2019 17:55    EKG: Independently reviewed.  Sinus rhythm, incomplete RBBB. No significant change since prior tracing.  Assessment/Plan Principal Problem:   Acute respiratory failure with hypoxia (HCC) Active Problems:   Essential hypertension, benign   OSA (obstructive sleep apnea)   Pulmonary edema   AKI (acute kidney injury) (HCC)   Acute hypoxic respiratory failure secondary to pulmonary edema Oxygen saturation 88% on room air, placed on 2 L supplemental oxygen.  Tachypneic.  SARS-CoV-2 test negative. Chest  x-ray showing cardiac enlargement and mild pulmonary vascular congestion with thickening along the major fissure of the right lung.  Findings thought to reflect early CHF.  No airspace consolidation identified.  Does have JVD on exam.  BNP likely falsely normal given morbid obesity.  Suspect flash pulmonary edema due to significantly elevated blood pressure.  Recent echo done 8/10 with normal systolic function (EF 60 to 65%) and normal diastolic parameters.  D-dimer elevated at 1.69 in the setting of acute renal insufficiency. -Unable to do CT angiogram due to acute renal insufficiency.  Heparin has been started empirically.  Denies history of GI bleed or recent surgery.  VQ scan in the morning. -Received IV Lasix 40 mg in the ED.  Blood pressure stable.  Continue IV Lasix 40 mg twice daily. -Blood pressure management as mentioned below -Continue supplemental oxygen, wean as tolerated  AKI on CKD 3 BUN 53, creatinine 1.7.  Creatinine 1.0-1.2 three weeks ago.  Suspect related to vascular congestion from volume overload.   -Continue Lasix as above -Continue to monitor renal function -Monitor urine output -Avoid nephrotoxic agents/contrast  Uncontrolled hypertension Systolic as high as 220 recorded in the ED. Received IV hydralazine and blood pressure now improved with systolic in the 150s. Continue home amlodipine -Hydralazine PRN -Hold home ACE inhibitor and diuretics given AKI  Normocytic anemia Hemoglobin 10.7, previously in the 12 range.  No signs of active bleeding. -Check iron, ferritin, TIBC -Check FOBT -Continue to monitor CBC  OSA -CPAP at night  ?UTI UA with small amount of leukocytes, negative nitrite, 21-50 WBCs, and no bacteria seen on microscopic examination.  Patient is not endorsing any UTI symptoms.  Afebrile and no leukocytosis. -Urine culture pending  Poorly controlled non-insulin-dependent diabetes mellitus -Check A1c.  Sliding scale insulin sensitive with bedtime  coverage and CBG checks.  DVT prophylaxis: Heparin Code Status: Full code.  Discussed with the patient.  Family Communication: No family available. Disposition Plan: Anticipate discharge after clinical improvement. Consults called: None Admission status: It is my clinical opinion that admission to INPATIENT is reasonable and necessary in this 68 y.o. female  presenting with acute hypoxic respiratory failure secondary to pulmonary edema, AKI.  Receiving IV Lasix.  Has a supplemental oxygen requirement.  CT angiogram pending to rule out PE.  Given the aforementioned, the predictability of an adverse outcome is felt to be significant. I expect that the patient will require at least 2 midnights in the hospital to treat this condition.   The medical decision making on this patient was of high complexity and the patient is at high risk for clinical deterioration, therefore this is a level 3 visit.  John GiovanniVasundhra Orvin Netter MD [facility] Pager [phone]- [phone]  If 7PM-7AM, please contact night-coverage [email or web address] Password [email or web address]  03/04/2019, 9:05 AM

## 2019-03-04 NOTE — Progress Notes (Signed)
ANTICOAGULATION CONSULT NOTE - Initial Consult  Pharmacy Consult for heparin Indication: r/o VTE  No Known Allergies  Patient Measurements: Height: 5' (152.4 cm) Weight: 226 lb 13.7 oz (102.9 kg) IBW/kg (Calculated) : 45.5 Heparin Dosing Weight: 70kg  Vital Signs: Temp: 98.6 F (37 C) (09/08 1653) Temp Source: Oral (09/08 1653) BP: 131/55 (09/09 0216) Pulse Rate: 83 (09/09 0216)  Labs: Recent Labs    03/03/19 1717 03/03/19 2357  HGB 10.7*  --   HCT 33.7*  --   PLT 255  --   CREATININE 1.79*  --   TROPONINIHS  --  18*    Estimated Creatinine Clearance: 33.4 mL/min (A) (by C-G formula based on SCr of 1.79 mg/dL (H)).   Medical History: Past Medical History:  Diagnosis Date  . Anxiety   . Diabetes mellitus   . Edema of extremities 03/02/2016  . Essential hypertension, benign   . Lazy eye   . Major depressive disorder, single episode, unspecified   . Obesity (BMI 30-39.9) 03/01/2016  . OSA (obstructive sleep apnea)   . Pulmonary HTN ([place])    mild with PASP 22mmHg by echo 05/2016  . Pure hypercholesterolemia   . Retinopathy, due to hypertension     Assessment: 68yo female c/o SOB and HTN, dx'd w/ PNA last week and now had nearly completed ABX, D-dimer elevated, awaiting VQ scan, to begin heparin for possible VTE.  Goal of Therapy:  Heparin level 0.3-0.7 units/ml Monitor platelets by anticoagulation protocol: Yes   Plan:  Will give heparin 4000 units IV bolus x1 followed by gtt at 1250 units/hr and monitor heparin levels and CBC.  Wynona Neat, PharmD, BCPS  03/04/2019,4:27 AM

## 2019-03-04 NOTE — Telephone Encounter (Signed)
Called and spoke to Kayla Brown, patient's husband on [place].  He stated that patient is in the hospital.  Per husband patient has some extra fluid and is being taken care of in the hospital.  She was negative for COVID and she had chest xray.  Patient was supposed to be coming in for visit with NP on 03/06/2019 but they need to cancel that for now and will call us to reschedule.  Patient's husband wanted to make sure Tammy P and Dr. Annamaria Boots are aware.  Will route to them as FYI. Nothing further needed.

## 2019-03-04 NOTE — ED Notes (Signed)
Dr. Marlowe Sax at bedside.   Dr. Marlowe Sax informed of elevated D-Dimer 1.69.

## 2019-03-04 NOTE — Progress Notes (Signed)
PROGRESS NOTE                                                                                                                                                                                                             Patient Demographics:    Kayla Brown, is a 68 y.o. female, DOB - 05/06/1951, CEY:[id number]  Admit date - 03/03/2019   Admitting Physician No admitting provider for patient encounter.  Outpatient Primary MD for the patient is [name], Helane Rima, MD  LOS - 0   Chief Complaint  Patient presents with  . Shortness of Breath  . Hypertension       Brief Narrative    This is a new charge note as patient seen admitted admitted earlier today by Dr. Ulyess Blossom   Subjective:    Kayla Brown today has, No headache, No chest pain, No abdominal pain - No Nausea, No new weakness tingling or numbness, she does report some dyspnea   Assessment  & Plan :    Principal Problem:   Acute respiratory failure with hypoxia (HCC) Active Problems:   Essential hypertension, benign   OSA (obstructive sleep apnea)   Pulmonary edema   AKI (acute kidney injury) (HCC)  Acute hypoxic respiratory failure secondary to pulmonary edema Oxygen saturation 88% on room air, placed on 2 L supplemental oxygen.  Tachypneic.  SARS-CoV-2 test negative. Chest x-ray showing cardiac enlargement and mild pulmonary vascular congestion with thickening along the major fissure of the right lung.  Findings thought to reflect early CHF.  No airspace consolidation identified.  Does have JVD on exam.  BNP likely falsely normal given morbid obesity.  Suspect flash pulmonary edema due to significantly elevated blood pressure.  Recent echo done 8/10 with normal systolic function (EF 60 to 65%) and normal diastolic parameters.  D-dimer elevated at 1.69 in the setting of acute renal insufficiency. -Unable to do CT angiogram due to acute renal insufficiency.  Heparin has been started  empirically. Her VQ scan with no evidence of PE, will stop anticoagulation. -Received IV Lasix 40 mg in the ED.  Blood pressure stable.  Continue IV Lasix 40 mg twice daily. -Blood pressure management as mentioned below -Continue supplemental oxygen, wean as tolerated  AKI on CKD 3 BUN 53, creatinine 1.7.  Creatinine 1.0-1.2 three weeks ago.  Suspect related to vascular congestion  from volume overload.   -Continue Lasix as above -Continue to monitor renal function,  -Monitor urine output -Avoid nephrotoxic agents/contrast  Uncontrolled hypertension Systolic as high as 893 recorded in the ED. Received IV hydralazine and blood pressure now improved with systolic in the 810F. Continue home amlodipine -Hydralazine PRN -Hold home ACE inhibitor and diuretics given AKI  Normocytic anemia Hemoglobin 10.7, previously in the 12 range.  No signs of active bleeding. -Check iron, ferritin, TIBC -Check FOBT -Continue to monitor CBC  OSA -CPAP at night  ?UTI UA with small amount of leukocytes, negative nitrite, 21-50 WBCs, and no bacteria seen on microscopic examination.  Patient is not endorsing any UTI symptoms.  Afebrile and no leukocytosis. -Urine culture pending  Poorly controlled non-insulin-dependent diabetes mellitus -Check A1c.  Sliding scale insulin sensitive with bedtime coverage and CBG checks.    COVID-19 Labs  Recent Labs    03/04/19 0110  DDIMER 1.69*    Lab Results  Component Value Date   SARSCOV2NAA NEGATIVE 03/04/2019     Code Status : Full  Family Communication  : D/W patient  Disposition Plan  : Home  Barriers For Discharge : Remains on oxygen, hypervolemic, on IV diuresis  Consults  :  none  Procedures  : None  DVT Prophylaxis  :  Heparin GTT stopped  Lab Results  Component Value Date   PLT 257 03/04/2019    Antibiotics  :    Anti-infectives (From admission, onward)   None        Objective:   Vitals:   03/04/19 0648 03/04/19  0804 03/04/19 1116 03/04/19 1439  BP: 136/61 (!) 154/55 (!) 151/55 (!) 158/55  Pulse: 83  74 78  Resp: (!) 33 (!) 22 (!) 27 (!) 24  Temp:      TempSrc:      SpO2: 91% 94% 93% 98%  Weight:      Height:        Wt Readings from Last 3 Encounters:  03/04/19 102.9 kg  02/16/19 102.9 kg  02/05/19 100.7 kg     Intake/Output Summary (Last 24 hours) at 03/04/2019 1623 Last data filed at 03/04/2019 0811 Gross per 24 hour  Intake -  Output 1000 ml  Net -1000 ml     Physical Exam  Awake Alert, Oriented X 3, No new F.N deficits, Normal affect Symmetrical Chest wall movement, bibasilar crackles RRR,No Gallops,Rubs or new Murmurs, No Parasternal Heave +ve B.Sounds, Abd Soft, No tenderness, No organomegaly appriciated, No rebound - guarding or rigidity. +2 edema    Data Review:    CBC Recent Labs  Lab 03/03/19 1717 03/04/19 0416  WBC 9.6 9.3  HGB 10.7* 10.3*  HCT 33.7* 32.7*  PLT 255 257  MCV 91.8 92.1  MCH 29.2 29.0  MCHC 31.8 31.5  RDW 14.7 14.6  LYMPHSABS 1.2  --   MONOABS 1.1*  --   EOSABS 0.7*  --   BASOSABS 0.0  --     Chemistries  Recent Labs  Lab 03/03/19 1717 03/04/19 0416  NA 137 137  K 4.7 4.8  CL 108 111  CO2 18* 16*  GLUCOSE 357* 329*  BUN 53* 48*  CREATININE 1.79* 1.40*  CALCIUM 9.4 9.3  AST 20  --   ALT 38  --   ALKPHOS 56  --   BILITOT 0.3  --    ------------------------------------------------------------------------------------------------------------------ No results for input(s): CHOL, HDL, LDLCALC, TRIG, CHOLHDL, LDLDIRECT in the last 72 hours.  Lab Results  Component  Value Date   HGBA1C 8.1 (H) 02/06/2019   ------------------------------------------------------------------------------------------------------------------ No results for input(s): TSH, T4TOTAL, T3FREE, THYROIDAB in the last 72 hours.  Invalid input(s): FREET3  ------------------------------------------------------------------------------------------------------------------ No results for input(s): VITAMINB12, FOLATE, FERRITIN, TIBC, IRON, RETICCTPCT in the last 72 hours.  Coagulation profile No results for input(s): INR, PROTIME in the last 168 hours.  Recent Labs    03/04/19 0110  DDIMER 1.69*    Cardiac Enzymes No results for input(s): CKMB, TROPONINI, MYOGLOBIN in the last 168 hours.  Invalid input(s): CK ------------------------------------------------------------------------------------------------------------------    Component Value Date/Time   BNP 50.8 03/03/2019 2357   BNP 25.5 04/08/2015 0753    Inpatient Medications  Scheduled Meds: . amLODipine  10 mg Oral Daily  . atorvastatin  40 mg Oral q1800  . cholecalciferol  2,000 Units Oral Daily  . fluticasone  2 spray Each Nare Daily  . furosemide  40 mg Intravenous BID  . insulin aspart  0-5 Units Subcutaneous QHS  . insulin aspart  0-9 Units Subcutaneous TID WC  . loratadine  10 mg Oral Daily  . Melatonin  3 mg Oral QHS  . multivitamin  1 tablet Oral Daily  . omega-3 acid ethyl esters  1 g Oral Daily  . sertraline  50 mg Oral Daily  . umeclidinium-vilanterol  1 puff Inhalation Daily   Continuous Infusions: . heparin 1,250 Units/hr (03/04/19 0528)   PRN Meds:.acetaminophen **OR** acetaminophen, hydrALAZINE  Micro Results Recent Results (from the past 240 hour(s))  SARS Coronavirus 2 [facility](Hospital order, Performed in [facility] lab) Nasopharyngeal Nasopharyngeal Swab     Status: None   Collection Time: 03/04/19 12:01 AM   Specimen: Nasopharyngeal Swab  Result Value Ref Range Status   SARS Coronavirus 2 NEGATIVE NEGATIVE Final    Comment: (NOTE) If result is NEGATIVE SARS-CoV-2 target nucleic acids are NOT DETECTED. The SARS-CoV-2 RNA is generally detectable in upper and lower  respiratory specimens during the acute phase of infection. The lowest   concentration of SARS-CoV-2 viral copies this assay can detect is 250  copies / mL. A negative result does not preclude SARS-CoV-2 infection  and should not be used as the sole basis for treatment or other  patient management decisions.  A negative result may occur with  improper specimen collection / handling, submission of specimen other  than nasopharyngeal swab, presence of viral mutation(s) within the  areas targeted by this assay, and inadequate number of viral copies  (<250 copies / mL). A negative result must be combined with clinical  observations, patient history, and epidemiological information. If result is POSITIVE SARS-CoV-2 target nucleic acids are DETECTED. The SARS-CoV-2 RNA is generally detectable in upper and lower  respiratory specimens dur ing the acute phase of infection.  Positive  results are indicative of active infection with SARS-CoV-2.  Clinical  correlation with patient history and other diagnostic information is  necessary to determine patient infection status.  Positive results do  not rule out bacterial infection or co-infection with other viruses. If result is PRESUMPTIVE POSTIVE SARS-CoV-2 nucleic acids MAY BE PRESENT.   A presumptive positive result was obtained on the submitted specimen  and confirmed on repeat testing.  While 2019 novel coronavirus  (SARS-CoV-2) nucleic acids may be present in the submitted sample  additional confirmatory testing may be necessary for epidemiological  and / or clinical management purposes  to differentiate between  SARS-CoV-2 and other Sarbecovirus currently known to infect humans.  If clinically indicated additional testing with an alternate  test  methodology [phone]([id number]) is advised. The SARS-CoV-2 RNA is generally  detectable in upper and lower respiratory sp ecimens during the acute  phase of infection. The expected result is Negative. Fact Sheet for Patients:  BoilerBrush.com.cyhttps://www.fda.gov/media/136312/download Fact Sheet  for Healthcare Providers: https://pope.com/https://www.fda.gov/media/136313/download This test is not yet approved or cleared by the [place] FDA and has been authorized for detection and/or diagnosis of SARS-CoV-2 by FDA under an Emergency Use Authorization (EUA).  This EUA will remain in effect (meaning this test can be used) for the duration of the COVID-19 declaration under Section 564(b)(1) of the Act, 21 U.S.C. section 360bbb-3(b)(1), unless the authorization is terminated or revoked sooner. Performed at [facility], [address] [place], [place] [address]     Radiology Reports Dg Chest 2 View  Result Date: 03/03/2019 CLINICAL DATA:  Pneumonia.  Short of breath. EXAM: CHEST - 2 VIEW COMPARISON:  02/17/2019 FINDINGS: Mild cardiac enlargement. Pulmonary vascular congestion identified. Lungs are hyperinflated and there are coarsened interstitial markings noted bilaterally. Pleural thickening along the major fissure is again noted. Scar or atelectasis noted in the right middle lobe. IMPRESSION: Cardiac enlargement and mild pulmonary vascular congestion with thickening along the major fissure of the right lung. Findings may reflect early CHF. No airspace consolidation identified. Electronically Signed   By: Signa Kellaylor  Stroud M.D.   On: 03/03/2019 17:55   Dg Chest 2 View  Result Date: 02/18/2019 CLINICAL DATA:  Dyspnea on exertion.  Pulmonary hypertension. EXAM: CHEST - 2 VIEW COMPARISON:  None. FINDINGS: Normal heart size. Normal mediastinal contour. No pneumothorax. No pleural effusion. Patchy right middle lobe opacity. No pulmonary edema. IMPRESSION: Patchy right middle lobe opacity suggests a pneumonia. Recommend follow-up PA and lateral post treatment chest radiographs in 4-6 weeks. Electronically Signed   By: Delbert PhenixJason A Poff M.D.   On: 02/18/2019 08:47   Nm Pulmonary Perf And Vent  Result Date: 03/04/2019 CLINICAL DATA:  Evaluate for pulmonary embolism. Short of breath with hypertension. EXAM:  NUCLEAR MEDICINE VENTILATION - PERFUSION LUNG SCAN TECHNIQUE: Ventilation images were obtained in multiple projections using inhaled aerosol Tc-6146m DTPA. Perfusion images were obtained in multiple projections after intravenous injection of Tc-4446m MAA. RADIOPHARMACEUTICALS:  32.7 mCi of Tc-4246m DTPA aerosol inhalation and 1.53 mCi Tc4246m MAA IV COMPARISON:  Chest radiograph 03/03/2019 FINDINGS: Ventilation: Large segmental ventilation defect identified involving the superior segment of left lower lobe. Medium to large segmental perfusion defect identified within the lateral left lower lobe. Perfusion: Corresponding perfusion abnormalities are noted within the superior segment of left lower lobe and lateral left lower lobe. IMPRESSION: No unmatched segmental perfusion defects identified to suggest acute pulmonary embolus. Electronically Signed   By: Signa Kellaylor  Stroud M.D.   On: 03/04/2019 15:54    Time Spent in minutes  : No charge   Huey Bienenstockawood Elgergawy M.D on 03/04/2019 at 4:23 PM  Between 7am to 7pm - Pager - [phone]  After 7pm go to [email or web address] - password [email or web address]  [facility] -  Office  [phone]

## 2019-03-04 NOTE — ED Notes (Addendum)
Pt. on hospital bed for comfort at this time.

## 2019-03-04 NOTE — ED Notes (Signed)
Occult blood card at bedside for Dr. Marlowe Sax. Secure message sent.

## 2019-03-04 NOTE — ED Notes (Signed)
Pt's lunch ordered 

## 2019-03-04 NOTE — ED Notes (Signed)
Tele Ordered bfast 

## 2019-03-04 NOTE — ED Notes (Signed)
Pt going back to [facility]

## 2019-03-05 ENCOUNTER — Ambulatory Visit: Payer: Medicare Other | Admitting: Adult Health

## 2019-03-05 ENCOUNTER — Inpatient Hospital Stay (HOSPITAL_COMMUNITY): Payer: Medicare Other

## 2019-03-05 DIAGNOSIS — N179 Acute kidney failure, unspecified: Secondary | ICD-10-CM

## 2019-03-05 DIAGNOSIS — J810 Acute pulmonary edema: Secondary | ICD-10-CM

## 2019-03-05 DIAGNOSIS — J9601 Acute respiratory failure with hypoxia: Secondary | ICD-10-CM

## 2019-03-05 DIAGNOSIS — I10 Essential (primary) hypertension: Secondary | ICD-10-CM

## 2019-03-05 DIAGNOSIS — G4733 Obstructive sleep apnea (adult) (pediatric): Secondary | ICD-10-CM

## 2019-03-05 DIAGNOSIS — I2729 Other secondary pulmonary hypertension: Secondary | ICD-10-CM

## 2019-03-05 LAB — URINE CULTURE: Culture: <10000 — AB

## 2019-03-05 LAB — GLUCOSE, CAPILLARY
Glucose-Capillary: 368 mg/dL — ABNORMAL HIGH (ref 70–99)
Glucose-Capillary: 493 mg/dL — ABNORMAL HIGH (ref 70–99)
Glucose-Capillary: 526 mg/dL (ref 70–99)
Glucose-Capillary: 447 mg/dL — ABNORMAL HIGH (ref 70–99)
Glucose-Capillary: 189 mg/dL — ABNORMAL HIGH (ref 70–99)

## 2019-03-05 LAB — BLOOD GAS, ARTERIAL
Acid-base deficit: 2.8 mmol/L — ABNORMAL HIGH (ref 0.0–2.0)
Bicarbonate: 20.6 mmol/L (ref 20.0–28.0)
Drawn by: 519031
FIO2: 40
O2 Saturation: 94 %
Patient temperature: 99.7
pCO2 arterial: 30.9 mmHg — ABNORMAL LOW (ref 32.0–48.0)
pH, Arterial: 7.442 (ref 7.350–7.450)
pO2, Arterial: 73 mmHg — ABNORMAL LOW (ref 83.0–108.0)

## 2019-03-05 LAB — RESPIRATORY PANEL BY PCR

## 2019-03-05 LAB — CBC
HCT: 33.9 % — ABNORMAL LOW (ref 36.0–46.0)
Hemoglobin: 11.1 g/dL — ABNORMAL LOW (ref 12.0–15.0)
MCH: 29.1 pg (ref 26.0–34.0)
MCHC: 32.7 g/dL (ref 30.0–36.0)
MCV: 89 fL (ref 80.0–100.0)
Platelets: 309 10*3/uL (ref 150–400)
RBC: 3.81 MIL/uL — ABNORMAL LOW (ref 3.87–5.11)
RDW: 14.4 % (ref 11.5–15.5)
WBC: 9.9 10*3/uL (ref 4.0–10.5)
nRBC: 0 % (ref 0.0–0.2)

## 2019-03-05 LAB — BASIC METABOLIC PANEL
Anion gap: 11 (ref 5–15)
BUN: 47 mg/dL — ABNORMAL HIGH (ref 8–23)
CO2: 18 mmol/L — ABNORMAL LOW (ref 22–32)
Calcium: 9.4 mg/dL (ref 8.9–10.3)
Chloride: 104 mmol/L (ref 98–111)
Creatinine, Ser: 1.45 mg/dL — ABNORMAL HIGH (ref 0.44–1.00)
GFR calc Af Amer: 43 mL/min — ABNORMAL LOW (ref >60)
GFR calc non Af Amer: 37 mL/min — ABNORMAL LOW (ref >60)
Glucose, Bld: 374 mg/dL — ABNORMAL HIGH (ref 70–99)
Potassium: 4.6 mmol/L (ref 3.5–5.1)
Sodium: 133 mmol/L — ABNORMAL LOW (ref 135–145)

## 2019-03-05 LAB — HEMOGLOBIN A1C
Hgb A1c MFr Bld: 7.8 % — ABNORMAL HIGH (ref 4.8–5.6)
Mean Plasma Glucose: 177 mg/dL

## 2019-03-05 LAB — SEDIMENTATION RATE: Sed Rate: 53 mm/hr — ABNORMAL HIGH (ref 0–22)

## 2019-03-05 LAB — D-DIMER, QUANTITATIVE: D-Dimer, Quant: 1.65 ug/mL-FEU — ABNORMAL HIGH (ref 0.00–0.50)

## 2019-03-05 LAB — LACTATE DEHYDROGENASE: LDH: 248 U/L — ABNORMAL HIGH (ref 98–192)

## 2019-03-05 LAB — PROCALCITONIN: Procalcitonin: 0.17 ng/mL

## 2019-03-05 LAB — SARS CORONAVIRUS 2 BY RT PCR (HOSPITAL ORDER, PERFORMED IN ~~LOC~~ HOSPITAL LAB): SARS Coronavirus 2: NEGATIVE

## 2019-03-05 LAB — C-REACTIVE PROTEIN: CRP: 1.7 mg/dL — ABNORMAL HIGH (ref <1.0)

## 2019-03-05 LAB — FERRITIN: Ferritin: 62 ng/mL (ref 11–307)

## 2019-03-05 MED ORDER — INSULIN ASPART 100 UNIT/ML ~~LOC~~ SOLN
20.0000 [IU] | Freq: Once | SUBCUTANEOUS | Status: AC
  Administered 2019-03-05: 20 [IU] via SUBCUTANEOUS

## 2019-03-05 MED ORDER — INSULIN GLARGINE 100 UNIT/ML ~~LOC~~ SOLN
5.0000 [IU] | Freq: Every day | SUBCUTANEOUS | Status: DC
  Administered 2019-03-05: 5 [IU] via SUBCUTANEOUS
  Filled 2019-03-05: qty 0.05

## 2019-03-05 MED ORDER — INSULIN ASPART 100 UNIT/ML ~~LOC~~ SOLN
0.0000 [IU] | Freq: Three times a day (TID) | SUBCUTANEOUS | Status: DC
  Administered 2019-03-05: 15 [IU] via SUBCUTANEOUS
  Administered 2019-03-06: 8 [IU] via SUBCUTANEOUS
  Administered 2019-03-07: 15 [IU] via SUBCUTANEOUS
  Administered 2019-03-07 (×2): 11 [IU] via SUBCUTANEOUS
  Administered 2019-03-08 (×3): 15 [IU] via SUBCUTANEOUS
  Administered 2019-03-09: 8 [IU] via SUBCUTANEOUS
  Administered 2019-03-10 (×2): 3 [IU] via SUBCUTANEOUS
  Administered 2019-03-11 (×2): 8 [IU] via SUBCUTANEOUS
  Administered 2019-03-11: 3 [IU] via SUBCUTANEOUS
  Administered 2019-03-12: 8 [IU] via SUBCUTANEOUS
  Administered 2019-03-12: 5 [IU] via SUBCUTANEOUS
  Administered 2019-03-13: 2 [IU] via SUBCUTANEOUS

## 2019-03-05 MED ORDER — INSULIN GLARGINE 100 UNIT/ML ~~LOC~~ SOLN: 10.0000 [IU] | Freq: Every day | SUBCUTANEOUS | Status: DC

## 2019-03-05 MED ORDER — INSULIN ASPART 100 UNIT/ML ~~LOC~~ SOLN
30.0000 [IU] | Freq: Once | SUBCUTANEOUS | Status: AC
  Administered 2019-03-05: 30 [IU] via SUBCUTANEOUS

## 2019-03-05 MED ORDER — FUROSEMIDE 10 MG/ML IJ SOLN
60.0000 mg | Freq: Once | INTRAMUSCULAR | Status: AC
  Administered 2019-03-05: 60 mg via INTRAVENOUS
  Filled 2019-03-05: qty 6

## 2019-03-05 MED ORDER — INSULIN GLARGINE 100 UNIT/ML ~~LOC~~ SOLN
10.0000 [IU] | Freq: Once | SUBCUTANEOUS | Status: AC
  Administered 2019-03-05: 10 [IU] via SUBCUTANEOUS
  Filled 2019-03-05: qty 0.1

## 2019-03-05 MED ORDER — CHLORTHALIDONE 25 MG PO TABS
25.0000 mg | ORAL_TABLET | Freq: Every day | ORAL | Status: DC
  Administered 2019-03-05 – 2019-03-10 (×6): 25 mg via ORAL
  Filled 2019-03-05 (×7): qty 1

## 2019-03-05 MED ORDER — HYDRALAZINE HCL 25 MG PO TABS
25.0000 mg | ORAL_TABLET | Freq: Four times a day (QID) | ORAL | Status: DC
  Administered 2019-03-05 – 2019-03-11 (×20): 25 mg via ORAL
  Filled 2019-03-05 (×23): qty 1

## 2019-03-05 MED ORDER — INSULIN GLARGINE 100 UNIT/ML ~~LOC~~ SOLN
10.0000 [IU] | Freq: Two times a day (BID) | SUBCUTANEOUS | Status: DC
  Administered 2019-03-05 – 2019-03-06 (×2): 10 [IU] via SUBCUTANEOUS
  Filled 2019-03-05 (×3): qty 0.1

## 2019-03-05 NOTE — Consult Note (Signed)
NAME:  Kayla MirzaVickie Brown, MRN:  [id number], DOB:  01/05/1951, LOS: 1 ADMISSION DATE:  03/03/2019, CONSULTATION DATE:  03/05/2019 REFERRING MD:  [facility] - [place], CHIEF COMPLAINT:  Acute hypoxemic respiratory failure   Brief History   68 year old female with PMH of morbid obesity, OSA and pulmonary HTN presenting to PCCM with acute hypoxemic respiratory failure.  Patient initially had pulmonary edema and was diuresed but inspite of that continued to have increase in O2 demand and PCCM was called on consultation.  Today patient report feeling better and has no additional complaints.  No history of auto-immune disorders, no symptoms of infection and no history of need for O2 in the past.  History of present illness   68 year old female with PMH of morbid obesity, OSA and pulmonary HTN presenting to PCCM with acute hypoxemic respiratory failure.  Patient initially had pulmonary edema and was diuresed but inspite of that continued to have increase in O2 demand and PCCM was called on consultation.  Today patient report feeling better and has no additional complaints.  No history of auto-immune disorders, no symptoms of infection and no history of need for O2 in the past.  Past Medical History  OSA OHV Pulmonary HTN Pulmonary edema  Significant Hospital Events   VQ scan negative  Consults:  PCCM  Procedures:  None  Significant Diagnostic Tests:  V/Q negative on 9/9  Micro Data:  N/A  Antimicrobials:  N/A   Interim history/subjective:  Feels better this AM  Objective   Blood pressure (!) 156/59, pulse 87, temperature 97.7 F (36.5 C), temperature source Oral, resp. rate (!) 39, height 5' (1.524 m), weight 100.7 kg, last menstrual period 06/25/2008, SpO2 95 %.        Intake/Output Summary (Last 24 hours) at 03/05/2019 1740 Last data filed at 03/05/2019 1700 Gross per 24 hour  Intake 640 ml  Output 2800 ml  Net -2160 ml   Filed Weights   03/04/19 0400 03/05/19 0354  Weight: 102.9  kg 100.7 kg    Examination: General: Chronically ill appearing female, NAD HENT: [place]/AT, PERRL, EOM-I and MMM Lungs: Coarse diffusely Cardiovascular: RRR, Nl S1/S2 and -M/R/G Abdomen: Obese, soft, NT, ND and +BS Extremities: -edema and -tenderness Neuro: Intact Skin: Intact  I reviewed CXR myself, pulmonary edema noted.  Resolved Hospital Problem list   N/A  Assessment & Plan:  68 year old with acute hypoxemic respiratory failure.  Discussed with [facility]-MD and PCCM-NP.  Hypoxemia:  - Titrate O2 for sat of 88-92%  - Will need an ambulatory desaturation study prior to discharge for home O2  Acute pulmonary edema:  - Continue diureses  - 2D echo  Pulmonary HTN:  - Repeat echo  - Auto-immune work up sent  - If procal is negative will consider steroids  OSA:  - Will need an outpatient sleep study  - Will need CPAP  Labs   CBC: Recent Labs  Lab 03/03/19 1717 03/04/19 0416 03/05/19 0353  WBC 9.6 9.3 9.9  NEUTROABS 6.5  --   --   HGB 10.7* 10.3* 11.1*  HCT 33.7* 32.7* 33.9*  MCV 91.8 92.1 89.0  PLT 255 257 309    Basic Metabolic Panel: Recent Labs  Lab 03/03/19 1717 03/04/19 0416 03/05/19 0353  NA 137 137 133*  K 4.7 4.8 4.6  CL 108 111 104  CO2 18* 16* 18*  GLUCOSE 357* 329* 374*  BUN 53* 48* 47*  CREATININE 1.79* 1.40* 1.45*  CALCIUM 9.4  9.3 9.4   GFR: Estimated Creatinine Clearance: 40.7 mL/min (A) (by C-G formula based on SCr of 1.45 mg/dL (H)). Recent Labs  Lab 03/03/19 1717 03/04/19 0416 03/05/19 0353  WBC 9.6 9.3 9.9    Liver Function Tests: Recent Labs  Lab 03/03/19 1717  AST 20  ALT 38  ALKPHOS 56  BILITOT 0.3  PROT 6.2*  ALBUMIN 3.0*   No results for input(s): LIPASE, AMYLASE in the last 168 hours. No results for input(s): AMMONIA in the last 168 hours.  ABG    Component Value Date/Time   PHART 7.442 03/05/2019 1533   PCO2ART 30.9 (L) 03/05/2019 1533   PO2ART 73.0 (L) 03/05/2019 1533   HCO3 20.6 03/05/2019 1533    ACIDBASEDEF 2.8 (H) 03/05/2019 1533   O2SAT 94.0 03/05/2019 1533     Coagulation Profile: No results for input(s): INR, PROTIME in the last 168 hours.  Cardiac Enzymes: No results for input(s): CKTOTAL, CKMB, CKMBINDEX, TROPONINI in the last 168 hours.  HbA1C: Hemoglobin A1C  Date/Time Value Ref Range Status  02/06/2016 6.7  Final   Hgb A1c MFr Bld  Date/Time Value Ref Range Status  03/04/2019 05:00 AM 7.8 (H) 4.8 - 5.6 % Final    Comment:    (NOTE)         Prediabetes: 5.7 - 6.4         Diabetes: >6.4         Glycemic control for adults with diabetes: <7.0   02/06/2019 01:40 PM 8.1 (H) 4.6 - 6.5 % Final    Comment:    Glycemic Control Guidelines for People with Diabetes:Non Diabetic:  <6%Goal of Therapy: <7%Additional Action Suggested:  >8%     CBG: Recent Labs  Lab 03/04/19 2109 03/05/19 0750 03/05/19 1138 03/05/19 1141 03/05/19 1520  GLUCAP 330* 368* 493* 526* 447*    Review of Systems:   12 point ROS is negative other than above  Past Medical History  She,  has a past medical history of Anxiety, Diabetes mellitus, Edema of extremities (03/02/2016), Essential hypertension, benign, Lazy eye, Major depressive disorder, single episode, unspecified, Obesity (BMI 30-39.9) (03/01/2016), OSA (obstructive sleep apnea), Pulmonary HTN (HCC), Pure hypercholesterolemia, and Retinopathy, due to hypertension.   Surgical History    Past Surgical History:  Procedure Laterality Date  . CPAP TITRATION  10/14/2015  . DILATION AND CURETTAGE OF UTERUS  2005-2008   x2  . EYE SURGERY     cataracts  . WISDOM TOOTH EXTRACTION       Social History   reports that she has never smoked. She has never used smokeless tobacco. She reports that she does not drink alcohol or use drugs.   Family History   Her family history includes ADD / ADHD in her mother; Alcohol abuse in her mother; Dementia in her mother. There is no history of Breast cancer. She was adopted.   Allergies No Known  Allergies   Home Medications  Prior to Admission medications   Medication Sig Start Date End Date Taking? Authorizing Provider  amLODipine (NORVASC) 10 MG tablet TAKE 1 TABLET BY MOUTH EVERY DAY Patient taking differently: Take 10 mg by mouth daily.  02/23/19  Yes Sheliah Hatchabori, Katherine E, MD  amoxicillin-clavulanate (AUGMENTIN) 875-125 MG tablet Take 1 tablet by mouth 2 (two) times daily for 7 days. 02/24/19 03/03/28 Yes Parrett, Tammy S, NP  atorvastatin (LIPITOR) 40 MG tablet TAKE 1 TABLET BY MOUTH EVERY DAY Patient taking differently: Take 40 mg by mouth daily at 6  PM.  12/01/18  Yes [name], MD  chlorthalidone (HYGROTON) 50 MG tablet Take 1 tablet (50 mg total) by mouth daily. Patient taking differently: Take 25 mg by mouth daily.  01/22/19  Yes Turner, Eber Hong, MD  Cholecalciferol (VITAMIN D PO) Take 2,000 Units by mouth daily.    Yes [provider]  empagliflozin (JARDIANCE) 10 MG TABS tablet Take 10 mg by mouth daily before breakfast. 02/17/19  Yes [name], MD  fish oil-omega-3 fatty acids 1000 MG capsule Take 1 g by mouth daily.   Yes [provider]  fluticasone (FLONASE) 50 MCG/ACT nasal spray SPRAY 2 SPRAYS INTO EACH NOSTRIL EVERY DAY Patient taking differently: Place 2 sprays into both nostrils daily.  03/26/18  Yes [name], MD  glipiZIDE (GLUCOTROL XL) 10 MG 24 hr tablet TAKE 1 TABLET BY MOUTH ONCE DAILY WITH BREAKFAST Patient taking differently: Take 10 mg by mouth daily with breakfast.  02/23/19  Yes [name], MD  hydrALAZINE (APRESOLINE) 25 MG tablet Take 1 tablet (25 mg total) by mouth 3 (three) times daily. 02/09/19  Yes Sherren Mocha, MD  hydrochlorothiazide (MICROZIDE) 12.5 MG capsule Take 12.5 mg by mouth 2 (two) times daily. 02/23/19  Yes [provider]  loratadine (CLARITIN) 10 MG tablet TAKE 1 TABLET BY MOUTH EVERY DAY Patient taking differently: Take 10 mg by mouth daily.  02/06/18  Yes [name], MD   meclizine (ANTIVERT) 25 MG tablet Take 1 tablet (25 mg total) by mouth 3 (three) times daily as needed for dizziness. 10/02/16  Yes [name], MD  Melatonin 3 MG TABS Take 3 mg by mouth at bedtime.   Yes [provider]  metFORMIN (GLUCOPHAGE-XR) 500 MG 24 hr tablet TAKE 2 TABLETS BY MOUTH TWICE DAILY BEFORE A MEAL Patient taking differently: Take 1,000 mg by mouth 2 (two) times daily.  01/06/19  Yes [name], MD  Multiple Vitamins-Minerals (VISION FORMULA) TABS Take 1 tablet by mouth daily.    Yes [provider]  OVER THE COUNTER MEDICATION Take 1 tablet by mouth daily. Equate Vision Formula 50+    Yes [provider]  pioglitazone (ACTOS) 30 MG tablet TAKE 1 TABLET BY MOUTH EVERY DAY Patient taking differently: Take 30 mg by mouth daily.  02/23/19  Yes [name], MD  ramipril (ALTACE) 10 MG capsule TAKE 2 CAPSULES BY MOUTH EVERY DAY Patient taking differently: Take 20 mg by mouth daily.  01/06/19  Yes [name], MD  sertraline (ZOLOFT) 50 MG tablet TAKE 1 TABLET BY MOUTH EVERY DAY Patient taking differently: Take 50 mg by mouth daily.  02/16/19  Yes [name], MD  spironolactone (ALDACTONE) 25 MG tablet TAKE 1/2 TABLET BY MOUTH EVERY DAY Patient taking differently: Take 12.5 mg by mouth daily.  01/19/19  Yes [name], MD  umeclidinium-vilanterol (ANORO ELLIPTA) 62.5-25 MCG/INH AEPB Inhale 1 puff into the lungs daily. 02/16/19  Yes Baird Lyons D, MD  Alcohol Swabs (ALCOHOL PREP) PADS Pt uses an alcohol pad each time sugars are tested. Pt tests twice daily. Dx E11.9 02/06/17   [name], MD  glucose blood (FREESTYLE LITE) test strip USE TO TEST BLOOD GLUCOSE 2 TIMES DAILY. Dx E11.9 10/16/17   [name], MD  Suvorexant (BELSOMRA) 10 MG TABS Take 10 mg by mouth at bedtime. Patient not taking: Reported on 02/16/2019 04/01/18   Brunetta Jeans, PA-C  TRUEPLUS LANCETS 30G MISC Use one  lancet each  time sugars are tested. Pt tests twice daily. Dx. E11.9 02/06/17   Sheliah Hatch, MD    Alyson Reedy, M.D. [facility] Pulmonary/Critical Care Medicine. Pager: [phone]. After hours pager: [phone].

## 2019-03-05 NOTE — Progress Notes (Signed)
PROGRESS NOTE                                                                                                                                                                                                             Patient Demographics:    Kayla Brown, is a 68 y.o. female, DOB - 03/23/1951, ZOX:[id number]  Admit date - 03/03/2019   Admitting Physician John GiovanniVasundhra Rathore, MD  Outpatient Primary MD for the patient is Beverely Lowabori, Helane RimaKatherine E, MD  LOS - 1   Chief Complaint  Patient presents with  . Shortness of Breath  . Hypertension       Brief Narrative    68 y.o. female with medical history significant of hypertension, pulmonary hypertension, diabetes, morbid obesity, obstructive sleep apnea presenting to the hospital for evaluation of shortness of breath and hypertension, work-up significant for hypoxia, volume overload, she was admitted for IV diuresis.    Subjective:    Kayla Brown today reports she is feeling better, denies any chest pain, nausea or vomiting, she still reports some dyspnea, no cough .   Assessment  & Plan :    Principal Problem:   Acute respiratory failure with hypoxia (HCC) Active Problems:   Essential hypertension, benign   OSA (obstructive sleep apnea)   Pulmonary edema   AKI (acute kidney injury) (HCC)  Acute hypoxic respiratory failure secondary to pulmonary edema -, Requiring oxygen, this morning she has worsening hypoxia, currently on 5 L nasal cannula, I will repeat her chest x-ray, will get her incentive spirometry and encouraged to use . -This is most likely in the setting of volume overload especially with elevated proBNP, and edema and imaging findings of . -Continue with IV diuresis, encouraged to use incentive spirometry . -VQ scan with no evidence of PE, heparin GTT has been stopped .  AKI on CKD 3 -Baseline creatinine 1.2, was 1.7 on presentation, improving with IV diuresis . -Avoid nephrotoxic  medications   Uncontrolled hypertension -Blood pressure better improved, continue with amlodipine, I have started hydralazine 25 mg 4 times daily, continue with PRN hydralazine -Hold home ACE inhibitor and diuretics given AKI  OSA -CPAP at night  UTI UA with small amount of leukocytes, negative nitrite, 21-50 WBCs, and no bacteria seen on microscopic examination.  Patient is not endorsing any UTI symptoms.  Afebrile and no leukocytosis. -  Urine culture pending  Diabetes mellitus -Currently uncontrolled, will check A1c, have started on Lantus 10 units subcu daily, and moderate sliding scale    COVID-19 Labs  Recent Labs    03/04/19 0110  DDIMER 1.69*    Lab Results  Component Value Date   SARSCOV2NAA NEGATIVE 03/04/2019     Code Status : Full  Family Communication  : D/W patient  Disposition Plan  : Home  Barriers For Discharge : Remains on oxygen, hypervolemic, on IV diuresis  Consults  :  none  Procedures  : None  DVT Prophylaxis  :  Heparin GTT stopped  Lab Results  Component Value Date   PLT 309 03/05/2019    Antibiotics  :    Anti-infectives (From admission, onward)   None        Objective:   Vitals:   03/05/19 1100 03/05/19 1129 03/05/19 1135 03/05/19 1139  BP:      Pulse:      Resp: (!) 31 20 (!) 32 20  Temp:      TempSrc:      SpO2: (!) 88% 91% (!) 88% 92%  Weight:      Height:        Wt Readings from Last 3 Encounters:  03/05/19 100.7 kg  02/16/19 102.9 kg  02/05/19 100.7 kg     Intake/Output Summary (Last 24 hours) at 03/05/2019 1400 Last data filed at 03/05/2019 1300 Gross per 24 hour  Intake 823.91 ml  Output 1250 ml  Net -426.09 ml     Physical Exam  Awake Alert, Oriented X 3, No new F.N deficits, Normal affect Symmetrical Chest wall movement, improved air entry at the bases, with minimal crackles RRR,No Gallops,Rubs or new Murmurs, No Parasternal Heave +ve B.Sounds, Abd Soft, No tenderness, No rebound -  guarding or rigidity. No Cyanosis, Clubbing, +1 edema, No new Rash or bruise       Data Review:    CBC Recent Labs  Lab 03/03/19 1717 03/04/19 0416 03/05/19 0353  WBC 9.6 9.3 9.9  HGB 10.7* 10.3* 11.1*  HCT 33.7* 32.7* 33.9*  PLT 255 257 309  MCV 91.8 92.1 89.0  MCH 29.2 29.0 29.1  MCHC 31.8 31.5 32.7  RDW 14.7 14.6 14.4  LYMPHSABS 1.2  --   --   MONOABS 1.1*  --   --   EOSABS 0.7*  --   --   BASOSABS 0.0  --   --     Chemistries  Recent Labs  Lab 03/03/19 1717 03/04/19 0416 03/05/19 0353  NA 137 137 133*  K 4.7 4.8 4.6  CL 108 111 104  CO2 18* 16* 18*  GLUCOSE 357* 329* 374*  BUN 53* 48* 47*  CREATININE 1.79* 1.40* 1.45*  CALCIUM 9.4 9.3 9.4  AST 20  --   --   ALT 38  --   --   ALKPHOS 56  --   --   BILITOT 0.3  --   --    ------------------------------------------------------------------------------------------------------------------ No results for input(s): CHOL, HDL, LDLCALC, TRIG, CHOLHDL, LDLDIRECT in the last 72 hours.  Lab Results  Component Value Date   HGBA1C 7.8 (H) 03/04/2019   ------------------------------------------------------------------------------------------------------------------ No results for input(s): TSH, T4TOTAL, T3FREE, THYROIDAB in the last 72 hours.  Invalid input(s): FREET3 ------------------------------------------------------------------------------------------------------------------ No results for input(s): VITAMINB12, FOLATE, FERRITIN, TIBC, IRON, RETICCTPCT in the last 72 hours.  Coagulation profile No results for input(s): INR, PROTIME in the last 168 hours.  Recent Labs  03/04/19 0110  DDIMER 1.69*    Cardiac Enzymes No results for input(s): CKMB, TROPONINI, MYOGLOBIN in the last 168 hours.  Invalid input(s): CK ------------------------------------------------------------------------------------------------------------------    Component Value Date/Time   BNP 50.8 03/03/2019 2357   BNP 25.5  04/08/2015 0753    Inpatient Medications  Scheduled Meds: . amLODipine  10 mg Oral Daily  . atorvastatin  40 mg Oral q1800  . cholecalciferol  2,000 Units Oral Daily  . fluticasone  2 spray Each Nare Daily  . furosemide  40 mg Intravenous BID  . hydrALAZINE  25 mg Oral Q6H  . insulin aspart  0-15 Units Subcutaneous TID WC  . insulin aspart  0-5 Units Subcutaneous QHS  . [START ON 03/06/2019] insulin glargine  10 Units Subcutaneous Daily  . loratadine  10 mg Oral Daily  . Melatonin  3 mg Oral QHS  . multivitamin  1 tablet Oral Daily  . omega-3 acid ethyl esters  1 g Oral Daily  . sertraline  50 mg Oral Daily  . umeclidinium-vilanterol  1 puff Inhalation Daily   Continuous Infusions:  PRN Meds:.acetaminophen **OR** acetaminophen, hydrALAZINE  Micro Results Recent Results (from the past 240 hour(s))  SARS Coronavirus 2 [facility] order, Performed in [facility] hospital lab) Nasopharyngeal Nasopharyngeal Swab     Status: None   Collection Time: 03/04/19 12:01 AM   Specimen: Nasopharyngeal Swab  Result Value Ref Range Status   SARS Coronavirus 2 NEGATIVE NEGATIVE Final    Comment: (NOTE) If result is NEGATIVE SARS-CoV-2 target nucleic acids are NOT DETECTED. The SARS-CoV-2 RNA is generally detectable in upper and lower  respiratory specimens during the acute phase of infection. The lowest  concentration of SARS-CoV-2 viral copies this assay can detect is 250  copies / mL. A negative result does not preclude SARS-CoV-2 infection  and should not be used as the sole basis for treatment or other  patient management decisions.  A negative result may occur with  improper specimen collection / handling, submission of specimen other  than nasopharyngeal swab, presence of viral mutation(s) within the  areas targeted by this assay, and inadequate number of viral copies  (<250 copies / mL). A negative result must be combined with clinical  observations, patient history, and  epidemiological information. If result is POSITIVE SARS-CoV-2 target nucleic acids are DETECTED. The SARS-CoV-2 RNA is generally detectable in upper and lower  respiratory specimens dur ing the acute phase of infection.  Positive  results are indicative of active infection with SARS-CoV-2.  Clinical  correlation with patient history and other diagnostic information is  necessary to determine patient infection status.  Positive results do  not rule out bacterial infection or co-infection with other viruses. If result is PRESUMPTIVE POSTIVE SARS-CoV-2 nucleic acids MAY BE PRESENT.   A presumptive positive result was obtained on the submitted specimen  and confirmed on repeat testing.  While 2019 novel coronavirus  (SARS-CoV-2) nucleic acids may be present in the submitted sample  additional confirmatory testing may be necessary for epidemiological  and / or clinical management purposes  to differentiate between  SARS-CoV-2 and other Sarbecovirus currently known to infect humans.  If clinically indicated additional testing with an alternate test  methodology [phone]) is advised. The SARS-CoV-2 RNA is generally  detectable in upper and lower respiratory sp ecimens during the acute  phase of infection. The expected result is Negative. Fact Sheet for Patients:  StrictlyIdeas.no Fact Sheet for Healthcare Providers: BankingDealers.co.za This test is not yet approved or  cleared by the [place] FDA and has been authorized for detection and/or diagnosis of SARS-CoV-2 by FDA under an Emergency Use Authorization (EUA).  This EUA will remain in effect (meaning this test can be used) for the duration of the COVID-19 declaration under Section 564(b)(1) of the Act, 21 U.S.C. section 360bbb-3(b)(1), unless the authorization is terminated or revoked sooner. Performed at [facility], [address] [place], [place] [address]   Urine Culture      Status: Abnormal   Collection Time: 03/04/19  8:47 AM   Specimen: Urine, Random  Result Value Ref Range Status   Specimen Description URINE, RANDOM  Final   Special Requests NONE  Final   Culture (A)  Final    <10,000 COLONIES/mL INSIGNIFICANT GROWTH Performed at [facility], [address] [place], [place] [address]    Report Status 03/05/2019 FINAL  Final    Radiology Reports Dg Chest 2 View  Result Date: 03/03/2019 CLINICAL DATA:  Pneumonia.  Short of breath. EXAM: CHEST - 2 VIEW COMPARISON:  02/17/2019 FINDINGS: Mild cardiac enlargement. Pulmonary vascular congestion identified. Lungs are hyperinflated and there are coarsened interstitial markings noted bilaterally. Pleural thickening along the major fissure is again noted. Scar or atelectasis noted in the right middle lobe. IMPRESSION: Cardiac enlargement and mild pulmonary vascular congestion with thickening along the major fissure of the right lung. Findings may reflect early CHF. No airspace consolidation identified. Electronically Signed   By: Signa Kellaylor  Stroud M.D.   On: 03/03/2019 17:55   Dg Chest 2 View  Result Date: 02/18/2019 CLINICAL DATA:  Dyspnea on exertion.  Pulmonary hypertension. EXAM: CHEST - 2 VIEW COMPARISON:  None. FINDINGS: Normal heart size. Normal mediastinal contour. No pneumothorax. No pleural effusion. Patchy right middle lobe opacity. No pulmonary edema. IMPRESSION: Patchy right middle lobe opacity suggests a pneumonia. Recommend follow-up PA and lateral post treatment chest radiographs in 4-6 weeks. Electronically Signed   By: Delbert PhenixJason A Poff M.D.   On: 02/18/2019 08:47   Nm Pulmonary Perf And Vent  Result Date: 03/04/2019 CLINICAL DATA:  Evaluate for pulmonary embolism. Short of breath with hypertension. EXAM: NUCLEAR MEDICINE VENTILATION - PERFUSION LUNG SCAN TECHNIQUE: Ventilation images were obtained in multiple projections using inhaled aerosol Tc-725m DTPA. Perfusion images were obtained in multiple  projections after intravenous injection of Tc-125m MAA. RADIOPHARMACEUTICALS:  32.7 mCi of Tc-2625m DTPA aerosol inhalation and 1.53 mCi Tc725m MAA IV COMPARISON:  Chest radiograph 03/03/2019 FINDINGS: Ventilation: Large segmental ventilation defect identified involving the superior segment of left lower lobe. Medium to large segmental perfusion defect identified within the lateral left lower lobe. Perfusion: Corresponding perfusion abnormalities are noted within the superior segment of left lower lobe and lateral left lower lobe. IMPRESSION: No unmatched segmental perfusion defects identified to suggest acute pulmonary embolus. Electronically Signed   By: Signa Kellaylor  Stroud M.D.   On: 03/04/2019 15:54     Huey Bienenstockawood Brynda Heick M.D on 03/05/2019 at 2:00 PM  Between 7am to 7pm - Pager - [phone]  After 7pm go to [email or web address] - password [email or web address]  [facility] -  Office  [phone]

## 2019-03-05 NOTE — Progress Notes (Signed)
Inpatient Diabetes Program Recommendations  AACE/ADA: New Consensus Statement on Inpatient Glycemic Control   Target Ranges:  Prepandial:   less than 140 mg/dL      Peak postprandial:   less than 180 mg/dL (1-2 hours)      Critically ill patients:  140 - 180 mg/dL   Results for Kayla Brown, Kayla Brown (MRN [id number]) as of 03/05/2019 11:28  Ref. Range 03/04/2019 08:02 03/04/2019 11:12 03/04/2019 17:51 03/04/2019 21:09 03/05/2019 07:50  Glucose-Capillary Latest Ref Range: 70 - 99 mg/dL 312 (H) 299 (H) 229 (H) 330 (H) 368 (H)  Results for Kayla Brown, Kayla Brown (MRN [id number]) as of 03/05/2019 11:28  Ref. Range 03/04/2019 05:00  Hemoglobin A1C Latest Ref Range: 4.8 - 5.6 % 7.8 (H)   Review of Glycemic Control  Diabetes history: DM2 Outpatient Diabetes medications: Jardiance 10 mg QAM, Glipizide XL 10 mg QAM, Metformin XR 1000 mg BID, Actos 30 mg daily Current orders for Inpatient glycemic control: Lantus 5 units daily, Novolog 0-9 units TID with meals, Novolog 0-5 units QHS  Inpatient Diabetes Program Recommendations:   Insulin-Basal: Please consider increasing Lantus to 10 units daily. If MD agreeable, please also order Lantus 5 units x1 now for total of 10 units today.  Insulin-Meal Coverage: Please consider ordering Novolog 4 units TID with meals for meal coverage if patient eats at least 50% of meals.  Thanks, Barnie Alderman, RN, MSN, CDE Diabetes Coordinator Inpatient Diabetes Program [phone] (Team Pager from 8am to 5pm)

## 2019-03-06 ENCOUNTER — Encounter (HOSPITAL_COMMUNITY): Payer: Self-pay

## 2019-03-06 ENCOUNTER — Ambulatory Visit: Payer: Medicare Other | Admitting: Adult Health

## 2019-03-06 ENCOUNTER — Inpatient Hospital Stay (HOSPITAL_COMMUNITY): Payer: Medicare Other

## 2019-03-06 DIAGNOSIS — I272 Pulmonary hypertension, unspecified: Secondary | ICD-10-CM

## 2019-03-06 DIAGNOSIS — J9811 Atelectasis: Secondary | ICD-10-CM

## 2019-03-06 LAB — CBC
HCT: 33.6 % — ABNORMAL LOW (ref 36.0–46.0)
Hemoglobin: 11.3 g/dL — ABNORMAL LOW (ref 12.0–15.0)
MCH: 29.2 pg (ref 26.0–34.0)
MCHC: 33.6 g/dL (ref 30.0–36.0)
MCV: 86.8 fL (ref 80.0–100.0)
Platelets: 349 10*3/uL (ref 150–400)
RBC: 3.87 MIL/uL (ref 3.87–5.11)
RDW: 14.3 % (ref 11.5–15.5)
WBC: 11.1 10*3/uL — ABNORMAL HIGH (ref 4.0–10.5)
nRBC: 0 % (ref 0.0–0.2)

## 2019-03-06 LAB — BASIC METABOLIC PANEL
Anion gap: 13 (ref 5–15)
BUN: 51 mg/dL — ABNORMAL HIGH (ref 8–23)
CO2: 21 mmol/L — ABNORMAL LOW (ref 22–32)
Calcium: 9.4 mg/dL (ref 8.9–10.3)
Chloride: 101 mmol/L (ref 98–111)
Creatinine, Ser: 1.54 mg/dL — ABNORMAL HIGH (ref 0.44–1.00)
GFR calc Af Amer: 40 mL/min — ABNORMAL LOW (ref >60)
GFR calc non Af Amer: 35 mL/min — ABNORMAL LOW (ref >60)
Glucose, Bld: 266 mg/dL — ABNORMAL HIGH (ref 70–99)
Potassium: 3.9 mmol/L (ref 3.5–5.1)
Sodium: 135 mmol/L (ref 135–145)

## 2019-03-06 LAB — GLUCOSE, CAPILLARY
Glucose-Capillary: 287 mg/dL — ABNORMAL HIGH (ref 70–99)
Glucose-Capillary: 571 mg/dL (ref 70–99)
Glucose-Capillary: 429 mg/dL — ABNORMAL HIGH (ref 70–99)
Glucose-Capillary: 235 mg/dL — ABNORMAL HIGH (ref 70–99)

## 2019-03-06 LAB — ANA W/REFLEX IF POSITIVE: Anti Nuclear Antibody (ANA): NEGATIVE

## 2019-03-06 LAB — ECHOCARDIOGRAM COMPLETE
Height: 60 in
Weight: 3467.39 oz

## 2019-03-06 MED ORDER — INSULIN ASPART 100 UNIT/ML ~~LOC~~ SOLN
6.0000 [IU] | Freq: Three times a day (TID) | SUBCUTANEOUS | Status: DC
  Administered 2019-03-06 – 2019-03-16 (×22): 6 [IU] via SUBCUTANEOUS

## 2019-03-06 MED ORDER — HEPARIN SODIUM (PORCINE) 5000 UNIT/ML IJ SOLN
5000.0000 [IU] | Freq: Three times a day (TID) | INTRAMUSCULAR | Status: DC
  Administered 2019-03-06 – 2019-03-16 (×29): 5000 [IU] via SUBCUTANEOUS
  Filled 2019-03-06 (×30): qty 1

## 2019-03-06 MED ORDER — INSULIN GLARGINE 100 UNIT/ML ~~LOC~~ SOLN
5.0000 [IU] | Freq: Once | SUBCUTANEOUS | Status: AC
  Administered 2019-03-06: 5 [IU] via SUBCUTANEOUS
  Filled 2019-03-06: qty 0.05

## 2019-03-06 MED ORDER — INSULIN ASPART 100 UNIT/ML ~~LOC~~ SOLN
30.0000 [IU] | Freq: Once | SUBCUTANEOUS | Status: AC
  Administered 2019-03-06: 30 [IU] via SUBCUTANEOUS

## 2019-03-06 MED ORDER — INSULIN ASPART 100 UNIT/ML ~~LOC~~ SOLN
35.0000 [IU] | Freq: Once | SUBCUTANEOUS | Status: AC
  Administered 2019-03-06: 35 [IU] via SUBCUTANEOUS

## 2019-03-06 MED ORDER — INSULIN GLARGINE 100 UNIT/ML ~~LOC~~ SOLN
15.0000 [IU] | Freq: Every day | SUBCUTANEOUS | Status: DC
  Administered 2019-03-06 – 2019-03-07 (×2): 15 [IU] via SUBCUTANEOUS
  Filled 2019-03-06 (×3): qty 0.15

## 2019-03-06 MED ORDER — INSULIN GLARGINE 100 UNIT/ML ~~LOC~~ SOLN
10.0000 [IU] | Freq: Once | SUBCUTANEOUS | Status: AC
  Administered 2019-03-06: 10 [IU] via SUBCUTANEOUS
  Filled 2019-03-06: qty 0.1

## 2019-03-06 MED ORDER — INSULIN GLARGINE 100 UNIT/ML ~~LOC~~ SOLN
30.0000 [IU] | Freq: Every day | SUBCUTANEOUS | Status: DC
  Filled 2019-03-06: qty 0.3

## 2019-03-06 MED ORDER — INSULIN GLARGINE 100 UNIT/ML ~~LOC~~ SOLN
15.0000 [IU] | Freq: Two times a day (BID) | SUBCUTANEOUS | Status: DC
  Filled 2019-03-06: qty 0.15

## 2019-03-06 NOTE — Progress Notes (Addendum)
NAME:  Tenishia Ekman, MRN:  [id number], DOB:  [birth year], LOS: 2 ADMISSION DATE:  03/03/2019, CONSULTATION DATE:  03/05/2019 REFERRING MD:  [facility] - [place], CHIEF COMPLAINT:  Acute hypoxemic respiratory failure   Brief History   68 year old female with PMH of morbid obesity, OSA and pulmonary HTN presenting to PCCM with acute hypoxemic respiratory failure.  Patient initially had pulmonary edema and was diuresed but inspite of that continued to have increase in O2 demand and PCCM was called on consultation.  Today patient report feeling better and has no additional complaints.  No history of auto-immune disorders, no symptoms of infection and no history of need for O2 in the past.  History of present illness   68 year old female with PMH of morbid obesity, OSA and pulmonary HTN presenting to PCCM with acute hypoxemic respiratory failure.  Patient initially had pulmonary edema and was diuresed but inspite of that continued to have increase in O2 demand and PCCM was called on consultation.  Today patient report feeling better and has no additional complaints.  No history of auto-immune disorders, no symptoms of infection and no history of need for O2 in the past.  Past Medical History  OSA OHV Pulmonary HTN Pulmonary edema  Significant Hospital Events   VQ scan negative  Consults:  PCCM  Procedures:  None  Significant Diagnostic Tests:  V/Q negative on 9/9  Micro Data:  N/A  Antimicrobials:  N/A   Interim history/subjective:  Feels better this AM No acute changes  Objective   Blood pressure (!) 139/54, pulse 88, temperature 97.6 F (36.4 C), temperature source Oral, resp. rate (!) 30, height 5' (1.524 m), weight 98.3 kg, last menstrual period 06/25/2008, SpO2 92 %.        Intake/Output Summary (Last 24 hours) at 03/06/2019 1048 Last data filed at 03/06/2019 0900 Gross per 24 hour  Intake 1140 ml  Output 2200 ml  Net -1060 ml   Filed Weights   03/04/19 0400 03/05/19 0354  03/06/19 0359  Weight: 102.9 kg 100.7 kg 98.3 kg   Examination: General: Chronically ill appearing, morbidly obese female, NAD HENT: /AT, PERRL, EOM-I and MMM Lungs: Coarse BS diffusely Cardiovascular: RRR, Nl S1/S2 and -M/R/G Abdomen: Obese, NT, ND and +BS Extremities: -edema and -tenderness Neuro: Intact Skin: Thin but intact  I reviewed chest CT myself, pulmonary edema and significant atelectasis  Resolved Hospital Problem list   N/A  Assessment & Plan:  68 year old with acute hypoxemic respiratory failure.  Discussed with [facility]-MD and PCCM-NP.  Hypoxemia:  - Titrate O2 for say of 88-92%  - Will need an ambulatory desat study prior to discharge for home O2  Acute pulmonary edema:  - Continue active diurese  - 2D echo to f/u via primary  Pulmonary HTN:  - Echo pending  - Auto-immune work up negative so far  - Procal negative, no need for steroids at this point  OSA:  - Continue home CPAP settings  - F/u as outpatient  Atelectasis:  - IS  - OOB to chair  - Ok to place foley while actively diuresing as that is preventing patient from sitting in the chair to combat atelectasis  - PT  - Ambulate  Labs   CBC: Recent Labs  Lab 03/03/19 1717 03/04/19 0416 03/05/19 0353 03/06/19 0440  WBC 9.6 9.3 9.9 11.1*  NEUTROABS 6.5  --   --   --   HGB 10.7* 10.3* 11.1* 11.3*  HCT 33.7* 32.7*  33.9* 33.6*  MCV 91.8 92.1 89.0 86.8  PLT 255 257 309 349    Basic Metabolic Panel: Recent Labs  Lab 03/03/19 1717 03/04/19 0416 03/05/19 0353 03/06/19 0440  NA 137 137 133* 135  K 4.7 4.8 4.6 3.9  CL 108 111 104 101  CO2 18* 16* 18* 21*  GLUCOSE 357* 329* 374* 266*  BUN 53* 48* 47* 51*  CREATININE 1.79* 1.40* 1.45* 1.54*  CALCIUM 9.4 9.3 9.4 9.4   GFR: Estimated Creatinine Clearance: 37.8 mL/min (A) (by C-G formula based on SCr of 1.54 mg/dL (H)). Recent Labs  Lab 03/03/19 1717 03/04/19 0416 03/05/19 0353 03/05/19 1813 03/06/19 0440  PROCALCITON  --   --   --   0.17  --   WBC 9.6 9.3 9.9  --  11.1*    Liver Function Tests: Recent Labs  Lab 03/03/19 1717  AST 20  ALT 38  ALKPHOS 56  BILITOT 0.3  PROT 6.2*  ALBUMIN 3.0*   No results for input(s): LIPASE, AMYLASE in the last 168 hours. No results for input(s): AMMONIA in the last 168 hours.  ABG    Component Value Date/Time   PHART 7.442 03/05/2019 1533   PCO2ART 30.9 (L) 03/05/2019 1533   PO2ART 73.0 (L) 03/05/2019 1533   HCO3 20.6 03/05/2019 1533   ACIDBASEDEF 2.8 (H) 03/05/2019 1533   O2SAT 94.0 03/05/2019 1533     Coagulation Profile: No results for input(s): INR, PROTIME in the last 168 hours.  Cardiac Enzymes: No results for input(s): CKTOTAL, CKMB, CKMBINDEX, TROPONINI in the last 168 hours.  HbA1C: Hemoglobin A1C  Date/Time Value Ref Range Status  02/06/2016 6.7  Final   Hgb A1c MFr Bld  Date/Time Value Ref Range Status  03/04/2019 05:00 AM 7.8 (H) 4.8 - 5.6 % Final    Comment:    (NOTE)         Prediabetes: 5.7 - 6.4         Diabetes: >6.4         Glycemic control for adults with diabetes: <7.0   02/06/2019 01:40 PM 8.1 (H) 4.6 - 6.5 % Final    Comment:    Glycemic Control Guidelines for People with Diabetes:Non Diabetic:  <6%Goal of Therapy: <7%Additional Action Suggested:  >8%     CBG: Recent Labs  Lab 03/05/19 1138 03/05/19 1141 03/05/19 1520 03/05/19 2119 03/06/19 0802  GLUCAP 493* 526* 447* 189* 287*   Discussed with [facility]-MD.  PCCM will sign off, please call back if needed.  Alyson ReedyWesam G. Yacoub, M.D. [facility]. Pager: [phone]. After hours pager: [phone].

## 2019-03-06 NOTE — Progress Notes (Signed)
  Echocardiogram 2D Echocardiogram has been performed.  Kayla Brown 03/06/2019, 3:12 PM

## 2019-03-06 NOTE — Progress Notes (Signed)
Pt sitting up in chair with puriwic on comfortably.  Pt doing IS per RN teaching.  Respirations 22, o2 93% on 4L [place], sob at rest.

## 2019-03-06 NOTE — Evaluation (Signed)
Physical Therapy Evaluation Patient Details Name: Kayla Brown MRN: [id number] DOB: 11/01/1950 Today's Date: 03/06/2019   History of Present Illness  68 y.o. female with medical history significant of hypertension, pulmonary hypertension, diabetes, morbid obesity, obstructive sleep apnea presenting 03/03/19 to the hospital for evaluation of shortness of breath and hypertension.  Patient states her blood pressure has been high for very long time with SBP 220 in ED. Chest x-ray showing cardiac enlargement and mild pulmonary vascular congestion. Findings thought to reflect early CHF. V/Q negative. COVID-19 negative. CBGs as high as 600.   Clinical Impression   Pt admitted with above diagnosis. Patient has had a functional decline resulting in mild balance impairments (likely related to requires slow velocity with walking to maintain cardiopulmonary status--and still requires 4L of O2). Patient is highly motivated to return to her normal exercise routine (treadmill and swimming) which has been interrupted by the pandemic. Pt currently with functional limitations due to the deficits listed below (see PT Problem List). Pt will benefit from skilled PT to increase their independence and safety with mobility to allow discharge to the venue listed below.       Follow Up Recommendations Home health PT;Supervision - Intermittent    Equipment Recommendations  None recommended by PT    Recommendations for Other Services OT consult     Precautions / Restrictions Precautions Precautions: Other (comment) Precaution Comments: desaturates      Mobility  Bed Mobility Overal bed mobility: Modified Independent             General bed mobility comments: out and into bed  Transfers Overall transfer level: Needs assistance Equipment used: None Transfers: Sit to/from Stand Sit to Stand: Supervision         General transfer comment: for safety  Ambulation/Gait Ambulation/Gait assistance: Min  guard Gait Distance (Feet): 160 Feet Assistive device: None Gait Pattern/deviations: Step-through pattern;Decreased stride length;Wide base of support Gait velocity: slow   General Gait Details: walking slower than usual to maintain sats and prevent SOB; slightly unsteady and cued to widen her steps for incr BOS  Stairs            Wheelchair Mobility    Modified Rankin (Stroke Patients Only)       Balance Overall balance assessment: Mild deficits observed, not formally tested                                           Pertinent Vitals/Pain Pain Assessment: No/denies pain    Home Living Family/patient expects to be discharged to:: Private residence Living Arrangements: Spouse/significant other Available Help at Discharge: Family;Available PRN/intermittently(spouse works during day) Type of Home: Mobile home Home Access: Level entry     Home Layout: One level Home Equipment: Grab bars - tub/shower;Walker - 4 wheels;Grab bars - toilet      Prior Function Level of Independence: Independent         Comments: does not drive; has been walking on a treadmill in the gym at apartments and has not been able to increase her stamina (max 12-14 minutes at 2.0 mph)     Hand Dominance        Extremity/Trunk Assessment   Upper Extremity Assessment Upper Extremity Assessment: Generalized weakness    Lower Extremity Assessment Lower Extremity Assessment: Generalized weakness    Cervical / Trunk Assessment Cervical / Trunk Assessment: Other exceptions Cervical / Trunk  Exceptions: obese  Communication   Communication: No difficulties  Cognition Arousal/Alertness: Awake/alert Behavior During Therapy: WFL for tasks assessed/performed Overall Cognitive Status: Within Functional Limits for tasks assessed                                        General Comments General comments (skin integrity, edema, etc.): on 4L with sats >92%  throughout;     Exercises Other Exercises Other Exercises: educated in use of IS and pt able to pull 1000-1250 ml x 5 breaths   Assessment/Plan    PT Assessment Patient needs continued PT services  PT Problem List Decreased strength;Decreased activity tolerance;Decreased balance;Decreased mobility;Decreased knowledge of use of DME;Cardiopulmonary status limiting activity;Obesity       PT Treatment Interventions DME instruction;Gait training;Functional mobility training;Therapeutic activities;Therapeutic exercise;Balance training;Patient/family education    PT Goals (Current goals can be found in the Care Plan section)  Acute Rehab PT Goals Patient Stated Goal: go home and not need oxygen or walker PT Goal Formulation: With patient Time For Goal Achievement: 03/20/19 Potential to Achieve Goals: Good    Frequency Min 3X/week   Barriers to discharge Decreased caregiver support husband works days (for [employer])    Co-evaluation               AM-PAC PT "6 Clicks" Mobility  Outcome Measure Help needed turning from your back to your side while in a flat bed without using bedrails?: None Help needed moving from lying on your back to sitting on the side of a flat bed without using bedrails?: None Help needed moving to and from a bed to a chair (including a wheelchair)?: A Little Help needed standing up from a chair using your arms (e.g., wheelchair or bedside chair)?: A Little Help needed to walk in hospital room?: A Little Help needed climbing 3-5 steps with a railing? : A Little 6 Click Score: 20    End of Session Equipment Utilized During Treatment: Gait belt;Oxygen Activity Tolerance: Patient tolerated treatment well Patient left: in bed;with call bell/phone within reach;with bed alarm set   PT Visit Diagnosis: Muscle weakness (generalized) (M62.81);Difficulty in walking, not elsewhere classified (R26.2)    Time: 1478-2956 PT Time Calculation (min) (ACUTE ONLY): 41  min   Charges:   PT Evaluation $PT Eval Moderate Complexity: 1 Mod PT Treatments $Gait Training: 8-22 mins $Self Care/Home Management: 8-22          Barry Brunner, PT      Rexanne Mano 03/06/2019, 4:33 PM

## 2019-03-06 NOTE — Progress Notes (Signed)
MD notified of pt's blood sugar 571.  Orders received for 30 units Novolog.  Will recheck.

## 2019-03-06 NOTE — Progress Notes (Signed)
Inpatient Diabetes Program Recommendations  AACE/ADA: New Consensus Statement on Inpatient Glycemic Control   Target Ranges:  Prepandial:   less than 140 mg/dL      Peak postprandial:   less than 180 mg/dL (1-2 hours)      Critically ill patients:  140 - 180 mg/dL   Results for SHIRLY, BARTOSIEWICZ (MRN [id number]) as of 03/06/2019 09:54  Ref. Range 03/05/2019 07:50 03/05/2019 11:38 03/05/2019 11:41 03/05/2019 15:20 03/05/2019 21:19 03/06/2019 08:02  Glucose-Capillary Latest Ref Range: 70 - 99 mg/dL 368 (H)  Novolog 7 units  Lantus 5 units 493 (H)   526 (HH)  Novolog 35 units 447 (H)  Novolog 35 units  Lantus 10 units 189 (H)     Lantus 10 units 287 (H)  Novolog 8 units  Lantus 10 units   Review of Glycemic Control  Diabetes history: DM2 Outpatient Diabetes medications: Jardiance 10 mg QAM, Glipizide XL 10 mg QAM, Metformin XR 1000 mg BID, Actos 30 mg daily Current orders for Inpatient glycemic control: Lantus 10 units BID, Novolog 0-15 units TID with meals, Novolog 0-5 units QHS  Inpatient Diabetes Program Recommendations:   Insulin-Basal: Patient received a total of Lantus 25 units on 03/05/19 and fasting glucose 287 mg/dl today. Please consider increasing Lantus to 15 units BID.  Insulin-Meal Coverage: Please consider ordering Novolog 6 units TID with meals for meal coverage if patient eats at least 50% of meals.  Thanks, Barnie Alderman, RN, MSN, CDE Diabetes Coordinator Inpatient Diabetes Program [phone] (Team Pager from 8am to 5pm)

## 2019-03-06 NOTE — Progress Notes (Signed)
PROGRESS NOTE                                                                                                                                                                                                             Patient Demographics:    Kayla Brown, is a 68 y.o. female, DOB - 09/10/1950, WUJ:[id number]  Admit date - 03/03/2019   Admitting Physician John Giovanni, MD  Outpatient Primary MD for the patient is Sheliah Hatch, MD  LOS - 2   Chief Complaint  Patient presents with   Shortness of Breath   Hypertension       Brief Narrative    68 y.o. female with medical history significant of hypertension, pulmonary hypertension, diabetes, morbid obesity, obstructive sleep apnea presenting to the hospital for evaluation of shortness of breath and hypertension, work-up significant for hypoxia, volume overload, she was admitted for IV diuresis.  Patient with increased oxygen requirement 9/10, at 8 L nasal cannula, PCCM consulted, work-up significant for atelectasis and pulmonary edema.    Subjective:    Kayla Brown today reports she had a better night sleep, still reports some dyspnea, remains on 5 L nasal cannula,   Assessment  & Plan :    Principal Problem:   Acute respiratory failure with hypoxia (HCC) Active Problems:   Essential hypertension, benign   OSA (obstructive sleep apnea)   Pulmonary edema   AKI (acute kidney injury) (HCC)  Acute hypoxic respiratory failure secondary to pulmonary edema -Secondary to volume overload, and atelectasis, patient with increased oxygen requirement to 8 L nasal cannula yesterday. -Pulmonary input greatly appreciated, this is most likely in the setting of acute pulmonary edema, and poor inspiratory effort/atelectasis -Continue with IV diuresis, she is -3.4 L since admission, repeat 2D echo. -CPAP at bed night to improve inspiratory effort.  We will get out of bed to chair, she was  encouraged to use incentive spirometry -VQ scan with no evidence of PE, heparin GTT has been stopped .  AKI on CKD 3 -Baseline creatinine 1.2, was 1.7 on presentation, improving with IV diuresis .  Uncontrolled hypertension -Blood pressure better improved, continue with amlodipine, hydralazine. -Hold home ACE inhibitor and diuretics given AKI  OSA -CPAP at night  Diabetes mellitus -Trolled, have increase Lantus to 15 units twice daily, continue with moderate sliding scale and pre-meal  NovoLog . -Follow on A1c .    COVID-19 Labs  Recent Labs    03/04/19 0110 03/05/19 1541  DDIMER 1.69* 1.65*  FERRITIN  --  62  LDH  --  248*  CRP  --  1.7*    Lab Results  Component Value Date   SARSCOV2NAA NEGATIVE 03/05/2019   SARSCOV2NAA NEGATIVE 03/04/2019     Code Status : Full  Family Communication  : D/W patient  Disposition Plan  : Home  Barriers For Discharge : Remains on oxygen, hypervolemic, on IV diuresis  Consults  :  none  Procedures  : None  DVT Prophylaxis  :  [place] Heparin   Lab Results  Component Value Date   PLT 349 03/06/2019    Antibiotics  :    Anti-infectives (From admission, onward)   None        Objective:   Vitals:   03/06/19 0730 03/06/19 0800 03/06/19 0900 03/06/19 1100  BP:  (!) 135/49 (!) 139/54 (!) 148/55  Pulse:  79 88 91  Resp:  (!) 29 (!) 30 (!) 21  Temp:  97.6 F (36.4 C)    TempSrc:  Oral    SpO2: 93% 91% 92% 92%  Weight:      Height:        Wt Readings from Last 3 Encounters:  03/06/19 98.3 kg  02/16/19 102.9 kg  02/05/19 100.7 kg     Intake/Output Summary (Last 24 hours) at 03/06/2019 1136 Last data filed at 03/06/2019 0900 Gross per 24 hour  Intake 1140 ml  Output 2200 ml  Net -1060 ml     Physical Exam  Awake Alert, Oriented X 3, No new F.N deficits, Normal affect Symmetrical Chest wall movement, diminished air entry at the bases, coarse RRR,No Gallops,Rubs or new Murmurs, No Parasternal Heave +ve  B.Sounds, Abd Soft, No tenderness, No rebound - guarding or rigidity. No Cyanosis, Clubbing or edema, No new Rash or bruise        Data Review:    CBC Recent Labs  Lab 03/03/19 1717 03/04/19 0416 03/05/19 0353 03/06/19 0440  WBC 9.6 9.3 9.9 11.1*  HGB 10.7* 10.3* 11.1* 11.3*  HCT 33.7* 32.7* 33.9* 33.6*  PLT 255 257 309 349  MCV 91.8 92.1 89.0 86.8  MCH 29.2 29.0 29.1 29.2  MCHC 31.8 31.5 32.7 33.6  RDW 14.7 14.6 14.4 14.3  LYMPHSABS 1.2  --   --   --   MONOABS 1.1*  --   --   --   EOSABS 0.7*  --   --   --   BASOSABS 0.0  --   --   --     Chemistries  Recent Labs  Lab 03/03/19 1717 03/04/19 0416 03/05/19 0353 03/06/19 0440  NA 137 137 133* 135  K 4.7 4.8 4.6 3.9  CL 108 111 104 101  CO2 18* 16* 18* 21*  GLUCOSE 357* 329* 374* 266*  BUN 53* 48* 47* 51*  CREATININE 1.79* 1.40* 1.45* 1.54*  CALCIUM 9.4 9.3 9.4 9.4  AST 20  --   --   --   ALT 38  --   --   --   ALKPHOS 56  --   --   --   BILITOT 0.3  --   --   --    ------------------------------------------------------------------------------------------------------------------ No results for input(s): CHOL, HDL, LDLCALC, TRIG, CHOLHDL, LDLDIRECT in the last 72 hours.  Lab Results  Component Value Date   HGBA1C 7.8 (H)  03/04/2019   ------------------------------------------------------------------------------------------------------------------ No results for input(s): TSH, T4TOTAL, T3FREE, THYROIDAB in the last 72 hours.  Invalid input(s): FREET3 ------------------------------------------------------------------------------------------------------------------ Recent Labs    03/05/19 1541  FERRITIN 62    Coagulation profile No results for input(s): INR, PROTIME in the last 168 hours.  Recent Labs    03/04/19 0110 03/05/19 1541  DDIMER 1.69* 1.65*    Cardiac Enzymes No results for input(s): CKMB, TROPONINI, MYOGLOBIN in the last 168 hours.  Invalid input(s):  CK ------------------------------------------------------------------------------------------------------------------    Component Value Date/Time   BNP 50.8 03/03/2019 2357   BNP 25.5 04/08/2015 0753    Inpatient Medications  Scheduled Meds:  amLODipine  10 mg Oral Daily   atorvastatin  40 mg Oral q1800   chlorthalidone  25 mg Oral Daily   cholecalciferol  2,000 Units Oral Daily   fluticasone  2 spray Each Nare Daily   furosemide  40 mg Intravenous BID   hydrALAZINE  25 mg Oral Q6H   insulin aspart  0-15 Units Subcutaneous TID WC   insulin aspart  0-5 Units Subcutaneous QHS   insulin glargine  15 Units Subcutaneous BID   insulin glargine  5 Units Subcutaneous Once   loratadine  10 mg Oral Daily   Melatonin  3 mg Oral QHS   multivitamin  1 tablet Oral Daily   omega-3 acid ethyl esters  1 g Oral Daily   sertraline  50 mg Oral Daily   umeclidinium-vilanterol  1 puff Inhalation Daily   Continuous Infusions:  PRN Meds:.acetaminophen **OR** acetaminophen, hydrALAZINE  Micro Results Recent Results (from the past 240 hour(s))  SARS Coronavirus 2 [facility] order, Performed in [facility] hospital lab) Nasopharyngeal Nasopharyngeal Swab     Status: None   Collection Time: 03/04/19 12:01 AM   Specimen: Nasopharyngeal Swab  Result Value Ref Range Status   SARS Coronavirus 2 NEGATIVE NEGATIVE Final    Comment: (NOTE) If result is NEGATIVE SARS-CoV-2 target nucleic acids are NOT DETECTED. The SARS-CoV-2 RNA is generally detectable in upper and lower  respiratory specimens during the acute phase of infection. The lowest  concentration of SARS-CoV-2 viral copies this assay can detect is 250  copies / mL. A negative result does not preclude SARS-CoV-2 infection  and should not be used as the sole basis for treatment or other  patient management decisions.  A negative result may occur with  improper specimen collection / handling, submission of specimen other  than  nasopharyngeal swab, presence of viral mutation(s) within the  areas targeted by this assay, and inadequate number of viral copies  (<250 copies / mL). A negative result must be combined with clinical  observations, patient history, and epidemiological information. If result is POSITIVE SARS-CoV-2 target nucleic acids are DETECTED. The SARS-CoV-2 RNA is generally detectable in upper and lower  respiratory specimens dur ing the acute phase of infection.  Positive  results are indicative of active infection with SARS-CoV-2.  Clinical  correlation with patient history and other diagnostic information is  necessary to determine patient infection status.  Positive results do  not rule out bacterial infection or co-infection with other viruses. If result is PRESUMPTIVE POSTIVE SARS-CoV-2 nucleic acids MAY BE PRESENT.   A presumptive positive result was obtained on the submitted specimen  and confirmed on repeat testing.  While 2019 novel coronavirus  (SARS-CoV-2) nucleic acids may be present in the submitted sample  additional confirmatory testing may be necessary for epidemiological  and / or clinical management purposes  to differentiate  between  SARS-CoV-2 and other Sarbecovirus currently known to infect humans.  If clinically indicated additional testing with an alternate test  methodology [id number]) is advised. The SARS-CoV-2 RNA is generally  detectable in upper and lower respiratory sp ecimens during the acute  phase of infection. The expected result is Negative. Fact Sheet for Patients:  BoilerBrush.com.cyhttps://www.fda.gov/media/136312/download Fact Sheet for Healthcare Providers: https://pope.com/https://www.fda.gov/media/136313/download This test is not yet approved or cleared by the [place] FDA and has been authorized for detection and/or diagnosis of SARS-CoV-2 by FDA under an Emergency Use Authorization (EUA).  This EUA will remain in effect (meaning this test can be used) for the duration of  the COVID-19 declaration under Section 564(b)(1) of the Act, 21 U.S.C. section 360bbb-3(b)(1), unless the authorization is terminated or revoked sooner. Performed at [facility], [address] [place], [place] [address]   Urine Culture     Status: Abnormal   Collection Time: 03/04/19  8:47 AM   Specimen: Urine, Random  Result Value Ref Range Status   Specimen Description URINE, RANDOM  Final   Special Requests NONE  Final   Culture (A)  Final    <10,000 COLONIES/mL INSIGNIFICANT GROWTH Performed at [facility], [address] [place], [place] [address]    Report Status 03/05/2019 FINAL  Final  SARS Coronavirus 2 [facility](Hospital order, Performed in [facility] hospital lab) Nasopharyngeal Nasopharyngeal Swab     Status: None   Collection Time: 03/05/19  3:30 PM   Specimen: Nasopharyngeal Swab  Result Value Ref Range Status   SARS Coronavirus 2 NEGATIVE NEGATIVE Final    Comment: (NOTE) If result is NEGATIVE SARS-CoV-2 target nucleic acids are NOT DETECTED. The SARS-CoV-2 RNA is generally detectable in upper and lower  respiratory specimens during the acute phase of infection. The lowest  concentration of SARS-CoV-2 viral copies this assay can detect is 250  copies / mL. A negative result does not preclude SARS-CoV-2 infection  and should not be used as the sole basis for treatment or other  patient management decisions.  A negative result may occur with  improper specimen collection / handling, submission of specimen other  than nasopharyngeal swab, presence of viral mutation(s) within the  areas targeted by this assay, and inadequate number of viral copies  (<250 copies / mL). A negative result must be combined with clinical  observations, patient history, and epidemiological information. If result is POSITIVE SARS-CoV-2 target nucleic acids are DETECTED. The SARS-CoV-2 RNA is generally detectable in upper and lower  respiratory specimens dur ing the acute phase of  infection.  Positive  results are indicative of active infection with SARS-CoV-2.  Clinical  correlation with patient history and other diagnostic information is  necessary to determine patient infection status.  Positive results do  not rule out bacterial infection or co-infection with other viruses. If result is PRESUMPTIVE POSTIVE SARS-CoV-2 nucleic acids MAY BE PRESENT.   A presumptive positive result was obtained on the submitted specimen  and confirmed on repeat testing.  While 2019 novel coronavirus  (SARS-CoV-2) nucleic acids may be present in the submitted sample  additional confirmatory testing may be necessary for epidemiological  and / or clinical management purposes  to differentiate between  SARS-CoV-2 and other Sarbecovirus currently known to infect humans.  If clinically indicated additional testing with an alternate test  methodology [id number]) is advised. The SARS-CoV-2 RNA is generally  detectable in upper and lower respiratory sp ecimens during the acute  phase of infection. The  expected result is Negative. Fact Sheet for Patients:  BoilerBrush.com.cy Fact Sheet for Healthcare Providers: https://pope.com/ This test is not yet approved or cleared by the [place] FDA and has been authorized for detection and/or diagnosis of SARS-CoV-2 by FDA under an Emergency Use Authorization (EUA).  This EUA will remain in effect (meaning this test can be used) for the duration of the COVID-19 declaration under Section 564(b)(1) of the Act, 21 U.S.C. section 360bbb-3(b)(1), unless the authorization is terminated or revoked sooner. Performed at [facility], [address], [place], [place] [address]   Respiratory Panel by PCR     Status: None   Collection Time: 03/05/19  6:25 PM   Specimen: Nasopharyngeal Swab; Respiratory  Result Value Ref Range Status   Adenovirus NOT DETECTED NOT DETECTED Final   Coronavirus 229E NOT  DETECTED NOT DETECTED Final    Comment: (NOTE) The Coronavirus on the Respiratory Panel, DOES NOT test for the novel  Coronavirus (2019 nCoV)    Coronavirus HKU1 NOT DETECTED NOT DETECTED Final   Coronavirus NL63 NOT DETECTED NOT DETECTED Final   Coronavirus OC43 NOT DETECTED NOT DETECTED Final   Metapneumovirus NOT DETECTED NOT DETECTED Final   Rhinovirus / Enterovirus NOT DETECTED NOT DETECTED Final   Influenza A NOT DETECTED NOT DETECTED Final   Influenza B NOT DETECTED NOT DETECTED Final   Parainfluenza Virus 1 NOT DETECTED NOT DETECTED Final   Parainfluenza Virus 2 NOT DETECTED NOT DETECTED Final   Parainfluenza Virus 3 NOT DETECTED NOT DETECTED Final   Parainfluenza Virus 4 NOT DETECTED NOT DETECTED Final   Respiratory Syncytial Virus NOT DETECTED NOT DETECTED Final   Bordetella pertussis NOT DETECTED NOT DETECTED Final   Chlamydophila pneumoniae NOT DETECTED NOT DETECTED Final   Mycoplasma pneumoniae NOT DETECTED NOT DETECTED Final    Comment: Performed at [facility], [address], [place], [place] [address]    Radiology Reports Dg Chest 2 View  Result Date: 03/03/2019 CLINICAL DATA:  Pneumonia.  Short of breath. EXAM: CHEST - 2 VIEW COMPARISON:  02/17/2019 FINDINGS: Mild cardiac enlargement. Pulmonary vascular congestion identified. Lungs are hyperinflated and there are coarsened interstitial markings noted bilaterally. Pleural thickening along the major fissure is again noted. Scar or atelectasis noted in the right middle lobe. IMPRESSION: Cardiac enlargement and mild pulmonary vascular congestion with thickening along the major fissure of the right lung. Findings may reflect early CHF. No airspace consolidation identified. Electronically Signed   By: Signa Kell M.D.   On: 03/03/2019 17:55   Dg Chest 2 View  Result Date: 02/18/2019 CLINICAL DATA:  Dyspnea on exertion.  Pulmonary hypertension. EXAM: CHEST - 2 VIEW COMPARISON:  None. FINDINGS: Normal heart size.  Normal mediastinal contour. No pneumothorax. No pleural effusion. Patchy right middle lobe opacity. No pulmonary edema. IMPRESSION: Patchy right middle lobe opacity suggests a pneumonia. Recommend follow-up PA and lateral post treatment chest radiographs in 4-6 weeks. Electronically Signed   By: Delbert Phenix M.D.   On: 02/18/2019 08:47   Ct Chest Wo Contrast  Result Date: 03/05/2019 CLINICAL DATA:  Acute hypoxemic respiratory failure EXAM: CT CHEST WITHOUT CONTRAST TECHNIQUE: Multidetector CT imaging of the chest was performed following the standard protocol without IV contrast. COMPARISON:  CT 04/05/2015 FINDINGS: Cardiovascular: Normal heart size. No pericardial effusion. Atherosclerotic calcification of the aortic leaflets is noted. There is mitral annular calcification is well. The aorta is normal caliber with atheromatous plaque. Additional calcifications are present in the proximal great vessels and  upper abdominal aorta. Major venous structures are unremarkable. Mediastinum/Nodes: Numerous though nonenlarged mediastinal nodes are present. Evaluation for hilar adenopathy is limited in the absence of contrast. No axillary adenopathy. Thyroid gland and thoracic inlet are unremarkable. Bowing of the posterior trachea 0 likely related to imaging during exhalation. There is mild airways thickening. The esophagus is unremarkable. Lungs/Pleura: Redemonstration of mosaic attenuation throughout the lungs superimposed on centrilobular and paraseptal emphysema. There is increasing bandlike areas of subsegmental atelectasis and/or scarring as well as additional atelectatic change posteriorly in the lung bases. No pneumothorax. No effusion. Stable subpleural area of scarring is seen in the right middle lobe. Upper Abdomen: Small accessory splenule. Mild bilateral symmetric perinephric stranding, a nonspecific finding though may correlate with either age or decreased renal function. Portion of the hepatic flexure is  interposed anterior to the liver. Fatty replacement of the pancreas. No pancreatic ductal dilatation or surrounding inflammatory changes. No acute abnormalities present in the visualized portions of the upper abdomen. Musculoskeletal: Multilevel degenerative changes are present in the imaged portions of the spine. No acute osseous abnormality or suspicious osseous lesion. IMPRESSION: 1. Extensive atelectatic changes are present throughout the lungs with a dependent predominance. 2. Redemonstration of mosaic attenuation throughout the lungs superimposed on centrilobular and paraseptal emphysema, suggestive of small airways disease. 3. Mild bilateral symmetric perinephric stranding, a nonspecific finding though may correlate with either age or decreased renal function. 4. Aortic Atherosclerosis (ICD10-I70.0) and Emphysema (ICD10-J43.9). Electronically Signed   By: Lovena Le M.D.   On: 03/05/2019 22:41   Nm Pulmonary Perf And Vent  Result Date: 03/04/2019 CLINICAL DATA:  Evaluate for pulmonary embolism. Short of breath with hypertension. EXAM: NUCLEAR MEDICINE VENTILATION - PERFUSION LUNG SCAN TECHNIQUE: Ventilation images were obtained in multiple projections using inhaled aerosol Tc-73m DTPA. Perfusion images were obtained in multiple projections after intravenous injection of Tc-100m MAA. RADIOPHARMACEUTICALS:  32.7 mCi of Tc-70m DTPA aerosol inhalation and 1.53 mCi Tc50m MAA IV COMPARISON:  Chest radiograph 03/03/2019 FINDINGS: Ventilation: Large segmental ventilation defect identified involving the superior segment of left lower lobe. Medium to large segmental perfusion defect identified within the lateral left lower lobe. Perfusion: Corresponding perfusion abnormalities are noted within the superior segment of left lower lobe and lateral left lower lobe. IMPRESSION: No unmatched segmental perfusion defects identified to suggest acute pulmonary embolus. Electronically Signed   By: Kerby Moors M.D.   On:  03/04/2019 15:54   Dg Chest Port 1 View  Result Date: 03/05/2019 CLINICAL DATA:  Hypoxia EXAM: PORTABLE CHEST 1 VIEW COMPARISON:  03/03/2019 FINDINGS: Cardiomegaly. Mild irregular scarring or atelectasis of the mid lungs bilaterally. No acute appearing airspace opacity. IMPRESSION: Cardiomegaly. Mild irregular scarring or atelectasis of the mid lungs bilaterally. No acute appearing airspace opacity. Electronically Signed   By: Eddie Candle M.D.   On: 03/05/2019 15:07     Phillips Climes M.D on 03/06/2019 at 11:36 AM  Between 7am to 7pm - Pager - [phone]  After 7pm go to [email or web address] - password [facility]  [facility] -  Office  [phone]

## 2019-03-07 LAB — CBC
HCT: 37.3 % (ref 36.0–46.0)
Hemoglobin: 12.4 g/dL (ref 12.0–15.0)
MCH: 29.2 pg (ref 26.0–34.0)
MCHC: 33.2 g/dL (ref 30.0–36.0)
MCV: 87.8 fL (ref 80.0–100.0)
Platelets: 403 10*3/uL — ABNORMAL HIGH (ref 150–400)
RBC: 4.25 MIL/uL (ref 3.87–5.11)
RDW: 14 % (ref 11.5–15.5)
WBC: 12.3 10*3/uL — ABNORMAL HIGH (ref 4.0–10.5)
nRBC: 0 % (ref 0.0–0.2)

## 2019-03-07 LAB — GLUCOSE, CAPILLARY
Glucose-Capillary: 322 mg/dL — ABNORMAL HIGH (ref 70–99)
Glucose-Capillary: 385 mg/dL — ABNORMAL HIGH (ref 70–99)
Glucose-Capillary: 303 mg/dL — ABNORMAL HIGH (ref 70–99)
Glucose-Capillary: 358 mg/dL — ABNORMAL HIGH (ref 70–99)

## 2019-03-07 LAB — BASIC METABOLIC PANEL
Anion gap: 12 (ref 5–15)
BUN: 65 mg/dL — ABNORMAL HIGH (ref 8–23)
CO2: 20 mmol/L — ABNORMAL LOW (ref 22–32)
Calcium: 9.6 mg/dL (ref 8.9–10.3)
Chloride: 102 mmol/L (ref 98–111)
Creatinine, Ser: 1.47 mg/dL — ABNORMAL HIGH (ref 0.44–1.00)
GFR calc Af Amer: 43 mL/min — ABNORMAL LOW (ref >60)
GFR calc non Af Amer: 37 mL/min — ABNORMAL LOW (ref >60)
Glucose, Bld: 273 mg/dL — ABNORMAL HIGH (ref 70–99)
Potassium: 4.2 mmol/L (ref 3.5–5.1)
Sodium: 134 mmol/L — ABNORMAL LOW (ref 135–145)

## 2019-03-07 LAB — MPO/PR-3 (ANCA) ANTIBODIES
ANCA Proteinase 3: <3.5 U/mL (ref 0.0–3.5)
Myeloperoxidase Abs: <9 U/mL (ref 0.0–9.0)

## 2019-03-07 LAB — HEMOGLOBIN A1C
Hgb A1c MFr Bld: 8 % — ABNORMAL HIGH (ref 4.8–5.6)
Mean Plasma Glucose: 183 mg/dL

## 2019-03-07 MED ORDER — INSULIN GLARGINE 100 UNIT/ML ~~LOC~~ SOLN
10.0000 [IU] | Freq: Once | SUBCUTANEOUS | Status: AC
  Administered 2019-03-07: 10 [IU] via SUBCUTANEOUS
  Filled 2019-03-07: qty 0.1

## 2019-03-07 MED ORDER — FUROSEMIDE 10 MG/ML IJ SOLN
60.0000 mg | Freq: Two times a day (BID) | INTRAMUSCULAR | Status: DC
  Administered 2019-03-07 – 2019-03-08 (×3): 60 mg via INTRAVENOUS
  Filled 2019-03-07 (×3): qty 6

## 2019-03-07 MED ORDER — SIMETHICONE 80 MG PO CHEW
80.0000 mg | CHEWABLE_TABLET | Freq: Four times a day (QID) | ORAL | Status: DC | PRN
  Administered 2019-03-08: 80 mg via ORAL
  Filled 2019-03-07: qty 1

## 2019-03-07 MED ORDER — INSULIN GLARGINE 100 UNIT/ML ~~LOC~~ SOLN
35.0000 [IU] | Freq: Every day | SUBCUTANEOUS | Status: DC
  Administered 2019-03-07: 35 [IU] via SUBCUTANEOUS
  Filled 2019-03-07: qty 0.35

## 2019-03-07 MED ORDER — INSULIN GLARGINE 100 UNIT/ML ~~LOC~~ SOLN
40.0000 [IU] | Freq: Every day | SUBCUTANEOUS | Status: DC
  Administered 2019-03-08: 40 [IU] via SUBCUTANEOUS
  Filled 2019-03-07: qty 0.4

## 2019-03-07 NOTE — Evaluation (Addendum)
Occupational Therapy Evaluation Patient Details Name: Kayla Brown MRN: [id number] DOB: 10/30/1950 Today's Date: 03/07/2019    History of Present Illness 68 y.o. female with medical history significant of hypertension, pulmonary hypertension, diabetes, morbid obesity, obstructive sleep apnea presenting 03/03/19 to the hospital for evaluation of shortness of breath and hypertension.  Patient states her blood pressure has been high for very long time with SBP 220 in ED. Chest x-ray showing cardiac enlargement and mild pulmonary vascular congestion. Findings thought to reflect early CHF. V/Q negative. COVID-19 negative. CBGs as high as 600.    Clinical Impression   This 68 y/o female presents with the above. PTA pt reports independence with ADL and functional mobility. Pt with limitations including generalized weakness, impaired balance, decreased endurance. Pt currently requires minA for functional transfers; pt unsteady today without use of AD and noted seeking UE support therefore utilized RW for additional short distance mobility with improvements noted. She currently requires minA for standing grooming and LB ADL, setup/minguard assist for seated UB ADL. Pt on 3L O2 with activity with O2 sats maintaining >90%, but with DOE 3/4 with prolonged standing and pt requiring cues to initiate seated rest. Education provided to both pt/pt's spouse on additional energy conservation techniques during functional tasks. She will benefit from continued acute OT services and recommend follow up HHOT services to maximize her safety and independence with ADL and mobility. Will follow.     Follow Up Recommendations  Home health OT;Supervision/Assistance - 24 hour    Equipment Recommendations  None recommended by OT           Precautions / Restrictions Precautions Precautions: Other (comment) Precaution Comments: desaturates Restrictions Weight Bearing Restrictions: No      Mobility Bed Mobility                General bed mobility comments: received OOB in recliner  Transfers Overall transfer level: Needs assistance Equipment used: None;Rolling walker (2 wheeled) Transfers: Sit to/from Stand Sit to Stand: Min guard         General transfer comment: for safety/lines    Balance Overall balance assessment: Needs assistance Sitting-balance support: Feet supported Sitting balance-Leahy Scale: Good     Standing balance support: Bilateral upper extremity supported;Single extremity supported;During functional activity Standing balance-Leahy Scale: Poor Standing balance comment: pt seeking UE support today (at least single UE)                           ADL either performed or assessed with clinical judgement   ADL Overall ADL's : Needs assistance/impaired Eating/Feeding: Independent;Sitting   Grooming: Oral care;Wash/dry face;Brushing hair;Minimal assistance;Standing Grooming Details (indicate cue type and reason): minA standing balance Upper Body Bathing: Min guard;Set up;Sitting   Lower Body Bathing: Minimal assistance;Sit to/from stand   Upper Body Dressing : Set up;Sitting   Lower Body Dressing: Minimal assistance;Sit to/from stand   Toilet Transfer: Minimal assistance;Ambulation;RW Toilet Transfer Details (indicate cue type and reason): ambulation with/without RW, simulated via transfer to/from recliner. pt unsteady without RW Toileting- Clothing Manipulation and Hygiene: Minimal assistance;Sit to/from stand       Functional mobility during ADLs: Minimal assistance;Rolling walker General ADL Comments: pt unsteady without RW; requires cues to initiate rest breaks as noted pt becoming dyspneic with standing activity (performs x3 grooming tasks in standing); educated both pt/pt's spouse re: energy conservation techniques during ADL/iADL tasks     Vision         Perception  Praxis      Pertinent Vitals/Pain Pain Assessment: No/denies pain      Hand Dominance     Extremity/Trunk Assessment Upper Extremity Assessment Upper Extremity Assessment: Generalized weakness   Lower Extremity Assessment Lower Extremity Assessment: Defer to PT evaluation   Cervical / Trunk Assessment Cervical / Trunk Assessment: Other exceptions Cervical / Trunk Exceptions: obese   Communication Communication Communication: No difficulties   Cognition Arousal/Alertness: Awake/alert Behavior During Therapy: WFL for tasks assessed/performed Overall Cognitive Status: Within Functional Limits for tasks assessed                                     General Comments  pt on 3L O2 with O2 sats maintaining >90% with standing activity    Exercises     Shoulder Instructions      Home Living Family/patient expects to be discharged to:: Private residence Living Arrangements: Spouse/significant other Available Help at Discharge: Family;Available PRN/intermittently(spouse works during day) Type of Home: Mobile home Home Access: Level entry     Home Layout: One level     Bathroom Shower/Tub: Corporate investment banker: Handicapped height Bathroom Accessibility: Yes   Home Equipment: Grab bars - tub/shower;Walker - 4 wheels;Grab bars - toilet;Shower seat          Prior Functioning/Environment Level of Independence: Independent        Comments: does not drive; has been walking on a treadmill in the gym at apartments and has not been able to increase her stamina (max 12-14 minutes at 2.0 mph)        OT Problem List: Decreased strength;Decreased range of motion;Decreased activity tolerance;Impaired balance (sitting and/or standing);Decreased knowledge of use of DME or AE;Obesity;Cardiopulmonary status limiting activity      OT Treatment/Interventions: Self-care/ADL training;Therapeutic exercise;Neuromuscular education;Energy conservation;DME and/or AE instruction;Therapeutic activities;Patient/family  education;Balance training    OT Goals(Current goals can be found in the care plan section) Acute Rehab OT Goals Patient Stated Goal: go home and not need oxygen or walker; be able to continue to perform ADL/iADL OT Goal Formulation: With patient Time For Goal Achievement: 03/21/19 Potential to Achieve Goals: Good  OT Frequency: Min 2X/week   Barriers to D/C:            Co-evaluation              AM-PAC OT "6 Clicks" Daily Activity     Outcome Measure Help from another person eating meals?: None Help from another person taking care of personal grooming?: A Little Help from another person toileting, which includes using toliet, bedpan, or urinal?: A Little Help from another person bathing (including washing, rinsing, drying)?: A Little Help from another person to put on and taking off regular upper body clothing?: None Help from another person to put on and taking off regular lower body clothing?: A Little 6 Click Score: 20   End of Session Equipment Utilized During Treatment: Rolling walker;Oxygen;Gait belt Nurse Communication: Mobility status  Activity Tolerance: Patient tolerated treatment well Patient left: in chair;with call bell/phone within reach;with family/visitor present  OT Visit Diagnosis: Unsteadiness on feet (R26.81);Muscle weakness (generalized) (M62.81);Other (comment)(decreased activity tolerance)                Time: 6283-1517 OT Time Calculation (min): 39 min Charges:  OT General Charges $OT Visit: 1 Visit OT Evaluation $OT Eval Moderate Complexity: 1 Mod OT Treatments $Self Care/Home Management : 23-37  mins  Marcy Siren, OT [facility] Pager [phone] Office [phone]   [name] 03/07/2019, 4:16 PM

## 2019-03-07 NOTE — Progress Notes (Signed)
PROGRESS NOTE                                                                                                                                                                                                             Patient Demographics:    Kayla Brown, is a 68 y.o. female, DOB - [birth year], WJX:[id number]  Admit date - 03/03/2019   Admitting Physician John Giovanni, MD  Outpatient Primary MD for the patient is Sheliah Hatch, MD  LOS - 3   Chief Complaint  Patient presents with   Shortness of Breath   Hypertension       Brief Narrative    68 y.o. female with medical history significant of hypertension, pulmonary hypertension, diabetes, morbid obesity, obstructive sleep apnea presenting to the hospital for evaluation of shortness of breath and hypertension, work-up significant for hypoxia, volume overload, she was admitted for IV diuresis.  Patient with increased oxygen requirement 9/10, at 8 L nasal cannula, PCCM consulted, work-up significant for atelectasis and pulmonary edema.    Subjective:    Kayla Brown today reports she has been compliant with incentive spirometry, denies fever, cough or chills, no chest pain, currently on 3 L nasal cannula .   Assessment  & Plan :    Principal Problem:   Acute respiratory failure with hypoxia (HCC) Active Problems:   Essential hypertension, benign   OSA (obstructive sleep apnea)   Pulmonary edema   AKI (acute kidney injury) (HCC)  Acute hypoxic respiratory failure secondary to pulmonary edema -Secondary to volume overload, and atelectasis, patient with increased oxygen requirement to 8 L nasal cannula yesterday. -Pulmonary input greatly appreciated, this is most likely in the setting of acute pulmonary edema, and poor inspiratory effort/atelectasis -Continue with IV diuresis, she is -3.6 L since admission, only 240 cc over last 24 hours, I will increase her Lasix to 60 mg IV twice  daily . -CPAP at bed night to improve inspiratory effort.  We will get out of bed to chair, she was encouraged to use incentive spirometry -VQ scan with no evidence of PE, heparin GTT has been stopped .  AKI on CKD 3 -Baseline creatinine 1.2, was 1.7 on presentation, improving with IV diuresis .  Uncontrolled hypertension -Blood pressure  improved with amlodipine, hydralazine. -Hold home ACE inhibitor and diuretics given AKI  OSA -CPAP  at night  Diabetes mellitus -A1c is 8  -Her CBG during hospital stay today is significantly uncontrolled, increased her Lantus dose significantly, for now continue with Lantus 35 units subcu daily, and 15 units subcu nightly, and 6 units before meals, and sliding scale .    Code Status : Full  Family Communication  : D/W patient  Disposition Plan  : Home  Barriers For Discharge : Remains on oxygen, hypervolemic, on IV diuresis  Consults  :  none  Procedures  : None  DVT Prophylaxis  :  [place] Heparin   Lab Results  Component Value Date   PLT 403 (H) 03/07/2019    Antibiotics  :    Anti-infectives (From admission, onward)   None        Objective:   Vitals:   03/07/19 0700 03/07/19 0834 03/07/19 0900 03/07/19 1100  BP: (!) 114/46  (!) 167/47 (!) 135/50  Pulse: 86   85  Resp: (!) 21   (!) 24  Temp: 99.3 F (37.4 C)   98.4 F (36.9 C)  TempSrc: Axillary   Oral  SpO2: 95% 94%  95%  Weight:      Height:        Wt Readings from Last 3 Encounters:  03/07/19 97.7 kg  02/16/19 102.9 kg  02/05/19 100.7 kg     Intake/Output Summary (Last 24 hours) at 03/07/2019 1458 Last data filed at 03/07/2019 0525 Gross per 24 hour  Intake 120 ml  Output 900 ml  Net -780 ml     Physical Exam  Awake Alert, Oriented X 3, No new F.N deficits, Normal affect Symmetrical Chest wall movement, coarse, with diminished air entry at the bases RRR,No Gallops,Rubs or new Murmurs, No Parasternal Heave +ve B.Sounds, Abd Soft, No tenderness, No  rebound - guarding or rigidity. No Cyanosis, Clubbing or edema, No new Rash or bruise         Data Review:    CBC Recent Labs  Lab 03/03/19 1717 03/04/19 0416 03/05/19 0353 03/06/19 0440 03/07/19 0512  WBC 9.6 9.3 9.9 11.1* 12.3*  HGB 10.7* 10.3* 11.1* 11.3* 12.4  HCT 33.7* 32.7* 33.9* 33.6* 37.3  PLT 255 257 309 349 403*  MCV 91.8 92.1 89.0 86.8 87.8  MCH 29.2 29.0 29.1 29.2 29.2  MCHC 31.8 31.5 32.7 33.6 33.2  RDW 14.7 14.6 14.4 14.3 14.0  LYMPHSABS 1.2  --   --   --   --   MONOABS 1.1*  --   --   --   --   EOSABS 0.7*  --   --   --   --   BASOSABS 0.0  --   --   --   --     Chemistries  Recent Labs  Lab 03/03/19 1717 03/04/19 0416 03/05/19 0353 03/06/19 0440 03/07/19 0512  NA 137 137 133* 135 134*  K 4.7 4.8 4.6 3.9 4.2  CL 108 111 104 101 102  CO2 18* 16* 18* 21* 20*  GLUCOSE 357* 329* 374* 266* 273*  BUN 53* 48* 47* 51* 65*  CREATININE 1.79* 1.40* 1.45* 1.54* 1.47*  CALCIUM 9.4 9.3 9.4 9.4 9.6  AST 20  --   --   --   --   ALT 38  --   --   --   --   ALKPHOS 56  --   --   --   --   BILITOT 0.3  --   --   --   --    ------------------------------------------------------------------------------------------------------------------  No results for input(s): CHOL, HDL, LDLCALC, TRIG, CHOLHDL, LDLDIRECT in the last 72 hours.  Lab Results  Component Value Date   HGBA1C 8.0 (H) 03/06/2019   ------------------------------------------------------------------------------------------------------------------ No results for input(s): TSH, T4TOTAL, T3FREE, THYROIDAB in the last 72 hours.  Invalid input(s): FREET3 ------------------------------------------------------------------------------------------------------------------ Recent Labs    03/05/19 1541  FERRITIN 62    Coagulation profile No results for input(s): INR, PROTIME in the last 168 hours.  Recent Labs    03/05/19 1541  DDIMER 1.65*    Cardiac Enzymes No results for input(s): CKMB,  TROPONINI, MYOGLOBIN in the last 168 hours.  Invalid input(s): CK ------------------------------------------------------------------------------------------------------------------    Component Value Date/Time   BNP 50.8 03/03/2019 2357   BNP 25.5 04/08/2015 0753    Inpatient Medications  Scheduled Meds:  amLODipine  10 mg Oral Daily   atorvastatin  40 mg Oral q1800   chlorthalidone  25 mg Oral Daily   cholecalciferol  2,000 Units Oral Daily   fluticasone  2 spray Each Nare Daily   furosemide  60 mg Intravenous BID   heparin injection (subcutaneous)  5,000 Units Subcutaneous Q8H   hydrALAZINE  25 mg Oral Q6H   insulin aspart  0-15 Units Subcutaneous TID WC   insulin aspart  0-5 Units Subcutaneous QHS   insulin aspart  6 Units Subcutaneous TID WC   insulin glargine  15 Units Subcutaneous QHS   insulin glargine  35 Units Subcutaneous Daily   loratadine  10 mg Oral Daily   Melatonin  3 mg Oral QHS   multivitamin  1 tablet Oral Daily   omega-3 acid ethyl esters  1 g Oral Daily   sertraline  50 mg Oral Daily   umeclidinium-vilanterol  1 puff Inhalation Daily   Continuous Infusions:  PRN Meds:.acetaminophen **OR** acetaminophen, hydrALAZINE, simethicone  Micro Results Recent Results (from the past 240 hour(s))  SARS Coronavirus 2 [facility] order, Performed in [facility] hospital lab) Nasopharyngeal Nasopharyngeal Swab     Status: None   Collection Time: 03/04/19 12:01 AM   Specimen: Nasopharyngeal Swab  Result Value Ref Range Status   SARS Coronavirus 2 NEGATIVE NEGATIVE Final    Comment: (NOTE) If result is NEGATIVE SARS-CoV-2 target nucleic acids are NOT DETECTED. The SARS-CoV-2 RNA is generally detectable in upper and lower  respiratory specimens during the acute phase of infection. The lowest  concentration of SARS-CoV-2 viral copies this assay can detect is 250  copies / mL. A negative result does not preclude SARS-CoV-2 infection  and should not  be used as the sole basis for treatment or other  patient management decisions.  A negative result may occur with  improper specimen collection / handling, submission of specimen other  than nasopharyngeal swab, presence of viral mutation(s) within the  areas targeted by this assay, and inadequate number of viral copies  (<250 copies / mL). A negative result must be combined with clinical  observations, patient history, and epidemiological information. If result is POSITIVE SARS-CoV-2 target nucleic acids are DETECTED. The SARS-CoV-2 RNA is generally detectable in upper and lower  respiratory specimens dur ing the acute phase of infection.  Positive  results are indicative of active infection with SARS-CoV-2.  Clinical  correlation with patient history and other diagnostic information is  necessary to determine patient infection status.  Positive results do  not rule out bacterial infection or co-infection with other viruses. If result is PRESUMPTIVE POSTIVE SARS-CoV-2 nucleic acids MAY BE PRESENT.   A presumptive positive result was obtained  on the submitted specimen  and confirmed on repeat testing.  While 2019 novel coronavirus  (SARS-CoV-2) nucleic acids may be present in the submitted sample  additional confirmatory testing may be necessary for epidemiological  and / or clinical management purposes  to differentiate between  SARS-CoV-2 and other Sarbecovirus currently known to infect humans.  If clinically indicated additional testing with an alternate test  methodology [phone]) is advised. The SARS-CoV-2 RNA is generally  detectable in upper and lower respiratory sp ecimens during the acute  phase of infection. The expected result is Negative. Fact Sheet for Patients:  StrictlyIdeas.no Fact Sheet for Healthcare Providers: BankingDealers.co.za This test is not yet approved or cleared by the [place] FDA and has been authorized  for detection and/or diagnosis of SARS-CoV-2 by FDA under an Emergency Use Authorization (EUA).  This EUA will remain in effect (meaning this test can be used) for the duration of the COVID-19 declaration under Section 564(b)(1) of the Act, 21 U.S.C. section 360bbb-3(b)(1), unless the authorization is terminated or revoked sooner. Performed at [facility], [place] [address] [place], [place] [address]   Urine Culture     Status: Abnormal   Collection Time: 03/04/19  8:47 AM   Specimen: Urine, Random  Result Value Ref Range Status   Specimen Description URINE, RANDOM  Final   Special Requests NONE  Final   Culture (A)  Final    <10,000 COLONIES/mL INSIGNIFICANT GROWTH Performed at [facility], [place] [address], [place], [place] [address]    Report Status 03/05/2019 FINAL  Final  SARS Coronavirus 2 [facility] order, Performed in [facility] hospital lab) Nasopharyngeal Nasopharyngeal Swab     Status: None   Collection Time: 03/05/19  3:30 PM   Specimen: Nasopharyngeal Swab  Result Value Ref Range Status   SARS Coronavirus 2 NEGATIVE NEGATIVE Final    Comment: (NOTE) If result is NEGATIVE SARS-CoV-2 target nucleic acids are NOT DETECTED. The SARS-CoV-2 RNA is generally detectable in upper and lower  respiratory specimens during the acute phase of infection. The lowest  concentration of SARS-CoV-2 viral copies this assay can detect is 250  copies / mL. A negative result does not preclude SARS-CoV-2 infection  and should not be used as the sole basis for treatment or other  patient management decisions.  A negative result may occur with  improper specimen collection / handling, submission of specimen other  than nasopharyngeal swab, presence of viral mutation(s) within the  areas targeted by this assay, and inadequate number of viral copies  (<250 copies / mL). A negative result must be combined with clinical  observations, patient history, and epidemiological  information. If result is POSITIVE SARS-CoV-2 target nucleic acids are DETECTED. The SARS-CoV-2 RNA is generally detectable in upper and lower  respiratory specimens dur ing the acute phase of infection.  Positive  results are indicative of active infection with SARS-CoV-2.  Clinical  correlation with patient history and other diagnostic information is  necessary to determine patient infection status.  Positive results do  not rule out bacterial infection or co-infection with other viruses. If result is PRESUMPTIVE POSTIVE SARS-CoV-2 nucleic acids MAY BE PRESENT.   A presumptive positive result was obtained on the submitted specimen  and confirmed on repeat testing.  While 2019 novel coronavirus  (SARS-CoV-2) nucleic acids may be present in the submitted sample  additional confirmatory testing may be necessary for epidemiological  and / or clinical management purposes  to differentiate between  SARS-CoV-2  and other Sarbecovirus currently known to infect humans.  If clinically indicated additional testing with an alternate test  methodology [phone] is advised. The SARS-CoV-2 RNA is generally  detectable in upper and lower respiratory sp ecimens during the acute  phase of infection. The expected result is Negative. Fact Sheet for Patients:  BoilerBrush.com.cyhttps://www.fda.gov/media/136312/download Fact Sheet for Healthcare Providers: https://pope.com/https://www.fda.gov/media/136313/download This test is not yet approved or cleared by the [place] FDA and has been authorized for detection and/or diagnosis of SARS-CoV-2 by FDA under an Emergency Use Authorization (EUA).  This EUA will remain in effect (meaning this test can be used) for the duration of the COVID-19 declaration under Section 564(b)(1) of the Act, 21 U.S.C. section 360bbb-3(b)(1), unless the authorization is terminated or revoked sooner. Performed at [facility], [address] [place], [place] [address]   Respiratory Panel by PCR      Status: None   Collection Time: 03/05/19  6:25 PM   Specimen: Nasopharyngeal Swab; Respiratory  Result Value Ref Range Status   Adenovirus NOT DETECTED NOT DETECTED Final   Coronavirus 229E NOT DETECTED NOT DETECTED Final    Comment: (NOTE) The Coronavirus on the Respiratory Panel, DOES NOT test for the novel  Coronavirus (2019 nCoV)    Coronavirus HKU1 NOT DETECTED NOT DETECTED Final   Coronavirus NL63 NOT DETECTED NOT DETECTED Final   Coronavirus OC43 NOT DETECTED NOT DETECTED Final   Metapneumovirus NOT DETECTED NOT DETECTED Final   Rhinovirus / Enterovirus NOT DETECTED NOT DETECTED Final   Influenza A NOT DETECTED NOT DETECTED Final   Influenza B NOT DETECTED NOT DETECTED Final   Parainfluenza Virus 1 NOT DETECTED NOT DETECTED Final   Parainfluenza Virus 2 NOT DETECTED NOT DETECTED Final   Parainfluenza Virus 3 NOT DETECTED NOT DETECTED Final   Parainfluenza Virus 4 NOT DETECTED NOT DETECTED Final   Respiratory Syncytial Virus NOT DETECTED NOT DETECTED Final   Bordetella pertussis NOT DETECTED NOT DETECTED Final   Chlamydophila pneumoniae NOT DETECTED NOT DETECTED Final   Mycoplasma pneumoniae NOT DETECTED NOT DETECTED Final    Comment: Performed at [facility], [address] [place], [place] [address]    Radiology Reports Dg Chest 2 View  Result Date: 03/03/2019 CLINICAL DATA:  Pneumonia.  Short of breath. EXAM: CHEST - 2 VIEW COMPARISON:  02/17/2019 FINDINGS: Mild cardiac enlargement. Pulmonary vascular congestion identified. Lungs are hyperinflated and there are coarsened interstitial markings noted bilaterally. Pleural thickening along the major fissure is again noted. Scar or atelectasis noted in the right middle lobe. IMPRESSION: Cardiac enlargement and mild pulmonary vascular congestion with thickening along the major fissure of the right lung. Findings may reflect early CHF. No airspace consolidation identified. Electronically Signed   By: Signa Kellaylor  Stroud M.D.   On:  03/03/2019 17:55   Dg Chest 2 View  Result Date: 02/18/2019 CLINICAL DATA:  Dyspnea on exertion.  Pulmonary hypertension. EXAM: CHEST - 2 VIEW COMPARISON:  None. FINDINGS: Normal heart size. Normal mediastinal contour. No pneumothorax. No pleural effusion. Patchy right middle lobe opacity. No pulmonary edema. IMPRESSION: Patchy right middle lobe opacity suggests a pneumonia. Recommend follow-up PA and lateral post treatment chest radiographs in 4-6 weeks. Electronically Signed   By: Delbert PhenixJason A Poff M.D.   On: 02/18/2019 08:47   Ct Chest Wo Contrast  Result Date: 03/05/2019 CLINICAL DATA:  Acute hypoxemic respiratory failure EXAM: CT CHEST WITHOUT CONTRAST TECHNIQUE: Multidetector CT imaging of the chest was performed following the standard protocol without IV contrast.  COMPARISON:  CT 04/05/2015 FINDINGS: Cardiovascular: Normal heart size. No pericardial effusion. Atherosclerotic calcification of the aortic leaflets is noted. There is mitral annular calcification is well. The aorta is normal caliber with atheromatous plaque. Additional calcifications are present in the proximal great vessels and upper abdominal aorta. Major venous structures are unremarkable. Mediastinum/Nodes: Numerous though nonenlarged mediastinal nodes are present. Evaluation for hilar adenopathy is limited in the absence of contrast. No axillary adenopathy. Thyroid gland and thoracic inlet are unremarkable. Bowing of the posterior trachea 0 likely related to imaging during exhalation. There is mild airways thickening. The esophagus is unremarkable. Lungs/Pleura: Redemonstration of mosaic attenuation throughout the lungs superimposed on centrilobular and paraseptal emphysema. There is increasing bandlike areas of subsegmental atelectasis and/or scarring as well as additional atelectatic change posteriorly in the lung bases. No pneumothorax. No effusion. Stable subpleural area of scarring is seen in the right middle lobe. Upper Abdomen:  Small accessory splenule. Mild bilateral symmetric perinephric stranding, a nonspecific finding though may correlate with either age or decreased renal function. Portion of the hepatic flexure is interposed anterior to the liver. Fatty replacement of the pancreas. No pancreatic ductal dilatation or surrounding inflammatory changes. No acute abnormalities present in the visualized portions of the upper abdomen. Musculoskeletal: Multilevel degenerative changes are present in the imaged portions of the spine. No acute osseous abnormality or suspicious osseous lesion. IMPRESSION: 1. Extensive atelectatic changes are present throughout the lungs with a dependent predominance. 2. Redemonstration of mosaic attenuation throughout the lungs superimposed on centrilobular and paraseptal emphysema, suggestive of small airways disease. 3. Mild bilateral symmetric perinephric stranding, a nonspecific finding though may correlate with either age or decreased renal function. 4. Aortic Atherosclerosis (ICD10-I70.0) and Emphysema (ICD10-J43.9). Electronically Signed   By: Kreg Shropshire M.D.   On: 03/05/2019 22:41   Nm Pulmonary Perf And Vent  Result Date: 03/04/2019 CLINICAL DATA:  Evaluate for pulmonary embolism. Short of breath with hypertension. EXAM: NUCLEAR MEDICINE VENTILATION - PERFUSION LUNG SCAN TECHNIQUE: Ventilation images were obtained in multiple projections using inhaled aerosol Tc-15m DTPA. Perfusion images were obtained in multiple projections after intravenous injection of Tc-40m MAA. RADIOPHARMACEUTICALS:  32.7 mCi of Tc-70m DTPA aerosol inhalation and 1.53 mCi Tc6m MAA IV COMPARISON:  Chest radiograph 03/03/2019 FINDINGS: Ventilation: Large segmental ventilation defect identified involving the superior segment of left lower lobe. Medium to large segmental perfusion defect identified within the lateral left lower lobe. Perfusion: Corresponding perfusion abnormalities are noted within the superior segment of left  lower lobe and lateral left lower lobe. IMPRESSION: No unmatched segmental perfusion defects identified to suggest acute pulmonary embolus. Electronically Signed   By: Signa Kell M.D.   On: 03/04/2019 15:54   Dg Chest Port 1 View  Result Date: 03/05/2019 CLINICAL DATA:  Hypoxia EXAM: PORTABLE CHEST 1 VIEW COMPARISON:  03/03/2019 FINDINGS: Cardiomegaly. Mild irregular scarring or atelectasis of the mid lungs bilaterally. No acute appearing airspace opacity. IMPRESSION: Cardiomegaly. Mild irregular scarring or atelectasis of the mid lungs bilaterally. No acute appearing airspace opacity. Electronically Signed   By: Lauralyn Primes M.D.   On: 03/05/2019 15:07     Huey Bienenstock M.D on 03/07/2019 at 2:58 PM  Between 7am to 7pm - Pager - [phone]  After 7pm go to [email or web address] - password [facility]  [facility] -  Office  [phone]

## 2019-03-07 NOTE — Progress Notes (Signed)
Physical Therapy Treatment Patient Details Name: Kayla MirzaVickie Leng MRN: [id number] DOB: 04/11/1951 Today's Date: 03/07/2019    History of Present Illness 68 y.o. female with medical history significant of hypertension, pulmonary hypertension, diabetes, morbid obesity, obstructive sleep apnea presenting 03/03/19 to the hospital for evaluation of shortness of breath and hypertension.  Patient states her blood pressure has been high for very long time with SBP 220 in ED. Chest x-ray showing cardiac enlargement and mild pulmonary vascular congestion. Findings thought to reflect early CHF. V/Q negative. COVID-19 negative. CBGs as high as 600.     PT Comments    Pt progressing well towards physical therapy goals. Ambulating 150 feet with Rollator and min guard assist, SpO2 84-96% on 3 L O2. Needs cues for taking rest breaks and for activity pacing. Education provided re: energy conservation techniques, generalized walking program, activity progression. Pt remains very motivated and eager to participate. Pt husband very supportive as well.     Follow Up Recommendations  Home health PT;Supervision - Intermittent     Equipment Recommendations  None recommended by PT    Recommendations for Other Services       Precautions / Restrictions Precautions Precautions: Other (comment) Precaution Comments: watch O2 Restrictions Weight Bearing Restrictions: No    Mobility  Bed Mobility               General bed mobility comments: received OOB in recliner  Transfers Overall transfer level: Needs assistance Equipment used: Rolling walker (2 wheeled) Transfers: Sit to/from Stand Sit to Stand: Supervision         General transfer comment: for safety/lines  Ambulation/Gait Ambulation/Gait assistance: Min guard Gait Distance (Feet): 150 Feet Assistive device: 4-wheeled walker Gait Pattern/deviations: Step-through pattern;Decreased stride length;Wide base of support Gait velocity: slow    General Gait Details: Mildly unsteady requiring min guard assist, slow pace, cues for activity pacing and rest breaks   Stairs             Wheelchair Mobility    Modified Rankin (Stroke Patients Only)       Balance Overall balance assessment: Needs assistance Sitting-balance support: Feet supported Sitting balance-Leahy Scale: Good     Standing balance support: Bilateral upper extremity supported;Single extremity supported;During functional activity Standing balance-Leahy Scale: Poor Standing balance comment: pt seeking UE support today (at least single UE)                            Cognition Arousal/Alertness: Awake/alert Behavior During Therapy: WFL for tasks assessed/performed Overall Cognitive Status: Within Functional Limits for tasks assessed                                        Exercises Other Exercises Other Exercises: Serial sit to stands x 5    General Comments General comments (skin integrity, edema, etc.): pt on 3L O2 with O2 sats maintaining >90% with standing activity      Pertinent Vitals/Pain Pain Assessment: No/denies pain    Home Living Family/patient expects to be discharged to:: Private residence Living Arrangements: Spouse/significant other Available Help at Discharge: Family;Available PRN/intermittently(spouse works during day) Type of Home: Mobile home Home Access: Level entry   Home Layout: One level Home Equipment: Grab bars - tub/shower;Walker - 4 wheels;Grab bars - toilet;Shower seat      Prior Function Level of Independence: Independent  Comments: does not drive; has been walking on a treadmill in the gym at apartments and has not been able to increase her stamina (max 12-14 minutes at 2.0 mph)   PT Goals (current goals can now be found in the care plan section) Acute Rehab PT Goals Patient Stated Goal: go home and not need oxygen or walker; be able to continue to perform  ADL/iADL Potential to Achieve Goals: Good Progress towards PT goals: Progressing toward goals    Frequency    Min 3X/week      PT Plan Current plan remains appropriate    Co-evaluation              AM-PAC PT "6 Clicks" Mobility   Outcome Measure  Help needed turning from your back to your side while in a flat bed without using bedrails?: None Help needed moving from lying on your back to sitting on the side of a flat bed without using bedrails?: None Help needed moving to and from a bed to a chair (including a wheelchair)?: A Little Help needed standing up from a chair using your arms (e.g., wheelchair or bedside chair)?: A Little Help needed to walk in hospital room?: A Little Help needed climbing 3-5 steps with a railing? : A Little 6 Click Score: 20    End of Session Equipment Utilized During Treatment: Gait belt;Oxygen Activity Tolerance: Patient tolerated treatment well Patient left: in chair;with call bell/phone within reach;with family/visitor present Nurse Communication: Mobility status PT Visit Diagnosis: Muscle weakness (generalized) (M62.81);Difficulty in walking, not elsewhere classified (R26.2)     Time: 4818-5631 PT Time Calculation (min) (ACUTE ONLY): 29 min  Charges:  $Gait Training: 8-22 mins $Therapeutic Activity: 8-22 mins                     Ellamae Sia, PT, DPT Acute Rehabilitation Services Pager [phone] Office [phone]    Willy Eddy 03/07/2019, 5:34 PM

## 2019-03-08 DIAGNOSIS — R0600 Dyspnea, unspecified: Secondary | ICD-10-CM | POA: Insufficient documentation

## 2019-03-08 LAB — BASIC METABOLIC PANEL
Anion gap: 11 (ref 5–15)
BUN: 75 mg/dL — ABNORMAL HIGH (ref 8–23)
CO2: 23 mmol/L (ref 22–32)
Calcium: 9.6 mg/dL (ref 8.9–10.3)
Chloride: 98 mmol/L (ref 98–111)
Creatinine, Ser: 1.48 mg/dL — ABNORMAL HIGH (ref 0.44–1.00)
GFR calc Af Amer: 42 mL/min — ABNORMAL LOW (ref >60)
GFR calc non Af Amer: 37 mL/min — ABNORMAL LOW (ref >60)
Glucose, Bld: 319 mg/dL — ABNORMAL HIGH (ref 70–99)
Potassium: 3.9 mmol/L (ref 3.5–5.1)
Sodium: 132 mmol/L — ABNORMAL LOW (ref 135–145)

## 2019-03-08 LAB — CBC
HCT: 35.2 % — ABNORMAL LOW (ref 36.0–46.0)
Hemoglobin: 12.1 g/dL (ref 12.0–15.0)
MCH: 29.5 pg (ref 26.0–34.0)
MCHC: 34.4 g/dL (ref 30.0–36.0)
MCV: 85.9 fL (ref 80.0–100.0)
Platelets: 407 10*3/uL — ABNORMAL HIGH (ref 150–400)
RBC: 4.1 MIL/uL (ref 3.87–5.11)
RDW: 14 % (ref 11.5–15.5)
WBC: 13 10*3/uL — ABNORMAL HIGH (ref 4.0–10.5)
nRBC: 0 % (ref 0.0–0.2)

## 2019-03-08 LAB — GLUCOSE, CAPILLARY
Glucose-Capillary: 354 mg/dL — ABNORMAL HIGH (ref 70–99)
Glucose-Capillary: 514 mg/dL (ref 70–99)
Glucose-Capillary: 483 mg/dL — ABNORMAL HIGH (ref 70–99)
Glucose-Capillary: 104 mg/dL — ABNORMAL HIGH (ref 70–99)

## 2019-03-08 MED ORDER — INSULIN ASPART 100 UNIT/ML ~~LOC~~ SOLN
5.0000 [IU] | Freq: Once | SUBCUTANEOUS | Status: AC
  Administered 2019-03-08: 5 [IU] via SUBCUTANEOUS

## 2019-03-08 MED ORDER — FUROSEMIDE 10 MG/ML IJ SOLN
60.0000 mg | Freq: Three times a day (TID) | INTRAMUSCULAR | Status: DC
  Administered 2019-03-08 – 2019-03-09 (×3): 60 mg via INTRAVENOUS
  Filled 2019-03-08 (×3): qty 6

## 2019-03-08 MED ORDER — GLIPIZIDE ER 10 MG PO TB24
10.0000 mg | ORAL_TABLET | Freq: Every day | ORAL | Status: DC
  Administered 2019-03-09 – 2019-03-14 (×6): 10 mg via ORAL
  Filled 2019-03-08 (×8): qty 1

## 2019-03-08 MED ORDER — INSULIN GLARGINE 100 UNIT/ML ~~LOC~~ SOLN
20.0000 [IU] | SUBCUTANEOUS | Status: AC
  Administered 2019-03-08: 20 [IU] via SUBCUTANEOUS
  Filled 2019-03-08: qty 0.2

## 2019-03-08 MED ORDER — ONDANSETRON HCL 4 MG/2ML IJ SOLN
4.0000 mg | Freq: Four times a day (QID) | INTRAMUSCULAR | Status: DC | PRN
  Administered 2019-03-08 – 2019-03-10 (×3): 4 mg via INTRAVENOUS
  Filled 2019-03-08 (×3): qty 2

## 2019-03-08 MED ORDER — GLIPIZIDE ER 10 MG PO TB24: 10.0000 mg | ORAL_TABLET | Freq: Every day | ORAL | Status: DC

## 2019-03-08 MED ORDER — INSULIN GLARGINE 100 UNIT/ML ~~LOC~~ SOLN
55.0000 [IU] | Freq: Every day | SUBCUTANEOUS | Status: DC
  Administered 2019-03-09 – 2019-03-10 (×2): 55 [IU] via SUBCUTANEOUS
  Filled 2019-03-08 (×2): qty 0.55

## 2019-03-08 MED ORDER — INSULIN ASPART 100 UNIT/ML ~~LOC~~ SOLN
35.0000 [IU] | Freq: Once | SUBCUTANEOUS | Status: AC
  Administered 2019-03-08: 24 [IU] via SUBCUTANEOUS

## 2019-03-08 MED ORDER — INSULIN GLARGINE 100 UNIT/ML ~~LOC~~ SOLN
25.0000 [IU] | Freq: Every day | SUBCUTANEOUS | Status: DC
  Administered 2019-03-08 – 2019-03-09 (×2): 25 [IU] via SUBCUTANEOUS
  Filled 2019-03-08 (×4): qty 0.25

## 2019-03-08 NOTE — Progress Notes (Signed)
PROGRESS NOTE                                                                                                                                                                                                             Patient Demographics:    Kayla Brown, is a 68 y.o. female, DOB - 26-Jul-1950, QQV:[id number]  Admit date - 03/03/2019   Admitting Physician John Giovanni, MD  Outpatient Primary MD for the patient is Sheliah Hatch, MD  LOS - 4   Chief Complaint  Patient presents with   Shortness of Breath   Hypertension       Brief Narrative    68 y.o. female with medical history significant of hypertension, pulmonary hypertension, diabetes, morbid obesity, obstructive sleep apnea presenting to the hospital for evaluation of shortness of breath and hypertension, work-up significant for hypoxia, volume overload, she was admitted for IV diuresis.  Patient with increased oxygen requirement 9/10, at 8 L nasal cannula, PCCM consulted, work-up significant for atelectasis and pulmonary edema.    Subjective:    Lache Dagher today reports he has improved, but she remains on 3 L nasal cannula, reports she got out of bed to chair for 5 hours yesterday, she denies any chest pain .   Assessment  & Plan :    Principal Problem:   Acute respiratory failure with hypoxia (HCC) Active Problems:   Essential hypertension, benign   OSA (obstructive sleep apnea)   Pulmonary edema   AKI (acute kidney injury) (HCC)  Acute hypoxic respiratory failure secondary to pulmonary edema - Secondary to volume overload, and atelectasis, patient with increased oxygen requirement to 8 L nasal cannula , this has been improving, she remains on 3 L nasal cannula today. -Pulmonary input greatly appreciated, this is most likely in the setting of acute pulmonary edema, and poor inspiratory effort/atelectasis -Encouraged to his incentive spirometry -Fluid restriction,  daily weight, strict ins and outs, so far -4.4 L since admission, will increase Lasix to 60 mg IV 3 times daily. -CPAP at night time to improve inspiratory effort.  We will get out of bed to chair, she was encouraged to use incentive spirometry. -VQ scan with no evidence of PE, heparin GTT has been stopped .  AKI on CKD 3 -Baseline creatinine 1.2, was 1.7 on presentation, improving with IV diuresis .  Uncontrolled hypertension -Blood pressure  improved with amlodipine, hydralazine. -Hold home ACE inhibitor and diuretics given AKI  OSA -CPAP at night  Diabetes mellitus -A1c is 8  -Her CBG during hospital stay today is significantly uncontrolled, despite rapidly uptitrating her Lantus, her CBGs in the 500s range this afternoon, I will resume her home dose glipizide XL 10 mg oral daily, and well increase her evening Lantus to 25 units, and daytime Lantus to 55 units.  Continue with insulin sliding scale   Code Status : Full  Family Communication  : D/W Husband at bedside  Disposition Plan  : Home  Barriers For Discharge : Remains on oxygen, hypervolemic, on IV diuresis  Consults  :  none  Procedures  : None  DVT Prophylaxis  :  [place] Heparin   Lab Results  Component Value Date   PLT 407 (H) 03/08/2019    Antibiotics  :    Anti-infectives (From admission, onward)   None        Objective:   Vitals:   03/08/19 0608 03/08/19 0730 03/08/19 0849 03/08/19 1143  BP: (!) 106/37 (!) 110/56  (!) 153/60  Pulse:  93  91  Resp:  20  (!) 24  Temp:  98.1 F (36.7 C)  97.6 F (36.4 C)  TempSrc:  Oral    SpO2:  93% 96% 96%  Weight:      Height:        Wt Readings from Last 3 Encounters:  03/07/19 97.7 kg  02/16/19 102.9 kg  02/05/19 100.7 kg     Intake/Output Summary (Last 24 hours) at 03/08/2019 1340 Last data filed at 03/08/2019 0700 Gross per 24 hour  Intake 1 ml  Output 800 ml  Net -799 ml     Physical Exam  Awake Alert, Oriented X 3, No new F.N deficits,  Normal affect Symmetrical Chest wall movement, improved air entry at the bases, no wheezing RRR,No Gallops,Rubs or new Murmurs, No Parasternal Heave +ve B.Sounds, Abd Soft, No tenderness, No rebound - guarding or rigidity. No Cyanosis, Clubbing ,+1 edema, No new Rash or bruise          Data Review:    CBC Recent Labs  Lab 03/03/19 1717 03/04/19 0416 03/05/19 0353 03/06/19 0440 03/07/19 0512 03/08/19 0656  WBC 9.6 9.3 9.9 11.1* 12.3* 13.0*  HGB 10.7* 10.3* 11.1* 11.3* 12.4 12.1  HCT 33.7* 32.7* 33.9* 33.6* 37.3 35.2*  PLT 255 257 309 349 403* 407*  MCV 91.8 92.1 89.0 86.8 87.8 85.9  MCH 29.2 29.0 29.1 29.2 29.2 29.5  MCHC 31.8 31.5 32.7 33.6 33.2 34.4  RDW 14.7 14.6 14.4 14.3 14.0 14.0  LYMPHSABS 1.2  --   --   --   --   --   MONOABS 1.1*  --   --   --   --   --   EOSABS 0.7*  --   --   --   --   --   BASOSABS 0.0  --   --   --   --   --     Chemistries  Recent Labs  Lab 03/03/19 1717 03/04/19 0416 03/05/19 0353 03/06/19 0440 03/07/19 0512 03/08/19 0656  NA 137 137 133* 135 134* 132*  K 4.7 4.8 4.6 3.9 4.2 3.9  CL 108 111 104 101 102 98  CO2 18* 16* 18* 21* 20* 23  GLUCOSE 357* 329* 374* 266* 273* 319*  BUN 53* 48* 47* 51* 65* 75*  CREATININE 1.79*  1.40* 1.45* 1.54* 1.47* 1.48*  CALCIUM 9.4 9.3 9.4 9.4 9.6 9.6  AST 20  --   --   --   --   --   ALT 38  --   --   --   --   --   ALKPHOS 56  --   --   --   --   --   BILITOT 0.3  --   --   --   --   --    ------------------------------------------------------------------------------------------------------------------ No results for input(s): CHOL, HDL, LDLCALC, TRIG, CHOLHDL, LDLDIRECT in the last 72 hours.  Lab Results  Component Value Date   HGBA1C 8.0 (H) 03/06/2019   ------------------------------------------------------------------------------------------------------------------ No results for input(s): TSH, T4TOTAL, T3FREE, THYROIDAB in the last 72 hours.  Invalid input(s):  FREET3 ------------------------------------------------------------------------------------------------------------------ Recent Labs    03/05/19 1541  FERRITIN 62    Coagulation profile No results for input(s): INR, PROTIME in the last 168 hours.  Recent Labs    03/05/19 1541  DDIMER 1.65*    Cardiac Enzymes No results for input(s): CKMB, TROPONINI, MYOGLOBIN in the last 168 hours.  Invalid input(s): CK ------------------------------------------------------------------------------------------------------------------    Component Value Date/Time   BNP 50.8 03/03/2019 2357   BNP 25.5 04/08/2015 0753    Inpatient Medications  Scheduled Meds:  amLODipine  10 mg Oral Daily   atorvastatin  40 mg Oral q1800   chlorthalidone  25 mg Oral Daily   cholecalciferol  2,000 Units Oral Daily   fluticasone  2 spray Each Nare Daily   furosemide  60 mg Intravenous Q8H   heparin injection (subcutaneous)  5,000 Units Subcutaneous Q8H   hydrALAZINE  25 mg Oral Q6H   insulin aspart  0-15 Units Subcutaneous TID WC   insulin aspart  0-5 Units Subcutaneous QHS   insulin aspart  35 Units Subcutaneous Once   insulin aspart  6 Units Subcutaneous TID WC   insulin glargine  20 Units Subcutaneous NOW   insulin glargine  25 Units Subcutaneous QHS   [START ON 03/09/2019] insulin glargine  55 Units Subcutaneous Daily   loratadine  10 mg Oral Daily   Melatonin  3 mg Oral QHS   multivitamin  1 tablet Oral Daily   omega-3 acid ethyl esters  1 g Oral Daily   sertraline  50 mg Oral Daily   umeclidinium-vilanterol  1 puff Inhalation Daily   Continuous Infusions:  PRN Meds:.acetaminophen **OR** acetaminophen, hydrALAZINE, simethicone  Micro Results Recent Results (from the past 240 hour(s))  SARS Coronavirus 2 [facility] order, Performed in [facility] hospital lab) Nasopharyngeal Nasopharyngeal Swab     Status: None   Collection Time: 03/04/19 12:01 AM   Specimen:  Nasopharyngeal Swab  Result Value Ref Range Status   SARS Coronavirus 2 NEGATIVE NEGATIVE Final    Comment: (NOTE) If result is NEGATIVE SARS-CoV-2 target nucleic acids are NOT DETECTED. The SARS-CoV-2 RNA is generally detectable in upper and lower  respiratory specimens during the acute phase of infection. The lowest  concentration of SARS-CoV-2 viral copies this assay can detect is 250  copies / mL. A negative result does not preclude SARS-CoV-2 infection  and should not be used as the sole basis for treatment or other  patient management decisions.  A negative result may occur with  improper specimen collection / handling, submission of specimen other  than nasopharyngeal swab, presence of viral mutation(s) within the  areas targeted by this assay, and inadequate number of viral copies  (<250 copies / mL).  A negative result must be combined with clinical  observations, patient history, and epidemiological information. If result is POSITIVE SARS-CoV-2 target nucleic acids are DETECTED. The SARS-CoV-2 RNA is generally detectable in upper and lower  respiratory specimens dur ing the acute phase of infection.  Positive  results are indicative of active infection with SARS-CoV-2.  Clinical  correlation with patient history and other diagnostic information is  necessary to determine patient infection status.  Positive results do  not rule out bacterial infection or co-infection with other viruses. If result is PRESUMPTIVE POSTIVE SARS-CoV-2 nucleic acids MAY BE PRESENT.   A presumptive positive result was obtained on the submitted specimen  and confirmed on repeat testing.  While 2019 novel coronavirus  (SARS-CoV-2) nucleic acids may be present in the submitted sample  additional confirmatory testing may be necessary for epidemiological  and / or clinical management purposes  to differentiate between  SARS-CoV-2 and other Sarbecovirus currently known to infect humans.  If clinically  indicated additional testing with an alternate test  methodology [phone]([id number]) is advised. The SARS-CoV-2 RNA is generally  detectable in upper and lower respiratory sp ecimens during the acute  phase of infection. The expected result is Negative. Fact Sheet for Patients:  BoilerBrush.com.cyhttps://www.fda.gov/media/136312/download Fact Sheet for Healthcare Providers: https://pope.com/https://www.fda.gov/media/136313/download This test is not yet approved or cleared by the [place] FDA and has been authorized for detection and/or diagnosis of SARS-CoV-2 by FDA under an Emergency Use Authorization (EUA).  This EUA will remain in effect (meaning this test can be used) for the duration of the COVID-19 declaration under Section 564(b)(1) of the Act, 21 U.S.C. section 360bbb-3(b)(1), unless the authorization is terminated or revoked sooner. Performed at [facility], [address] [place], [place] [address]   Urine Culture     Status: Abnormal   Collection Time: 03/04/19  8:47 AM   Specimen: Urine, Random  Result Value Ref Range Status   Specimen Description URINE, RANDOM  Final   Special Requests NONE  Final   Culture (A)  Final    <10,000 COLONIES/mL INSIGNIFICANT GROWTH Performed at [facility], [name] [place], [place] [address]    Report Status 03/05/2019 FINAL  Final  SARS Coronavirus 2 [facility](Hospital order, Performed in [facility] hospital lab) Nasopharyngeal Nasopharyngeal Swab     Status: None   Collection Time: 03/05/19  3:30 PM   Specimen: Nasopharyngeal Swab  Result Value Ref Range Status   SARS Coronavirus 2 NEGATIVE NEGATIVE Final    Comment: (NOTE) If result is NEGATIVE SARS-CoV-2 target nucleic acids are NOT DETECTED. The SARS-CoV-2 RNA is generally detectable in upper and lower  respiratory specimens during the acute phase of infection. The lowest  concentration of SARS-CoV-2 viral copies this assay can detect is 250  copies / mL. A negative result does not preclude SARS-CoV-2  infection  and should not be used as the sole basis for treatment or other  patient management decisions.  A negative result may occur with  improper specimen collection / handling, submission of specimen other  than nasopharyngeal swab, presence of viral mutation(s) within the  areas targeted by this assay, and inadequate number of viral copies  (<250 copies / mL). A negative result must be combined with clinical  observations, patient history, and epidemiological information. If result is POSITIVE SARS-CoV-2 target nucleic acids are DETECTED. The SARS-CoV-2 RNA is generally detectable in upper and lower  respiratory specimens dur ing the acute phase of infection.  Positive  results are indicative of active infection with SARS-CoV-2.  Clinical  correlation with patient history and other diagnostic information is  necessary to determine patient infection status.  Positive results do  not rule out bacterial infection or co-infection with other viruses. If result is PRESUMPTIVE POSTIVE SARS-CoV-2 nucleic acids MAY BE PRESENT.   A presumptive positive result was obtained on the submitted specimen  and confirmed on repeat testing.  While 2019 novel coronavirus  (SARS-CoV-2) nucleic acids may be present in the submitted sample  additional confirmatory testing may be necessary for epidemiological  and / or clinical management purposes  to differentiate between  SARS-CoV-2 and other Sarbecovirus currently known to infect humans.  If clinically indicated additional testing with an alternate test  methodology [phone]) is advised. The SARS-CoV-2 RNA is generally  detectable in upper and lower respiratory sp ecimens during the acute  phase of infection. The expected result is Negative. Fact Sheet for Patients:  BoilerBrush.com.cy Fact Sheet for Healthcare Providers: https://pope.com/ This test is not yet approved or cleared by the [place]  FDA and has been authorized for detection and/or diagnosis of SARS-CoV-2 by FDA under an Emergency Use Authorization (EUA).  This EUA will remain in effect (meaning this test can be used) for the duration of the COVID-19 declaration under Section 564(b)(1) of the Act, 21 U.S.C. section 360bbb-3(b)(1), unless the authorization is terminated or revoked sooner. Performed at [facility], [address], [place], [place] [address]   Respiratory Panel by PCR     Status: None   Collection Time: 03/05/19  6:25 PM   Specimen: Nasopharyngeal Swab; Respiratory  Result Value Ref Range Status   Adenovirus NOT DETECTED NOT DETECTED Final   Coronavirus 229E NOT DETECTED NOT DETECTED Final    Comment: (NOTE) The Coronavirus on the Respiratory Panel, DOES NOT test for the novel  Coronavirus (2019 nCoV)    Coronavirus HKU1 NOT DETECTED NOT DETECTED Final   Coronavirus NL63 NOT DETECTED NOT DETECTED Final   Coronavirus OC43 NOT DETECTED NOT DETECTED Final   Metapneumovirus NOT DETECTED NOT DETECTED Final   Rhinovirus / Enterovirus NOT DETECTED NOT DETECTED Final   Influenza A NOT DETECTED NOT DETECTED Final   Influenza B NOT DETECTED NOT DETECTED Final   Parainfluenza Virus 1 NOT DETECTED NOT DETECTED Final   Parainfluenza Virus 2 NOT DETECTED NOT DETECTED Final   Parainfluenza Virus 3 NOT DETECTED NOT DETECTED Final   Parainfluenza Virus 4 NOT DETECTED NOT DETECTED Final   Respiratory Syncytial Virus NOT DETECTED NOT DETECTED Final   Bordetella pertussis NOT DETECTED NOT DETECTED Final   Chlamydophila pneumoniae NOT DETECTED NOT DETECTED Final   Mycoplasma pneumoniae NOT DETECTED NOT DETECTED Final    Comment: Performed at [facility], [address], [place], [place] [address]    Radiology Reports Dg Chest 2 View  Result Date: 03/03/2019 CLINICAL DATA:  Pneumonia.  Short of breath. EXAM: CHEST - 2 VIEW COMPARISON:  02/17/2019 FINDINGS: Mild cardiac enlargement. Pulmonary vascular  congestion identified. Lungs are hyperinflated and there are coarsened interstitial markings noted bilaterally. Pleural thickening along the major fissure is again noted. Scar or atelectasis noted in the right middle lobe. IMPRESSION: Cardiac enlargement and mild pulmonary vascular congestion with thickening along the major fissure of the right lung. Findings may reflect early CHF. No airspace consolidation identified. Electronically Signed   By: Signa Kell M.D.   On: 03/03/2019 17:55   Dg Chest 2 View  Result Date: 02/18/2019 CLINICAL  DATA:  Dyspnea on exertion.  Pulmonary hypertension. EXAM: CHEST - 2 VIEW COMPARISON:  None. FINDINGS: Normal heart size. Normal mediastinal contour. No pneumothorax. No pleural effusion. Patchy right middle lobe opacity. No pulmonary edema. IMPRESSION: Patchy right middle lobe opacity suggests a pneumonia. Recommend follow-up PA and lateral post treatment chest radiographs in 4-6 weeks. Electronically Signed   By: Delbert Phenix M.D.   On: 02/18/2019 08:47   Ct Chest Wo Contrast  Result Date: 03/05/2019 CLINICAL DATA:  Acute hypoxemic respiratory failure EXAM: CT CHEST WITHOUT CONTRAST TECHNIQUE: Multidetector CT imaging of the chest was performed following the standard protocol without IV contrast. COMPARISON:  CT 04/05/2015 FINDINGS: Cardiovascular: Normal heart size. No pericardial effusion. Atherosclerotic calcification of the aortic leaflets is noted. There is mitral annular calcification is well. The aorta is normal caliber with atheromatous plaque. Additional calcifications are present in the proximal great vessels and upper abdominal aorta. Major venous structures are unremarkable. Mediastinum/Nodes: Numerous though nonenlarged mediastinal nodes are present. Evaluation for hilar adenopathy is limited in the absence of contrast. No axillary adenopathy. Thyroid gland and thoracic inlet are unremarkable. Bowing of the posterior trachea 0 likely related to imaging  during exhalation. There is mild airways thickening. The esophagus is unremarkable. Lungs/Pleura: Redemonstration of mosaic attenuation throughout the lungs superimposed on centrilobular and paraseptal emphysema. There is increasing bandlike areas of subsegmental atelectasis and/or scarring as well as additional atelectatic change posteriorly in the lung bases. No pneumothorax. No effusion. Stable subpleural area of scarring is seen in the right middle lobe. Upper Abdomen: Small accessory splenule. Mild bilateral symmetric perinephric stranding, a nonspecific finding though may correlate with either age or decreased renal function. Portion of the hepatic flexure is interposed anterior to the liver. Fatty replacement of the pancreas. No pancreatic ductal dilatation or surrounding inflammatory changes. No acute abnormalities present in the visualized portions of the upper abdomen. Musculoskeletal: Multilevel degenerative changes are present in the imaged portions of the spine. No acute osseous abnormality or suspicious osseous lesion. IMPRESSION: 1. Extensive atelectatic changes are present throughout the lungs with a dependent predominance. 2. Redemonstration of mosaic attenuation throughout the lungs superimposed on centrilobular and paraseptal emphysema, suggestive of small airways disease. 3. Mild bilateral symmetric perinephric stranding, a nonspecific finding though may correlate with either age or decreased renal function. 4. Aortic Atherosclerosis (ICD10-I70.0) and Emphysema (ICD10-J43.9). Electronically Signed   By: Kreg Shropshire M.D.   On: 03/05/2019 22:41   Nm Pulmonary Perf And Vent  Result Date: 03/04/2019 CLINICAL DATA:  Evaluate for pulmonary embolism. Short of breath with hypertension. EXAM: NUCLEAR MEDICINE VENTILATION - PERFUSION LUNG SCAN TECHNIQUE: Ventilation images were obtained in multiple projections using inhaled aerosol Tc-57m DTPA. Perfusion images were obtained in multiple projections  after intravenous injection of Tc-35m MAA. RADIOPHARMACEUTICALS:  32.7 mCi of Tc-36m DTPA aerosol inhalation and 1.53 mCi Tc24m MAA IV COMPARISON:  Chest radiograph 03/03/2019 FINDINGS: Ventilation: Large segmental ventilation defect identified involving the superior segment of left lower lobe. Medium to large segmental perfusion defect identified within the lateral left lower lobe. Perfusion: Corresponding perfusion abnormalities are noted within the superior segment of left lower lobe and lateral left lower lobe. IMPRESSION: No unmatched segmental perfusion defects identified to suggest acute pulmonary embolus. Electronically Signed   By: Signa Kell M.D.   On: 03/04/2019 15:54   Dg Chest Port 1 View  Result Date: 03/05/2019 CLINICAL DATA:  Hypoxia EXAM: PORTABLE CHEST 1 VIEW COMPARISON:  03/03/2019 FINDINGS: Cardiomegaly. Mild irregular scarring or atelectasis of the  mid lungs bilaterally. No acute appearing airspace opacity. IMPRESSION: Cardiomegaly. Mild irregular scarring or atelectasis of the mid lungs bilaterally. No acute appearing airspace opacity. Electronically Signed   By: Eddie Candle M.D.   On: 03/05/2019 15:07     Phillips Climes M.D on 03/08/2019 at 1:40 PM  Between 7am to 7pm - Pager - [phone]  After 7pm go to [email or web address] - password [facility]  [facility] -  Office  [phone]

## 2019-03-08 NOTE — Progress Notes (Signed)
CRITICAL VALUE ALERT  Critical Value:  CBG 483  Date & Time Notied:  03/08/2019 1630  Provider Notified: Dr. Waldron Labs   Orders Received/Actions taken: No new orders received at this time.

## 2019-03-08 NOTE — Assessment & Plan Note (Signed)
Primary problem is likely to be obesity with deconditioning Plan- schedule PFT, Walk test

## 2019-03-08 NOTE — Progress Notes (Signed)
Late Entry for 03/07/19 @ 2132: Patient seen and assessed. Physical assessment completed via computerized charting per [facility] policy. No acute distress noted. [place]wick noted during assessment to wall suction draining clear,yellow urine. Side rails up times 2. Bed in lowest position and locked. Call light in reach. Continue to monitor

## 2019-03-08 NOTE — Progress Notes (Signed)
CRITICAL VALUE ALERT  Critical Value:  CBG 514  Date & Time Notied:  03/08/2019 1140 Provider Notified: Elgergawy MD  Orders Received/Actions taken: No new orders received at this time

## 2019-03-08 NOTE — Assessment & Plan Note (Signed)
Managed by cardiology, compliant with CPAP

## 2019-03-09 LAB — BASIC METABOLIC PANEL
Anion gap: 13 (ref 5–15)
BUN: 90 mg/dL — ABNORMAL HIGH (ref 8–23)
CO2: 23 mmol/L (ref 22–32)
Calcium: 9.8 mg/dL (ref 8.9–10.3)
Chloride: 97 mmol/L — ABNORMAL LOW (ref 98–111)
Creatinine, Ser: 1.8 mg/dL — ABNORMAL HIGH (ref 0.44–1.00)
GFR calc Af Amer: 33 mL/min — ABNORMAL LOW (ref >60)
GFR calc non Af Amer: 29 mL/min — ABNORMAL LOW (ref >60)
Glucose, Bld: 220 mg/dL — ABNORMAL HIGH (ref 70–99)
Potassium: 4.3 mmol/L (ref 3.5–5.1)
Sodium: 133 mmol/L — ABNORMAL LOW (ref 135–145)

## 2019-03-09 LAB — GLUCOSE, CAPILLARY
Glucose-Capillary: >600 mg/dL (ref 70–99)
Glucose-Capillary: 235 mg/dL — ABNORMAL HIGH (ref 70–99)
Glucose-Capillary: 454 mg/dL — ABNORMAL HIGH (ref 70–99)
Glucose-Capillary: 458 mg/dL — ABNORMAL HIGH (ref 70–99)
Glucose-Capillary: 286 mg/dL — ABNORMAL HIGH (ref 70–99)
Glucose-Capillary: 117 mg/dL — ABNORMAL HIGH (ref 70–99)

## 2019-03-09 LAB — ANCA TITERS
Atypical P-ANCA titer: <1:20 {titer}
C-ANCA: <1:20 {titer}
P-ANCA: <1:20 {titer}

## 2019-03-09 MED ORDER — INSULIN ASPART 100 UNIT/ML ~~LOC~~ SOLN
40.0000 [IU] | Freq: Once | SUBCUTANEOUS | Status: AC
  Administered 2019-03-09: 40 [IU] via SUBCUTANEOUS

## 2019-03-09 MED ORDER — POTASSIUM CHLORIDE CRYS ER 20 MEQ PO TBCR: 20.0000 meq | EXTENDED_RELEASE_TABLET | Freq: Once | ORAL | Status: DC

## 2019-03-09 NOTE — Care Management Important Message (Signed)
Important Message  Patient Details  Name: Kayla Brown MRN: [id number] Date of Birth: [birth year]   Medicare Important Message Given:  Yes     Marshell Dilauro [address] 03/09/2019, 10:53 AM

## 2019-03-09 NOTE — Progress Notes (Signed)
Pt states she cannot tolerate the full face mask. Pt only wears 02 by [place] during the night. Will continue to monitor.

## 2019-03-09 NOTE — Progress Notes (Signed)
Physical Therapy Treatment Patient Details Name: Kayla Brown MRN: [id number] DOB: 07/01/1950 Today's Date: 03/09/2019    History of Present Illness 68 y.o. female with medical history significant of hypertension, pulmonary hypertension, diabetes, morbid obesity, obstructive sleep apnea presenting 03/03/19 to the hospital for evaluation of shortness of breath and hypertension.  Patient states her blood pressure has been high for very long time with SBP 220 in ED. Chest x-ray showing cardiac enlargement and mild pulmonary vascular congestion. Findings thought to reflect early CHF. V/Q negative. COVID-19 negative. CBGs as high as 600.     PT Comments    Pt was seen for mobility and note her balance with rollator is more controlled, as well as demonstrating better static standing balance.  Pt was able to maneuver walker without PT, who instead managed her O2.  Pt is now able to walk with O2 sats no lower than 98% but pulses were up during the walk as high as 124.  Very brief increase that began to decline as pt sat down in her room.  Follow acutely for this work including LE strengthening as tolerated.   Follow Up Recommendations  Home health PT;Supervision - Intermittent     Equipment Recommendations  None recommended by PT    Recommendations for Other Services OT consult     Precautions / Restrictions Precautions Precautions: Other (comment) Precaution Comments: monitor O2 and pulses Restrictions Weight Bearing Restrictions: No    Mobility  Bed Mobility               General bed mobility comments: up in chair when PT arrived  Transfers Overall transfer level: Needs assistance Equipment used: Rolling walker (2 wheeled) Transfers: Sit to/from Stand Sit to Stand: Min guard(using chair armrests)         General transfer comment: PT assisting with telemetry and O2 lines  Ambulation/Gait Ambulation/Gait assistance: Min guard Gait Distance (Feet): 180 Feet Assistive  device: 4-wheeled walker Gait Pattern/deviations: Step-through pattern;Decreased stride length;Wide base of support Gait velocity: controlled Gait velocity interpretation: <1.31 ft/sec, indicative of household ambulator General Gait Details: pt was able to entirely control the walker, PT managed O2 only   Stairs             Wheelchair Mobility    Modified Rankin (Stroke Patients Only)       Balance Overall balance assessment: Needs assistance Sitting-balance support: Feet supported Sitting balance-Leahy Scale: Good     Standing balance support: Bilateral upper extremity supported;Single extremity supported;During functional activity Standing balance-Leahy Scale: Fair Standing balance comment: better control of standing balance today, able to stand statically with RW only                            Cognition Arousal/Alertness: Awake/alert Behavior During Therapy: WFL for tasks assessed/performed Overall Cognitive Status: Within Functional Limits for tasks assessed                                 General Comments: had lunch, able to tolerate gait and wants to walk longer trip today.  Has elevated BS today, was earlier nearly 500      Exercises      General Comments General comments (skin integrity, edema, etc.): Pt maintained O2 sats with O2, but pulses were up to 124      Pertinent Vitals/Pain Pain Assessment: Faces Pain Score: 0-No pain Faces Pain Scale: Hurts  a little bit Pain Location: generally feeling unwell, pt reports "stuffy" Pain Descriptors / Indicators: Discomfort Pain Intervention(s): Monitored during session;Limited activity within patient's tolerance    Home Living                      Prior Function            PT Goals (current goals can now be found in the care plan section) Acute Rehab PT Goals Patient Stated Goal: home and feel better Progress towards PT goals: Progressing toward goals     Frequency    Min 3X/week      PT Plan Current plan remains appropriate    Co-evaluation              AM-PAC PT "6 Clicks" Mobility   Outcome Measure  Help needed turning from your back to your side while in a flat bed without using bedrails?: None Help needed moving from lying on your back to sitting on the side of a flat bed without using bedrails?: None Help needed moving to and from a bed to a chair (including a wheelchair)?: A Little Help needed standing up from a chair using your arms (e.g., wheelchair or bedside chair)?: A Little Help needed to walk in hospital room?: A Little Help needed climbing 3-5 steps with a railing? : A Lot 6 Click Score: 19    End of Session Equipment Utilized During Treatment: Gait belt;Oxygen Activity Tolerance: Patient tolerated treatment well;Treatment limited secondary to medical complications (Comment) Patient left: in chair;with call bell/phone within reach;with family/visitor present(husband arrived as PT was initiating a walk) Nurse Communication: Mobility status PT Visit Diagnosis: Muscle weakness (generalized) (M62.81);Difficulty in walking, not elsewhere classified (R26.2)     Time: 7169-6789 PT Time Calculation (min) (ACUTE ONLY): 26 min  Charges:  $Gait Training: 8-22 mins $Therapeutic Activity: 8-22 mins               Ramond Dial 03/09/2019, 4:27 PM   Mee Hives, PT MS Acute Rehab Dept. Number: [place] [facility] [place]

## 2019-03-09 NOTE — Care Management Important Message (Signed)
Important Message  Patient Details  Name: Kayla Brown MRN: [id number] Date of Birth: 02/11/1951   Medicare Important Message Given:  Yes     [name] 03/09/2019, 3:58 PM

## 2019-03-09 NOTE — Progress Notes (Signed)
PROGRESS NOTE                                                                                                                                                                                                             Patient Demographics:    Kayla Brown, is a 68 y.o. female, DOB - 10-25-1950, ZOX:[id number]  Admit date - 03/03/2019   Admitting Physician John Giovanni, MD  Outpatient Primary MD for the patient is Sheliah Hatch, MD  LOS - 5   Chief Complaint  Patient presents with   Shortness of Breath   Hypertension       Brief Narrative    68 y.o. female with medical history significant of hypertension, pulmonary hypertension, diabetes, morbid obesity, obstructive sleep apnea presenting to the hospital for evaluation of shortness of breath and hypertension, work-up significant for hypoxia, volume overload, she was admitted for IV diuresis.  Patient with increased oxygen requirement 9/10, at 8 L nasal cannula, PCCM consulted, work-up significant for atelectasis and pulmonary edema.  Patient improved with IV diuresis, and pulmonary PT, renal function Fluctuating in the setting of diuresis.    Subjective:    Kayla Brown today reports some runny nose, and she has allergies, denies any fever or chills, no dyspnea or cough.   Assessment  & Plan :    Principal Problem:   Acute respiratory failure with hypoxia (HCC) Active Problems:   Essential hypertension, benign   OSA (obstructive sleep apnea)   Pulmonary edema   AKI (acute kidney injury) (HCC)  Acute hypoxic respiratory failure secondary to pulmonary edema - Secondary to volume overload, and atelectasis, patient with increased oxygen requirement to 8 L nasal cannula , being tapered, I have stopped her oxygen this morning where she remains good O2 sats in the mid 90s. -Pulmonary input greatly appreciated, this is most likely in the setting of acute pulmonary edema, and poor  inspiratory effort/atelectasis -Encouraged to his incentive spirometry -Fluid restriction, daily weight, strict ins and outs, has good response to diuresis yesterday, -2 L over last 24 hours, but creatinine increased to 1.8, so I will hold Lasix for today . -Was encouraged to get out of bed to chair, and keep using incentive spirometry. -VQ scan with no evidence of PE, heparin GTT has been stopped .  AKI on CKD 3 -Baseline creatinine 1.2,  significantly increased today at 1.8, this is most likely in the setting of diuresis, will hold Lasix for today.  Essential hypertension -Blood pressure  improved with amlodipine, hydralazine. -Hold home ACE inhibitor and diuretics given AKI  OSA -CPAP at night  Diabetes mellitus -A1c is 8  -Her CBG during hospital stay today is significantly uncontrolled, despite significant increase in her Lantus dose, she was resumed on her home dose of glipizide XL, currently on Lantus 25 units nightly, and 55 units every morning .   Code Status : Full  Family Communication  : D/W patient Disposition Plan  : Home  Barriers For Discharge : Remains on oxygen, hypervolemic, on IV diuresis  Consults  :  none  Procedures  : None  DVT Prophylaxis  :  [place] Heparin   Lab Results  Component Value Date   PLT 407 (H) 03/08/2019    Antibiotics  :    Anti-infectives (From admission, onward)   None        Objective:   Vitals:   03/09/19 0500 03/09/19 0629 03/09/19 0737 03/09/19 1120  BP:  (!) 134/58 139/60   Pulse:  98    Resp:      Temp:   99.6 F (37.6 C)   TempSrc:   Axillary   SpO2:  93%  95%  Weight: 95.3 kg     Height:        Wt Readings from Last 3 Encounters:  03/09/19 95.3 kg  02/16/19 102.9 kg  02/05/19 100.7 kg     Intake/Output Summary (Last 24 hours) at 03/09/2019 1124 Last data filed at 03/09/2019 0600 Gross per 24 hour  Intake --  Output 2000 ml  Net -2000 ml     Physical Exam  Awake Alert, Oriented X 3, No new F.N  deficits, Normal affect Symmetrical Chest wall movement, Good air movement bilaterally, CTAB RRR,No Gallops,Rubs or new Murmurs, No Parasternal Heave +ve B.Sounds, Abd Soft, No tenderness, No rebound - guarding or rigidity. No Cyanosis, Clubbing or edema, No new Rash or bruise      Data Review:    CBC Recent Labs  Lab 03/03/19 1717 03/04/19 0416 03/05/19 0353 03/06/19 0440 03/07/19 0512 03/08/19 0656  WBC 9.6 9.3 9.9 11.1* 12.3* 13.0*  HGB 10.7* 10.3* 11.1* 11.3* 12.4 12.1  HCT 33.7* 32.7* 33.9* 33.6* 37.3 35.2*  PLT 255 257 309 349 403* 407*  MCV 91.8 92.1 89.0 86.8 87.8 85.9  MCH 29.2 29.0 29.1 29.2 29.2 29.5  MCHC 31.8 31.5 32.7 33.6 33.2 34.4  RDW 14.7 14.6 14.4 14.3 14.0 14.0  LYMPHSABS 1.2  --   --   --   --   --   MONOABS 1.1*  --   --   --   --   --   EOSABS 0.7*  --   --   --   --   --   BASOSABS 0.0  --   --   --   --   --     Chemistries  Recent Labs  Lab 03/03/19 1717  03/05/19 0353 03/06/19 0440 03/07/19 0512 03/08/19 0656 03/09/19 0610  NA 137   < > 133* 135 134* 132* 133*  K 4.7   < > 4.6 3.9 4.2 3.9 4.3  CL 108   < > 104 101 102 98 97*  CO2 18*   < > 18* 21* 20* 23 23  GLUCOSE 357*   < > 374* 266* 273* 319* 220*  BUN 53*   < >  47* 51* 65* 75* 90*  CREATININE 1.79*   < > 1.45* 1.54* 1.47* 1.48* 1.80*  CALCIUM 9.4   < > 9.4 9.4 9.6 9.6 9.8  AST 20  --   --   --   --   --   --   ALT 38  --   --   --   --   --   --   ALKPHOS 56  --   --   --   --   --   --   BILITOT 0.3  --   --   --   --   --   --    < > = values in this interval not displayed.   ------------------------------------------------------------------------------------------------------------------ No results for input(s): CHOL, HDL, LDLCALC, TRIG, CHOLHDL, LDLDIRECT in the last 72 hours.  Lab Results  Component Value Date   HGBA1C 8.0 (H) 03/06/2019   ------------------------------------------------------------------------------------------------------------------ No results for  input(s): TSH, T4TOTAL, T3FREE, THYROIDAB in the last 72 hours.  Invalid input(s): FREET3 ------------------------------------------------------------------------------------------------------------------ No results for input(s): VITAMINB12, FOLATE, FERRITIN, TIBC, IRON, RETICCTPCT in the last 72 hours.  Coagulation profile No results for input(s): INR, PROTIME in the last 168 hours.  No results for input(s): DDIMER in the last 72 hours.  Cardiac Enzymes No results for input(s): CKMB, TROPONINI, MYOGLOBIN in the last 168 hours.  Invalid input(s): CK ------------------------------------------------------------------------------------------------------------------    Component Value Date/Time   BNP 50.8 03/03/2019 2357   BNP 25.5 04/08/2015 0753    Inpatient Medications  Scheduled Meds:  amLODipine  10 mg Oral Daily   atorvastatin  40 mg Oral q1800   chlorthalidone  25 mg Oral Daily   cholecalciferol  2,000 Units Oral Daily   fluticasone  2 spray Each Nare Daily   glipiZIDE  10 mg Oral Q breakfast   heparin injection (subcutaneous)  5,000 Units Subcutaneous Q8H   hydrALAZINE  25 mg Oral Q6H   insulin aspart  0-15 Units Subcutaneous TID WC   insulin aspart  0-5 Units Subcutaneous QHS   insulin aspart  6 Units Subcutaneous TID WC   insulin glargine  25 Units Subcutaneous QHS   insulin glargine  55 Units Subcutaneous Daily   loratadine  10 mg Oral Daily   Melatonin  3 mg Oral QHS   multivitamin  1 tablet Oral Daily   omega-3 acid ethyl esters  1 g Oral Daily   sertraline  50 mg Oral Daily   umeclidinium-vilanterol  1 puff Inhalation Daily   Continuous Infusions:  PRN Meds:.acetaminophen **OR** acetaminophen, hydrALAZINE, ondansetron (ZOFRAN) IV, simethicone  Micro Results Recent Results (from the past 240 hour(s))  SARS Coronavirus 2 [facility](Hospital order, Performed [facility]) Nasopharyngeal Nasopharyngeal Swab     Status: None    Collection Time: 03/04/19 12:01 AM   Specimen: Nasopharyngeal Swab  Result Value Ref Range Status   SARS Coronavirus 2 NEGATIVE NEGATIVE Final    Comment: (NOTE) If result is NEGATIVE SARS-CoV-2 target nucleic acids are NOT DETECTED. The SARS-CoV-2 RNA is generally detectable in upper and lower  respiratory specimens during the acute phase of infection. The lowest  concentration of SARS-CoV-2 viral copies this assay can detect is 250  copies / mL. A negative result does not preclude SARS-CoV-2 infection  and should not be used as the sole basis for treatment or other  patient management decisions.  A negative result may occur with  improper specimen collection / handling, submission of specimen other  than nasopharyngeal swab,  presence of viral mutation(s) within the  areas targeted by this assay, and inadequate number of viral copies  (<250 copies / mL). A negative result must be combined with clinical  observations, patient history, and epidemiological information. If result is POSITIVE SARS-CoV-2 target nucleic acids are DETECTED. The SARS-CoV-2 RNA is generally detectable in upper and lower  respiratory specimens dur ing the acute phase of infection.  Positive  results are indicative of active infection with SARS-CoV-2.  Clinical  correlation with patient history and other diagnostic information is  necessary to determine patient infection status.  Positive results do  not rule out bacterial infection or co-infection with other viruses. If result is PRESUMPTIVE POSTIVE SARS-CoV-2 nucleic acids MAY BE PRESENT.   A presumptive positive result was obtained on the submitted specimen  and confirmed on repeat testing.  While 2019 novel coronavirus  (SARS-CoV-2) nucleic acids may be present in the submitted sample  additional confirmatory testing may be necessary for epidemiological  and / or clinical management purposes  to differentiate between  SARS-CoV-2 and other Sarbecovirus  currently known to infect humans.  If clinically indicated additional testing with an alternate test  methodology [phone]) is advised. The SARS-CoV-2 RNA is generally  detectable in upper and lower respiratory sp ecimens during the acute  phase of infection. The expected result is Negative. Fact Sheet for Patients:  StrictlyIdeas.no Fact Sheet for Healthcare Providers: BankingDealers.co.za This test is not yet approved or cleared by the [place] FDA and has been authorized for detection and/or diagnosis of SARS-CoV-2 by FDA under an Emergency Use Authorization (EUA).  This EUA will remain in effect (meaning this test can be used) for the duration of the COVID-19 declaration under Section 564(b)(1) of the Act, 21 U.S.C. section 360bbb-3(b)(1), unless the authorization is terminated or revoked sooner. Performed at [facility], [place] [address], [place], [place] [address]   Urine Culture     Status: Abnormal   Collection Time: 03/04/19  8:47 AM   Specimen: Urine, Random  Result Value Ref Range Status   Specimen Description URINE, RANDOM  Final   Special Requests NONE  Final   Culture (A)  Final    <10,000 COLONIES/mL INSIGNIFICANT GROWTH Performed at [facility], [place] [address] [place], [place] [address]    Report Status 03/05/2019 FINAL  Final  SARS Coronavirus 2 [facility] order, Performed in [facility] hospital lab) Nasopharyngeal Nasopharyngeal Swab     Status: None   Collection Time: 03/05/19  3:30 PM   Specimen: Nasopharyngeal Swab  Result Value Ref Range Status   SARS Coronavirus 2 NEGATIVE NEGATIVE Final    Comment: (NOTE) If result is NEGATIVE SARS-CoV-2 target nucleic acids are NOT DETECTED. The SARS-CoV-2 RNA is generally detectable in upper and lower  respiratory specimens during the acute phase of infection. The lowest  concentration of SARS-CoV-2 viral copies this assay can detect is 250  copies /  mL. A negative result does not preclude SARS-CoV-2 infection  and should not be used as the sole basis for treatment or other  patient management decisions.  A negative result may occur with  improper specimen collection / handling, submission of specimen other  than nasopharyngeal swab, presence of viral mutation(s) within the  areas targeted by this assay, and inadequate number of viral copies  (<250 copies / mL). A negative result must be combined with clinical  observations, patient history, and epidemiological information. If result is POSITIVE SARS-CoV-2 target nucleic acids are DETECTED.  The SARS-CoV-2 RNA is generally detectable in upper and lower  respiratory specimens dur ing the acute phase of infection.  Positive  results are indicative of active infection with SARS-CoV-2.  Clinical  correlation with patient history and other diagnostic information is  necessary to determine patient infection status.  Positive results do  not rule out bacterial infection or co-infection with other viruses. If result is PRESUMPTIVE POSTIVE SARS-CoV-2 nucleic acids MAY BE PRESENT.   A presumptive positive result was obtained on the submitted specimen  and confirmed on repeat testing.  While 2019 novel coronavirus  (SARS-CoV-2) nucleic acids may be present in the submitted sample  additional confirmatory testing may be necessary for epidemiological  and / or clinical management purposes  to differentiate between  SARS-CoV-2 and other Sarbecovirus currently known to infect humans.  If clinically indicated additional testing with an alternate test  methodology [phone]) is advised. The SARS-CoV-2 RNA is generally  detectable in upper and lower respiratory sp ecimens during the acute  phase of infection. The expected result is Negative. Fact Sheet for Patients:  BoilerBrush.com.cy Fact Sheet for Healthcare Providers: https://pope.com/ This test is  not yet approved or cleared by the [place] FDA and has been authorized for detection and/or diagnosis of SARS-CoV-2 by FDA under an Emergency Use Authorization (EUA).  This EUA will remain in effect (meaning this test can be used) for the duration of the COVID-19 declaration under Section 564(b)(1) of the Act, 21 U.S.C. section 360bbb-3(b)(1), unless the authorization is terminated or revoked sooner. Performed at [facility], [address], [place], [place] [address]   Respiratory Panel by PCR     Status: None   Collection Time: 03/05/19  6:25 PM   Specimen: Nasopharyngeal Swab; Respiratory  Result Value Ref Range Status   Adenovirus NOT DETECTED NOT DETECTED Final   Coronavirus 229E NOT DETECTED NOT DETECTED Final    Comment: (NOTE) The Coronavirus on the Respiratory Panel, DOES NOT test for the novel  Coronavirus (2019 nCoV)    Coronavirus HKU1 NOT DETECTED NOT DETECTED Final   Coronavirus NL63 NOT DETECTED NOT DETECTED Final   Coronavirus OC43 NOT DETECTED NOT DETECTED Final   Metapneumovirus NOT DETECTED NOT DETECTED Final   Rhinovirus / Enterovirus NOT DETECTED NOT DETECTED Final   Influenza A NOT DETECTED NOT DETECTED Final   Influenza B NOT DETECTED NOT DETECTED Final   Parainfluenza Virus 1 NOT DETECTED NOT DETECTED Final   Parainfluenza Virus 2 NOT DETECTED NOT DETECTED Final   Parainfluenza Virus 3 NOT DETECTED NOT DETECTED Final   Parainfluenza Virus 4 NOT DETECTED NOT DETECTED Final   Respiratory Syncytial Virus NOT DETECTED NOT DETECTED Final   Bordetella pertussis NOT DETECTED NOT DETECTED Final   Chlamydophila pneumoniae NOT DETECTED NOT DETECTED Final   Mycoplasma pneumoniae NOT DETECTED NOT DETECTED Final    Comment: Performed at [facility], [address], [place], [place] [address]    Radiology Reports Dg Chest 2 View  Result Date: 03/03/2019 CLINICAL DATA:  Pneumonia.  Short of breath. EXAM: CHEST - 2 VIEW COMPARISON:  02/17/2019  FINDINGS: Mild cardiac enlargement. Pulmonary vascular congestion identified. Lungs are hyperinflated and there are coarsened interstitial markings noted bilaterally. Pleural thickening along the major fissure is again noted. Scar or atelectasis noted in the right middle lobe. IMPRESSION: Cardiac enlargement and mild pulmonary vascular congestion with thickening along the major fissure of the right lung. Findings may reflect early CHF. No airspace consolidation identified. Electronically Signed  By: Signa Kellaylor  Stroud M.D.   On: 03/03/2019 17:55   Dg Chest 2 View  Result Date: 02/18/2019 CLINICAL DATA:  Dyspnea on exertion.  Pulmonary hypertension. EXAM: CHEST - 2 VIEW COMPARISON:  None. FINDINGS: Normal heart size. Normal mediastinal contour. No pneumothorax. No pleural effusion. Patchy right middle lobe opacity. No pulmonary edema. IMPRESSION: Patchy right middle lobe opacity suggests a pneumonia. Recommend follow-up PA and lateral post treatment chest radiographs in 4-6 weeks. Electronically Signed   By: Delbert PhenixJason A Poff M.D.   On: 02/18/2019 08:47   Ct Chest Wo Contrast  Result Date: 03/05/2019 CLINICAL DATA:  Acute hypoxemic respiratory failure EXAM: CT CHEST WITHOUT CONTRAST TECHNIQUE: Multidetector CT imaging of the chest was performed following the standard protocol without IV contrast. COMPARISON:  CT 04/05/2015 FINDINGS: Cardiovascular: Normal heart size. No pericardial effusion. Atherosclerotic calcification of the aortic leaflets is noted. There is mitral annular calcification is well. The aorta is normal caliber with atheromatous plaque. Additional calcifications are present in the proximal great vessels and upper abdominal aorta. Major venous structures are unremarkable. Mediastinum/Nodes: Numerous though nonenlarged mediastinal nodes are present. Evaluation for hilar adenopathy is limited in the absence of contrast. No axillary adenopathy. Thyroid gland and thoracic inlet are unremarkable. Bowing of  the posterior trachea 0 likely related to imaging during exhalation. There is mild airways thickening. The esophagus is unremarkable. Lungs/Pleura: Redemonstration of mosaic attenuation throughout the lungs superimposed on centrilobular and paraseptal emphysema. There is increasing bandlike areas of subsegmental atelectasis and/or scarring as well as additional atelectatic change posteriorly in the lung bases. No pneumothorax. No effusion. Stable subpleural area of scarring is seen in the right middle lobe. Upper Abdomen: Small accessory splenule. Mild bilateral symmetric perinephric stranding, a nonspecific finding though may correlate with either age or decreased renal function. Portion of the hepatic flexure is interposed anterior to the liver. Fatty replacement of the pancreas. No pancreatic ductal dilatation or surrounding inflammatory changes. No acute abnormalities present in the visualized portions of the upper abdomen. Musculoskeletal: Multilevel degenerative changes are present in the imaged portions of the spine. No acute osseous abnormality or suspicious osseous lesion. IMPRESSION: 1. Extensive atelectatic changes are present throughout the lungs with a dependent predominance. 2. Redemonstration of mosaic attenuation throughout the lungs superimposed on centrilobular and paraseptal emphysema, suggestive of small airways disease. 3. Mild bilateral symmetric perinephric stranding, a nonspecific finding though may correlate with either age or decreased renal function. 4. Aortic Atherosclerosis (ICD10-I70.0) and Emphysema (ICD10-J43.9). Electronically Signed   By: Kreg ShropshirePrice  DeHay M.D.   On: 03/05/2019 22:41   Nm Pulmonary Perf And Vent  Result Date: 03/04/2019 CLINICAL DATA:  Evaluate for pulmonary embolism. Short of breath with hypertension. EXAM: NUCLEAR MEDICINE VENTILATION - PERFUSION LUNG SCAN TECHNIQUE: Ventilation images were obtained in multiple projections using inhaled aerosol Tc-2961m DTPA.  Perfusion images were obtained in multiple projections after intravenous injection of Tc-3961m MAA. RADIOPHARMACEUTICALS:  32.7 mCi of Tc-7461m DTPA aerosol inhalation and 1.53 mCi Tc6661m MAA IV COMPARISON:  Chest radiograph 03/03/2019 FINDINGS: Ventilation: Large segmental ventilation defect identified involving the superior segment of left lower lobe. Medium to large segmental perfusion defect identified within the lateral left lower lobe. Perfusion: Corresponding perfusion abnormalities are noted within the superior segment of left lower lobe and lateral left lower lobe. IMPRESSION: No unmatched segmental perfusion defects identified to suggest acute pulmonary embolus. Electronically Signed   By: Signa Kellaylor  Stroud M.D.   On: 03/04/2019 15:54   Dg Chest Port 1 View  Result Date: 03/05/2019  CLINICAL DATA:  Hypoxia EXAM: PORTABLE CHEST 1 VIEW COMPARISON:  03/03/2019 FINDINGS: Cardiomegaly. Mild irregular scarring or atelectasis of the mid lungs bilaterally. No acute appearing airspace opacity. IMPRESSION: Cardiomegaly. Mild irregular scarring or atelectasis of the mid lungs bilaterally. No acute appearing airspace opacity. Electronically Signed   By: Lauralyn Primes M.D.   On: 03/05/2019 15:07     Huey Bienenstock M.D on 03/09/2019 at 11:24 AM  Between 7am to 7pm - Pager - [phone]  After 7pm go to [email or web address] - password [facility]  [facility] -  Office  [phone]

## 2019-03-09 NOTE — Progress Notes (Signed)
Occupational Therapy Treatment Patient Details Name: Kayla Brown MRN: [id number] DOB: 1950-07-14 Today's Date: 03/09/2019    History of present illness 68 y.o. female with medical history significant of hypertension, pulmonary hypertension, diabetes, morbid obesity, obstructive sleep apnea presenting 03/03/19 to the hospital for evaluation of shortness of breath and hypertension.  Patient states her blood pressure has been high for very long time with SBP 220 in ED. Chest x-ray showing cardiac enlargement and mild pulmonary vascular congestion. Findings thought to reflect early CHF. V/Q negative. COVID-19 negative. CBGs as high as 600.    OT comments  Pt presents seated in recliner, willing to work with therapy but reports feeling increased SOB today and feeling "stuffy". Pt completing short distance mobility in room using RW, requiring minA and min verbal cues for safe navigation/RW use. Pt completing grooming ADL tasks seated at sink today (vs standing) due to decreased activity tolerance. Continued review/education provided on EC technique for ADL tasks. Pt on 3L O2 during session with overall SpO2 maintaining >90%, max HR noted 121 with room level activity. Feel POC remains appropriate at this time. Will continue to follow acutely.   Follow Up Recommendations  Home health OT;Supervision/Assistance - 24 hour    Equipment Recommendations  None recommended by OT          Precautions / Restrictions Precautions Precautions: Other (comment) Precaution Comments: watch O2 Restrictions Weight Bearing Restrictions: No       Mobility Bed Mobility               General bed mobility comments: received OOB in recliner  Transfers Overall transfer level: Needs assistance Equipment used: Rolling walker (2 wheeled) Transfers: Sit to/from Stand Sit to Stand: Min guard         General transfer comment: for safety, lines and balance; VCs safe hand placement    Balance Overall  balance assessment: Needs assistance Sitting-balance support: Feet supported Sitting balance-Leahy Scale: Good     Standing balance support: Bilateral upper extremity supported;Single extremity supported;During functional activity Standing balance-Leahy Scale: Poor Standing balance comment: reliant on UE support                           ADL either performed or assessed with clinical judgement   ADL Overall ADL's : Needs assistance/impaired     Grooming: Set up;Min guard;Sitting;Wash/dry face;Oral care;Brushing hair Grooming Details (indicate cue type and reason): pt completing tasks seated at sink today vs standing due to reports of increased difficulty breathing and feeling "stuffy" (O2 sats stable)                             Functional mobility during ADLs: Minimal assistance;Rolling walker General ADL Comments: pt with decreased activity tolerance today, requesting breathing treatment and morning meds with RN notified                        Cognition Arousal/Alertness: Awake/alert Behavior During Therapy: WFL for tasks assessed/performed Overall Cognitive Status: Within Functional Limits for tasks assessed                                 General Comments: for basic tasks, pt does require increased cues for safety/safe RW use, overall not feeling well today        Exercises     Shoulder  Instructions       General Comments      Pertinent Vitals/ Pain       Pain Assessment: Faces Faces Pain Scale: Hurts a little bit Pain Location: generally feeling unwell, pt reports "stuffy" Pain Descriptors / Indicators: Discomfort Pain Intervention(s): Monitored during session;Limited activity within patient's tolerance  Home Living                                          Prior Functioning/Environment              Frequency  Min 2X/week        Progress Toward Goals  OT Goals(current goals can now be  found in the care plan section)  Progress towards OT goals: Progressing toward goals  Acute Rehab OT Goals Patient Stated Goal: go home and not need oxygen or walker; be able to continue to perform ADL/iADL OT Goal Formulation: With patient Time For Goal Achievement: 03/21/19 Potential to Achieve Goals: Good ADL Goals Pt Will Perform Grooming: with modified independence;standing Pt Will Perform Upper Body Dressing: with modified independence;sitting Pt Will Perform Lower Body Dressing: with modified independence;sit to/from stand Pt Will Transfer to Toilet: with modified independence;ambulating Pt Will Perform Toileting - Clothing Manipulation and hygiene: with modified independence;sit to/from stand Additional ADL Goal #1: Pt will independently verbalize/demonstrate at least 3 energy conservation techniques to utilize during functional tasks.  Plan Discharge plan remains appropriate    Co-evaluation                 AM-PAC OT "6 Clicks" Daily Activity     Outcome Measure   Help from another person eating meals?: None Help from another person taking care of personal grooming?: A Little Help from another person toileting, which includes using toliet, bedpan, or urinal?: A Little Help from another person bathing (including washing, rinsing, drying)?: A Little Help from another person to put on and taking off regular upper body clothing?: None Help from another person to put on and taking off regular lower body clothing?: A Little 6 Click Score: 20    End of Session Equipment Utilized During Treatment: Gait belt;Rolling walker;Oxygen  OT Visit Diagnosis: Unsteadiness on feet (R26.81);Muscle weakness (generalized) (M62.81);Other (comment)(decreased activity tolerance)   Activity Tolerance Patient tolerated treatment well;Patient limited by fatigue   Patient Left in chair;with call bell/phone within reach   Nurse Communication Mobility status        Time: [id number] OT  Time Calculation (min): 30 min  Charges: OT General Charges $OT Visit: 1 Visit OT Treatments $Self Care/Home Management : 23-37 mins  Kayla Brown, OT [facility] Services Pager [phone] Office [phone]    Kayla Brown 03/09/2019, 2:38 PM

## 2019-03-10 LAB — BASIC METABOLIC PANEL
Anion gap: 14 (ref 5–15)
BUN: 98 mg/dL — ABNORMAL HIGH (ref 8–23)
CO2: 23 mmol/L (ref 22–32)
Calcium: 9.7 mg/dL (ref 8.9–10.3)
Chloride: 96 mmol/L — ABNORMAL LOW (ref 98–111)
Creatinine, Ser: 1.67 mg/dL — ABNORMAL HIGH (ref 0.44–1.00)
GFR calc Af Amer: 37 mL/min — ABNORMAL LOW (ref >60)
GFR calc non Af Amer: 32 mL/min — ABNORMAL LOW (ref >60)
Glucose, Bld: 127 mg/dL — ABNORMAL HIGH (ref 70–99)
Potassium: 3.6 mmol/L (ref 3.5–5.1)
Sodium: 133 mmol/L — ABNORMAL LOW (ref 135–145)

## 2019-03-10 LAB — CBC
HCT: 37.4 % (ref 36.0–46.0)
Hemoglobin: 12.9 g/dL (ref 12.0–15.0)
MCH: 29.5 pg (ref 26.0–34.0)
MCHC: 34.5 g/dL (ref 30.0–36.0)
MCV: 85.4 fL (ref 80.0–100.0)
Platelets: 468 10*3/uL — ABNORMAL HIGH (ref 150–400)
RBC: 4.38 MIL/uL (ref 3.87–5.11)
RDW: 13.9 % (ref 11.5–15.5)
WBC: 15.6 10*3/uL — ABNORMAL HIGH (ref 4.0–10.5)
nRBC: 0 % (ref 0.0–0.2)

## 2019-03-10 LAB — GLUCOSE, CAPILLARY
Glucose-Capillary: 196 mg/dL — ABNORMAL HIGH (ref 70–99)
Glucose-Capillary: 389 mg/dL — ABNORMAL HIGH (ref 70–99)
Glucose-Capillary: 414 mg/dL — ABNORMAL HIGH (ref 70–99)
Glucose-Capillary: 185 mg/dL — ABNORMAL HIGH (ref 70–99)
Glucose-Capillary: 125 mg/dL — ABNORMAL HIGH (ref 70–99)

## 2019-03-10 MED ORDER — RAMIPRIL 10 MG PO CAPS
20.0000 mg | ORAL_CAPSULE | Freq: Every day | ORAL | Status: DC
  Administered 2019-03-10: 20 mg via ORAL
  Filled 2019-03-10 (×2): qty 2

## 2019-03-10 MED ORDER — INSULIN ASPART 100 UNIT/ML ~~LOC~~ SOLN
34.0000 [IU] | Freq: Once | SUBCUTANEOUS | Status: AC
  Administered 2019-03-10: 34 [IU] via SUBCUTANEOUS

## 2019-03-10 MED ORDER — INSULIN GLARGINE 100 UNIT/ML ~~LOC~~ SOLN
40.0000 [IU] | Freq: Every day | SUBCUTANEOUS | Status: DC
  Administered 2019-03-10 – 2019-03-11 (×2): 40 [IU] via SUBCUTANEOUS
  Filled 2019-03-10 (×3): qty 0.4

## 2019-03-10 MED ORDER — INSULIN GLARGINE 100 UNIT/ML ~~LOC~~ SOLN
65.0000 [IU] | Freq: Every day | SUBCUTANEOUS | Status: DC
  Administered 2019-03-11 – 2019-03-12 (×2): 65 [IU] via SUBCUTANEOUS
  Filled 2019-03-10 (×2): qty 0.65

## 2019-03-10 MED ORDER — SODIUM CHLORIDE 0.9 % IV BOLUS
500.0000 mL | Freq: Once | INTRAVENOUS | Status: AC
  Administered 2019-03-10: 500 mL via INTRAVENOUS

## 2019-03-10 MED ORDER — INSULIN GLARGINE 100 UNIT/ML ~~LOC~~ SOLN
15.0000 [IU] | SUBCUTANEOUS | Status: AC
  Administered 2019-03-10: 15 [IU] via SUBCUTANEOUS
  Filled 2019-03-10: qty 0.15

## 2019-03-10 MED ORDER — FUROSEMIDE 10 MG/ML IJ SOLN
40.0000 mg | Freq: Every day | INTRAMUSCULAR | Status: DC
  Administered 2019-03-10: 40 mg via INTRAVENOUS
  Filled 2019-03-10: qty 4

## 2019-03-10 NOTE — Progress Notes (Signed)
PROGRESS NOTE                                                                                                                                                                                                             Patient Demographics:    Kayla Brown, is a 68 y.o. female, DOB - Oct 21, 1950, WUJ:[id number]  Admit date - 03/03/2019   Admitting Physician John Giovanni, MD  Outpatient Primary MD for the patient is Sheliah Hatch, MD  LOS - 6   Chief Complaint  Patient presents with   Shortness of Breath   Hypertension       Brief Narrative    68 y.o. female with medical history significant of hypertension, pulmonary hypertension, diabetes, morbid obesity, obstructive sleep apnea presenting to the hospital for evaluation of shortness of breath and hypertension, work-up significant for hypoxia, volume overload, she was admitted for IV diuresis.  Patient with increased oxygen requirement 9/10, at 8 L nasal cannula, PCCM consulted, work-up significant for atelectasis and pulmonary edema.  Patient improved with IV diuresis, and pulmonary PT, renal function Fluctuating in the setting of diuresis.  Subjective:    Kayla Brown today has any fever or chills, she is on 3 L nasal cannula this morning, desaturated once try to wean .   Assessment  & Plan :    Principal Problem:   Acute respiratory failure with hypoxia (HCC) Active Problems:   Essential hypertension, benign   OSA (obstructive sleep apnea)   Pulmonary edema   AKI (acute kidney injury) (HCC)  Acute hypoxic respiratory failure secondary to pulmonary edema - Secondary to volume overload, and atelectasis, patient with increased oxygen requirement to 8 L nasal cannula one-point, but it is improving currently with diuresis, and chest PT, this morning she is on 3 L nasal cannula, I have weaned to room air, but she still dropping in the 88%, she is currently on 2 L nasal  cannula. -Pulmonary input greatly appreciated, this is most likely in the setting of acute pulmonary edema, and poor inspiratory effort/atelectasis -Encouraged to use incentive spirometry, ambulate in the hallway with staff, and out of bed to chair. -VQ scan with no evidence of PE, heparin GTT has been stopped .  Acute on chronic diastolic CHF -Repeat echo this admission present preserved EF, but impaired relaxation -Currently volume loaded on presentation,  on IV Lasix, has been held yesterday given increased creatinine to 1.8, improving today, she will be resumed back on 40 mg IV Lasix, strict ins and outs, monitor renal function closely.  AKI on CKD 3 -Baseline creatinine 1.2, significantly increased today at 1.8, it is improving now Lasix is on hold,   Essential hypertension -Blood pressure  improved with amlodipine, hydralazine. -Hold home ACE inhibitor and diuretics given AKI  OSA -CPAP at night  Diabetes mellitus -A1c is 8 , Outpatient Diabetes medications: Jardiance 10 mg daily, Glipizide XL 10 mg QAM, Metformin XR 1000 mg BID, Actos 30 mg -Overall her A1c showing good control, but her CBG extremely uncontrolled during hospital stay, running usually in the 400-500, despite significantly high Lantus dosing ( no insulin requirement as an outpatient ), I have increased her Lantus to 65 units daily, and 40 units nightly, 15 units before meals, add insulin sliding scale .    Code Status : Full  Family Communication  : D/W patient  Disposition Plan  : Home  Barriers For Discharge : Remains on oxygen, hypervolemic, on IV diuresis  Consults  :  PCCM  Procedures  : None  DVT Prophylaxis  :  [place] Heparin   Lab Results  Component Value Date   PLT 468 (H) 03/10/2019    Antibiotics  :    Anti-infectives (From admission, onward)   None        Objective:   Vitals:   03/10/19 0740 03/10/19 0912 03/10/19 0923 03/10/19 1200  BP: 137/60  (!) 134/54 130/64  Pulse: 91 99  (!)  102  Resp: 20 (!) 26  16  Temp: 97.6 F (36.4 C)   (!) 97.5 F (36.4 C)  TempSrc: Oral   Oral  SpO2: 96% 93%  94%  Weight:      Height:        Wt Readings from Last 3 Encounters:  03/10/19 95 kg  02/16/19 102.9 kg  02/05/19 100.7 kg     Intake/Output Summary (Last 24 hours) at 03/10/2019 1346 Last data filed at 03/10/2019 0935 Gross per 24 hour  Intake 360 ml  Output 600 ml  Net -240 ml     Physical Exam  Awake Alert, Oriented X 3, No new F.N deficits, Normal affect Symmetrical Chest wall movement, Good air movement bilaterally, CTAB RRR,No Gallops,Rubs or new Murmurs, No Parasternal Heave +ve B.Sounds, Abd Soft, No tenderness, No rebound - guarding or rigidity. No Cyanosis, Clubbing or edema, No new Rash or bruise       Data Review:    CBC Recent Labs  Lab 03/03/19 1717  03/05/19 0353 03/06/19 0440 03/07/19 0512 03/08/19 0656 03/10/19 0352  WBC 9.6   < > 9.9 11.1* 12.3* 13.0* 15.6*  HGB 10.7*   < > 11.1* 11.3* 12.4 12.1 12.9  HCT 33.7*   < > 33.9* 33.6* 37.3 35.2* 37.4  PLT 255   < > 309 349 403* 407* 468*  MCV 91.8   < > 89.0 86.8 87.8 85.9 85.4  MCH 29.2   < > 29.1 29.2 29.2 29.5 29.5  MCHC 31.8   < > 32.7 33.6 33.2 34.4 34.5  RDW 14.7   < > 14.4 14.3 14.0 14.0 13.9  LYMPHSABS 1.2  --   --   --   --   --   --   MONOABS 1.1*  --   --   --   --   --   --   EOSABS  0.7*  --   --   --   --   --   --   BASOSABS 0.0  --   --   --   --   --   --    < > = values in this interval not displayed.    Chemistries  Recent Labs  Lab 03/03/19 1717  03/06/19 0440 03/07/19 0512 03/08/19 0656 03/09/19 0610 03/10/19 0352  NA 137   < > 135 134* 132* 133* 133*  K 4.7   < > 3.9 4.2 3.9 4.3 3.6  CL 108   < > 101 102 98 97* 96*  CO2 18*   < > 21* 20* 23 23 23   GLUCOSE 357*   < > 266* 273* 319* 220* 127*  BUN 53*   < > 51* 65* 75* 90* 98*  CREATININE 1.79*   < > 1.54* 1.47* 1.48* 1.80* 1.67*  CALCIUM 9.4   < > 9.4 9.6 9.6 9.8 9.7  AST 20  --   --   --   --   --    --   ALT 38  --   --   --   --   --   --   ALKPHOS 56  --   --   --   --   --   --   BILITOT 0.3  --   --   --   --   --   --    < > = values in this interval not displayed.   ------------------------------------------------------------------------------------------------------------------ No results for input(s): CHOL, HDL, LDLCALC, TRIG, CHOLHDL, LDLDIRECT in the last 72 hours.  Lab Results  Component Value Date   HGBA1C 8.0 (H) 03/06/2019   ------------------------------------------------------------------------------------------------------------------ No results for input(s): TSH, T4TOTAL, T3FREE, THYROIDAB in the last 72 hours.  Invalid input(s): FREET3 ------------------------------------------------------------------------------------------------------------------ No results for input(s): VITAMINB12, FOLATE, FERRITIN, TIBC, IRON, RETICCTPCT in the last 72 hours.  Coagulation profile No results for input(s): INR, PROTIME in the last 168 hours.  No results for input(s): DDIMER in the last 72 hours.  Cardiac Enzymes No results for input(s): CKMB, TROPONINI, MYOGLOBIN in the last 168 hours.  Invalid input(s): CK ------------------------------------------------------------------------------------------------------------------    Component Value Date/Time   BNP 50.8 03/03/2019 2357   BNP 25.5 04/08/2015 0753    Inpatient Medications  Scheduled Meds:  amLODipine  10 mg Oral Daily   atorvastatin  40 mg Oral q1800   chlorthalidone  25 mg Oral Daily   cholecalciferol  2,000 Units Oral Daily   fluticasone  2 spray Each Nare Daily   furosemide  40 mg Intravenous Daily   glipiZIDE  10 mg Oral Q breakfast   heparin injection (subcutaneous)  5,000 Units Subcutaneous Q8H   hydrALAZINE  25 mg Oral Q6H   insulin aspart  0-15 Units Subcutaneous TID WC   insulin aspart  0-5 Units Subcutaneous QHS   insulin aspart  6 Units Subcutaneous TID WC   insulin glargine  15  Units Subcutaneous NOW   insulin glargine  40 Units Subcutaneous QHS   [START ON 03/11/2019] insulin glargine  65 Units Subcutaneous Daily   loratadine  10 mg Oral Daily   Melatonin  3 mg Oral QHS   multivitamin  1 tablet Oral Daily   omega-3 acid ethyl esters  1 g Oral Daily   ramipril  20 mg Oral Daily   sertraline  50 mg Oral Daily   umeclidinium-vilanterol  1 puff Inhalation Daily   Continuous  Infusions:  PRN Meds:.acetaminophen **OR** acetaminophen, hydrALAZINE, ondansetron (ZOFRAN) IV, simethicone  Micro Results Recent Results (from the past 240 hour(s))  SARS Coronavirus 2 [facility](Hospital order, Performed in [facility] hospital lab) Nasopharyngeal Nasopharyngeal Swab     Status: None   Collection Time: 03/04/19 12:01 AM   Specimen: Nasopharyngeal Swab  Result Value Ref Range Status   SARS Coronavirus 2 NEGATIVE NEGATIVE Final    Comment: (NOTE) If result is NEGATIVE SARS-CoV-2 target nucleic acids are NOT DETECTED. The SARS-CoV-2 RNA is generally detectable in upper and lower  respiratory specimens during the acute phase of infection. The lowest  concentration of SARS-CoV-2 viral copies this assay can detect is 250  copies / mL. A negative result does not preclude SARS-CoV-2 infection  and should not be used as the sole basis for treatment or other  patient management decisions.  A negative result may occur with  improper specimen collection / handling, submission of specimen other  than nasopharyngeal swab, presence of viral mutation(s) within the  areas targeted by this assay, and inadequate number of viral copies  (<250 copies / mL). A negative result must be combined with clinical  observations, patient history, and epidemiological information. If result is POSITIVE SARS-CoV-2 target nucleic acids are DETECTED. The SARS-CoV-2 RNA is generally detectable in upper and lower  respiratory specimens dur ing the acute phase of infection.  Positive  results are  indicative of active infection with SARS-CoV-2.  Clinical  correlation with patient history and other diagnostic information is  necessary to determine patient infection status.  Positive results do  not rule out bacterial infection or co-infection with other viruses. If result is PRESUMPTIVE POSTIVE SARS-CoV-2 nucleic acids MAY BE PRESENT.   A presumptive positive result was obtained on the submitted specimen  and confirmed on repeat testing.  While 2019 novel coronavirus  (SARS-CoV-2) nucleic acids may be present in the submitted sample  additional confirmatory testing may be necessary for epidemiological  and / or clinical management purposes  to differentiate between  SARS-CoV-2 and other Sarbecovirus currently known to infect humans.  If clinically indicated additional testing with an alternate test  methodology [phone]([id number]) is advised. The SARS-CoV-2 RNA is generally  detectable in upper and lower respiratory sp ecimens during the acute  phase of infection. The expected result is Negative. Fact Sheet for Patients:  BoilerBrush.com.cyhttps://www.fda.gov/media/136312/download Fact Sheet for Healthcare Providers: https://pope.com/https://www.fda.gov/media/136313/download This test is not yet approved or cleared by the [place] FDA and has been authorized for detection and/or diagnosis of SARS-CoV-2 by FDA under an Emergency Use Authorization (EUA).  This EUA will remain in effect (meaning this test can be used) for the duration of the COVID-19 declaration under Section 564(b)(1) of the Act, 21 U.S.C. section 360bbb-3(b)(1), unless the authorization is terminated or revoked sooner. Performed at [facility], [address] [place], [place] [address]   Urine Culture     Status: Abnormal   Collection Time: 03/04/19  8:47 AM   Specimen: Urine, Random  Result Value Ref Range Status   Specimen Description URINE, RANDOM  Final   Special Requests NONE  Final   Culture (A)  Final    <10,000 COLONIES/mL  INSIGNIFICANT GROWTH Performed at [facility], [address] [place], [place] [address]    Report Status 03/05/2019 FINAL  Final  SARS Coronavirus 2 [facility](Hospital order, Performed in [facility] hospital lab) Nasopharyngeal Nasopharyngeal Swab     Status: None   Collection Time: 03/05/19  3:30  PM   Specimen: Nasopharyngeal Swab  Result Value Ref Range Status   SARS Coronavirus 2 NEGATIVE NEGATIVE Final    Comment: (NOTE) If result is NEGATIVE SARS-CoV-2 target nucleic acids are NOT DETECTED. The SARS-CoV-2 RNA is generally detectable in upper and lower  respiratory specimens during the acute phase of infection. The lowest  concentration of SARS-CoV-2 viral copies this assay can detect is 250  copies / mL. A negative result does not preclude SARS-CoV-2 infection  and should not be used as the sole basis for treatment or other  patient management decisions.  A negative result may occur with  improper specimen collection / handling, submission of specimen other  than nasopharyngeal swab, presence of viral mutation(s) within the  areas targeted by this assay, and inadequate number of viral copies  (<250 copies / mL). A negative result must be combined with clinical  observations, patient history, and epidemiological information. If result is POSITIVE SARS-CoV-2 target nucleic acids are DETECTED. The SARS-CoV-2 RNA is generally detectable in upper and lower  respiratory specimens dur ing the acute phase of infection.  Positive  results are indicative of active infection with SARS-CoV-2.  Clinical  correlation with patient history and other diagnostic information is  necessary to determine patient infection status.  Positive results do  not rule out bacterial infection or co-infection with other viruses. If result is PRESUMPTIVE POSTIVE SARS-CoV-2 nucleic acids MAY BE PRESENT.   A presumptive positive result was obtained on the submitted specimen  and confirmed on repeat testing.   While 2019 novel coronavirus  (SARS-CoV-2) nucleic acids may be present in the submitted sample  additional confirmatory testing may be necessary for epidemiological  and / or clinical management purposes  to differentiate between  SARS-CoV-2 and other Sarbecovirus currently known to infect humans.  If clinically indicated additional testing with an alternate test  methodology [phone]) is advised. The SARS-CoV-2 RNA is generally  detectable in upper and lower respiratory sp ecimens during the acute  phase of infection. The expected result is Negative. Fact Sheet for Patients:  BoilerBrush.com.cy Fact Sheet for Healthcare Providers: https://pope.com/ This test is not yet approved or cleared by the [place] FDA and has been authorized for detection and/or diagnosis of SARS-CoV-2 by FDA under an Emergency Use Authorization (EUA).  This EUA will remain in effect (meaning this test can be used) for the duration of the COVID-19 declaration under Section 564(b)(1) of the Act, 21 U.S.C. section 360bbb-3(b)(1), unless the authorization is terminated or revoked sooner. Performed at [facility], [address], [place], [place] [address]   Respiratory Panel by PCR     Status: None   Collection Time: 03/05/19  6:25 PM   Specimen: Nasopharyngeal Swab; Respiratory  Result Value Ref Range Status   Adenovirus NOT DETECTED NOT DETECTED Final   Coronavirus 229E NOT DETECTED NOT DETECTED Final    Comment: (NOTE) The Coronavirus on the Respiratory Panel, DOES NOT test for the novel  Coronavirus (2019 nCoV)    Coronavirus HKU1 NOT DETECTED NOT DETECTED Final   Coronavirus NL63 NOT DETECTED NOT DETECTED Final   Coronavirus OC43 NOT DETECTED NOT DETECTED Final   Metapneumovirus NOT DETECTED NOT DETECTED Final   Rhinovirus / Enterovirus NOT DETECTED NOT DETECTED Final   Influenza A NOT DETECTED NOT DETECTED Final   Influenza B NOT DETECTED  NOT DETECTED Final   Parainfluenza Virus 1 NOT DETECTED NOT DETECTED Final   Parainfluenza Virus 2 NOT DETECTED NOT DETECTED Final  Parainfluenza Virus 3 NOT DETECTED NOT DETECTED Final   Parainfluenza Virus 4 NOT DETECTED NOT DETECTED Final   Respiratory Syncytial Virus NOT DETECTED NOT DETECTED Final   Bordetella pertussis NOT DETECTED NOT DETECTED Final   Chlamydophila pneumoniae NOT DETECTED NOT DETECTED Final   Mycoplasma pneumoniae NOT DETECTED NOT DETECTED Final    Comment: Performed at [facility], [address] [place], [place] [address]    Radiology Reports Dg Chest 2 View  Result Date: 03/03/2019 CLINICAL DATA:  Pneumonia.  Short of breath. EXAM: CHEST - 2 VIEW COMPARISON:  02/17/2019 FINDINGS: Mild cardiac enlargement. Pulmonary vascular congestion identified. Lungs are hyperinflated and there are coarsened interstitial markings noted bilaterally. Pleural thickening along the major fissure is again noted. Scar or atelectasis noted in the right middle lobe. IMPRESSION: Cardiac enlargement and mild pulmonary vascular congestion with thickening along the major fissure of the right lung. Findings may reflect early CHF. No airspace consolidation identified. Electronically Signed   By: Signa Kell M.D.   On: 03/03/2019 17:55   Dg Chest 2 View  Result Date: 02/18/2019 CLINICAL DATA:  Dyspnea on exertion.  Pulmonary hypertension. EXAM: CHEST - 2 VIEW COMPARISON:  None. FINDINGS: Normal heart size. Normal mediastinal contour. No pneumothorax. No pleural effusion. Patchy right middle lobe opacity. No pulmonary edema. IMPRESSION: Patchy right middle lobe opacity suggests a pneumonia. Recommend follow-up PA and lateral post treatment chest radiographs in 4-6 weeks. Electronically Signed   By: Delbert Phenix M.D.   On: 02/18/2019 08:47   Ct Chest Wo Contrast  Result Date: 03/05/2019 CLINICAL DATA:  Acute hypoxemic respiratory failure EXAM: CT CHEST WITHOUT CONTRAST TECHNIQUE:  Multidetector CT imaging of the chest was performed following the standard protocol without IV contrast. COMPARISON:  CT 04/05/2015 FINDINGS: Cardiovascular: Normal heart size. No pericardial effusion. Atherosclerotic calcification of the aortic leaflets is noted. There is mitral annular calcification is well. The aorta is normal caliber with atheromatous plaque. Additional calcifications are present in the proximal great vessels and upper abdominal aorta. Major venous structures are unremarkable. Mediastinum/Nodes: Numerous though nonenlarged mediastinal nodes are present. Evaluation for hilar adenopathy is limited in the absence of contrast. No axillary adenopathy. Thyroid gland and thoracic inlet are unremarkable. Bowing of the posterior trachea 0 likely related to imaging during exhalation. There is mild airways thickening. The esophagus is unremarkable. Lungs/Pleura: Redemonstration of mosaic attenuation throughout the lungs superimposed on centrilobular and paraseptal emphysema. There is increasing bandlike areas of subsegmental atelectasis and/or scarring as well as additional atelectatic change posteriorly in the lung bases. No pneumothorax. No effusion. Stable subpleural area of scarring is seen in the right middle lobe. Upper Abdomen: Small accessory splenule. Mild bilateral symmetric perinephric stranding, a nonspecific finding though may correlate with either age or decreased renal function. Portion of the hepatic flexure is interposed anterior to the liver. Fatty replacement of the pancreas. No pancreatic ductal dilatation or surrounding inflammatory changes. No acute abnormalities present in the visualized portions of the upper abdomen. Musculoskeletal: Multilevel degenerative changes are present in the imaged portions of the spine. No acute osseous abnormality or suspicious osseous lesion. IMPRESSION: 1. Extensive atelectatic changes are present throughout the lungs with a dependent predominance. 2.  Redemonstration of mosaic attenuation throughout the lungs superimposed on centrilobular and paraseptal emphysema, suggestive of small airways disease. 3. Mild bilateral symmetric perinephric stranding, a nonspecific finding though may correlate with either age or decreased renal function. 4. Aortic Atherosclerosis (ICD10-I70.0) and Emphysema (ICD10-J43.9). Electronically Signed   By: Samuella Cota  [facility] M.D.   On: 03/05/2019 22:41   Nm Pulmonary Perf And Vent  Result Date: 03/04/2019 CLINICAL DATA:  Evaluate for pulmonary embolism. Short of breath with hypertension. EXAM: NUCLEAR MEDICINE VENTILATION - PERFUSION LUNG SCAN TECHNIQUE: Ventilation images were obtained in multiple projections using inhaled aerosol Tc-41m DTPA. Perfusion images were obtained in multiple projections after intravenous injection of Tc-54m MAA. RADIOPHARMACEUTICALS:  32.7 mCi of Tc-35m DTPA aerosol inhalation and 1.53 mCi Tc49m MAA IV COMPARISON:  Chest radiograph 03/03/2019 FINDINGS: Ventilation: Large segmental ventilation defect identified involving the superior segment of left lower lobe. Medium to large segmental perfusion defect identified within the lateral left lower lobe. Perfusion: Corresponding perfusion abnormalities are noted within the superior segment of left lower lobe and lateral left lower lobe. IMPRESSION: No unmatched segmental perfusion defects identified to suggest acute pulmonary embolus. Electronically Signed   By: Signa Kell M.D.   On: 03/04/2019 15:54   Dg Chest Port 1 View  Result Date: 03/05/2019 CLINICAL DATA:  Hypoxia EXAM: PORTABLE CHEST 1 VIEW COMPARISON:  03/03/2019 FINDINGS: Cardiomegaly. Mild irregular scarring or atelectasis of the mid lungs bilaterally. No acute appearing airspace opacity. IMPRESSION: Cardiomegaly. Mild irregular scarring or atelectasis of the mid lungs bilaterally. No acute appearing airspace opacity. Electronically Signed   By: Lauralyn Primes M.D.   On: 03/05/2019 15:07      Huey Bienenstock M.D on 03/10/2019 at 1:46 PM  Between 7am to 7pm - Pager - [phone]  After 7pm go to [email or web address] - password [facility]  [facility] -  Office  [phone]

## 2019-03-10 NOTE — Progress Notes (Signed)
Inpatient Diabetes Program Recommendations  AACE/ADA: New Consensus Statement on Inpatient Glycemic Control   Target Ranges:  Prepandial:   less than 140 mg/dL      Peak postprandial:   less than 180 mg/dL (1-2 hours)      Critically ill patients:  140 - 180 mg/dL   Results for TAELYR, JANTZ (MRN [id number]) as of 03/10/2019 12:09  Ref. Range 03/09/2019 07:39 03/09/2019 11:46 03/09/2019 14:48 03/09/2019 17:28 03/09/2019 21:32 03/10/2019 07:38 03/10/2019 10:35  Glucose-Capillary Latest Ref Range: 70 - 99 mg/dL 235 (H) 454 (H)  Novolog 46 units  Glipizide 10 mg   Lantus 55 units 458 (H) 286 (H)  Novolog 14 units  117 (H)      Lantus 25 units 196 (H)  Novolog 9 units  Glipizide 10 mg 389 (H)  Novolog 40 units @12 :30   Lantus 55 units  Results for TALEISHA, KACZYNSKI (MRN [id number]) as of 03/10/2019 12:09  Ref. Range 02/06/2019 13:40 03/04/2019 05:00 03/06/2019 04:40  Hemoglobin A1C Latest Ref Range: 4.8 - 5.6 % 8.1 (H) 7.8 (H) 8.0 (H)   Review of Glycemic Control  Diabetes history: DM2 Outpatient Diabetes medications: Jardiance 10 mg daily, Glipizide XL 10 mg QAM, Metformin XR 1000 mg BID, Actos 30 mg Current orders for Inpatient glycemic control: Lantus 55 units QAM, Lantus 25 units QHS, Novolog 6 units TID with meals, Novolog 0-15 units TID with meals, Novolog 0-5 units QHs, Glipizide XL 10 mg QAM  Inpatient Diabetes Program Recommendations:   Insulin-Meal Coverage: Please consider increasing meal coverage to Novolog 10 units TID with meals.  NOTE: In reviewing the chart, NO Novolog correction and meal coverage was given yesterday morning at 8:00 am so CBG up to 454 mg/dl at 11:46 on 03/09/19. Also noted that CBG of 389 mg/dl was checked at 10:35 am today and Novolog 40 units was given at 12:30 today.  NURSING: Please be sure that Novolog correction insulin is given within 60 minutes of obtaining CBG.  Thanks, Barnie Alderman, RN, MSN, CDE Diabetes Coordinator Inpatient Diabetes  Program [phone] (Team Pager from 8am to 5pm)

## 2019-03-11 ENCOUNTER — Inpatient Hospital Stay (HOSPITAL_COMMUNITY): Payer: Medicare Other

## 2019-03-11 LAB — GLUCOSE, CAPILLARY
Glucose-Capillary: 151 mg/dL — ABNORMAL HIGH (ref 70–99)
Glucose-Capillary: 298 mg/dL — ABNORMAL HIGH (ref 70–99)
Glucose-Capillary: 255 mg/dL — ABNORMAL HIGH (ref 70–99)
Glucose-Capillary: 157 mg/dL — ABNORMAL HIGH (ref 70–99)

## 2019-03-11 LAB — CBC
HCT: 36 % (ref 36.0–46.0)
Hemoglobin: 11.9 g/dL — ABNORMAL LOW (ref 12.0–15.0)
MCH: 29.2 pg (ref 26.0–34.0)
MCHC: 33.1 g/dL (ref 30.0–36.0)
MCV: 88.2 fL (ref 80.0–100.0)
Platelets: 495 10*3/uL — ABNORMAL HIGH (ref 150–400)
RBC: 4.08 MIL/uL (ref 3.87–5.11)
RDW: 14.3 % (ref 11.5–15.5)
WBC: 15.7 10*3/uL — ABNORMAL HIGH (ref 4.0–10.5)
nRBC: 0 % (ref 0.0–0.2)

## 2019-03-11 LAB — BASIC METABOLIC PANEL
Anion gap: 12 (ref 5–15)
BUN: 107 mg/dL — ABNORMAL HIGH (ref 8–23)
CO2: 22 mmol/L (ref 22–32)
Calcium: 9.2 mg/dL (ref 8.9–10.3)
Chloride: 97 mmol/L — ABNORMAL LOW (ref 98–111)
Creatinine, Ser: 2.32 mg/dL — ABNORMAL HIGH (ref 0.44–1.00)
GFR calc Af Amer: 25 mL/min — ABNORMAL LOW (ref >60)
GFR calc non Af Amer: 21 mL/min — ABNORMAL LOW (ref >60)
Glucose, Bld: 173 mg/dL — ABNORMAL HIGH (ref 70–99)
Potassium: 3.7 mmol/L (ref 3.5–5.1)
Sodium: 131 mmol/L — ABNORMAL LOW (ref 135–145)

## 2019-03-11 MED ORDER — IPRATROPIUM-ALBUTEROL 0.5-2.5 (3) MG/3ML IN SOLN
RESPIRATORY_TRACT | Status: AC
  Administered 2019-03-11: 3 mL via RESPIRATORY_TRACT
  Filled 2019-03-11: qty 3

## 2019-03-11 MED ORDER — IPRATROPIUM-ALBUTEROL 0.5-2.5 (3) MG/3ML IN SOLN
3.0000 mL | Freq: Four times a day (QID) | RESPIRATORY_TRACT | Status: DC | PRN
  Administered 2019-03-11: 12:00:00 3 mL via RESPIRATORY_TRACT

## 2019-03-11 MED ORDER — SODIUM CHLORIDE 0.9 % IV SOLN
INTRAVENOUS | Status: AC
  Administered 2019-03-11: 09:00:00 via INTRAVENOUS

## 2019-03-11 NOTE — Progress Notes (Signed)
Physical Therapy Treatment Patient Details Name: Kayla MirzaVickie Brown MRN: [id number] DOB: 10/14/1950 Today's Date: 03/11/2019    History of Present Illness 68 y.o. female with medical history significant of hypertension, pulmonary hypertension, diabetes, morbid obesity, obstructive sleep apnea presenting 03/03/19 to the hospital for evaluation of shortness of breath and hypertension.  Patient states her blood pressure has been high for very long time with SBP 220 in ED. Chest x-ray showing cardiac enlargement and mild pulmonary vascular congestion. Findings thought to reflect early CHF. V/Q negative. COVID-19 negative. CBGs as high as 600.     PT Comments    Pt was seen for mobility and strengthening, note that her light headedness was a factor while pulses were more controlled with gait today.  Messaged MD who is reporting her need for IV fluids, in place today to resolve this.  Follow acutely for these needs, ROM and strengthening and progression of gait with monitoring of her tolerance for all with vitals on room air.  Followed up with DC planning to address pt questions about services at the time of DC.  Follow Up Recommendations  Home health PT;Supervision - Intermittent     Equipment Recommendations  None recommended by PT    Recommendations for Other Services OT consult     Precautions / Restrictions Precautions Precaution Comments: monitor O2 and pulses Restrictions Weight Bearing Restrictions: No    Mobility  Bed Mobility               General bed mobility comments: up in chair  Transfers Overall transfer level: Needs assistance Equipment used: 4-wheeled walker;1 person hand held assist Transfers: Sit to/from Stand Sit to Stand: Min guard         General transfer comment: PT assisting with telemetry  Ambulation/Gait Ambulation/Gait assistance: Min guard Gait Distance (Feet): 100 Feet(40+50) Assistive device: 4-wheeled walker Gait Pattern/deviations:  Step-through pattern;Decreased stride length;Wide base of support Gait velocity: controlled Gait velocity interpretation: <1.31 ft/sec, indicative of household ambulator General Gait Details: pt was not feeling well, had concerning expression and was stating her head felt funny, sats were 97% and pulse 111   Stairs             Wheelchair Mobility    Modified Rankin (Stroke Patients Only)       Balance     Sitting balance-Leahy Scale: Good     Standing balance support: Bilateral upper extremity supported;During functional activity Standing balance-Leahy Scale: Fair                              Cognition Arousal/Alertness: Awake/alert Behavior During Therapy: WFL for tasks assessed/performed Overall Cognitive Status: Within Functional Limits for tasks assessed                                 General Comments: was up in chair and talking about not feeling well with standing      Exercises General Exercises - Lower Extremity Ankle Circles/Pumps: AROM;AAROM;Both;5 reps Quad Sets: AROM;Both;5 reps Hip ABduction/ADduction: AAROM;Both;10 reps    General Comments General comments (skin integrity, edema, etc.): pulses were lower today than previously but not feeling good with mild dizziness, and per CNA has been having low blood sugars      Pertinent Vitals/Pain Pain Assessment: Faces Faces Pain Scale: No hurt    Home Living  Prior Function            PT Goals (current goals can now be found in the care plan section) Acute Rehab PT Goals Patient Stated Goal: home and feel better Progress towards PT goals: Progressing toward goals    Frequency    Min 3X/week      PT Plan Current plan remains appropriate    Co-evaluation              AM-PAC PT "6 Clicks" Mobility   Outcome Measure  Help needed turning from your back to your side while in a flat bed without using bedrails?: None Help  needed moving from lying on your back to sitting on the side of a flat bed without using bedrails?: None Help needed moving to and from a bed to a chair (including a wheelchair)?: A Little Help needed standing up from a chair using your arms (e.g., wheelchair or bedside chair)?: A Little Help needed to walk in hospital room?: A Little Help needed climbing 3-5 steps with a railing? : A Lot 6 Click Score: 19    End of Session Equipment Utilized During Treatment: Gait belt Activity Tolerance: Treatment limited secondary to medical complications (Comment)(mild dizziness) Patient left: in chair;with call bell/phone within reach   PT Visit Diagnosis: Muscle weakness (generalized) (M62.81);Difficulty in walking, not elsewhere classified (R26.2)     Time: 0175-1025 PT Time Calculation (min) (ACUTE ONLY): 33 min  Charges:  $Gait Training: 8-22 mins $Therapeutic Exercise: 8-22 mins                    Ramond Dial 03/11/2019, 12:18 PM   Mee Hives, PT MS Acute Rehab Dept. Number: [place] and [place]

## 2019-03-11 NOTE — Progress Notes (Signed)
PROGRESS NOTE                                                                                                                                                                                                             Patient Demographics:    Kayla Brown, is a 68 y.o. female, DOB - 08/10/1950, GEX:[id number]  Admit date - 03/03/2019   Admitting Physician John GiovanniVasundhra Rathore, MD  Outpatient Primary MD for the patient is Sheliah Hatchabori, Katherine E, MD  LOS - 7   Chief Complaint  Patient presents with   Shortness of Breath   Hypertension       Brief Narrative    68 y.o. female with medical history significant of hypertension, pulmonary hypertension, diabetes, morbid obesity, obstructive sleep apnea presenting to the hospital for evaluation of shortness of breath and hypertension, work-up significant for hypoxia, volume overload, she was admitted for IV diuresis.  Patient with increased oxygen requirement 9/10, at 8 L nasal cannula, PCCM consulted, work-up significant for atelectasis and pulmonary edema.  Patient improved with IV diuresis, and pulmonary PT, renal function Fluctuating in the setting of diuresis.   Subjective:    Kayla MirzaVickie Brown today reports she had some dyspnea overnight this morning which improved after nebulizer treatment .   Assessment  & Plan :    Principal Problem:   Acute respiratory failure with hypoxia (HCC) Active Problems:   Essential hypertension, benign   OSA (obstructive sleep apnea)   Pulmonary edema   AKI (acute kidney injury) (HCC)  Acute hypoxic respiratory failure secondary to pulmonary edema - Secondary to volume overload, and atelectasis, patient with increased oxygen requirement to 8 L nasal cannula one-point,  but it is improving currently with diuresis, and chest PT, this morning she is on 3 L nasal cannula, were able to wean her to room air this morning . -Pulmonary input greatly appreciated, this is most likely  in the setting of acute pulmonary edema, and poor inspiratory effort/atelectasis -Encouraged to use incentive spirometry, ambulate in the hallway with staff, and out of bed to chair. -VQ scan with no evidence of PE, heparin GTT has been stopped .  Acute on chronic diastolic CHF -Repeat echo this admission present preserved EF, but impaired relaxation -Diuresis on hold in the setting of hypotension and worsening renal function  AKI on CKD 3 -Baseline creatinine  1.2, significantly increased to 2.3 today, I think this is multifactorial in the setting of hypotension she had yesterday from diuresis and antihypertensive regimen where she required fluid boluses, I have stopped all antihypertensive medications, and giving fluid resuscitation today, avoid nephrotoxic medication.   Essential hypertension -She was hypotensive yesterday requiring IV fluids, I have hold her antihypertensive medications  OSA -CPAP at night  Diabetes mellitus -A1c is 8 , Outpatient Diabetes medications: Jardiance 10 mg daily, Glipizide XL 10 mg QAM, Metformin XR 1000 mg BID, Actos 30 mg -Overall her A1c showing good control, but her CBG extremely uncontrolled during hospital stay, running usually in the 400-500, despite significantly high Lantus dosing ( no insulin requirement as an outpatient ), I have increased her Lantus to 65 units daily, and 40 units nightly, 15 units before meals, add insulin sliding scale .    Code Status : Full  Family Communication  : D/W husband at bedside  Disposition Plan  : Home  Barriers For Discharge : Worsening creatinine 2.3 and hypotension overnight  Consults  :  PCCM  Procedures  : None  DVT Prophylaxis  :  [place] Heparin   Lab Results  Component Value Date   PLT 495 (H) 03/11/2019    Antibiotics  :    Anti-infectives (From admission, onward)   None        Objective:   Vitals:   03/11/19 0841 03/11/19 1142 03/11/19 1204 03/11/19 1637  BP:  135/60  (!) 116/59    Pulse: 85 86 81 86  Resp: 16 20 (!) 24 (!) 25  Temp:  98.2 F (36.8 C)  98.1 F (36.7 C)  TempSrc:  Oral  Oral  SpO2: 95% 93% 93% 93%  Weight:      Height:        Wt Readings from Last 3 Encounters:  03/11/19 95.6 kg  02/16/19 102.9 kg  02/05/19 100.7 kg     Intake/Output Summary (Last 24 hours) at 03/11/2019 1709 Last data filed at 03/11/2019 0950 Gross per 24 hour  Intake 240 ml  Output 120 ml  Net 120 ml     Physical Exam  Awake Alert, Oriented X 3, No new F.N deficits, Normal affect Symmetrical Chest wall movement, Good air movement bilaterally, CTAB RRR,No Gallops,Rubs or new Murmurs, No Parasternal Heave +ve B.Sounds, Abd Soft, No tenderness, No rebound - guarding or rigidity. No Cyanosis, Clubbing or edema, No new Rash or bruise        Data Review:    CBC Recent Labs  Lab 03/06/19 0440 03/07/19 0512 03/08/19 0656 03/10/19 0352 03/11/19 0452  WBC 11.1* 12.3* 13.0* 15.6* 15.7*  HGB 11.3* 12.4 12.1 12.9 11.9*  HCT 33.6* 37.3 35.2* 37.4 36.0  PLT 349 403* 407* 468* 495*  MCV 86.8 87.8 85.9 85.4 88.2  MCH 29.2 29.2 29.5 29.5 29.2  MCHC 33.6 33.2 34.4 34.5 33.1  RDW 14.3 14.0 14.0 13.9 14.3    Chemistries  Recent Labs  Lab 03/07/19 0512 03/08/19 0656 03/09/19 0610 03/10/19 0352 03/11/19 0452  NA 134* 132* 133* 133* 131*  K 4.2 3.9 4.3 3.6 3.7  CL 102 98 97* 96* 97*  CO2 20* 23 23 23 22   GLUCOSE 273* 319* 220* 127* 173*  BUN 65* 75* 90* 98* 107*  CREATININE 1.47* 1.48* 1.80* 1.67* 2.32*  CALCIUM 9.6 9.6 9.8 9.7 9.2   ------------------------------------------------------------------------------------------------------------------ No results for input(s): CHOL, HDL, LDLCALC, TRIG, CHOLHDL, LDLDIRECT in the last 72 hours.  Lab Results  Component Value Date   HGBA1C 8.0 (H) 03/06/2019   ------------------------------------------------------------------------------------------------------------------ No results for input(s): TSH,  T4TOTAL, T3FREE, THYROIDAB in the last 72 hours.  Invalid input(s): FREET3 ------------------------------------------------------------------------------------------------------------------ No results for input(s): VITAMINB12, FOLATE, FERRITIN, TIBC, IRON, RETICCTPCT in the last 72 hours.  Coagulation profile No results for input(s): INR, PROTIME in the last 168 hours.  No results for input(s): DDIMER in the last 72 hours.  Cardiac Enzymes No results for input(s): CKMB, TROPONINI, MYOGLOBIN in the last 168 hours.  Invalid input(s): CK ------------------------------------------------------------------------------------------------------------------    Component Value Date/Time   BNP 50.8 03/03/2019 2357   BNP 25.5 04/08/2015 0753    Inpatient Medications  Scheduled Meds:  atorvastatin  40 mg Oral q1800   cholecalciferol  2,000 Units Oral Daily   fluticasone  2 spray Each Nare Daily   glipiZIDE  10 mg Oral Q breakfast   heparin injection (subcutaneous)  5,000 Units Subcutaneous Q8H   insulin aspart  0-15 Units Subcutaneous TID WC   insulin aspart  0-5 Units Subcutaneous QHS   insulin aspart  6 Units Subcutaneous TID WC   insulin glargine  40 Units Subcutaneous QHS   insulin glargine  65 Units Subcutaneous Daily   loratadine  10 mg Oral Daily   Melatonin  3 mg Oral QHS   multivitamin  1 tablet Oral Daily   omega-3 acid ethyl esters  1 g Oral Daily   sertraline  50 mg Oral Daily   umeclidinium-vilanterol  1 puff Inhalation Daily   Continuous Infusions:  sodium chloride 50 mL/hr at 03/11/19 0922   PRN Meds:.acetaminophen **OR** acetaminophen, hydrALAZINE, ipratropium-albuterol, ondansetron (ZOFRAN) IV, simethicone  Micro Results Recent Results (from the past 240 hour(s))  SARS Coronavirus 2 [facility] order, Performed in [facility] hospital lab) Nasopharyngeal Nasopharyngeal Swab     Status: None   Collection Time: 03/04/19 12:01 AM   Specimen:  Nasopharyngeal Swab  Result Value Ref Range Status   SARS Coronavirus 2 NEGATIVE NEGATIVE Final    Comment: (NOTE) If result is NEGATIVE SARS-CoV-2 target nucleic acids are NOT DETECTED. The SARS-CoV-2 RNA is generally detectable in upper and lower  respiratory specimens during the acute phase of infection. The lowest  concentration of SARS-CoV-2 viral copies this assay can detect is 250  copies / mL. A negative result does not preclude SARS-CoV-2 infection  and should not be used as the sole basis for treatment or other  patient management decisions.  A negative result may occur with  improper specimen collection / handling, submission of specimen other  than nasopharyngeal swab, presence of viral mutation(s) within the  areas targeted by this assay, and inadequate number of viral copies  (<250 copies / mL). A negative result must be combined with clinical  observations, patient history, and epidemiological information. If result is POSITIVE SARS-CoV-2 target nucleic acids are DETECTED. The SARS-CoV-2 RNA is generally detectable in upper and lower  respiratory specimens dur ing the acute phase of infection.  Positive  results are indicative of active infection with SARS-CoV-2.  Clinical  correlation with patient history and other diagnostic information is  necessary to determine patient infection status.  Positive results do  not rule out bacterial infection or co-infection with other viruses. If result is PRESUMPTIVE POSTIVE SARS-CoV-2 nucleic acids MAY BE PRESENT.   A presumptive positive result was obtained on the submitted specimen  and confirmed on repeat testing.  While 2019 novel coronavirus  (SARS-CoV-2) nucleic acids may be present in the submitted sample  additional  confirmatory testing may be necessary for epidemiological  and / or clinical management purposes  to differentiate between  SARS-CoV-2 and other Sarbecovirus currently known to infect humans.  If clinically  indicated additional testing with an alternate test  methodology [phone]) is advised. The SARS-CoV-2 RNA is generally  detectable in upper and lower respiratory sp ecimens during the acute  phase of infection. The expected result is Negative. Fact Sheet for Patients:  BoilerBrush.com.cy Fact Sheet for Healthcare Providers: https://pope.com/ This test is not yet approved or cleared by the [place] FDA and has been authorized for detection and/or diagnosis of SARS-CoV-2 by FDA under an Emergency Use Authorization (EUA).  This EUA will remain in effect (meaning this test can be used) for the duration of the COVID-19 declaration under Section 564(b)(1) of the Act, 21 U.S.C. section 360bbb-3(b)(1), unless the authorization is terminated or revoked sooner. Performed at [facility], [address] [place] [address], [place], [place] [address]   Urine Culture     Status: Abnormal   Collection Time: 03/04/19  8:47 AM   Specimen: Urine, Random  Result Value Ref Range Status   Specimen Description URINE, RANDOM  Final   Special Requests NONE  Final   Culture (A)  Final    <10,000 COLONIES/mL INSIGNIFICANT GROWTH Performed at [facility], [address], [place], [place] [address]    Report Status 03/05/2019 FINAL  Final  SARS Coronavirus 2 [facility] order, Performed in [facility] hospital lab) Nasopharyngeal Nasopharyngeal Swab     Status: None   Collection Time: 03/05/19  3:30 PM   Specimen: Nasopharyngeal Swab  Result Value Ref Range Status   SARS Coronavirus 2 NEGATIVE NEGATIVE Final    Comment: (NOTE) If result is NEGATIVE SARS-CoV-2 target nucleic acids are NOT DETECTED. The SARS-CoV-2 RNA is generally detectable in upper and lower  respiratory specimens during the acute phase of infection. The lowest  concentration of SARS-CoV-2 viral copies this assay can detect is 250  copies / mL. A negative result does not preclude SARS-CoV-2  infection  and should not be used as the sole basis for treatment or other  patient management decisions.  A negative result may occur with  improper specimen collection / handling, submission of specimen other  than nasopharyngeal swab, presence of viral mutation(s) within the  areas targeted by this assay, and inadequate number of viral copies  (<250 copies / mL). A negative result must be combined with clinical  observations, patient history, and epidemiological information. If result is POSITIVE SARS-CoV-2 target nucleic acids are DETECTED. The SARS-CoV-2 RNA is generally detectable in upper and lower  respiratory specimens dur ing the acute phase of infection.  Positive  results are indicative of active infection with SARS-CoV-2.  Clinical  correlation with patient history and other diagnostic information is  necessary to determine patient infection status.  Positive results do  not rule out bacterial infection or co-infection with other viruses. If result is PRESUMPTIVE POSTIVE SARS-CoV-2 nucleic acids MAY BE PRESENT.   A presumptive positive result was obtained on the submitted specimen  and confirmed on repeat testing.  While 2019 novel coronavirus  (SARS-CoV-2) nucleic acids may be present in the submitted sample  additional confirmatory testing may be necessary for epidemiological  and / or clinical management purposes  to differentiate between  SARS-CoV-2 and other Sarbecovirus currently known to infect humans.  If clinically indicated additional testing with an alternate test  methodology [phone]) is advised. The SARS-CoV-2 RNA is generally  detectable in upper and lower respiratory sp ecimens during the acute  phase of infection. The expected result is Negative. Fact Sheet for Patients:  BoilerBrush.com.cy Fact Sheet for Healthcare Providers: https://pope.com/ This test is not yet approved or cleared by the [place]  FDA and has been authorized for detection and/or diagnosis of SARS-CoV-2 by FDA under an Emergency Use Authorization (EUA).  This EUA will remain in effect (meaning this test can be used) for the duration of the COVID-19 declaration under Section 564(b)(1) of the Act, 21 U.S.C. section 360bbb-3(b)(1), unless the authorization is terminated or revoked sooner. Performed at [facility], [address], [place], [place] [address]   Respiratory Panel by PCR     Status: None   Collection Time: 03/05/19  6:25 PM   Specimen: Nasopharyngeal Swab; Respiratory  Result Value Ref Range Status   Adenovirus NOT DETECTED NOT DETECTED Final   Coronavirus 229E NOT DETECTED NOT DETECTED Final    Comment: (NOTE) The Coronavirus on the Respiratory Panel, DOES NOT test for the novel  Coronavirus (2019 nCoV)    Coronavirus HKU1 NOT DETECTED NOT DETECTED Final   Coronavirus NL63 NOT DETECTED NOT DETECTED Final   Coronavirus OC43 NOT DETECTED NOT DETECTED Final   Metapneumovirus NOT DETECTED NOT DETECTED Final   Rhinovirus / Enterovirus NOT DETECTED NOT DETECTED Final   Influenza A NOT DETECTED NOT DETECTED Final   Influenza B NOT DETECTED NOT DETECTED Final   Parainfluenza Virus 1 NOT DETECTED NOT DETECTED Final   Parainfluenza Virus 2 NOT DETECTED NOT DETECTED Final   Parainfluenza Virus 3 NOT DETECTED NOT DETECTED Final   Parainfluenza Virus 4 NOT DETECTED NOT DETECTED Final   Respiratory Syncytial Virus NOT DETECTED NOT DETECTED Final   Bordetella pertussis NOT DETECTED NOT DETECTED Final   Chlamydophila pneumoniae NOT DETECTED NOT DETECTED Final   Mycoplasma pneumoniae NOT DETECTED NOT DETECTED Final    Comment: Performed at [facility], [address] [place], [place] [address]    Radiology Reports Dg Chest 2 View  Result Date: 03/03/2019 CLINICAL DATA:  Pneumonia.  Short of breath. EXAM: CHEST - 2 VIEW COMPARISON:  02/17/2019 FINDINGS: Mild cardiac enlargement. Pulmonary vascular  congestion identified. Lungs are hyperinflated and there are coarsened interstitial markings noted bilaterally. Pleural thickening along the major fissure is again noted. Scar or atelectasis noted in the right middle lobe. IMPRESSION: Cardiac enlargement and mild pulmonary vascular congestion with thickening along the major fissure of the right lung. Findings may reflect early CHF. No airspace consolidation identified. Electronically Signed   By: Signa Kell M.D.   On: 03/03/2019 17:55   Dg Chest 2 View  Result Date: 02/18/2019 CLINICAL DATA:  Dyspnea on exertion.  Pulmonary hypertension. EXAM: CHEST - 2 VIEW COMPARISON:  None. FINDINGS: Normal heart size. Normal mediastinal contour. No pneumothorax. No pleural effusion. Patchy right middle lobe opacity. No pulmonary edema. IMPRESSION: Patchy right middle lobe opacity suggests a pneumonia. Recommend follow-up PA and lateral post treatment chest radiographs in 4-6 weeks. Electronically Signed   By: Delbert Phenix M.D.   On: 02/18/2019 08:47   Ct Chest Wo Contrast  Result Date: 03/05/2019 CLINICAL DATA:  Acute hypoxemic respiratory failure EXAM: CT CHEST WITHOUT CONTRAST TECHNIQUE: Multidetector CT imaging of the chest was performed following the standard protocol without IV contrast. COMPARISON:  CT 04/05/2015 FINDINGS: Cardiovascular: Normal heart size. No pericardial effusion. Atherosclerotic calcification of the aortic leaflets is noted. There is mitral annular calcification is well. The aorta  is normal caliber with atheromatous plaque. Additional calcifications are present in the proximal great vessels and upper abdominal aorta. Major venous structures are unremarkable. Mediastinum/Nodes: Numerous though nonenlarged mediastinal nodes are present. Evaluation for hilar adenopathy is limited in the absence of contrast. No axillary adenopathy. Thyroid gland and thoracic inlet are unremarkable. Bowing of the posterior trachea 0 likely related to imaging  during exhalation. There is mild airways thickening. The esophagus is unremarkable. Lungs/Pleura: Redemonstration of mosaic attenuation throughout the lungs superimposed on centrilobular and paraseptal emphysema. There is increasing bandlike areas of subsegmental atelectasis and/or scarring as well as additional atelectatic change posteriorly in the lung bases. No pneumothorax. No effusion. Stable subpleural area of scarring is seen in the right middle lobe. Upper Abdomen: Small accessory splenule. Mild bilateral symmetric perinephric stranding, a nonspecific finding though may correlate with either age or decreased renal function. Portion of the hepatic flexure is interposed anterior to the liver. Fatty replacement of the pancreas. No pancreatic ductal dilatation or surrounding inflammatory changes. No acute abnormalities present in the visualized portions of the upper abdomen. Musculoskeletal: Multilevel degenerative changes are present in the imaged portions of the spine. No acute osseous abnormality or suspicious osseous lesion. IMPRESSION: 1. Extensive atelectatic changes are present throughout the lungs with a dependent predominance. 2. Redemonstration of mosaic attenuation throughout the lungs superimposed on centrilobular and paraseptal emphysema, suggestive of small airways disease. 3. Mild bilateral symmetric perinephric stranding, a nonspecific finding though may correlate with either age or decreased renal function. 4. Aortic Atherosclerosis (ICD10-I70.0) and Emphysema (ICD10-J43.9). Electronically Signed   By: Kreg ShropshirePrice  DeHay M.D.   On: 03/05/2019 22:41   Nm Pulmonary Perf And Vent  Result Date: 03/04/2019 CLINICAL DATA:  Evaluate for pulmonary embolism. Short of breath with hypertension. EXAM: NUCLEAR MEDICINE VENTILATION - PERFUSION LUNG SCAN TECHNIQUE: Ventilation images were obtained in multiple projections using inhaled aerosol Tc-2437m DTPA. Perfusion images were obtained in multiple projections  after intravenous injection of Tc-2837m MAA. RADIOPHARMACEUTICALS:  32.7 mCi of Tc-5637m DTPA aerosol inhalation and 1.53 mCi Tc3637m MAA IV COMPARISON:  Chest radiograph 03/03/2019 FINDINGS: Ventilation: Large segmental ventilation defect identified involving the superior segment of left lower lobe. Medium to large segmental perfusion defect identified within the lateral left lower lobe. Perfusion: Corresponding perfusion abnormalities are noted within the superior segment of left lower lobe and lateral left lower lobe. IMPRESSION: No unmatched segmental perfusion defects identified to suggest acute pulmonary embolus. Electronically Signed   By: Signa Kellaylor  Stroud M.D.   On: 03/04/2019 15:54   Dg Chest Port 1 View  Result Date: 03/11/2019 CLINICAL DATA:  68 year old female with a proxy. EXAM: PORTABLE CHEST 1 VIEW COMPARISON:  Chest radiograph dated 03/05/2019 FINDINGS: Chronic interstitial coarsening and mild bronchitic changes. No focal consolidation, pleural effusion, or pneumothorax. Linear density in the left mid lung field, likely atelectatic changes. The cardiac silhouette is within normal limits. No acute osseous pathology. IMPRESSION: No active disease. Electronically Signed   By: Elgie CollardArash  Radparvar M.D.   On: 03/11/2019 10:21   Dg Chest Port 1 View  Result Date: 03/05/2019 CLINICAL DATA:  Hypoxia EXAM: PORTABLE CHEST 1 VIEW COMPARISON:  03/03/2019 FINDINGS: Cardiomegaly. Mild irregular scarring or atelectasis of the mid lungs bilaterally. No acute appearing airspace opacity. IMPRESSION: Cardiomegaly. Mild irregular scarring or atelectasis of the mid lungs bilaterally. No acute appearing airspace opacity. Electronically Signed   By: Lauralyn PrimesAlex  Bibbey M.D.   On: 03/05/2019 15:07     Huey Bienenstockawood Vici Novick M.D on 03/11/2019 at 5:09 PM  Between  7am to 7pm - Pager - [phone]  After 7pm go to [email or web address] - password [email or web address]  [facility] -  Office  [phone]

## 2019-03-12 LAB — GLUCOSE, CAPILLARY
Glucose-Capillary: 78 mg/dL (ref 70–99)
Glucose-Capillary: 259 mg/dL — ABNORMAL HIGH (ref 70–99)
Glucose-Capillary: 267 mg/dL — ABNORMAL HIGH (ref 70–99)
Glucose-Capillary: 114 mg/dL — ABNORMAL HIGH (ref 70–99)
Glucose-Capillary: 58 mg/dL — ABNORMAL LOW (ref 70–99)

## 2019-03-12 LAB — BASIC METABOLIC PANEL
Anion gap: 12 (ref 5–15)
BUN: 109 mg/dL — ABNORMAL HIGH (ref 8–23)
CO2: 22 mmol/L (ref 22–32)
Calcium: 9.4 mg/dL (ref 8.9–10.3)
Chloride: 101 mmol/L (ref 98–111)
Creatinine, Ser: 2 mg/dL — ABNORMAL HIGH (ref 0.44–1.00)
GFR calc Af Amer: 29 mL/min — ABNORMAL LOW (ref >60)
GFR calc non Af Amer: 25 mL/min — ABNORMAL LOW (ref >60)
Glucose, Bld: 83 mg/dL (ref 70–99)
Potassium: 4.1 mmol/L (ref 3.5–5.1)
Sodium: 135 mmol/L (ref 135–145)

## 2019-03-12 MED ORDER — AMLODIPINE BESYLATE 10 MG PO TABS
10.0000 mg | ORAL_TABLET | Freq: Every day | ORAL | Status: DC
  Administered 2019-03-12 – 2019-03-16 (×5): 10 mg via ORAL
  Filled 2019-03-12 (×5): qty 1

## 2019-03-12 MED ORDER — INSULIN GLARGINE 100 UNIT/ML ~~LOC~~ SOLN
25.0000 [IU] | Freq: Every day | SUBCUTANEOUS | Status: DC
  Administered 2019-03-12: 25 [IU] via SUBCUTANEOUS
  Filled 2019-03-12 (×3): qty 0.25

## 2019-03-12 MED ORDER — INSULIN GLARGINE 100 UNIT/ML ~~LOC~~ SOLN
50.0000 [IU] | Freq: Every day | SUBCUTANEOUS | Status: DC
  Administered 2019-03-13 – 2019-03-14 (×2): 50 [IU] via SUBCUTANEOUS
  Filled 2019-03-12 (×2): qty 0.5

## 2019-03-12 NOTE — Progress Notes (Signed)
Hypoglycemic Event  CBG: 58  Treatment: 4oz juice  Symptoms: "sleepy"  Follow-up CBG: Time: 10:13 PM  CBG Result: 114  Possible Reasons for Event: poor intake  Comments/MD notified:    Bryssa Tones, Blondell Reveal

## 2019-03-12 NOTE — Progress Notes (Signed)
Inpatient Diabetes Program Recommendations  AACE/ADA: New Consensus Statement on Inpatient Glycemic Control (2015)  Target Ranges:  Prepandial:   less than 140 mg/dL      Peak postprandial:   less than 180 mg/dL (1-2 hours)      Critically ill patients:  140 - 180 mg/dL   Lab Results  Component Value Date   GLUCAP 259 (H) 03/12/2019   HGBA1C 8.0 (H) 03/06/2019    Review of Glycemic Control Results for Kayla Brown, Kayla Brown (MRN [id number]) as of 03/12/2019 13:15  Ref. Range 03/10/2019 16:40 03/10/2019 19:35 03/11/2019 08:06 03/11/2019 11:39 03/11/2019 16:33 03/11/2019 21:02 03/12/2019 07:53 03/12/2019 11:30  Glucose-Capillary Latest Ref Range: 70 - 99 mg/dL 185 (H) 125 (H) 151 (H) 298 (H) 255 (H) 157 (H) 78 259 (H)  Diabetes history: DM2 Outpatient Diabetes medications: Jardiance 10 mg daily, Glipizide XL 10 mg QAM, Metformin XR 1000 mg BID, Actos 30 mg Current orders for Inpatient glycemic control: Lantus 65 units QAM, Lantus 40 units QHS, Novolog 6 units TID with meals, Novolog 0-15 units TID with meals, Novolog 0-5 units QHs, Glipizide XL 10 mg QAM  Inpatient Diabetes Program Recommendations:    Fasting CBG<80 mg/dL.  Consider reduction of Lantus to 55 units in the AM and 30 units in the PM.    Thanks,  Adah Perl, RN, BC-ADM Inpatient Diabetes Coordinator Pager [phone] (8a-5p)

## 2019-03-12 NOTE — Plan of Care (Signed)
  Problem: Education: Goal: Knowledge of General Education information will improve Description: Including pain rating scale, medication(s)/side effects and non-pharmacologic comfort measures Outcome: Progressing   Problem: Health Behavior/Discharge Planning: Goal: Ability to manage health-related needs will improve Outcome: Progressing   Problem: Clinical Measurements: Goal: Respiratory complications will improve Outcome: Progressing   Problem: Activity: Goal: Risk for activity intolerance will decrease Outcome: Progressing   Problem: Nutrition: Goal: Adequate nutrition will be maintained Outcome: Progressing   Problem: Pain Managment: Goal: General experience of comfort will improve Outcome: Progressing   

## 2019-03-12 NOTE — Progress Notes (Signed)
Occupational Therapy Treatment Patient Details Name: Kayla Brown MRN: [id number] DOB: 01/03/1951 Today's Date: 03/12/2019    History of present illness 68 y.o. female with medical history significant of hypertension, pulmonary hypertension, diabetes, morbid obesity, obstructive sleep apnea presenting 03/03/19 to the hospital for evaluation of shortness of breath and hypertension.  Patient states her blood pressure has been high for very long time with SBP 220 in ED. Chest x-ray showing cardiac enlargement and mild pulmonary vascular congestion. Findings thought to reflect early CHF. V/Q negative. COVID-19 negative. CBGs as high as 600.    OT comments  Pt progressing towards acute OT goals. Pt continues to present with decreased activity tolerance and DOE. Struggled quite a bit with endurance to walk from bed to bathroom. Pt with DOE and looked as if she did not feel well after that relatively minimal level of activity. Sats on supplemental O2 and pulse looked ok throughout session. O2 sat was  mid to upper 90s; pulse low 100s, mid 80s at start of session. Discussed safety at home and having someone with her when she is mobilizing at home. Pt reports she plans to utilize her rollator with built in seat at home.    Follow Up Recommendations  Home health OT;Supervision/Assistance - 24 hour    Equipment Recommendations  None recommended by OT    Recommendations for Other Services      Precautions / Restrictions Precautions Precautions: Other (comment) Precaution Comments: monitor O2 and pulses Restrictions Weight Bearing Restrictions: No       Mobility Bed Mobility Overal bed mobility: Needs Assistance Bed Mobility: Supine to Sit     Supine to sit: Supervision;HOB elevated(partially elevated HOB)     General bed mobility comments: supervision for safety. extra time and effort, no physical assist  Transfers Overall transfer level: Needs assistance Equipment used: Rolling walker  (2 wheeled) Transfers: Sit to/from Stand Sit to Stand: Min guard              Balance Overall balance assessment: Needs assistance Sitting-balance support: Feet supported Sitting balance-Leahy Scale: Good     Standing balance support: Bilateral upper extremity supported;During functional activity Standing balance-Leahy Scale: Poor Standing balance comment: rw for static standing needed today                           ADL either performed or assessed with clinical judgement   ADL Overall ADL's : Needs assistance/impaired                         Toilet Transfer: Min guard;Ambulation;RW Toilet Transfer Details (indicate cue type and reason): pt walked from bed to bathroom Toileting- Clothing Manipulation and Hygiene: Minimal assistance;Sit to/from stand       Functional mobility during ADLs: Min guard;Minimal assistance General ADL Comments: Decreased activity tolerance. Frequent seated rest breaks. Discussed utilizing rollator seat (has at home) and having someone with her any time she is walking for the next few days.     Vision       Perception     Praxis      Cognition Arousal/Alertness: Awake/alert Behavior During Therapy: WFL for tasks assessed/performed Overall Cognitive Status: Within Functional Limits for tasks assessed  Exercises     Shoulder Instructions       General Comments Pt with DOE 3/4. Visually looked like she was not feeling well as we approached the sink from the bed. Sats on supplemental O2 were in the mid to upper 90s. HR low 100s during in room mobility.    Pertinent Vitals/ Pain       Pain Assessment: No/denies pain  Home Living                                          Prior Functioning/Environment              Frequency  Min 2X/week        Progress Toward Goals  OT Goals(current goals can now be found in the care plan  section)  Progress towards OT goals: Progressing toward goals  Acute Rehab OT Goals Patient Stated Goal: home and feel better OT Goal Formulation: With patient Time For Goal Achievement: 03/21/19 Potential to Achieve Goals: Good ADL Goals Pt Will Perform Grooming: with modified independence;standing Pt Will Perform Upper Body Dressing: with modified independence;sitting Pt Will Perform Lower Body Dressing: with modified independence;sit to/from stand Pt Will Transfer to Toilet: with modified independence;ambulating Pt Will Perform Toileting - Clothing Manipulation and hygiene: with modified independence;sit to/from stand Additional ADL Goal #1: Pt will independently verbalize/demonstrate at least 3 energy conservation techniques to utilize during functional tasks.  Plan Discharge plan remains appropriate    Co-evaluation                 AM-PAC OT "6 Clicks" Daily Activity     Outcome Measure   Help from another person eating meals?: None Help from another person taking care of personal grooming?: A Little Help from another person toileting, which includes using toliet, bedpan, or urinal?: A Little Help from another person bathing (including washing, rinsing, drying)?: A Little Help from another person to put on and taking off regular upper body clothing?: None Help from another person to put on and taking off regular lower body clothing?: A Little 6 Click Score: 20    End of Session Equipment Utilized During Treatment: Gait belt;Rolling walker;Oxygen  OT Visit Diagnosis: Unsteadiness on feet (R26.81);Muscle weakness (generalized) (M62.81);Other (comment)   Activity Tolerance Other (comment)(DOE 3/4)   Patient Left in chair;with call bell/phone within reach   Nurse Communication          Time: [phone] OT Time Calculation (min): 37 min  Charges: OT General Charges $OT Visit: 1 Visit OT Treatments $Self Care/Home Management : 23-37 mins  Tyrone Schimke,  OT Acute Rehabilitation Services Pager: [phone] Office: [phone]    Hortencia Pilar 03/12/2019, 10:58 AM

## 2019-03-12 NOTE — Progress Notes (Signed)
Patient asking many questions about heart failure and diet concerns.  RN educated and given education pamphlets.

## 2019-03-12 NOTE — Care Management Important Message (Signed)
Important Message  Patient Details  Name: Kayla Brown MRN: [id number] Date of Birth: [birth year]   Medicare Important Message Given:  Yes     [name] [address] 03/12/2019, 12:20 PM

## 2019-03-12 NOTE — Progress Notes (Signed)
PROGRESS NOTE                                                                                                                                                                                                             Patient Demographics:    Kayla Brown, is a 68 y.o. female, DOB - 04/01/1951, UJW:[id number]  Admit date - 03/03/2019   Admitting Physician John GiovanniVasundhra Rathore, MD  Outpatient Primary MD for the patient is Beverely Lowabori, Helane RimaKatherine E, MD  LOS - 8   Chief Complaint  Patient presents with  . Shortness of Breath  . Hypertension       Brief Narrative    68 y.o. female with medical history significant of hypertension, pulmonary hypertension, diabetes, morbid obesity, obstructive sleep apnea presenting to the hospital for evaluation of shortness of breath and hypertension, work-up significant for hypoxia, volume overload, she was admitted for IV diuresis.  Patient with increased oxygen requirement 9/10, at 8 L nasal cannula, PCCM consulted, work-up significant for atelectasis and pulmonary edema.  Patient improved with IV diuresis, and pulmonary PT, renal function Fluctuating in the setting of diuresis, peaked at 2.3 on 9/16, secondary to volume depletion/hypotension, improving with gentle hydration.   Subjective:    Kayla Brown today ports no chest pain, no dyspnea, nausea or vomiting .   Assessment  & Plan :    Principal Problem:   Acute respiratory failure with hypoxia (HCC) Active Problems:   Essential hypertension, benign   OSA (obstructive sleep apnea)   Pulmonary edema   AKI (acute kidney injury) (HCC)  Acute hypoxic respiratory failure secondary to pulmonary edema - Secondary to volume overload, and atelectasis, patient with increased oxygen requirement to 8 L nasal cannula initially, this has significantly improved, this morning she is on 2 L nasal cannula, I have weaned to room air which she has been tolerating so far. -Pulmonary  input greatly appreciated, this is most likely in the setting of acute pulmonary edema, and poor inspiratory effort/atelectasis -Encouraged to use incentive spirometry, ambulate in the hallway with staff, and out of bed to chair. -VQ scan with no evidence of PE, heparin GTT has been stopped .  Acute on chronic diastolic CHF -Repeat echo this admission present preserved EF, but impaired relaxation -Requiring IV diuresis, so far she is -5.6 L during hospital admission, Lasix has been  held over last 48 hours in the setting of hypotension and worsening renal function.  AKI on CKD 3 -Baseline creatinine is around 1, it did peak at 2.39/16, this is most likely due to hypotension and overdiuresis. -Improving on gentle hydration, it is to this morning, she does appear to be a euvolemic today, will hold on further IV fluids today, and monitor closely . -Antihypertensive medications were held yesterday.  Essential hypertension -Patient with hypotension 9/15 overnight, where she required fluid boluses, her antihypertensive regimen was on hold including amlodipine, chlorthalidone, Lasix, ramipril and hydralazine. -Blood pressure today started to increase, I will resume on amlodipine for now, and consider adding other medications if remains uncontrolled9 2 oh try to avoid low blood pressure to avoid worsening kidney functions)  OSA -CPAP at night  Diabetes mellitus -A1c is 8 , Outpatient Diabetes medications: Jardiance 10 mg daily, Glipizide XL 10 mg QAM, Metformin XR 1000 mg BID, Actos 30 mg -Overall her A1c showing good control as an outpatient, but during hospital stay she does require significant insulin dose ( no insulin requirement as an outpatient ), is currently on Lantus to 65 units daily, and 40 units nightly, 15 units before meals, and insulin sliding scale . -Have discussed with the patient, likely she will need any new medication or insulin on discharge, given her A1c was 8 on admission, so  her home meds will be resumed on discharge.   Code Status : Full  Family Communication  : D/W husband at bedside  Disposition Plan  : Home in 1 to 2 days  Consults  :  PCCM  Procedures  : None  DVT Prophylaxis  :  [place] Heparin   Lab Results  Component Value Date   PLT 495 (H) 03/11/2019    Antibiotics  :    Anti-infectives (From admission, onward)   None        Objective:   Vitals:   03/12/19 0529 03/12/19 0533 03/12/19 0753 03/12/19 0847  BP:  137/64 (!) 141/67   Pulse:  84 77 87  Resp:    20  Temp:  98.1 F (36.7 C) 97.8 F (36.6 C)   TempSrc:  Oral Oral   SpO2:   100% 91%  Weight: 96.2 kg     Height:        Wt Readings from Last 3 Encounters:  03/12/19 96.2 kg  02/16/19 102.9 kg  02/05/19 100.7 kg     Intake/Output Summary (Last 24 hours) at 03/12/2019 1137 Last data filed at 03/12/2019 0800 Gross per 24 hour  Intake 717 ml  Output -  Net 717 ml     Physical Exam  Awake Alert, Oriented X 3, No new F.N deficits, Normal affect Symmetrical Chest wall movement, Good air movement bilaterally, CTAB RRR,No Gallops,Rubs or new Murmurs, No Parasternal Heave +ve B.Sounds, Abd Soft, No tenderness, No rebound - guarding or rigidity. No Cyanosis, Clubbing or edema, No new Rash or bruise      Data Review:    CBC Recent Labs  Lab 03/06/19 0440 03/07/19 0512 03/08/19 0656 03/10/19 0352 03/11/19 0452  WBC 11.1* 12.3* 13.0* 15.6* 15.7*  HGB 11.3* 12.4 12.1 12.9 11.9*  HCT 33.6* 37.3 35.2* 37.4 36.0  PLT 349 403* 407* 468* 495*  MCV 86.8 87.8 85.9 85.4 88.2  MCH 29.2 29.2 29.5 29.5 29.2  MCHC 33.6 33.2 34.4 34.5 33.1  RDW 14.3 14.0 14.0 13.9 14.3    Chemistries  Recent Labs  Lab 03/08/19 0656 03/09/19  1610 03/10/19 0352 03/11/19 0452 03/12/19 0738  NA 132* 133* 133* 131* 135  K 3.9 4.3 3.6 3.7 4.1  CL 98 97* 96* 97* 101  CO2 23 23 23 22 22   GLUCOSE 319* 220* 127* 173* 83  BUN 75* 90* 98* 107* 109*  CREATININE 1.48* 1.80* 1.67* 2.32*  2.00*  CALCIUM 9.6 9.8 9.7 9.2 9.4   ------------------------------------------------------------------------------------------------------------------ No results for input(s): CHOL, HDL, LDLCALC, TRIG, CHOLHDL, LDLDIRECT in the last 72 hours.  Lab Results  Component Value Date   HGBA1C 8.0 (H) 03/06/2019   ------------------------------------------------------------------------------------------------------------------ No results for input(s): TSH, T4TOTAL, T3FREE, THYROIDAB in the last 72 hours.  Invalid input(s): FREET3 ------------------------------------------------------------------------------------------------------------------ No results for input(s): VITAMINB12, FOLATE, FERRITIN, TIBC, IRON, RETICCTPCT in the last 72 hours.  Coagulation profile No results for input(s): INR, PROTIME in the last 168 hours.  No results for input(s): DDIMER in the last 72 hours.  Cardiac Enzymes No results for input(s): CKMB, TROPONINI, MYOGLOBIN in the last 168 hours.  Invalid input(s): CK ------------------------------------------------------------------------------------------------------------------    Component Value Date/Time   BNP 50.8 03/03/2019 2357   BNP 25.5 04/08/2015 0753    Inpatient Medications  Scheduled Meds: . amLODipine  10 mg Oral Daily  . atorvastatin  40 mg Oral q1800  . cholecalciferol  2,000 Units Oral Daily  . fluticasone  2 spray Each Nare Daily  . glipiZIDE  10 mg Oral Q breakfast  . heparin injection (subcutaneous)  5,000 Units Subcutaneous Q8H  . insulin aspart  0-15 Units Subcutaneous TID WC  . insulin aspart  0-5 Units Subcutaneous QHS  . insulin aspart  6 Units Subcutaneous TID WC  . insulin glargine  40 Units Subcutaneous QHS  . insulin glargine  65 Units Subcutaneous Daily  . loratadine  10 mg Oral Daily  . Melatonin  3 mg Oral QHS  . multivitamin  1 tablet Oral Daily  . omega-3 acid ethyl esters  1 g Oral Daily  . sertraline  50 mg Oral Daily   . umeclidinium-vilanterol  1 puff Inhalation Daily   Continuous Infusions:  PRN Meds:.acetaminophen **OR** acetaminophen, hydrALAZINE, ipratropium-albuterol, ondansetron (ZOFRAN) IV, simethicone  Micro Results Recent Results (from the past 240 hour(s))  SARS Coronavirus 2 [facility] order, Performed in [facility] hospital lab) Nasopharyngeal Nasopharyngeal Swab     Status: None   Collection Time: 03/04/19 12:01 AM   Specimen: Nasopharyngeal Swab  Result Value Ref Range Status   SARS Coronavirus 2 NEGATIVE NEGATIVE Final    Comment: (NOTE) If result is NEGATIVE SARS-CoV-2 target nucleic acids are NOT DETECTED. The SARS-CoV-2 RNA is generally detectable in upper and lower  respiratory specimens during the acute phase of infection. The lowest  concentration of SARS-CoV-2 viral copies this assay can detect is 250  copies / mL. A negative result does not preclude SARS-CoV-2 infection  and should not be used as the sole basis for treatment or other  patient management decisions.  A negative result may occur with  improper specimen collection / handling, submission of specimen other  than nasopharyngeal swab, presence of viral mutation(s) within the  areas targeted by this assay, and inadequate number of viral copies  (<250 copies / mL). A negative result must be combined with clinical  observations, patient history, and epidemiological information. If result is POSITIVE SARS-CoV-2 target nucleic acids are DETECTED. The SARS-CoV-2 RNA is generally detectable in upper and lower  respiratory specimens dur ing the acute phase of infection.  Positive  results are indicative of active  infection with SARS-CoV-2.  Clinical  correlation with patient history and other diagnostic information is  necessary to determine patient infection status.  Positive results do  not rule out bacterial infection or co-infection with other viruses. If result is PRESUMPTIVE POSTIVE SARS-CoV-2 nucleic acids MAY  BE PRESENT.   A presumptive positive result was obtained on the submitted specimen  and confirmed on repeat testing.  While 2019 novel coronavirus  (SARS-CoV-2) nucleic acids may be present in the submitted sample  additional confirmatory testing may be necessary for epidemiological  and / or clinical management purposes  to differentiate between  SARS-CoV-2 and other Sarbecovirus currently known to infect humans.  If clinically indicated additional testing with an alternate test  methodology [phone]([id number]) is advised. The SARS-CoV-2 RNA is generally  detectable in upper and lower respiratory sp ecimens during the acute  phase of infection. The expected result is Negative. Fact Sheet for Patients:  BoilerBrush.com.cyhttps://www.fda.gov/media/136312/download Fact Sheet for Healthcare Providers: https://pope.com/https://www.fda.gov/media/136313/download This test is not yet approved or cleared by the [place] FDA and has been authorized for detection and/or diagnosis of SARS-CoV-2 by FDA under an Emergency Use Authorization (EUA).  This EUA will remain in effect (meaning this test can be used) for the duration of the COVID-19 declaration under Section 564(b)(1) of the Act, 21 U.S.C. section 360bbb-3(b)(1), unless the authorization is terminated or revoked sooner. Performed at [facility], [address] [place], [place] [address]   Urine Culture     Status: Abnormal   Collection Time: 03/04/19  8:47 AM   Specimen: Urine, Random  Result Value Ref Range Status   Specimen Description URINE, RANDOM  Final   Special Requests NONE  Final   Culture (A)  Final    <10,000 COLONIES/mL INSIGNIFICANT GROWTH Performed at [facility], [address] [place], [place] [address]    Report Status 03/05/2019 FINAL  Final  SARS Coronavirus 2 [facility](Hospital order, Performed in [facility] hospital lab) Nasopharyngeal Nasopharyngeal Swab     Status: None   Collection Time: 03/05/19  3:30 PM   Specimen: Nasopharyngeal Swab   Result Value Ref Range Status   SARS Coronavirus 2 NEGATIVE NEGATIVE Final    Comment: (NOTE) If result is NEGATIVE SARS-CoV-2 target nucleic acids are NOT DETECTED. The SARS-CoV-2 RNA is generally detectable in upper and lower  respiratory specimens during the acute phase of infection. The lowest  concentration of SARS-CoV-2 viral copies this assay can detect is 250  copies / mL. A negative result does not preclude SARS-CoV-2 infection  and should not be used as the sole basis for treatment or other  patient management decisions.  A negative result may occur with  improper specimen collection / handling, submission of specimen other  than nasopharyngeal swab, presence of viral mutation(s) within the  areas targeted by this assay, and inadequate number of viral copies  (<250 copies / mL). A negative result must be combined with clinical  observations, patient history, and epidemiological information. If result is POSITIVE SARS-CoV-2 target nucleic acids are DETECTED. The SARS-CoV-2 RNA is generally detectable in upper and lower  respiratory specimens dur ing the acute phase of infection.  Positive  results are indicative of active infection with SARS-CoV-2.  Clinical  correlation with patient history and other diagnostic information is  necessary to determine patient infection status.  Positive results do  not rule out bacterial infection or co-infection with other viruses. If result is PRESUMPTIVE POSTIVE SARS-CoV-2 nucleic acids MAY BE PRESENT.  A presumptive positive result was obtained on the submitted specimen  and confirmed on repeat testing.  While 2019 novel coronavirus  (SARS-CoV-2) nucleic acids may be present in the submitted sample  additional confirmatory testing may be necessary for epidemiological  and / or clinical management purposes  to differentiate between  SARS-CoV-2 and other Sarbecovirus currently known to infect humans.  If clinically indicated additional  testing with an alternate test  methodology [phone]) is advised. The SARS-CoV-2 RNA is generally  detectable in upper and lower respiratory sp ecimens during the acute  phase of infection. The expected result is Negative. Fact Sheet for Patients:  BoilerBrush.com.cy Fact Sheet for Healthcare Providers: https://pope.com/ This test is not yet approved or cleared by the [place] FDA and has been authorized for detection and/or diagnosis of SARS-CoV-2 by FDA under an Emergency Use Authorization (EUA).  This EUA will remain in effect (meaning this test can be used) for the duration of the COVID-19 declaration under Section 564(b)(1) of the Act, 21 U.S.C. section 360bbb-3(b)(1), unless the authorization is terminated or revoked sooner. Performed at [facility], [address], [place], [place] [address]   Respiratory Panel by PCR     Status: None   Collection Time: 03/05/19  6:25 PM   Specimen: Nasopharyngeal Swab; Respiratory  Result Value Ref Range Status   Adenovirus NOT DETECTED NOT DETECTED Final   Coronavirus 229E NOT DETECTED NOT DETECTED Final    Comment: (NOTE) The Coronavirus on the Respiratory Panel, DOES NOT test for the novel  Coronavirus (2019 nCoV)    Coronavirus HKU1 NOT DETECTED NOT DETECTED Final   Coronavirus NL63 NOT DETECTED NOT DETECTED Final   Coronavirus OC43 NOT DETECTED NOT DETECTED Final   Metapneumovirus NOT DETECTED NOT DETECTED Final   Rhinovirus / Enterovirus NOT DETECTED NOT DETECTED Final   Influenza A NOT DETECTED NOT DETECTED Final   Influenza B NOT DETECTED NOT DETECTED Final   Parainfluenza Virus 1 NOT DETECTED NOT DETECTED Final   Parainfluenza Virus 2 NOT DETECTED NOT DETECTED Final   Parainfluenza Virus 3 NOT DETECTED NOT DETECTED Final   Parainfluenza Virus 4 NOT DETECTED NOT DETECTED Final   Respiratory Syncytial Virus NOT DETECTED NOT DETECTED Final   Bordetella pertussis NOT  DETECTED NOT DETECTED Final   Chlamydophila pneumoniae NOT DETECTED NOT DETECTED Final   Mycoplasma pneumoniae NOT DETECTED NOT DETECTED Final    Comment: Performed at [facility], [address], [place], [place] [address]    Radiology Reports Dg Chest 2 View  Result Date: 03/03/2019 CLINICAL DATA:  Pneumonia.  Short of breath. EXAM: CHEST - 2 VIEW COMPARISON:  02/17/2019 FINDINGS: Mild cardiac enlargement. Pulmonary vascular congestion identified. Lungs are hyperinflated and there are coarsened interstitial markings noted bilaterally. Pleural thickening along the major fissure is again noted. Scar or atelectasis noted in the right middle lobe. IMPRESSION: Cardiac enlargement and mild pulmonary vascular congestion with thickening along the major fissure of the right lung. Findings may reflect early CHF. No airspace consolidation identified. Electronically Signed   By: Signa Kell M.D.   On: 03/03/2019 17:55   Dg Chest 2 View  Result Date: 02/18/2019 CLINICAL DATA:  Dyspnea on exertion.  Pulmonary hypertension. EXAM: CHEST - 2 VIEW COMPARISON:  None. FINDINGS: Normal heart size. Normal mediastinal contour. No pneumothorax. No pleural effusion. Patchy right middle lobe opacity. No pulmonary edema. IMPRESSION: Patchy right middle lobe opacity suggests a pneumonia. Recommend follow-up PA and lateral post treatment chest radiographs in  4-6 weeks. Electronically Signed   By: Delbert Phenix M.D.   On: 02/18/2019 08:47   Ct Chest Wo Contrast  Result Date: 03/05/2019 CLINICAL DATA:  Acute hypoxemic respiratory failure EXAM: CT CHEST WITHOUT CONTRAST TECHNIQUE: Multidetector CT imaging of the chest was performed following the standard protocol without IV contrast. COMPARISON:  CT 04/05/2015 FINDINGS: Cardiovascular: Normal heart size. No pericardial effusion. Atherosclerotic calcification of the aortic leaflets is noted. There is mitral annular calcification is well. The aorta is normal caliber with  atheromatous plaque. Additional calcifications are present in the proximal great vessels and upper abdominal aorta. Major venous structures are unremarkable. Mediastinum/Nodes: Numerous though nonenlarged mediastinal nodes are present. Evaluation for hilar adenopathy is limited in the absence of contrast. No axillary adenopathy. Thyroid gland and thoracic inlet are unremarkable. Bowing of the posterior trachea 0 likely related to imaging during exhalation. There is mild airways thickening. The esophagus is unremarkable. Lungs/Pleura: Redemonstration of mosaic attenuation throughout the lungs superimposed on centrilobular and paraseptal emphysema. There is increasing bandlike areas of subsegmental atelectasis and/or scarring as well as additional atelectatic change posteriorly in the lung bases. No pneumothorax. No effusion. Stable subpleural area of scarring is seen in the right middle lobe. Upper Abdomen: Small accessory splenule. Mild bilateral symmetric perinephric stranding, a nonspecific finding though may correlate with either age or decreased renal function. Portion of the hepatic flexure is interposed anterior to the liver. Fatty replacement of the pancreas. No pancreatic ductal dilatation or surrounding inflammatory changes. No acute abnormalities present in the visualized portions of the upper abdomen. Musculoskeletal: Multilevel degenerative changes are present in the imaged portions of the spine. No acute osseous abnormality or suspicious osseous lesion. IMPRESSION: 1. Extensive atelectatic changes are present throughout the lungs with a dependent predominance. 2. Redemonstration of mosaic attenuation throughout the lungs superimposed on centrilobular and paraseptal emphysema, suggestive of small airways disease. 3. Mild bilateral symmetric perinephric stranding, a nonspecific finding though may correlate with either age or decreased renal function. 4. Aortic Atherosclerosis (ICD10-I70.0) and Emphysema  (ICD10-J43.9). Electronically Signed   By: Kreg Shropshire M.D.   On: 03/05/2019 22:41   Nm Pulmonary Perf And Vent  Result Date: 03/04/2019 CLINICAL DATA:  Evaluate for pulmonary embolism. Short of breath with hypertension. EXAM: NUCLEAR MEDICINE VENTILATION - PERFUSION LUNG SCAN TECHNIQUE: Ventilation images were obtained in multiple projections using inhaled aerosol Tc-57m DTPA. Perfusion images were obtained in multiple projections after intravenous injection of Tc-32m MAA. RADIOPHARMACEUTICALS:  32.7 mCi of Tc-35m DTPA aerosol inhalation and 1.53 mCi Tc26m MAA IV COMPARISON:  Chest radiograph 03/03/2019 FINDINGS: Ventilation: Large segmental ventilation defect identified involving the superior segment of left lower lobe. Medium to large segmental perfusion defect identified within the lateral left lower lobe. Perfusion: Corresponding perfusion abnormalities are noted within the superior segment of left lower lobe and lateral left lower lobe. IMPRESSION: No unmatched segmental perfusion defects identified to suggest acute pulmonary embolus. Electronically Signed   By: Signa Kell M.D.   On: 03/04/2019 15:54   Dg Chest Port 1 View  Result Date: 03/11/2019 CLINICAL DATA:  68 year old female with a proxy. EXAM: PORTABLE CHEST 1 VIEW COMPARISON:  Chest radiograph dated 03/05/2019 FINDINGS: Chronic interstitial coarsening and mild bronchitic changes. No focal consolidation, pleural effusion, or pneumothorax. Linear density in the left mid lung field, likely atelectatic changes. The cardiac silhouette is within normal limits. No acute osseous pathology. IMPRESSION: No active disease. Electronically Signed   By: Elgie Collard M.D.   On: 03/11/2019 10:21  Dg Chest Port 1 View  Result Date: 03/05/2019 CLINICAL DATA:  Hypoxia EXAM: PORTABLE CHEST 1 VIEW COMPARISON:  03/03/2019 FINDINGS: Cardiomegaly. Mild irregular scarring or atelectasis of the mid lungs bilaterally. No acute appearing airspace opacity.  IMPRESSION: Cardiomegaly. Mild irregular scarring or atelectasis of the mid lungs bilaterally. No acute appearing airspace opacity. Electronically Signed   By: Eddie Candle M.D.   On: 03/05/2019 15:07     Phillips Climes M.D on 03/12/2019 at 11:37 AM  Between 7am to 7pm - Pager - [phone]  After 7pm go to [email or web address] - password [facility]  [facility] -  Office  [phone]

## 2019-03-12 NOTE — Progress Notes (Signed)
Pt has 240 mL fluid left for day based off 1200 FR.

## 2019-03-13 LAB — BASIC METABOLIC PANEL
Anion gap: 8 (ref 5–15)
BUN: 90 mg/dL — ABNORMAL HIGH (ref 8–23)
CO2: 22 mmol/L (ref 22–32)
Calcium: 9.4 mg/dL (ref 8.9–10.3)
Chloride: 105 mmol/L (ref 98–111)
Creatinine, Ser: 1.59 mg/dL — ABNORMAL HIGH (ref 0.44–1.00)
GFR calc Af Amer: 39 mL/min — ABNORMAL LOW (ref >60)
GFR calc non Af Amer: 33 mL/min — ABNORMAL LOW (ref >60)
Glucose, Bld: 65 mg/dL — ABNORMAL LOW (ref 70–99)
Potassium: 4.4 mmol/L (ref 3.5–5.1)
Sodium: 135 mmol/L (ref 135–145)

## 2019-03-13 LAB — CBC
HCT: 35.5 % — ABNORMAL LOW (ref 36.0–46.0)
Hemoglobin: 12.1 g/dL (ref 12.0–15.0)
MCH: 29.6 pg (ref 26.0–34.0)
MCHC: 34.1 g/dL (ref 30.0–36.0)
MCV: 86.8 fL (ref 80.0–100.0)
Platelets: 407 10*3/uL — ABNORMAL HIGH (ref 150–400)
RBC: 4.09 MIL/uL (ref 3.87–5.11)
RDW: 14.1 % (ref 11.5–15.5)
WBC: 11.4 10*3/uL — ABNORMAL HIGH (ref 4.0–10.5)
nRBC: 0 % (ref 0.0–0.2)

## 2019-03-13 LAB — GLUCOSE, CAPILLARY
Glucose-Capillary: 101 mg/dL — ABNORMAL HIGH (ref 70–99)
Glucose-Capillary: 114 mg/dL — ABNORMAL HIGH (ref 70–99)
Glucose-Capillary: 129 mg/dL — ABNORMAL HIGH (ref 70–99)
Glucose-Capillary: 54 mg/dL — ABNORMAL LOW (ref 70–99)
Glucose-Capillary: 153 mg/dL — ABNORMAL HIGH (ref 70–99)

## 2019-03-13 MED ORDER — DOCUSATE SODIUM 100 MG PO CAPS
100.0000 mg | ORAL_CAPSULE | Freq: Two times a day (BID) | ORAL | Status: DC
  Administered 2019-03-13 – 2019-03-16 (×7): 100 mg via ORAL
  Filled 2019-03-13 (×7): qty 1

## 2019-03-13 NOTE — TOC Initial Note (Signed)
Transition of Care [facility]) - Initial/Assessment Note    Patient Details  Name: Kayla Brown MRN: [id number] Date of Birth: [birth year]  Transition of Care [facility]) CM/SW Contact:    Maryclare Labrador, RN Phone Number: 03/13/2019, 12:51 PM  Clinical Narrative:   PTA independent from home with spouse.  Pt has PCP and denied barriers with paying for medications.  Pt is interested in [facility] as ordered  - pt offered choice - pt chose [facility] - agency contacted and referral accepted                Expected Discharge Plan: [place] Barriers to Discharge: Continued Medical Work up   Patient Goals and CMS Choice   CMS Medicare.gov Compare Post Acute Care list provided to:: Patient    Expected Discharge Plan and Services Expected Discharge Plan: [place]       Living arrangements for the past 2 months: Single Family Home                                      Prior Living Arrangements/Services Living arrangements for the past 2 months: Single Family Home Lives with:: Spouse Patient language and need for interpreter reviewed:: Yes        Need for Family Participation in Patient Care: Yes (Comment) Care giver support system in place?: Yes (comment)   Criminal Activity/Legal Involvement Pertinent to Current Situation/Hospitalization: No - Comment as needed  Activities of Daily Living Home Assistive Devices/Equipment: None ADL Screening (condition at time of admission) Patient's cognitive ability adequate to safely complete daily activities?: No Is the patient deaf or have difficulty hearing?: Yes Does the patient have difficulty seeing, even when wearing glasses/contacts?: Yes Does the patient have difficulty concentrating, remembering, or making decisions?: No Patient able to express need for assistance with ADLs?: Yes Does the patient have difficulty dressing or bathing?: No Independently performs ADLs?: Yes (appropriate for developmental  age) Does the patient have difficulty walking or climbing stairs?: Yes Weakness of Legs: None Weakness of Arms/Hands: None  Permission Sought/Granted   Permission granted to share information with : Yes, Verbal Permission Granted     Permission granted to share info w AGENCY: home health agency        Emotional Assessment   Attitude/Demeanor/Rapport: Gracious, Self-Confident, Engaged Affect (typically observed): Accepting Orientation: : Oriented to Self, Oriented to Place, Oriented to  Time, Oriented to Situation      Admission diagnosis:  Shortness of breath [R06.02] Acute pulmonary edema (HCC) [J81.0] Hypoxia [R09.02] Essential hypertension [I10] Acute renal failure, unspecified acute renal failure type [facility]) [N17.9] Patient Active Problem List   Diagnosis Date Noted  . Dyspnea on exertion 03/08/2019  . Acute respiratory failure with hypoxia ([place]) 03/04/2019  . Pulmonary edema 03/04/2019  . AKI (acute kidney injury) ([place]) 03/04/2019  . Physical exam 02/06/2017  . Anxiety and depression 12/05/2016  . Edema of extremities 03/02/2016  . Morbid obesity ([place]) 03/01/2016  . OSA (obstructive sleep apnea) 07/11/2015  . Pulmonary hypertension ([place]) 05/06/2015  . Heart murmur 03/03/2015  . Essential hypertension, benign 03/18/2014  . Hyperlipidemia 03/18/2014  . Post-menopause on HRT (hormone replacement therapy) 03/18/2014  . Diabetes ([place]) 01/01/2013   PCP:  [name], MD Pharmacy:   [id number] - [place], [place] [address] Phone: [phone] Fax: [phone]  Social Determinants of Health (SDOH) Interventions    Readmission Risk Interventions No flowsheet data found.

## 2019-03-13 NOTE — Progress Notes (Signed)
Nutrition Education Note  RD consulted for nutrition education regarding new onset CHF.  **RD working remotely**  Attempted to reach patient by phone x 2, busy signal both times. If still admitted will attempt education at a later date.   RD to provide "Low Sodium Nutrition Therapy" handout from the Academy of Nutrition and Dietetics in discharge instructions. Provides examples on ways to decrease sodium intake in diet. Discourages intake of processed foods and use of salt shaker. Encourages fresh fruits and vegetables as well as whole grain sources of carbohydrates to maximize fiber intake.   Body mass index is 41.42 kg/m. Pt meets criteria for morbid obesity based on current BMI.  Current diet order is Heart Healthy/CHO modified, patient is consuming approximately 0% of meals at this time. Consumed 50-100% of meals 9/17. Labs and medications reviewed. No further nutrition interventions warranted at this time. If additional nutrition issues arise, please re-consult RD.   Kayla Bibles, MS, RD, LDN Inpatient Clinical Dietitian Pager: [phone] After Hours Pager: [phone]

## 2019-03-13 NOTE — Progress Notes (Signed)
PROGRESS NOTE    Kayla Brown  VPX:[id number] DOB: July 28, 1950 DOA: 03/03/2019 PCP: Sheliah Hatch, MD    Brief Narrative: 68 y.o.femalewith medical history significant ofhypertension, pulmonary hypertension, diabetes, morbid obesity, obstructive sleep apnea presenting to the hospital for evaluation of shortness of breath and hypertension, work-up significant for hypoxia, volume overload, she was admitted for IV diuresis.  Patient with increased oxygen requirement 9/10, at 8 L nasal cannula, PCCM consulted, work-up significant for atelectasis and pulmonary edema.  Patient improved with IV diuresis, and pulmonary PT, renal function Fluctuating in the setting of diuresis, peaked at 2.3 on 9/16, secondary to volume depletion/hypotension, improving with gentle hydration.   Assessment & Plan:   Principal Problem:   Acute respiratory failure with hypoxia (HCC) Active Problems:   Essential hypertension, benign   OSA (obstructive sleep apnea)   Pulmonary edema   AKI (acute kidney injury) (HCC)  Acute hypoxic respiratory failure secondary to pulmonary edema - Secondary to volume overload, and atelectasis, patient with increased oxygen requirement to 8 L nasal cannula initially, this has significantly improved, see you on 2 L nasal cannula, I have weaned to room air which she has been tolerating so far. -Pulmonary input greatly appreciated, this is most likely in the setting of acute pulmonary edema, and poor inspiratory effort/atelectasis -Encouraged to use incentive spirometry, ambulate in the hallway with staff, and out of bed to chair. -VQ scan with no evidence of PE, heparin GTT has been stopped .  Acute on chronic diastolic CHF -Repeat echo this admission present preserved EF, but impaired relaxation -Requiring IV diuresis, so far she is -5.6 L during hospital admission, Lasix has been held in the setting of hypotension and worsening renal function. -Appears to be stable at this  time.  AKI on CKD 3 -Baseline creatinine is around 1, it did peak at 2.39/16, this is most likely due to hypotension and overdiuresis. -Improving on gentle hydration.  Currently appears to be euvolemic. Therefore IV fluids held. Monitor closely . -Antihypertensive medications were held 9/16. -Creatinine today (03/13/2019) improved to 1.59.  Essential hypertension -Patient with hypotension 9/15 overnight, where she required fluid boluses, her antihypertensive regimen was on hold including amlodipine, chlorthalidone, Lasix, ramipril and hydralazine. -Blood pressure later started to increase, resumed amlodipine for now.  Continue to monitor blood pressure and adjust medications as needed. Try to avoid low blood pressure to avoid worsening kidney functions.  OSA -CPAP at night  Diabetes mellitus -A1c is 8 , Outpatient Diabetes medications:Jardiance 10 mg daily, Glipizide XL 10 mg QAM, Metformin XR 1000 mg BID, Actos 30 mg -Overall her A1c showing good control as an outpatient, but during hospital stay she does require significant insulin dose ( no insulin requirement as an outpatient ), is currently on Lantus to 75 units daily, 6 with meals, and insulin sliding scale .   Code Status : Full  Family Communication  :  No family at bedside.  Disposition Plan  : Home in 1 to 2 days if continues to be stable  Consults  :  PCCM  Procedures  : None  DVT Prophylaxis  :   Subcutaneous Heparin    Subjective: Denies having any chest pain, nausea, vomiting, shortness of breath.  Lower extremity edema improving.  Asking for stool softeners.  Objective: Vitals:   03/12/19 2331 03/13/19 0300 03/13/19 0807 03/13/19 0812  BP: 128/60 134/67 (!) 153/61   Pulse: 75 72 72   Resp: 20 (!) 21    Temp: 98.2 F (36.8  C) 98.1 F (36.7 C) 97.8 F (36.6 C)   TempSrc: Oral Oral Oral   SpO2: 98% 95% 95% 96%  Weight:      Height:        Intake/Output Summary (Last 24 hours) at 03/13/2019  0824 Last data filed at 03/13/2019 0410 Gross per 24 hour  Intake 878 ml  Output 2300 ml  Net -1422 ml   Filed Weights   03/11/19 0546 03/12/19 0500 03/12/19 0529  Weight: 95.6 kg 94.9 kg 96.2 kg    Examination:  General exam: Alert, awake, oriented x3 Respiratory system: Decreased breath sounds lower lobes otherwise clear to auscultation Cardiovascular system: S1 & S2. No murmurs. pedal edema improving. Gastrointestinal system: Abdomen is obese, soft and nontender. Normal bowel sounds heard. Central nervous system: Alert and oriented. No focal neurological deficits. Psychiatry: Judgement and insight appear normal. Mood & affect appropriate.     Data Reviewed: I have personally reviewed following labs and imaging studies  CBC: Recent Labs  Lab 03/07/19 0512 03/08/19 0656 03/10/19 0352 03/11/19 0452 03/13/19 0438  WBC 12.3* 13.0* 15.6* 15.7* 11.4*  HGB 12.4 12.1 12.9 11.9* 12.1  HCT 37.3 35.2* 37.4 36.0 35.5*  MCV 87.8 85.9 85.4 88.2 86.8  PLT 403* 407* 468* 495* 407*   Basic Metabolic Panel: Recent Labs  Lab 03/09/19 0610 03/10/19 0352 03/11/19 0452 03/12/19 0738 03/13/19 0438  NA 133* 133* 131* 135 135  K 4.3 3.6 3.7 4.1 4.4  CL 97* 96* 97* 101 105  CO2 23 23 22 22 22   GLUCOSE 220* 127* 173* 83 65*  BUN 90* 98* 107* 109* 90*  CREATININE 1.80* 1.67* 2.32* 2.00* 1.59*  CALCIUM 9.8 9.7 9.2 9.4 9.4   GFR: Estimated Creatinine Clearance: 36.2 mL/min (A) (by C-G formula based on SCr of 1.59 mg/dL (H)). Liver Function Tests: No results for input(s): AST, ALT, ALKPHOS, BILITOT, PROT, ALBUMIN in the last 168 hours. No results for input(s): LIPASE, AMYLASE in the last 168 hours. No results for input(s): AMMONIA in the last 168 hours. Coagulation Profile: No results for input(s): INR, PROTIME in the last 168 hours. Cardiac Enzymes: No results for input(s): CKTOTAL, CKMB, CKMBINDEX, TROPONINI in the last 168 hours. BNP (last 3 results) No results for input(s):  PROBNP in the last 8760 hours. HbA1C: No results for input(s): HGBA1C in the last 72 hours. CBG: Recent Labs  Lab 03/12/19 1130 03/12/19 1641 03/12/19 2134 03/12/19 2212 03/13/19 0806  GLUCAP 259* 267* 58* 114* 101*   Lipid Profile: No results for input(s): CHOL, HDL, LDLCALC, TRIG, CHOLHDL, LDLDIRECT in the last 72 hours. Thyroid Function Tests: No results for input(s): TSH, T4TOTAL, FREET4, T3FREE, THYROIDAB in the last 72 hours. Anemia Panel: No results for input(s): VITAMINB12, FOLATE, FERRITIN, TIBC, IRON, RETICCTPCT in the last 72 hours. Sepsis Labs: No results for input(s): PROCALCITON, LATICACIDVEN in the last 168 hours.  Recent Results (from the past 240 hour(s))  SARS Coronavirus 2 [facility](Hospital order, Performed in [facility] hospital lab) Nasopharyngeal Nasopharyngeal Swab     Status: None   Collection Time: 03/04/19 12:01 AM   Specimen: Nasopharyngeal Swab  Result Value Ref Range Status   SARS Coronavirus 2 NEGATIVE NEGATIVE Final    Comment: (NOTE) If result is NEGATIVE SARS-CoV-2 target nucleic acids are NOT DETECTED. The SARS-CoV-2 RNA is generally detectable in upper and lower  respiratory specimens during the acute phase of infection. The lowest  concentration of SARS-CoV-2 viral copies this assay can detect is 250  copies /  mL. A negative result does not preclude SARS-CoV-2 infection  and should not be used as the sole basis for treatment or other  patient management decisions.  A negative result may occur with  improper specimen collection / handling, submission of specimen other  than nasopharyngeal swab, presence of viral mutation(s) within the  areas targeted by this assay, and inadequate number of viral copies  (<250 copies / mL). A negative result must be combined with clinical  observations, patient history, and epidemiological information. If result is POSITIVE SARS-CoV-2 target nucleic acids are DETECTED. The SARS-CoV-2 RNA is generally detectable  in upper and lower  respiratory specimens dur ing the acute phase of infection.  Positive  results are indicative of active infection with SARS-CoV-2.  Clinical  correlation with patient history and other diagnostic information is  necessary to determine patient infection status.  Positive results do  not rule out bacterial infection or co-infection with other viruses. If result is PRESUMPTIVE POSTIVE SARS-CoV-2 nucleic acids MAY BE PRESENT.   A presumptive positive result was obtained on the submitted specimen  and confirmed on repeat testing.  While 2019 novel coronavirus  (SARS-CoV-2) nucleic acids may be present in the submitted sample  additional confirmatory testing may be necessary for epidemiological  and / or clinical management purposes  to differentiate between  SARS-CoV-2 and other Sarbecovirus currently known to infect humans.  If clinically indicated additional testing with an alternate test  methodology [phone]) is advised. The SARS-CoV-2 RNA is generally  detectable in upper and lower respiratory sp ecimens during the acute  phase of infection. The expected result is Negative. Fact Sheet for Patients:  BoilerBrush.com.cy Fact Sheet for Healthcare Providers: https://pope.com/ This test is not yet approved or cleared by the [place] FDA and has been authorized for detection and/or diagnosis of SARS-CoV-2 by FDA under an Emergency Use Authorization (EUA).  This EUA will remain in effect (meaning this test can be used) for the duration of the COVID-19 declaration under Section 564(b)(1) of the Act, 21 U.S.C. section 360bbb-3(b)(1), unless the authorization is terminated or revoked sooner. Performed at [facility], [address] [place], [place] [address]   Urine Culture     Status: Abnormal   Collection Time: 03/04/19  8:47 AM   Specimen: Urine, Random  Result Value Ref Range Status   Specimen Description  URINE, RANDOM  Final   Special Requests NONE  Final   Culture (A)  Final    <10,000 COLONIES/mL INSIGNIFICANT GROWTH Performed at [facility], [address], [place], [place] [address]    Report Status 03/05/2019 FINAL  Final  SARS Coronavirus 2 [facility] order, Performed in [facility] hospital lab) Nasopharyngeal Nasopharyngeal Swab     Status: None   Collection Time: 03/05/19  3:30 PM   Specimen: Nasopharyngeal Swab  Result Value Ref Range Status   SARS Coronavirus 2 NEGATIVE NEGATIVE Final    Comment: (NOTE) If result is NEGATIVE SARS-CoV-2 target nucleic acids are NOT DETECTED. The SARS-CoV-2 RNA is generally detectable in upper and lower  respiratory specimens during the acute phase of infection. The lowest  concentration of SARS-CoV-2 viral copies this assay can detect is 250  copies / mL. A negative result does not preclude SARS-CoV-2 infection  and should not be used as the sole basis for treatment or other  patient management decisions.  A negative result may occur with  improper specimen collection / handling, submission of specimen other  than nasopharyngeal swab,  presence of viral mutation(s) within the  areas targeted by this assay, and inadequate number of viral copies  (<250 copies / mL). A negative result must be combined with clinical  observations, patient history, and epidemiological information. If result is POSITIVE SARS-CoV-2 target nucleic acids are DETECTED. The SARS-CoV-2 RNA is generally detectable in upper and lower  respiratory specimens dur ing the acute phase of infection.  Positive  results are indicative of active infection with SARS-CoV-2.  Clinical  correlation with patient history and other diagnostic information is  necessary to determine patient infection status.  Positive results do  not rule out bacterial infection or co-infection with other viruses. If result is PRESUMPTIVE POSTIVE SARS-CoV-2 nucleic acids MAY BE PRESENT.   A  presumptive positive result was obtained on the submitted specimen  and confirmed on repeat testing.  While 2019 novel coronavirus  (SARS-CoV-2) nucleic acids may be present in the submitted sample  additional confirmatory testing may be necessary for epidemiological  and / or clinical management purposes  to differentiate between  SARS-CoV-2 and other Sarbecovirus currently known to infect humans.  If clinically indicated additional testing with an alternate test  methodology [phone]) is advised. The SARS-CoV-2 RNA is generally  detectable in upper and lower respiratory sp ecimens during the acute  phase of infection. The expected result is Negative. Fact Sheet for Patients:  BoilerBrush.com.cyhttps://www.fda.gov/media/136312/download Fact Sheet for Healthcare Providers: https://pope.com/https://www.fda.gov/media/136313/download This test is not yet approved or cleared by the [place] FDA and has been authorized for detection and/or diagnosis of SARS-CoV-2 by FDA under an Emergency Use Authorization (EUA).  This EUA will remain in effect (meaning this test can be used) for the duration of the COVID-19 declaration under Section 564(b)(1) of the Act, 21 U.S.C. section 360bbb-3(b)(1), unless the authorization is terminated or revoked sooner. Performed at [facility], [address] [place], [place] [address]   Respiratory Panel by PCR     Status: None   Collection Time: 03/05/19  6:25 PM   Specimen: Nasopharyngeal Swab; Respiratory  Result Value Ref Range Status   Adenovirus NOT DETECTED NOT DETECTED Final   Coronavirus 229E NOT DETECTED NOT DETECTED Final    Comment: (NOTE) The Coronavirus on the Respiratory Panel, DOES NOT test for the novel  Coronavirus (2019 nCoV)    Coronavirus HKU1 NOT DETECTED NOT DETECTED Final   Coronavirus NL63 NOT DETECTED NOT DETECTED Final   Coronavirus OC43 NOT DETECTED NOT DETECTED Final   Metapneumovirus NOT DETECTED NOT DETECTED Final   Rhinovirus / Enterovirus NOT  DETECTED NOT DETECTED Final   Influenza A NOT DETECTED NOT DETECTED Final   Influenza B NOT DETECTED NOT DETECTED Final   Parainfluenza Virus 1 NOT DETECTED NOT DETECTED Final   Parainfluenza Virus 2 NOT DETECTED NOT DETECTED Final   Parainfluenza Virus 3 NOT DETECTED NOT DETECTED Final   Parainfluenza Virus 4 NOT DETECTED NOT DETECTED Final   Respiratory Syncytial Virus NOT DETECTED NOT DETECTED Final   Bordetella pertussis NOT DETECTED NOT DETECTED Final   Chlamydophila pneumoniae NOT DETECTED NOT DETECTED Final   Mycoplasma pneumoniae NOT DETECTED NOT DETECTED Final    Comment: Performed at [facility], [address] [place], [place] [address]         Radiology Studies: No results found.      Scheduled Meds:  amLODipine  10 mg Oral Daily   atorvastatin  40 mg Oral q1800   cholecalciferol  2,000 Units Oral Daily   fluticasone  2 spray Each Nare Daily   glipiZIDE  10 mg Oral Q breakfast   heparin injection (subcutaneous)  5,000 Units Subcutaneous Q8H   insulin aspart  0-15 Units Subcutaneous TID WC   insulin aspart  0-5 Units Subcutaneous QHS   insulin aspart  6 Units Subcutaneous TID WC   insulin glargine  25 Units Subcutaneous QHS   insulin glargine  50 Units Subcutaneous Daily   loratadine  10 mg Oral Daily   Melatonin  3 mg Oral QHS   multivitamin  1 tablet Oral Daily   omega-3 acid ethyl esters  1 g Oral Daily   sertraline  50 mg Oral Daily   umeclidinium-vilanterol  1 puff Inhalation Daily   Continuous Infusions:   LOS: 9 days      Yaakov Guthrie, MD [facility] Pager on amion  If 7PM-7AM, please contact night-coverage [email or web address] Password [facility] 03/13/2019, 8:24 AM

## 2019-03-13 NOTE — Discharge Instructions (Signed)

## 2019-03-13 NOTE — Progress Notes (Signed)
Physical Therapy Treatment Patient Details Name: Kayla MirzaVickie Brown MRN: [id number] DOB: 06/16/1951 Today's Date: 03/13/2019    History of Present Illness 68 y.o. female with medical history significant of hypertension, pulmonary hypertension, diabetes, morbid obesity, obstructive sleep apnea presenting 03/03/19 to the hospital for evaluation of shortness of breath and hypertension.  Patient states her blood pressure has been high for very long time with SBP 220 in ED. Chest x-ray showing cardiac enlargement and mild pulmonary vascular congestion. Findings thought to reflect early CHF. V/Q negative. COVID-19 negative. CBGs as high as 600.     PT Comments    Patient received resting in bed. Reports she is feeling discouraged. Agrees to PT session. Patient reports she feels like she could have a BM- assisted to bathroom. Patient then ambulated 150 feet with rw, several brief standing rest breaks during walk due to fatigue. O2 sats remained > 97% throughout session on room air. Patient will benefit from continued skilled PT to improve strength, activity tolerance and independence.         Follow Up Recommendations  Home health PT     Equipment Recommendations  None recommended by PT    Recommendations for Other Services       Precautions / Restrictions Precautions Precautions: Fall Restrictions Weight Bearing Restrictions: No    Mobility  Bed Mobility Overal bed mobility: Modified Independent Bed Mobility: Supine to Sit     Supine to sit: Modified independent (Device/Increase time);HOB elevated     General bed mobility comments: extra time and effort, no physical assist  Transfers Overall transfer level: Needs assistance Equipment used: Rolling walker (2 wheeled) Transfers: Sit to/from Stand Sit to Stand: Supervision         General transfer comment: PT assisting with telemetry  Ambulation/Gait Ambulation/Gait assistance: Min guard Gait Distance (Feet): 150 Feet Assistive  device: Rolling walker (2 wheeled) Gait Pattern/deviations: Step-through pattern;Trunk flexed;Wide base of support Gait velocity: decreased   General Gait Details: Patient ambulated with slow steady pace, standing rest breaks as needed due to fatigue.   Stairs             Wheelchair Mobility    Modified Rankin (Stroke Patients Only)       Balance Overall balance assessment: Needs assistance Sitting-balance support: Feet supported Sitting balance-Leahy Scale: Good     Standing balance support: Bilateral upper extremity supported;During functional activity Standing balance-Leahy Scale: Fair                              Cognition Arousal/Alertness: Awake/alert Behavior During Therapy: WFL for tasks assessed/performed Overall Cognitive Status: Within Functional Limits for tasks assessed                                        Exercises      General Comments        Pertinent Vitals/Pain Pain Assessment: No/denies pain    Home Living                      Prior Function            PT Goals (current goals can now be found in the care plan section) Acute Rehab PT Goals Patient Stated Goal: home and feel better PT Goal Formulation: With patient Time For Goal Achievement: 03/20/19 Potential to Achieve Goals: Good Progress towards  PT goals: Progressing toward goals    Frequency    Min 3X/week      PT Plan Current plan remains appropriate    Co-evaluation              AM-PAC PT "6 Clicks" Mobility   Outcome Measure  Help needed turning from your back to your side while in a flat bed without using bedrails?: None Help needed moving from lying on your back to sitting on the side of a flat bed without using bedrails?: None Help needed moving to and from a bed to a chair (including a wheelchair)?: A Little Help needed standing up from a chair using your arms (e.g., wheelchair or bedside chair)?: A Little Help  needed to walk in hospital room?: A Little Help needed climbing 3-5 steps with a railing? : A Lot 6 Click Score: 19    End of Session Equipment Utilized During Treatment: Gait belt Activity Tolerance: Patient tolerated treatment well;Patient limited by fatigue Patient left: in chair;with call bell/phone within reach Nurse Communication: Mobility status PT Visit Diagnosis: Muscle weakness (generalized) (M62.81);Difficulty in walking, not elsewhere classified (R26.2)     Time: 3361-2244 PT Time Calculation (min) (ACUTE ONLY): 29 min  Charges:  $Gait Training: 23-37 mins                     [employer], PT, [employer] 03/13/19,11:03 AM

## 2019-03-14 LAB — BASIC METABOLIC PANEL
Anion gap: 10 (ref 5–15)
BUN: 69 mg/dL — ABNORMAL HIGH (ref 8–23)
CO2: 20 mmol/L — ABNORMAL LOW (ref 22–32)
Calcium: 9.6 mg/dL (ref 8.9–10.3)
Chloride: 104 mmol/L (ref 98–111)
Creatinine, Ser: 1.3 mg/dL — ABNORMAL HIGH (ref 0.44–1.00)
GFR calc Af Amer: 50 mL/min — ABNORMAL LOW (ref >60)
GFR calc non Af Amer: 43 mL/min — ABNORMAL LOW (ref >60)
Glucose, Bld: 137 mg/dL — ABNORMAL HIGH (ref 70–99)
Potassium: 4.7 mmol/L (ref 3.5–5.1)
Sodium: 134 mmol/L — ABNORMAL LOW (ref 135–145)

## 2019-03-14 LAB — CBC
HCT: 36 % (ref 36.0–46.0)
Hemoglobin: 11.8 g/dL — ABNORMAL LOW (ref 12.0–15.0)
MCH: 28.9 pg (ref 26.0–34.0)
MCHC: 32.8 g/dL (ref 30.0–36.0)
MCV: 88.2 fL (ref 80.0–100.0)
Platelets: 428 10*3/uL — ABNORMAL HIGH (ref 150–400)
RBC: 4.08 MIL/uL (ref 3.87–5.11)
RDW: 14.4 % (ref 11.5–15.5)
WBC: 11 10*3/uL — ABNORMAL HIGH (ref 4.0–10.5)
nRBC: 0 % (ref 0.0–0.2)

## 2019-03-14 LAB — GLUCOSE, CAPILLARY
Glucose-Capillary: 100 mg/dL — ABNORMAL HIGH (ref 70–99)
Glucose-Capillary: 299 mg/dL — ABNORMAL HIGH (ref 70–99)
Glucose-Capillary: 220 mg/dL — ABNORMAL HIGH (ref 70–99)
Glucose-Capillary: 117 mg/dL — ABNORMAL HIGH (ref 70–99)

## 2019-03-14 MED ORDER — ZOLPIDEM TARTRATE 5 MG PO TABS
5.0000 mg | ORAL_TABLET | Freq: Once | ORAL | Status: AC
  Administered 2019-03-14: 5 mg via ORAL
  Filled 2019-03-14: qty 1

## 2019-03-14 MED ORDER — INSULIN ASPART 100 UNIT/ML ~~LOC~~ SOLN
0.0000 [IU] | Freq: Three times a day (TID) | SUBCUTANEOUS | Status: DC
  Administered 2019-03-14: 3 [IU] via SUBCUTANEOUS
  Administered 2019-03-14: 5 [IU] via SUBCUTANEOUS
  Administered 2019-03-15: 3 [IU] via SUBCUTANEOUS
  Administered 2019-03-15: 9 [IU] via SUBCUTANEOUS

## 2019-03-14 MED ORDER — INSULIN GLARGINE 100 UNIT/ML ~~LOC~~ SOLN
10.0000 [IU] | Freq: Two times a day (BID) | SUBCUTANEOUS | Status: DC
  Administered 2019-03-14 – 2019-03-16 (×5): 10 [IU] via SUBCUTANEOUS
  Filled 2019-03-14 (×6): qty 0.1

## 2019-03-14 NOTE — Progress Notes (Signed)
PROGRESS NOTE    Kayla Brown  VOP:[id number] DOB: 09-14-1950 DOA: 03/03/2019 PCP: Sheliah Hatch, MD    Brief Narrative: 68 y.o.femalewith medical history significant ofhypertension, pulmonary hypertension, diabetes, morbid obesity, obstructive sleep apnea presenting to the hospital for evaluation of shortness of breath and hypertension, work-up significant for hypoxia, volume overload, she was admitted for IV diuresis.  Patient with increased oxygen requirement 9/10, at 8 L nasal cannula, PCCM consulted, work-up significant for atelectasis and pulmonary edema.  Patient improved with IV diuresis, and pulmonary PT, renal function Fluctuating in the setting of diuresis, peaked at 2.3 on 9/16, secondary to volume depletion/hypotension, improving with gentle hydration.  03/14/2019: Gentle with hydration has been discontinued.  Serum creatinine is 1.3 today.  Patient is stable from cardiac point.  However, patient's blood sugar has been uncontrolled.  Blood sugar of 54 was noted around 9 p.m. last night.  Will discontinue Glucotrol XL.  Will change subcutaneous Lantus from 15 units in the morning and 25 units in the morning to 10 units twice daily.  Will change moderate/high dose sliding scale insulin coverage to sensitive SSI, and avoid nighttime coverage.  Will monitor blood sugar closely and adjust accordingly.  Will consult diabetic resource nurse.  Likely, patient will be discharged back home when blood sugar control is optimized.  The management plan was communicated to patient and patient's husband.   Assessment & Plan:   Principal Problem:   Acute respiratory failure with hypoxia (HCC) Active Problems:   Essential hypertension, benign   OSA (obstructive sleep apnea)   Pulmonary edema   AKI (acute kidney injury) (HCC)  Acute hypoxic respiratory failure secondary to acute on chronic diastolic congestive heart failure: - Secondary to volume overload, and atelectasis, patient with  increased oxygen requirement to 8 L nasal cannula initially, this has significantly improved, see you on 2 L nasal cannula, I have weaned to room air which she has been tolerating so far. -Pulmonary input greatly appreciated, this is most likely in the setting of acute pulmonary edema, and poor inspiratory effort/atelectasis -Encouraged to use incentive spirometry, ambulate in the hallway with staff, and out of bed to chair. -VQ scan with no evidence of PE, heparin GTT has been stopped. 03/14/2019: Acute on chronic diastolic congestive heart failure has been optimized.  Patient's respiratory status is back to baseline.  Acute on chronic diastolic CHF -Repeat echo this admission present preserved EF, but impaired relaxation -Requiring IV diuresis, so far she is -5.6 L during hospital admission, Lasix has been held in the setting of hypotension and worsening renal function. -Appears to be stable at this time. 03/14/2019: This is optimized.  AKI on CKD 3 -Baseline creatinine is around 1, it did peak at 2.39/16, this is most likely due to hypotension and overdiuresis. -Improving on gentle hydration.  Currently appears to be euvolemic. Therefore IV fluids held. Monitor closely . -Antihypertensive medications were held 9/16. -Creatinine today (03/13/2019) improved to 1.59. 03/14/2019: AKI is close to baseline.  Serum creatinine is 1.3 today.  Essential hypertension -Patient with hypotension 9/15 overnight, where she required fluid boluses, her antihypertensive regimen was on hold including amlodipine, chlorthalidone, Lasix, ramipril and hydralazine. -Blood pressure later started to increase, resumed amlodipine for now.  Continue to monitor blood pressure and adjust medications as needed. Try to avoid low blood pressure to avoid worsening kidney functions. 03/14/2019: Blood pressure is optimized.  Last blood pressure was 108/80 mmHg.  OSA -CPAP at night  Diabetes mellitus -A1c is 8 , Outpatient  Diabetes medications:Jardiance 10 mg daily, Glipizide XL 10 mg QAM, Metformin XR 1000 mg BID, Actos 30 mg -Overall her A1c showing good control as an outpatient, but during hospital stay she does require significant insulin dose ( no insulin requirement as an outpatient ), is currently on Lantus to 75 units daily, 6 with meals, and insulin sliding scale . 03/14/2019: Blood sugar has been uncontrolled.  Blood sugar was 54 last night.  Will discontinue Glucotrol XL.  Will change the dose of subcutaneous Lantus to 10 units twice daily.  Will consult diabetes educator.  Likely, patient will be discharged back home once blood sugar control is optimized.  Code Status : Full  Family Communication  :  No family at bedside.  Disposition Plan  : Home in 1 to 2 days if continues to be stable  Consults  :  PCCM  Procedures  : None  DVT Prophylaxis  :   Subcutaneous Heparin    Subjective: No shortness of breath No chest pain. Reports significant insomnia, despite being on melatonin.  Objective: Vitals:   03/13/19 2333 03/14/19 0352 03/14/19 0733 03/14/19 0805  BP: (!) 158/71 (!) 148/63  (!) 154/60  Pulse: 92 76  78  Resp: 20 17    Temp: 99.6 F (37.6 C) 98.2 F (36.8 C)  97.9 F (36.6 C)  TempSrc: Oral Oral  Oral  SpO2: 94% 97% 95% 95%  Weight:  95.9 kg    Height:        Intake/Output Summary (Last 24 hours) at 03/14/2019 1034 Last data filed at 03/14/2019 0900 Gross per 24 hour  Intake 240 ml  Output 650 ml  Net -410 ml   Filed Weights   03/12/19 0500 03/12/19 0529 03/14/19 0352  Weight: 94.9 kg 96.2 kg 95.9 kg    Examination:  General exam: Alert, awake, oriented x3 Respiratory system: Clear to auscultation.   Cardiovascular system: S1 & S2. Gastrointestinal system: Abdomen is morbidly obese, soft and nontender. Normal bowel sounds heard. Central nervous system: Alert and oriented. No focal neurological deficits. Extremities: Very minimal bilateral ankle edema.  The  sides of right lower extremity is chronically greater than left lower extremity.      Data Reviewed: I have personally reviewed following labs and imaging studies  CBC: Recent Labs  Lab 03/08/19 0656 03/10/19 0352 03/11/19 0452 03/13/19 0438 03/14/19 0448  WBC 13.0* 15.6* 15.7* 11.4* 11.0*  HGB 12.1 12.9 11.9* 12.1 11.8*  HCT 35.2* 37.4 36.0 35.5* 36.0  MCV 85.9 85.4 88.2 86.8 88.2  PLT 407* 468* 495* 407* 762*   Basic Metabolic Panel: Recent Labs  Lab 03/10/19 0352 03/11/19 0452 03/12/19 0738 03/13/19 0438 03/14/19 0448  NA 133* 131* 135 135 134*  K 3.6 3.7 4.1 4.4 4.7  CL 96* 97* 101 105 104  CO2 23 22 22 22  20*  GLUCOSE 127* 173* 83 65* 137*  BUN 98* 107* 109* 90* 69*  CREATININE 1.67* 2.32* 2.00* 1.59* 1.30*  CALCIUM 9.7 9.2 9.4 9.4 9.6   GFR: Estimated Creatinine Clearance: 44.2 mL/min (A) (by C-G formula based on SCr of 1.3 mg/dL (H)). Liver Function Tests: No results for input(s): AST, ALT, ALKPHOS, BILITOT, PROT, ALBUMIN in the last 168 hours. No results for input(s): LIPASE, AMYLASE in the last 168 hours. No results for input(s): AMMONIA in the last 168 hours. Coagulation Profile: No results for input(s): INR, PROTIME in the last 168 hours. Cardiac Enzymes: No results for input(s): CKTOTAL, CKMB, CKMBINDEX, TROPONINI in the last  168 hours. BNP (last 3 results) No results for input(s): PROBNP in the last 8760 hours. HbA1C: No results for input(s): HGBA1C in the last 72 hours. CBG: Recent Labs  Lab 03/13/19 1129 03/13/19 1621 03/13/19 2117 03/13/19 2235 03/14/19 0804  GLUCAP 114* 129* 54* 153* 100*   Lipid Profile: No results for input(s): CHOL, HDL, LDLCALC, TRIG, CHOLHDL, LDLDIRECT in the last 72 hours. Thyroid Function Tests: No results for input(s): TSH, T4TOTAL, FREET4, T3FREE, THYROIDAB in the last 72 hours. Anemia Panel: No results for input(s): VITAMINB12, FOLATE, FERRITIN, TIBC, IRON, RETICCTPCT in the last 72 hours. Sepsis Labs: No  results for input(s): PROCALCITON, LATICACIDVEN in the last 168 hours.  Recent Results (from the past 240 hour(s))  SARS Coronavirus 2 [facility](Hospital order, Performed in [facility]) Nasopharyngeal Nasopharyngeal Swab     Status: None   Collection Time: 03/05/19  3:30 PM   Specimen: Nasopharyngeal Swab  Result Value Ref Range Status   SARS Coronavirus 2 NEGATIVE NEGATIVE Final    Comment: (NOTE) If result is NEGATIVE SARS-CoV-2 target nucleic acids are NOT DETECTED. The SARS-CoV-2 RNA is generally detectable in upper and lower  respiratory specimens during the acute phase of infection. The lowest  concentration of SARS-CoV-2 viral copies this assay can detect is 250  copies / mL. A negative result does not preclude SARS-CoV-2 infection  and should not be used as the sole basis for treatment or other  patient management decisions.  A negative result may occur with  improper specimen collection / handling, submission of specimen other  than nasopharyngeal swab, presence of viral mutation(s) within the  areas targeted by this assay, and inadequate number of viral copies  (<250 copies / mL). A negative result must be combined with clinical  observations, patient history, and epidemiological information. If result is POSITIVE SARS-CoV-2 target nucleic acids are DETECTED. The SARS-CoV-2 RNA is generally detectable in upper and lower  respiratory specimens dur ing the acute phase of infection.  Positive  results are indicative of active infection with SARS-CoV-2.  Clinical  correlation with patient history and other diagnostic information is  necessary to determine patient infection status.  Positive results do  not rule out bacterial infection or co-infection with other viruses. If result is PRESUMPTIVE POSTIVE SARS-CoV-2 nucleic acids MAY BE PRESENT.   A presumptive positive result was obtained on the submitted specimen  and confirmed on repeat testing.  While 2019 novel  coronavirus  (SARS-CoV-2) nucleic acids may be present in the submitted sample  additional confirmatory testing may be necessary for epidemiological  and / or clinical management purposes  to differentiate between  SARS-CoV-2 and other Sarbecovirus currently known to infect humans.  If clinically indicated additional testing with an alternate test  methodology [phone]([id number]) is advised. The SARS-CoV-2 RNA is generally  detectable in upper and lower respiratory sp ecimens during the acute  phase of infection. The expected result is Negative. Fact Sheet for Patients:  BoilerBrush.com.cyhttps://www.fda.gov/media/136312/download Fact Sheet for Healthcare Providers: https://pope.com/https://www.fda.gov/media/136313/download This test is not yet approved or cleared by the [place] FDA and has been authorized for detection and/or diagnosis of SARS-CoV-2 by FDA under an Emergency Use Authorization (EUA).  This EUA will remain in effect (meaning this test can be used) for the duration of the COVID-19 declaration under Section 564(b)(1) of the Act, 21 U.S.C. section 360bbb-3(b)(1), unless the authorization is terminated or revoked sooner. Performed at [facility], [address] [place], [place] [address]   Respiratory Panel  by PCR     Status: None   Collection Time: 03/05/19  6:25 PM   Specimen: Nasopharyngeal Swab; Respiratory  Result Value Ref Range Status   Adenovirus NOT DETECTED NOT DETECTED Final   Coronavirus 229E NOT DETECTED NOT DETECTED Final    Comment: (NOTE) The Coronavirus on the Respiratory Panel, DOES NOT test for the novel  Coronavirus (2019 nCoV)    Coronavirus HKU1 NOT DETECTED NOT DETECTED Final   Coronavirus NL63 NOT DETECTED NOT DETECTED Final   Coronavirus OC43 NOT DETECTED NOT DETECTED Final   Metapneumovirus NOT DETECTED NOT DETECTED Final   Rhinovirus / Enterovirus NOT DETECTED NOT DETECTED Final   Influenza A NOT DETECTED NOT DETECTED Final   Influenza B NOT DETECTED NOT DETECTED Final    Parainfluenza Virus 1 NOT DETECTED NOT DETECTED Final   Parainfluenza Virus 2 NOT DETECTED NOT DETECTED Final   Parainfluenza Virus 3 NOT DETECTED NOT DETECTED Final   Parainfluenza Virus 4 NOT DETECTED NOT DETECTED Final   Respiratory Syncytial Virus NOT DETECTED NOT DETECTED Final   Bordetella pertussis NOT DETECTED NOT DETECTED Final   Chlamydophila pneumoniae NOT DETECTED NOT DETECTED Final   Mycoplasma pneumoniae NOT DETECTED NOT DETECTED Final    Comment: Performed at [facility], [address] [place], [place] [address]         Radiology Studies: No results found.      Scheduled Meds: . amLODipine  10 mg Oral Daily  . atorvastatin  40 mg Oral q1800  . cholecalciferol  2,000 Units Oral Daily  . docusate sodium  100 mg Oral BID  . fluticasone  2 spray Each Nare Daily  . glipiZIDE  10 mg Oral Q breakfast  . heparin injection (subcutaneous)  5,000 Units Subcutaneous Q8H  . insulin aspart  0-15 Units Subcutaneous TID WC  . insulin aspart  0-5 Units Subcutaneous QHS  . insulin aspart  6 Units Subcutaneous TID WC  . insulin glargine  25 Units Subcutaneous QHS  . insulin glargine  50 Units Subcutaneous Daily  . loratadine  10 mg Oral Daily  . Melatonin  3 mg Oral QHS  . multivitamin  1 tablet Oral Daily  . omega-3 acid ethyl esters  1 g Oral Daily  . sertraline  50 mg Oral Daily  . umeclidinium-vilanterol  1 puff Inhalation Daily   Continuous Infusions:   LOS: 10 days      Barnetta ChapelSylvester I Yassmin Binegar, MD [facility] Pager on amion  If 7PM-7AM, please contact night-coverage [email or web address] Password [email or web address] 03/14/2019, 10:34 AM

## 2019-03-15 LAB — GLUCOSE, CAPILLARY
Glucose-Capillary: 70 mg/dL (ref 70–99)
Glucose-Capillary: 212 mg/dL — ABNORMAL HIGH (ref 70–99)
Glucose-Capillary: 167 mg/dL — ABNORMAL HIGH (ref 70–99)
Glucose-Capillary: 373 mg/dL — ABNORMAL HIGH (ref 70–99)
Glucose-Capillary: 180 mg/dL — ABNORMAL HIGH (ref 70–99)

## 2019-03-15 LAB — RENAL FUNCTION PANEL
Albumin: 3 g/dL — ABNORMAL LOW (ref 3.5–5.0)
Anion gap: 10 (ref 5–15)
BUN: 52 mg/dL — ABNORMAL HIGH (ref 8–23)
CO2: 19 mmol/L — ABNORMAL LOW (ref 22–32)
Calcium: 9.6 mg/dL (ref 8.9–10.3)
Chloride: 106 mmol/L (ref 98–111)
Creatinine, Ser: 1.25 mg/dL — ABNORMAL HIGH (ref 0.44–1.00)
GFR calc Af Amer: 52 mL/min — ABNORMAL LOW (ref >60)
GFR calc non Af Amer: 45 mL/min — ABNORMAL LOW (ref >60)
Glucose, Bld: 75 mg/dL (ref 70–99)
Phosphorus: 3.9 mg/dL (ref 2.5–4.6)
Potassium: 4.5 mmol/L (ref 3.5–5.1)
Sodium: 135 mmol/L (ref 135–145)

## 2019-03-15 MED ORDER — CARVEDILOL 3.125 MG PO TABS
3.1250 mg | ORAL_TABLET | Freq: Two times a day (BID) | ORAL | Status: DC
  Administered 2019-03-15 – 2019-03-16 (×2): 3.125 mg via ORAL
  Filled 2019-03-15 (×2): qty 1

## 2019-03-15 NOTE — Progress Notes (Signed)
PROGRESS NOTE    Kayla Brown  FXO:[id number] DOB: [birth year] DOA: 03/03/2019 PCP: Sheliah Hatch, MD    Brief Narrative: 68 y.o.femalewith medical history significant ofhypertension, pulmonary hypertension, diabetes, morbid obesity, obstructive sleep apnea presenting to the hospital for evaluation of shortness of breath and hypertension, work-up significant for hypoxia, volume overload, she was admitted for IV diuresis.  Patient with increased oxygen requirement 9/10, at 8 L nasal cannula, PCCM consulted, work-up significant for atelectasis and pulmonary edema.  Patient improved with IV diuresis, and pulmonary PT, renal function Fluctuating in the setting of diuresis, peaked at 2.3 on 9/16, secondary to volume depletion/hypotension, improving with gentle hydration.  03/14/2019: Gentle with hydration has been discontinued.  Serum creatinine is 1.3 today.  Patient is stable from cardiac point.  However, patient's blood sugar has been uncontrolled.  Blood sugar of 54 was noted around 9 p.m. last night.  Will discontinue Glucotrol XL.  Will change subcutaneous Lantus from 15 units in the morning and 25 units in the morning to 10 units twice daily.  Will change moderate/high dose sliding scale insulin coverage to sensitive SSI, and avoid nighttime coverage.  Will monitor blood sugar closely and adjust accordingly.  Will consult diabetic resource nurse.  Likely, patient will be discharged back home when blood sugar control is optimized.  The management plan was communicated to patient and patient's husband.  03/15/2019: Patient seen alongside patient's nurse.  Blood sugar control is improving.  Will await input from diabetic educator in the morning.  Patient may need referral to an endocrinologist.  According to the patient, she has been a diabetic for 19 years.  Patient was not on insulin prior to presentation.  HbA1c was 8% on 03/06/2019.  Patient may have been on empagliflozin, metformin and  pioglitazone prior to admission.  I guess a reasonable approach may be to discontinue pioglitazone on discharge and add subcutaneous Lantus at nighttime to patient's home antidiabetic regimen.  GFR today is 45 mils per minute, will be very careful with metformin as well.  AKI is slowly resolving.  Assessment & Plan:   Principal Problem:   Acute respiratory failure with hypoxia (HCC) Active Problems:   Essential hypertension, benign   OSA (obstructive sleep apnea)   Pulmonary edema   AKI (acute kidney injury) (HCC)  Acute hypoxic respiratory failure secondary to acute on chronic diastolic congestive heart failure: - Secondary to volume overload, and atelectasis, patient with increased oxygen requirement to 8 L nasal cannula initially, this has significantly improved, see you on 2 L nasal cannula, I have weaned to room air which she has been tolerating so far. -Pulmonary input greatly appreciated, this is most likely in the setting of acute pulmonary edema, and poor inspiratory effort/atelectasis -Encouraged to use incentive spirometry, ambulate in the hallway with staff, and out of bed to chair. -VQ scan with no evidence of PE, heparin GTT has been stopped. 03/14/2019: Acute on chronic diastolic congestive heart failure has been optimized.  Patient's respiratory status is back to baseline. 03/15/2019: Consider discontinuing Actos on discharge.  CHF has remained stable.  Acute on chronic diastolic CHF -Repeat echo this admission present preserved EF, but impaired relaxation -Requiring IV diuresis, so far she is -5.6 L during hospital admission, Lasix has been held in the setting of hypotension and worsening renal function. -Appears to be stable at this time. 03/15/2019: This is optimized.  Add Coreg 3.125 mg p.o. twice daily.  AKI on CKD 3 -Baseline creatinine is around 1, it did  peak at 2.39/16, this is most likely due to hypotension and overdiuresis. -Improving on gentle hydration.   Currently appears to be euvolemic. Therefore IV fluids held. Monitor closely . -Antihypertensive medications were held 9/16. -Creatinine today (03/13/2019) improved to 1.59. 03/14/2019: AKI is close to baseline.  Serum creatinine is 1.3 today. 03/15/2019: AKI has resolved significantly.  Serum creatinine is 1.25.  Essential hypertension -Patient with hypotension 9/15 overnight, where she required fluid boluses, her antihypertensive regimen was on hold including amlodipine, chlorthalidone, Lasix, ramipril and hydralazine. -Blood pressure later started to increase, resumed amlodipine for now.  Continue to monitor blood pressure and adjust medications as needed. Try to avoid low blood pressure to avoid worsening kidney functions. 03/14/2019: Blood pressure is optimized.  Last blood pressure was 108/80 mmHg. 03/15/2019: Blood pressure today is 163/67 mmHg with heart rate of 76 bpm.  Will add Coreg 3.125 Mg p.o. twice daily.  OSA -CPAP at night  Diabetes mellitus -A1c is 8 , Outpatient Diabetes medications:Jardiance 10 mg daily, Glipizide XL 10 mg QAM, Metformin XR 1000 mg BID, Actos 30 mg -Overall her A1c showing good control as an outpatient, but during hospital stay she does require significant insulin dose ( no insulin requirement as an outpatient ), is currently on Lantus to 75 units daily, 6 with meals, and insulin sliding scale . 03/14/2019: Blood sugar has been uncontrolled.  Blood sugar was 54 last night.  Will discontinue Glucotrol XL.  Will change the dose of subcutaneous Lantus to 10 units twice daily.  Will consult diabetes educator.  Likely, patient will be discharged back home once blood sugar control is optimized. 03/15/2019: Kindly see above documentation.  Code Status : Full  Family Communication  :  No family at bedside.  Disposition Plan  : Home in 1 to 2 days if continues to be stable  Consults  :  PCCM  Procedures  : None  DVT Prophylaxis  :   Subcutaneous Heparin     Subjective: No shortness of breath No chest pain.  Objective: Vitals:   03/15/19 0348 03/15/19 0736 03/15/19 0800 03/15/19 1137  BP: (!) 127/58 (!) 145/56 (!) 147/55 (!) 163/67  Pulse: 80 67 67 76  Resp: 14 17 12 19   Temp: 97.6 F (36.4 C) (!) 97.5 F (36.4 C)  (!) 97.4 F (36.3 C)  TempSrc: Oral Oral  Oral  SpO2: 97% 94% 93% 93%  Weight: 96.3 kg     Height:        Intake/Output Summary (Last 24 hours) at 03/15/2019 1307 Last data filed at 03/15/2019 0740 Gross per 24 hour  Intake -  Output 1100 ml  Net -1100 ml   Filed Weights   03/12/19 0529 03/14/19 0352 03/15/19 0348  Weight: 96.2 kg 95.9 kg 96.3 kg    Examination:  General exam: Alert, awake, oriented x3 Respiratory system: Clear to auscultation.   Cardiovascular system: S1 & S2. Gastrointestinal system: Abdomen is morbidly obese, soft and nontender. Normal bowel sounds heard. Central nervous system: Alert and oriented. No focal neurological deficits. Extremities: Bilateral ankle edema.  The size of right lower extremity is chronically greater than left lower extremity.      Data Reviewed: I have personally reviewed following labs and imaging studies  CBC: Recent Labs  Lab 03/10/19 0352 03/11/19 0452 03/13/19 0438 03/14/19 0448  WBC 15.6* 15.7* 11.4* 11.0*  HGB 12.9 11.9* 12.1 11.8*  HCT 37.4 36.0 35.5* 36.0  MCV 85.4 88.2 86.8 88.2  PLT 468* 495* 407* 428*  Basic Metabolic Panel: Recent Labs  Lab 03/11/19 0452 03/12/19 0738 03/13/19 0438 03/14/19 0448 03/15/19 0722  NA 131* 135 135 134* 135  K 3.7 4.1 4.4 4.7 4.5  CL 97* 101 105 104 106  CO2 22 22 22  20* 19*  GLUCOSE 173* 83 65* 137* 75  BUN 107* 109* 90* 69* 52*  CREATININE 2.32* 2.00* 1.59* 1.30* 1.25*  CALCIUM 9.2 9.4 9.4 9.6 9.6  PHOS  --   --   --   --  3.9   GFR: Estimated Creatinine Clearance: 46 mL/min (A) (by C-G formula based on SCr of 1.25 mg/dL (H)). Liver Function Tests: Recent Labs  Lab 03/15/19 0722  ALBUMIN  3.0*   No results for input(s): LIPASE, AMYLASE in the last 168 hours. No results for input(s): AMMONIA in the last 168 hours. Coagulation Profile: No results for input(s): INR, PROTIME in the last 168 hours. Cardiac Enzymes: No results for input(s): CKTOTAL, CKMB, CKMBINDEX, TROPONINI in the last 168 hours. BNP (last 3 results) No results for input(s): PROBNP in the last 8760 hours. HbA1C: No results for input(s): HGBA1C in the last 72 hours. CBG: Recent Labs  Lab 03/14/19 1158 03/14/19 1645 03/14/19 2122 03/15/19 0732 03/15/19 1133  GLUCAP 299* 220* 117* 70 212*   Lipid Profile: No results for input(s): CHOL, HDL, LDLCALC, TRIG, CHOLHDL, LDLDIRECT in the last 72 hours. Thyroid Function Tests: No results for input(s): TSH, T4TOTAL, FREET4, T3FREE, THYROIDAB in the last 72 hours. Anemia Panel: No results for input(s): VITAMINB12, FOLATE, FERRITIN, TIBC, IRON, RETICCTPCT in the last 72 hours. Sepsis Labs: No results for input(s): PROCALCITON, LATICACIDVEN in the last 168 hours.  Recent Results (from the past 240 hour(s))  SARS Coronavirus 2 [facility] order, Performed in [facility] hospital lab) Nasopharyngeal Nasopharyngeal Swab     Status: None   Collection Time: 03/05/19  3:30 PM   Specimen: Nasopharyngeal Swab  Result Value Ref Range Status   SARS Coronavirus 2 NEGATIVE NEGATIVE Final    Comment: (NOTE) If result is NEGATIVE SARS-CoV-2 target nucleic acids are NOT DETECTED. The SARS-CoV-2 RNA is generally detectable in upper and lower  respiratory specimens during the acute phase of infection. The lowest  concentration of SARS-CoV-2 viral copies this assay can detect is 250  copies / mL. A negative result does not preclude SARS-CoV-2 infection  and should not be used as the sole basis for treatment or other  patient management decisions.  A negative result may occur with  improper specimen collection / handling, submission of specimen other  than nasopharyngeal swab,  presence of viral mutation(s) within the  areas targeted by this assay, and inadequate number of viral copies  (<250 copies / mL). A negative result must be combined with clinical  observations, patient history, and epidemiological information. If result is POSITIVE SARS-CoV-2 target nucleic acids are DETECTED. The SARS-CoV-2 RNA is generally detectable in upper and lower  respiratory specimens dur ing the acute phase of infection.  Positive  results are indicative of active infection with SARS-CoV-2.  Clinical  correlation with patient history and other diagnostic information is  necessary to determine patient infection status.  Positive results do  not rule out bacterial infection or co-infection with other viruses. If result is PRESUMPTIVE POSTIVE SARS-CoV-2 nucleic acids MAY BE PRESENT.   A presumptive positive result was obtained on the submitted specimen  and confirmed on repeat testing.  While 2019 novel coronavirus  (SARS-CoV-2) nucleic acids may be present in the submitted sample  additional confirmatory testing may be necessary for epidemiological  and / or clinical management purposes  to differentiate between  SARS-CoV-2 and other Sarbecovirus currently known to infect humans.  If clinically indicated additional testing with an alternate test  methodology [id number]) is advised. The SARS-CoV-2 RNA is generally  detectable in upper and lower respiratory sp ecimens during the acute  phase of infection. The expected result is Negative. Fact Sheet for Patients:  BoilerBrush.com.cyhttps://www.fda.gov/media/136312/download Fact Sheet for Healthcare Providers: https://pope.com/https://www.fda.gov/media/136313/download This test is not yet approved or cleared by the [place] FDA and has been authorized for detection and/or diagnosis of SARS-CoV-2 by FDA under an Emergency Use Authorization (EUA).  This EUA will remain in effect (meaning this test can be used) for the duration of the COVID-19 declaration under  Section 564(b)(1) of the Act, 21 U.S.C. section 360bbb-3(b)(1), unless the authorization is terminated or revoked sooner. Performed at [facility], [address] [place], [place] [address]   Respiratory Panel by PCR     Status: None   Collection Time: 03/05/19  6:25 PM   Specimen: Nasopharyngeal Swab; Respiratory  Result Value Ref Range Status   Adenovirus NOT DETECTED NOT DETECTED Final   Coronavirus 229E NOT DETECTED NOT DETECTED Final    Comment: (NOTE) The Coronavirus on the Respiratory Panel, DOES NOT test for the novel  Coronavirus (2019 nCoV)    Coronavirus HKU1 NOT DETECTED NOT DETECTED Final   Coronavirus NL63 NOT DETECTED NOT DETECTED Final   Coronavirus OC43 NOT DETECTED NOT DETECTED Final   Metapneumovirus NOT DETECTED NOT DETECTED Final   Rhinovirus / Enterovirus NOT DETECTED NOT DETECTED Final   Influenza A NOT DETECTED NOT DETECTED Final   Influenza B NOT DETECTED NOT DETECTED Final   Parainfluenza Virus 1 NOT DETECTED NOT DETECTED Final   Parainfluenza Virus 2 NOT DETECTED NOT DETECTED Final   Parainfluenza Virus 3 NOT DETECTED NOT DETECTED Final   Parainfluenza Virus 4 NOT DETECTED NOT DETECTED Final   Respiratory Syncytial Virus NOT DETECTED NOT DETECTED Final   Bordetella pertussis NOT DETECTED NOT DETECTED Final   Chlamydophila pneumoniae NOT DETECTED NOT DETECTED Final   Mycoplasma pneumoniae NOT DETECTED NOT DETECTED Final    Comment: Performed at [facility], [address] [place], [place] [address]         Radiology Studies: No results found.      Scheduled Meds: . amLODipine  10 mg Oral Daily  . atorvastatin  40 mg Oral q1800  . cholecalciferol  2,000 Units Oral Daily  . docusate sodium  100 mg Oral BID  . fluticasone  2 spray Each Nare Daily  . heparin injection (subcutaneous)  5,000 Units Subcutaneous Q8H  . insulin aspart  0-9 Units Subcutaneous TID WC  . insulin aspart  6 Units Subcutaneous TID WC  . insulin glargine   10 Units Subcutaneous BID  . loratadine  10 mg Oral Daily  . Melatonin  3 mg Oral QHS  . multivitamin  1 tablet Oral Daily  . omega-3 acid ethyl esters  1 g Oral Daily  . sertraline  50 mg Oral Daily  . umeclidinium-vilanterol  1 puff Inhalation Daily   Continuous Infusions:   LOS: 11 days      Barnetta ChapelSylvester I Anikah Hogge, MD [facility] Pager on amion  If 7PM-7AM, please contact night-coverage [email or web address] Password [email or web address] 03/15/2019, 1:07 PM

## 2019-03-16 LAB — GLUCOSE, CAPILLARY: Glucose-Capillary: 101 mg/dL — ABNORMAL HIGH (ref 70–99)

## 2019-03-16 MED ORDER — CARVEDILOL 3.125 MG PO TABS: 3.1250 mg | ORAL_TABLET | Freq: Two times a day (BID) | ORAL | 0 refills | Status: DC

## 2019-03-16 MED ORDER — FUROSEMIDE 20 MG PO TABS: 20.0000 mg | ORAL_TABLET | Freq: Every day | ORAL | 0 refills | Status: DC

## 2019-03-16 NOTE — Discharge Summary (Signed)
Physician Discharge Summary  Kayla Brown DGL:[id number] DOB: 05-27-1951 DOA: 03/03/2019  PCP: [name], MD  Admit date: 03/03/2019 Discharge date: 03/16/2019  Admitted From: Home Disposition:  Home  Discharge Condition:Stable CODE STATUS:FULL Diet recommendation: Heart Healthy  Brief/Interim Summary:  68 y.o.femalewith medical history significant ofhypertension, pulmonary hypertension, diabetes, morbid obesity, obstructive sleep apnea presenting to the hospital for evaluation of shortness of breath and hypertension, work-up significant for hypoxia, volume overload, she was admitted for IV diuresis. Patient developed  increased oxygen requirement on 9/10, at 8 L nasal cannula, PCCM consulted, work-up significant for atelectasis and pulmonary edema. Patient improved with IV diuresis, and pulmonary PT, but unfortunately developed acute kidney injury due to diuresis.  She was then started on gentle IV fluids.  Kidney function has improved now.  Patient evaluated by physical therapy and recommended home health on discharge. Patient is hemodynamically stable for discharge to home today.  Following problems were addressed during her hospitalization:  Acute hypoxic respiratory failure secondary to acute on chronic diastolic congestive heart failure: - Secondary to volume overload, and atelectasis.  Currently stable. -Pulmonary input greatly appreciated, this is most likely in the setting of acute pulmonary edema, and poor inspiratory effort/atelectasis. -VQ scan with no evidence of PE -She has history of COPD.  She follows with pulmonology as an outpatient.  Acute on chronic diastolic CHF -Repeat echo this admission present preserved EF, but impaired relaxation -Required IV diuresis.  Add Coreg 3.125 mg p.o. twice daily.  Discontinued chlorthalidone, hydrochlorothiazide, ramipril.  Started on low-dose Lasix, carvedilol. -Recommend to follow-up with cardiology as an  outpatient.  AKI on CKD 3 -Baseline creatinineis around 1, it did peak at 2.39/16, this was most likely due to hypotension and overdiuresis. -Improvied on gentle hydration.   -Check BMP in a week with follow-up with PCP.  Essential hypertension -Continue current medications  OSA -Continue CPAP at night  Diabetes mellitus -A1c is 8 , Outpatient Diabetes medications:Jardiance 10 mg daily, Glipizide XL 10 mg QAM, Metformin XR 1000 mg BID, Actos 30 mg -Overall her A1c showing good controlas an outpatient.  After discussion with diabetic coordinator, recommend to continue previous antidiabetics except for Actos  Discharge Diagnoses:  Principal Problem:   Acute respiratory failure with hypoxia ([place]) Active Problems:   Essential hypertension, benign   OSA (obstructive sleep apnea)   Pulmonary edema   AKI (acute kidney injury) [facility])    Discharge Instructions  Discharge Instructions    Diet - low sodium heart healthy   Complete by: As directed    Discharge instructions   Complete by: As directed    1)Please follow-up with your PCP in a week.  Do a BMP test during the follow-up. 2)Take prescribed medications as instructed. 3)Limit fluid intake to less than 2 L a day.  Monitor your weight. 4)Follow up with endocrinology as an outpatient.  Check hemoglobin A1c in 3 months. 5)Follow up with your pulmonologist and cardiologist.   Increase activity slowly   Complete by: As directed      Allergies as of 03/16/2019   No Known Allergies     Medication List    STOP taking these medications   amoxicillin-clavulanate 875-125 MG tablet Commonly known as: Augmentin   chlorthalidone 50 MG tablet Commonly known as: HYGROTON   hydrALAZINE 25 MG tablet Commonly known as: APRESOLINE   hydrochlorothiazide 12.5 MG capsule Commonly known as: MICROZIDE   pioglitazone 30 MG tablet Commonly known as: ACTOS   ramipril 10 MG capsule Commonly known  as: ALTACE   spironolactone 25  MG tablet Commonly known as: ALDACTONE     TAKE these medications   Alcohol Prep Pads Pt uses an alcohol pad each time sugars are tested. Pt tests twice daily. Dx E11.9   amLODipine 10 MG tablet Commonly known as: NORVASC TAKE 1 TABLET BY MOUTH EVERY DAY   Anoro Ellipta 62.5-25 MCG/INH Aepb Generic drug: umeclidinium-vilanterol Inhale 1 puff into the lungs daily.   atorvastatin 40 MG tablet Commonly known as: LIPITOR TAKE 1 TABLET BY MOUTH EVERY DAY What changed: when to take this   carvedilol 3.125 MG tablet Commonly known as: COREG Take 1 tablet (3.125 mg total) by mouth 2 (two) times daily with a meal.   fish oil-omega-3 fatty acids 1000 MG capsule Take 1 g by mouth daily.   fluticasone 50 MCG/ACT nasal spray Commonly known as: FLONASE SPRAY 2 SPRAYS INTO EACH NOSTRIL EVERY DAY What changed: See the new instructions.   furosemide 20 MG tablet Commonly known as: Lasix Take 1 tablet (20 mg total) by mouth daily.   glipiZIDE 10 MG 24 hr tablet Commonly known as: GLUCOTROL XL TAKE 1 TABLET BY MOUTH ONCE DAILY WITH BREAKFAST What changed: See the new instructions.   glucose blood test strip Commonly known as: FREESTYLE LITE USE TO TEST BLOOD GLUCOSE 2 TIMES DAILY. Dx E11.9   Jardiance 10 MG Tabs tablet Generic drug: empagliflozin Take 10 mg by mouth daily before breakfast.   loratadine 10 MG tablet Commonly known as: CLARITIN TAKE 1 TABLET BY MOUTH EVERY DAY   meclizine 25 MG tablet Commonly known as: ANTIVERT Take 1 tablet (25 mg total) by mouth 3 (three) times daily as needed for dizziness.   Melatonin 3 MG Tabs Take 3 mg by mouth at bedtime.   metFORMIN 500 MG 24 hr tablet Commonly known as: GLUCOPHAGE-XR TAKE 2 TABLETS BY MOUTH TWICE DAILY BEFORE A MEAL What changed: See the new instructions.   OVER THE COUNTER MEDICATION Take 1 tablet by mouth daily. Equate Vision Formula 50+   sertraline 50 MG tablet Commonly known as: ZOLOFT TAKE 1 TABLET BY  MOUTH EVERY DAY   Suvorexant 10 MG Tabs Commonly known as: Belsomra Take 10 mg by mouth at bedtime.   TRUEplus Lancets 30G Misc Use one lancet each time sugars are tested. Pt tests twice daily. Dx. E11.9   Vision Formula Tabs Take 1 tablet by mouth daily.   VITAMIN D PO Take 2,000 Units by mouth daily.      Follow-up Information    [facility].   Why:  [phone]       Sheliah Hatchabori, Katherine E, MD. Schedule an appointment as soon as possible for a visit in 1 week(s).   Specialty: Family Medicine Contact information: [address] [place] [id number] [phone]          No Known Allergies  Consultations:  PCCM   Procedures/Studies: Dg Chest 2 View  Result Date: 03/03/2019 CLINICAL DATA:  Pneumonia.  Short of breath. EXAM: CHEST - 2 VIEW COMPARISON:  02/17/2019 FINDINGS: Mild cardiac enlargement. Pulmonary vascular congestion identified. Lungs are hyperinflated and there are coarsened interstitial markings noted bilaterally. Pleural thickening along the major fissure is again noted. Scar or atelectasis noted in the right middle lobe. IMPRESSION: Cardiac enlargement and mild pulmonary vascular congestion with thickening along the major fissure of the right lung. Findings may reflect early CHF. No airspace consolidation identified. Electronically Signed   By: Ladona Ridgelaylor  Bradly ChrisStroud M.D.   On: 03/03/2019 17:55   Dg Chest 2 View  Result Date: 02/18/2019 CLINICAL DATA:  Dyspnea on exertion.  Pulmonary hypertension. EXAM: CHEST - 2 VIEW COMPARISON:  None. FINDINGS: Normal heart size. Normal mediastinal contour. No pneumothorax. No pleural effusion. Patchy right middle lobe opacity. No pulmonary edema. IMPRESSION: Patchy right middle lobe opacity suggests a pneumonia. Recommend follow-up PA and lateral post treatment chest radiographs in 4-6 weeks. Electronically Signed   By: Delbert PhenixJason A Poff M.D.   On: 02/18/2019 08:47   Ct Chest Wo Contrast  Result Date:  03/05/2019 CLINICAL DATA:  Acute hypoxemic respiratory failure EXAM: CT CHEST WITHOUT CONTRAST TECHNIQUE: Multidetector CT imaging of the chest was performed following the standard protocol without IV contrast. COMPARISON:  CT 04/05/2015 FINDINGS: Cardiovascular: Normal heart size. No pericardial effusion. Atherosclerotic calcification of the aortic leaflets is noted. There is mitral annular calcification is well. The aorta is normal caliber with atheromatous plaque. Additional calcifications are present in the proximal great vessels and upper abdominal aorta. Major venous structures are unremarkable. Mediastinum/Nodes: Numerous though nonenlarged mediastinal nodes are present. Evaluation for hilar adenopathy is limited in the absence of contrast. No axillary adenopathy. Thyroid gland and thoracic inlet are unremarkable. Bowing of the posterior trachea 0 likely related to imaging during exhalation. There is mild airways thickening. The esophagus is unremarkable. Lungs/Pleura: Redemonstration of mosaic attenuation throughout the lungs superimposed on centrilobular and paraseptal emphysema. There is increasing bandlike areas of subsegmental atelectasis and/or scarring as well as additional atelectatic change posteriorly in the lung bases. No pneumothorax. No effusion. Stable subpleural area of scarring is seen in the right middle lobe. Upper Abdomen: Small accessory splenule. Mild bilateral symmetric perinephric stranding, a nonspecific finding though may correlate with either age or decreased renal function. Portion of the hepatic flexure is interposed anterior to the liver. Fatty replacement of the pancreas. No pancreatic ductal dilatation or surrounding inflammatory changes. No acute abnormalities present in the visualized portions of the upper abdomen. Musculoskeletal: Multilevel degenerative changes are present in the imaged portions of the spine. No acute osseous abnormality or suspicious osseous lesion.  IMPRESSION: 1. Extensive atelectatic changes are present throughout the lungs with a dependent predominance. 2. Redemonstration of mosaic attenuation throughout the lungs superimposed on centrilobular and paraseptal emphysema, suggestive of small airways disease. 3. Mild bilateral symmetric perinephric stranding, a nonspecific finding though may correlate with either age or decreased renal function. 4. Aortic Atherosclerosis (ICD10-I70.0) and Emphysema (ICD10-J43.9). Electronically Signed   By: Kreg ShropshirePrice  DeHay M.D.   On: 03/05/2019 22:41   Nm Pulmonary Perf And Vent  Result Date: 03/04/2019 CLINICAL DATA:  Evaluate for pulmonary embolism. Short of breath with hypertension. EXAM: NUCLEAR MEDICINE VENTILATION - PERFUSION LUNG SCAN TECHNIQUE: Ventilation images were obtained in multiple projections using inhaled aerosol Tc-4434m DTPA. Perfusion images were obtained in multiple projections after intravenous injection of Tc-7434m MAA. RADIOPHARMACEUTICALS:  32.7 mCi of Tc-234m DTPA aerosol inhalation and 1.53 mCi Tc2134m MAA IV COMPARISON:  Chest radiograph 03/03/2019 FINDINGS: Ventilation: Large segmental ventilation defect identified involving the superior segment of left lower lobe. Medium to large segmental perfusion defect identified within the lateral left lower lobe. Perfusion: Corresponding perfusion abnormalities are noted within the superior segment of left lower lobe and lateral left lower lobe. IMPRESSION: No unmatched segmental perfusion defects identified to suggest acute pulmonary embolus. Electronically Signed   By: Signa Kellaylor  Stroud M.D.   On: 03/04/2019 15:54   Dg Chest Port 1 View  Result Date: 03/11/2019 CLINICAL DATA:  68 year old female with a proxy. EXAM: PORTABLE CHEST 1 VIEW COMPARISON:  Chest radiograph dated 03/05/2019 FINDINGS: Chronic interstitial coarsening and mild bronchitic changes. No focal consolidation, pleural effusion, or pneumothorax. Linear density in the left mid lung field, likely  atelectatic changes. The cardiac silhouette is within normal limits. No acute osseous pathology. IMPRESSION: No active disease. Electronically Signed   By: Elgie Collard M.D.   On: 03/11/2019 10:21   Dg Chest Port 1 View  Result Date: 03/05/2019 CLINICAL DATA:  Hypoxia EXAM: PORTABLE CHEST 1 VIEW COMPARISON:  03/03/2019 FINDINGS: Cardiomegaly. Mild irregular scarring or atelectasis of the mid lungs bilaterally. No acute appearing airspace opacity. IMPRESSION: Cardiomegaly. Mild irregular scarring or atelectasis of the mid lungs bilaterally. No acute appearing airspace opacity. Electronically Signed   By: Lauralyn Primes M.D.   On: 03/05/2019 15:07       Subjective:  Patient seen and examined at bedside this morning.  Hemodynamically stable for discharge.  Patient verbalized understanding of further plan  Discharge Exam: Vitals:   03/16/19 0600 03/16/19 0739  BP: (!) 143/55 (!) 134/54  Pulse: 76 64  Resp:    Temp:  97.8 F (36.6 C)  SpO2: (!) 86% 99%   Vitals:   03/16/19 0400 03/16/19 0500 03/16/19 0600 03/16/19 0739  BP: (!) 137/56  (!) 143/55 (!) 134/54  Pulse: 73 73 76 64  Resp:      Temp:    97.8 F (36.6 C)  TempSrc:    Oral  SpO2: 96% 95% (!) 86% 99%  Weight:      Height:        General: Pt is alert, awake, not in acute distress,obese Cardiovascular: RRR, S1/S2 +, no rubs, no gallops Respiratory: CTA bilaterally, no wheezing, no rhonchi Abdominal: Soft, NT, ND, bowel sounds + Extremities: no edema, no cyanosis    The results of significant diagnostics from this hospitalization (including imaging, microbiology, ancillary and laboratory) are listed below for reference.     Microbiology: No results found for this or any previous visit (from the past 240 hour(s)).   Labs: BNP (last 3 results) Recent Labs    03/03/19 2357  BNP 50.8   Basic Metabolic Panel: Recent Labs  Lab 03/11/19 0452 03/12/19 0738 03/13/19 0438 03/14/19 0448 03/15/19 0722  NA 131*  135 135 134* 135  K 3.7 4.1 4.4 4.7 4.5  CL 97* 101 105 104 106  CO2 22 22 22  20* 19*  GLUCOSE 173* 83 65* 137* 75  BUN 107* 109* 90* 69* 52*  CREATININE 2.32* 2.00* 1.59* 1.30* 1.25*  CALCIUM 9.2 9.4 9.4 9.6 9.6  PHOS  --   --   --   --  3.9   Liver Function Tests: Recent Labs  Lab 03/15/19 0722  ALBUMIN 3.0*   No results for input(s): LIPASE, AMYLASE in the last 168 hours. No results for input(s): AMMONIA in the last 168 hours. CBC: Recent Labs  Lab 03/10/19 0352 03/11/19 0452 03/13/19 0438 03/14/19 0448  WBC 15.6* 15.7* 11.4* 11.0*  HGB 12.9 11.9* 12.1 11.8*  HCT 37.4 36.0 35.5* 36.0  MCV 85.4 88.2 86.8 88.2  PLT 468* 495* 407* 428*   Cardiac Enzymes: No results for input(s): CKTOTAL, CKMB, CKMBINDEX, TROPONINI in the last 168 hours. BNP: Invalid input(s): POCBNP CBG: Recent Labs  Lab 03/15/19 1133 03/15/19 1354 03/15/19 1612 03/15/19 2124 03/16/19 0741  GLUCAP 212* 167* 373* 180* 101*   D-Dimer No results for input(s): DDIMER in the last 72 hours. Hgb  A1c No results for input(s): HGBA1C in the last 72 hours. Lipid Profile No results for input(s): CHOL, HDL, LDLCALC, TRIG, CHOLHDL, LDLDIRECT in the last 72 hours. Thyroid function studies No results for input(s): TSH, T4TOTAL, T3FREE, THYROIDAB in the last 72 hours.  Invalid input(s): FREET3 Anemia work up No results for input(s): VITAMINB12, FOLATE, FERRITIN, TIBC, IRON, RETICCTPCT in the last 72 hours. Urinalysis    Component Value Date/Time   COLORURINE YELLOW 03/04/2019 0058   APPEARANCEUR CLEAR 03/04/2019 0058   LABSPEC 1.014 03/04/2019 0058   PHURINE 5.0 03/04/2019 0058   GLUCOSEU >=500 (A) 03/04/2019 0058   HGBUR NEGATIVE 03/04/2019 0058   BILIRUBINUR NEGATIVE 03/04/2019 0058   KETONESUR NEGATIVE 03/04/2019 0058   PROTEINUR NEGATIVE 03/04/2019 0058   NITRITE NEGATIVE 03/04/2019 0058   LEUKOCYTESUR SMALL (A) 03/04/2019 0058   Sepsis Labs Invalid input(s): PROCALCITONIN,  WBC,   LACTICIDVEN Microbiology No results found for this or any previous visit (from the past 240 hour(s)).  Please note: You were cared for by a hospitalist during your hospital stay. Once you are discharged, your primary care physician will handle any further medical issues. Please note that NO REFILLS for any discharge medications will be authorized once you are discharged, as it is imperative that you return to your primary care physician (or establish a relationship with a primary care physician if you do not have one) for your post hospital discharge needs so that they can reassess your need for medications and monitor your lab values.    Time coordinating discharge: 40 minutes  SIGNED:   Burnadette Pop, MD  [facility] 03/16/2019, 9:13 AM Pager [phone]  If 7PM-7AM, please contact night-coverage [email or web address] Password [email or web address]

## 2019-03-16 NOTE — Progress Notes (Signed)
Occupational Therapy Treatment Patient Details Name: Kayla Brown MRN: [id number] DOB: 06/15/1951 Today's Date: 03/16/2019    History of present illness 68 y.o. female with medical history significant of hypertension, pulmonary hypertension, diabetes, morbid obesity, obstructive sleep apnea presenting 03/03/19 to the hospital for evaluation of shortness of breath and hypertension.  Patient states her blood pressure has been high for very long time with SBP 220 in ED. Chest x-ray showing cardiac enlargement and mild pulmonary vascular congestion. Findings thought to reflect early CHF. V/Q negative. COVID-19 negative. CBGs as high as 600.    OT comments  Pt progressing towards OT goals. She tolerated room level mobility using RW, completing toileting and standing grooming ADL overall at minguard assist level. Pt on RA during session with SpO2 >92%. Further reviewed/educated pt re: energy conservation techniques during functional tasks after return home with pt verbalizing understanding. Pt anticipating d/c home today.   Follow Up Recommendations  Home health OT;Supervision/Assistance - 24 hour    Equipment Recommendations  None recommended by OT          Precautions / Restrictions Precautions Precautions: Fall Precaution Comments: monitor O2 and pulses Restrictions Weight Bearing Restrictions: No       Mobility Bed Mobility               General bed mobility comments: received OOB in recliner  Transfers Overall transfer level: Needs assistance Equipment used: Rolling walker (2 wheeled) Transfers: Sit to/from Stand Sit to Stand: Supervision              Balance Overall balance assessment: Needs assistance Sitting-balance support: Feet supported Sitting balance-Leahy Scale: Good     Standing balance support: Bilateral upper extremity supported;During functional activity Standing balance-Leahy Scale: Fair                             ADL either  performed or assessed with clinical judgement   ADL Overall ADL's : Needs assistance/impaired     Grooming: Min guard;Standing;Wash/dry Lawyer: Min guard;Ambulation;RW   Toileting- Clothing Manipulation and Hygiene: Min guard;Sitting/lateral lean;Sit to/from stand Toileting - Architect Details (indicate cue type and reason): performing clothing management including gown and mesh underwear     Functional mobility during ADLs: Min guard;Rolling walker General ADL Comments: improvements noted with mobility and endurance today     Vision       Perception     Praxis      Cognition Arousal/Alertness: Awake/alert Behavior During Therapy: WFL for tasks assessed/performed Overall Cognitive Status: Within Functional Limits for tasks assessed                                          Exercises     Shoulder Instructions       General Comments pt on RA with SpO2 >92% today    Pertinent Vitals/ Pain       Pain Assessment: No/denies pain  Home Living                                          Prior Functioning/Environment  Frequency  Min 2X/week        Progress Toward Goals  OT Goals(current goals can now be found in the care plan section)  Progress towards OT goals: Progressing toward goals  Acute Rehab OT Goals Patient Stated Goal: home and feel better OT Goal Formulation: With patient Time For Goal Achievement: 03/21/19 Potential to Achieve Goals: Good  Plan Discharge plan remains appropriate    Co-evaluation                 AM-PAC OT "6 Clicks" Daily Activity     Outcome Measure   Help from another person eating meals?: None Help from another person taking care of personal grooming?: A Little Help from another person toileting, which includes using toliet, bedpan, or urinal?: A Little Help from another person bathing (including washing, rinsing,  drying)?: A Little Help from another person to put on and taking off regular upper body clothing?: None Help from another person to put on and taking off regular lower body clothing?: A Little 6 Click Score: 20    End of Session Equipment Utilized During Treatment: Gait belt;Rolling walker  OT Visit Diagnosis: Unsteadiness on feet (R26.81);Muscle weakness (generalized) (M62.81);Other (comment)   Activity Tolerance Patient tolerated treatment well   Patient Left in chair;with call bell/phone within reach   Nurse Communication Mobility status        Time: 1040-1058 OT Time Calculation (min): 18 min  Charges: OT General Charges $OT Visit: 1 Visit OT Treatments $Self Care/Home Management : 8-22 mins  Kayla Brown, OT Supplemental Rehabilitation Services Pager [phone] Office [phone]    Kayla Brown 03/16/2019, 3:05 PM

## 2019-03-16 NOTE — Consult Note (Signed)
   [facility] CM Inpatient Consult   03/16/2019  Kayla Brown 1950-07-14 [id number]   Patient screened for medium risk score for unplanned readmission score  And long length of stay hospitalizations to check if potential [facility] Management services needed.  Review of patient's medical record reveals per MD notes 03/05/2019 patient is a 68 year old female with PMH of morbid obesity, OSA and pulmonary HTN presenting to PCCM with acute hypoxemic respiratory failure.  Patient initially had pulmonary edema and was diuresed but inspite of that continued to have increase in O2 demand and PCCM was called on consultation.   Spoke with the patient via hospital phone, HIPAA verified.  Explained [place] Management for post hospital follow up available.  Patient acknowledges her home health care.  She does consent to [facility] telephone nurse follow up needs for disease management. She also endorses Morenike Cuff as her contact person and husband.  Patient has an old [facility] consent on file 2016.  Primary Care Provider is Annye Asa, MD at [facility], this practice is listed to provide the Transition of Care follow up.  She states she already has made her appointment.  Pharmacy is:  [facility].  Patient states medications are changing.  Transportation to provider: husband  Reviewed inpatient [facility] team notes and patient for [facility] noted with [facility]   Plan: Will follow with [facility] Coordinator for complex disease management. Explained that this will be telephonic support and home health will provide the home visits.  For questions contact:   Natividad Brood, RN BSN [facility] Liaison  [phone] business mobile phone Toll free office [phone]  Fax number: [phone] [email or web address]

## 2019-03-17 ENCOUNTER — Other Ambulatory Visit: Payer: Self-pay

## 2019-03-17 NOTE — Patient Outreach (Addendum)
[facility]) Care Management  03/17/2019  Kayla Brown 10/26/1950 [id number]     Transition of Care Referral  Referral Date: 03/16/2019 Referral Source: [place] Date of Admission: 03/03/2019 Diagnosis: "SOB, acute respiratory failure" Date of Discharge: 03/16/2019 Facility: [place]    Outreach attempt # 1 to patient. Spoke with patient and spouse. Patient shares that she is doing "pretty good" since returning home yesterday. She denies any acute issues or concerns. She voices no pain. She reports that she had a little SOB yesterday evening when she was up moving around but otherwise she has been fine. She has supportive spouse in the home to assist her as needed. No recent falls. Patient has walker in the home to assist with ambulation. [facility] services called yesterday evening to check on patient and will be calling her back with home visit appt time.  Conditions: Per chart review, patient has PMH of HTN, OSA, pulmonary HTN, obesity and DM. She reports that she has scale in the home and is weighing daily. Weight this morning 204.8 lbs. She denies any edema at present. RN CM discussed with patient proper weighing technique, weight recording and abnormal weight gain and when to seek medical attention. RN CM instructed patient on importance of adhering to low salt diet.  Medications: Med review completed with both patient and spouse. Patient reports financial hardship affroding Anoro and Jardiance. She states that she has some samples of Anoro. MD started patient on Jardiance prior to hospitalization. Patient states she never took med because when she went to get me filled she was told it would be about $400/month. Patient agreeable to [facility] pharmacy referral for possible assistance.   Appointments: Patient voices she has pulmonologist appt tomorrow and PCP appt on 03/20/2019.  Consent: [facility] services reviewed and discussed with patient/spouse.  Verbal consent for services given. PCP does TOC.    Plan: RN CM will make outreach attempt to patient with two weeks. RN CM will send successful outreach letter to patient. RN CM will send [facility] pharmacy referral for possible med assistance.   Enzo Montgomery, RN,BSN,CCM [facility] Telephonic Care Management Coordinator Direct Phone: [phone] Toll Free: [phone] Fax: [phone]

## 2019-03-18 ENCOUNTER — Telehealth: Payer: Self-pay | Admitting: Pharmacist

## 2019-03-18 ENCOUNTER — Telehealth: Payer: Self-pay

## 2019-03-18 ENCOUNTER — Ambulatory Visit (INDEPENDENT_AMBULATORY_CARE_PROVIDER_SITE_OTHER): Payer: Medicare Other | Admitting: Adult Health

## 2019-03-18 ENCOUNTER — Other Ambulatory Visit: Payer: Self-pay

## 2019-03-18 ENCOUNTER — Telehealth: Payer: Self-pay | Admitting: Family Medicine

## 2019-03-18 ENCOUNTER — Encounter: Payer: Self-pay | Admitting: Adult Health

## 2019-03-18 DIAGNOSIS — R0609 Other forms of dyspnea: Secondary | ICD-10-CM | POA: Diagnosis not present

## 2019-03-18 DIAGNOSIS — I272 Pulmonary hypertension, unspecified: Secondary | ICD-10-CM

## 2019-03-18 DIAGNOSIS — G4733 Obstructive sleep apnea (adult) (pediatric): Secondary | ICD-10-CM | POA: Diagnosis not present

## 2019-03-18 DIAGNOSIS — I5030 Unspecified diastolic (congestive) heart failure: Secondary | ICD-10-CM | POA: Insufficient documentation

## 2019-03-18 DIAGNOSIS — I5033 Acute on chronic diastolic (congestive) heart failure: Secondary | ICD-10-CM | POA: Diagnosis not present

## 2019-03-18 NOTE — Assessment & Plan Note (Signed)
Suspect is multifactorial.  With underlying diastolic heart failure, morbid obesity and deconditioning. Patient was a preemie.  Has secondhand smoke.  Will check PFTs on return.  Check 6-minute walk on return. For now can discontinue Anoro.  Plan  Patient Instructions  Weigh daily , keep log  Low salt diet .  Continue on Lasix 20mg  daily  Follow up with Primary MD this week as planned.  Follow up with Dr. Radford Pax as planned.  Continue on CPAP At bedtime   Work on healthy weight loss .  Please avoid NSAIDS -ibuprofen , aleve, advil , etc.  Follow up with Dr. Annamaria Boots or Myrlene Riera NP in 4 weeks with PFT and 6 min walk  Please contact office for sooner follow up if symptoms do not improve or worsen or seek emergency care

## 2019-03-18 NOTE — Telephone Encounter (Signed)
Mary from [place] called in asking for a verbal ok, They can't see the pt until tomorrow and that is out of their 48 hr window. Please call with the ok to [phone]

## 2019-03-18 NOTE — Telephone Encounter (Signed)
Called and verbal ok given.  

## 2019-03-18 NOTE — Telephone Encounter (Signed)
Ok to proceed. 

## 2019-03-18 NOTE — Telephone Encounter (Signed)
Transition Care Management Follow-up Telephone Call   Date discharged? 9.21.20   How have you been since you were released from the hospital? Patient states that she is getting more strength back each day and "for the most part, feeling a lot better."   Do you understand why you were in the hospital? Yes, 9/8-9/21, congestive heart failure, HTN, diabetes, excess fluid around her lungs   Do you understand the discharge instructions? Yes, scheduled hospital follow up with PCP 9.25.20, had appt today with Tammy Parrett with [facility], and is eager to establish care with endocrinologist.   Where were you discharged to? Patient was discharged to her home. Husband, [name], was able to take this week off of work.   Items Reviewed:  Medications reviewed: yes  Allergies reviewed: N/A  Dietary changes reviewed: yes, low sodium  Referrals reviewed: yes   Functional Questionnaire:   Activities of Daily Living (ADLs):   She states they are independent in the following: Mr. Mancias is assisting  States they require assistance with the following: ambulation, bathing and hygiene, toileting and dressing   Any transportation issues/concerns?: no   Any patient concerns? Yes, patient is wanting to establish care with an endocrinologist and switch to a new cardiologist. Stated she would discuss in more detail with Dr. Birdie Riddle at her appt on Friday.    Confirmed importance and date/time of follow-up visits scheduled yes  Provider Appointment booked with Dr.[name]  Confirmed with patient if condition begins to worsen call PCP or go to the ER.  Patient was given the office number and encouraged to call back with question or concerns.  : yes

## 2019-03-18 NOTE — Assessment & Plan Note (Signed)
Recent hospitalization for decompensated diastolic heart failure. Patient improved clinically with aggressive diuresis. Advised patient to follow-up with cardiology in the next several weeks.  Continue on a low-salt diet.  Continue Lasix daily. Continue to keep daily log of weights.  Plan  Patient Instructions  Weigh daily , keep log  Low salt diet .  Continue on Lasix 20mg  daily  Follow up with Primary MD this week as planned.  Follow up with Dr. Radford Pax as planned.  Continue on CPAP At bedtime   Work on healthy weight loss .  Please avoid NSAIDS -ibuprofen , aleve, advil , etc.  Follow up with Dr. Annamaria Boots or Parrett NP in 4 weeks with PFT and 6 min walk  Please contact office for sooner follow up if symptoms do not improve or worsen or seek emergency care

## 2019-03-18 NOTE — Assessment & Plan Note (Signed)
Appears compliant with CPAP.  Will check a CPAP download to make sure control is optimal. Continue follow-up with cardiology

## 2019-03-18 NOTE — Patient Outreach (Addendum)
[facility]) Care Management  03/18/2019  Kayla Brown [birth year] [id number]   Patient was called regarding medication assistance with Anoro and Jardiance. Unfortunately, she did not answer the phone. HIPAA compliant message was left on her voicemail.  Plan: Send unsuccessful contact letter. Call patient back in 7-10 business days due to upcoming PAL.  Elayne Guerin, PharmD, [place] Clinical Pharmacist [phone]  ADDENDUM   Patient called me back. HIPAA identifiers were obtained. Patient said she was just calling me back because she got my message. She said she was just leaving the doctor's office and was holding off on Anoro until she has some testing done. In addition, she has an appointment with her PCP on Friday and would like to wait until after that appointment to talk just to make sure she is not started on a new medication.  Patient said she would call me back on Friday.  Plan: Await call from patient. Call patient back on Friday afternoon.  Elayne Guerin, PharmD, [place] Clinical Pharmacist [phone]

## 2019-03-18 NOTE — Telephone Encounter (Signed)
Ok for verbal OK?

## 2019-03-18 NOTE — Patient Instructions (Addendum)
Weigh daily , keep log  Low salt diet .  Continue on Lasix 20mg  daily  Follow up with Primary MD this week as planned.  Follow up with Dr. Radford Pax as planned.  Continue on CPAP At bedtime   Work on healthy weight loss .  Please avoid NSAIDS -ibuprofen , aleve, advil , etc.  Follow up with Dr. Annamaria Boots or Alecsander Hattabaugh NP in 4 weeks with PFT and 6 min walk  Please contact office for sooner follow up if symptoms do not improve or worsen or seek emergency care

## 2019-03-18 NOTE — Progress Notes (Signed)
@Patient  ID: Kayla Brown, female    DOB: 1951/05/27, 68 y.o.   MRN: [id number]  Chief Complaint  Patient presents with   Follow-up    Dyspnea     Referring provider: Sheliah Hatch, MD  HPI: 68 year old female never smoker (Second hand smoke exposure/wood stove exposure) seen for pulmonary consult February 16, 2019 for  progressive dyspnea Medical history significant for severe sleep apnea on nocturnal CPAP (managed by cardiology), pulmonary hypertension, hypertension, diabetes  TEST/EVENTS :  NPSG 05/14/16 AHI 93/ hr, desaturation to 84%  PFT 04/04/15   - mild Diffusion defect, normal flows and volumes without response to bronchodilator  2D echo February 02, 2019 EF 60-65%, normal systolic function right ventricle, mild to moderately dilated left atrium  2D echo March 06, 2019 EF greater than 65%, moderate LVH, right ventricle normal systolic function, right ventricle systolic pressure could not be assessed.  CT chest March 05, 2019 extensive atelectatic changes throughout the lungs with a dependent predominance, mosaic attenuation throughout the lungs, emphysema  VQ scan 03/04/2019 no unmatched segmental perfusion defects to suggest acute PE  03/18/2019  Patient was seen last month for a pulmonary consult for progressive dyspnea with exertion.  She was referred by cardiology for ongoing dyspnea.  She was set up for pulmonary function test.  Started on a sample of Anoro.  Felt to have a component of deconditioning. Patient was recently admitted earlier this month discharged on March 16, 2019 for decompensated diastolic heart failure.  She improved with aggressive diuresis Weight is down 14 pounds since last month.  ANA, ANCA,, viral panel negative.  Leg swelling is better. Has chronic leg swelling , with varicose veins . Changed to lasix 20mg  daily at discharge. Has chronic kidney disease.  Did not see a change in breathing with ANORO . No significant cough .  Feels  since discharge she is better.  Shortness of breath is decreased.  Patient is on CPAP at bedtime. Wears all night for 10-12 hr each night .   Prior to admission was able to do light housework and was independent.  Was born prematurely , at 6 months (2 lbs) . No childhood asthma or breathing limitations as teenager/young adult.  Worked in [employer] . No basement, hot tub. No birds/chicken. No travel . No unusual hobbies.  Patient adopted.  Has some intermittent nasal congestion.   No Known Allergies  Immunization History  Administered Date(s) Administered   Fluad Quad(high Dose 65+) 02/16/2019   Influenza,inj,Quad PF,6+ Mos 04/24/2016, 03/04/2017, 03/20/2018   Pneumococcal Conjugate-13 07/06/2016   Pneumococcal Polysaccharide-23 07/01/2017   Tdap 09/23/2009    Past Medical History:  Diagnosis Date   Anxiety    Diabetes mellitus    Edema of extremities 03/02/2016   Essential hypertension, benign    Lazy eye    Major depressive disorder, single episode, unspecified    Obesity (BMI 30-39.9) 03/01/2016   OSA (obstructive sleep apnea)    Pulmonary HTN (HCC)    mild with PASP by echo 05/2016   Pure hypercholesterolemia    Retinopathy, due to hypertension     Tobacco History: Social History   Tobacco Use  Smoking Status Never Smoker  Smokeless Tobacco Never Used   Counseling given: Not Answered   Outpatient Medications Prior to Visit  Medication Sig Dispense Refill   Alcohol Swabs (ALCOHOL PREP) PADS Pt uses an alcohol pad each time sugars are tested. Pt tests twice daily. Dx E11.9 100 each 3   amLODipine (NORVASC)  10 MG tablet TAKE 1 TABLET BY MOUTH EVERY DAY (Patient taking differently: Take 10 mg by mouth daily. ) 90 tablet 1   atorvastatin (LIPITOR) 40 MG tablet TAKE 1 TABLET BY MOUTH EVERY DAY (Patient taking differently: Take 40 mg by mouth daily at 6 PM. ) 90 tablet 1   carvedilol (COREG) 3.125 MG tablet Take 1 tablet (3.125 mg total) by mouth 2  (two) times daily with a meal. 60 tablet 0   Cholecalciferol (VITAMIN D PO) Take 2,000 Units by mouth daily.      fish oil-omega-3 fatty acids 1000 MG capsule Take 1 g by mouth daily.     fluticasone (FLONASE) 50 MCG/ACT nasal spray SPRAY 2 SPRAYS INTO EACH NOSTRIL EVERY DAY (Patient taking differently: Place 2 sprays into both nostrils daily. ) 18 g 1   furosemide (LASIX) 20 MG tablet Take 1 tablet (20 mg total) by mouth daily. 30 tablet 0   glipiZIDE (GLUCOTROL XL) 10 MG 24 hr tablet TAKE 1 TABLET BY MOUTH ONCE DAILY WITH BREAKFAST (Patient taking differently: Take 10 mg by mouth daily with breakfast. ) 90 tablet 0   glucose blood (FREESTYLE LITE) test strip USE TO TEST BLOOD GLUCOSE 2 TIMES DAILY. Dx E11.9 100 each 12   loratadine (CLARITIN) 10 MG tablet TAKE 1 TABLET BY MOUTH EVERY DAY (Patient taking differently: Take 10 mg by mouth daily. ) 30 tablet 11   Melatonin 3 MG TABS Take 3 mg by mouth at bedtime.     metFORMIN (GLUCOPHAGE-XR) 500 MG 24 hr tablet TAKE 2 TABLETS BY MOUTH TWICE DAILY BEFORE A MEAL (Patient taking differently: Take 1,000 mg by mouth 2 (two) times daily. ) 360 tablet 0   Multiple Vitamins-Minerals (VISION FORMULA) TABS Take 1 tablet by mouth daily.      OVER THE COUNTER MEDICATION Take 1 tablet by mouth daily. Equate Vision Formula 50+      sertraline (ZOLOFT) 50 MG tablet TAKE 1 TABLET BY MOUTH EVERY DAY (Patient taking differently: Take 50 mg by mouth daily. ) 90 tablet 2   TRUEPLUS LANCETS 30G MISC Use one lancet each time sugars are tested. Pt tests twice daily. Dx. E11.9 100 each 3   umeclidinium-vilanterol (ANORO ELLIPTA) 62.5-25 MCG/INH AEPB Inhale 1 puff into the lungs daily. 1 each 0   empagliflozin (JARDIANCE) 10 MG TABS tablet Take 10 mg by mouth daily before breakfast. (Patient not taking: Reported on 03/18/2019) 30 tablet 3   Suvorexant (BELSOMRA) 10 MG TABS Take 10 mg by mouth at bedtime. (Patient not taking: Reported on 03/18/2019) 30 tablet 1     meclizine (ANTIVERT) 25 MG tablet Take 1 tablet (25 mg total) by mouth 3 (three) times daily as needed for dizziness. (Patient not taking: Reported on 03/18/2019) 30 tablet 0   No facility-administered medications prior to visit.      Review of Systems:   Constitutional:   No  weight loss, night sweats,  Fevers, chills, + fatigue, or  lassitude.  HEENT:   No headaches,  Difficulty swallowing,  Tooth/dental problems, or  Sore throat,                No sneezing, itching, ear ache, nasal congestion, post nasal drip,   CV:  No chest pain,  Orthopnea, PND,+  swelling in lower extremities,  No anasarca, dizziness, palpitations, syncope.   GI  No heartburn, indigestion, abdominal pain, nausea, vomiting, diarrhea, change in bowel habits, loss of appetite, bloody stools.   Resp:  No chest wall deformity  Skin: no rash or lesions.  GU: no dysuria, change in color of urine, no urgency or frequency.  No flank pain, no hematuria   MS:  No joint pain or swelling.  No decreased range of motion.  No back pain.    Physical Exam  BP 124/62 (BP Location: Left Arm, Cuff Size: Large)    Pulse 78    Temp (!) 97 F (36.1 C) (Temporal)    Ht 5\' 1"  (1.549 m)    Wt 208 lb (94.3 kg)    LMP 06/25/2008    SpO2 95%    BMI 39.30 kg/m   GEN: A/Ox3; pleasant , NAD, obese , elderly    HEENT:  [place]/AT,    NOSE-clear, THROAT-clear, no lesions, no postnasal drip or exudate noted. Class 2-3 MP airway   NECK:  Supple w/ fair ROM; no JVD; normal carotid impulses w/o bruits; no thyromegaly or nodules palpated; no lymphadenopathy.    RESP  Clear  P & A; w/o, wheezes/ rales/ or rhonchi. no accessory muscle use, no dullness to percussion  CARD:  RRR, no m/r/g,1+  peripheral edema, pulses intact, no cyanosis or clubbing.  GI:   Soft & nt; nml bowel sounds; no organomegaly or masses detected.   Musco: Warm bil, no deformities or joint swelling noted.   Neuro: alert, no focal deficits noted.    Skin: Warm, no  lesions or rashes    Lab Results:  CBC    Component Value Date/Time   WBC 11.0 (H) 03/14/2019 0448   RBC 4.08 03/14/2019 0448   HGB 11.8 (L) 03/14/2019 0448   HGB WILL FOLLOW 02/18/2019 1303   HCT 36.0 03/14/2019 0448   HCT WILL FOLLOW 02/18/2019 1303   PLT 428 (H) 03/14/2019 0448   PLT WILL FOLLOW 02/18/2019 1303   MCV 88.2 03/14/2019 0448   MCV WILL FOLLOW 02/18/2019 1303   MCH 28.9 03/14/2019 0448   MCHC 32.8 03/14/2019 0448   RDW 14.4 03/14/2019 0448   RDW WILL FOLLOW 02/18/2019 1303   LYMPHSABS 1.2 03/03/2019 1717   LYMPHSABS WILL FOLLOW 02/18/2019 1303   MONOABS 1.1 (H) 03/03/2019 1717   EOSABS 0.7 (H) 03/03/2019 1717   EOSABS WILL FOLLOW 02/18/2019 1303   BASOSABS 0.0 03/03/2019 1717   BASOSABS WILL FOLLOW 02/18/2019 1303    BMET    Component Value Date/Time   NA 135 03/15/2019 0722   NA 139 02/18/2019 1303   K 4.5 03/15/2019 0722   CL 106 03/15/2019 0722   CO2 19 (L) 03/15/2019 0722   GLUCOSE 75 03/15/2019 0722   BUN 52 (H) 03/15/2019 0722   BUN 46 (H) 02/18/2019 1303   CREATININE 1.25 (H) 03/15/2019 0722   CREATININE 0.97 03/30/2015 1348   CALCIUM 9.6 03/15/2019 0722   GFRNONAA 45 (L) 03/15/2019 0722   GFRAA 52 (L) 03/15/2019 0722    BNP    Component Value Date/Time   BNP 50.8 03/03/2019 2357   BNP 25.5 04/08/2015 0753    ProBNP No results found for: PROBNP  Imaging: Dg Chest 2 View  Result Date: 03/03/2019 CLINICAL DATA:  Pneumonia.  Short of breath. EXAM: CHEST - 2 VIEW COMPARISON:  02/17/2019 FINDINGS: Mild cardiac enlargement. Pulmonary vascular congestion identified. Lungs are hyperinflated and there are coarsened interstitial markings noted bilaterally. Pleural thickening along the major fissure is again noted. Scar or atelectasis noted in the right middle lobe. IMPRESSION: Cardiac enlargement and mild pulmonary vascular congestion with thickening along the major  fissure of the right lung. Findings may reflect early CHF. No airspace  consolidation identified. Electronically Signed   By: Kerby Moors M.D.   On: 03/03/2019 17:55   Dg Chest 2 View  Result Date: 02/18/2019 CLINICAL DATA:  Dyspnea on exertion.  Pulmonary hypertension. EXAM: CHEST - 2 VIEW COMPARISON:  None. FINDINGS: Normal heart size. Normal mediastinal contour. No pneumothorax. No pleural effusion. Patchy right middle lobe opacity. No pulmonary edema. IMPRESSION: Patchy right middle lobe opacity suggests a pneumonia. Recommend follow-up PA and lateral post treatment chest radiographs in 4-6 weeks. Electronically Signed   By: Ilona Sorrel M.D.   On: 02/18/2019 08:47   Ct Chest Wo Contrast  Result Date: 03/05/2019 CLINICAL DATA:  Acute hypoxemic respiratory failure EXAM: CT CHEST WITHOUT CONTRAST TECHNIQUE: Multidetector CT imaging of the chest was performed following the standard protocol without IV contrast. COMPARISON:  CT 04/05/2015 FINDINGS: Cardiovascular: Normal heart size. No pericardial effusion. Atherosclerotic calcification of the aortic leaflets is noted. There is mitral annular calcification is well. The aorta is normal caliber with atheromatous plaque. Additional calcifications are present in the proximal great vessels and upper abdominal aorta. Major venous structures are unremarkable. Mediastinum/Nodes: Numerous though nonenlarged mediastinal nodes are present. Evaluation for hilar adenopathy is limited in the absence of contrast. No axillary adenopathy. Thyroid gland and thoracic inlet are unremarkable. Bowing of the posterior trachea 0 likely related to imaging during exhalation. There is mild airways thickening. The esophagus is unremarkable. Lungs/Pleura: Redemonstration of mosaic attenuation throughout the lungs superimposed on centrilobular and paraseptal emphysema. There is increasing bandlike areas of subsegmental atelectasis and/or scarring as well as additional atelectatic change posteriorly in the lung bases. No pneumothorax. No effusion. Stable  subpleural area of scarring is seen in the right middle lobe. Upper Abdomen: Small accessory splenule. Mild bilateral symmetric perinephric stranding, a nonspecific finding though may correlate with either age or decreased renal function. Portion of the hepatic flexure is interposed anterior to the liver. Fatty replacement of the pancreas. No pancreatic ductal dilatation or surrounding inflammatory changes. No acute abnormalities present in the visualized portions of the upper abdomen. Musculoskeletal: Multilevel degenerative changes are present in the imaged portions of the spine. No acute osseous abnormality or suspicious osseous lesion. IMPRESSION: 1. Extensive atelectatic changes are present throughout the lungs with a dependent predominance. 2. Redemonstration of mosaic attenuation throughout the lungs superimposed on centrilobular and paraseptal emphysema, suggestive of small airways disease. 3. Mild bilateral symmetric perinephric stranding, a nonspecific finding though may correlate with either age or decreased renal function. 4. Aortic Atherosclerosis (ICD10-I70.0) and Emphysema (ICD10-J43.9). Electronically Signed   By: Lovena Le M.D.   On: 03/05/2019 22:41   Nm Pulmonary Perf And Vent  Result Date: 03/04/2019 CLINICAL DATA:  Evaluate for pulmonary embolism. Short of breath with hypertension. EXAM: NUCLEAR MEDICINE VENTILATION - PERFUSION LUNG SCAN TECHNIQUE: Ventilation images were obtained in multiple projections using inhaled aerosol Tc-31m DTPA. Perfusion images were obtained in multiple projections after intravenous injection of Tc-21m MAA. RADIOPHARMACEUTICALS:  32.7 mCi of Tc-9m DTPA aerosol inhalation and 1.53 mCi Tc56m MAA IV COMPARISON:  Chest radiograph 03/03/2019 FINDINGS: Ventilation: Large segmental ventilation defect identified involving the superior segment of left lower lobe. Medium to large segmental perfusion defect identified within the lateral left lower lobe. Perfusion:  Corresponding perfusion abnormalities are noted within the superior segment of left lower lobe and lateral left lower lobe. IMPRESSION: No unmatched segmental perfusion defects identified to suggest acute pulmonary embolus. Electronically Signed   By: Lovena Le  Bradly Chris M.D.   On: 03/04/2019 15:54   Dg Chest Port 1 View  Result Date: 03/11/2019 CLINICAL DATA:  68 year old female with a proxy. EXAM: PORTABLE CHEST 1 VIEW COMPARISON:  Chest radiograph dated 03/05/2019 FINDINGS: Chronic interstitial coarsening and mild bronchitic changes. No focal consolidation, pleural effusion, or pneumothorax. Linear density in the left mid lung field, likely atelectatic changes. The cardiac silhouette is within normal limits. No acute osseous pathology. IMPRESSION: No active disease. Electronically Signed   By: Elgie Collard M.D.   On: 03/11/2019 10:21   Dg Chest Port 1 View  Result Date: 03/05/2019 CLINICAL DATA:  Hypoxia EXAM: PORTABLE CHEST 1 VIEW COMPARISON:  03/03/2019 FINDINGS: Cardiomegaly. Mild irregular scarring or atelectasis of the mid lungs bilaterally. No acute appearing airspace opacity. IMPRESSION: Cardiomegaly. Mild irregular scarring or atelectasis of the mid lungs bilaterally. No acute appearing airspace opacity. Electronically Signed   By: Lauralyn Primes M.D.   On: 03/05/2019 15:07    regadenoson (LEXISCAN) injection SOLN 0.4 mg    Date Action Dose Route User   Discharged on 03/16/2019   Admitted on 03/03/2019   02/02/2019 1130 Given 0.4 mg Intravenous Henrietta Dine    technetium tetrofosmin (TC-MYOVIEW) injection 10.7 millicurie    Date Action Dose Route User   Discharged on 03/16/2019   Admitted on 03/03/2019   02/02/2019 1000 Contrast Given 10.7 millicurie Intravenous Henrietta Dine    technetium tetrofosmin (TC-MYOVIEW) injection 32.5 millicurie    Date Action Dose Route User   Discharged on 03/16/2019   Admitted on 03/03/2019   02/02/2019 1130 Contrast Given 32.5 millicurie Intravenous Doyne Keel M      PFT Results Latest Ref Rng & Units 04/04/2015  FVC-Pre L 2.63  FVC-Predicted Pre % 95  FVC-Post L 2.42  FVC-Predicted Post % 87  Pre FEV1/FVC % % 79  Post FEV1/FCV % % 88  FEV1-Pre L 2.08  FEV1-Predicted Pre % 98  FEV1-Post L 2.14  DLCO UNC% % 68  DLCO COR %Predicted % 82  TLC L 4.31  TLC % Predicted % 96  RV % Predicted % 90    No results found for: NITRICOXIDE      Assessment & Plan:   Diastolic CHF (HCC) Recent hospitalization for decompensated diastolic heart failure. Patient improved clinically with aggressive diuresis. Advised patient to follow-up with cardiology in the next several weeks.  Continue on a low-salt diet.  Continue Lasix daily. Continue to keep daily log of weights.  Plan  Patient Instructions  Weigh daily , keep log  Low salt diet .  Continue on Lasix 20mg  daily  Follow up with Primary MD this week as planned.  Follow up with Dr. Mayford Knife as planned.  Continue on CPAP At bedtime   Work on healthy weight loss .  Please avoid NSAIDS -ibuprofen , aleve, advil , etc.  Follow up with Dr. Maple Hudson or Xayvier Vallez NP in 4 weeks with PFT and 6 min walk  Please contact office for sooner follow up if symptoms do not improve or worsen or seek emergency care       Pulmonary hypertension No significant pulmonary hypertension noted on 2D echo.  RV systolic function appeared normal with normal RV size. Patient appears to be well controlled on her CPAP. Autoimmune work-up during hospitalization was negative. Continue with diuresis. We will check PFT and 6-minute walk test on return  Dyspnea on exertion Suspect is multifactorial.  With underlying diastolic heart failure, morbid obesity and deconditioning. Patient was a  preemie.  Has secondhand smoke.  Will check PFTs on return.  Check 6-minute walk on return. For now can discontinue Anoro.  Plan  Patient Instructions  Weigh daily , keep log  Low salt diet .  Continue on Lasix 20mg  daily    Follow up with Primary MD this week as planned.  Follow up with Dr. Mayford Knifeurner as planned.  Continue on CPAP At bedtime   Work on healthy weight loss .  Please avoid NSAIDS -ibuprofen , aleve, advil , etc.  Follow up with Dr. Maple HudsonYoung or Wyvonne Carda NP in 4 weeks with PFT and 6 min walk  Please contact office for sooner follow up if symptoms do not improve or worsen or seek emergency care       Morbid obesity (HCC) Healthy weight loss  OSA (obstructive sleep apnea) Appears compliant with CPAP.  Will check a CPAP download to make sure control is optimal. Continue follow-up with cardiology     Rubye Oaksammy Brenda Cowher, NP 03/18/2019

## 2019-03-18 NOTE — Addendum Note (Signed)
Addended by: Parke Poisson E on: 03/18/2019 12:42 PM   Modules accepted: Orders

## 2019-03-18 NOTE — Assessment & Plan Note (Signed)
Healthy weight loss 

## 2019-03-18 NOTE — Telephone Encounter (Signed)
LM requesting call back to complete TCM and schedule hospital follow up.   

## 2019-03-18 NOTE — Assessment & Plan Note (Signed)
No significant pulmonary hypertension noted on 2D echo.  RV systolic function appeared normal with normal RV size. Patient appears to be well controlled on her CPAP. Autoimmune work-up during hospitalization was negative. Continue with diuresis. We will check PFT and 6-minute walk test on return

## 2019-03-20 ENCOUNTER — Ambulatory Visit (INDEPENDENT_AMBULATORY_CARE_PROVIDER_SITE_OTHER): Payer: Medicare Other | Admitting: Family Medicine

## 2019-03-20 ENCOUNTER — Telehealth: Payer: Self-pay | Admitting: *Deleted

## 2019-03-20 ENCOUNTER — Other Ambulatory Visit: Payer: Self-pay | Admitting: Pharmacist

## 2019-03-20 ENCOUNTER — Encounter: Payer: Self-pay | Admitting: Family Medicine

## 2019-03-20 ENCOUNTER — Other Ambulatory Visit: Payer: Self-pay

## 2019-03-20 VITALS — BP 132/54 | HR 65 | Resp 16 | Ht 60.0 in | Wt 207.0 lb

## 2019-03-20 DIAGNOSIS — I10 Essential (primary) hypertension: Secondary | ICD-10-CM | POA: Diagnosis not present

## 2019-03-20 DIAGNOSIS — J9601 Acute respiratory failure with hypoxia: Secondary | ICD-10-CM | POA: Diagnosis not present

## 2019-03-20 DIAGNOSIS — N183 Chronic kidney disease, stage 3 (moderate): Secondary | ICD-10-CM | POA: Diagnosis not present

## 2019-03-20 DIAGNOSIS — Z7984 Long term (current) use of oral hypoglycemic drugs: Secondary | ICD-10-CM | POA: Diagnosis not present

## 2019-03-20 DIAGNOSIS — E1122 Type 2 diabetes mellitus with diabetic chronic kidney disease: Secondary | ICD-10-CM | POA: Diagnosis not present

## 2019-03-20 DIAGNOSIS — E119 Type 2 diabetes mellitus without complications: Secondary | ICD-10-CM | POA: Diagnosis not present

## 2019-03-20 DIAGNOSIS — E7800 Pure hypercholesterolemia, unspecified: Secondary | ICD-10-CM | POA: Diagnosis not present

## 2019-03-20 DIAGNOSIS — Z6839 Body mass index (BMI) 39.0-39.9, adult: Secondary | ICD-10-CM | POA: Diagnosis not present

## 2019-03-20 DIAGNOSIS — Z9989 Dependence on other enabling machines and devices: Secondary | ICD-10-CM | POA: Diagnosis not present

## 2019-03-20 DIAGNOSIS — N179 Acute kidney failure, unspecified: Secondary | ICD-10-CM

## 2019-03-20 DIAGNOSIS — G4733 Obstructive sleep apnea (adult) (pediatric): Secondary | ICD-10-CM | POA: Diagnosis not present

## 2019-03-20 DIAGNOSIS — I272 Pulmonary hypertension, unspecified: Secondary | ICD-10-CM | POA: Diagnosis not present

## 2019-03-20 DIAGNOSIS — Z8709 Personal history of other diseases of the respiratory system: Secondary | ICD-10-CM | POA: Diagnosis not present

## 2019-03-20 DIAGNOSIS — F329 Major depressive disorder, single episode, unspecified: Secondary | ICD-10-CM | POA: Diagnosis not present

## 2019-03-20 DIAGNOSIS — I5033 Acute on chronic diastolic (congestive) heart failure: Secondary | ICD-10-CM | POA: Diagnosis not present

## 2019-03-20 DIAGNOSIS — I130 Hypertensive heart and chronic kidney disease with heart failure and stage 1 through stage 4 chronic kidney disease, or unspecified chronic kidney disease: Secondary | ICD-10-CM | POA: Diagnosis not present

## 2019-03-20 DIAGNOSIS — J449 Chronic obstructive pulmonary disease, unspecified: Secondary | ICD-10-CM | POA: Diagnosis not present

## 2019-03-20 LAB — BASIC METABOLIC PANEL
BUN: 60 mg/dL — ABNORMAL HIGH (ref 6–23)
CO2: 23 mEq/L (ref 19–32)
Calcium: 10.6 mg/dL — ABNORMAL HIGH (ref 8.4–10.5)
Chloride: 106 mEq/L (ref 96–112)
Creatinine, Ser: 1.44 mg/dL — ABNORMAL HIGH (ref 0.40–1.20)
GFR: 36.31 mL/min — ABNORMAL LOW (ref >60.00)
Glucose, Bld: 229 mg/dL — ABNORMAL HIGH (ref 70–99)
Potassium: 4.8 mEq/L (ref 3.5–5.1)
Sodium: 141 mEq/L (ref 135–145)

## 2019-03-20 LAB — CBC WITH DIFFERENTIAL/PLATELET
Basophils Absolute: 0.1 10*3/uL (ref 0.0–0.1)
Basophils Relative: 1.2 % (ref 0.0–3.0)
Eosinophils Absolute: 0.4 10*3/uL (ref 0.0–0.7)
Eosinophils Relative: 4.2 % (ref 0.0–5.0)
HCT: 36.6 % (ref 36.0–46.0)
Hemoglobin: 12 g/dL (ref 12.0–15.0)
Lymphocytes Relative: 18.6 % (ref 12.0–46.0)
Lymphs Abs: 1.7 10*3/uL (ref 0.7–4.0)
MCHC: 32.8 g/dL (ref 30.0–36.0)
MCV: 90.2 fl (ref 78.0–100.0)
Monocytes Absolute: 0.8 10*3/uL (ref 0.1–1.0)
Monocytes Relative: 8.5 % (ref 3.0–12.0)
Neutro Abs: 6.3 10*3/uL (ref 1.4–7.7)
Neutrophils Relative %: 67.5 % (ref 43.0–77.0)
Platelets: 339 10*3/uL (ref 150.0–400.0)
RBC: 4.05 Mil/uL (ref 3.87–5.11)
RDW: 15.2 % (ref 11.5–15.5)
WBC: 9.3 10*3/uL (ref 4.0–10.5)

## 2019-03-20 LAB — GLUCOSE, POCT (MANUAL RESULT ENTRY): POC Glucose: 224 mg/dl — AB (ref 70–99)

## 2019-03-20 MED ORDER — CICLOPIROX 8 % EX SOLN: Freq: Every day | CUTANEOUS | 0 refills | Status: DC

## 2019-03-20 NOTE — Patient Instructions (Signed)
Follow up as scheduled or sooner if needed We'll notify you of your lab results and make any changes if needed We'll call you with your cardiology and endocrinology appts No med changes at this time APPLY the Ciclopirox to the nail as directed Call with any questions or concerns Hang in there!

## 2019-03-20 NOTE — Patient Outreach (Signed)
[facility]) Care Management  03/20/2019  Katrisha Segall 24-Jul-1950 [id number]   Called patient back after her provider appointment about medication assistance. HIPAA identifiers were obtained. Patient said she wanted to hold off on the patient assistance process at this time because she will be going to see an endocrinologist and does not know what her new therapy will be. She also reported that she will be going for a breathing study in October that will determine if she even needs to be on Anoro.    Patient said she would do a medication review and continue the patient assistance process if she needs to in the future and would reach back out to me.  Plan: Close patient's case per her request. Alert [facility] Nurse still involved in her care. Will gladly reopen the patient's case upon request or need.  Elayne Guerin, PharmD, [place] Clinical Pharmacist [phone]

## 2019-03-20 NOTE — Progress Notes (Signed)
   Subjective:    Patient ID: Kayla Brown, female    DOB: 1950/07/15, 68 y.o.   MRN: [id number]  [facility] f/u- pt was admitted 9/8-9/21 w/ SOB.  Pt was found to be volume overloaded and had pulmonary edema.  Improved w/ IV diuresis but then developed acute kidney injury (2.39).  Cr improved w/ IVF.  Today pt is down 17 lbs from last visit.  No evidence of PE.  She was started on Coreg 3.125mg  BID and Lasix.  Chlorthalidone, HCTZ, Spironolactone and Ramipril were d/c'd due to AKI.  Cards f/u recommended.  Actos was d/c'd due to diastolic CHF.  Pt reports feeling better but 'still weak'.  Breathing is 'much easier'.    Reviewed hospital notes, D/C summary, labs, imaging   Review of Systems For ROS see HPI     Objective:   Physical Exam Vitals signs reviewed.  Constitutional:      General: She is not in acute distress.    Appearance: She is well-developed. She is obese.  HENT:     Head: Normocephalic and atraumatic.  Eyes:     Conjunctiva/sclera: Conjunctivae normal.     Pupils: Pupils are equal, round, and reactive to light.  Neck:     Musculoskeletal: Normal range of motion and neck supple.     Thyroid: No thyromegaly.  Cardiovascular:     Rate and Rhythm: Normal rate and regular rhythm.     Heart sounds: Normal heart sounds. No murmur.  Pulmonary:     Effort: Pulmonary effort is normal. No respiratory distress.     Breath sounds: Normal breath sounds.  Abdominal:     General: There is no distension.     Palpations: Abdomen is soft.     Tenderness: There is no abdominal tenderness.  Musculoskeletal:     Right lower leg: No edema.     Left lower leg: No edema.  Lymphadenopathy:     Cervical: No cervical adenopathy.  Skin:    General: Skin is warm and dry.  Neurological:     Mental Status: She is alert and oriented to person, place, and time.  Psychiatric:        Behavior: Behavior normal.           Assessment & Plan:

## 2019-03-20 NOTE — Assessment & Plan Note (Signed)
Pt was started on Coreg and Lasix.  Had Chlorthalidone, HCTZ, Spironolactone, and Ramipril d/c'd due to increased Cr.  Actos stopped due to CHF.  Currently asymptomatic.  Refer to heart failure team

## 2019-03-20 NOTE — Telephone Encounter (Signed)
Nurse with [facility] called and they have done an evaluation on patient.  They feel like patient would benefit from OT and PT.  They do not need a verbal order, they were just going add her to their schedule.    If Dr. Birdie Riddle does not feel like she would be in agreement with OT/PT, nurse can be contacted to discontinue.    CB if needed:  Juliann Pulse [phone]

## 2019-03-20 NOTE — Assessment & Plan Note (Signed)
Resolved.  Was able to d/c from hospital w/o O2.  Respiratory failure was due to volume overload and she was diuresed 17 lbs.  Breathing is 'much easier' and she is in no distress today.  Continue Lasix.  Refer to Heart Failure team.  Pt expressed understanding and is in agreement w/ plan.

## 2019-03-20 NOTE — Assessment & Plan Note (Signed)
Chronic problem.  Adequate control today despite stopping multiple medications.  No changes at this time.

## 2019-03-20 NOTE — Assessment & Plan Note (Signed)
Occurred during hospitalization but Cr was normalizing by time of d/c.  Will repeat today.

## 2019-03-20 NOTE — Assessment & Plan Note (Signed)
Chronic problem.  A1C was 8.  Pt was told to find an endocrinologist.  Will refer to Dr Kelton Pillar for ongoing care as I think this will be a good fit for her.

## 2019-03-23 ENCOUNTER — Telehealth: Payer: Self-pay | Admitting: Cardiology

## 2019-03-23 ENCOUNTER — Other Ambulatory Visit: Payer: Self-pay | Admitting: Family Medicine

## 2019-03-23 DIAGNOSIS — I5033 Acute on chronic diastolic (congestive) heart failure: Secondary | ICD-10-CM | POA: Diagnosis not present

## 2019-03-23 DIAGNOSIS — N183 Chronic kidney disease, stage 3 (moderate): Secondary | ICD-10-CM | POA: Diagnosis not present

## 2019-03-23 DIAGNOSIS — J449 Chronic obstructive pulmonary disease, unspecified: Secondary | ICD-10-CM | POA: Diagnosis not present

## 2019-03-23 DIAGNOSIS — I130 Hypertensive heart and chronic kidney disease with heart failure and stage 1 through stage 4 chronic kidney disease, or unspecified chronic kidney disease: Secondary | ICD-10-CM | POA: Diagnosis not present

## 2019-03-23 DIAGNOSIS — J9601 Acute respiratory failure with hypoxia: Secondary | ICD-10-CM | POA: Diagnosis not present

## 2019-03-23 DIAGNOSIS — R7989 Other specified abnormal findings of blood chemistry: Secondary | ICD-10-CM

## 2019-03-23 DIAGNOSIS — E1122 Type 2 diabetes mellitus with diabetic chronic kidney disease: Secondary | ICD-10-CM | POA: Diagnosis not present

## 2019-03-23 NOTE — Telephone Encounter (Signed)
Called patient to schedule for Dr. Radford Pax and she stated she has found another cardiologist.

## 2019-03-24 ENCOUNTER — Telehealth: Payer: Self-pay

## 2019-03-24 DIAGNOSIS — E1122 Type 2 diabetes mellitus with diabetic chronic kidney disease: Secondary | ICD-10-CM | POA: Diagnosis not present

## 2019-03-24 DIAGNOSIS — I5033 Acute on chronic diastolic (congestive) heart failure: Secondary | ICD-10-CM | POA: Diagnosis not present

## 2019-03-24 DIAGNOSIS — I130 Hypertensive heart and chronic kidney disease with heart failure and stage 1 through stage 4 chronic kidney disease, or unspecified chronic kidney disease: Secondary | ICD-10-CM | POA: Diagnosis not present

## 2019-03-24 DIAGNOSIS — J449 Chronic obstructive pulmonary disease, unspecified: Secondary | ICD-10-CM | POA: Diagnosis not present

## 2019-03-24 DIAGNOSIS — J9601 Acute respiratory failure with hypoxia: Secondary | ICD-10-CM | POA: Diagnosis not present

## 2019-03-24 DIAGNOSIS — N183 Chronic kidney disease, stage 3 (moderate): Secondary | ICD-10-CM | POA: Diagnosis not present

## 2019-03-24 NOTE — Telephone Encounter (Signed)
Ok for orders? 

## 2019-03-24 NOTE — Telephone Encounter (Signed)
Called and verbal ok given.  

## 2019-03-24 NOTE — Telephone Encounter (Signed)
Sharyn Lull [phone] from [facility] called in requesting verbal orders for occupational therapy  Once a week for 1 week Twice a week for 3 weeks

## 2019-03-25 ENCOUNTER — Telehealth: Payer: Self-pay | Admitting: Family Medicine

## 2019-03-25 DIAGNOSIS — I5033 Acute on chronic diastolic (congestive) heart failure: Secondary | ICD-10-CM | POA: Diagnosis not present

## 2019-03-25 DIAGNOSIS — J9601 Acute respiratory failure with hypoxia: Secondary | ICD-10-CM | POA: Diagnosis not present

## 2019-03-25 DIAGNOSIS — I130 Hypertensive heart and chronic kidney disease with heart failure and stage 1 through stage 4 chronic kidney disease, or unspecified chronic kidney disease: Secondary | ICD-10-CM | POA: Diagnosis not present

## 2019-03-25 DIAGNOSIS — E1122 Type 2 diabetes mellitus with diabetic chronic kidney disease: Secondary | ICD-10-CM | POA: Diagnosis not present

## 2019-03-25 DIAGNOSIS — N183 Chronic kidney disease, stage 3 (moderate): Secondary | ICD-10-CM | POA: Diagnosis not present

## 2019-03-25 DIAGNOSIS — J449 Chronic obstructive pulmonary disease, unspecified: Secondary | ICD-10-CM | POA: Diagnosis not present

## 2019-03-25 NOTE — Telephone Encounter (Signed)
Ok for verbal orders ?

## 2019-03-25 NOTE — Telephone Encounter (Signed)
Home Health was calling in to get verbal orders for patient for PT. [phone]

## 2019-03-25 NOTE — Telephone Encounter (Signed)
Called and verbal ok given.  

## 2019-03-25 NOTE — Telephone Encounter (Signed)
Ok for orders? 

## 2019-03-26 DIAGNOSIS — J9601 Acute respiratory failure with hypoxia: Secondary | ICD-10-CM | POA: Diagnosis not present

## 2019-03-26 DIAGNOSIS — I130 Hypertensive heart and chronic kidney disease with heart failure and stage 1 through stage 4 chronic kidney disease, or unspecified chronic kidney disease: Secondary | ICD-10-CM | POA: Diagnosis not present

## 2019-03-26 DIAGNOSIS — E1122 Type 2 diabetes mellitus with diabetic chronic kidney disease: Secondary | ICD-10-CM | POA: Diagnosis not present

## 2019-03-26 DIAGNOSIS — I5033 Acute on chronic diastolic (congestive) heart failure: Secondary | ICD-10-CM | POA: Diagnosis not present

## 2019-03-26 DIAGNOSIS — N183 Chronic kidney disease, stage 3 (moderate): Secondary | ICD-10-CM | POA: Diagnosis not present

## 2019-03-26 DIAGNOSIS — J449 Chronic obstructive pulmonary disease, unspecified: Secondary | ICD-10-CM | POA: Diagnosis not present

## 2019-03-27 ENCOUNTER — Other Ambulatory Visit: Payer: Self-pay | Admitting: *Deleted

## 2019-03-27 ENCOUNTER — Other Ambulatory Visit: Payer: Medicare Other

## 2019-03-27 ENCOUNTER — Ambulatory Visit: Payer: Self-pay

## 2019-03-27 ENCOUNTER — Encounter: Payer: Self-pay | Admitting: *Deleted

## 2019-03-27 ENCOUNTER — Other Ambulatory Visit: Payer: Self-pay

## 2019-03-27 ENCOUNTER — Ambulatory Visit (INDEPENDENT_AMBULATORY_CARE_PROVIDER_SITE_OTHER): Payer: Medicare Other

## 2019-03-27 DIAGNOSIS — I130 Hypertensive heart and chronic kidney disease with heart failure and stage 1 through stage 4 chronic kidney disease, or unspecified chronic kidney disease: Secondary | ICD-10-CM | POA: Diagnosis not present

## 2019-03-27 DIAGNOSIS — J449 Chronic obstructive pulmonary disease, unspecified: Secondary | ICD-10-CM | POA: Diagnosis not present

## 2019-03-27 DIAGNOSIS — R7989 Other specified abnormal findings of blood chemistry: Secondary | ICD-10-CM

## 2019-03-27 DIAGNOSIS — I5033 Acute on chronic diastolic (congestive) heart failure: Secondary | ICD-10-CM | POA: Diagnosis not present

## 2019-03-27 DIAGNOSIS — N183 Chronic kidney disease, stage 3 (moderate): Secondary | ICD-10-CM | POA: Diagnosis not present

## 2019-03-27 DIAGNOSIS — J9601 Acute respiratory failure with hypoxia: Secondary | ICD-10-CM | POA: Diagnosis not present

## 2019-03-27 DIAGNOSIS — E1122 Type 2 diabetes mellitus with diabetic chronic kidney disease: Secondary | ICD-10-CM | POA: Diagnosis not present

## 2019-03-27 LAB — BASIC METABOLIC PANEL
BUN: 37 mg/dL — ABNORMAL HIGH (ref 6–23)
CO2: 22 mEq/L (ref 19–32)
Calcium: 10.2 mg/dL (ref 8.4–10.5)
Chloride: 109 mEq/L (ref 96–112)
Creatinine, Ser: 1.27 mg/dL — ABNORMAL HIGH (ref 0.40–1.20)
GFR: 41.97 mL/min — ABNORMAL LOW (ref >60.00)
Glucose, Bld: 103 mg/dL — ABNORMAL HIGH (ref 70–99)
Potassium: 4.5 mEq/L (ref 3.5–5.1)
Sodium: 143 mEq/L (ref 135–145)

## 2019-03-27 NOTE — Patient Outreach (Addendum)
[facility]) Care Management  03/27/2019  Kayla Brown 1951-01-16 [id number]    Telephone Assessment-HF  RN spoke with pt today and explained the purpose for today's call (pt receptive). Inquired if this was a good time to talk and further discussed her medical issues and recent hospitalization. Pt states she has a visiting nurse who is address her new diagnosis of HF. States she has material and has been weighing daily. RN further inquired and educated pt on the HF zones and awareness if she gains 2 lbs overnight or 5 lbs within one week to contact her provider who is following her HF at this time (Dr. Birdie Riddle). States she recently had a visit on 9/25 with her provider and all her medications were reviewed. RN requested to review medications once again for an update (pt agreed). All medications reviewed with some adjustments. Further discussed pt's HF and offered to send printed material on HF for further education on managing this condition (pt receptive).   Based upon the conversation today RN offered to enroll pt into the HF program and further education on managing this condition (pt agreed). Initial assessment information was obtained. Goals and interventions were discussed and noted with pt understanding on the purpose for all discussed today to assist pt in again managing this condition related to CHF. Pt is aware her primary provided will be notified of her disposition for participating in the program along with the other available services if needed for social work and pharmacy. Pt very appreciative and grateful for all discussed today and aware RN will follow up in a few weeks for an update.   THN CM Care Plan Problem One     Most Recent Value  Care Plan Problem One  Knowledge Deficit realted to CHF exacerbation  Role Documenting the Problem One  Care Management Coordinator  Care Plan for Problem One  Active  THN Long Term Goal   Pt will verbalize two symptoms of CHF  exacerbation within the next 90 days.  THN Long Term Goal Start Date  03/27/19  Interventions for Problem One Long Term Goal  Will discussed HF zones and verified pt remains in the GREEN. Will offer to send printed material on HF and encouraged pt to review. Will educate pt on what to do if acute symptoms are encountered.   THN CM Short Term Goal #1   Pt will verbalize the plan of action in the YELLOW zone within the next 30 days.  THN CM Short Term Goal #1 Start Date  03/27/19  Interventions for Short Term Goal #1  Will discussed the action plan and what to do if acute symtpoms should occur by contact her provider with any precipitating symptoms.   THN CM Short Term Goal #2   Pt will maintain daily weights and note all readings for her providers within the next 30 days.  THN CM Short Term Goal #2 Start Date  03/27/19  Interventions for Short Term Goal #2  Will stress the importance of fluid retention and how this affects her heart. Will also stress the importance of daily weights and when to report  abnormal symptoms or increase weights to her provider. Will further educate on if 2 lbs overnight or 5 lbs within one week is encountered to contact her provider with any HF sypmtoms that have been reviewed today.       [name], RN Care Management Coordinator [facility] [phone]

## 2019-03-30 ENCOUNTER — Encounter: Payer: Self-pay | Admitting: General Practice

## 2019-03-30 DIAGNOSIS — E1122 Type 2 diabetes mellitus with diabetic chronic kidney disease: Secondary | ICD-10-CM | POA: Diagnosis not present

## 2019-03-30 DIAGNOSIS — I130 Hypertensive heart and chronic kidney disease with heart failure and stage 1 through stage 4 chronic kidney disease, or unspecified chronic kidney disease: Secondary | ICD-10-CM | POA: Diagnosis not present

## 2019-03-30 DIAGNOSIS — J9601 Acute respiratory failure with hypoxia: Secondary | ICD-10-CM | POA: Diagnosis not present

## 2019-03-30 DIAGNOSIS — N183 Chronic kidney disease, stage 3 (moderate): Secondary | ICD-10-CM | POA: Diagnosis not present

## 2019-03-30 DIAGNOSIS — I5033 Acute on chronic diastolic (congestive) heart failure: Secondary | ICD-10-CM | POA: Diagnosis not present

## 2019-03-30 DIAGNOSIS — J449 Chronic obstructive pulmonary disease, unspecified: Secondary | ICD-10-CM | POA: Diagnosis not present

## 2019-03-31 ENCOUNTER — Other Ambulatory Visit: Payer: Self-pay | Admitting: Family Medicine

## 2019-03-31 DIAGNOSIS — E1122 Type 2 diabetes mellitus with diabetic chronic kidney disease: Secondary | ICD-10-CM | POA: Diagnosis not present

## 2019-03-31 DIAGNOSIS — J449 Chronic obstructive pulmonary disease, unspecified: Secondary | ICD-10-CM | POA: Diagnosis not present

## 2019-03-31 DIAGNOSIS — I130 Hypertensive heart and chronic kidney disease with heart failure and stage 1 through stage 4 chronic kidney disease, or unspecified chronic kidney disease: Secondary | ICD-10-CM | POA: Diagnosis not present

## 2019-03-31 DIAGNOSIS — I5033 Acute on chronic diastolic (congestive) heart failure: Secondary | ICD-10-CM | POA: Diagnosis not present

## 2019-03-31 DIAGNOSIS — J9601 Acute respiratory failure with hypoxia: Secondary | ICD-10-CM | POA: Diagnosis not present

## 2019-03-31 DIAGNOSIS — N183 Chronic kidney disease, stage 3 (moderate): Secondary | ICD-10-CM | POA: Diagnosis not present

## 2019-04-01 ENCOUNTER — Other Ambulatory Visit: Payer: Self-pay

## 2019-04-01 DIAGNOSIS — J9601 Acute respiratory failure with hypoxia: Secondary | ICD-10-CM | POA: Diagnosis not present

## 2019-04-01 DIAGNOSIS — E1122 Type 2 diabetes mellitus with diabetic chronic kidney disease: Secondary | ICD-10-CM | POA: Diagnosis not present

## 2019-04-01 DIAGNOSIS — I130 Hypertensive heart and chronic kidney disease with heart failure and stage 1 through stage 4 chronic kidney disease, or unspecified chronic kidney disease: Secondary | ICD-10-CM | POA: Diagnosis not present

## 2019-04-01 DIAGNOSIS — I5033 Acute on chronic diastolic (congestive) heart failure: Secondary | ICD-10-CM | POA: Diagnosis not present

## 2019-04-01 DIAGNOSIS — J449 Chronic obstructive pulmonary disease, unspecified: Secondary | ICD-10-CM | POA: Diagnosis not present

## 2019-04-01 DIAGNOSIS — N183 Chronic kidney disease, stage 3 (moderate): Secondary | ICD-10-CM | POA: Diagnosis not present

## 2019-04-02 ENCOUNTER — Ambulatory Visit: Payer: Medicare Other | Admitting: Pharmacist

## 2019-04-02 ENCOUNTER — Telehealth: Payer: Self-pay | Admitting: Family Medicine

## 2019-04-02 NOTE — Telephone Encounter (Signed)
Paperwork given to PCP for completion.  

## 2019-04-02 NOTE — Telephone Encounter (Signed)
I have placed a HH cert and plan of care in the bin upfront with a charge sheet.    

## 2019-04-03 ENCOUNTER — Encounter: Payer: Self-pay | Admitting: Internal Medicine

## 2019-04-03 ENCOUNTER — Other Ambulatory Visit: Payer: Self-pay

## 2019-04-03 ENCOUNTER — Ambulatory Visit (INDEPENDENT_AMBULATORY_CARE_PROVIDER_SITE_OTHER): Payer: Medicare Other | Admitting: Internal Medicine

## 2019-04-03 VITALS — BP 138/78 | HR 67 | Temp 98.7°F | Ht 60.0 in | Wt 207.2 lb

## 2019-04-03 DIAGNOSIS — N1832 Chronic kidney disease, stage 3b: Secondary | ICD-10-CM | POA: Diagnosis not present

## 2019-04-03 DIAGNOSIS — N183 Chronic kidney disease, stage 3 (moderate): Secondary | ICD-10-CM | POA: Diagnosis not present

## 2019-04-03 DIAGNOSIS — E1121 Type 2 diabetes mellitus with diabetic nephropathy: Secondary | ICD-10-CM | POA: Diagnosis not present

## 2019-04-03 DIAGNOSIS — E1122 Type 2 diabetes mellitus with diabetic chronic kidney disease: Secondary | ICD-10-CM

## 2019-04-03 DIAGNOSIS — E1165 Type 2 diabetes mellitus with hyperglycemia: Secondary | ICD-10-CM

## 2019-04-03 DIAGNOSIS — Z794 Long term (current) use of insulin: Secondary | ICD-10-CM

## 2019-04-03 DIAGNOSIS — J449 Chronic obstructive pulmonary disease, unspecified: Secondary | ICD-10-CM | POA: Diagnosis not present

## 2019-04-03 DIAGNOSIS — I5033 Acute on chronic diastolic (congestive) heart failure: Secondary | ICD-10-CM | POA: Diagnosis not present

## 2019-04-03 DIAGNOSIS — I130 Hypertensive heart and chronic kidney disease with heart failure and stage 1 through stage 4 chronic kidney disease, or unspecified chronic kidney disease: Secondary | ICD-10-CM | POA: Diagnosis not present

## 2019-04-03 DIAGNOSIS — J9601 Acute respiratory failure with hypoxia: Secondary | ICD-10-CM | POA: Diagnosis not present

## 2019-04-03 LAB — BASIC METABOLIC PANEL
BUN: 35 mg/dL — ABNORMAL HIGH (ref 6–23)
CO2: 24 mEq/L (ref 19–32)
Calcium: 10.2 mg/dL (ref 8.4–10.5)
Chloride: 108 mEq/L (ref 96–112)
Creatinine, Ser: 1.19 mg/dL (ref 0.40–1.20)
GFR: 45.24 mL/min — ABNORMAL LOW (ref >60.00)
Glucose, Bld: 122 mg/dL — ABNORMAL HIGH (ref 70–99)
Potassium: 4.4 mEq/L (ref 3.5–5.1)
Sodium: 142 mEq/L (ref 135–145)

## 2019-04-03 MED ORDER — GLIPIZIDE 5 MG PO TABS: ORAL_TABLET | ORAL | 3 refills | Status: DC

## 2019-04-03 NOTE — Patient Instructions (Addendum)
-   STOP Glipizide XL  - Start (Regular) Glipizide 1 tablet Before breakfast and Half a tablet Before Supper    - Continue Metformin 2 tablets with Breakfast and 2 tablets before Supper  - Check sugar before Breakfast and before supper  Fasting sugar goal is less then 150 During the day sugar goal is less then 180    - HOW TO TREAT LOW BLOOD SUGARS (Blood sugar LESS THAN 70 MG/DL)  Please follow the RULE OF 15 for the treatment of hypoglycemia treatment (when your (blood sugars are less than 70 mg/dL)    STEP 1: Take 15 grams of carbohydrates when your blood sugar is low, which includes:   3-4 GLUCOSE TABS  OR  3-4 OZ OF JUICE OR REGULAR SODA OR  ONE TUBE OF GLUCOSE GEL     STEP 2: RECHECK blood sugar in 15 MINUTES STEP 3: If your blood sugar is still low at the 15 minute recheck --> then, go back to STEP 1 and treat AGAIN with another 15 grams of carbohydrates.

## 2019-04-03 NOTE — Progress Notes (Signed)
Name: Kayla MirzaVickie Brown  MRN/ DOB: [id number], 08/31/1950   Age/ Sex: 68 y.o., female    PCP: Sheliah Hatchabori, Katherine E, MD   Reason for Endocrinology Evaluation: Type 2 Diabetes Mellitus     Date of Initial Endocrinology Visit: 04/06/2019     PATIENT IDENTIFIER: Kayla Brown is a 68 y.o. female with a past medical history of HTN, CHF and T2DM . The patient presented for initial endocrinology clinic visit on 04/06/2019 for consultative assistance with her diabetes management.    HPI: Kayla Brown was    Diagnosed with T2DM 2001 Prior Medications tried/Intolerance: Jardiance- cost  , Actos- CHF Currently checking blood sugars 5 x / day,  before meals   Hypoglycemia episodes : yes        Symptoms: wobbly, weak       Frequency: 2/ week  Hemoglobin A1c has ranged from 6.7% in 2017, peaking at 8.1% in 2020. Patient required assistance for hypoglycemia: no  Patient has required hospitalization within the last 1 year from hyper or hypoglycemia:   no  In terms of diet, the patient eats 3 meals and snacks 3 x per dietician recommendations    HOME DIABETES REGIMEN: Glipizide XL 10 mg daily with  Metformin 500 mg 2 tabs BID   Statin: Yes ACE-I/ARB: Yes Prior Diabetic Education: Yes   METER DOWNLOAD SUMMARY:  55- 166 mg/dL     DIABETIC COMPLICATIONS: Microvascular complications:   CKD   Denies: neuropathy , retinopathy   Last eye exam: Completed 06/2018  Macrovascular complications:   CHF  Denies: CAD, PVD, CVA   PAST HISTORY: Past Medical History:  Past Medical History:  Diagnosis Date  . Anxiety   . Diabetes mellitus   . Edema of extremities 03/02/2016  . Essential hypertension, benign   . Lazy eye   . Major depressive disorder, single episode, unspecified   . Obesity (BMI 30-39.9) 03/01/2016  . OSA (obstructive Brown apnea)   . Pulmonary HTN (HCC)    mild with PASP 40mmHg by echo 05/2016  . Pure hypercholesterolemia   . Retinopathy, due to hypertension    Past  Surgical History:  Past Surgical History:  Procedure Laterality Date  . CPAP TITRATION  10/14/2015  . DILATION AND CURETTAGE OF UTERUS  2005-2008   x2  . EYE SURGERY     cataracts  . WISDOM TOOTH EXTRACTION        Social History:  reports that she has never smoked. She has never used smokeless tobacco. She reports that she does not drink alcohol or use drugs. Family History:  Family History  Adopted: Yes  Problem Relation Age of Onset  . Alcohol abuse Mother   . ADD / ADHD Mother   . Dementia Mother   . Breast cancer Neg Hx      HOME MEDICATIONS: Allergies as of 04/03/2019   No Known Allergies     Medication List       Accurate as of April 03, 2019 11:59 PM. If you have any questions, ask your nurse or doctor.        STOP taking these medications   glipiZIDE 10 MG 24 hr tablet Commonly known as: GLUCOTROL XL Replaced by: glipiZIDE 5 MG tablet Stopped by: Scarlette ShortsIbtehal J , MD   Jardiance 10 MG Tabs tablet Generic drug: empagliflozin Stopped by: Scarlette ShortsIbtehal J , MD   ramipril 10 MG capsule Commonly known as: ALTACE Stopped by: Scarlette ShortsIbtehal J , MD   Suvorexant 10 MG Tabs Commonly known  as: Belsomra Stopped by: Scarlette Shorts, MD     TAKE these medications   Alcohol Prep Pads Pt uses an alcohol pad each time sugars are tested. Pt tests twice daily. Dx E11.9   amLODipine 10 MG tablet Commonly known as: NORVASC TAKE 1 TABLET BY MOUTH EVERY DAY   Anoro Ellipta 62.5-25 MCG/INH Aepb Generic drug: umeclidinium-vilanterol Inhale 1 puff into the lungs daily.   atorvastatin 40 MG tablet Commonly known as: LIPITOR TAKE 1 TABLET BY MOUTH EVERY DAY What changed: when to take this   carvedilol 3.125 MG tablet Commonly known as: COREG Take 1 tablet (3.125 mg total) by mouth 2 (two) times daily with a meal.   ciclopirox 8 % solution Commonly known as: PENLAC Apply topically at bedtime. Apply over nail and surrounding skin. Apply daily over  previous coat. After seven (7) days, may remove with alcohol and continue cycle.   fish oil-omega-3 fatty acids 1000 MG capsule Take 1 g by mouth daily.   fluticasone 50 MCG/ACT nasal spray Commonly known as: FLONASE SPRAY 2 SPRAYS INTO EACH NOSTRIL EVERY DAY What changed: See the new instructions.   furosemide 20 MG tablet Commonly known as: Lasix Take 1 tablet (20 mg total) by mouth daily.   glipiZIDE 5 MG tablet Commonly known as: GLUCOTROL Take 1 tablet (5 mg total) by mouth daily before breakfast AND 0.5 tablets (2.5 mg total) daily before supper. Replaces: glipiZIDE 10 MG 24 hr tablet Started by: Scarlette Shorts, MD   glucose blood test strip Commonly known as: FREESTYLE LITE USE TO TEST BLOOD GLUCOSE 2 TIMES DAILY. Dx E11.9   loratadine 10 MG tablet Commonly known as: CLARITIN TAKE 1 TABLET BY MOUTH EVERY DAY   Melatonin 3 MG Tabs Take 3 mg by mouth at bedtime.   metFORMIN 500 MG 24 hr tablet Commonly known as: GLUCOPHAGE-XR TAKE 2 TABLETS BY MOUTH TWICE DAILY BEFORE MEAL   OVER THE COUNTER MEDICATION Take 1 tablet by mouth daily. Equate Vision Formula 50+   sertraline 50 MG tablet Commonly known as: ZOLOFT TAKE 1 TABLET BY MOUTH EVERY DAY   TRUEplus Lancets 30G Misc Use one lancet each time sugars are tested. Pt tests twice daily. Dx. E11.9   Vision Formula Tabs Take 1 tablet by mouth daily.   VITAMIN D PO Take 2,000 Units by mouth daily.        ALLERGIES: No Known Allergies   REVIEW OF SYSTEMS: A comprehensive ROS was conducted with the patient and is negative except as per HPI and below:  Review of Systems  Constitutional: Positive for weight loss. Negative for fever.  HENT: Negative for congestion and sore throat.   Eyes: Negative for blurred vision and pain.  Respiratory: Negative for cough and shortness of breath.   Cardiovascular: Positive for leg swelling. Negative for chest pain.  Gastrointestinal: Negative for constipation and  nausea.  Genitourinary: Positive for frequency.  Neurological: Negative for tingling and tremors.  Endo/Heme/Allergies: Negative for polydipsia.      OBJECTIVE:   VITAL SIGNS: BP 138/78 (BP Location: Left Arm, Patient Position: Sitting, Cuff Size: Normal)   Pulse 67   Temp 98.7 F (37.1 C)   Ht 5' (1.524 m)   Wt 207 lb 3.2 oz (94 kg)   LMP 06/25/2008   SpO2 98%   BMI 40.47 kg/m    PHYSICAL EXAM:  General: Pt appears well and is in NAD  Hydration: Well-hydrated with moist mucous membranes and good skin turgor  HEENT:  Head: Unremarkable. Oropharynx clear without exudate.  Eyes: External eye exam normal without stare, lid lag or exophthalmos.  EOM intact.   Neck: General: Supple without adenopathy or carotid bruits. Thyroid: Thyroid size normal.  No goiter or nodules appreciated. No thyroid bruit.  Lungs: Clear with good BS bilat with no rales, rhonchi, or wheezes  Heart: RRR with normal S1 and S2 and no gallops; no murmurs; no rub  Abdomen: Normoactive bowel sounds, soft, nontender, without masses or organomegaly palpable  Extremities:  Lower extremities - Trace pretibial edema.   Skin: Normal texture and temperature to palpation. No rash noted.  Neuro: MS is good with appropriate affect, pt is alert and Ox3    DM foot exam: 04/03/2019  The skin of the feet is without sores or ulcerations. The pedal pulses are 2+ on right and 2+ on left. The sensation is intact to a screening 5.07, 10 gram monofilament bilaterally   DATA REVIEWED:  Lab Results  Component Value Date   HGBA1C 8.0 (H) 03/06/2019   HGBA1C 7.8 (H) 03/04/2019   HGBA1C 8.1 (H) 02/06/2019   Lab Results  Component Value Date   LDLCALC 59 02/06/2019   CREATININE 1.19 04/03/2019     Lab Results  Component Value Date   CHOL 132 02/06/2019   HDL 52.50 02/06/2019   LDLCALC 59 02/06/2019   LDLDIRECT 55.0 10/21/2017   TRIG 104.0 02/06/2019   CHOLHDL 3 02/06/2019       Results for Kayla Brown, Kayla Brown (MRN  [id number]) as of 04/06/2019 08:17  Ref. Range 04/03/2019 15:16  Sodium Latest Ref Range: 135 - 145 mEq/L 142  Potassium Latest Ref Range: 3.5 - 5.1 mEq/L 4.4  Chloride Latest Ref Range: 96 - 112 mEq/L 108  CO2 Latest Ref Range: 19 - 32 mEq/L 24  Glucose Latest Ref Range: 70 - 99 mg/dL 122 (H)  BUN Latest Ref Range: 6 - 23 mg/dL 35 (H)  Creatinine Latest Ref Range: 0.40 - 1.20 mg/dL 1.19  Calcium Latest Ref Range: 8.4 - 10.5 mg/dL 10.2  GFR Latest Ref Range: >60.00 mL/min 45.24 (L)   ASSESSMENT / PLAN / RECOMMENDATIONS:   1) Type 2 Diabetes Mellitus, Sub-Optimal controlled, With CKD III complications - Most recent A1c of 8.0 %. Goal A1c < 7-7.5 %.    Plan: GENERAL: I have discussed with the patient the pathophysiology of diabetes. We went over the natural progression of the disease. We talked about both insulin resistance and insulin deficiency. We stressed the importance of lifestyle changes including diet and exercise. I explained the complications associated with diabetes including retinopathy, nephropathy, neuropathy as well as increased risk of cardiovascular disease. We went over the benefit seen with glycemic control.   I explained to the patient that diabetic patients are at higher than normal risk for amputations.  Pt has been having hypoglycemia, will stop Glipizide XL, will switch it to regular Glipizide to reduce risk of hypoglycemia. Will repeat BMP today and as long has her GFR improved, will continue current dose of metformin , otherwise will have to reduce by 50%. If GFR is < 35, will have to stop the metformin.   I have praised her on the great lifestyle changes she has done thus far. She is motivated, but I have advised her to avoid sugar-sweetened beverages and avoid snacks when possible.   MEDICATIONS: - STOP Glipizide XL  - Start (Regular) Glipizide 1 tablet Before breakfast and Half a tablet Before Supper  - Continue Metformin 2 tablets with  Breakfast and 2 tablets  before Supper   EDUCATION / INSTRUCTIONS:  BG monitoring instructions: Patient is instructed to check her blood sugars 2 times a day, fasting and supper.  Call [facility] if: BG persistently < 70 or > 300. . I reviewed the Rule of 15 for the treatment of hypoglycemia in detail with the patient. Literature supplied.   2) Diabetic complications:   Eye: Does not have known diabetic retinopathy.   Neuro/ Feet: Does not have known diabetic peripheral neuropathy.  Renal: Patient does have known baseline CKD. She is not on an ACEI/ARB at present, seems this was recently stopped during hospitalizations.   3) Lipids: Patient is  on atorvastatin 40 mg daily. LDL at goal.    4) Hypertension: She is at goal of < 140/90 mmHg.     F/U in 8 weeks   Signed electronically by: Lyndle Herrlich, MD  [facility]  [facility] [address] [place] [place], [place] [address] Phone: [phone] FAX: [phone]   CC: Sheliah Hatch, MD [address] [place] [address] Phone: [phone]  Fax: [phone]    Return to Endocrinology clinic as below: Future Appointments  Date Time Provider Department Center  04/15/2019  2:00 PM [facility] 6 MINUTE WALK [facility] None  04/21/2019  2:00 PM [facility]-SCREENING [facility] None  04/24/2019  2:00 PM [facility]PFT RM [facility] None  04/24/2019  3:00 PM Parrett, Virgel Bouquet, NP [facility] None  04/27/2019 11:00 AM Alejandro Mulling, RN [facility] None  05/25/2019  1:30 PM Sheliah Hatch, MD [facility] PEC  05/29/2019  2:00 PM , Konrad Dolores, MD [facility] None  06/15/2019  3:30 PM Waymon Budge, MD [facility] None

## 2019-04-06 ENCOUNTER — Telehealth: Payer: Self-pay | Admitting: Family Medicine

## 2019-04-06 DIAGNOSIS — E7800 Pure hypercholesterolemia, unspecified: Secondary | ICD-10-CM | POA: Diagnosis not present

## 2019-04-06 DIAGNOSIS — Z7984 Long term (current) use of oral hypoglycemic drugs: Secondary | ICD-10-CM

## 2019-04-06 DIAGNOSIS — J449 Chronic obstructive pulmonary disease, unspecified: Secondary | ICD-10-CM | POA: Diagnosis not present

## 2019-04-06 DIAGNOSIS — I272 Pulmonary hypertension, unspecified: Secondary | ICD-10-CM | POA: Diagnosis not present

## 2019-04-06 DIAGNOSIS — F329 Major depressive disorder, single episode, unspecified: Secondary | ICD-10-CM | POA: Diagnosis not present

## 2019-04-06 DIAGNOSIS — I5033 Acute on chronic diastolic (congestive) heart failure: Secondary | ICD-10-CM | POA: Diagnosis not present

## 2019-04-06 DIAGNOSIS — J9601 Acute respiratory failure with hypoxia: Secondary | ICD-10-CM | POA: Diagnosis not present

## 2019-04-06 DIAGNOSIS — N183 Chronic kidney disease, stage 3 unspecified: Secondary | ICD-10-CM | POA: Diagnosis not present

## 2019-04-06 DIAGNOSIS — Z9989 Dependence on other enabling machines and devices: Secondary | ICD-10-CM

## 2019-04-06 DIAGNOSIS — Z6839 Body mass index (BMI) 39.0-39.9, adult: Secondary | ICD-10-CM | POA: Diagnosis not present

## 2019-04-06 DIAGNOSIS — E1122 Type 2 diabetes mellitus with diabetic chronic kidney disease: Secondary | ICD-10-CM | POA: Diagnosis not present

## 2019-04-06 DIAGNOSIS — G4733 Obstructive sleep apnea (adult) (pediatric): Secondary | ICD-10-CM | POA: Diagnosis not present

## 2019-04-06 DIAGNOSIS — I130 Hypertensive heart and chronic kidney disease with heart failure and stage 1 through stage 4 chronic kidney disease, or unspecified chronic kidney disease: Secondary | ICD-10-CM | POA: Diagnosis not present

## 2019-04-06 MED ORDER — CARVEDILOL 3.125 MG PO TABS: 3.1250 mg | ORAL_TABLET | Freq: Two times a day (BID) | ORAL | 1 refills | Status: DC

## 2019-04-06 MED ORDER — FUROSEMIDE 20 MG PO TABS: 20.0000 mg | ORAL_TABLET | Freq: Every day | ORAL | 0 refills | Status: DC

## 2019-04-06 NOTE — Telephone Encounter (Signed)
Form completed and placed in basket  

## 2019-04-06 NOTE — Telephone Encounter (Signed)
Ok for refills

## 2019-04-06 NOTE — Telephone Encounter (Addendum)
° ° °  Patient has not found another Cardiologist. Declined to schedule with Dr Radford Pax. Patient request to change providers, request NL provider

## 2019-04-06 NOTE — Telephone Encounter (Signed)
I can see. -W

## 2019-04-06 NOTE — Telephone Encounter (Signed)
FYI

## 2019-04-06 NOTE — Telephone Encounter (Signed)
Medication filled to pharmacy as requested.   

## 2019-04-06 NOTE — Telephone Encounter (Signed)
Patient called saying that she was told by Dr. Birdie Riddle to call the office when she ran out of the medication that was given to her in when she was in the hospital. The medication was Furosemide (lasix) 20mg  and Carvedilol 3.125mg . Pharmacy is the [facility] on [address]. Please advise.

## 2019-04-06 NOTE — Telephone Encounter (Signed)
Patient says that she is out of the medication in the last encounter and needs them to to be called in. She was told to call the office for the refills.

## 2019-04-06 NOTE — Telephone Encounter (Signed)
She will need to find another sleep MD

## 2019-04-06 NOTE — Telephone Encounter (Signed)
Picked up forms and have faxed to the # listed and sent to scan

## 2019-04-07 DIAGNOSIS — J9601 Acute respiratory failure with hypoxia: Secondary | ICD-10-CM | POA: Diagnosis not present

## 2019-04-07 DIAGNOSIS — J449 Chronic obstructive pulmonary disease, unspecified: Secondary | ICD-10-CM | POA: Diagnosis not present

## 2019-04-07 DIAGNOSIS — I130 Hypertensive heart and chronic kidney disease with heart failure and stage 1 through stage 4 chronic kidney disease, or unspecified chronic kidney disease: Secondary | ICD-10-CM | POA: Diagnosis not present

## 2019-04-07 DIAGNOSIS — I5033 Acute on chronic diastolic (congestive) heart failure: Secondary | ICD-10-CM | POA: Diagnosis not present

## 2019-04-07 DIAGNOSIS — E1122 Type 2 diabetes mellitus with diabetic chronic kidney disease: Secondary | ICD-10-CM | POA: Diagnosis not present

## 2019-04-07 DIAGNOSIS — N183 Chronic kidney disease, stage 3 (moderate): Secondary | ICD-10-CM | POA: Diagnosis not present

## 2019-04-08 DIAGNOSIS — I5033 Acute on chronic diastolic (congestive) heart failure: Secondary | ICD-10-CM | POA: Diagnosis not present

## 2019-04-08 DIAGNOSIS — J449 Chronic obstructive pulmonary disease, unspecified: Secondary | ICD-10-CM | POA: Diagnosis not present

## 2019-04-08 DIAGNOSIS — E1122 Type 2 diabetes mellitus with diabetic chronic kidney disease: Secondary | ICD-10-CM | POA: Diagnosis not present

## 2019-04-08 DIAGNOSIS — N183 Chronic kidney disease, stage 3 (moderate): Secondary | ICD-10-CM | POA: Diagnosis not present

## 2019-04-08 DIAGNOSIS — J9601 Acute respiratory failure with hypoxia: Secondary | ICD-10-CM | POA: Diagnosis not present

## 2019-04-08 DIAGNOSIS — I130 Hypertensive heart and chronic kidney disease with heart failure and stage 1 through stage 4 chronic kidney disease, or unspecified chronic kidney disease: Secondary | ICD-10-CM | POA: Diagnosis not present

## 2019-04-09 DIAGNOSIS — I5033 Acute on chronic diastolic (congestive) heart failure: Secondary | ICD-10-CM | POA: Diagnosis not present

## 2019-04-09 DIAGNOSIS — N183 Chronic kidney disease, stage 3 (moderate): Secondary | ICD-10-CM | POA: Diagnosis not present

## 2019-04-09 DIAGNOSIS — J9601 Acute respiratory failure with hypoxia: Secondary | ICD-10-CM | POA: Diagnosis not present

## 2019-04-09 DIAGNOSIS — E1122 Type 2 diabetes mellitus with diabetic chronic kidney disease: Secondary | ICD-10-CM | POA: Diagnosis not present

## 2019-04-09 DIAGNOSIS — J449 Chronic obstructive pulmonary disease, unspecified: Secondary | ICD-10-CM | POA: Diagnosis not present

## 2019-04-09 DIAGNOSIS — I130 Hypertensive heart and chronic kidney disease with heart failure and stage 1 through stage 4 chronic kidney disease, or unspecified chronic kidney disease: Secondary | ICD-10-CM | POA: Diagnosis not present

## 2019-04-10 DIAGNOSIS — E1122 Type 2 diabetes mellitus with diabetic chronic kidney disease: Secondary | ICD-10-CM | POA: Diagnosis not present

## 2019-04-10 DIAGNOSIS — J449 Chronic obstructive pulmonary disease, unspecified: Secondary | ICD-10-CM | POA: Diagnosis not present

## 2019-04-10 DIAGNOSIS — I5033 Acute on chronic diastolic (congestive) heart failure: Secondary | ICD-10-CM | POA: Diagnosis not present

## 2019-04-10 DIAGNOSIS — N183 Chronic kidney disease, stage 3 (moderate): Secondary | ICD-10-CM | POA: Diagnosis not present

## 2019-04-10 DIAGNOSIS — J9601 Acute respiratory failure with hypoxia: Secondary | ICD-10-CM | POA: Diagnosis not present

## 2019-04-10 DIAGNOSIS — I130 Hypertensive heart and chronic kidney disease with heart failure and stage 1 through stage 4 chronic kidney disease, or unspecified chronic kidney disease: Secondary | ICD-10-CM | POA: Diagnosis not present

## 2019-04-14 DIAGNOSIS — I130 Hypertensive heart and chronic kidney disease with heart failure and stage 1 through stage 4 chronic kidney disease, or unspecified chronic kidney disease: Secondary | ICD-10-CM | POA: Diagnosis not present

## 2019-04-14 DIAGNOSIS — E1122 Type 2 diabetes mellitus with diabetic chronic kidney disease: Secondary | ICD-10-CM | POA: Diagnosis not present

## 2019-04-14 DIAGNOSIS — J9601 Acute respiratory failure with hypoxia: Secondary | ICD-10-CM | POA: Diagnosis not present

## 2019-04-14 DIAGNOSIS — J449 Chronic obstructive pulmonary disease, unspecified: Secondary | ICD-10-CM | POA: Diagnosis not present

## 2019-04-14 DIAGNOSIS — I5033 Acute on chronic diastolic (congestive) heart failure: Secondary | ICD-10-CM | POA: Diagnosis not present

## 2019-04-14 DIAGNOSIS — N183 Chronic kidney disease, stage 3 (moderate): Secondary | ICD-10-CM | POA: Diagnosis not present

## 2019-04-15 ENCOUNTER — Other Ambulatory Visit: Payer: Self-pay

## 2019-04-15 ENCOUNTER — Ambulatory Visit (INDEPENDENT_AMBULATORY_CARE_PROVIDER_SITE_OTHER): Payer: Medicare Other

## 2019-04-15 DIAGNOSIS — I272 Pulmonary hypertension, unspecified: Secondary | ICD-10-CM

## 2019-04-15 DIAGNOSIS — R0609 Other forms of dyspnea: Secondary | ICD-10-CM

## 2019-04-15 DIAGNOSIS — R0600 Dyspnea, unspecified: Secondary | ICD-10-CM

## 2019-04-15 NOTE — Progress Notes (Signed)
SIX MIN WALK 04/15/2019  Medications amlodipine 10mg , carvedilol 3.125mg , Claritin 10mg , Glipizide 5mg  and metformin 500mg . All were taken around 745am today.  Supplimental Oxygen during Test? (L/min) No  Laps 7  Partial Lap (in Meters) 29  Baseline BP (sitting) 118/72  Baseline Heartrate 72  Baseline Dyspnea (Borg Scale) 0  Baseline Fatigue (Borg Scale) 0  Baseline SPO2 100  BP (sitting) 132/84  Heartrate 75  Dyspnea (Borg Scale) 1  Fatigue (Borg Scale) 1  SPO2 95  BP (sitting) 130/84  Heartrate 74  SPO2 97  Stopped or Paused before Six Minutes No  Distance Completed 267  Tech Comments: Patient was able to complete 28mw without stopping. She walked at a slow pace. Patient was a little unsteady but she stated that this is normal after her stroke a few months ago. She is still working with PT at home. Denied any SOB, chest or leg pain after walk. No O2 was needed during or after walk.

## 2019-04-16 DIAGNOSIS — I5033 Acute on chronic diastolic (congestive) heart failure: Secondary | ICD-10-CM | POA: Diagnosis not present

## 2019-04-16 DIAGNOSIS — I130 Hypertensive heart and chronic kidney disease with heart failure and stage 1 through stage 4 chronic kidney disease, or unspecified chronic kidney disease: Secondary | ICD-10-CM | POA: Diagnosis not present

## 2019-04-16 DIAGNOSIS — N183 Chronic kidney disease, stage 3 (moderate): Secondary | ICD-10-CM | POA: Diagnosis not present

## 2019-04-16 DIAGNOSIS — J9601 Acute respiratory failure with hypoxia: Secondary | ICD-10-CM | POA: Diagnosis not present

## 2019-04-16 DIAGNOSIS — J449 Chronic obstructive pulmonary disease, unspecified: Secondary | ICD-10-CM | POA: Diagnosis not present

## 2019-04-16 DIAGNOSIS — E1122 Type 2 diabetes mellitus with diabetic chronic kidney disease: Secondary | ICD-10-CM | POA: Diagnosis not present

## 2019-04-17 DIAGNOSIS — I5033 Acute on chronic diastolic (congestive) heart failure: Secondary | ICD-10-CM | POA: Diagnosis not present

## 2019-04-17 DIAGNOSIS — J9601 Acute respiratory failure with hypoxia: Secondary | ICD-10-CM | POA: Diagnosis not present

## 2019-04-17 DIAGNOSIS — I130 Hypertensive heart and chronic kidney disease with heart failure and stage 1 through stage 4 chronic kidney disease, or unspecified chronic kidney disease: Secondary | ICD-10-CM | POA: Diagnosis not present

## 2019-04-17 DIAGNOSIS — N183 Chronic kidney disease, stage 3 (moderate): Secondary | ICD-10-CM | POA: Diagnosis not present

## 2019-04-17 DIAGNOSIS — J449 Chronic obstructive pulmonary disease, unspecified: Secondary | ICD-10-CM | POA: Diagnosis not present

## 2019-04-17 DIAGNOSIS — E1122 Type 2 diabetes mellitus with diabetic chronic kidney disease: Secondary | ICD-10-CM | POA: Diagnosis not present

## 2019-04-19 DIAGNOSIS — Z7984 Long term (current) use of oral hypoglycemic drugs: Secondary | ICD-10-CM | POA: Diagnosis not present

## 2019-04-19 DIAGNOSIS — N183 Chronic kidney disease, stage 3 unspecified: Secondary | ICD-10-CM | POA: Diagnosis not present

## 2019-04-19 DIAGNOSIS — Z9989 Dependence on other enabling machines and devices: Secondary | ICD-10-CM | POA: Diagnosis not present

## 2019-04-19 DIAGNOSIS — J449 Chronic obstructive pulmonary disease, unspecified: Secondary | ICD-10-CM | POA: Diagnosis not present

## 2019-04-19 DIAGNOSIS — I272 Pulmonary hypertension, unspecified: Secondary | ICD-10-CM | POA: Diagnosis not present

## 2019-04-19 DIAGNOSIS — F329 Major depressive disorder, single episode, unspecified: Secondary | ICD-10-CM | POA: Diagnosis not present

## 2019-04-19 DIAGNOSIS — E1122 Type 2 diabetes mellitus with diabetic chronic kidney disease: Secondary | ICD-10-CM | POA: Diagnosis not present

## 2019-04-19 DIAGNOSIS — Z6839 Body mass index (BMI) 39.0-39.9, adult: Secondary | ICD-10-CM | POA: Diagnosis not present

## 2019-04-19 DIAGNOSIS — G4733 Obstructive sleep apnea (adult) (pediatric): Secondary | ICD-10-CM | POA: Diagnosis not present

## 2019-04-19 DIAGNOSIS — I5033 Acute on chronic diastolic (congestive) heart failure: Secondary | ICD-10-CM | POA: Diagnosis not present

## 2019-04-19 DIAGNOSIS — I130 Hypertensive heart and chronic kidney disease with heart failure and stage 1 through stage 4 chronic kidney disease, or unspecified chronic kidney disease: Secondary | ICD-10-CM | POA: Diagnosis not present

## 2019-04-19 DIAGNOSIS — E7800 Pure hypercholesterolemia, unspecified: Secondary | ICD-10-CM | POA: Diagnosis not present

## 2019-04-19 DIAGNOSIS — J9601 Acute respiratory failure with hypoxia: Secondary | ICD-10-CM | POA: Diagnosis not present

## 2019-04-20 DIAGNOSIS — N183 Chronic kidney disease, stage 3 unspecified: Secondary | ICD-10-CM | POA: Diagnosis not present

## 2019-04-20 DIAGNOSIS — J449 Chronic obstructive pulmonary disease, unspecified: Secondary | ICD-10-CM | POA: Diagnosis not present

## 2019-04-20 DIAGNOSIS — J9601 Acute respiratory failure with hypoxia: Secondary | ICD-10-CM | POA: Diagnosis not present

## 2019-04-20 DIAGNOSIS — I130 Hypertensive heart and chronic kidney disease with heart failure and stage 1 through stage 4 chronic kidney disease, or unspecified chronic kidney disease: Secondary | ICD-10-CM | POA: Diagnosis not present

## 2019-04-20 DIAGNOSIS — I5033 Acute on chronic diastolic (congestive) heart failure: Secondary | ICD-10-CM | POA: Diagnosis not present

## 2019-04-20 DIAGNOSIS — E1122 Type 2 diabetes mellitus with diabetic chronic kidney disease: Secondary | ICD-10-CM | POA: Diagnosis not present

## 2019-04-21 ENCOUNTER — Other Ambulatory Visit (HOSPITAL_COMMUNITY)
Admission: RE | Admit: 2019-04-21 | Discharge: 2019-04-21 | Disposition: A | Discharge status: Home / Self Care | Payer: Medicare Other | Source: Ambulatory Visit | Attending: Internal Medicine | Admitting: Internal Medicine

## 2019-04-21 DIAGNOSIS — J9601 Acute respiratory failure with hypoxia: Secondary | ICD-10-CM | POA: Diagnosis not present

## 2019-04-21 DIAGNOSIS — Z20828 Contact with and (suspected) exposure to other viral communicable diseases: Secondary | ICD-10-CM | POA: Diagnosis not present

## 2019-04-21 DIAGNOSIS — J449 Chronic obstructive pulmonary disease, unspecified: Secondary | ICD-10-CM | POA: Diagnosis not present

## 2019-04-21 DIAGNOSIS — N183 Chronic kidney disease, stage 3 unspecified: Secondary | ICD-10-CM | POA: Diagnosis not present

## 2019-04-21 DIAGNOSIS — Z01812 Encounter for preprocedural laboratory examination: Secondary | ICD-10-CM | POA: Insufficient documentation

## 2019-04-21 DIAGNOSIS — I5033 Acute on chronic diastolic (congestive) heart failure: Secondary | ICD-10-CM | POA: Diagnosis not present

## 2019-04-21 DIAGNOSIS — E1122 Type 2 diabetes mellitus with diabetic chronic kidney disease: Secondary | ICD-10-CM | POA: Diagnosis not present

## 2019-04-21 DIAGNOSIS — I130 Hypertensive heart and chronic kidney disease with heart failure and stage 1 through stage 4 chronic kidney disease, or unspecified chronic kidney disease: Secondary | ICD-10-CM | POA: Diagnosis not present

## 2019-04-22 LAB — NOVEL CORONAVIRUS, NAA (HOSP ORDER, SEND-OUT TO REF LAB; TAT 18-24 HRS): SARS-CoV-2, NAA: NOT DETECTED

## 2019-04-24 ENCOUNTER — Telehealth: Payer: Self-pay | Admitting: *Deleted

## 2019-04-24 ENCOUNTER — Other Ambulatory Visit: Payer: Self-pay

## 2019-04-24 ENCOUNTER — Encounter: Payer: Self-pay | Admitting: Adult Health

## 2019-04-24 ENCOUNTER — Ambulatory Visit (INDEPENDENT_AMBULATORY_CARE_PROVIDER_SITE_OTHER): Payer: Medicare Other | Admitting: Internal Medicine

## 2019-04-24 ENCOUNTER — Ambulatory Visit (INDEPENDENT_AMBULATORY_CARE_PROVIDER_SITE_OTHER): Payer: Medicare Other | Admitting: Adult Health

## 2019-04-24 VITALS — BP 138/74 | HR 76 | Temp 97.8°F | Ht 60.0 in | Wt 206.0 lb

## 2019-04-24 DIAGNOSIS — J9601 Acute respiratory failure with hypoxia: Secondary | ICD-10-CM | POA: Diagnosis not present

## 2019-04-24 DIAGNOSIS — G4733 Obstructive sleep apnea (adult) (pediatric): Secondary | ICD-10-CM | POA: Diagnosis not present

## 2019-04-24 DIAGNOSIS — N183 Chronic kidney disease, stage 3 unspecified: Secondary | ICD-10-CM | POA: Diagnosis not present

## 2019-04-24 DIAGNOSIS — R0600 Dyspnea, unspecified: Secondary | ICD-10-CM | POA: Diagnosis not present

## 2019-04-24 DIAGNOSIS — I272 Pulmonary hypertension, unspecified: Secondary | ICD-10-CM

## 2019-04-24 DIAGNOSIS — E1122 Type 2 diabetes mellitus with diabetic chronic kidney disease: Secondary | ICD-10-CM | POA: Diagnosis not present

## 2019-04-24 DIAGNOSIS — I5033 Acute on chronic diastolic (congestive) heart failure: Secondary | ICD-10-CM | POA: Diagnosis not present

## 2019-04-24 DIAGNOSIS — I5032 Chronic diastolic (congestive) heart failure: Secondary | ICD-10-CM | POA: Diagnosis not present

## 2019-04-24 DIAGNOSIS — R0609 Other forms of dyspnea: Secondary | ICD-10-CM

## 2019-04-24 DIAGNOSIS — I130 Hypertensive heart and chronic kidney disease with heart failure and stage 1 through stage 4 chronic kidney disease, or unspecified chronic kidney disease: Secondary | ICD-10-CM | POA: Diagnosis not present

## 2019-04-24 DIAGNOSIS — J449 Chronic obstructive pulmonary disease, unspecified: Secondary | ICD-10-CM | POA: Diagnosis not present

## 2019-04-24 LAB — PULMONARY FUNCTION TEST
DL/VA % pred: 85 %
DL/VA: 3.66 ml/min/mmHg/L
DLCO cor % pred: 114 %
DLCO cor: 19.59 ml/min/mmHg
DLCO unc % pred: 109 %
DLCO unc: 18.69 ml/min/mmHg
FEF 25-75 Post: 3.23 L/sec
FEF 25-75 Pre: 1.99 L/sec
FEF2575-%Change-Post: 62 %
FEF2575-%Pred-Post: 178 %
FEF2575-%Pred-Pre: 109 %
FEV1-%Change-Post: 11 %
FEV1-%Pred-Post: 102 %
FEV1-%Pred-Pre: 91 %
FEV1-Post: 2.04 L
FEV1-Pre: 1.83 L
FEV1FVC-%Change-Post: 5 %
FEV1FVC-%Pred-Pre: 106 %
FEV6-%Change-Post: 6 %
FEV6-%Pred-Post: 94 %
FEV6-%Pred-Pre: 88 %
FEV6-Post: 2.37 L
FEV6-Pre: 2.23 L
FEV6FVC-%Pred-Post: 104 %
FEV6FVC-%Pred-Pre: 104 %
FVC-%Change-Post: 6 %
FVC-%Pred-Post: 90 %
FVC-%Pred-Pre: 85 %
FVC-Post: 2.37 L
FVC-Pre: 2.23 L
Post FEV1/FVC ratio: 86 %
Post FEV6/FVC ratio: 100 %
Pre FEV1/FVC ratio: 82 %
Pre FEV6/FVC Ratio: 100 %
RV % pred: 120 %
RV: 2.32 L
TLC % pred: 113 %
TLC: 5.07 L

## 2019-04-24 MED ORDER — ALBUTEROL SULFATE HFA 108 (90 BASE) MCG/ACT IN AERS: 1.0000 | INHALATION_SPRAY | Freq: Four times a day (QID) | RESPIRATORY_TRACT | Status: DC | PRN

## 2019-04-24 NOTE — Patient Instructions (Addendum)
May remain off ANORO  Use Albuterol Inhaler 2 puffs every 4hrs as needed for wheezing /shortness of breath.  -this is your rescue inhaler  Weigh daily , keep log  Low salt diet .  Continue on Lasix 20mg  daily  Continue on CPAP At bedtime   CPAP download .  Work on healthy weight loss .  Follow up with Cardiology as planned  Follow up with Dr. Annamaria Boots  In 2  months and As needed   Please contact office for sooner follow up if symptoms do not improve or worsen or seek emergency care

## 2019-04-24 NOTE — Assessment & Plan Note (Signed)
Healthy weight loss 

## 2019-04-24 NOTE — Assessment & Plan Note (Signed)
Dyspnea suspect is multifactorial with underlying diastolic dysfunction and deconditioning. Patient seems to be slightly improved since last visit.  No evidence of airflow obstruction or restriction on PFTs.  Could have some mild asthma/reactive airways as she has significant mid flow reversibility however mid flows are completely normal with no obstruction noted on PFTs.  Recent 2D echo did not show any significant pulmonary hypertension as RV systolic function was normal and RV size was normal.  Autoimmune work-up was negative and VQ scan was negative. For now we will advise patient activity as tolerated and continue to work with physical therapy as able.  May use albuterol as needed.  And will continue to monitor.  Plan  Patient Instructions  May remain off [facility]  Use Albuterol Inhaler 2 puffs every 4hrs as needed for wheezing /shortness of breath.  -this is your rescue inhaler  Weigh daily , keep log  Low salt diet .  Continue on Lasix 20mg  daily  Continue on CPAP At bedtime   CPAP download .  Work on healthy weight loss .  Follow up with Cardiology as planned  Follow up with Dr. Annamaria Boots  In 2  months and As needed   Please contact office for sooner follow up if symptoms do not improve or worsen or seek emergency care

## 2019-04-24 NOTE — Assessment & Plan Note (Signed)
Appears compensated without evidence of volume overload on exam.  Continue follow-up with cardiology. 

## 2019-04-24 NOTE — Assessment & Plan Note (Signed)
Severe obstructive sleep apnea.  Continue on CPAP at nighttime.  CPAP download was requested

## 2019-04-24 NOTE — Progress Notes (Signed)
Full PFT performed today. °

## 2019-04-24 NOTE — Progress Notes (Signed)
@Patient  ID: Kayla Brown, female    DOB: [birth year], 68 y.o.   MRN: [id number]  Chief Complaint  Patient presents with  . Follow-up    Referring provider: [name], MD  HPI: 68 year old female never smoker (secondhand smoke exposure/woodstove exposure) seen for pulmonary consult February 16, 2019 for progressive dyspnea Medical history significant for severe sleep apnea on nocturnal CPAP,  hypertension and diabetes Patient is adopted.  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  CT chest March 05, 2019 extensive atelectatic changes throughout the lungs with a dependent predominance, mosaic attenuation throughout the lungs, emphysema  VQ scan 03/04/2019 no unmatched segmental perfusion defects to suggest acute PE  Autoimmune work-up negative 2020 , SH /Occupational hx unrevealing   mild with PASP 53mmHg by echo 05/2016  04/24/2019 Follow up : Dyspnea  Patient returns for a 1 month follow-up.  She has been seen for a pulmonary consult for progressive dyspnea.  Patient was admitted September 2020 for decompensated heart failure.  She did improve with aggressive diuresis with weight loss of 14 pounds.  Autoimmune panel was negative.  CT chest showed atelectatic changes and a mosaic attenuation.  She is a never smoker.  However was born prematurely.  VQ scan showed no unmatched segmental perfusion defects to suggest acute PE.  2D echo showed normal-sized RV and normal  systolic RV function.  EF was preserved.  Patient was given a sample of Anoro which she says she did not have any significant change in her breathing. Patient was set up for a 6-minute walk test that showed 267 m completed.  With no desaturations. PFTs show no airflow obstruction or restriction.  FEV1 102%, ratio 86, FVC 90% normal mid flows,, DLCO 90%. She says since last visit she does feel that she is feeling better.  Her dyspnea is less.  She is currently working with physical therapy and feels that it is really helped her a lot.  She is able to walk around a lot more.  She is also working on some weight loss.  And eating healthier. She says she has an albuterol inhaler at home but has not had to use it.  Patient does have underlying sleep apnea on nocturnal CPAP.  Says she wears it every single night.  Never misses a night.  She does want our group to start managing her sleep apnea.  A CPAP download was requested.    No Known Allergies  Immunization History  Administered Date(s) Administered  . Fluad Quad(high Dose 65+) 02/16/2019  . Influenza,inj,Quad PF,6+ Mos 04/24/2016, 03/04/2017, 03/20/2018  . Pneumococcal Conjugate-13 07/06/2016  . Pneumococcal Polysaccharide-23 07/01/2017  . Tdap 09/23/2009    Past Medical History:  Diagnosis Date  . Anxiety   . Diabetes mellitus   . Edema of extremities 03/02/2016  . Essential hypertension, benign   . Lazy eye   . Major depressive disorder, single episode, unspecified   . Obesity (BMI 30-39.9) 03/01/2016  . OSA (obstructive sleep apnea)   . Pulmonary HTN ([place])  mild with PASP by echo 05/2016  . Pure hypercholesterolemia   . Retinopathy, due to hypertension     Tobacco History: Social History   Tobacco Use  Smoking Status Never Smoker  Smokeless Tobacco Never Used   Counseling given: Not Answered   Outpatient Medications Prior to Visit  Medication Sig Dispense Refill  . Alcohol Swabs (ALCOHOL PREP) PADS Pt uses an  alcohol pad each time sugars are tested. Pt tests twice daily. Dx E11.9 100 each 3  . amLODipine (NORVASC) 10 MG tablet TAKE 1 TABLET BY MOUTH EVERY DAY (Patient taking differently: Take 10 mg by mouth daily. ) 90 tablet 1  . atorvastatin (LIPITOR) 40 MG tablet TAKE 1 TABLET BY MOUTH EVERY DAY (Patient taking differently: Take 40 mg by mouth daily at 6 PM. ) 90 tablet 1  . carvedilol (COREG) 3.125 MG tablet Take 1 tablet (3.125 mg total) by mouth 2 (two) times daily with a meal. 180 tablet 1  . Cholecalciferol (VITAMIN D PO) Take 2,000 Units by mouth daily.     . ciclopirox (PENLAC) 8 % solution Apply topically at bedtime. Apply over nail and surrounding skin. Apply daily over previous coat. After seven (7) days, may remove with alcohol and continue cycle. 6.6 mL 0  . fish oil-omega-3 fatty acids 1000 MG capsule Take 1 g by mouth daily.    . fluticasone (FLONASE) 50 MCG/ACT nasal spray SPRAY 2 SPRAYS INTO EACH NOSTRIL EVERY DAY (Patient taking differently: Place 2 sprays into both nostrils daily. ) 18 g 1  . furosemide (LASIX) 20 MG tablet Take 1 tablet (20 mg total) by mouth daily. 90 tablet 0  . glipiZIDE (GLUCOTROL) 5 MG tablet Take 1 tablet (5 mg total) by mouth daily before breakfast AND 0.5 tablets (2.5 mg total) daily before supper. 45 tablet 3  . glucose blood (FREESTYLE LITE) test strip USE TO TEST BLOOD GLUCOSE 2 TIMES DAILY. Dx E11.9 100 each 12  . loratadine (CLARITIN) 10 MG tablet TAKE 1 TABLET BY MOUTH EVERY DAY (Patient taking differently: Take 10 mg by mouth daily. ) 30 tablet 11  . Melatonin 3 MG TABS Take 3 mg by mouth at bedtime.    . metFORMIN (GLUCOPHAGE-XR) 500 MG 24 hr tablet TAKE 2 TABLETS BY MOUTH TWICE DAILY BEFORE MEAL 360 tablet 0  . Multiple Vitamins-Minerals (VISION FORMULA) TABS Take 1 tablet by mouth daily.     [name] OVER THE COUNTER MEDICATION Take 1 tablet by mouth daily. Equate Vision Formula 50+     . sertraline (ZOLOFT) 50 MG tablet TAKE 1 TABLET BY MOUTH EVERY DAY  (Patient taking differently: Take 50 mg by mouth daily. ) 90 tablet 2  . TRUEPLUS LANCETS 30G MISC Use one lancet each time sugars are tested. Pt tests twice daily. Dx. E11.9 100 each 3  . umeclidinium-vilanterol (ANORO ELLIPTA) 62.5-25 MCG/INH AEPB Inhale 1 puff into the lungs daily. (Patient not taking: Reported on 04/03/2019) 1 each 0   No facility-administered medications prior to visit.      Review of Systems:   Constitutional:   No  weight loss, night sweats,  Fevers, chills,  +fatigue, or  lassitude.  HEENT:   No headaches,  Difficulty swallowing,  Tooth/dental problems, or  Sore throat,                No sneezing, itching, ear ache, nasal congestion, post nasal drip,   CV:  No chest pain,  Orthopnea, PND, swelling in lower extremities, anasarca, dizziness,  palpitations, syncope.   GI  No heartburn, indigestion, abdominal pain, nausea, vomiting, diarrhea, change in bowel habits, loss of appetite, bloody stools.   Resp:  .  No chest wall deformity  Skin: no rash or lesions.  GU: no dysuria, change in color of urine, no urgency or frequency.  No flank pain, no hematuria   MS:  No joint pain or swelling.  No decreased range of motion.  No back pain.    Physical Exam  BP 138/74 (BP Location: Left Arm, Cuff Size: Large)   Pulse 76   Temp 97.8 F (36.6 C) (Temporal)   Ht 5' (1.524 m)   Wt 206 lb (93.4 kg)   LMP 06/25/2008   SpO2 96%   BMI 40.23 kg/m   GEN: A/Ox3; pleasant , NAD, obese current BMI   HEENT:  Center/AT,  , NOSE-clear, THROAT-clear, no lesions, no postnasal drip or exudate noted.  Class II-III MP airway  NECK:  Supple w/ fair ROM; no JVD; normal carotid impulses w/o bruits; no thyromegaly or nodules palpated; no lymphadenopathy.    RESP  Clear  P & A; w/o, wheezes/ rales/ or rhonchi. no accessory muscle use, no dullness to percussion  CARD:  RRR, no m/r/g, no peripheral edema, pulses intact, no cyanosis or clubbing.  GI:   Soft & nt; nml bowel sounds; no  organomegaly or masses detected.   Musco: Warm bil, no deformities or joint swelling noted.   Neuro: alert, no focal deficits noted.    Skin: Warm, no lesions or rashes    Lab Results:   BMET  BNP    Component Value Date/Time   BNP 50.8 03/03/2019 2357   BNP 25.5 04/08/2015 0753    ProBNP No results found for: PROBNP  Imaging: No results found.    PFT Results Latest Ref Rng & Units 04/24/2019 04/04/2015  FVC-Pre L 2.23 2.63  FVC-Predicted Pre % 85 95  FVC-Post L 2.37 2.42  FVC-Predicted Post % 90 87  Pre FEV1/FVC % % 82 79  Post FEV1/FCV % % 86 88  FEV1-Pre L 1.83 2.08  FEV1-Predicted Pre % 91 98  FEV1-Post L 2.04 2.14  DLCO UNC% % 109 68  DLCO COR %Predicted % 85 82  TLC L 5.07 4.31  TLC % Predicted % 113 96  RV % Predicted % 120 90    No results found for: NITRICOXIDE      Assessment & Plan:   Diastolic CHF (HCC) Appears compensated without evidence of volume overload on exam Continue follow-up with cardiology  Dyspnea Dyspnea suspect is multifactorial with underlying diastolic dysfunction and deconditioning. Patient seems to be slightly improved since last visit.  No evidence of airflow obstruction or restriction on PFTs.  Could have some mild asthma/reactive airways as she has significant mid flow reversibility however mid flows are completely normal with no obstruction noted on PFTs.  Recent 2D echo did not show any significant pulmonary hypertension as RV systolic function was normal and RV size was normal.  Autoimmune work-up was negative and VQ scan was negative. For now we will advise patient activity as tolerated and continue to work with physical therapy as able.  May use albuterol as needed.  And will continue to monitor.  Plan  Patient Instructions  May remain off [facility]  Use Albuterol Inhaler 2 puffs every 4hrs as needed for wheezing /shortness of breath.  -this is your rescue inhaler  Weigh daily , keep log  Low salt diet .  Continue  on Lasix 20mg  daily  Continue on CPAP At bedtime   CPAP download .  Work on healthy weight loss .  Follow up with Cardiology as planned  Follow up with Dr.  In 2  months and As needed   Please contact office for sooner follow up if symptoms do not improve or worsen or seek emergency care       Morbid obesity (HCC) Healthy weight loss  OSA (obstructive sleep apnea) Severe obstructive sleep apnea.  Continue on CPAP at nighttime.  CPAP download was requested     Maple Hudson, NP 04/24/2019

## 2019-04-24 NOTE — Telephone Encounter (Signed)
PT called just to let us know that patient will be out of town next week and PT is on hold until she returns.

## 2019-04-27 ENCOUNTER — Other Ambulatory Visit: Payer: Self-pay | Admitting: *Deleted

## 2019-04-27 NOTE — Patient Outreach (Signed)
[facility]) Care Management  04/27/2019  Kayla Brown 1951-02-13 [id number]    Telephone Assessment  RN attempted outreach call to pt today however unsuccessful. RN able to leave a HIPAA approved voice message requesting a call back.  PLAN: Will follow up in once again within one week for ongoing [facility] services.  [name], RN Care Management Coordinator [place] Office [phone]

## 2019-05-01 NOTE — Progress Notes (Signed)
Cardiology Office Note   Date:  05/06/2019   ID:  Kayla Brown Craw, DOB 04/21/1951, MRN [id number]  PCP:  Sheliah Hatchabori, Katherine E, MD  Cardiologist:   Giliana Vantil SwazilandJordan, MD   Chief Complaint  Patient presents with  . Congestive Heart Failure      History of Present Illness: Kayla Brown Tullos is a 68 y.o. female who is seen for follow up dyspnea. She has a history of OSA formerly managed by Dr Mayford Knifeurner. She has a history of mild pulmonary HTN based on Echo in 2016 and 2017 but not replicated on Echo in 2019 and 2020. Myoview in August 2020 was normal. She has OSA,HTN, and dyslipidemia. Shehassevere OSA with an AHI of 36.9/hr andis followed by pulmonary. Prior pulmonary work up includes PFT 04/04/15- mild Diffusion defect, normal flows and volumes without response to bronchodilator. CT chest March 05, 2019 extensive atelectatic changes throughout the lungs with a dependent predominance, mosaic attenuation throughout the lungs, emphysema. VQ scan 03/04/2019 no unmatched segmental perfusion defects to suggest acute PE. Autoimmune work-up negative 2020. She recently had a 6-minute walk test that showed 267 m completed.  With no desaturations. Repeat PFTs show no airflow obstruction or restriction.  FEV1 102%, ratio 86, FVC 90% normal mid flows,, DLCO 90%.  She was admitted 9/8-9/21/20 with acute hypoxic respiratory failure. Patient developed  increased oxygen requirement on 9/10, at 8 L nasal cannula, PCCM consulted, work-up significant for atelectasis and ? pulmonary edema. BNP level was normal.  Patient improved with IV diuresis, and pulmonary PT, but unfortunately developed acute kidney injury due to diuresis. ACEi, hygroton, spironolactone  were discontinued. CT, Echo,  and CXR findings as noted below. She diuresed 20 lbs in the hospital. Seen today for follow up.   On follow up today she is doing much better. Lives [place] and is getting PT. Walking daily. Is compliant with CPAP therapy-  followed by pulmonary. Weight is staying down around 200 lbs. No edema. She is careful with sodium and fluid restriction.    Past Medical History:  Diagnosis Date  . Anxiety   . Diabetes mellitus   . Edema of extremities 03/02/2016  . Essential hypertension, benign   . Lazy eye   . Major depressive disorder, single episode, unspecified   . Obesity (BMI 30-39.9) 03/01/2016  . OSA (obstructive sleep apnea)   . Pulmonary HTN (HCC)    mild with PASP 40mmHg by echo 05/2016  . Pure hypercholesterolemia   . Retinopathy, due to hypertension     Past Surgical History:  Procedure Laterality Date  . CPAP TITRATION  10/14/2015  . DILATION AND CURETTAGE OF UTERUS  2005-2008   x2  . EYE SURGERY     cataracts  . WISDOM TOOTH EXTRACTION       Current Outpatient Medications  Medication Sig Dispense Refill  . albuterol (PROAIR HFA) 108 (90 Base) MCG/ACT inhaler Inhale 1-2 puffs into the lungs every 6 (six) hours as needed for wheezing or shortness of breath. (Patient taking differently: Inhale 1-2 puffs into the lungs every 6 (six) hours as needed for wheezing or shortness of breath. Patient not taking right now due to breathing test. Emergency use only)    . Alcohol Swabs (ALCOHOL PREP) PADS Pt uses an alcohol pad each time sugars are tested. Pt tests twice daily. Dx E11.9 100 each 3  . amLODipine (NORVASC) 10 MG tablet TAKE 1 TABLET BY MOUTH EVERY DAY (Patient taking differently: Take 10 mg by mouth daily. ) 90  tablet 1  . atorvastatin (LIPITOR) 40 MG tablet TAKE 1 TABLET BY MOUTH EVERY DAY (Patient taking differently: Take 40 mg by mouth daily at 6 PM. ) 90 tablet 1  . Cholecalciferol (VITAMIN D PO) Take 2,000 Units by mouth daily.     . ciclopirox (PENLAC) 8 % solution Apply topically at bedtime. Apply over nail and surrounding skin. Apply daily over previous coat. After seven (7) days, may remove with alcohol and continue cycle. 6.6 mL 0  . fish oil-omega-3 fatty acids 1000 MG capsule Take 1 g by  mouth daily.    . fluticasone (FLONASE) 50 MCG/ACT nasal spray SPRAY 2 SPRAYS INTO EACH NOSTRIL EVERY DAY (Patient taking differently: Place 2 sprays into both nostrils daily. ) 18 g 1  . furosemide (LASIX) 20 MG tablet Take 1 tablet (20 mg total) by mouth daily. 90 tablet 0  . glipiZIDE (GLUCOTROL) 5 MG tablet Take 1 tablet (5 mg total) by mouth daily before breakfast AND 0.5 tablets (2.5 mg total) daily before supper. 45 tablet 3  . glucose blood (FREESTYLE LITE) test strip USE TO TEST BLOOD GLUCOSE 2 TIMES DAILY. Dx E11.9 100 each 12  . loratadine (CLARITIN) 10 MG tablet TAKE 1 TABLET BY MOUTH EVERY DAY (Patient taking differently: Take 10 mg by mouth daily. ) 30 tablet 11  . Melatonin 3 MG TABS Take 3 mg by mouth at bedtime.    . metFORMIN (GLUCOPHAGE-XR) 500 MG 24 hr tablet TAKE 2 TABLETS BY MOUTH TWICE DAILY BEFORE MEAL 360 tablet 0  . Multiple Vitamins-Minerals (VISION FORMULA) TABS Take 1 tablet by mouth daily.     [name] OVER THE COUNTER MEDICATION Take 1 tablet by mouth daily. Equate Vision Formula 50+     . sertraline (ZOLOFT) 50 MG tablet TAKE 1 TABLET BY MOUTH EVERY DAY (Patient taking differently: Take 50 mg by mouth daily. ) 90 tablet 2  . TRUEPLUS LANCETS 30G MISC Use one lancet each time sugars are tested. Pt tests twice daily. Dx. E11.9 100 each 3  . umeclidinium-vilanterol (ANORO ELLIPTA) 62.5-25 MCG/INH AEPB Inhale 1 puff into the lungs daily. 1 each 0  . carvedilol (COREG) 6.25 MG tablet Take 1 tablet (6.25 mg total) by mouth 2 (two) times daily. 180 tablet 3   No current facility-administered medications for this visit.     Allergies:   Patient has no known allergies.    Social History:  The patient  reports that she has never smoked. She has never used smokeless tobacco. She reports that she does not drink alcohol or use drugs.   Family History:  The patient's family history includes ADD / ADHD in her mother; Alcohol abuse in her mother; Dementia in her mother. She was  adopted.    ROS:  Please see the history of present illness.   Otherwise, review of systems are positive for none.   All other systems are reviewed and negative.    PHYSICAL EXAM: VS:  BP (!) 163/69 (BP Location: Right Arm)   Pulse 72   Temp (!) 97.1 F (36.2 C)   Ht 5' (1.524 m)   Wt 206 lb (93.4 kg)   LMP 06/25/2008   SpO2 95%   BMI 40.23 kg/m  , BMI Body mass index is 40.23 kg/m. GEN: Well nourished, obese, in no acute distress  HEENT: normal  Neck: no JVD, carotid bruits, or masses Cardiac: RRR; no murmurs, rubs, or gallops,no edema  Respiratory:  clear to auscultation bilaterally, normal work of  breathing GI: soft, nontender, nondistended, + BS MS: no deformity or atrophy  Skin: warm and dry, no rash Neuro:  Strength and sensation are intact Psych: euthymic mood, full affect   EKG:  EKG is not ordered today. The ekg ordered today demonstrates N/A   Recent Labs: 02/06/2019: TSH 2.05 03/03/2019: ALT 38; B Natriuretic Peptide 50.8 03/20/2019: Hemoglobin 12.0; Platelets 339.0 04/03/2019: BUN 35; Creatinine, Ser 1.19; Potassium 4.4; Sodium 142    Lipid Panel    Component Value Date/Time   CHOL 132 02/06/2019 1340   TRIG 104.0 02/06/2019 1340   HDL 52.50 02/06/2019 1340   CHOLHDL 3 02/06/2019 1340   VLDL 20.8 02/06/2019 1340   LDLCALC 59 02/06/2019 1340   LDLDIRECT 55.0 10/21/2017 1503      Wt Readings from Last 3 Encounters:  05/06/19 206 lb (93.4 kg)  04/24/19 206 lb (93.4 kg)  04/03/19 207 lb 3.2 oz (94 kg)      Other studies Reviewed: Additional studies/ records that were reviewed today include:  Myoview 02/02/19: Study Highlights    Nuclear stress EF: 54%.  There was no ST segment deviation noted during stress.  This is a low risk study.  The left ventricular ejection fraction is normal (55-65%).  The study is normal.   Low risk stress nuclear study with normal perfusion and normal left ventricular regional and global systolic function.       Echo 03/06/19: IMPRESSIONS    1. The left ventricle has hyperdynamic systolic function, with an ejection fraction of >65%. The cavity size was normal. There is moderate concentric left ventricular hypertrophy. Left ventricular diastolic Doppler parameters are consistent with  impaired relaxation. Elevated mean left atrial pressure No evidence of left ventricular regional wall motion abnormalities.  2. The right ventricle has normal systolic function. The cavity was normal. There is no increase in right ventricular wall thickness. Right ventricular systolic pressure could not be assessed.  3. Left atrial size was mildly dilated.  4. Trivial pericardial effusion is present.  5. The aorta is normal unless otherwise noted.  PORTABLE CHEST 1 VIEW  COMPARISON:  Chest radiograph dated 03/05/2019  FINDINGS: Chronic interstitial coarsening and mild bronchitic changes. No focal consolidation, pleural effusion, or pneumothorax. Linear density in the left mid lung field, likely atelectatic changes. The cardiac silhouette is within normal limits. No acute osseous pathology.  IMPRESSION: No active disease.   Electronically Signed   By: Anner Crete M.D.   On: 03/11/2019 10:21  CT CHEST WITHOUT CONTRAST  TECHNIQUE: Multidetector CT imaging of the chest was performed following the standard protocol without IV contrast.  COMPARISON:  CT 04/05/2015  FINDINGS: Cardiovascular: Normal heart size. No pericardial effusion. Atherosclerotic calcification of the aortic leaflets is noted. There is mitral annular calcification is well. The aorta is normal caliber with atheromatous plaque. Additional calcifications are present in the proximal great vessels and upper abdominal aorta. Major venous structures are unremarkable.  Mediastinum/Nodes: Numerous though nonenlarged mediastinal nodes are present. Evaluation for hilar adenopathy is limited in the absence of contrast. No  axillary adenopathy. Thyroid gland and thoracic inlet are unremarkable. Bowing of the posterior trachea 0 likely related to imaging during exhalation. There is mild airways thickening. The esophagus is unremarkable.  Lungs/Pleura: Redemonstration of mosaic attenuation throughout the lungs superimposed on centrilobular and paraseptal emphysema. There is increasing bandlike areas of subsegmental atelectasis and/or scarring as well as additional atelectatic change posteriorly in the lung bases. No pneumothorax. No effusion. Stable subpleural area of scarring is  seen in the right middle lobe.  Upper Abdomen: Small accessory splenule. Mild bilateral symmetric perinephric stranding, a nonspecific finding though may correlate with either age or decreased renal function. Portion of the hepatic flexure is interposed anterior to the liver. Fatty replacement of the pancreas. No pancreatic ductal dilatation or surrounding inflammatory changes. No acute abnormalities present in the visualized portions of the upper abdomen.  Musculoskeletal: Multilevel degenerative changes are present in the imaged portions of the spine. No acute osseous abnormality or suspicious osseous lesion.  IMPRESSION: 1. Extensive atelectatic changes are present throughout the lungs with a dependent predominance. 2. Redemonstration of mosaic attenuation throughout the lungs superimposed on centrilobular and paraseptal emphysema, suggestive of small airways disease. 3. Mild bilateral symmetric perinephric stranding, a nonspecific finding though may correlate with either age or decreased renal function. 4. Aortic Atherosclerosis (ICD10-I70.0) and Emphysema (ICD10-J43.9).   Electronically Signed   By: Kreg Shropshire M.D.   On: 03/05/2019 22:41  ASSESSMENT AND PLAN:  1.  Acute on chronic diastolic CHF. Admitted in September with acute respiratory failure. Given clinical response this appears to be predominantly  acute on chronic diastolic CHF.  BNP low but may be masked by obesity. Echo with LVH but normal systolic function and no evidence of pulmonary HTN. She is clinically doing well. No edema and weight is staying down. Was 228 prior to admission. Will ease fluid restriction to 1.5 liters. Continue sodium restriction. Continue current lasix dose. Discussed when to take an extra lasix. Will arrange follow up in 3 months with APP  2.  Hypertension -BP is not well controlled. ACEi, spironolactone and HCTZ held in hospital. Will increase Coreg to 6.25 mg bid.   3.  ? Pulmonary HTN. Noted on initial Echo in 2016 and 2017 but not really noted since.   4.  Morbid Obesity  -I have encouraged her to loose weight   5.  OSA on CPAP. followed by pulmonary now.   Current medicines are reviewed at length with the patient today.  The patient does not have concerns regarding medicines.  The following changes have been made:  See above.  Labs/ tests ordered today include:  No orders of the defined types were placed in this encounter.    Disposition:   FU with APP in 3 months  Signed, Brylon Brenning [place], MD  05/06/2019 2:30 PM    [facility] [address] [place], [place], [address] Phone [phone], Fax [phone]

## 2019-05-04 ENCOUNTER — Other Ambulatory Visit: Payer: Self-pay | Admitting: *Deleted

## 2019-05-04 NOTE — Patient Outreach (Signed)
[facility]) Care Management  05/04/2019  Abbee Cremeens 1951/04/24 [id number]    Telephone Assessment-Successful (HF)  RN spoke with pt today and received an update on her ongoing management of care. Pt states she is doing well with ongoing [facility] services. States OT is finished, PT has two more visits and the RN will remain involved for the next few weeks awaiting pt to get established with her new cardiologist provider that is scheduled for this Wednesday.  RN inquired on her ongoing HF management of care as pt reports her weights for today 200 lbs, yesterday 199.2 lbs and last week 198.6 lbs. Pt verifies she is in the GREEN with no swelling or SOB. Pt is able to recite when she gains more then 2 lbs who to contact which is currently her [facility] but will contact her provider once this RN is no longer involved or not available. RN stress the importance of daily weights and staying in the GREEN as verified today. RN verified pt received the [facility] educational packet and will review. RN will follow up next month with pt and further inquired on pt's increase knowledge base and management of care related to her HF. Pt very active with her participation and appreciative for the calls and information provided.  PLAN: RN will continue to update pt's provider on pt's disposition with [facility] services and send updates accordingly based upon pt's goals of care. Will scheduled another virtual call next month.  THN CM Care Plan Problem One     Most Recent Value  Care Plan Problem One  Knowledge Deficit realted to CHF exacerbation  Role Documenting the Problem One  Care Management Coordinator  Care Plan for Problem One  Active  THN Long Term Goal   Pt will verbalize two symptoms of CHF exacerbation within the next 90 days.  THN Long Term Goal Start Date  03/27/19  Interventions for Problem One Long Term Goal  Will reiterate signs and symptoms and what to do with an action plan discussed today.  WIll  allow pt time ot review the printed material mail via [facility] on HF and re-evaluate on the next follow up call.  THN CM Short Term Goal #1   Pt will verbalize the plan of action in the YELLOW zone within the next 30 days.  THN CM Short Term Goal #1 Start Date  03/27/19  Interventions for Short Term Goal #1  Will discussed the HF zones and stress the improtance of Green/Yellow/Red zones. Will educate on the symptoms and again who to call if acute symtpoms should occur. Will extend to allow adherence with this goal..  THN CM Short Term Goal #2   Pt will maintain daily weights and note all readings for her providers within the next 30 days.  THN CM Short Term Goal #2 Start Date  03/27/19  [facility] CM Short Term Goal #2 Met Date  05/04/19      [name], RN Care Management Coordinator [place] Office [phone]

## 2019-05-05 ENCOUNTER — Other Ambulatory Visit: Payer: Self-pay | Admitting: Cardiovascular Disease

## 2019-05-06 ENCOUNTER — Ambulatory Visit (INDEPENDENT_AMBULATORY_CARE_PROVIDER_SITE_OTHER): Payer: Medicare Other | Admitting: Cardiology

## 2019-05-06 ENCOUNTER — Encounter: Payer: Self-pay | Admitting: Cardiology

## 2019-05-06 ENCOUNTER — Other Ambulatory Visit: Payer: Self-pay

## 2019-05-06 VITALS — BP 163/69 | HR 72 | Temp 97.1°F | Ht 60.0 in | Wt 206.0 lb

## 2019-05-06 DIAGNOSIS — I10 Essential (primary) hypertension: Secondary | ICD-10-CM

## 2019-05-06 DIAGNOSIS — G4733 Obstructive sleep apnea (adult) (pediatric): Secondary | ICD-10-CM

## 2019-05-06 DIAGNOSIS — J9601 Acute respiratory failure with hypoxia: Secondary | ICD-10-CM | POA: Diagnosis not present

## 2019-05-06 DIAGNOSIS — I5033 Acute on chronic diastolic (congestive) heart failure: Secondary | ICD-10-CM | POA: Diagnosis not present

## 2019-05-06 DIAGNOSIS — J449 Chronic obstructive pulmonary disease, unspecified: Secondary | ICD-10-CM | POA: Diagnosis not present

## 2019-05-06 DIAGNOSIS — I130 Hypertensive heart and chronic kidney disease with heart failure and stage 1 through stage 4 chronic kidney disease, or unspecified chronic kidney disease: Secondary | ICD-10-CM | POA: Diagnosis not present

## 2019-05-06 DIAGNOSIS — E1122 Type 2 diabetes mellitus with diabetic chronic kidney disease: Secondary | ICD-10-CM | POA: Diagnosis not present

## 2019-05-06 DIAGNOSIS — N183 Chronic kidney disease, stage 3 unspecified: Secondary | ICD-10-CM | POA: Diagnosis not present

## 2019-05-06 MED ORDER — CARVEDILOL 6.25 MG PO TABS: 6.2500 mg | ORAL_TABLET | Freq: Two times a day (BID) | ORAL | 3 refills | Status: DC

## 2019-05-06 NOTE — Patient Instructions (Signed)
Continue your current therapy except increase Carvedilol to 6.25 mg twice a day for blood pressure  Continue to monitor your weight daily - I would be concerned if you gain 2-3 lbs in a day or 5 lbs in a week. For this you can take an extra lasix.   Continue sodium restriction  You can increase your fluid restriction to 1.5 liters per day  Follow up in 3 months

## 2019-05-07 DIAGNOSIS — N183 Chronic kidney disease, stage 3 unspecified: Secondary | ICD-10-CM | POA: Diagnosis not present

## 2019-05-07 DIAGNOSIS — I130 Hypertensive heart and chronic kidney disease with heart failure and stage 1 through stage 4 chronic kidney disease, or unspecified chronic kidney disease: Secondary | ICD-10-CM | POA: Diagnosis not present

## 2019-05-07 DIAGNOSIS — J449 Chronic obstructive pulmonary disease, unspecified: Secondary | ICD-10-CM | POA: Diagnosis not present

## 2019-05-07 DIAGNOSIS — J9601 Acute respiratory failure with hypoxia: Secondary | ICD-10-CM | POA: Diagnosis not present

## 2019-05-07 DIAGNOSIS — E1122 Type 2 diabetes mellitus with diabetic chronic kidney disease: Secondary | ICD-10-CM | POA: Diagnosis not present

## 2019-05-07 DIAGNOSIS — I5033 Acute on chronic diastolic (congestive) heart failure: Secondary | ICD-10-CM | POA: Diagnosis not present

## 2019-05-13 DIAGNOSIS — I130 Hypertensive heart and chronic kidney disease with heart failure and stage 1 through stage 4 chronic kidney disease, or unspecified chronic kidney disease: Secondary | ICD-10-CM | POA: Diagnosis not present

## 2019-05-13 DIAGNOSIS — N183 Chronic kidney disease, stage 3 unspecified: Secondary | ICD-10-CM | POA: Diagnosis not present

## 2019-05-13 DIAGNOSIS — J449 Chronic obstructive pulmonary disease, unspecified: Secondary | ICD-10-CM | POA: Diagnosis not present

## 2019-05-13 DIAGNOSIS — E1122 Type 2 diabetes mellitus with diabetic chronic kidney disease: Secondary | ICD-10-CM | POA: Diagnosis not present

## 2019-05-13 DIAGNOSIS — I5033 Acute on chronic diastolic (congestive) heart failure: Secondary | ICD-10-CM | POA: Diagnosis not present

## 2019-05-13 DIAGNOSIS — J9601 Acute respiratory failure with hypoxia: Secondary | ICD-10-CM | POA: Diagnosis not present

## 2019-05-14 DIAGNOSIS — E1122 Type 2 diabetes mellitus with diabetic chronic kidney disease: Secondary | ICD-10-CM | POA: Diagnosis not present

## 2019-05-14 DIAGNOSIS — I130 Hypertensive heart and chronic kidney disease with heart failure and stage 1 through stage 4 chronic kidney disease, or unspecified chronic kidney disease: Secondary | ICD-10-CM | POA: Diagnosis not present

## 2019-05-14 DIAGNOSIS — I5033 Acute on chronic diastolic (congestive) heart failure: Secondary | ICD-10-CM | POA: Diagnosis not present

## 2019-05-14 DIAGNOSIS — J9601 Acute respiratory failure with hypoxia: Secondary | ICD-10-CM | POA: Diagnosis not present

## 2019-05-14 DIAGNOSIS — N183 Chronic kidney disease, stage 3 unspecified: Secondary | ICD-10-CM | POA: Diagnosis not present

## 2019-05-14 DIAGNOSIS — J449 Chronic obstructive pulmonary disease, unspecified: Secondary | ICD-10-CM | POA: Diagnosis not present

## 2019-05-17 ENCOUNTER — Other Ambulatory Visit: Payer: Self-pay | Admitting: Family Medicine

## 2019-05-19 DIAGNOSIS — Z9989 Dependence on other enabling machines and devices: Secondary | ICD-10-CM | POA: Diagnosis not present

## 2019-05-19 DIAGNOSIS — I272 Pulmonary hypertension, unspecified: Secondary | ICD-10-CM | POA: Diagnosis not present

## 2019-05-19 DIAGNOSIS — Z7984 Long term (current) use of oral hypoglycemic drugs: Secondary | ICD-10-CM | POA: Diagnosis not present

## 2019-05-19 DIAGNOSIS — G4733 Obstructive sleep apnea (adult) (pediatric): Secondary | ICD-10-CM | POA: Diagnosis not present

## 2019-05-19 DIAGNOSIS — N183 Chronic kidney disease, stage 3 unspecified: Secondary | ICD-10-CM | POA: Diagnosis not present

## 2019-05-19 DIAGNOSIS — I5033 Acute on chronic diastolic (congestive) heart failure: Secondary | ICD-10-CM | POA: Diagnosis not present

## 2019-05-19 DIAGNOSIS — E7800 Pure hypercholesterolemia, unspecified: Secondary | ICD-10-CM | POA: Diagnosis not present

## 2019-05-19 DIAGNOSIS — E1122 Type 2 diabetes mellitus with diabetic chronic kidney disease: Secondary | ICD-10-CM | POA: Diagnosis not present

## 2019-05-19 DIAGNOSIS — Z6839 Body mass index (BMI) 39.0-39.9, adult: Secondary | ICD-10-CM | POA: Diagnosis not present

## 2019-05-19 DIAGNOSIS — I130 Hypertensive heart and chronic kidney disease with heart failure and stage 1 through stage 4 chronic kidney disease, or unspecified chronic kidney disease: Secondary | ICD-10-CM | POA: Diagnosis not present

## 2019-05-20 DIAGNOSIS — E1122 Type 2 diabetes mellitus with diabetic chronic kidney disease: Secondary | ICD-10-CM | POA: Diagnosis not present

## 2019-05-20 DIAGNOSIS — I130 Hypertensive heart and chronic kidney disease with heart failure and stage 1 through stage 4 chronic kidney disease, or unspecified chronic kidney disease: Secondary | ICD-10-CM | POA: Diagnosis not present

## 2019-05-20 DIAGNOSIS — N183 Chronic kidney disease, stage 3 unspecified: Secondary | ICD-10-CM | POA: Diagnosis not present

## 2019-05-20 DIAGNOSIS — G4733 Obstructive sleep apnea (adult) (pediatric): Secondary | ICD-10-CM | POA: Diagnosis not present

## 2019-05-20 DIAGNOSIS — I272 Pulmonary hypertension, unspecified: Secondary | ICD-10-CM | POA: Diagnosis not present

## 2019-05-20 DIAGNOSIS — I5033 Acute on chronic diastolic (congestive) heart failure: Secondary | ICD-10-CM | POA: Diagnosis not present

## 2019-05-25 ENCOUNTER — Encounter: Payer: Self-pay | Admitting: Family Medicine

## 2019-05-25 ENCOUNTER — Ambulatory Visit (INDEPENDENT_AMBULATORY_CARE_PROVIDER_SITE_OTHER): Payer: Medicare Other | Admitting: Family Medicine

## 2019-05-25 ENCOUNTER — Other Ambulatory Visit: Payer: Self-pay

## 2019-05-25 VITALS — BP 131/74 | HR 66 | Temp 97.9°F | Resp 17 | Ht 60.0 in | Wt 203.1 lb

## 2019-05-25 DIAGNOSIS — I10 Essential (primary) hypertension: Secondary | ICD-10-CM | POA: Diagnosis not present

## 2019-05-25 DIAGNOSIS — E1121 Type 2 diabetes mellitus with diabetic nephropathy: Secondary | ICD-10-CM

## 2019-05-25 DIAGNOSIS — N179 Acute kidney failure, unspecified: Secondary | ICD-10-CM | POA: Diagnosis not present

## 2019-05-25 DIAGNOSIS — E785 Hyperlipidemia, unspecified: Secondary | ICD-10-CM | POA: Diagnosis not present

## 2019-05-25 DIAGNOSIS — N1832 Chronic kidney disease, stage 3b: Secondary | ICD-10-CM

## 2019-05-25 LAB — BASIC METABOLIC PANEL
BUN: 45 mg/dL — ABNORMAL HIGH (ref 6–23)
CO2: 24 mEq/L (ref 19–32)
Calcium: 10.1 mg/dL (ref 8.4–10.5)
Chloride: 104 mEq/L (ref 96–112)
Creatinine, Ser: 1.15 mg/dL (ref 0.40–1.20)
GFR: 47.04 mL/min — ABNORMAL LOW (ref >60.00)
Glucose, Bld: 187 mg/dL — ABNORMAL HIGH (ref 70–99)
Potassium: 4.6 mEq/L (ref 3.5–5.1)
Sodium: 138 mEq/L (ref 135–145)

## 2019-05-25 LAB — CBC WITH DIFFERENTIAL/PLATELET
Basophils Absolute: 0 10*3/uL (ref 0.0–0.1)
Basophils Relative: 0.4 % (ref 0.0–3.0)
Eosinophils Absolute: 0.8 10*3/uL — ABNORMAL HIGH (ref 0.0–0.7)
Eosinophils Relative: 6.9 % — ABNORMAL HIGH (ref 0.0–5.0)
HCT: 38.8 % (ref 36.0–46.0)
Hemoglobin: 12.6 g/dL (ref 12.0–15.0)
Lymphocytes Relative: 19.9 % (ref 12.0–46.0)
Lymphs Abs: 2.3 10*3/uL (ref 0.7–4.0)
MCHC: 32.6 g/dL (ref 30.0–36.0)
MCV: 90.1 fl (ref 78.0–100.0)
Monocytes Absolute: 1 10*3/uL (ref 0.1–1.0)
Monocytes Relative: 8.3 % (ref 3.0–12.0)
Neutro Abs: 7.4 10*3/uL (ref 1.4–7.7)
Neutrophils Relative %: 64.5 % (ref 43.0–77.0)
Platelets: 272 10*3/uL (ref 150.0–400.0)
RBC: 4.31 Mil/uL (ref 3.87–5.11)
RDW: 15.3 % (ref 11.5–15.5)
WBC: 11.4 10*3/uL — ABNORMAL HIGH (ref 4.0–10.5)

## 2019-05-25 LAB — MICROALBUMIN / CREATININE URINE RATIO
Creatinine,U: 81.6 mg/dL
Microalb Creat Ratio: 5.8 mg/g (ref 0.0–30.0)
Microalb, Ur: 4.7 mg/dL — ABNORMAL HIGH (ref 0.0–1.9)

## 2019-05-25 LAB — LIPID PANEL
Cholesterol: 141 mg/dL (ref 0–200)
HDL: 45.4 mg/dL (ref >39.00)
LDL Cholesterol: 61 mg/dL (ref 0–99)
NonHDL: 95.36
Total CHOL/HDL Ratio: 3
Triglycerides: 172 mg/dL — ABNORMAL HIGH (ref 0.0–149.0)
VLDL: 34.4 mg/dL (ref 0.0–40.0)

## 2019-05-25 LAB — HEPATIC FUNCTION PANEL
ALT: 16 U/L (ref 0–35)
AST: 13 U/L (ref 0–37)
Albumin: 4.1 g/dL (ref 3.5–5.2)
Alkaline Phosphatase: 62 U/L (ref 39–117)
Bilirubin, Direct: 0.1 mg/dL (ref 0.0–0.3)
Total Bilirubin: 0.3 mg/dL (ref 0.2–1.2)
Total Protein: 6.8 g/dL (ref 6.0–8.3)

## 2019-05-25 LAB — TSH: TSH: 1.29 u[IU]/mL (ref 0.35–4.50)

## 2019-05-25 MED ORDER — FUROSEMIDE 20 MG PO TABS: 20.0000 mg | ORAL_TABLET | Freq: Every day | ORAL | 1 refills | Status: DC

## 2019-05-25 NOTE — Assessment & Plan Note (Signed)
Chronic problem.  Following w/ Dr Kelton Pillar.  Due for microalbumin- will do today.

## 2019-05-25 NOTE — Progress Notes (Signed)
   Subjective:    Patient ID: Kayla Brown, female    DOB: 06-15-1951, 68 y.o.   MRN: [id number]  HPI DM- chronic problem, following w/ Dr Kelton Pillar (Endo).  UTD on eye exam, foot exam.  Due for a microalbumin.  Endo is asking pt to see Nephrology- pt is hesitant to do this.  HTN- chronic problem, on Amlodipine 10mg  daily, Coreg 6.25mg  BID, Lasix 20mg  w/ adequate control.  Denies CP, SOB, HAs, visual changes, edema.  Hyperlipidemia- chronic problem, on Lipitor 40mg  daily.  No abd pain, N/V.  Obesity- pt is down 4 lbs since her September visit.  Pt is doing her home PT regularly.   Review of Systems For ROS see HPI   This visit occurred during the SARS-CoV-2 public health emergency.  Safety protocols were in place, including screening questions prior to the visit, additional usage of staff PPE, and extensive cleaning of exam room while observing appropriate contact time as indicated for disinfecting solutions.      Objective:   Physical Exam Vitals signs reviewed.  Constitutional:      General: She is not in acute distress.    Appearance: She is well-developed. She is obese.  HENT:     Head: Normocephalic and atraumatic.  Eyes:     Conjunctiva/sclera: Conjunctivae normal.     Pupils: Pupils are equal, round, and reactive to light.  Neck:     Musculoskeletal: Normal range of motion and neck supple.     Thyroid: No thyromegaly.  Cardiovascular:     Rate and Rhythm: Normal rate and regular rhythm.     Heart sounds: Normal heart sounds. No murmur.  Pulmonary:     Effort: Pulmonary effort is normal. No respiratory distress.     Breath sounds: Normal breath sounds.  Abdominal:     General: There is no distension.     Palpations: Abdomen is soft.     Tenderness: There is no abdominal tenderness.  Lymphadenopathy:     Cervical: No cervical adenopathy.  Skin:    General: Skin is warm and dry.  Neurological:     Mental Status: She is alert and oriented to person, place, and  time.  Psychiatric:        Behavior: Behavior normal.           Assessment & Plan:

## 2019-05-25 NOTE — Assessment & Plan Note (Signed)
Pt is down 4 lbs since last visit!  Applauded her efforts.  Will continue to follow. 

## 2019-05-25 NOTE — Assessment & Plan Note (Signed)
Chronic problem.  Tolerating statin w/o difficulty.  Stressed need for healthy diet and regular exercise.  Check labs.  Adjust meds prn  

## 2019-05-25 NOTE — Assessment & Plan Note (Signed)
Chronic problem.  Adequate control.  Asymptomatic.  Check labs.  Refill provided for Lasix at pt's request.  Check labs.  No anticipated med changes.

## 2019-05-25 NOTE — Patient Instructions (Addendum)
Follow up in 6 months to recheck BP and cholesterol We'll notify you of your lab results and make any changes if needed Continue to work on healthy diet and regular exercise- you can do it!! Call with any questions or concerns Stay Safe!  Stay Healthy! Happy Holidays!!! 

## 2019-05-26 ENCOUNTER — Encounter: Payer: Self-pay | Admitting: General Practice

## 2019-05-26 DIAGNOSIS — E1122 Type 2 diabetes mellitus with diabetic chronic kidney disease: Secondary | ICD-10-CM | POA: Diagnosis not present

## 2019-05-26 DIAGNOSIS — Z9989 Dependence on other enabling machines and devices: Secondary | ICD-10-CM

## 2019-05-26 DIAGNOSIS — I130 Hypertensive heart and chronic kidney disease with heart failure and stage 1 through stage 4 chronic kidney disease, or unspecified chronic kidney disease: Secondary | ICD-10-CM | POA: Diagnosis not present

## 2019-05-26 DIAGNOSIS — N183 Chronic kidney disease, stage 3 unspecified: Secondary | ICD-10-CM | POA: Diagnosis not present

## 2019-05-26 DIAGNOSIS — Z6839 Body mass index (BMI) 39.0-39.9, adult: Secondary | ICD-10-CM

## 2019-05-26 DIAGNOSIS — I5033 Acute on chronic diastolic (congestive) heart failure: Secondary | ICD-10-CM | POA: Diagnosis not present

## 2019-05-26 DIAGNOSIS — G4733 Obstructive sleep apnea (adult) (pediatric): Secondary | ICD-10-CM

## 2019-05-26 DIAGNOSIS — E7800 Pure hypercholesterolemia, unspecified: Secondary | ICD-10-CM

## 2019-05-26 DIAGNOSIS — I272 Pulmonary hypertension, unspecified: Secondary | ICD-10-CM

## 2019-05-26 DIAGNOSIS — Z7984 Long term (current) use of oral hypoglycemic drugs: Secondary | ICD-10-CM

## 2019-05-27 ENCOUNTER — Other Ambulatory Visit: Payer: Self-pay

## 2019-05-28 DIAGNOSIS — I130 Hypertensive heart and chronic kidney disease with heart failure and stage 1 through stage 4 chronic kidney disease, or unspecified chronic kidney disease: Secondary | ICD-10-CM | POA: Diagnosis not present

## 2019-05-28 DIAGNOSIS — G4733 Obstructive sleep apnea (adult) (pediatric): Secondary | ICD-10-CM | POA: Diagnosis not present

## 2019-05-28 DIAGNOSIS — N183 Chronic kidney disease, stage 3 unspecified: Secondary | ICD-10-CM | POA: Diagnosis not present

## 2019-05-28 DIAGNOSIS — I5033 Acute on chronic diastolic (congestive) heart failure: Secondary | ICD-10-CM | POA: Diagnosis not present

## 2019-05-28 DIAGNOSIS — E1122 Type 2 diabetes mellitus with diabetic chronic kidney disease: Secondary | ICD-10-CM | POA: Diagnosis not present

## 2019-05-28 DIAGNOSIS — I272 Pulmonary hypertension, unspecified: Secondary | ICD-10-CM | POA: Diagnosis not present

## 2019-05-29 ENCOUNTER — Encounter: Payer: Self-pay | Admitting: Internal Medicine

## 2019-05-29 ENCOUNTER — Ambulatory Visit (INDEPENDENT_AMBULATORY_CARE_PROVIDER_SITE_OTHER): Payer: Medicare Other | Admitting: Internal Medicine

## 2019-05-29 VITALS — BP 128/58 | HR 84 | Temp 98.4°F | Ht 60.0 in | Wt 200.5 lb

## 2019-05-29 DIAGNOSIS — E1121 Type 2 diabetes mellitus with diabetic nephropathy: Secondary | ICD-10-CM | POA: Diagnosis not present

## 2019-05-29 DIAGNOSIS — E1122 Type 2 diabetes mellitus with diabetic chronic kidney disease: Secondary | ICD-10-CM | POA: Insufficient documentation

## 2019-05-29 DIAGNOSIS — N1831 Chronic kidney disease, stage 3a: Secondary | ICD-10-CM | POA: Diagnosis not present

## 2019-05-29 LAB — POCT GLYCOSYLATED HEMOGLOBIN (HGB A1C): Hemoglobin A1C: 6.8 % — AB (ref 4.0–5.6)

## 2019-05-29 NOTE — Progress Notes (Signed)
Name: Kayla Brown  Age/ Sex: 68 y.o., female   MRN/ DOB: [id number], Jul 07, 1950     PCP: Sheliah Hatch, MD   Reason for Endocrinology Evaluation: Type 2 Diabetes Mellitus  Initial Endocrine Consultative Visit: 04/06/2019    PATIENT IDENTIFIER: Kayla Brown is a 68 y.o. female with a past medical history of HTN, CHF and T2DM . The patient has followed with Endocrinology clinic since 04/06/2019 for consultative assistance with management of her diabetes.  DIABETIC HISTORY:  Ms. Haubner was diagnosed with T2DM in 2001. She was unable to start Jardiance due to cost . Actos had to be stopped due to CHF. Her hemoglobin A1c has ranged from 6.7% in 2017, peaking at 8.1% in 2020  On her initial visit to our clinic she had an A1c of 8.0%. She was on [employer] and Metformin. We switched Glipizide XL to regular Glipizide and continued metformin     SUBJECTIVE:   During the last visit (04/06/2019): A1c of 8.0%. She was on [employer] and Metformin. We switched Glipizide XL to regular Glipizide and continued metformin    Today (05/29/2019): Kayla Brown is here for a follow up on diabetes management.  She checks her blood sugars 2 times daily, preprandial to breakfast and bedtime. The patient has not had hypoglycemic episodes since the last clinic visit. Otherwise, the patient has not required any recent emergency interventions for hypoglycemia and has not had recent hospitalizations secondary to hyper or hypoglycemic episodes.    ROS: As per HPI and as detailed below: Review of Systems  Constitutional: Negative for chills and fever.  HENT: Negative for congestion and sore throat.   Respiratory: Negative for cough and shortness of breath.   Cardiovascular: Negative for chest pain and palpitations.  Gastrointestinal: Negative for diarrhea and nausea.      HOME DIABETES REGIMEN:  Metformin 500 mg 2 tabs BID  Glipizide 5 mg with Breakfast and 2.5 mg with supper    GLUCOSE  LOG:  BG's 86- 200 mg/dL    DIABETIC COMPLICATIONS: Microvascular complications:   CKD   Denies: neuropathy , retinopathy   Last eye exam: Completed 06/2018  Macrovascular complications:   CHF  Denies: CAD, PVD, CVA  HISTORY:  Past Medical History:  Past Medical History:  Diagnosis Date  . Anxiety   . Diabetes mellitus   . Edema of extremities 03/02/2016  . Essential hypertension, benign   . Lazy eye   . Major depressive disorder, single episode, unspecified   . Obesity (BMI 30-39.9) 03/01/2016  . OSA (obstructive sleep apnea)   . Pulmonary HTN (HCC)    mild with PASP by echo 05/2016  . Pure hypercholesterolemia   . Retinopathy, due to hypertension    Past Surgical History:  Past Surgical History:  Procedure Laterality Date  . CPAP TITRATION  10/14/2015  . DILATION AND CURETTAGE OF UTERUS  2005-2008   x2  . EYE SURGERY     cataracts  . WISDOM TOOTH EXTRACTION      Social History:  reports that she has never smoked. She has never used smokeless tobacco. She reports that she does not drink alcohol or use drugs. Family History:  Family History  Adopted: Yes  Problem Relation Age of Onset  . Alcohol abuse Mother   . ADD / ADHD Mother   . Dementia Mother   . Breast cancer Neg Hx      HOME MEDICATIONS: Allergies as of 05/29/2019   No Known Allergies  Medication List       Accurate as of May 29, 2019  3:24 PM. If you have any questions, ask your nurse or doctor.        albuterol 108 (90 Base) MCG/ACT inhaler Commonly known as: ProAir HFA Inhale 1-2 puffs into the lungs every 6 (six) hours as needed for wheezing or shortness of breath.   Alcohol Prep Pads Pt uses an alcohol pad each time sugars are tested. Pt tests twice daily. Dx E11.9   amLODipine 10 MG tablet Commonly known as: NORVASC TAKE 1 TABLET BY MOUTH EVERY DAY   Anoro Ellipta 62.5-25 MCG/INH Aepb Generic drug: umeclidinium-vilanterol Inhale 1 puff into the lungs daily.    atorvastatin 40 MG tablet Commonly known as: LIPITOR TAKE 1 TABLET BY MOUTH EVERY DAY What changed: when to take this   carvedilol 6.25 MG tablet Commonly known as: COREG Take 1 tablet (6.25 mg total) by mouth 2 (two) times daily.   ciclopirox 8 % solution Commonly known as: PENLAC Apply topically at bedtime. Apply over nail and surrounding skin. Apply daily over previous coat. After seven (7) days, may remove with alcohol and continue cycle.   fish oil-omega-3 fatty acids 1000 MG capsule Take 1 g by mouth daily.   fluticasone 50 MCG/ACT nasal spray Commonly known as: FLONASE SPRAY 2 SPRAYS INTO EACH NOSTRIL EVERY DAY What changed: See the new instructions.   furosemide 20 MG tablet Commonly known as: Lasix Take 1 tablet (20 mg total) by mouth daily.   glipiZIDE 5 MG tablet Commonly known as: GLUCOTROL Take 1 tablet (5 mg total) by mouth daily before breakfast AND 0.5 tablets (2.5 mg total) daily before supper.   glucose blood test strip Commonly known as: FREESTYLE LITE USE TO TEST BLOOD GLUCOSE 2 TIMES DAILY. Dx E11.9   loratadine 10 MG tablet Commonly known as: CLARITIN TAKE 1 TABLET BY MOUTH EVERY DAY   Melatonin 3 MG Tabs Take 3 mg by mouth at bedtime.   metFORMIN 500 MG 24 hr tablet Commonly known as: GLUCOPHAGE-XR TAKE 2 TABLETS BY MOUTH TWICE DAILY BEFORE MEAL   OVER THE COUNTER MEDICATION Take 1 tablet by mouth daily. Equate Vision Formula 50+   sertraline 50 MG tablet Commonly known as: ZOLOFT TAKE 1 TABLET BY MOUTH EVERY DAY   TRUEplus Lancets 30G Misc Use one lancet each time sugars are tested. Pt tests twice daily. Dx. E11.9   Vision Formula Tabs Take 1 tablet by mouth daily.   VITAMIN D PO Take 2,000 Units by mouth daily.        OBJECTIVE:   Vital Signs: BP (!) 128/58 (BP Location: Left Arm, Patient Position: Sitting, Cuff Size: Large)   Pulse 84   Temp 98.4 F (36.9 C)   Ht 5' (1.524 m)   Wt 200 lb 8 oz (90.9 kg)   LMP 06/25/2008    SpO2 97%   BMI 39.16 kg/m   Wt Readings from Last 3 Encounters:  05/29/19 200 lb 8 oz (90.9 kg)  05/25/19 203 lb 2 oz (92.1 kg)  05/06/19 206 lb (93.4 kg)     Exam: General: Pt appears well and is in NAD  Neck: General: Supple without adenopathy. Thyroid: Thyroid size normal.  No goiter or nodules appreciated. No thyroid bruit.  Lungs: Clear with good BS bilat with no rales, rhonchi, or wheezes  Heart: RRR with normal S1 and S2 and no gallops; no murmurs; no rub  Abdomen: Normoactive bowel sounds, soft, nontender, without masses  or organomegaly palpable  Extremities: No pretibial edema.   Neuro: MS is good with appropriate affect, pt is alert and Ox3   DM foot exam: 04/03/2019  The skin of the feet is without sores or ulcerations. The pedal pulses are 2+ on right and 2+ on left. The sensation is intact to a screening 5.07, 10 gram monofilament bilaterally    DATA REVIEWED:  Lab Results  Component Value Date   HGBA1C 6.8 (A) 05/29/2019   HGBA1C 8.0 (H) 03/06/2019   HGBA1C 7.8 (H) 03/04/2019   Lab Results  Component Value Date   MICROALBUR 4.7 (H) 05/25/2019   LDLCALC 61 05/25/2019   CREATININE 1.15 05/25/2019   Lab Results  Component Value Date   MICRALBCREAT 5.8 05/25/2019     Lab Results  Component Value Date   CHOL 141 05/25/2019   HDL 45.40 05/25/2019   LDLCALC 61 05/25/2019   LDLDIRECT 55.0 10/21/2017   TRIG 172.0 (H) 05/25/2019   CHOLHDL 3 05/25/2019         ASSESSMENT / PLAN / RECOMMENDATIONS:   1) Type 2 Diabetes Mellitus, Optimally controlled, With CKD III complications - Most recent A1c of 6.8 %. Goal A1c < 7.0%.  Down from 8.0%   - I have congratulated her on the great work she has done with lifestyle changes and weight loss.  - No changes will be made today     MEDICATIONS:  Glipizide 5 mg, 1 tablet Before breakfast and Half a tablet Before Supper    Metformin 500 mg 2 tablets with Breakfast and 2 tablets before Supper    EDUCATION / INSTRUCTIONS:  BG monitoring instructions: Patient is instructed to check her blood sugars 2 times a day, fasting and Supper time   [facility] if: BG persistently < 70 or > 300. . I reviewed the Rule of 15 for the treatment of hypoglycemia in detail with the patient. Literature supplied.     F/U in 3 months    Signed electronically by: Lyndle HerrlichAbby Jaralla Shamleffer, MD  [facility]  [facility] [address] [place], [place] [address] Phone: [phone] FAX: [phone]   CC: Sheliah Hatchabori, Katherine E, MD [address] [place] [address] Phone: [phone]  Fax: [phone]  Return to Endocrinology clinic as below: Future Appointments  Date Time Provider Department Center  06/01/2019 11:00 AM Alejandro MullingMatthews, Lisa D, RN [facility]-COM None  06/15/2019  3:30 PM Waymon BudgeYoung, Clinton D, MD L[facility]-PULCARE None  08/10/2019  2:45 PM Jodelle GrossLawrence, Kathryn M, NP [facility]  08/28/2019  2:00 PM Shamleffer, Konrad DoloresIbtehal Jaralla, MD [facility]-LBENDO None  11/19/2019  2:00 PM Sheliah Hatchabori, Katherine E, MD LB[facility]-SV PEC

## 2019-05-29 NOTE — Patient Instructions (Addendum)
-   keep up the Good Work!!! You have done an awesome work.   -  Glipizide 1 tablet Before breakfast and Half a tablet Before Supper   - Metformin 500 mg 2 tablets with Breakfast and 2 tablets before Supper     - HOW TO TREAT LOW BLOOD SUGARS (Blood sugar LESS THAN 70 MG/DL)  Please follow the RULE OF 15 for the treatment of hypoglycemia treatment (when your (blood sugars are less than 70 mg/dL)    STEP 1: Take 15 grams of carbohydrates when your blood sugar is low, which includes:   3-4 GLUCOSE TABS  OR  3-4 OZ OF JUICE OR REGULAR SODA OR  ONE TUBE OF GLUCOSE GEL     STEP 2: RECHECK blood sugar in 15 MINUTES STEP 3: If your blood sugar is still low at the 15 minute recheck --> then, go back to STEP 1 and treat AGAIN with another 15 grams of carbohydrates.  

## 2019-06-01 ENCOUNTER — Other Ambulatory Visit: Payer: Self-pay | Admitting: *Deleted

## 2019-06-01 NOTE — Patient Outreach (Signed)
[facility]) Care Management  06/01/2019  Ahriyah Vannest 1951/02/16 [id number]    Telephone Assessment-Unsuccessful  RN attempted outreach call today however unsuccessful. RN able to leave a HIPAA approved voice message requesting a call back.   Plan: Will attempt another outreach call to continue engagement to [facility] services.  [name], RN Care Management Coordinator [place] Office [phone]

## 2019-06-03 ENCOUNTER — Other Ambulatory Visit: Payer: Self-pay | Admitting: *Deleted

## 2019-06-03 NOTE — Patient Outreach (Addendum)
Kayla Brown) Care Management  06/03/2019  Kayla Brown 07-15-1950 [id number]    Telephone Assessment-Successful-HF  Pt returned a call as RN followed up accordingly.  RN spoke with pt today and received an update on her ongoing management of care related to her HF. Pt states she is doing very well. Reports her daily weights over the last week 198 lbs, yesterday 195.8 lbs and today 197 lbs with no acute symptoms. Pt remains in the GREEN zone and able to recite symptoms in the YELLOW zone. RN reiterated on what to do if she has such symptoms by contacting her provider however if acute pt aware to seek medical attention immediately. Pt reports she has a action plan and allowed to take an extra Lasix if her weight exceed more then the 2 lbs overnight. Pt will contact the provider's office to make them aware at that time. Reports she continues to worker with Kayla Brown for PT services til the end of this month prior to ending this service. Pt reports all upcoming appointments with her pulmonologist (obtain a new inhaler), Endocrinologist (reports A1c from 8 no at 6.8) and her primary provider last seen 11/30 and with a follow up in 6 months.   RN review the current plan of care and updated accordingly all interventions and those goal met based upon pt's ongoing management of care. Praise pt for her efforts in managing her care and continue to encouraged adherence. Verified the received of Emmi and printed material vi [facility]. Reviewed and continue to encourage pt on early interventions to avoid acute events.  Based upon pt's progress will wean to quarterly follow up call and continue to work with pt on managing her ongoing HF.  THN CM Care Plan Problem One     Most Recent Value  Care Plan Problem One  Knowledge Deficit realted to CHF exacerbation  Role Documenting the Problem One  Care Management Coordinator  Care Plan for Problem One  Active  THN Long Term Goal   Pt will verbalize two  symptoms of CHF exacerbation within the next 90 days.  THN Long Term Goal Start Date  03/27/19  Interventions for Problem One Long Term Goal  Will review printed material and verify pt remains in the GREEN zone.  Will encourage pt to continue to manage her HF   THN CM Short Term Goal #1   Pt will verbalize the plan of action in the YELLOW zone within the next 30 days.  THN CM Short Term Goal #1 Start Date  03/27/19  [facility] CM Short Term Goal #1 Met Date  06/03/19      Kayla Mina, RN Care Management Coordinator [place] Office [phone]

## 2019-06-04 DIAGNOSIS — I272 Pulmonary hypertension, unspecified: Secondary | ICD-10-CM | POA: Diagnosis not present

## 2019-06-04 DIAGNOSIS — G4733 Obstructive sleep apnea (adult) (pediatric): Secondary | ICD-10-CM | POA: Diagnosis not present

## 2019-06-04 DIAGNOSIS — I130 Hypertensive heart and chronic kidney disease with heart failure and stage 1 through stage 4 chronic kidney disease, or unspecified chronic kidney disease: Secondary | ICD-10-CM | POA: Diagnosis not present

## 2019-06-04 DIAGNOSIS — E1122 Type 2 diabetes mellitus with diabetic chronic kidney disease: Secondary | ICD-10-CM | POA: Diagnosis not present

## 2019-06-04 DIAGNOSIS — I5033 Acute on chronic diastolic (congestive) heart failure: Secondary | ICD-10-CM | POA: Diagnosis not present

## 2019-06-04 DIAGNOSIS — N183 Chronic kidney disease, stage 3 unspecified: Secondary | ICD-10-CM | POA: Diagnosis not present

## 2019-06-09 ENCOUNTER — Ambulatory Visit: Payer: Self-pay | Admitting: *Deleted

## 2019-06-11 DIAGNOSIS — N183 Chronic kidney disease, stage 3 unspecified: Secondary | ICD-10-CM | POA: Diagnosis not present

## 2019-06-11 DIAGNOSIS — I130 Hypertensive heart and chronic kidney disease with heart failure and stage 1 through stage 4 chronic kidney disease, or unspecified chronic kidney disease: Secondary | ICD-10-CM | POA: Diagnosis not present

## 2019-06-11 DIAGNOSIS — G4733 Obstructive sleep apnea (adult) (pediatric): Secondary | ICD-10-CM | POA: Diagnosis not present

## 2019-06-11 DIAGNOSIS — I272 Pulmonary hypertension, unspecified: Secondary | ICD-10-CM | POA: Diagnosis not present

## 2019-06-11 DIAGNOSIS — I5033 Acute on chronic diastolic (congestive) heart failure: Secondary | ICD-10-CM | POA: Diagnosis not present

## 2019-06-11 DIAGNOSIS — E1122 Type 2 diabetes mellitus with diabetic chronic kidney disease: Secondary | ICD-10-CM | POA: Diagnosis not present

## 2019-06-13 ENCOUNTER — Other Ambulatory Visit: Payer: Self-pay | Admitting: Family Medicine

## 2019-06-15 ENCOUNTER — Other Ambulatory Visit: Payer: Self-pay

## 2019-06-15 ENCOUNTER — Ambulatory Visit (INDEPENDENT_AMBULATORY_CARE_PROVIDER_SITE_OTHER): Payer: Medicare Other | Admitting: Internal Medicine

## 2019-06-15 ENCOUNTER — Encounter: Payer: Self-pay | Admitting: Internal Medicine

## 2019-06-15 DIAGNOSIS — R0600 Dyspnea, unspecified: Secondary | ICD-10-CM | POA: Diagnosis not present

## 2019-06-15 DIAGNOSIS — J811 Chronic pulmonary edema: Secondary | ICD-10-CM

## 2019-06-15 DIAGNOSIS — G4733 Obstructive sleep apnea (adult) (pediatric): Secondary | ICD-10-CM | POA: Diagnosis not present

## 2019-06-15 DIAGNOSIS — R0609 Other forms of dyspnea: Secondary | ICD-10-CM

## 2019-06-15 NOTE — Patient Instructions (Signed)
We will get [employer] to send [place] the most recent download from your CPAP  You can keep on using CPAP every night.  Script sent for albuterol rescue inhaler    Inhale 2 puffs every 6 hours, IF NEEDED, for shortness of breath. If it doesn't seem to be helping, then you can let us know, but be quick also to check with your cardiologist.   Please call if we can help

## 2019-06-15 NOTE — Progress Notes (Signed)
HPI F never smoker followed for OSA, Dyspnea improved after treatment for heart failure. Complicated by anxiety and depression, DM2, HBP, Hyperlipidemia, Morbid obesity, OSA, Pulmonary Hypertension(PASP 25mmHg by echo 02/2015),   NPSG 05/14/16 AHI 93/ hr, desaturation to 84% PFT 04/04/15   - mild Diffusion defect, normal flows and volumes without response to bronchodilator PFT 04/24/2019- minimal obst w airtrapping, sl resp to BD, Nl DLCO 2D echo February 02, 2019 EF 25-95%, normal systolic function right ventricle, mild to moderately dilated left atrium, RV systolic function appeared to be normal with normal RV size 2D echo March 06, 2019 EF greater than 65%, moderate LVH, right ventricle normal systolic function, right ventricle systolic pressure could not be assessed. CT chest March 05, 2019 extensive atelectatic changes throughout the lungs with a dependent predominance, mosaic attenuation throughout the lungs, emphysema VQ scan 03/04/2019 no unmatched segmental perfusion defects to suggest acute PE Autoimmune work-up negative 2020 , SH /Occupational hx unrevealing  ------------------------------------------------------------------------------------------  02/16/2019- 67 yoF never smoker for pulmonary evaluation of dyspnea, referred by Dr. Radford Pax (Cardiologist) for OSA; pt states she is currently on a CPAP machine, DME: [facility] problem list includes anxiety and depression, DM2, HBP, Hyperlipidemia, Morbid obesity, OSA, Pulmonary Hypertension(PASP 9mmHg by echo 02/2015) ,  She had OSA evaluation with good CPAP compliance per Dr Radford Pax. Dyspnea was evaluated by Echo and Myoscan and thought likely due to obesity, for which weight loss/ exercise was recommended.  NPSG 05/14/16 AHI 93/ hr, desaturation to 84% PFT 04/04/15   - mild Diffusion defect, normal flows and volumes without response to bronchodilator Denies cough or wheeze. Lives sin apartment- DOE with ADLs, walking, errands. She  says cardiology told her Pulm Hypertension is gone. She denies prior hx lung disease, doubts ever had pneumonia, PE, asthma. She is adopted- fam hx unknown.  06/15/2019-  67 yoF never smoker followed for OSA, Dyspnea improved after treatment for heart failure. Complicated by anxiety and depression, DM2, HBP, Hyperlipidemia, Morbid obesity, OSA, Pulmonary Hypertension(PASP 4mmHg by echo 02/2015),   Had seen NP the last 2 visits, doing better.  CPAP / [facility]- - reports using every night. No download.  Not using Anoro. Asks refill rescue hfa.  PFT 04/24/2019- minimal obst w airtrapping, sl resp to BD, Nl DLCO  CT chest 03/05/2019-  IMPRESSION: 1. Extensive atelectatic changes are present throughout the lungs with a dependent predominance. 2. Redemonstration of mosaic attenuation throughout the lungs superimposed on centrilobular and paraseptal emphysema, suggestive of small airways disease. 3. Mild bilateral symmetric perinephric stranding, a nonspecific finding though may correlate with either age or decreased renal function. 4. Aortic Atherosclerosis (ICD10-I70.0) and Emphysema (ICD10-J43.9).  // consider ONOX due to PHTN//  ROS-see HPI   + = positive Constitutional:    weight loss, night sweats, fevers, chills, fatigue, lassitude. HEENT:    headaches, difficulty swallowing, tooth/dental problems, sore throat,       sneezing, itching, ear ache, nasal congestion, post nasal drip, snoring CV:    chest pain, orthopnea, PND, swelling in lower extremities, anasarca,                                  dizziness, palpitations Resp:   +shortness of breath with exertion or at rest.                productive cough,   non-productive cough, coughing up of blood.  change in color of mucus.  wheezing.   Skin:    rash or lesions. GI:  No-   heartburn, indigestion, abdominal pain, nausea, vomiting, diarrhea,                 change in bowel habits, loss of appetite GU: dysuria, change in  color of urine, no urgency or frequency.   flank pain. MS:   joint pain, stiffness, decreased range of motion, back pain. Neuro-     nothing unusual Psych:  change in mood or affect.  depression or anxiety.   memory loss.  OBJ- Physical Exam General- Alert, Oriented, Affect-appropriate, Distress- none acute, + obese Skin- rash-none, lesions- none, excoriation- none Lymphadenopathy- none Head- atraumatic            Eyes- +strabismus            Ears- Hearing, canals-normal            Nose- Clear, no-Septal dev, mucus, polyps, erosion, perforation             Throat- Mallampati II , mucosa clear , drainage- none, tonsils- atrophic Neck- flexible , trachea midline, no stridor , thyroid nl, carotid no bruit Chest - symmetrical excursion , unlabored           Heart/CV- RRR , + trace S murmur , no gallop  , no rub, nl s1 s2                           - JVD- none , edema- none, stasis changes- none, varices- none           Lung- clear to P&A, wheeze- none, cough- none , dullness-none, rub- none           Chest wall-  Abd-  Br/ Gen/ Rectal- Not done, not indicated Extrem- cyanosis- none, clubbing, none, atrophy- none, strength- nl Neuro- grossly intact to observation

## 2019-06-16 ENCOUNTER — Telehealth: Payer: Self-pay | Admitting: Internal Medicine

## 2019-06-16 DIAGNOSIS — E1122 Type 2 diabetes mellitus with diabetic chronic kidney disease: Secondary | ICD-10-CM | POA: Diagnosis not present

## 2019-06-16 DIAGNOSIS — I5033 Acute on chronic diastolic (congestive) heart failure: Secondary | ICD-10-CM | POA: Diagnosis not present

## 2019-06-16 DIAGNOSIS — I130 Hypertensive heart and chronic kidney disease with heart failure and stage 1 through stage 4 chronic kidney disease, or unspecified chronic kidney disease: Secondary | ICD-10-CM | POA: Diagnosis not present

## 2019-06-16 DIAGNOSIS — G4733 Obstructive sleep apnea (adult) (pediatric): Secondary | ICD-10-CM | POA: Diagnosis not present

## 2019-06-16 DIAGNOSIS — I272 Pulmonary hypertension, unspecified: Secondary | ICD-10-CM | POA: Diagnosis not present

## 2019-06-16 DIAGNOSIS — N183 Chronic kidney disease, stage 3 unspecified: Secondary | ICD-10-CM | POA: Diagnosis not present

## 2019-06-16 MED ORDER — ALBUTEROL SULFATE HFA 108 (90 BASE) MCG/ACT IN AERS: 1.0000 | INHALATION_SPRAY | Freq: Four times a day (QID) | RESPIRATORY_TRACT | 3 refills | Status: DC | PRN

## 2019-06-16 NOTE — Telephone Encounter (Signed)
Rx for albuterol inhaler sent to pt's preferred pharmacy. Called and spoke with pt letting her know that it had been done and she verbalized understanding.nothing further needed.

## 2019-06-27 ENCOUNTER — Other Ambulatory Visit: Payer: Self-pay | Admitting: Internal Medicine

## 2019-06-28 ENCOUNTER — Other Ambulatory Visit: Payer: Self-pay | Admitting: Family Medicine

## 2019-07-28 ENCOUNTER — Encounter: Payer: Self-pay | Admitting: *Deleted

## 2019-07-28 DIAGNOSIS — E119 Type 2 diabetes mellitus without complications: Secondary | ICD-10-CM | POA: Diagnosis not present

## 2019-07-28 DIAGNOSIS — H524 Presbyopia: Secondary | ICD-10-CM | POA: Diagnosis not present

## 2019-07-28 LAB — HM DIABETES EYE EXAM

## 2019-08-02 NOTE — Assessment & Plan Note (Signed)
She reports CPAP "doing fine".  Plan- requesting download from [facility]

## 2019-08-02 NOTE — Assessment & Plan Note (Signed)
Hx of dCHF and CT 03/05/2019 showed mosaic attenuation.  Clinically feels stable. Not clear that she needs a maintenance bronchodialtor now. Will refill rescue inhaler.  Follow with cardiology.  Consider ONOX to assess need for sleep O2

## 2019-08-09 NOTE — Progress Notes (Signed)
Cardiology Office Note   Date:  08/10/2019   ID:  Kayla Brown, DOB [birth year], MRN [id number]  PCP:  Sheliah Hatch, MD  Cardiologist:  Dr.Jordan  CC: Hypertension follow up    History of Present Illness: Kayla Brown is a 69 y.o. female who presents for ongoing assessment and management of chronic dyspnea with history of OSA formally managed by Dr. Mayford Knife, pulmonary hypertension, based on echocardiogram, but repeat echocardiogram in 2019 in 2020 did not reveal elevated PA pressures.  She has severe OSA with an AHI of 36.9/hour and is followed by pulmonary now for management of this.  Most recent PFTs showed no airflow obstruction or restriction.  She continues on CPAP.  She had an admission in September 2020 with acute hypoxic respiratory failure.  Questionable pulmonary edema but BNP level was normal.  The patient improved with IV diuresis and pulmonary physical therapy, but developed acute kidney injury due to diuresis.  ACE inhibitor, Hygroton, spironolactone were discontinued.  She was diuresed 20 pounds during hospitalization.  She was last seen by Dr. [place] on 05/06/2019, she was found to be doing well and euvolemic from a cardiac standpoint.  She was keeping her weight down.  She was advised on continued dose of Lasix and to take an extra dose of Lasix for weight gain of 3 to 5 pounds.  She was to avoid salt.  She was to continue weight loss and increase exercise for improvement in overall health status.  She comes today with her list of daily weights and has lost 14 pounds since November 2020.  She is medically compliant.  She weighs herself daily and will take an extra dose of Lasix as needed.  She has not had to do so.  She is very strict on her salt intake and managing her diet.  Past Medical History:  Diagnosis Date  . Anxiety   . Diabetes mellitus   . Edema of extremities 03/02/2016  . Essential hypertension, benign   . Lazy eye   . Major depressive disorder, single  episode, unspecified   . Obesity (BMI 30-39.9) 03/01/2016  . OSA (obstructive sleep apnea)   . Pulmonary HTN (HCC)    mild with PASP by echo 05/2016  . Pure hypercholesterolemia   . Retinopathy, due to hypertension     Past Surgical History:  Procedure Laterality Date  . CPAP TITRATION  10/14/2015  . DILATION AND CURETTAGE OF UTERUS  2005-2008   x2  . EYE SURGERY     cataracts  . WISDOM TOOTH EXTRACTION       Current Outpatient Medications  Medication Sig Dispense Refill  . albuterol (PROAIR HFA) 108 (90 Base) MCG/ACT inhaler Inhale 1-2 puffs into the lungs every 6 (six) hours as needed for wheezing or shortness of breath. 18 g 3  . Alcohol Swabs (ALCOHOL PREP) PADS Pt uses an alcohol pad each time sugars are tested. Pt tests twice daily. Dx E11.9 100 each 3  . amLODipine (NORVASC) 10 MG tablet TAKE 1 TABLET BY MOUTH EVERY DAY (Patient taking differently: Take 10 mg by mouth daily. ) 90 tablet 1  . atorvastatin (LIPITOR) 40 MG tablet TAKE 1 TABLET BY MOUTH EVERY DAY 90 tablet 1  . carvedilol (COREG) 6.25 MG tablet Take 1 tablet (6.25 mg total) by mouth 2 (two) times daily. 180 tablet 3  . Cholecalciferol (VITAMIN D PO) Take 2,000 Units by mouth daily.     . ciclopirox (PENLAC) 8 % solution Apply topically at bedtime.  Apply over nail and surrounding skin. Apply daily over previous coat. After seven (7) days, may remove with alcohol and continue cycle. 6.6 mL 0  . fish oil-omega-3 fatty acids 1000 MG capsule Take 1 g by mouth daily.    . fluticasone (FLONASE) 50 MCG/ACT nasal spray SPRAY 2 SPRAYS INTO EACH NOSTRIL EVERY DAY (Patient taking differently: Place 2 sprays into both nostrils daily. ) 18 g 1  . furosemide (LASIX) 20 MG tablet Take 1 tablet (20 mg total) by mouth daily. 90 tablet 1  . glipiZIDE (GLUCOTROL) 5 MG tablet TAKE 1 TABLET (5 MG) BY MOUTH DAILY BEFORE BREAKFAST AND 1/2 TABLET (2.5 MG) DAILY BEFORE SUPPER. 135 tablet 1  . glucose blood (FREESTYLE LITE) test strip  USE TO TEST BLOOD GLUCOSE 2 TIMES DAILY. Dx E11.9 100 each 12  . loratadine (CLARITIN) 10 MG tablet TAKE 1 TABLET BY MOUTH EVERY DAY (Patient taking differently: Take 10 mg by mouth daily. ) 30 tablet 11  . Melatonin 3 MG TABS Take 3 mg by mouth at bedtime.    . metFORMIN (GLUCOPHAGE-XR) 500 MG 24 hr tablet TAKE 2 TABLETS BY MOUTH TWICE DAILY BEFORE MEAL 360 tablet 0  . Multiple Vitamins-Minerals (VISION FORMULA) TABS Take 1 tablet by mouth daily.     [name] OVER THE COUNTER MEDICATION Take 1 tablet by mouth daily. Equate Vision Formula 50+     . sertraline (ZOLOFT) 50 MG tablet TAKE 1 TABLET BY MOUTH EVERY DAY (Patient taking differently: Take 50 mg by mouth daily. ) 90 tablet 2  . TRUEPLUS LANCETS 30G MISC Use one lancet each time sugars are tested. Pt tests twice daily. Dx. E11.9 100 each 3  . umeclidinium-vilanterol (ANORO ELLIPTA) 62.5-25 MCG/INH AEPB Inhale 1 puff into the lungs daily. 1 each 0   No current facility-administered medications for this visit.    Allergies:   Patient has no known allergies.    Social History:  The patient  reports that she has never smoked. She has never used smokeless tobacco. She reports that she does not drink alcohol or use drugs.   Family History:  The patient's family history includes ADD / ADHD in her mother; Alcohol abuse in her mother; Dementia in her mother. She was adopted.    ROS: All other systems are reviewed and negative. Unless otherwise mentioned in H&P    PHYSICAL EXAM: VS:  BP (!) 173/79   Pulse (!) 58   Ht 5' (1.524 m)   Wt 191 lb (86.6 kg)   LMP 06/25/2008   SpO2 98%   BMI 37.30 kg/m  , BMI Body mass index is 37.3 kg/m. GEN: Well nourished, well developed, in no acute distress HEENT: normal Neck: no JVD, carotid bruits, or masses Cardiac: RRR; no murmurs, rubs, or gallops,no edema  Respiratory:  Clear to auscultation bilaterally, normal work of breathing GI: soft, nontender, nondistended, + BS MS: no deformity or  atrophy Skin: warm and dry, no rash Neuro:  Strength and sensation are intact Psych: euthymic mood, full affect   EKG: Not completed this office visi  Recent Labs: 03/03/2019: B Natriuretic Peptide 50.8 05/25/2019: ALT 16; BUN 45; Creatinine, Ser 1.15; Hemoglobin 12.6; Platelets 272.0; Potassium 4.6; Sodium 138; TSH 1.29   Lipid Panel    Component Value Date/Time   CHOL 141 05/25/2019 1352   TRIG 172.0 (H) 05/25/2019 1352   HDL 45.40 05/25/2019 1352   CHOLHDL 3 05/25/2019 1352   VLDL 34.4 05/25/2019 1352   LDLCALC 61 05/25/2019  1352   LDLDIRECT 55.0 10/21/2017 1503      Wt Readings from Last 3 Encounters:  08/10/19 191 lb (86.6 kg)  06/15/19 199 lb 6.4 oz (90.4 kg)  05/29/19 200 lb 8 oz (90.9 kg)      Other studies Reviewed: Myoview 02/02/19: Study Highlights    Nuclear stress EF: 54%.  There was no ST segment deviation noted during stress.  This is a low risk study.  The left ventricular ejection fraction is normal (55-65%).  The study is normal.  Low risk stress nuclear study with normal perfusion and normal left ventricular regional and global systolic function.    Echo 03/06/19: IMPRESSIONS 1. The left ventricle has hyperdynamic systolic function, with an ejection fraction of >65%. The cavity size was normal. There is moderate concentric left ventricular hypertrophy. Left ventricular diastolic Doppler parameters are consistent with  impaired relaxation. Elevated mean left atrial pressure No evidence of left ventricular regional wall motion abnormalities. 2. The right ventricle has normal systolic function. The cavity was normal. There is no increase in right ventricular wall thickness. Right ventricular systolic pressure could not be assessed. 3. Left atrial size was mildly dilated. 4. Trivial pericardial effusion is present. 5. The aorta is normal unless otherwise noted.  ASSESSMENT AND PLAN:  1.  Hypertension: Blood pressure is elevated this  office visit at 173/79 but I retook it in the clinic room and it was 158/68.  I have asked her to take her blood pressures daily at the same time every day and record it.  If her blood pressure is greater then 161 systolic around 1 PM she is to take an additional half dose of carvedilol (3.125  mg).  If she is consistently having to take extra dose of carvedilol will go up on her carvedilol to 12.5 mg twice daily.  2.  Chronic diastolic heart failure: She is no longer on HCTZ but is on Lasix 20 mg daily.  She has not needed to take an extra dose of Lasix for weight gain.  In fact she is losing weight, approximately 2 to 3 pounds a month.  She has lost a total of 14 pounds since November 2020.  She is becoming more active and not having any issues of dyspnea on exertion.  She is to continue daily weights and sodium restriction.  3.  Hyperlipidemia: Currently on atorvastatin 40 mg daily.  She states that her PCP is monitoring her labs.  She is to be seen in 3 months if she has not had labs at that time we will order them.   4.  Non-insulin-dependent diabetes: Currently on Metformin and followed by PCP.  Staying on diabetic diet, and losing weight.   Current medicines are reviewed at length with the patient today.  I have spent 30 minutes  dedicated to the care of this patient on the date of this encounter to include pre-visit review of records, assessment, management and diagnostic testing,with shared decision making.  Labs/ tests ordered today include: None (due to have labs by PCP next month)  Phill Myron. West Pugh, ANP, AACC   08/10/2019 4:49 PM    [facility] [place] [address] Office [phone] Fax [phone]  Notice: This dictation was prepared with Dragon dictation along with smaller phrase technology. Any transcriptional errors that result from this process are unintentional and may not be corrected upon review.

## 2019-08-10 ENCOUNTER — Other Ambulatory Visit: Payer: Self-pay

## 2019-08-10 ENCOUNTER — Ambulatory Visit (INDEPENDENT_AMBULATORY_CARE_PROVIDER_SITE_OTHER): Payer: Medicare Other | Admitting: Adult Health

## 2019-08-10 ENCOUNTER — Encounter: Payer: Self-pay | Admitting: Adult Health

## 2019-08-10 VITALS — BP 173/79 | HR 58 | Ht 60.0 in | Wt 191.0 lb

## 2019-08-10 DIAGNOSIS — I10 Essential (primary) hypertension: Secondary | ICD-10-CM

## 2019-08-10 DIAGNOSIS — N1831 Chronic kidney disease, stage 3a: Secondary | ICD-10-CM

## 2019-08-10 DIAGNOSIS — E785 Hyperlipidemia, unspecified: Secondary | ICD-10-CM

## 2019-08-10 DIAGNOSIS — E1121 Type 2 diabetes mellitus with diabetic nephropathy: Secondary | ICD-10-CM

## 2019-08-10 DIAGNOSIS — I5032 Chronic diastolic (congestive) heart failure: Secondary | ICD-10-CM

## 2019-08-10 DIAGNOSIS — E1122 Type 2 diabetes mellitus with diabetic chronic kidney disease: Secondary | ICD-10-CM

## 2019-08-10 MED ORDER — CARVEDILOL 6.25 MG PO TABS: 9.3750 mg | ORAL_TABLET | Freq: Two times a day (BID) | ORAL | 3 refills | Status: DC

## 2019-08-10 MED ORDER — CARVEDILOL 6.25 MG PO TABS: 6.2500 mg | ORAL_TABLET | Freq: Two times a day (BID) | ORAL | 3 refills | Status: DC

## 2019-08-10 NOTE — Patient Instructions (Addendum)
Medication Instructions:  Continue current medications  *If you need a refill on your cardiac medications before your next appointment, please call your pharmacy*  Lab Work: None Ordered  Testing/Procedures: None Ordered  Follow-Up: At [facility], you and your health needs are our priority.  As part of our continuing mission to provide you with exceptional heart care, we have created designated Provider Care Teams.  These Care Teams include your primary Cardiologist (physician) and Advanced Practice Providers (APPs -  Physician Assistants and Nurse Practitioners) who all work together to provide you with the care you need, when you need it.  Your next appointment:   3 month(s)  The format for your next appointment:   In Person  Provider:   Joni Reining, DNP, ANP

## 2019-08-14 ENCOUNTER — Encounter: Payer: Self-pay | Admitting: Family Medicine

## 2019-08-17 ENCOUNTER — Telehealth: Payer: Self-pay | Admitting: Adult Health

## 2019-08-17 NOTE — Telephone Encounter (Signed)
New message   B/p readings:  08/11/19 132/65 08/12/19 136/64 08/13/19 157/65: patient states that she took an extra pill on this day 08/14/19 161/69: "                                                                               " 08/15/19 145/65 08/16/19 164/67

## 2019-08-19 ENCOUNTER — Other Ambulatory Visit: Payer: Self-pay | Admitting: Family Medicine

## 2019-08-26 ENCOUNTER — Other Ambulatory Visit: Payer: Self-pay

## 2019-08-28 ENCOUNTER — Encounter: Payer: Self-pay | Admitting: Internal Medicine

## 2019-08-28 ENCOUNTER — Other Ambulatory Visit: Payer: Self-pay

## 2019-08-28 ENCOUNTER — Ambulatory Visit (INDEPENDENT_AMBULATORY_CARE_PROVIDER_SITE_OTHER): Payer: Medicare Other | Admitting: Internal Medicine

## 2019-08-28 VITALS — BP 136/78 | HR 76 | Temp 97.7°F | Ht 60.0 in | Wt 190.2 lb

## 2019-08-28 DIAGNOSIS — E1121 Type 2 diabetes mellitus with diabetic nephropathy: Secondary | ICD-10-CM

## 2019-08-28 DIAGNOSIS — N1831 Chronic kidney disease, stage 3a: Secondary | ICD-10-CM | POA: Diagnosis not present

## 2019-08-28 DIAGNOSIS — E1122 Type 2 diabetes mellitus with diabetic chronic kidney disease: Secondary | ICD-10-CM

## 2019-08-28 LAB — POCT GLYCOSYLATED HEMOGLOBIN (HGB A1C): Hemoglobin A1C: 6.7 % — AB (ref 4.0–5.6)

## 2019-08-28 MED ORDER — METFORMIN HCL ER 500 MG PO TB24: 1000.0000 mg | ORAL_TABLET | Freq: Two times a day (BID) | ORAL | 3 refills | Status: DC

## 2019-08-28 MED ORDER — GLIPIZIDE 5 MG PO TABS: ORAL_TABLET | ORAL | 3 refills | Status: DC

## 2019-08-28 NOTE — Patient Instructions (Signed)
-   keep up the Good Work!!! You have done an awesome work.   -  Glipizide 1 tablet Before breakfast and Half a tablet Before Supper   - Metformin 500 mg 2 tablets with Breakfast and 2 tablets before Supper     - HOW TO TREAT LOW BLOOD SUGARS (Blood sugar LESS THAN 70 MG/DL)  Please follow the RULE OF 15 for the treatment of hypoglycemia treatment (when your (blood sugars are less than 70 mg/dL)    STEP 1: Take 15 grams of carbohydrates when your blood sugar is low, which includes:   3-4 GLUCOSE TABS  OR  3-4 OZ OF JUICE OR REGULAR SODA OR  ONE TUBE OF GLUCOSE GEL     STEP 2: RECHECK blood sugar in 15 MINUTES STEP 3: If your blood sugar is still low at the 15 minute recheck --> then, go back to STEP 1 and treat AGAIN with another 15 grams of carbohydrates.

## 2019-08-28 NOTE — Progress Notes (Signed)
Name: Kayla Brown  Age/ Sex: 69 y.o., female   MRN/ DOB: [id number], [birth year]     PCP: [name], MD   Reason for Endocrinology Evaluation: Type 2 Diabetes Mellitus  Initial Endocrine Consultative Visit: 04/06/2019    PATIENT IDENTIFIER: Kayla Brown is a 69 y.o. female with a past medical history of HTN, CHF and T2DM . The patient has followed with Endocrinology clinic since 04/06/2019 for consultative assistance with management of her diabetes.  DIABETIC HISTORY:  Kayla Brown was diagnosed with T2DM in 2001. She was unable to start Jardiance due to cost . Actos had to be stopped due to CHF. Her hemoglobin A1c has ranged from 6.7% in 2017, peaking at 8.1% in 2020  On her initial visit to our clinic she had an A1c of 8.0%. She was on [employer] and Metformin. We switched Glipizide XL to regular Glipizide and continued metformin     SUBJECTIVE:   During the last visit (04/06/2019): A1c of 8.0%. She was on [employer] and Metformin. We switched Glipizide XL to regular Glipizide and continued metformin    Today (08/31/2019): Kayla Brown is here for a follow up on diabetes management.  She checks her blood sugars 2 times daily, preprandial to breakfast and bedtime. The patient has had hypoglycemic episodes since the last clinic visit, which occur on average 1 times a month, mostly occurring in the morning. Otherwise, the patient has not required any recent emergency interventions for hypoglycemia and has not had recent hospitalizations secondary to hyper or hypoglycemic episodes.    ROS: As per HPI and as detailed below: Review of Systems  Constitutional: Negative for chills and fever.  HENT: Negative for congestion and sore throat.   Respiratory: Negative for cough and shortness of breath.   Cardiovascular: Negative for chest pain and palpitations.  Gastrointestinal: Negative for diarrhea and nausea.      HOME DIABETES REGIMEN:   Glipizide 5 mg, 1 tablet Before  breakfast and Half a tablet Before Supper    Metformin 500 mg 2 tablets with Breakfast and 2 tablets before Supper   GLUCOSE LOG:  BG's 86- 200 mg/dL    DIABETIC COMPLICATIONS: Microvascular complications:   CKD   Denies: neuropathy , retinopathy   Last eye exam: Completed 06/2018  Macrovascular complications:   CHF  Denies: CAD, PVD, CVA  HISTORY:  Past Medical History:  Past Medical History:  Diagnosis Date  . Anxiety   . Diabetes mellitus   . Edema of extremities 03/02/2016  . Essential hypertension, benign   . Lazy eye   . Major depressive disorder, single episode, unspecified   . Obesity (BMI 30-39.9) 03/01/2016  . OSA (obstructive sleep apnea)   . Pulmonary HTN ([place])    mild with PASP 28mmHg by echo 05/2016  . Pure hypercholesterolemia   . Retinopathy, due to hypertension    Past Surgical History:  Past Surgical History:  Procedure Laterality Date  . CPAP TITRATION  10/14/2015  . DILATION AND CURETTAGE OF UTERUS  2005-2008   x2  . EYE SURGERY     cataracts  . WISDOM TOOTH EXTRACTION      Social History:  reports that she has never smoked. She has never used smokeless tobacco. She reports that she does not drink alcohol or use drugs. Family History:  Family History  Adopted: Yes  Problem Relation Age of Onset  . Alcohol abuse Mother   . ADD / ADHD Mother   . Dementia Mother   .  Breast cancer Neg Hx      HOME MEDICATIONS: Allergies as of 08/28/2019   No Known Allergies     Medication List       Accurate as of August 28, 2019 11:59 PM. If you have any questions, ask your nurse or doctor.        albuterol 108 (90 Base) MCG/ACT inhaler Commonly known as: ProAir HFA Inhale 1-2 puffs into the lungs every 6 (six) hours as needed for wheezing or shortness of breath.   Alcohol Prep Pads Pt uses an alcohol pad each time sugars are tested. Pt tests twice daily. Dx E11.9   amLODipine 10 MG tablet Commonly known as: NORVASC TAKE 1 TABLET BY MOUTH  EVERY DAY   Anoro Ellipta 62.5-25 MCG/INH Aepb Generic drug: umeclidinium-vilanterol Inhale 1 puff into the lungs daily.   atorvastatin 40 MG tablet Commonly known as: LIPITOR TAKE 1 TABLET BY MOUTH EVERY DAY   carvedilol 6.25 MG tablet Commonly known as: COREG Take 1 tablet (6.25 mg total) by mouth 2 (two) times daily. What changed: additional instructions   ciclopirox 8 % solution Commonly known as: PENLAC Apply topically at bedtime. Apply over nail and surrounding skin. Apply daily over previous coat. After seven (7) days, may remove with alcohol and continue cycle.   fish oil-omega-3 fatty acids 1000 MG capsule Take 1 g by mouth daily.   fluticasone 50 MCG/ACT nasal spray Commonly known as: FLONASE SPRAY 2 SPRAYS INTO EACH NOSTRIL EVERY DAY What changed: See the new instructions.   furosemide 20 MG tablet Commonly known as: Lasix Take 1 tablet (20 mg total) by mouth daily.   glipiZIDE 5 MG tablet Commonly known as: GLUCOTROL Take 1 tablet (5 mg total) by mouth daily before breakfast AND 0.5 tablets (2.5 mg total) daily before supper. What changed: See the new instructions. Changed by: Scarlette Shorts, MD   glucose blood test strip Commonly known as: FREESTYLE LITE USE TO TEST BLOOD GLUCOSE 2 TIMES DAILY. Dx E11.9   loratadine 10 MG tablet Commonly known as: CLARITIN TAKE 1 TABLET BY MOUTH EVERY DAY   Melatonin 3 MG Tabs Take 10 mg by mouth at bedtime.   metFORMIN 500 MG 24 hr tablet Commonly known as: GLUCOPHAGE-XR Take 2 tablets (1,000 mg total) by mouth in the morning and at bedtime. What changed: See the new instructions. Changed by: Scarlette Shorts, MD   OVER THE COUNTER MEDICATION Take 1 tablet by mouth daily. Equate Vision Formula 50+   sertraline 50 MG tablet Commonly known as: ZOLOFT TAKE 1 TABLET BY MOUTH EVERY DAY   TRUEplus Lancets 30G Misc Use one lancet each time sugars are tested. Pt tests twice daily. Dx. E11.9   Vision  Formula Tabs Take 1 tablet by mouth daily.   VITAMIN D PO Take 2,000 Units by mouth daily.        OBJECTIVE:   Vital Signs: BP 136/78 (BP Location: Left Arm, Patient Position: Sitting, Cuff Size: Large)   Pulse 76   Temp 97.7 F (36.5 C)   Ht 5' (1.524 m)   Wt 190 lb 3.2 oz (86.3 kg)   LMP 06/25/2008   SpO2 92%   BMI 37.15 kg/m   Wt Readings from Last 3 Encounters:  08/28/19 190 lb 3.2 oz (86.3 kg)  08/10/19 191 lb (86.6 kg)  06/15/19 199 lb 6.4 oz (90.4 kg)     Exam: General: Pt appears well and is in NAD  Neck: General: Supple without adenopathy. Thyroid:  Thyroid size normal.  No goiter or nodules appreciated. No thyroid bruit.  Lungs: Clear with good BS bilat with no rales, rhonchi, or wheezes  Heart: RRR with normal S1 and S2 and no gallops; no murmurs; no rub  Extremities: No pretibial edema.   Neuro: MS is good with appropriate affect, pt is alert and Ox3   DM foot exam: 04/03/2019  The skin of the feet is without sores or ulcerations. The pedal pulses are 2+ on right and 2+ on left. The sensation is intact to a screening 5.07, 10 gram monofilament bilaterally    DATA REVIEWED:  Lab Results  Component Value Date   HGBA1C 6.7 (A) 08/28/2019   HGBA1C 6.8 (A) 05/29/2019   HGBA1C 8.0 (H) 03/06/2019   Lab Results  Component Value Date   MICROALBUR 4.7 (H) 05/25/2019   LDLCALC 61 05/25/2019   CREATININE 1.15 05/25/2019   Lab Results  Component Value Date   MICRALBCREAT 5.8 05/25/2019     Lab Results  Component Value Date   CHOL 141 05/25/2019   HDL 45.40 05/25/2019   LDLCALC 61 05/25/2019   LDLDIRECT 55.0 10/21/2017   TRIG 172.0 (H) 05/25/2019   CHOLHDL 3 05/25/2019         ASSESSMENT / PLAN / RECOMMENDATIONS:   1) Type 2 Diabetes Mellitus, Optimally controlled, With CKD III complications - Most recent A1c of 6.7 %. Goal A1c < 7.0%.    - I have congratulated the patient on continued optimal glycemic control -She did have rare  hypoglycemic episodes, mainly occurring in the morning, we did discuss stopping the evening dose of glipizide, but the patient would like to continue on this as this is rare and she is concerned of having her glucose in the mornings if she does stop it. -Patient advised to contact us should her hypoglycemic episodes increase in frequency - No changes will be made today     MEDICATIONS:  Glipizide 5 mg, 1 tablet Before breakfast and Half a tablet Before Supper    Metformin 500 mg 2 tablets with Breakfast and 2 tablets before Supper   EDUCATION / INSTRUCTIONS:  BG monitoring instructions: Patient is instructed to check her blood sugars 1 times a day, fasting.   Call [facility] if: BG persistently < 70 or > 300. . I reviewed the Rule of 15 for the treatment of hypoglycemia in detail with the patient. Literature supplied.     F/U in 6 months    Signed electronically by: Lyndle Herrlich, MD  [facility] Endocrinology  [facility] [address] [place] [place], [place] [address] Phone: [phone] FAX: [phone]   CC: Sheliah Hatch, MD [address] [place] [address] Phone: [phone]  Fax: [phone]  Return to Endocrinology clinic as below: Future Appointments  Date Time Provider Department Center  09/08/2019 10:00 AM Alejandro Mulling, RN [facility] None  11/09/2019  1:45 PM Jodelle Gross, NP [facility]  11/19/2019  2:00 PM Sheliah Hatch, MD [facility]  12/14/2019  2:30 PM Waymon Budge, MD [facility] None  03/04/2020  1:40 PM Kaelah Hayashi, Konrad Dolores, MD [facility] None

## 2019-08-31 ENCOUNTER — Other Ambulatory Visit: Payer: Self-pay

## 2019-08-31 ENCOUNTER — Telehealth: Payer: Self-pay | Admitting: Internal Medicine

## 2019-08-31 ENCOUNTER — Encounter: Payer: Self-pay | Admitting: Internal Medicine

## 2019-08-31 MED ORDER — UNABLE TO FIND: 0 refills | Status: DC

## 2019-08-31 NOTE — Telephone Encounter (Signed)
Thank you, added to pt chart.

## 2019-08-31 NOTE — Telephone Encounter (Signed)
Patient stated that Dr Lonzo Cloud during her last visit asked her to call her back and advise which meter she is using

## 2019-08-31 NOTE — Telephone Encounter (Signed)
Not sure if this is just an FYI or is pt was asking for refills.

## 2019-08-31 NOTE — Telephone Encounter (Signed)
Patient called to advise that her blood sugar meter brand is [employer] Meter, it talks, and she go the meter in late December 2020

## 2019-09-08 ENCOUNTER — Other Ambulatory Visit: Payer: Self-pay | Admitting: *Deleted

## 2019-09-08 NOTE — Patient Outreach (Signed)
[facility]) Care Management  09/08/2019  Kayla Brown 1951/06/24 [id number]    Telephone Assessment-Successful-HF  RN spoke with pt today with update on her ongoing management of care. Pt reports she has established a new endocrinologist and reports her A1c remains at 6.8 with no additional problems. Pt states she will follow up in May with her primary provider. Today reports a weight of 185.5 lbs, yesterday 185.4 lbs and last week 185.4 lbs with no swelling or related issues since our last conversation in Dec. Pt has permission from her providers to take an extra Lasix if needed with a weight gain of 3 lbs overnight or 5 lbs within one week and call to office if she continue to gain more weight. RN has verified pt remains in the GREEN zone today with no precipitating symptoms. The only reported issue pt is working on at this time is her blood pressure. States her BP is slightly elevated and her provider has requested pt take 1/2 extra of her BP medications if needed. Pt has verified all appointments over the next 6 months with her providers and has sufficient transportation with no problems. Pt feels good about managing her care and documents all instructions for her memory.   RN discussed all goals and interventions including goal met over the past several months based upon pt's progress in managing her ongoing care. Interventions updated accordingly and RN will follow up in 3 months. Discussed possible referral to [place] coach if pt continue to do well in managing her ongoing care (pt receptive).  Will continue to update her provider with her disposition with [facility] services and alert pt to contact this RN case manager with any request for needed resources as she continue to manager her care.  [facility] CM Care Plan Problem One     Most Recent Value  Care Plan Problem One  Knowledge Deficit realted to CHF exacerbation  Role Documenting the Problem One  Care Management Coordinator   Care Plan for Problem One  Active  [facility]N Long Term Goal   Pt will verbalize two symptoms of CHF exacerbation within the next 90 days.  [facility] Long Term Goal Start Date  03/27/19  Interventions for Problem One Long Term Goal  Will verify pt able to recite symptoms and aware of what to do. Pt continue to work with her providers on interventions. Will extend to allow ongoing adherence with her managing her ongoing care and re-evaluate on the next follow up for possible transfer to a health coach with [facility].      [name], RN Care Management Coordinator [place] Office [phone]

## 2019-09-29 ENCOUNTER — Other Ambulatory Visit: Payer: Self-pay | Admitting: Family Medicine

## 2019-09-30 ENCOUNTER — Telehealth: Payer: Self-pay | Admitting: Family Medicine

## 2019-09-30 NOTE — Progress Notes (Signed)
  Chronic Care Management   Outreach Note  09/30/2019 Name: Kayla Brown MRN: [id number] DOB: 01-26-1951  Referred by: Sheliah Hatch, MD Reason for referral : No chief complaint on file.   An unsuccessful telephone outreach was attempted today. The patient was referred to the pharmacist for assistance with care management and care coordination.   Follow Up Plan:   Lynnae January Upstream Scheduler

## 2019-10-02 ENCOUNTER — Telehealth: Payer: Self-pay | Admitting: Family Medicine

## 2019-10-02 NOTE — Progress Notes (Signed)
  Chronic Care Management   Note  10/02/2019 Name: Jessel Gettinger MRN: [id number] DOB: [birth year]  Khyler Urda is a 69 y.o. year old female who is a primary care patient of Beverely Low, Helane Rima, MD. I reached out to [name] by phone today in response to a referral sent by Ms. Aubreyanna Saidi's PCP, Sheliah Hatch, MD.   Ms. Giacobbe was given information about Chronic Care Management services today including:  1. CCM service includes personalized support from designated clinical staff supervised by her physician, including individualized plan of care and coordination with other care providers 2. 24/7 contact phone numbers for assistance for urgent and routine care needs. 3. Service will only be billed when office clinical staff spend 20 minutes or more in a month to coordinate care. 4. Only one practitioner may furnish and bill the service in a calendar month. 5. The patient may stop CCM services at any time (effective at the end of the month) by phone call to the office staff.   Patient agreed to services and verbal consent obtained.   Follow up plan:   Lynnae January Upstream Scheduler

## 2019-10-07 ENCOUNTER — Other Ambulatory Visit: Payer: Self-pay | Admitting: *Deleted

## 2019-10-12 DIAGNOSIS — Z23 Encounter for immunization: Secondary | ICD-10-CM | POA: Diagnosis not present

## 2019-10-13 ENCOUNTER — Other Ambulatory Visit: Payer: Self-pay | Admitting: General Practice

## 2019-10-13 DIAGNOSIS — I10 Essential (primary) hypertension: Secondary | ICD-10-CM

## 2019-10-13 DIAGNOSIS — E1122 Type 2 diabetes mellitus with diabetic chronic kidney disease: Secondary | ICD-10-CM

## 2019-10-13 DIAGNOSIS — E785 Hyperlipidemia, unspecified: Secondary | ICD-10-CM

## 2019-10-13 DIAGNOSIS — N1832 Chronic kidney disease, stage 3b: Secondary | ICD-10-CM

## 2019-10-22 ENCOUNTER — Other Ambulatory Visit: Payer: Self-pay

## 2019-10-22 ENCOUNTER — Telehealth: Payer: Medicare Other

## 2019-10-23 ENCOUNTER — Ambulatory Visit: Payer: Medicare Other

## 2019-10-23 DIAGNOSIS — E119 Type 2 diabetes mellitus without complications: Secondary | ICD-10-CM

## 2019-10-23 DIAGNOSIS — I10 Essential (primary) hypertension: Secondary | ICD-10-CM

## 2019-10-23 DIAGNOSIS — E785 Hyperlipidemia, unspecified: Secondary | ICD-10-CM

## 2019-10-23 DIAGNOSIS — I5033 Acute on chronic diastolic (congestive) heart failure: Secondary | ICD-10-CM

## 2019-10-23 NOTE — Patient Instructions (Signed)
Visit Information  Goals Addressed            This Visit's Progress   . A1c <7%       CARE PLAN ENTRY (see longitudinal plan of care for additional care plan information)  Current Barriers:  . Diabetes: type 2 Lab Results  Component Value Date   HGBA1C 6.7 (A) 08/28/2019   Lab Results  Component Value Date   CREATININE 1.15 05/25/2019   CREATININE 1.19 04/03/2019   CREATININE 1.27 (H) 03/27/2019 .  Current antihyperglycemic regimen:  Metformin XL 500 mg two tabs (1000 mg) twice daily  Glipizide IR 5 mg every morning before breakfast, and 2.5 mg before supper   Pharmacist Clinical Goal(s):  [name] Over the next 180 days, patient will work with PharmD and primary care provider as needed to maintain A1c<7%  Interventions: . Comprehensive medication review performed, medication list updated in electronic medical record . Inter-disciplinary care team collaboration (see longitudinal plan of care)  Patient Self Care Activities:  . Patient will check blood glucose every morning, document, and provide at future appointments . Patient will focus on medication adherence by continuing current medication management  . Patient will take medications as prescribed . Patient will contact provider with any episodes of hypoglycemia . Patient will report any questions or concerns to provider   Initial goal documentation.     . BP <140/90       CARE PLAN ENTRY (see longitudinal plan of care for additional care plan information)  Current Barriers:  . Uncontrolled hypertension, complicated by Chronic Kidney Disease, Diabetes, Heart Failure . Current antihypertensive regimen: amlodipine 10 mg, carvedilol 6.25 mg tabs as 1.5 tabs twice daily, furosemide 20 mg daily . Previously on RAASi therapy with spironolactone and losartan but was stopped after developing AKI.  [name] Last practice recorded BP readings:  BP Readings from Last 3 Encounters:  08/28/19 136/78  08/10/19 (!) 173/79  06/15/19  122/68 .  Current home BP readings:   Morning (4/26-4/29): 135-141/57-67  Afternoon (4/14-4/16): 148-164/65-71  Pharmacist Clinical Goal(s):  [name] Over the next 180 days, patient will work with PharmD and providers to optimize antihypertensive regimen.  Interventions: . Inter-disciplinary care team collaboration (see longitudinal plan of care). . Comprehensive medication review performed; medication list updated in the electronic medical record.  . Monitor BP daily at 1pm unless instructed otherwise at May cardiology appointment.   Patient Self Care Activities:  . Patient will continue to check BP daily, document, and provide at future appointments. . Patient will focus on medication adherence by continuing with current medication management. . Consider pharmacy services we discussed to help with medication organization through pill packaging and medication delivery. . Continue daily weights for heart failure.   Initial goal documentation     . LDL <70       CARE PLAN ENTRY (see longitudinal plan of care for additional care plan information)  Current Barriers:  . Current antihyperlipidemic regimen: atorvastatin 40 mg daily, fish oil 1000 mg daily  . Most recent lipid panel:     Component Value Date/Time   CHOL 141 05/25/2019 1352   TRIG 172.0 (H) 05/25/2019 1352   HDL 45.40 05/25/2019 1352   CHOLHDL 3 05/25/2019 1352   VLDL 34.4 05/25/2019 1352   LDLCALC 61 05/25/2019 1352   LDLDIRECT 55.0 10/21/2017 1503   . ASCVD risk enhancing conditions: age >42, DM, HTN, CKD, CHF  Pharmacist Clinical Goal(s):  [name] Over the next 180 days, patient will work with PharmD  and providers towards optimized antihyperlipidemic therapy  Interventions: . Comprehensive medication review performed; medication list updated in electronic medical record.  Rene Paci care team collaboration (see longitudinal plan of care)  Patient Self Care Activities:  . Patient will focus on medication  adherence by continuing current medication management.   Initial goal documentation.       Ms. Vanzile was given information about Chronic Care Management services today including:  1. CCM service includes personalized support from designated clinical staff supervised by her physician, including individualized plan of care and coordination with other care providers 2. 24/7 contact phone numbers for assistance for urgent and routine care needs. 3. Standard insurance, coinsurance, copays and deductibles apply for chronic care management only during months in which we provide at least 20 minutes of these services. Most insurances cover these services at 100%, however patients may be responsible for any copay, coinsurance and/or deductible if applicable. This service may help you avoid the need for more expensive face-to-face services. 4. Only one practitioner may furnish and bill the service in a calendar month. 5. The patient may stop CCM services at any time (effective at the end of the month) by phone call to the office staff.  Patient agreed to services and verbal consent obtained.   The patient verbalized understanding of instructions provided today and agreed to receive a mailed copy of patient instruction and/or educational materials. Telephone follow up appointment with pharmacy team member scheduled for: See next appointment with "Care Management Staff" under "What's Next" below.   Thank you!  Dahlia Byes, Pharm.D. Clinical Pharmacist [facility] [phone] Hypertension, Adult High blood pressure (hypertension) is when the force of blood pumping through the arteries is too strong. The arteries are the blood vessels that carry blood from the heart throughout the body. Hypertension forces the heart to work harder to pump blood and may cause arteries to become narrow or stiff. Untreated or uncontrolled hypertension can cause a heart attack, heart  failure, a stroke, kidney disease, and other problems. A blood pressure reading consists of a higher number over a lower number. Ideally, your blood pressure should be below 120/80. The first ("top") number is called the systolic pressure. It is a measure of the pressure in your arteries as your heart beats. The second ("bottom") number is called the diastolic pressure. It is a measure of the pressure in your arteries as the heart relaxes. What are the causes? The exact cause of this condition is not known. There are some conditions that result in or are related to high blood pressure. What increases the risk? Some risk factors for high blood pressure are under your control. The following factors may make you more likely to develop this condition:  Smoking.  Having type 2 diabetes mellitus, high cholesterol, or both.  Not getting enough exercise or physical activity.  Being overweight.  Having too much fat, sugar, calories, or salt (sodium) in your diet.  Drinking too much alcohol. Some risk factors for high blood pressure may be difficult or impossible to change. Some of these factors include:  Having chronic kidney disease.  Having a family history of high blood pressure.  Age. Risk increases with age.  Race. You may be at higher risk if you are African American.  Gender. Men are at higher risk than women before age 9. After age 56, women are at higher risk than men.  Having obstructive sleep apnea.  Stress. What are the signs or  symptoms? High blood pressure may not cause symptoms. Very high blood pressure (hypertensive crisis) may cause:  Headache.  Anxiety.  Shortness of breath.  Nosebleed.  Nausea and vomiting.  Vision changes.  Severe chest pain.  Seizures. How is this diagnosed? This condition is diagnosed by measuring your blood pressure while you are seated, with your arm resting on a flat surface, your legs uncrossed, and your feet flat on the floor.  The cuff of the blood pressure monitor will be placed directly against the skin of your upper arm at the level of your heart. It should be measured at least twice using the same arm. Certain conditions can cause a difference in blood pressure between your right and left arms. Certain factors can cause blood pressure readings to be lower or higher than normal for a short period of time:  When your blood pressure is higher when you are in a health care provider's office than when you are at home, this is called white coat hypertension. Most people with this condition do not need medicines.  When your blood pressure is higher at home than when you are in a health care provider's office, this is called masked hypertension. Most people with this condition may need medicines to control blood pressure. If you have a high blood pressure reading during one visit or you have normal blood pressure with other risk factors, you may be asked to:  Return on a different day to have your blood pressure checked again.  Monitor your blood pressure at home for 1 week or longer. If you are diagnosed with hypertension, you may have other blood or imaging tests to help your health care provider understand your overall risk for other conditions. How is this treated? This condition is treated by making healthy lifestyle changes, such as eating healthy foods, exercising more, and reducing your alcohol intake. Your health care provider may prescribe medicine if lifestyle changes are not enough to get your blood pressure under control, and if:  Your systolic blood pressure is above 130.  Your diastolic blood pressure is above 80. Your personal target blood pressure may vary depending on your medical conditions, your age, and other factors. Follow these instructions at home: Eating and drinking   Eat a diet that is high in fiber and potassium, and low in sodium, added sugar, and fat. An example eating plan is called the  DASH (Dietary Approaches to Stop Hypertension) diet. To eat this way: ? Eat plenty of fresh fruits and vegetables. Try to fill one half of your plate at each meal with fruits and vegetables. ? Eat whole grains, such as whole-wheat pasta, brown rice, or whole-grain bread. Fill about one fourth of your plate with whole grains. ? Eat or drink low-fat dairy products, such as skim milk or low-fat yogurt. ? Avoid fatty cuts of meat, processed or cured meats, and poultry with skin. Fill about one fourth of your plate with lean proteins, such as fish, chicken without skin, beans, eggs, or tofu. ? Avoid pre-made and processed foods. These tend to be higher in sodium, added sugar, and fat.  Reduce your daily sodium intake. Most people with hypertension should eat less than 1,500 mg of sodium a day.  Do not drink alcohol if: ? Your health care provider tells you not to drink. ? You are pregnant, may be pregnant, or are planning to become pregnant.  If you drink alcohol: ? Limit how much you use to:  0-1 drink a day  for women.  0-2 drinks a day for men. ? Be aware of how much alcohol is in your drink. In the [place] one drink equals one 12 oz bottle of beer (355 mL), one 5 oz glass of wine (148 mL), or one 1 oz glass of hard liquor (44 mL). Lifestyle   Work with your health care provider to maintain a healthy body weight or to lose weight. Ask what an ideal weight is for you.  Get at least 30 minutes of exercise most days of the week. Activities may include walking, swimming, or biking.  Include exercise to strengthen your muscles (resistance exercise), such as Pilates or lifting weights, as part of your weekly exercise routine. Try to do these types of exercises for 30 minutes at least 3 days a week.  Do not use any products that contain nicotine or tobacco, such as cigarettes, e-cigarettes, and chewing tobacco. If you need help quitting, ask your health care provider.  Monitor your blood pressure  at home as told by your health care provider.  Keep all follow-up visits as told by your health care provider. This is important. Medicines  Take over-the-counter and prescription medicines only as told by your health care provider. Follow directions carefully. Blood pressure medicines must be taken as prescribed.  Do not skip doses of blood pressure medicine. Doing this puts you at risk for problems and can make the medicine less effective.  Ask your health care provider about side effects or reactions to medicines that you should watch for. Contact a health care provider if you:  Think you are having a reaction to a medicine you are taking.  Have headaches that keep coming back (recurring).  Feel dizzy.  Have swelling in your ankles.  Have trouble with your vision. Get help right away if you:  Develop a severe headache or confusion.  Have unusual weakness or numbness.  Feel faint.  Have severe pain in your chest or abdomen.  Vomit repeatedly.  Have trouble breathing. Summary  Hypertension is when the force of blood pumping through your arteries is too strong. If this condition is not controlled, it may put you at risk for serious complications.  Your personal target blood pressure may vary depending on your medical conditions, your age, and other factors. For most people, a normal blood pressure is less than 120/80.  Hypertension is treated with lifestyle changes, medicines, or a combination of both. Lifestyle changes include losing weight, eating a healthy, low-sodium diet, exercising more, and limiting alcohol. This information is not intended to replace advice given to you by your health care provider. Make sure you discuss any questions you have with your health care provider. Document Revised: 02/19/2018 Document Reviewed: 02/19/2018 Elsevier Patient Education  2020 [employer].

## 2019-10-23 NOTE — Progress Notes (Signed)
Chronic Care Management Pharmacy  Name: Kayla Brown  MRN: [id number] DOB: 1951/04/18  Chief Complaint/HPI  Kayla Brown,  69 y.o. , female presents for their Initial CCM visit with the clinical pharmacist via telephone due to COVID-19 Pandemic.  PCP : Sheliah Hatch, MD  Their chronic conditions include: Hypertension, Hyperlipidemia, Diabetes, Heart Failure and CKD.  Office Visits:  11/30 (PCP): 4 lbs weight loss since last OV. Consult Visits:  3/5 (endo, Dr. Lonzo Cloud): hypoglycemic episodes avg 1x/month.   2/15 (cardio, Joni Reining): bp elevated at visit 173/79, on repeat 158/68. Home monitoring every day at 1 pm. Needs labs.   Outpatient Encounter Medications as of 10/23/2019  Medication Sig  . albuterol (PROAIR HFA) 108 (90 Base) MCG/ACT inhaler Inhale 1-2 puffs into the lungs every 6 (six) hours as needed for wheezing or shortness of breath.  . Alcohol Swabs (ALCOHOL PREP) PADS Pt uses an alcohol pad each time sugars are tested. Pt tests twice daily. Dx E11.9  . amLODipine (NORVASC) 10 MG tablet TAKE 1 TABLET BY MOUTH EVERY DAY  . atorvastatin (LIPITOR) 40 MG tablet TAKE 1 TABLET BY MOUTH EVERY DAY  . carvedilol (COREG) 6.25 MG tablet Take 1 tablet (6.25 mg total) by mouth 2 (two) times daily. (Patient taking differently: Take 6.25 mg by mouth 2 (two) times daily. Extra half a tab only when elevated)  . Cholecalciferol (VITAMIN D PO) Take 2,000 Units by mouth daily.   . fish oil-omega-3 fatty acids 1000 MG capsule Take 1 g by mouth daily.  . fluticasone (FLONASE) 50 MCG/ACT nasal spray SPRAY 2 SPRAYS INTO EACH NOSTRIL EVERY DAY (Patient taking differently: Place 2 sprays into both nostrils daily. )  . furosemide (LASIX) 20 MG tablet Take 1 tablet (20 mg total) by mouth daily.  [name] glipiZIDE (GLUCOTROL) 5 MG tablet Take 1 tablet (5 mg total) by mouth daily before breakfast AND 0.5 tablets (2.5 mg total) daily before supper.  [name] glucose blood (FREESTYLE LITE) test  strip USE TO TEST BLOOD GLUCOSE 2 TIMES DAILY. Dx E11.9  . loratadine (CLARITIN) 10 MG tablet TAKE 1 TABLET BY MOUTH EVERY DAY (Patient taking differently: Take 10 mg by mouth daily. )  . Melatonin 3 MG TABS Take 10 mg by mouth at bedtime.   . metFORMIN (GLUCOPHAGE-XR) 500 MG 24 hr tablet Take 2 tablets (1,000 mg total) by mouth in the morning and at bedtime.  . Multiple Vitamins-Minerals (VISION FORMULA) TABS Take 1 tablet by mouth daily.   [name] OVER THE COUNTER MEDICATION Take 1 tablet by mouth daily. Equate Vision Formula 50+   . sertraline (ZOLOFT) 50 MG tablet TAKE 1 TABLET BY MOUTH EVERY DAY  . TRUEPLUS LANCETS 30G MISC Use one lancet each time sugars are tested. Pt tests twice daily. Dx. E11.9  . ciclopirox (PENLAC) 8 % solution Apply topically at bedtime. Apply over nail and surrounding skin. Apply daily over previous coat. After seven (7) days, may remove with alcohol and continue cycle. (Patient not taking: Reported on 10/23/2019)  . umeclidinium-vilanterol (ANORO ELLIPTA) 62.5-25 MCG/INH AEPB Inhale 1 puff into the lungs daily. (Patient not taking: Reported on 10/23/2019)  . UNABLE TO FIND Med Name: Healthwise talking meter   No facility-administered encounter medications on file as of 10/23/2019.   Current Diagnosis/Assessment: Goals Addressed            This Visit's Progress   . A1c <7%       CARE PLAN ENTRY (see longitudinal plan of care for additional  care plan information)  Current Barriers:  . Diabetes: type 2 Lab Results  Component Value Date   HGBA1C 6.7 (A) 08/28/2019   Lab Results  Component Value Date   CREATININE 1.15 05/25/2019   CREATININE 1.19 04/03/2019   CREATININE 1.27 (H) 03/27/2019 .  Current antihyperglycemic regimen:  Metformin XL 500 mg two tabs (1000 mg) twice daily  Glipizide IR 5 mg every morning before breakfast, and 2.5 mg before supper   Pharmacist Clinical Goal(s):  [name] Over the next 180 days, patient will work with PharmD and primary care  provider as needed to maintain A1c<7%  Interventions: . Comprehensive medication review performed, medication list updated in electronic medical record . Inter-disciplinary care team collaboration (see longitudinal plan of care)  Patient Self Care Activities:  . Patient will check blood glucose every morning, document, and provide at future appointments . Patient will focus on medication adherence by continuing current medication management  . Patient will take medications as prescribed . Patient will contact provider with any episodes of hypoglycemia . Patient will report any questions or concerns to provider   Initial goal documentation.     . BP <140/90       CARE PLAN ENTRY (see longitudinal plan of care for additional care plan information)  Current Barriers:  . Uncontrolled hypertension, complicated by Chronic Kidney Disease, Diabetes, Heart Failure . Current antihypertensive regimen: amlodipine 10 mg, carvedilol 6.25 mg tabs as 1.5 tabs twice daily, furosemide 20 mg daily . Previously on RAASi therapy with spironolactone and losartan but was stopped after developing AKI.  [name] Last practice recorded BP readings:  BP Readings from Last 3 Encounters:  08/28/19 136/78  08/10/19 (!) 173/79  06/15/19 122/68 .  Current home BP readings:   Morning (4/26-4/29): 135-141/57-67  Afternoon (4/14-4/16): 148-164/65-71  Pharmacist Clinical Goal(s):  [name] Over the next 180 days, patient will work with PharmD and providers to optimize antihypertensive regimen.  Interventions: . Inter-disciplinary care team collaboration (see longitudinal plan of care). . Comprehensive medication review performed; medication list updated in the electronic medical record.  . Monitor BP daily at 1pm unless instructed otherwise at May cardiology appointment.   Patient Self Care Activities:  . Patient will continue to check BP daily, document, and provide at future appointments. . Patient will focus on  medication adherence by continuing with current medication management. . Consider pharmacy services we discussed to help with medication organization through pill packaging and medication delivery. . Continue daily weights for heart failure.   Initial goal documentation     . LDL <70       CARE PLAN ENTRY (see longitudinal plan of care for additional care plan information)  Current Barriers:  . Current antihyperlipidemic regimen: atorvastatin 40 mg daily, fish oil 1000 mg daily  . Most recent lipid panel:     Component Value Date/Time   CHOL 141 05/25/2019 1352   TRIG 172.0 (H) 05/25/2019 1352   HDL 45.40 05/25/2019 1352   CHOLHDL 3 05/25/2019 1352   VLDL 34.4 05/25/2019 1352   LDLCALC 61 05/25/2019 1352   LDLDIRECT 55.0 10/21/2017 1503   . ASCVD risk enhancing conditions: age >73, DM, HTN, CKD, CHF  Pharmacist Clinical Goal(s):  [name] Over the next 180 days, patient will work with PharmD and providers towards optimized antihyperlipidemic therapy  Interventions: . Comprehensive medication review performed; medication list updated in electronic medical record.  Bertram Savin care team collaboration (see longitudinal plan of care)  Patient Self Care Activities:  . Patient  will focus on medication adherence by continuing current medication management.   Initial goal documentation.       Hypertension / HF   BP today is:  <140/90  BP Readings from Last 3 Encounters:  08/28/19 136/78  08/10/19 (!) 173/79  06/15/19 122/68   Patient has failed these meds in the past: amlodipine 10 mg, carvedilol 6.25 mg tabs as 1.5 tabs twice daily, furosemide 20 mg daily. Patient has been checking home BP daily. Note readings below. Previously on RAAS therapy with spironolactone and losartan but was stopped after developing AKI.   Patient Reported Home BPs today  Morning (4/26-4/29): 135-141/57-67  Afternoon (4/14-4/16): 148-164/65-71  Instruction from cardio to monitor once  daily 1pm. Mentions that she has been checking more frequently in the mornings because husband is there to help then. Patient will arrange to have afternoon readings before her f/u with cardiology. Denies dizziness, SOB, chest pain.  Performs daily weights, expresses understanding of when to take additional furosemide or seek care. Has needed to take additional furosemide 20 mg since last cardio OV.   Plan Continue current medications. Start testing home BP at 1 pm.    Diabetes   Recent Relevant Labs: Lab Results  Component Value Date/Time   HGBA1C 6.7 (A) 08/28/2019 02:17 PM   HGBA1C 6.8 (A) 05/29/2019 02:06 PM   HGBA1C 8.0 (H) 03/06/2019 04:40 AM   HGBA1C 7.8 (H) 03/04/2019 05:00 AM   HGBA1C 6.7 02/06/2016 12:00 AM   MICROALBUR 4.7 (H) 05/25/2019 01:52 PM    Checking BG: every morning - fasting. Patient reported readings:  4/25-4/29: 136, 131, 111, 119, 118  Hx if low BG ~1x/month. Denies increased frequency or happening within last week. Previously on Actos (stopped-HF), Glipizide XL (stopped-minimize hypoglycemia), RAASi hx (stopped-AKI).  Patient is currently controlled on the following medications:   Metformin XL 500 mg two tabs (1000 mg) twice daily  Glipizide IR 5 mg every morning before breakfast, and 2.5 mg before supper   Last diabetic eye exam:  Lab Results  Component Value Date/Time   HMDIABEYEEXA No Retinopathy 07/28/2019 12:00 AM    Last diabetic foot exam:  Lab Results  Component Value Date/Time   HMDIABFOOTEX completed 01/24/2016 12:00 AM    Plan Continue current medications.   Hyperlipidemia   Lipid Panel     Component Value Date/Time   CHOL 141 05/25/2019 1352   TRIG 172.0 (H) 05/25/2019 1352   HDL 45.40 05/25/2019 1352   CHOLHDL 3 05/25/2019 1352   VLDL 34.4 05/25/2019 1352   LDLCALC 61 05/25/2019 1352   LDLDIRECT 55.0 10/21/2017 1503   The 10-year ASCVD risk score Denman George DC Jr., et al., 2013) is: 17.5%   Values used to calculate the  score:     Age: 61 years     Sex: Female     Is Non-Hispanic African American: No     Diabetic: Yes     Tobacco smoker: No     Systolic Blood Pressure: 136 mmHg     Is BP treated: Yes     HDL Cholesterol: 45.4 mg/dL     Total Cholesterol: 141 mg/dL   41/7408 labs - TG mildly elevated, LDL <70. Upcoming labs 10/2019. Taking atorvastatin 40 mg daily, fish oil 1000 mg daily. We discussed:  diet and exercise extensively. Mentions losing ~3 lbs with physical therapy.  Plan Continue current medications and control with diet and exercise.  Depression  Patient is currently controlled on sertraline  50 mg daily.  Reports  continued benefit with current regimen. Denies any side effects at this time.  Plan Continue current medication.   Vaccines  Reviewed and discussed patient's vaccination history.  Due for Shingrix, TDAP. Cost may be prohibitive.  Immunization History  Administered Date(s) Administered  . Fluad Quad(high Dose 65+) 02/16/2019  . Influenza,inj,Quad PF,6+ Mos 04/24/2016, 03/04/2017, 03/20/2018  . Pneumococcal Conjugate-13 07/06/2016  . Pneumococcal Polysaccharide-23 07/01/2017  . Tdap 09/23/2009   Plan Recommended patient receive Shingrix vaccine in office/pharmacy. Will review PAP for vaccines.   Medication Management  Pt uses [facility] for all medications Uses pill box? Yes.  Plan Continue current medication management strategy.  Follow up: 1 month phone visit ______________ Visit Information SDOH (Social Determinants of Health) assessments performed: Yes.   Food Insecurity: No Food Insecurity  . Worried About Programme researcher, broadcasting/film/video in the Last Year: Never true  . Ran Out of Food in the Last Year: Never true     Transportation Needs: No Transportation Needs  . Lack of Transportation (Medical): No  . Lack of Transportation (Non-Medical): No   Ms. Duling was given information about Chronic Care Management services today including:  1. CCM service includes  personalized support from designated clinical staff supervised by her physician, including individualized plan of care and coordination with other care providers 2. 24/7 contact phone numbers for assistance for urgent and routine care needs. 3. Standard insurance, coinsurance, copays and deductibles apply for chronic care management only during months in which we provide at least 20 minutes of these services. Most insurances cover these services at 100%, however patients may be responsible for any copay, coinsurance and/or deductible if applicable. This service may help you avoid the need for more expensive face-to-face services. 4. Only one practitioner may furnish and bill the service in a calendar month. 5. The patient may stop CCM services at any time (effective at the end of the month) by phone call to the office staff.  Patient agreed to services and verbal consent obtained.   Dahlia Byes, Pharm.D. Clinical Pharmacist [facility] [phone]

## 2019-11-05 NOTE — Progress Notes (Signed)
Cardiology Office Note   Date:  11/09/2019   ID:  Kayla Brown, DOB Apr 18, 1951, MRN [id number]  PCP:  [name], MD  Cardiologist:  Dr.Jordan  CC; Follow up   History of Present Illness: Kayla Brown is a 69 y.o. female who presents for for ongoing assessment and management of chronic dyspnea with history of OSA formally managed by Dr. Radford Pax, pulmonary hypertension, based on echocardiogram, but repeat echocardiogram in 2019 in 2020 did not reveal elevated PA pressures.  She has severe OSA with an AHI of 36.9/hour and is followed by pulmonary now for management of this.  Most recent PFTs showed no airflow obstruction or restriction.  She continues on CPAP.  On the last office visit 08/10/2019 she was hypertensive. have asked her to take her blood pressures daily at the same time every day and record it.  If her blood pressure is greater then 937 systolic around 1 PM she is to take an additional half dose of carvedilol (3.125  mg).  If she is consistently having to take extra dose of carvedilol will go up on her carvedilol to 12.5 mg twice daily. Will need BMET.   She comes today doing well but is concerned that her blood pressure is not low enough.  Her blood pressures been ranging from 169-678 systolic.  She states that she has been under some stress lately as her husband had been hospitalized with Ethelene Hal.  She has lost 10 pounds and is avoiding all salt.  Blood pressure today is well controlled but does not coincide with blood pressure she is taking at home.  Blood pressures are as high as 140s in the afternoon sometimes as high as 160.  She has been taking 1-1/2 tablets of carvedilol twice daily.  She has not had to take any extra doses of Lasix has not had any complaints of lower extremity edema.  Past Medical History:  Diagnosis Date  . Anxiety   . Diabetes mellitus   . Edema of extremities 03/02/2016  . Essential hypertension, benign   . Lazy eye   . Major depressive  disorder, single episode, unspecified   . Obesity (BMI 30-39.9) 03/01/2016  . OSA (obstructive sleep apnea)   . Pulmonary HTN ([place])    mild with PASP 23mHg by echo 05/2016  . Pure hypercholesterolemia   . Retinopathy, due to hypertension     Past Surgical History:  Procedure Laterality Date  . CPAP TITRATION  10/14/2015  . DILATION AND CURETTAGE OF UTERUS  2005-2008   x2  . EYE SURGERY     cataracts  . WISDOM TOOTH EXTRACTION       Current Outpatient Medications  Medication Sig Dispense Refill  . albuterol (PROAIR HFA) 108 (90 Base) MCG/ACT inhaler Inhale 1-2 puffs into the lungs every 6 (six) hours as needed for wheezing or shortness of breath. 18 g 3  . Alcohol Swabs (ALCOHOL PREP) PADS Pt uses an alcohol pad each time sugars are tested. Pt tests twice daily. Dx E11.9 100 each 3  . amLODipine (NORVASC) 10 MG tablet TAKE 1 TABLET BY MOUTH EVERY DAY 90 tablet 1  . atorvastatin (LIPITOR) 40 MG tablet TAKE 1 TABLET BY MOUTH EVERY DAY 90 tablet 1  . carvedilol (COREG) 6.25 MG tablet Take 2 tablets in the morning and 1 1/2(9.375 mg) tablet at bedtime 315 tablet 3  . Cholecalciferol (VITAMIN D PO) Take 2,000 Units by mouth daily.     . fish oil-omega-3 fatty acids 1000 MG  capsule Take 1 g by mouth daily.    . fluticasone (FLONASE) 50 MCG/ACT nasal spray SPRAY 2 SPRAYS INTO EACH NOSTRIL EVERY DAY (Patient taking differently: Place 2 sprays into both nostrils daily. ) 18 g 1  . furosemide (LASIX) 20 MG tablet Take 1 tablet (20 mg total) by mouth daily. 90 tablet 1  . glipiZIDE (GLUCOTROL) 5 MG tablet Take 1 tablet (5 mg total) by mouth daily before breakfast AND 0.5 tablets (2.5 mg total) daily before supper. 135 tablet 3  . glucose blood (FREESTYLE LITE) test strip USE TO TEST BLOOD GLUCOSE 2 TIMES DAILY. Dx E11.9 100 each 12  . loratadine (CLARITIN) 10 MG tablet TAKE 1 TABLET BY MOUTH EVERY DAY (Patient taking differently: Take 10 mg by mouth daily. ) 30 tablet 11  . Melatonin 3 MG TABS  Take 10 mg by mouth at bedtime.     . metFORMIN (GLUCOPHAGE-XR) 500 MG 24 hr tablet Take 2 tablets (1,000 mg total) by mouth in the morning and at bedtime. 360 tablet 3  . Multiple Vitamins-Minerals (VISION FORMULA) TABS Take 1 tablet by mouth daily.     [name] OVER THE COUNTER MEDICATION Take 1 tablet by mouth daily. Equate Vision Formula 50+     . sertraline (ZOLOFT) 50 MG tablet TAKE 1 TABLET BY MOUTH EVERY DAY 90 tablet 2  . TRUEPLUS LANCETS 30G MISC Use one lancet each time sugars are tested. Pt tests twice daily. Dx. E11.9 100 each 3  . UNABLE TO FIND Med Name: Healthwise talking meter 1 kit 0   No current facility-administered medications for this visit.    Allergies:   Patient has no known allergies.    Social History:  The patient  reports that she has never smoked. She has never used smokeless tobacco. She reports that she does not drink alcohol or use drugs.   Family History:  The patient's family history includes ADD / ADHD in her mother; Alcohol abuse in her mother; Dementia in her mother. She was adopted.    ROS: All other systems are reviewed and negative. Unless otherwise mentioned in H&P    PHYSICAL EXAM: VS:  BP 122/60   Pulse 63   Ht 5' (1.524 m)   Wt 183 lb (83 kg)   LMP 06/25/2008   BMI 35.74 kg/m  , BMI Body mass index is 35.74 kg/m. GEN: Well nourished, well developed, in no acute distress HEENT: normal Neck: no JVD, carotid bruits, or masses Cardiac:RRR; no murmurs, rubs, or gallops,no edema  Respiratory:  Clear to auscultation bilaterally, normal work of breathing GI: soft, nontender, nondistended, + BS MS: no deformity or atrophy Skin: warm and dry, no rash Neuro:  Strength and sensation are intact Psych: euthymic mood, full affect   EKG: Normal sinus rhythm, heart rate of 63 bpm, anteroseptal changes are noted.  (Personally reviewed)  Recent Labs: 03/03/2019: B Natriuretic Peptide 50.8 05/25/2019: ALT 16; BUN 45; Creatinine, Ser 1.15; Hemoglobin  12.6; Platelets 272.0; Potassium 4.6; Sodium 138; TSH 1.29    Lipid Panel    Component Value Date/Time   CHOL 141 05/25/2019 1352   TRIG 172.0 (H) 05/25/2019 1352   HDL 45.40 05/25/2019 1352   CHOLHDL 3 05/25/2019 1352   VLDL 34.4 05/25/2019 1352   LDLCALC 61 05/25/2019 1352   LDLDIRECT 55.0 10/21/2017 1503      Wt Readings from Last 3 Encounters:  11/09/19 183 lb (83 kg)  08/28/19 190 lb 3.2 oz (86.3 kg)  08/10/19 191 lb (  86.6 kg)      Other studies Reviewed: Myoview 02/02/19:Study Highlights    Nuclear stress EF: 54%.  There was no ST segment deviation noted during stress.  This is a low risk study.  The left ventricular ejection fraction is normal (55-65%).  The study is normal.  Low risk stress nuclear study with normal perfusion and normal left ventricular regional and global systolic function.    Echo 03/06/19:IMPRESSIONS 1. The left ventricle has hyperdynamic systolic function, with an ejection fraction of >65%. The cavity size was normal. There is moderate concentric left ventricular hypertrophy. Left ventricular diastolic Doppler parameters are consistent with  impaired relaxation. Elevated mean left atrial pressure No evidence of left ventricular regional wall motion abnormalities. 2. The right ventricle has normal systolic function. The cavity was normal. There is no increase in right ventricular wall thickness. Right ventricular systolic pressure could not be assessed. 3. Left atrial size was mildly dilated. 4. Trivial pericardial effusion is present. 5. The aorta is normal unless otherwise noted.    ASSESSMENT AND PLAN:  1.  Hypertension, blood pressure range has been much better than previous.  Blood pressures are lower early in the morning around 3 AM when her husband wakes up to go to work, he will check her BP then.  Later in the day it rises up into the 140s to 160s.  I will increase her carvedilol to 12.5 mg in the a.m. and 8.75 mg at  at bedtime.  She continues to avoid salt and is diligent about taking her blood pressure as directed.  She is medically compliant.  We will check a BMP today.  2.  Chronic diastolic heart failure: There is no evidence of fluid retention or volume overload on exam today.  She has lost 10 pounds and has been avoiding salt and exercising and doing her best to eat more healthy.  This clearly shows in her weights.  She continues to weigh herself every day and has not had to take any extra doses of Lasix for weight gain.  I have encouraged her to continue this regimen of weighing daily and salt avoidance.  She is doing very well.  We will have to be careful concerning her blood pressure control if she continues to lose weight to avoid hypotension.  3.  Hyperlipidemia: She remains on atorvastatin 40 mg daily.  Will need to have fasting lipids and LFTs either by primary care or by [place] within the next 6 months.  Goal of LDL less than 100.  4.  Type 2 diabetes: Continues on glipizide 5 mg daily and 2.5 mg before evening meal.  Followed by PCP.   Current medicines are reviewed at length with the patient today.  I have spent 25  minutes dedicated to the care of this patient on the date of this encounter to include pre-visit review of records, assessment, management and diagnostic testing,with shared decision making.  Labs/ tests ordered today include: BMET  Phill Myron. West Pugh, ANP, AACC   11/09/2019 1:58 PM    [facility] [place] [address] Office [phone] Fax [phone]  Notice: This dictation was prepared with Dragon dictation along with smaller phrase technology. Any transcriptional errors that result from this process are unintentional and may not be corrected upon review.

## 2019-11-09 ENCOUNTER — Ambulatory Visit (INDEPENDENT_AMBULATORY_CARE_PROVIDER_SITE_OTHER): Payer: Medicare Other | Admitting: Adult Health

## 2019-11-09 ENCOUNTER — Encounter: Payer: Self-pay | Admitting: Adult Health

## 2019-11-09 ENCOUNTER — Other Ambulatory Visit: Payer: Self-pay

## 2019-11-09 VITALS — BP 122/60 | HR 63 | Ht 60.0 in | Wt 183.0 lb

## 2019-11-09 DIAGNOSIS — N1831 Chronic kidney disease, stage 3a: Secondary | ICD-10-CM | POA: Diagnosis not present

## 2019-11-09 DIAGNOSIS — I5032 Chronic diastolic (congestive) heart failure: Secondary | ICD-10-CM

## 2019-11-09 DIAGNOSIS — E1121 Type 2 diabetes mellitus with diabetic nephropathy: Secondary | ICD-10-CM

## 2019-11-09 DIAGNOSIS — I10 Essential (primary) hypertension: Secondary | ICD-10-CM

## 2019-11-09 DIAGNOSIS — E7800 Pure hypercholesterolemia, unspecified: Secondary | ICD-10-CM | POA: Diagnosis not present

## 2019-11-09 MED ORDER — CARVEDILOL 6.25 MG PO TABS: ORAL_TABLET | ORAL | 3 refills | Status: DC

## 2019-11-09 NOTE — Patient Instructions (Signed)
Medication Instructions:  INCREASE- Carvedilol 12.5 mg in the morning and 1 1/2 tablet in the evening  *If you need a refill on your cardiac medications before your next appointment, please call your pharmacy*   Lab Work: BMP today  If you have labs (blood work) drawn today and your tests are completely normal, you will receive your results only by: [name] MyChart Message (if you have MyChart) OR . A paper copy in the mail If you have any lab test that is abnormal or we need to change your treatment, we will call you to review the results.   Testing/Procedures: None Ordered   Follow-Up: At [facility], you and your health needs are our priority.  As part of our continuing mission to provide you with exceptional heart care, we have created designated Provider Care Teams.  These Care Teams include your primary Cardiologist (physician) and Advanced Practice Providers (APPs -  Physician Assistants and Nurse Practitioners) who all work together to provide you with the care you need, when you need it.  We recommend signing up for the patient portal called "MyChart".  Sign up information is provided on this After Visit Summary.  MyChart is used to connect with patients for Virtual Visits (Telemedicine).  Patients are able to view lab/test results, encounter notes, upcoming appointments, etc.  Non-urgent messages can be sent to your provider as well.   To learn more about what you can do with MyChart, go to [email or web address].    Your next appointment:   3 month(s)  The format for your next appointment:   In Person  Provider:   You may see Peter [place], MD or one of the following Advanced Practice Providers on your designated Care Team:    Azalee Course, PA-C  Micah Flesher, PA-C or   Judy Pimple, [place]

## 2019-11-10 LAB — BASIC METABOLIC PANEL
BUN/Creatinine Ratio: 25 (ref 12–28)
BUN: 29 mg/dL — ABNORMAL HIGH (ref 8–27)
CO2: 24 mmol/L (ref 20–29)
Calcium: 10.8 mg/dL — ABNORMAL HIGH (ref 8.7–10.3)
Chloride: 104 mmol/L (ref 96–106)
Creatinine, Ser: 1.17 mg/dL — ABNORMAL HIGH (ref 0.57–1.00)
GFR calc Af Amer: 56 mL/min/{1.73_m2} — ABNORMAL LOW (ref >59)
GFR calc non Af Amer: 48 mL/min/{1.73_m2} — ABNORMAL LOW (ref >59)
Glucose: 108 mg/dL — ABNORMAL HIGH (ref 65–99)
Potassium: 4.8 mmol/L (ref 3.5–5.2)
Sodium: 143 mmol/L (ref 134–144)

## 2019-11-18 ENCOUNTER — Ambulatory Visit: Payer: Medicare Other | Admitting: Family Medicine

## 2019-11-19 ENCOUNTER — Ambulatory Visit (INDEPENDENT_AMBULATORY_CARE_PROVIDER_SITE_OTHER): Payer: Medicare Other | Admitting: Family Medicine

## 2019-11-19 ENCOUNTER — Encounter: Payer: Self-pay | Admitting: Family Medicine

## 2019-11-19 ENCOUNTER — Other Ambulatory Visit: Payer: Self-pay

## 2019-11-19 VITALS — BP 138/82 | HR 78 | Temp 97.9°F | Resp 16 | Ht 60.0 in | Wt 186.0 lb

## 2019-11-19 DIAGNOSIS — E785 Hyperlipidemia, unspecified: Secondary | ICD-10-CM

## 2019-11-19 DIAGNOSIS — M25552 Pain in left hip: Secondary | ICD-10-CM | POA: Diagnosis not present

## 2019-11-19 DIAGNOSIS — Z0000 Encounter for general adult medical examination without abnormal findings: Secondary | ICD-10-CM | POA: Diagnosis not present

## 2019-11-19 DIAGNOSIS — I10 Essential (primary) hypertension: Secondary | ICD-10-CM

## 2019-11-19 DIAGNOSIS — I5032 Chronic diastolic (congestive) heart failure: Secondary | ICD-10-CM | POA: Diagnosis not present

## 2019-11-19 DIAGNOSIS — G4733 Obstructive sleep apnea (adult) (pediatric): Secondary | ICD-10-CM | POA: Diagnosis not present

## 2019-11-19 DIAGNOSIS — E1122 Type 2 diabetes mellitus with diabetic chronic kidney disease: Secondary | ICD-10-CM

## 2019-11-19 DIAGNOSIS — E1121 Type 2 diabetes mellitus with diabetic nephropathy: Secondary | ICD-10-CM | POA: Diagnosis not present

## 2019-11-19 DIAGNOSIS — N1832 Chronic kidney disease, stage 3b: Secondary | ICD-10-CM

## 2019-11-19 NOTE — Assessment & Plan Note (Signed)
Chronic problem.  Following w/ Cards.  Currently asymptomatic.  Will follow along.

## 2019-11-19 NOTE — Assessment & Plan Note (Signed)
Ongoing issue for pt.  Stressed need for healthy diet and exercise as able.  Given her L hip pain, will refer to ortho so that she is able to be more mobile.  Pt expressed understanding and is in agreement w/ plan.

## 2019-11-19 NOTE — Patient Instructions (Addendum)
Follow up 6 months to recheck BP and cholesterol We'll notify you of your lab results and make any changes if needed Continue to work on healthy diet and regular exercise- you can do it! We'll call you with your Orthopedic appt for the hip pain Call with any questions or concerns Have a great summer!!!   Preventive Care 68 Years and Older, Female Preventive care refers to lifestyle choices and visits with your health care provider that can promote health and wellness. This includes:  A yearly physical exam. This is also called an annual well check.  Regular dental and eye exams.  Immunizations.  Screening for certain conditions.  Healthy lifestyle choices, such as diet and exercise. What can I expect for my preventive care visit? Physical exam Your health care provider will check:  Height and weight. These may be used to calculate body mass index (BMI), which is a measurement that tells if you are at a healthy weight.  Heart rate and blood pressure.  Your skin for abnormal spots. Counseling Your health care provider may ask you questions about:  Alcohol, tobacco, and drug use.  Emotional well-being.  Home and relationship well-being.  Sexual activity.  Eating habits.  History of falls.  Memory and ability to understand (cognition).  Work and work Statistician.  Pregnancy and menstrual history. What immunizations do I need?  Influenza (flu) vaccine  This is recommended every year. Tetanus, diphtheria, and pertussis (Tdap) vaccine  You may need a Td booster every 10 years. Varicella (chickenpox) vaccine  You may need this vaccine if you have not already been vaccinated. Zoster (shingles) vaccine  You may need this after age 81. Pneumococcal conjugate (PCV13) vaccine  One dose is recommended after age 19. Pneumococcal polysaccharide (PPSV23) vaccine  One dose is recommended after age 36. Measles, mumps, and rubella (MMR) vaccine  You may need at  least one dose of MMR if you were born in 1957 or later. You may also need a second dose. Meningococcal conjugate (MenACWY) vaccine  You may need this if you have certain conditions. Hepatitis A vaccine  You may need this if you have certain conditions or if you travel or work in places where you may be exposed to hepatitis A. Hepatitis B vaccine  You may need this if you have certain conditions or if you travel or work in places where you may be exposed to hepatitis B. Haemophilus influenzae type b (Hib) vaccine  You may need this if you have certain conditions. You may receive vaccines as individual doses or as more than one vaccine together in one shot (combination vaccines). Talk with your health care provider about the risks and benefits of combination vaccines. What tests do I need? Blood tests  Lipid and cholesterol levels. These may be checked every 5 years, or more frequently depending on your overall health.  Hepatitis C test.  Hepatitis B test. Screening  Lung cancer screening. You may have this screening every year starting at age 57 if you have a 30-pack-year history of smoking and currently smoke or have quit within the past 15 years.  Colorectal cancer screening. All adults should have this screening starting at age 66 and continuing until age 17. Your health care provider may recommend screening at age 15 if you are at increased risk. You will have tests every 1-10 years, depending on your results and the type of screening test.  Diabetes screening. This is done by checking your blood sugar (glucose) after you have  not eaten for a while (fasting). You may have this done every 1-3 years.  Mammogram. This may be done every 1-2 years. Talk with your health care provider about how often you should have regular mammograms.  BRCA-related cancer screening. This may be done if you have a family history of breast, ovarian, tubal, or peritoneal cancers. Other tests  Sexually  transmitted disease (STD) testing.  Bone density scan. This is done to screen for osteoporosis. You may have this done starting at age 19. Follow these instructions at home: Eating and drinking  Eat a diet that includes fresh fruits and vegetables, whole grains, lean protein, and low-fat dairy products. Limit your intake of foods with high amounts of sugar, saturated fats, and salt.  Take vitamin and mineral supplements as recommended by your health care provider.  Do not drink alcohol if your health care provider tells you not to drink.  If you drink alcohol: ? Limit how much you have to 0-1 drink a day. ? Be aware of how much alcohol is in your drink. In the [place] one drink equals one 12 oz bottle of beer (355 mL), one 5 oz glass of wine (148 mL), or one 1 oz glass of hard liquor (44 mL). Lifestyle  Take daily care of your teeth and gums.  Stay active. Exercise for at least 30 minutes on 5 or more days each week.  Do not use any products that contain nicotine or tobacco, such as cigarettes, e-cigarettes, and chewing tobacco. If you need help quitting, ask your health care provider.  If you are sexually active, practice safe sex. Use a condom or other form of protection in order to prevent STIs (sexually transmitted infections).  Talk with your health care provider about taking a low-dose aspirin or statin. What's next?  Go to your health care provider once a year for a well check visit.  Ask your health care provider how often you should have your eyes and teeth checked.  Stay up to date on all vaccines. This information is not intended to replace advice given to you by your health care provider. Make sure you discuss any questions you have with your health care provider. Document Revised: 06/05/2018 Document Reviewed: 06/05/2018 Elsevier Patient Education  2020 [employer].

## 2019-11-19 NOTE — Assessment & Plan Note (Signed)
Ongoing issue for pt.  On CPAP nightly.

## 2019-11-19 NOTE — Assessment & Plan Note (Signed)
Chronic problem.  Tolerating Metformin and Glipizide w/o difficulty.  UTD on foot exam, eye exam, and microalbumin.  Check labs.  Adjust meds prn

## 2019-11-19 NOTE — Assessment & Plan Note (Signed)
Chronic problem.  Adequate control.  Asymptomatic.  Check labs.  No anticipated med changes.  Will follow. 

## 2019-11-19 NOTE — Assessment & Plan Note (Signed)
Chronic problem.  Tolerating statin w/o difficulty.  Check labs.  Adjust meds prn  

## 2019-11-19 NOTE — Progress Notes (Signed)
Subjective:    Patient ID: Kayla Brown, female    DOB: July 31, 1950, 69 y.o.   MRN: [id number]  HPI Here today for MWV.  Risk Factors: HTN- chronic problem, on Amlodipine10mg  daily, Coreg BID, Lasix 20mg  daily.  Adequate control today.  No CP, SOB, HAs, visual changes, edema. CHF- chronic problem, following w/ Cards.  No signs of fluid overload.  Pt is weighing daily Hyperlipidemia- chronic problem, on Lipitor 40mg  daily and fish oil.  Denies abd pain, N/V. Morbid obesity- ongoing issue for pt.  BMI is 36.33 OSA- using CPAP regularly DM- chronic problem, on Glipizide 5mg  daily, Metformin XR 1000mg  BID.  No symptomatic lows.  No numbness/tingling of hands/feet. L buttock/hip pain- no radiation of pain.  sxs started 1-2 months ago.  Pt has had to resort to using a walker for pain and support Physical Activity: walking 3-4x/week for 20 minutes and PT exercises Fall Risk: increased fall risk due to hip pain Depression: denies current sxs.  On sertraline daily Hearing: normal to conversational tones, decreased to whispered voice ADL's: independent Cognitive: normal linear thought process, memory and attention intact Home Safety: safe at home, lives w/ husband Height, Weight, BMI, Visual Acuity: see vitals, vision corrected to 20/20 w/ glasses Counseling: UTD on mammogram, eye exam, foot exam, DEXA, immunizations, colonoscopy Labs Ordered: See A&P Care Plan: See A&P   Patient Care Team    Relationship Specialty Notifications Start End  , MD PCP - General Family Medicine  07/06/16   , MD (Inactive) Consulting Physician Gastroenterology  07/06/16   Sheliah Hatch, MD Consulting Physician Gynecology  07/06/16   Carman Ching, RN [facility] Care Management  Admissions 03/24/19   Jerene Bears, [facility] Pharmacist Pharmacist Admissions 10/02/19    Comment: phone number [phone]     Health Maintenance  Topic Date Due  . COVID-19 Vaccine (1) Never  done  . TETANUS/TDAP  11/18/2020 (Originally 09/24/2019)  . INFLUENZA VACCINE  01/24/2020  . HEMOGLOBIN A1C  02/28/2020  . FOOT EXAM  03/19/2020  . URINE MICROALBUMIN  05/24/2020  . OPHTHALMOLOGY EXAM  07/27/2020  . MAMMOGRAM  01/15/2021  . COLONOSCOPY  12/15/2026  . DEXA SCAN  Completed  . Hepatitis C Screening  Completed  . PNA vac Low Risk Adult  Completed     Review of Systems For ROS see HPI   This visit occurred during the SARS-CoV-2 public health emergency.  Safety protocols were in place, including screening questions prior to the visit, additional usage of staff PPE, and extensive cleaning of exam room while observing appropriate contact time as indicated for disinfecting solutions.       Objective:   Physical Exam Vitals reviewed.  Constitutional:      General: She is not in acute distress.    Appearance: She is well-developed. She is obese.  HENT:     Head: Normocephalic and atraumatic.  Eyes:     Conjunctiva/sclera: Conjunctivae normal.  Neck:     Thyroid: No thyromegaly.  Cardiovascular:     Rate and Rhythm: Normal rate and regular rhythm.     Heart sounds: Normal heart sounds. No murmur.  Pulmonary:     Effort: Pulmonary effort is normal. No respiratory distress.     Breath sounds: Normal breath sounds.  Abdominal:     General: There is no distension.     Palpations: Abdomen is soft.     Tenderness: There is no abdominal tenderness.  Musculoskeletal:  Cervical back: Normal range of motion and neck supple.  Lymphadenopathy:     Cervical: No cervical adenopathy.  Skin:    General: Skin is warm and dry.  Neurological:     Mental Status: She is alert and oriented to person, place, and time.  Psychiatric:        Behavior: Behavior normal.           Assessment & Plan:

## 2019-11-20 LAB — CBC WITH DIFFERENTIAL/PLATELET
Basophils Absolute: 0.1 10*3/uL (ref 0.0–0.1)
Basophils Relative: 0.7 % (ref 0.0–3.0)
Eosinophils Absolute: 1.1 10*3/uL — ABNORMAL HIGH (ref 0.0–0.7)
Eosinophils Relative: 11 % — ABNORMAL HIGH (ref 0.0–5.0)
HCT: 37.5 % (ref 36.0–46.0)
Hemoglobin: 12.7 g/dL (ref 12.0–15.0)
Lymphocytes Relative: 22.5 % (ref 12.0–46.0)
Lymphs Abs: 2.3 10*3/uL (ref 0.7–4.0)
MCHC: 33.9 g/dL (ref 30.0–36.0)
MCV: 89.9 fl (ref 78.0–100.0)
Monocytes Absolute: 0.9 10*3/uL (ref 0.1–1.0)
Monocytes Relative: 9.1 % (ref 3.0–12.0)
Neutro Abs: 5.9 10*3/uL (ref 1.4–7.7)
Neutrophils Relative %: 56.7 % (ref 43.0–77.0)
Platelets: 294 10*3/uL (ref 150.0–400.0)
RBC: 4.18 Mil/uL (ref 3.87–5.11)
RDW: 15.2 % (ref 11.5–15.5)
WBC: 10.4 10*3/uL (ref 4.0–10.5)

## 2019-11-20 LAB — HEMOGLOBIN A1C: Hgb A1c MFr Bld: 7.3 % — ABNORMAL HIGH (ref 4.6–6.5)

## 2019-11-20 LAB — HEPATIC FUNCTION PANEL
ALT: 16 U/L (ref 0–35)
AST: 13 U/L (ref 0–37)
Albumin: 4.5 g/dL (ref 3.5–5.2)
Alkaline Phosphatase: 64 U/L (ref 39–117)
Bilirubin, Direct: 0.1 mg/dL (ref 0.0–0.3)
Total Bilirubin: 0.3 mg/dL (ref 0.2–1.2)
Total Protein: 6.9 g/dL (ref 6.0–8.3)

## 2019-11-20 LAB — LIPID PANEL
Cholesterol: 135 mg/dL (ref 0–200)
HDL: 44.8 mg/dL (ref >39.00)
NonHDL: 90.59
Total CHOL/HDL Ratio: 3
Triglycerides: 210 mg/dL — ABNORMAL HIGH (ref 0.0–149.0)
VLDL: 42 mg/dL — ABNORMAL HIGH (ref 0.0–40.0)

## 2019-11-20 LAB — TSH: TSH: 1.4 u[IU]/mL (ref 0.35–4.50)

## 2019-11-20 LAB — BASIC METABOLIC PANEL
BUN: 30 mg/dL — ABNORMAL HIGH (ref 6–23)
CO2: 26 mEq/L (ref 19–32)
Calcium: 10.2 mg/dL (ref 8.4–10.5)
Chloride: 104 mEq/L (ref 96–112)
Creatinine, Ser: 1.08 mg/dL (ref 0.40–1.20)
GFR: 50.5 mL/min — ABNORMAL LOW (ref >60.00)
Glucose, Bld: 179 mg/dL — ABNORMAL HIGH (ref 70–99)
Potassium: 4.2 mEq/L (ref 3.5–5.1)
Sodium: 140 mEq/L (ref 135–145)

## 2019-11-20 LAB — LDL CHOLESTEROL, DIRECT: Direct LDL: 65 mg/dL

## 2019-11-25 ENCOUNTER — Ambulatory Visit: Payer: Medicare Other

## 2019-11-25 ENCOUNTER — Other Ambulatory Visit: Payer: Self-pay

## 2019-11-25 DIAGNOSIS — F419 Anxiety disorder, unspecified: Secondary | ICD-10-CM

## 2019-11-25 DIAGNOSIS — N1832 Chronic kidney disease, stage 3b: Secondary | ICD-10-CM

## 2019-11-25 DIAGNOSIS — E785 Hyperlipidemia, unspecified: Secondary | ICD-10-CM

## 2019-11-25 NOTE — Patient Instructions (Addendum)
Please call me at [phone] (direct line) with any questions - thank you!  - Bard Herbert., Clinical Pharmacist  Goals Addressed            This Visit's Progress   . A1c <7%       CARE PLAN ENTRY . Diabetes: type 2 Lab Results  Component Value Date   HGBA1C 7.3 (H) 11/19/2019   Lab Results  Component Value Date   CREATININE 1.08 11/19/2019   CREATININE 1.17 (H) 11/09/2019   CREATININE 1.15 05/25/2019 .  Current antihyperglycemic regimen:  Metformin XL 500 mg two tabs (1000 mg) twice daily  Glipizide IR 5 mg every morning before breakfast, and 2.5 mg before supper   Pharmacist Clinical Goal(s):  [name] Over the next 180 days, patient will work with PharmD and primary care provider as needed to achieve A1c<7%  Interventions: . Comprehensive medication review performed, medication list updated in electronic medical record . Inter-disciplinary care team collaboration (see longitudinal plan of care)  Patient Self Care Activities:  . Reduce consumption of sweets . Patient will check blood glucose every morning, document, and provide at future appointments . Patient will focus on medication adherence by continuing current medication management  . Patient will take medications as prescribed . Patient will contact provider with any episodes of hypoglycemia . Patient will report any questions or concerns to provider   Initial goal documentation.    . BP <140/90       CARE PLAN ENTRY (see longitudinal plan of care for additional care plan information)  Current Barriers:  . Uncontrolled hypertension, complicated by Chronic Kidney Disease, Diabetes, Heart Failure . Current antihypertensive regimen: amlodipine 10 mg, carvedilol 6.25 mg tabs as 2 tabs in morning and 1.5 tabs in evening, furosemide 20 mg daily . Previously on RAASi therapy with spironolactone and losartan but was stopped after developing AKI.  [name] Last practice recorded BP readings:  BP Readings from Last 3 Encounters:   11/19/19 138/82  11/09/19 122/60  08/28/19 136/78  Recent home BP readings:  5/18 1pm 144/59  5/19 2pm 153/65  5/23 2pm 147/59  5/25 2pm 139/56  6/1  1pm 157/61  Previously Reported Home BPs:   Morning (4/26-4/29): 135-141/57-67  Afternoon (4/14-4/16): 148-164/65-71  Pharmacist Clinical Goal(s):  [name] Over the next 180 days, patient will work with PharmD and providers to optimize antihypertensive regimen.  Interventions: . Inter-disciplinary care team collaboration (see longitudinal plan of care). . Comprehensive medication review performed; medication list updated in the electronic medical record.  . Monitor BP daily at 1pm unless instructed otherwise at May cardiology appointment.   Patient Self Care Activities:  . Patient will continue to check BP daily, document, and provide at future appointments. . Patient will focus on medication adherence by continuing with current medication management. . Consider pharmacy services we discussed to help with medication organization through pill packaging and medication delivery. . Continue daily weights for heart failure.   Initial goal documentation     . LDL <100       CARE PLAN ENTRY (see longitudinal plan of care for additional care plan information)  Current Barriers:  . Current antihyperlipidemic regimen: atorvastatin 40 mg daily, fish oil 1000 mg daily  . Most recent lipid panel:     Component Value Date/Time   CHOL 135 11/19/2019 1450   TRIG 210.0 (H) 11/19/2019 1450   HDL 44.80 11/19/2019 1450   CHOLHDL 3 11/19/2019 1450   VLDL 42.0 (H) 11/19/2019 1450   LDLCALC 61  05/25/2019 1352   LDLDIRECT 65.0 11/19/2019 1450   . ASCVD risk enhancing conditions: age >29, DM, HTN, CKD, CHF  Pharmacist Clinical Goal(s):  [name] Over the next 180 days, patient will work with PharmD and providers towards optimized antihyperlipidemic therapy  Interventions: . Comprehensive medication review performed; medication list updated in  electronic medical record.  Rene Paci care team collaboration (see longitudinal plan of care)  Patient Self Care Activities:  . Patient will focus on medication adherence by continuing current medication management.   Initial goal documentation.       Ms. Hitch was given information about Chronic Care Management services today including:  1. CCM service includes personalized support from designated clinical staff supervised by her physician, including individualized plan of care and coordination with other care providers 2. 24/7 contact phone numbers for assistance for urgent and routine care needs. 3. Standard insurance, coinsurance, copays and deductibles apply for chronic care management only during months in which we provide at least 20 minutes of these services. Most insurances cover these services at 100%, however patients may be responsible for any copay, coinsurance and/or deductible if applicable. This service may help you avoid the need for more expensive face-to-face services. 4. Only one practitioner may furnish and bill the service in a calendar month. 5. The patient may stop CCM services at any time (effective at the end of the month) by phone call to the office staff.  Patient agreed to services and verbal consent obtained.   The patient verbalized understanding of instructions provided today and agreed to receive a mailed copy of patient instruction and/or educational materials. Telephone follow up appointment with pharmacy team member scheduled for: See next appointment with "Care Management Staff" under "What's Next" below.   Thank you!  Dahlia Byes, Pharm.D., BCGP Clinical Pharmacist [facility] [phone]  Diabetes Mellitus and Nutrition, Adult When you have diabetes (diabetes mellitus), it is very important to have healthy eating habits because your blood sugar (glucose) levels are greatly affected by what you eat and  drink. Eating healthy foods in the appropriate amounts, at about the same times every day, can help you:  Control your blood glucose.  Lower your risk of heart disease.  Improve your blood pressure.  Reach or maintain a healthy weight. Every person with diabetes is different, and each person has different needs for a meal plan. Your health care provider may recommend that you work with a diet and nutrition specialist (dietitian) to make a meal plan that is best for you. Your meal plan may vary depending on factors such as:  The calories you need.  The medicines you take.  Your weight.  Your blood glucose, blood pressure, and cholesterol levels.  Your activity level.  Other health conditions you have, such as heart or kidney disease. How do carbohydrates affect me? Carbohydrates, also called carbs, affect your blood glucose level more than any other type of food. Eating carbs naturally raises the amount of glucose in your blood. Carb counting is a method for keeping track of how many carbs you eat. Counting carbs is important to keep your blood glucose at a healthy level, especially if you use insulin or take certain oral diabetes medicines. It is important to know how many carbs you can safely have in each meal. This is different for every person. Your dietitian can help you calculate how many carbs you should have at each meal and for each snack. Foods that contain carbs  include:  Bread, cereal, rice, pasta, and crackers.  Potatoes and corn.  Peas, beans, and lentils.  Milk and yogurt.  Fruit and juice.  Desserts, such as cakes, cookies, ice cream, and candy. How does alcohol affect me? Alcohol can cause a sudden decrease in blood glucose (hypoglycemia), especially if you use insulin or take certain oral diabetes medicines. Hypoglycemia can be a life-threatening condition. Symptoms of hypoglycemia (sleepiness, dizziness, and confusion) are similar to symptoms of having too  much alcohol. If your health care provider says that alcohol is safe for you, follow these guidelines:  Limit alcohol intake to no more than 1 drink per day for nonpregnant women and 2 drinks per day for men. One drink equals 12 oz of beer, 5 oz of wine, or 1 oz of hard liquor.  Do not drink on an empty stomach.  Keep yourself hydrated with water, diet soda, or unsweetened iced tea.  Keep in mind that regular soda, juice, and other mixers may contain a lot of sugar and must be counted as carbs. What are tips for following this plan?  Reading food labels  Start by checking the serving size on the "Nutrition Facts" label of packaged foods and drinks. The amount of calories, carbs, fats, and other nutrients listed on the label is based on one serving of the item. Many items contain more than one serving per package.  Check the total grams (g) of carbs in one serving. You can calculate the number of servings of carbs in one serving by dividing the total carbs by 15. For example, if a food has 30 g of total carbs, it would be equal to 2 servings of carbs.  Check the number of grams (g) of saturated and trans fats in one serving. Choose foods that have low or no amount of these fats.  Check the number of milligrams (mg) of salt (sodium) in one serving. Most people should limit total sodium intake to less than 2,300 mg per day.  Always check the nutrition information of foods labeled as "low-fat" or "nonfat". These foods may be higher in added sugar or refined carbs and should be avoided.  Talk to your dietitian to identify your daily goals for nutrients listed on the label. Shopping  Avoid buying canned, premade, or processed foods. These foods tend to be high in fat, sodium, and added sugar.  Shop around the outside edge of the grocery store. This includes fresh fruits and vegetables, bulk grains, fresh meats, and fresh dairy. Cooking  Use low-heat cooking methods, such as baking, instead  of high-heat cooking methods like deep frying.  Cook using healthy oils, such as olive, canola, or sunflower oil.  Avoid cooking with butter, cream, or high-fat meats. Meal planning  Eat meals and snacks regularly, preferably at the same times every day. Avoid going long periods of time without eating.  Eat foods high in fiber, such as fresh fruits, vegetables, beans, and whole grains. Talk to your dietitian about how many servings of carbs you can eat at each meal.  Eat 4-6 ounces (oz) of lean protein each day, such as lean meat, chicken, fish, eggs, or tofu. One oz of lean protein is equal to: ? 1 oz of meat, chicken, or fish. ? 1 egg. ?  cup of tofu.  Eat some foods each day that contain healthy fats, such as avocado, nuts, seeds, and fish. Lifestyle  Check your blood glucose regularly.  Exercise regularly as told by your health care provider.  This may include: ? 150 minutes of moderate-intensity or vigorous-intensity exercise each week. This could be brisk walking, biking, or water aerobics. ? Stretching and doing strength exercises, such as yoga or weightlifting, at least 2 times a week.  Take medicines as told by your health care provider.  Do not use any products that contain nicotine or tobacco, such as cigarettes and e-cigarettes. If you need help quitting, ask your health care provider.  Work with a Veterinary surgeon or diabetes educator to identify strategies to manage stress and any emotional and social challenges. Questions to ask a health care provider  Do I need to meet with a diabetes educator?  Do I need to meet with a dietitian?  What number can I call if I have questions?  When are the best times to check my blood glucose? Where to find more information:  American Diabetes Association: diabetes.org  Academy of Nutrition and Dietetics: www.eatright.[employer] of Diabetes and Digestive and Kidney Diseases (NIH): CarF[employer].tn Summary  A  healthy meal plan will help you control your blood glucose and maintain a healthy lifestyle.  Working with a diet and nutrition specialist (dietitian) can help you make a meal plan that is best for you.  Keep in mind that carbohydrates (carbs) and alcohol have immediate effects on your blood glucose levels. It is important to count carbs and to use alcohol carefully. This information is not intended to replace advice given to you by your health care provider. Make sure you discuss any questions you have with your health care provider. Document Revised: 05/24/2017 Document Reviewed: 07/16/2016 Elsevier Patient Education  2020 [employer].

## 2019-11-25 NOTE — Progress Notes (Signed)
Chronic Care Management Pharmacy  Name: Kayla Brown  MRN: [id number] DOB: February 13, 1951  Chief Complaint/HPI  Kayla Brown,  69 y.o. , female presents for their Follow-Up CCM visit with the clinical pharmacist via telephone due to COVID-19 Pandemic.  PCP : Sheliah Hatch, MD  Their chronic conditions include: Hypertension, Hyperlipidemia, Diabetes, Heart Failure and CKD.  Office Visits:  5/27 (PCP): L buttock/hip pain- no radiation of pain.  sxs started 1-2 months ago - referral to ortho. a1c increased on 10/2019 labs.   4/30 [facility]): monitoring plan for home BP, variable am/pm systolic.   11/30 (PCP): 4 lbs weight loss since last OV. Consult Visits:  3/5 (endo, Dr. Lonzo Cloud): hypoglycemic episodes avg 1x/month.   2/15 (cardio, Joni Reining): bp elevated at visit 173/79, on repeat 158/68. Home monitoring every day at 1 pm. Needs labs.   Patient Active Problem List   Diagnosis Date Noted  . Type 2 diabetes mellitus with stage 3b chronic kidney disease, without long-term current use of insulin (HCC) 04/03/2019  . Diastolic CHF (HCC) 03/18/2019  . Dyspnea 03/08/2019  . Pulmonary edema 03/04/2019  . Physical exam 02/06/2017  . Anxiety and depression 12/05/2016  . Morbid obesity (HCC) 03/01/2016  . OSA (obstructive sleep apnea) 07/11/2015  . Pulmonary hypertension (HCC) 05/06/2015  . Heart murmur 03/03/2015  . Essential hypertension, benign 03/18/2014  . Hyperlipidemia 03/18/2014  . Post-menopause on HRT (hormone replacement therapy) 03/18/2014   Social History   Socioeconomic History  . Marital status: Married    Spouse name: Not on file  . Number of children: Not on file  . Years of education: Not on file  . Highest education level: Not on file  Occupational History  . Not on file  Tobacco Use  . Smoking status: Never Smoker  . Smokeless tobacco: Never Used  Substance and Sexual Activity  . Alcohol use: No    Alcohol/week: 0.0 standard drinks  . Drug  use: No  . Sexual activity: Yes    Partners: Male    Birth control/protection: None  Other Topics Concern  . Not on file  Social History Narrative  . Not on file   Social Determinants of Health   Financial Resource Strain:   . Difficulty of Paying Living Expenses:   Food Insecurity: No Food Insecurity  . Worried About Programme researcher, broadcasting/film/video in the Last Year: Never true  . Ran Out of Food in the Last Year: Never true  Transportation Needs: No Transportation Needs  . Lack of Transportation (Medical): No  . Lack of Transportation (Non-Medical): No  Physical Activity:   . Days of Exercise per Week:   . Minutes of Exercise per Session:   Stress:   . Feeling of Stress :   Social Connections:   . Frequency of Communication with Friends and Family:   . Frequency of Social Gatherings with Friends and Family:   . Attends Religious Services:   . Active Member of Clubs or Organizations:   . Attends Banker Meetings:   [name] Marital Status:    Family History  Adopted: Yes  Problem Relation Age of Onset  . Alcohol abuse Mother   . ADD / ADHD Mother   . Dementia Mother   . Breast cancer Neg Hx    No Known Allergies  Outpatient Encounter Medications as of 11/25/2019  Medication Sig  . albuterol (PROAIR HFA) 108 (90 Base) MCG/ACT inhaler Inhale 1-2 puffs into the lungs every 6 (six) hours as needed  for wheezing or shortness of breath.  . Alcohol Swabs (ALCOHOL PREP) PADS Pt uses an alcohol pad each time sugars are tested. Pt tests twice daily. Dx E11.9  . amLODipine (NORVASC) 10 MG tablet TAKE 1 TABLET BY MOUTH EVERY DAY  . atorvastatin (LIPITOR) 40 MG tablet TAKE 1 TABLET BY MOUTH EVERY DAY  . carvedilol (COREG) 6.25 MG tablet Take 2 tablets in the morning and 1 1/2(9.375 mg) tablet at bedtime  . Cholecalciferol (VITAMIN D PO) Take 2,000 Units by mouth daily.   . fish oil-omega-3 fatty acids 1000 MG capsule Take 1 g by mouth daily.  . fluticasone (FLONASE) 50 MCG/ACT nasal  spray SPRAY 2 SPRAYS INTO EACH NOSTRIL EVERY DAY (Patient taking differently: Place 2 sprays into both nostrils daily. )  . furosemide (LASIX) 20 MG tablet Take 1 tablet (20 mg total) by mouth daily.  [name] glipiZIDE (GLUCOTROL) 5 MG tablet Take 1 tablet (5 mg total) by mouth daily before breakfast AND 0.5 tablets (2.5 mg total) daily before supper.  [name] glucose blood (FREESTYLE LITE) test strip USE TO TEST BLOOD GLUCOSE 2 TIMES DAILY. Dx E11.9  . loratadine (CLARITIN) 10 MG tablet TAKE 1 TABLET BY MOUTH EVERY DAY (Patient taking differently: Take 10 mg by mouth daily. )  . Melatonin 3 MG TABS Take 10 mg by mouth at bedtime.   . metFORMIN (GLUCOPHAGE-XR) 500 MG 24 hr tablet Take 2 tablets (1,000 mg total) by mouth in the morning and at bedtime.  . Multiple Vitamins-Minerals (VISION FORMULA) TABS Take 1 tablet by mouth daily.   [name] OVER THE COUNTER MEDICATION Take 1 tablet by mouth daily. Equate Vision Formula 50+   . sertraline (ZOLOFT) 50 MG tablet TAKE 1 TABLET BY MOUTH EVERY DAY  . TRUEPLUS LANCETS 30G MISC Use one lancet each time sugars are tested. Pt tests twice daily. Dx. E11.9  . UNABLE TO FIND Med Name: Healthwise talking meter   No facility-administered encounter medications on file as of 11/25/2019.   Patient Care Team    Relationship Specialty Notifications Start End  Sheliah Hatch, MD PCP - General Family Medicine  07/06/16   Carman Ching, MD (Inactive) Consulting Physician Gastroenterology  07/06/16   Jerene Bears, MD Consulting Physician Gynecology  07/06/16   Alejandro Mulling, RN [facility] Management  Admissions 03/24/19   Dahlia Byes, [facility] Pharmacist Pharmacist Admissions 10/02/19    Comment: phone number [phone]   Current Diagnosis/Assessment: Goals Addressed            This Visit's Progress   . A1c <7%   On track    CARE PLAN ENTRY . Diabetes: type 2 Lab Results  Component Value Date   HGBA1C 7.3 (H) 11/19/2019   Lab Results  Component  Value Date   CREATININE 1.08 11/19/2019   CREATININE 1.17 (H) 11/09/2019   CREATININE 1.15 05/25/2019 .  Current antihyperglycemic regimen:  Metformin XL 500 mg two tabs (1000 mg) twice daily  Glipizide IR 5 mg every morning before breakfast, and 2.5 mg before supper   Pharmacist Clinical Goal(s):  [name] Over the next 180 days, patient will work with PharmD and primary care provider as needed to achieve A1c<7%  Interventions: . Comprehensive medication review performed, medication list updated in electronic medical record . Inter-disciplinary care team collaboration (see longitudinal plan of care)  Patient Self Care Activities:  . Reduce consumption of sweets . Patient will check blood glucose every morning, document, and provide at future appointments .  Patient will focus on medication adherence by continuing current medication management  . Patient will take medications as prescribed . Patient will contact provider with any episodes of hypoglycemia . Patient will report any questions or concerns to provider   Initial goal documentation.    . BP <140/90   On track    [place] (see longitudinal plan of care for additional care plan information)  Current Barriers:  . Uncontrolled hypertension, complicated by Chronic Kidney Disease, Diabetes, Heart Failure . Current antihypertensive regimen: amlodipine 10 mg, carvedilol 6.25 mg tabs as 2 tabs in morning and 1.5 tabs in evening, furosemide 20 mg daily . Previously on RAASi therapy with spironolactone and losartan but was stopped after developing AKI.  [name] Last practice recorded BP readings:  BP Readings from Last 3 Encounters:  11/19/19 138/82  11/09/19 122/60  08/28/19 136/78  Recent home BP readings:  5/18 1pm 144/59  5/19 2pm 153/65  5/23 2pm 147/59  5/25 2pm 139/56  6/1  1pm 157/61  Previously Reported Home BPs:   Morning (4/26-4/29): 135-141/57-67  Afternoon (4/14-4/16): 148-164/65-71  Pharmacist Clinical  Goal(s):  [name] Over the next 180 days, patient will work with PharmD and providers to optimize antihypertensive regimen.  Interventions: . Inter-disciplinary care team collaboration (see longitudinal plan of care). . Comprehensive medication review performed; medication list updated in the electronic medical record.  . Monitor BP daily at 1pm unless instructed otherwise at May cardiology appointment.   Patient Self Care Activities:  . Patient will continue to check BP daily, document, and provide at future appointments. . Patient will focus on medication adherence by continuing with current medication management. . Consider pharmacy services we discussed to help with medication organization through pill packaging and medication delivery. . Continue daily weights for heart failure.   Initial goal documentation     . LDL <100   On track    [place] (see longitudinal plan of care for additional care plan information)  Current Barriers:  . Current antihyperlipidemic regimen: atorvastatin 40 mg daily, fish oil 1000 mg daily  . Most recent lipid panel:     Component Value Date/Time   CHOL 135 11/19/2019 1450   TRIG 210.0 (H) 11/19/2019 1450   HDL 44.80 11/19/2019 1450   CHOLHDL 3 11/19/2019 1450   VLDL 42.0 (H) 11/19/2019 1450   LDLCALC 61 05/25/2019 1352   LDLDIRECT 65.0 11/19/2019 1450   . ASCVD risk enhancing conditions: age >82, DM, HTN, CKD, CHF  Pharmacist Clinical Goal(s):  [name] Over the next 180 days, patient will work with PharmD and providers towards optimized antihyperlipidemic therapy  Interventions: . Comprehensive medication review performed; medication list updated in electronic medical record.  Bertram Savin care team collaboration (see longitudinal plan of care)  Patient Self Care Activities:  . Patient will focus on medication adherence by continuing current medication management.   Initial goal documentation.       Hypertension / HF   BP  Readings from Last 3 Encounters:  11/19/19 138/82  11/09/19 122/60  08/28/19 136/78   EF>65% 02/2019. Patient has been checking home BP daily. Note readings below. Previously on RAAS therapy with spironolactone and losartan but was stopped after developing AKI.   Recent home readings:  5/18 1pm 144/59  5/19 2pm 153/65  5/23 2pm 147/59  5/25 2pm 139/56  6/1  1pm 157/61  Previously Reported Home BPs:   Morning (4/26-4/29): 135-141/57-67  Afternoon (4/14-4/16): 148-164/65-71  Coreg recently increased to coreg 6.25 mg  tabs 2 tabs in morning and 1.5 tabs in evening daily. Performs daily weights, expresses understanding of when to take additional furosemide or seek care. Has not needed to take additional furosemide 20 mg since last cardio OV. Denies dizziness, SOB, chest pain.  Currently taking:  Amlodipine 10 mg daily  Carvedilol 6.25 mg tabs - 2 tabs in morning, 1.5 tabs in evening daily  Furosemide 20 mg daily  Plan Continue current medications.   Diabetes   Lab Results  Component Value Date/Time   HGBA1C 7.3 (H) 11/19/2019 02:50 PM   HGBA1C 6.7 (A) 08/28/2019 02:17 PM   HGBA1C 6.8 (A) 05/29/2019 02:06 PM   HGBA1C 8.0 (H) 03/06/2019 04:40 AM   HGBA1C 6.7 02/06/2016 12:00 AM   MICROALBUR 4.7 (H) 05/25/2019 01:52 PM     Lab Results  Component Value Date/Time   HMDIABEYEEXA No Retinopathy 07/28/2019 12:00 AM    Lab Results  Component Value Date/Time   HMDIABFOOTEX completed 01/24/2016 12:00 AM   A1c increased from 6.7 to 7.3% 10/2019.  Checking BG: every morning. 110-140 fasting within last week.   Suspects that sweets might be contributing to her increased a1c - will cut down on icecream. Denies any hypoglycemia within the last month.  Previously on Actos (stopped-HF), Glipizide XL (stopped-minimize hypoglycemia), RAASi hx (stopped-AKI).  Patient is currently taking:   Metformin XL 500 mg two tabs (1000 mg) twice daily  Glipizide IR 5 mg every morning  before breakfast, and 2.5 mg before supper   Plan Continue current medications.   Hyperlipidemia      Component Value Date/Time   CHOL 135 11/19/2019 1450   TRIG 210.0 (H) 11/19/2019 1450   HDL 44.80 11/19/2019 1450   CHOLHDL 3 11/19/2019 1450   VLDL 42.0 (H) 11/19/2019 1450   LDLCALC 61 05/25/2019 1352   LDLDIRECT 65.0 11/19/2019 1450   The 10-year ASCVD risk score Denman George DC Jr., et al., 2013) is: 17.8%   Values used to calculate the score:     Age: 31 years     Sex: Female     Is Non-Hispanic African American: No     Diabetic: Yes     Tobacco smoker: No     Systolic Blood Pressure: 138 mmHg     Is BP treated: Yes     HDL Cholesterol: 44.8 mg/dL     Total Cholesterol: 135 mg/dL   01/1855 labs - LDL 65, goal of 100. 04/2020 labs - TG mildly elevated, LDL <70. We discussed:  diet and exercise extensively. Denies any muscle or abdominal pain or n/v. Patient is currently controlled on the following medications:   Atorvastatin 40 mg daily  Fish oil 1000 mg daily  Plan Continue current medications and control with diet and exercise.  Depression   Patient is currently controlled on sertraline  50 mg daily. Reports continued benefit with current regimen. Denies any side effects at this time. phq 2/9: 0.   Plan Continue current medication.   Vaccines   Reviewed and discussed patient's vaccination history.  Due for Shingrix, TDAP. Cost is prohibitive. Would need to meet out of pocket spending limit to qualify for vaccine PAP.  Immunization History  Administered Date(s) Administered  . Fluad Quad(high Dose 65+) 02/16/2019  . Influenza,inj,Quad PF,6+ Mos 04/24/2016, 03/04/2017, 03/20/2018  . Pneumococcal Conjugate-13 07/06/2016  . Pneumococcal Polysaccharide-23 07/01/2017  . Tdap 09/23/2009   Plan Recommended patient receive Shingrix vaccine in office/pharmacy.   Medication Management   Pt uses [facility] for all medications  Uses pill box? Yes.  Plan Continue  current medication management strategy.  Follow up: 3 month phone visit ______________ Visit Information SDOH (Social Determinants of Health) assessments performed: Yes.   Food Insecurity: No Food Insecurity  . Worried About Programme researcher, broadcasting/film/videounning Out of Food in the Last Year: Never true  . Ran Out of Food in the Last Year: Never true     Transportation Needs: No Transportation Needs  . Lack of Transportation (Medical): No  . Lack of Transportation (Non-Medical): No   Kayla Brown was given information about Chronic Care Management services today including:  1. CCM service includes personalized support from designated clinical staff supervised by her physician, including individualized plan of care and coordination with other care providers 2. 24/7 contact phone numbers for assistance for urgent and routine care needs. 3. Standard insurance, coinsurance, copays and deductibles apply for chronic care management only during months in which we provide at least 20 minutes of these services. Most insurances cover these services at 100%, however patients may be responsible for any copay, coinsurance and/or deductible if applicable. This service may help you avoid the need for more expensive face-to-face services. 4. Only one practitioner may furnish and bill the service in a calendar month. 5. The patient may stop CCM services at any time (effective at the end of the month) by phone call to the office staff.  Patient agreed to services and verbal consent obtained.   Dahlia ByesJacob Renatha Rosen, Pharm.D., BCGP Clinical Pharmacist [facility] [phone]

## 2019-12-01 ENCOUNTER — Other Ambulatory Visit: Payer: Self-pay

## 2019-12-01 ENCOUNTER — Encounter: Payer: Self-pay | Admitting: Orthopaedic Surgery

## 2019-12-01 ENCOUNTER — Ambulatory Visit: Payer: Self-pay

## 2019-12-01 ENCOUNTER — Ambulatory Visit (INDEPENDENT_AMBULATORY_CARE_PROVIDER_SITE_OTHER): Payer: Medicare Other | Admitting: Orthopaedic Surgery

## 2019-12-01 VITALS — Ht 60.0 in | Wt 178.0 lb

## 2019-12-01 DIAGNOSIS — M25552 Pain in left hip: Secondary | ICD-10-CM | POA: Diagnosis not present

## 2019-12-01 NOTE — Progress Notes (Signed)
Office Visit Note   Patient: Kayla Brown           Date of Birth: January 02, 1951           MRN: [id number] Visit Date: 12/01/2019              Requested by: [name], MD [address] [place],  [place] [address] PCP: [name], MD   Assessment & Plan: Visit Diagnoses:  1. Left hip pain     Plan: Impression is left sacroiliitis versus referred pain from low back.  After discussion she agreed to try a left SI joint injection today.  She will pay close attention to her response to this injection.  If she fails to get any relief we may need to consider obtaining x-rays of the pelvis and lumbar spine.  Follow-Up Instructions: Return if symptoms worsen or fail to improve.   Orders:  Orders Placed This Encounter  Procedures  . US Guided Needle Placement - No Linked Charges   No orders of the defined types were placed in this encounter.     Procedures: No procedures performed   Clinical Data: No additional findings.   Subjective: Chief Complaint  Patient presents with  . Left Hip - Pain    Kayla Brown is a 69 year old female here for evaluation of posterior left hip and buttock pain for 2 months.  Denies any injuries or groin pain or radicular symptoms.  Denies any back pain.  Walking makes it worse.  She has been taking Tylenol.   Review of Systems  Constitutional: Negative.   HENT: Negative.   Eyes: Negative.   Respiratory: Negative.   Cardiovascular: Negative.   Endocrine: Negative.   Musculoskeletal: Negative.   Neurological: Negative.   Hematological: Negative.   Psychiatric/Behavioral: Negative.   All other systems reviewed and are negative.    Objective: Vital Signs: Ht 5' (1.524 m)   Wt 178 lb (80.7 kg)   LMP 06/25/2008   BMI 34.76 kg/m   Physical Exam Vitals and nursing note reviewed.  Constitutional:      Appearance: She is well-developed.  HENT:     Head: Normocephalic and atraumatic.  Pulmonary:     Effort: Pulmonary  effort is normal.  Abdominal:     Palpations: Abdomen is soft.  Musculoskeletal:     Cervical back: Neck supple.  Skin:    General: Skin is warm.     Capillary Refill: Capillary refill takes less than 2 seconds.  Neurological:     Mental Status: She is alert and oriented to person, place, and time.  Psychiatric:        Behavior: Behavior normal.        Thought Content: Thought content normal.        Judgment: Judgment normal.     Ortho Exam Left hip exam shows no tenderness of the lumbar spine or the trochanter, or the ischial tuberosity or proximal hamstring.  No sciatic tension signs.  She is tender over the left SI joint. Specialty Comments:  No specialty comments available.  Imaging: US Guided Needle Placement - No Linked Charges  Result Date: 12/01/2019 Ultrasound-guided left SI injection: After sterile prep with Betadine, injected 5 cc 1% lidocaine without epinephrine and 40 mg methylprednisolone using a 22-gauge spinal needle, passing the needle through the sacroiliac ligament into the region of the SI joint.  Follow-up as directed.     PMFS History: Patient Active Problem List   Diagnosis Date Noted  .  Left hip pain 12/01/2019  . Type 2 diabetes mellitus with stage 3b chronic kidney disease, without long-term current use of insulin (HCC) 04/03/2019  . Diastolic CHF (HCC) 03/18/2019  . Dyspnea 03/08/2019  . Pulmonary edema 03/04/2019  . Physical exam 02/06/2017  . Anxiety and depression 12/05/2016  . Morbid obesity (HCC) 03/01/2016  . OSA (obstructive sleep apnea) 07/11/2015  . Pulmonary hypertension (HCC) 05/06/2015  . Heart murmur 03/03/2015  . Essential hypertension, benign 03/18/2014  . Hyperlipidemia 03/18/2014  . Post-menopause on HRT (hormone replacement therapy) 03/18/2014   Past Medical History:  Diagnosis Date  . Anxiety   . Diabetes mellitus   . Edema of extremities 03/02/2016  . Essential hypertension, benign   . Lazy eye   . Major depressive  disorder, single episode, unspecified   . Obesity (BMI 30-39.9) 03/01/2016  . OSA (obstructive sleep apnea)   . Pulmonary HTN (HCC)    mild with PASP by echo 05/2016  . Pure hypercholesterolemia   . Retinopathy, due to hypertension     Family History  Adopted: Yes  Problem Relation Age of Onset  . Alcohol abuse Mother   . ADD / ADHD Mother   . Dementia Mother   . Breast cancer Neg Hx     Past Surgical History:  Procedure Laterality Date  . CPAP TITRATION  10/14/2015  . DILATION AND CURETTAGE OF UTERUS  2005-2008   x2  . EYE SURGERY     cataracts  . WISDOM TOOTH EXTRACTION     Social History   Occupational History  . Not on file  Tobacco Use  . Smoking status: Never Smoker  . Smokeless tobacco: Never Used  Substance and Sexual Activity  . Alcohol use: No    Alcohol/week: 0.0 standard drinks  . Drug use: No  . Sexual activity: Yes    Partners: Male    Birth control/protection: None

## 2019-12-01 NOTE — Progress Notes (Signed)
Subjective: Patient is here for ultrasound-guided intra-articular left SI joint injection.   This is a diagnostic/therapeutic injection, she has been having pain in the left posterior hip while walking.  Objective: She does have some tenderness over the left SI joint but is not severe today.  Procedure: Ultrasound-guided left SI injection: After sterile prep with Betadine, injected 5 cc 1% lidocaine without epinephrine and 40 mg methylprednisolone using a 22-gauge spinal needle, passing the needle through the sacroiliac ligament into the region of the SI joint.  Follow-up as directed.

## 2019-12-06 ENCOUNTER — Other Ambulatory Visit: Payer: Self-pay | Admitting: Family Medicine

## 2019-12-07 ENCOUNTER — Other Ambulatory Visit: Payer: Self-pay | Admitting: Family Medicine

## 2019-12-09 ENCOUNTER — Other Ambulatory Visit: Payer: Self-pay | Admitting: Family Medicine

## 2019-12-09 DIAGNOSIS — Z1231 Encounter for screening mammogram for malignant neoplasm of breast: Secondary | ICD-10-CM

## 2019-12-10 ENCOUNTER — Other Ambulatory Visit: Payer: Self-pay | Admitting: *Deleted

## 2019-12-10 NOTE — Patient Outreach (Signed)
[facility]) Care Management  12/10/2019  Tina Gruner 01-24-1951 [id number]   Telephone Assessment-Unsuccessful  RN attempted outreach call however unsuccessful. RN able to leave a HIPAA approved voice message requesting a call back.  Plan: Will follow up with another outreach call next week for ongoing [facility] services.  Elliot Cousin, RN Care Management Coordinator [facility] Main Office [phone]

## 2019-12-14 ENCOUNTER — Ambulatory Visit (INDEPENDENT_AMBULATORY_CARE_PROVIDER_SITE_OTHER): Payer: Medicare Other | Admitting: Internal Medicine

## 2019-12-14 ENCOUNTER — Other Ambulatory Visit: Payer: Self-pay

## 2019-12-14 ENCOUNTER — Encounter: Payer: Self-pay | Admitting: Internal Medicine

## 2019-12-14 VITALS — BP 132/80 | HR 70 | Temp 97.3°F | Ht 60.0 in | Wt 182.4 lb

## 2019-12-14 DIAGNOSIS — I5032 Chronic diastolic (congestive) heart failure: Secondary | ICD-10-CM

## 2019-12-14 DIAGNOSIS — G4733 Obstructive sleep apnea (adult) (pediatric): Secondary | ICD-10-CM | POA: Diagnosis not present

## 2019-12-14 NOTE — Progress Notes (Signed)
HPI F never smoker followed for OSA, Dyspnea improved after treatment for heart failure. Complicated by anxiety and depression, DM2, HBP, Hyperlipidemia, Morbid obesity, OSA, Pulmonary Hypertension(PASP 37mmHg by echo 02/2015),   NPSG 05/14/16 AHI 93/ hr, desaturation to 84% PFT 04/04/15   - mild Diffusion defect, normal flows and volumes without response to bronchodilator PFT 04/24/2019- minimal obst w airtrapping, sl resp to BD, Nl DLCO 2D echo February 02, 2019 EF 10-25%, normal systolic function right ventricle, mild to moderately dilated left atrium, RV systolic function appeared to be normal with normal RV size 2D echo March 06, 2019 EF greater than 65%, moderate LVH, right ventricle normal systolic function, right ventricle systolic pressure could not be assessed. CT chest March 05, 2019 extensive atelectatic changes throughout the lungs with a dependent predominance, mosaic attenuation throughout the lungs, emphysema VQ scan 03/04/2019 no unmatched segmental perfusion defects to suggest acute PE Autoimmune work-up negative 2020 , SH /Occupational hx unrevealing  ------------------------------------------------------------------------------------------   06/15/2019-  69 yoF never smoker followed for OSA, Dyspnea improved after treatment for heart failure. Complicated by anxiety and depression, DM2, HBP, Hyperlipidemia, Morbid obesity, OSA, Pulmonary Hypertension(PASP 38mmHg by echo 02/2015),   Had seen NP the last 2 visits, doing better.  CPAP / [facility]- - reports using every night. No download.  Not using Anoro. Asks refill rescue hfa.  PFT 04/24/2019- minimal obst w airtrapping, sl resp to BD, Nl DLCO CT chest 03/05/2019-  IMPRESSION: 1. Extensive atelectatic changes are present throughout the lungs with a dependent predominance. 2. Redemonstration of mosaic attenuation throughout the lungs superimposed on centrilobular and paraseptal emphysema, suggestive of small airways  disease. 3. Mild bilateral symmetric perinephric stranding, a nonspecific finding though may correlate with either age or decreased renal function. 4. Aortic Atherosclerosis (ICD10-I70.0) and Emphysema (ICD10-J43.9).  // consider ONOX due to PHTN//  12/14/19- 68 yoF never smoker followed for OSA, Dyspnea improved after treatment for heart failure. Complicated by anxiety and depression, DM2, HBP, Hyperlipidemia, Morbid obesity, Pulmonary Hypertension(PASP 76mmHg by echo 02/2015), CPAP / [facility] Body weight today 182 lbs Download- not available Proair hfa CPAP machine is about 68 years old. She does well with it and reports good compliance and control. She wants [place] to manage her sleep apnea.  Had 2 Phizer Covax Paces herself outside, without need to usee her Proair rescue inhaler and no significant exacerbations, wheeze or cough.   ROS-see HPI   + = positive Constitutional:    weight loss, night sweats, fevers, chills, fatigue, lassitude. HEENT:    headaches, difficulty swallowing, tooth/dental problems, sore throat,       sneezing, itching, ear ache, nasal congestion, post nasal drip, snoring CV:    chest pain, orthopnea, PND, swelling in lower extremities, anasarca,                                   dizziness, palpitations Resp:   +shortness of breath with exertion or at rest.                productive cough,   non-productive cough, coughing up of blood.              change in color of mucus.  wheezing.   Skin:    rash or lesions. GI:  No-   heartburn, indigestion, abdominal pain, nausea, vomiting, diarrhea,                 change  in bowel habits, loss of appetite GU: dysuria, change in color of urine, no urgency or frequency.   flank pain. MS:   joint pain, stiffness, decreased range of motion, back pain. Neuro-     nothing unusual Psych:  change in mood or affect.  depression or anxiety.   memory loss.  OBJ- Physical Exam General- Alert, Oriented, Affect-appropriate, Distress- none  acute, + obese Skin- rash-none, lesions- none, excoriation- none Lymphadenopathy- none Head- atraumatic            Eyes- +strabismus            Ears- Hearing, canals-normal            Nose- Clear, no-Septal dev, mucus, polyps, erosion, perforation             Throat- Mallampati II , mucosa clear , drainage- none, tonsils- atrophic Neck- flexible , trachea midline, no stridor , thyroid nl, carotid no bruit Chest - symmetrical excursion , unlabored           Heart/CV- RRR , + trace S murmur , no gallop  , no rub, nl s1 s2                           - JVD- none , edema- none, stasis changes- none, varices- none           Lung- +trace crackle L base, wheeze- none, cough- none , dullness-none, rub- none           Chest wall-  Abd-  Br/ Gen/ Rectal- Not done, not indicated Extrem- cyanosis- none, clubbing, none, atrophy- none, strength- nl Neuro- grossly intact to observation

## 2019-12-14 NOTE — Patient Instructions (Signed)
Order- DME [facility]- I will manage CPAP now. Please continue current settings, mask of choice, humidifier, supplies,  AirView/ card-  Please provide current download   Please call if we can help

## 2019-12-15 ENCOUNTER — Other Ambulatory Visit: Payer: Self-pay | Admitting: *Deleted

## 2019-12-15 NOTE — Patient Outreach (Signed)
[facility]) Care Management  12/15/2019  Kayla Brown June 20, 1951 [id number]   Telephone Assessment-Unsuccessful  RN attempted outreach call however unsuccessful. RN able to leave a HIPAA approved voice message requesting a call back.   Plan: Will further review and update plan of care accordingly.  Elliot Cousin, RN Care Management Coordinator [facility] Main Office [phone]

## 2019-12-22 ENCOUNTER — Other Ambulatory Visit: Payer: Self-pay | Admitting: *Deleted

## 2019-12-22 NOTE — Patient Outreach (Signed)
[facility]) Care Management  12/22/2019  Triston Lisanti 1951-05-11 [id number]  Telephone Assessment-Unsuccessful  RN attempted outreach call today however remains unsuccessful (3rd call). RN able to leave a HIPPA approved voice message requesting a call back. Note outreach letter was sent out with no response.  PLAN: Will close this case if no response per protocol this week.    Elliot Cousin, RN Care Management Coordinator [facility] Main Office [phone]

## 2019-12-22 NOTE — Patient Outreach (Signed)
[facility]) Care Management  12/22/2019  Kayla Brown [birth year] [id number]   Telephone Assessment-Discipline Closure  RN spoke with pt today with update on her ongoing management of care. Pt reports she continue to eat right and weight daily with no symptoms related to acute HF symptoms. Pt verified she remains in the GREEN zone with no problems and continue to take all her medications and attend all medical appointments with no problems. Pt denies any fluid retention with her daily weights.  Based upon pt's progress with most goals met and RN discussed the current plan and adjusted intervention accordingly will refer to a Health Coach for quarterly follows up however pt "okay" with monthly calls. No other inquires or request at this time. Case will be referred to a Health Coach and update to her provider on pt's disposition with [facility].  [facility] CM Care Plan Problem One     Most Recent Value  Care Plan Problem One Knowledge Deficit realted to CHF exacerbation  Role Documenting the Problem One Care Management Coordinator  Care Plan for Problem One Active  [facility] Long Term Goal  Pt will verbalize two symptoms of CHF exacerbation within the next 90 days.  [facility] Long Term Goal Start Date 03/27/19  Interventions for Problem One Long Term Goal Pt able to mention some symptoms and verified she remains in the GREEN zone. Will continue to encourage adherence with her ongong management of care and the importance of staying in the GREEN zone.       Kayla Mina, RN Care Management Coordinator [facility] Office [phone]

## 2019-12-24 ENCOUNTER — Telehealth: Payer: Self-pay | Admitting: Internal Medicine

## 2019-12-24 NOTE — Telephone Encounter (Signed)
That note has been completed

## 2019-12-24 NOTE — Assessment & Plan Note (Addendum)
Benefits from CPAP with improved sleep. We need download for settings and documentation. Plan- Continue CPAP with current settings.

## 2019-12-24 NOTE — Assessment & Plan Note (Signed)
Managed by Dr [place], cardiology. No acute decompensation.

## 2019-12-24 NOTE — Telephone Encounter (Signed)
Hello,  Per our cpap processing team we ae in need of the following items to process this order: We need an addendum added to the notes that mention the pt benefiting from the use of the cpap therapy. Thank you!

## 2019-12-24 NOTE — Telephone Encounter (Signed)
Dr. Maple Hudson, we need an addendum added to the notes that mention the pt benefiting from the use of the cpap therapy. Thank you!

## 2019-12-29 NOTE — Telephone Encounter (Signed)
Message has been sent to [facility] 

## 2019-12-30 ENCOUNTER — Encounter: Payer: Self-pay | Admitting: Physician Assistant

## 2019-12-30 ENCOUNTER — Other Ambulatory Visit: Payer: Self-pay | Admitting: *Deleted

## 2019-12-30 ENCOUNTER — Ambulatory Visit (INDEPENDENT_AMBULATORY_CARE_PROVIDER_SITE_OTHER): Payer: Medicare Other | Admitting: Physician Assistant

## 2019-12-30 ENCOUNTER — Other Ambulatory Visit: Payer: Self-pay

## 2019-12-30 VITALS — BP 120/70 | HR 71 | Temp 98.2°F | Resp 16 | Ht 60.0 in | Wt 180.0 lb

## 2019-12-30 DIAGNOSIS — M5442 Lumbago with sciatica, left side: Secondary | ICD-10-CM

## 2019-12-30 MED ORDER — MELOXICAM 15 MG PO TABS: 15.0000 mg | ORAL_TABLET | Freq: Every day | ORAL | 0 refills | Status: DC

## 2019-12-30 MED ORDER — CYCLOBENZAPRINE HCL 10 MG PO TABS: 10.0000 mg | ORAL_TABLET | Freq: Every day | ORAL | 0 refills | Status: DC

## 2019-12-30 NOTE — Patient Instructions (Addendum)
Please avoid heavy lifting and overexertion.  Apply heating pad to the lower back for 10-15 minutes at a time.  Take the Meloxicam once daily with food.  Tylenol for breakthrough pain.  Use the Flexeril in the evening to help calm muscles down.  Can use up to 3 x daily if needed but cannot drive or operate heavy machinery while on the medication.  Follow-up if not resolving over the next week, if anything worsens or new symptoms develop.

## 2019-12-30 NOTE — Progress Notes (Signed)
Patient presents to clinic today c/o left-sided low back pain starting Sunday.  Denies any noted trauma or injury but does note she walked a bit more than usual Saturday evening.  Notes the pain is solely in left lumbar region but will radiate down her leg.  Denies any associated numbness, tingling or weakness.  Denies any change in bowel or bladder habits.  Denies saddle anesthesia.  No she has a history of sciatica before and this feels very similar to her.  Has been taking Tylenol over-the-counter with only some relief.  Has been using her walker regularly. Past Medical History:  Diagnosis Date  . Anxiety   . Diabetes mellitus   . Edema of extremities 03/02/2016  . Essential hypertension, benign   . Lazy eye   . Major depressive disorder, single episode, unspecified   . Obesity (BMI 30-39.9) 03/01/2016  . OSA (obstructive sleep apnea)   . Pulmonary HTN ([place])    mild with PASP 41mHg by echo 05/2016  . Pure hypercholesterolemia   . Retinopathy, due to hypertension     Current Outpatient Medications on File Prior to Visit  Medication Sig Dispense Refill  . albuterol (PROAIR HFA) 108 (90 Base) MCG/ACT inhaler Inhale 1-2 puffs into the lungs every 6 (six) hours as needed for wheezing or shortness of breath. 18 g 3  . Alcohol Swabs (ALCOHOL PREP) PADS Pt uses an alcohol pad each time sugars are tested. Pt tests twice daily. Dx E11.9 100 each 3  . amLODipine (NORVASC) 10 MG tablet TAKE 1 TABLET BY MOUTH EVERY DAY 90 tablet 1  . atorvastatin (LIPITOR) 40 MG tablet TAKE 1 TABLET BY MOUTH EVERY DAY 90 tablet 1  . carvedilol (COREG) 6.25 MG tablet Take 2 tablets in the morning and 1 1/2(9.375 mg) tablet at bedtime 315 tablet 3  . Cholecalciferol (VITAMIN D PO) Take 2,000 Units by mouth daily.     . fish oil-omega-3 fatty acids 1000 MG capsule Take 1 g by mouth daily.    . fluticasone (FLONASE) 50 MCG/ACT nasal spray SPRAY 2 SPRAYS INTO EACH NOSTRIL EVERY DAY (Patient taking differently: Place 2  sprays into both nostrils daily. ) 18 g 1  . furosemide (LASIX) 20 MG tablet TAKE 1 TABLET BY MOUTH EVERY DAY 90 tablet 1  . glipiZIDE (GLUCOTROL) 5 MG tablet Take 1 tablet (5 mg total) by mouth daily before breakfast AND 0.5 tablets (2.5 mg total) daily before supper. 135 tablet 3  . glucose blood (FREESTYLE LITE) test strip USE TO TEST BLOOD GLUCOSE 2 TIMES DAILY. Dx E11.9 100 each 12  . loratadine (CLARITIN) 10 MG tablet TAKE 1 TABLET BY MOUTH EVERY DAY (Patient taking differently: Take 10 mg by mouth daily. ) 30 tablet 11  . Melatonin 3 MG TABS Take 10 mg by mouth at bedtime.     . metFORMIN (GLUCOPHAGE-XR) 500 MG 24 hr tablet Take 2 tablets (1,000 mg total) by mouth in the morning and at bedtime. 360 tablet 3  . Multiple Vitamins-Minerals (VISION FORMULA) TABS Take 1 tablet by mouth daily.     .Marland KitchenOVER THE COUNTER MEDICATION Take 1 tablet by mouth daily. Equate Vision Formula 50+     . sertraline (ZOLOFT) 50 MG tablet TAKE 1 TABLET BY MOUTH EVERY DAY 90 tablet 2  . TRUEPLUS LANCETS 30G MISC Use one lancet each time sugars are tested. Pt tests twice daily. Dx. E11.9 100 each 3  . UNABLE TO FIND Med Name: Healthwise talking meter 1 kit  0   No current facility-administered medications on file prior to visit.    No Known Allergies  Family History  Adopted: Yes  Problem Relation Age of Onset  . Alcohol abuse Mother   . ADD / ADHD Mother   . Dementia Mother   . Breast cancer Neg Hx     Social History   Socioeconomic History  . Marital status: Married    Spouse name: Not on file  . Number of children: Not on file  . Years of education: Not on file  . Highest education level: Not on file  Occupational History  . Not on file  Tobacco Use  . Smoking status: Never Smoker  . Smokeless tobacco: Never Used  Vaping Use  . Vaping Use: Never used  Substance and Sexual Activity  . Alcohol use: No    Alcohol/week: 0.0 standard drinks  . Drug use: No  . Sexual activity: Yes    Partners:  Male    Birth control/protection: None  Other Topics Concern  . Not on file  Social History Narrative  . Not on file   Social Determinants of Health   Financial Resource Strain:   . Difficulty of Paying Living Expenses:   Food Insecurity: No Food Insecurity  . Worried About Charity fundraiser in the Last Year: Never true  . Ran Out of Food in the Last Year: Never true  Transportation Needs: No Transportation Needs  . Lack of Transportation (Medical): No  . Lack of Transportation (Non-Medical): No  Physical Activity:   . Days of Exercise per Week:   . Minutes of Exercise per Session:   Stress:   . Feeling of Stress :   Social Connections:   . Frequency of Communication with Friends and Family:   . Frequency of Social Gatherings with Friends and Family:   . Attends Religious Services:   . Active Member of Clubs or Organizations:   . Attends Archivist Meetings:   [name] Marital Status:     Review of Systems - See HPI.  All other ROS are negative.  Wt 180 lb (81.6 kg)   LMP 06/25/2008   BMI 35.15 kg/m   Physical Exam Vitals reviewed.  Constitutional:      Appearance: Normal appearance.  HENT:     Head: Normocephalic and atraumatic.  Cardiovascular:     Rate and Rhythm: Normal rate and regular rhythm.  Pulmonary:     Breath sounds: Normal breath sounds.  Musculoskeletal:     Cervical back: Normal and neck supple.     Thoracic back: Normal.     Lumbar back: Spasms and tenderness (Positive tenderness of the left lumbar paraspinal musculature with mild spasm noted) present. No bony tenderness. Normal range of motion.     Comments: Unable to perform straight leg raise due to patient mobility.  Neurological:     Mental Status: She is alert.     Recent Results (from the past 2160 hour(s))  Basic Metabolic Panel (BMET)     Status: Abnormal   Collection Time: 11/09/19  2:33 PM  Result Value Ref Range   Glucose 108 (H) 65 - 99 mg/dL   BUN 29 (H) 8 - 27 mg/dL    Creatinine, Ser 1.17 (H) 0.57 - 1.00 mg/dL   GFR calc non Af Amer 48 (L) >59 mL/min/1.73   GFR calc Af Amer 56 (L) >59 mL/min/1.73    Comment: **[facility] currently reports eGFR in compliance with the current**  recommendations of the [employer]. [facility] will   update reporting as new guidelines are published from the NKF-ASN   Task force.    BUN/Creatinine Ratio 25 12 - 28   Sodium 143 134 - 144 mmol/L   Potassium 4.8 3.5 - 5.2 mmol/L   Chloride 104 96 - 106 mmol/L   CO2 24 20 - 29 mmol/L   Calcium 10.8 (H) 8.7 - 10.3 mg/dL  Hemoglobin A1c     Status: Abnormal   Collection Time: 11/19/19  2:50 PM  Result Value Ref Range   Hgb A1c MFr Bld 7.3 (H) 4.6 - 6.5 %    Comment: Glycemic Control Guidelines for People with Diabetes:Non Diabetic:  <6%Goal of Therapy: <7%Additional Action Suggested:  >8%   Lipid panel     Status: Abnormal   Collection Time: 11/19/19  2:50 PM  Result Value Ref Range   Cholesterol 135 0 - 200 mg/dL    Comment: ATP III Classification       Desirable:  < 200 mg/dL               Borderline High:  200 - 239 mg/dL          High:  > = 240 mg/dL   Triglycerides 210.0 (H) 0 - 149 mg/dL    Comment: Normal:  <150 mg/dLBorderline High:  150 - 199 mg/dL   HDL 44.80 >39.00 mg/dL   VLDL 42.0 (H) 0.0 - 40.0 mg/dL   Total CHOL/HDL Ratio 3     Comment:                Men          Women1/2 Average Risk     3.4          3.3Average Risk          5.0          4.42X Average Risk          9.6          7.13X Average Risk          15.0          11.0                       NonHDL 90.59     Comment: NOTE:  Non-HDL goal should be 30 mg/dL higher than patient's LDL goal (i.e. LDL goal of < 70 mg/dL, would have non-HDL goal of < 100 mg/dL)  TSH     Status: None   Collection Time: 11/19/19  2:50 PM  Result Value Ref Range   TSH 1.40 0.35 - 4.50 uIU/mL  Hepatic function panel     Status: None   Collection Time: 11/19/19  2:50 PM  Result Value Ref Range   Total Bilirubin 0.3  0.2 - 1.2 mg/dL   Bilirubin, Direct 0.1 0.0 - 0.3 mg/dL   Alkaline Phosphatase 64 39 - 117 U/L   AST 13 0 - 37 U/L   ALT 16 0 - 35 U/L   Total Protein 6.9 6.0 - 8.3 g/dL   Albumin 4.5 3.5 - 5.2 g/dL  CBC with Differential/Platelet     Status: Abnormal   Collection Time: 11/19/19  2:50 PM  Result Value Ref Range   WBC 10.4 4.0 - 10.5 K/uL   RBC 4.18 3.87 - 5.11 Mil/uL   Hemoglobin 12.7 12.0 - 15.0 g/dL   HCT 37.5 36 - 46 %   MCV 89.9  78.0 - 100.0 fl   MCHC 33.9 30.0 - 36.0 g/dL   RDW 15.2 11.5 - 15.5 %   Platelets 294.0 150 - 400 K/uL   Neutrophils Relative % 56.7 43 - 77 %   Lymphocytes Relative 22.5 12 - 46 %   Monocytes Relative 9.1 3 - 12 %   Eosinophils Relative 11.0 (H) 0 - 5 %   Basophils Relative 0.7 0 - 3 %   Neutro Abs 5.9 1.4 - 7.7 K/uL   Lymphs Abs 2.3 0.7 - 4.0 K/uL   Monocytes Absolute 0.9 0 - 1 K/uL   Eosinophils Absolute 1.1 (H) 0 - 0 K/uL   Basophils Absolute 0.1 0 - 0 K/uL  Basic metabolic panel     Status: Abnormal   Collection Time: 11/19/19  2:50 PM  Result Value Ref Range   Sodium 140 135 - 145 mEq/L   Potassium 4.2 3.5 - 5.1 mEq/L   Chloride 104 96 - 112 mEq/L   CO2 26 19 - 32 mEq/L   Glucose, Bld 179 (H) 70 - 99 mg/dL   BUN 30 (H) 6 - 23 mg/dL   Creatinine, Ser 1.08 0.40 - 1.20 mg/dL   GFR 50.50 (L) >60.00 mL/min   Calcium 10.2 8.4 - 10.5 mg/dL  LDL cholesterol, direct     Status: None   Collection Time: 11/19/19  2:50 PM  Result Value Ref Range   Direct LDL 65.0 mg/dL    Comment: Optimal:  <100 mg/dLNear or Above Optimal:  100-129 mg/dLBorderline High:  130-159 mg/dLHigh:  160-189 mg/dLVery High:  >190 mg/dL    Assessment/Plan: 1. Acute left-sided low back pain with left-sided sciatica In the absence of trauma.  No alarm signs or symptoms present.  Examination somewhat limited due to patient mobility and risk of fall.  History and examination are consistent with sciatica along with some mild muscular tenderness and spasm of left lumbar region.   Patient is diabetic and has significant cardiac history.  As such want to avoid steroids if at all possible.  Will Rx meloxicam once daily with food for short duration.  Tylenol for breakthrough pain.  Recommended heating pad to the area.  Stretches reviewed.  Rx cyclobenzaprine to use in the evening to help relax the muscle.  Would have preferred to send in Robaxin as it is less sedating but was not covered by insurance and cost is a huge factor for patient.  Patient endorses she is taken before and tolerates well.  Strict return precautions reviewed with patient who voiced understanding and agreement with the plan.  Written copy of instructions was provided for the patient.  - meloxicam (MOBIC) 15 MG tablet; Take 1 tablet (15 mg total) by mouth daily.  Dispense: 15 tablet; Refill: 0 - cyclobenzaprine (FLEXERIL) 10 MG tablet; Take 1 tablet (10 mg total) by mouth at bedtime.  Dispense: 15 tablet; Refill: 0  This visit occurred during the SARS-CoV-2 public health emergency.  Safety protocols were in place, including screening questions prior to the visit, additional usage of staff PPE, and extensive cleaning of exam room while observing appropriate contact time as indicated for disinfecting solutions.     Leeanne Rio, PA-C

## 2020-01-05 ENCOUNTER — Telehealth: Payer: Self-pay

## 2020-01-05 NOTE — Telephone Encounter (Signed)
Patient called stating that she is having left hip pain that is radiating down into her left side and left ankle.  Patient would like to be worked in Wednesday, 01/06/2020.  CB# [phone].  Please advise.  Thank you.

## 2020-01-05 NOTE — Telephone Encounter (Signed)
Called patient no answer. LMOM.  Appt made for tomorrow.

## 2020-01-06 ENCOUNTER — Ambulatory Visit: Payer: Medicare Other | Admitting: Physician Assistant

## 2020-01-06 ENCOUNTER — Ambulatory Visit (INDEPENDENT_AMBULATORY_CARE_PROVIDER_SITE_OTHER): Payer: Medicare Other | Admitting: Orthopaedic Surgery

## 2020-01-06 ENCOUNTER — Ambulatory Visit (INDEPENDENT_AMBULATORY_CARE_PROVIDER_SITE_OTHER): Payer: Medicare Other

## 2020-01-06 ENCOUNTER — Encounter: Payer: Self-pay | Admitting: Orthopaedic Surgery

## 2020-01-06 VITALS — Ht 60.5 in | Wt 183.4 lb

## 2020-01-06 DIAGNOSIS — M25552 Pain in left hip: Secondary | ICD-10-CM

## 2020-01-06 DIAGNOSIS — M545 Low back pain, unspecified: Secondary | ICD-10-CM

## 2020-01-06 MED ORDER — PREDNISONE 10 MG (21) PO TBPK
ORAL_TABLET | ORAL | 0 refills | Status: DC
Start: 2020-01-06 — End: 2020-05-23

## 2020-01-06 MED ORDER — TRAMADOL HCL 50 MG PO TABS: 50.0000 mg | ORAL_TABLET | Freq: Every day | ORAL | 0 refills | Status: DC | PRN

## 2020-01-06 NOTE — Progress Notes (Signed)
Office Visit Note   Patient: Kayla Brown           Date of Birth: 1951/02/15           MRN: [id number] Visit Date: 01/06/2020              Requested by: Sheliah Hatch, MD [address] [place],  [place] [address] PCP: Sheliah Hatch, MD   Assessment & Plan: Visit Diagnoses:  1. Pain in left hip   2. Low back pain, unspecified back pain laterality, unspecified chronicity, unspecified whether sciatica present     Plan: Impression is lumbar radiculopathy to the left leg.  She has a grade 2 almost grade 3 spondylolisthesis of L5-S1 on x-rays today.  Given these findings she will need an MRI of the lumbar spine for complete evaluation and possible surgical planning.  In the meantime I have sent in a prescription for prednisone Dosepak and tramadol.  I will call the patient with the MRI results.  Follow-Up Instructions: Return if symptoms worsen or fail to improve.   Orders:  Orders Placed This Encounter  Procedures  . XR HIP UNILAT W OR W/O PELVIS 2-3 VIEWS LEFT  . XR Lumbar Spine 2-3 Views   Meds ordered this encounter  Medications  . predniSONE (STERAPRED UNI-PAK 21 TAB) 10 MG (21) TBPK tablet    Sig: Take as directed    Dispense:  21 tablet    Refill:  0  . traMADol (ULTRAM) 50 MG tablet    Sig: Take 1-2 tablets (50-100 mg total) by mouth daily as needed.    Dispense:  20 tablet    Refill:  0      Procedures: No procedures performed   Clinical Data: No additional findings.   Subjective: Chief Complaint  Patient presents with  . Left Hip - Pain  . Lower Back - Pain    Kayla Brown returns today for severe left hip and low back pain.  The left SI joint injection did not give her any relief.  She is now having constant and severe radiating pain down into her left leg.  Meloxicam and Flexeril are not helping.  She denies any bowel or bladder dysfunction or focal weakness.  She only has pain.   Review of Systems  Constitutional: Negative.   HENT:  Negative.   Eyes: Negative.   Respiratory: Negative.   Cardiovascular: Negative.   Endocrine: Negative.   Musculoskeletal: Negative.   Neurological: Negative.   Hematological: Negative.   Psychiatric/Behavioral: Negative.   All other systems reviewed and are negative.    Objective: Vital Signs: Ht 5' 0.5" (1.537 m)   Wt 183 lb 6.4 oz (83.2 kg)   LMP 06/25/2008   BMI 35.23 kg/m   Physical Exam Vitals and nursing note reviewed.  Constitutional:      Appearance: She is well-developed.  Pulmonary:     Effort: Pulmonary effort is normal.  Skin:    General: Skin is warm.     Capillary Refill: Capillary refill takes less than 2 seconds.  Neurological:     Mental Status: She is alert and oriented to person, place, and time.  Psychiatric:        Behavior: Behavior normal.        Thought Content: Thought content normal.        Judgment: Judgment normal.     Ortho Exam Left lower extremity exam is unchanged. Specialty Comments:  No specialty comments available.  Imaging: XR  HIP UNILAT W OR W/O PELVIS 2-3 VIEWS LEFT  Result Date: 01/06/2020 Moderate osteoarthritis appropriate for age.  No significant degenerative changes  XR Lumbar Spine 2-3 Views  Result Date: 01/06/2020 Grade 2 spondylolisthesis of L5-S1 with pars defect    PMFS History: Patient Active Problem List   Diagnosis Date Noted  . Left hip pain 12/01/2019  . Type 2 diabetes mellitus with stage 3b chronic kidney disease, without long-term current use of insulin (HCC) 04/03/2019  . Diastolic CHF (HCC) 03/18/2019  . Dyspnea 03/08/2019  . Pulmonary edema 03/04/2019  . Physical exam 02/06/2017  . Anxiety and depression 12/05/2016  . Morbid obesity (HCC) 03/01/2016  . OSA (obstructive sleep apnea) 07/11/2015  . Pulmonary hypertension (HCC) 05/06/2015  . Heart murmur 03/03/2015  . Essential hypertension, benign 03/18/2014  . Hyperlipidemia 03/18/2014  . Post-menopause on HRT (hormone replacement therapy)  03/18/2014   Past Medical History:  Diagnosis Date  . Anxiety   . Diabetes mellitus   . Edema of extremities 03/02/2016  . Essential hypertension, benign   . Lazy eye   . Major depressive disorder, single episode, unspecified   . Obesity (BMI 30-39.9) 03/01/2016  . OSA (obstructive sleep apnea)   . Pulmonary HTN (HCC)    mild with PASP by echo 05/2016  . Pure hypercholesterolemia   . Retinopathy, due to hypertension     Family History  Adopted: Yes  Problem Relation Age of Onset  . Alcohol abuse Mother   . ADD / ADHD Mother   . Dementia Mother   . Breast cancer Neg Hx     Past Surgical History:  Procedure Laterality Date  . CPAP TITRATION  10/14/2015  . DILATION AND CURETTAGE OF UTERUS  2005-2008   x2  . EYE SURGERY     cataracts  . WISDOM TOOTH EXTRACTION     Social History   Occupational History  . Not on file  Tobacco Use  . Smoking status: Never Smoker  . Smokeless tobacco: Never Used  Vaping Use  . Vaping Use: Never used  Substance and Sexual Activity  . Alcohol use: No    Alcohol/week: 0.0 standard drinks  . Drug use: No  . Sexual activity: Yes    Partners: Male    Birth control/protection: None

## 2020-01-07 ENCOUNTER — Other Ambulatory Visit: Payer: Self-pay

## 2020-01-07 DIAGNOSIS — M545 Low back pain, unspecified: Secondary | ICD-10-CM

## 2020-01-08 ENCOUNTER — Other Ambulatory Visit: Payer: Self-pay | Admitting: Physician Assistant

## 2020-01-08 DIAGNOSIS — M5442 Lumbago with sciatica, left side: Secondary | ICD-10-CM

## 2020-01-08 NOTE — Telephone Encounter (Signed)
Cyclobenzaprine LFD 12/30/19 #15 with no refills Meloxicam LFD 12/30/19 #15 with no refills LOV 12/30/19 NOV 05/23/20

## 2020-01-15 ENCOUNTER — Ambulatory Visit: Payer: Self-pay

## 2020-01-15 NOTE — Progress Notes (Signed)
Hypertension assessment was performed for Kayla Brown, 1951-02-12 on 01/15/2020 by Aloha Gell. The patients last 3 BP readings were 138/82 on 11/19/2019, 132/80 on 12/14/2019, and 120/70 on 12/30/2019.   The patient has not had an ED visit since their last CPP follow up. The patients current Hypertension and PDC medications are: Metformin XL 500mg  twice daily.  Glipizide IR every morning before breakfast and 2.5mg  before supper.  Atorvastatin 40mg  daily . Amlodipine 10mg  daily, carvedilol 6.25mg  tabs 2 tabs in morning, 2 tabs in the evening. Furosemide 20mg  daily.   Patient currently does not have a greater than 5 day gap between last fill. The patient is currently checking their BP: Every 2 /3 days.  Their current BP The patient is not currently having low BP <90/60.    The patient is not currently having BP's >180/100, and currently having an average BP>140/90. I educated patient to inform proper points on checking BP at home such as taking resting blood pressure: sitting quietly for 5 minutes, not within 30 min. of exercising, no talking; sitting with feet flat on the floor and arm level with heart; not drinking caffeine or smoking a cigarette at least 30 min. prior to checking; and making sure to use the right size cuff (the length of the cuffs bladder should be at least equal to 75% of the circumference of the upper arm).    Patient diet response: Patient states she has not made any changes in diet. Just watching what she eats. Patient's exercise response: Patient states she has had Hip pain and back pain and it hinders her from excersing. Per patient she is getting an MRI on hip on August 16th.   Patient's misc comments:  Patient states she saw an orthopedic doctor and he referred her to a spinal doctor and she may have to get surgery depending on what provider decides.  Patient also states that her blood sugars have been high due to being on a steroid. Patient states that her blood sugars  have been around 250 but are slowly going down to the 150s.  Patient also states that her blood pressures have been in the 150s and thinks its because of her blood pressure cuff. Patient has upcoming appointment where she willl bring blood pressure cuff in to have it looked at to make sure she is usuing it right and it is functioning correctly.                                                    .               Future Appointments  Date Time Provider Department Center  02/01/2020  2:30 PM [facility] MM 2 [facility]  02/08/2020  7:40 AM GI-315 MR 2 GI-315MRI [facility] [facility]  02/09/2020  2:00 PM 04/02/2020, [name], MD [facility]  02/16/2020  9:15 AM 02/11/2020, MD [facility] None  03/04/2020  1:40 PM [place], [facility], MD [facility] None  03/10/2020  1:00 PM [facility] CCM PHARMACIST [facility]  03/18/2020 11:00 AM Wine, 05/04/2020, RN [facility] None  05/23/2020  2:00 PM 03/12/2020, MD [facility]  06/16/2020  2:30 PM Pryor Ochoa, MD [facility] None   CPA call reviewed. Upcoming RPH follow-up call 9/16.  Medication/disp hx reviewed. On track with PDC medications.  Dahlia Byes, Pharm.D., BCGP Clinical Pharmacist [facility] [phone]

## 2020-01-19 ENCOUNTER — Other Ambulatory Visit: Payer: Self-pay | Admitting: *Deleted

## 2020-01-19 ENCOUNTER — Encounter: Payer: Self-pay | Admitting: *Deleted

## 2020-01-19 NOTE — Patient Outreach (Signed)
[facility]) Care Management  [facility] Care Manager  01/19/2020   Kayla Brown 1950/12/02 [id number]  Subjective: Successful telephone outreach call to patient. HIPAA identifiers obtained. Patient explained that she may be getting in the yellow CHF zone. Patient reports that she has not been following a low sodium diet closely, she has not been able to exercise due to lower back pain, and as a result she has gained close to 5 pounds of fluid weight in about a week. Patient explains cardiology has instructed her to take extra Lasix as part of her action plan, which she just started today. Patient states she will call her cardiologist if she does not improve or if she develops additional CHF symptoms. Patient denies any BLE edema or abdominal swelling. She states she is not SOB or fatigued and that at this time her CHF symptoms are limited to the weight gain. Patient explains she that she knows what she needs to do to get back "on track" and adds that she has been stressed due to her husband's recent diagnoses of guillain barre syndrome which has caused her to eat more and not follow her diet. She states she will begin to limit her sodium intake to 2000 mg daily as advised by cardiology and will start to increase her physical activity as tolerated due to the pain by doing chair exercises. She is being followed by Dr. Roda Shutters for her lower back pain. Patient states that she is stressed but denies being overwhelmed or depressed and explains that she does have support locally from her brother. She denies having any falls and states that her home environment is safe. Patient did not have any further questions or concerns at this time. Nurse did give Kayla Brown her contact number and patient states she will call nurse as needed.   Encounter Medications:  Outpatient Encounter Medications as of 01/19/2020  Medication Sig Note  . albuterol (PROAIR HFA) 108 (90 Base) MCG/ACT inhaler Inhale 1-2 puffs into  the lungs every 6 (six) hours as needed for wheezing or shortness of breath.   . Alcohol Swabs (ALCOHOL PREP) PADS Pt uses an alcohol pad each time sugars are tested. Pt tests twice daily. Dx E11.9   . amLODipine (NORVASC) 10 MG tablet TAKE 1 TABLET BY MOUTH EVERY DAY   . atorvastatin (LIPITOR) 40 MG tablet TAKE 1 TABLET BY MOUTH EVERY DAY   . carvedilol (COREG) 6.25 MG tablet Take 2 tablets in the morning and 1 1/2(9.375 mg) tablet at bedtime   . Cholecalciferol (VITAMIN D PO) Take 2,000 Units by mouth daily.    . cyclobenzaprine (FLEXERIL) 10 MG tablet Take 1 tablet (10 mg total) by mouth at bedtime.   . fish oil-omega-3 fatty acids 1000 MG capsule Take 1 g by mouth daily.   . fluticasone (FLONASE) 50 MCG/ACT nasal spray SPRAY 2 SPRAYS INTO EACH NOSTRIL EVERY DAY (Patient taking differently: Place 2 sprays into both nostrils daily. )   . furosemide (LASIX) 20 MG tablet TAKE 1 TABLET BY MOUTH EVERY DAY   . glipiZIDE (GLUCOTROL) 5 MG tablet Take 1 tablet (5 mg total) by mouth daily before breakfast AND 0.5 tablets (2.5 mg total) daily before supper.   [name] glucose blood (FREESTYLE LITE) test strip USE TO TEST BLOOD GLUCOSE 2 TIMES DAILY. Dx E11.9   . loratadine (CLARITIN) 10 MG tablet TAKE 1 TABLET BY MOUTH EVERY DAY (Patient taking differently: Take 10 mg by mouth daily. )   . Melatonin 3 MG  TABS Take 10 mg by mouth at bedtime.    . meloxicam (MOBIC) 15 MG tablet Take 1 tablet (15 mg total) by mouth daily.   . metFORMIN (GLUCOPHAGE-XR) 500 MG 24 hr tablet Take 2 tablets (1,000 mg total) by mouth in the morning and at bedtime.   . Multiple Vitamins-Minerals (VISION FORMULA) TABS Take 1 tablet by mouth daily.    [name] OVER THE COUNTER MEDICATION Take 1 tablet by mouth daily. Equate Vision Formula 50+    . sertraline (ZOLOFT) 50 MG tablet TAKE 1 TABLET BY MOUTH EVERY DAY   . traMADol (ULTRAM) 50 MG tablet Take 1-2 tablets (50-100 mg total) by mouth daily as needed.   . TRUEPLUS LANCETS 30G MISC Use one  lancet each time sugars are tested. Pt tests twice daily. Dx. E11.9   . UNABLE TO FIND Med Name: Healthwise talking meter   . predniSONE (STERAPRED UNI-PAK 21 TAB) 10 MG (21) TBPK tablet Take as directed (Patient not taking: Reported on 01/19/2020) 01/19/2020: Completed   No facility-administered encounter medications on file as of 01/19/2020.    Functional Status:  In your present state of health, do you have any difficulty performing the following activities: 01/19/2020 11/19/2019  Hearing? Y N  Comment has hearing aids -  Vision? N N  Difficulty concentrating or making decisions? N N  Walking or climbing stairs? Y N  Comment patient uses a walker with long distances and does have difficulty climbing stairs -  Dressing or bathing? N N  Doing errands, shopping? N Y  Quarry manager and eating ? N N  Using the Toilet? N N  In the past six months, have you accidently leaked urine? N N  Do you have problems with loss of bowel control? N N  Managing your Medications? N N  Managing your Finances? N N  Housekeeping or managing your Housekeeping? N N  Some recent data might be hidden    Fall/Depression Screening: Fall Risk  01/19/2020 01/19/2020 11/19/2019  Falls in the past year? 0 0 0  Number falls in past yr: 0 0 0  Comment - - -  Injury with Fall? 0 0 0  Risk for fall due to : Impaired mobility Impaired mobility -  Follow up Falls prevention discussed;Education provided;Falls evaluation completed Falls prevention discussed;Education provided;Falls evaluation completed Falls evaluation completed   PHQ 2/9 Scores 01/19/2020 11/19/2019 05/25/2019 03/20/2019 03/17/2019 02/05/2019 10/09/2018  PHQ - 2 Score 0 0 0 0 0 0 0  PHQ- 9 Score - 0 0 0 - 0 0   Goals Addressed            This Visit's Progress   . Patient will not go to the ED or Hospital due to CHF exacerbation within the next 90 days       CARE PLAN ENTRY (see longtitudinal plan of care for additional care plan  information)   Current Barriers:  [name] Knowledge deficit related to basic heart failure pathophysiology and self care management  Case Manager Clinical Goal(s):  [name] Over the next 60 days, patient will verbalize understanding of Heart Failure Action Plan and when to call doctor . Patient states she does have the written [facility] CHF Zones and actions plans . Over the next 60 days patient will verbalize adhering to low sodium diet . Over the next 60 days patient will verbalize increasing her physical activity as  tolerated   Interventions:  . Provided verbal education on low sodium diet . Reviewed Heart Failure  Action Plan in depth and provided written copy . Discussed importance of daily weight and advised patient to weigh and record daily . Reviewed role of diuretics in prevention of fluid overload and management of heart failure  Patient Self Care Activities:  . Takes Heart Failure Medications as prescribed . Weighs daily and record (notifying MD of 3 lb weight gain over night or 5 lb in a week) . Verbalizes understanding of and follows CHF Action Plan . Patient reports that she has not been following a low sodium diet closely, she has not been able to exercise due to lower back pain, and as a result she has gained close to 5 pounds of fluid weight in about a week. Patient explains that per her cardiology instructions she is taking extra lasix which she just started today and she will call cardiology if she does not improve or if she develops additional CHF symptoms.  Initial goal documentation       Plan: RN Health Coach will send PCP Barrier letter and today's assessment note, will send patient a welcome packet and Advanced Directive documents, will call patient within the month of September, and patient agrees to future outreach calls.   Blanchie Serve RN, BSN [facility] Care Management  RN Health Coach [phone] [email or web address]

## 2020-01-24 ENCOUNTER — Other Ambulatory Visit: Payer: Self-pay | Admitting: Family Medicine

## 2020-01-24 ENCOUNTER — Other Ambulatory Visit: Payer: Self-pay | Admitting: Physician Assistant

## 2020-01-24 ENCOUNTER — Other Ambulatory Visit: Payer: Self-pay | Admitting: Orthopaedic Surgery

## 2020-01-24 DIAGNOSIS — M5442 Lumbago with sciatica, left side: Secondary | ICD-10-CM

## 2020-01-25 ENCOUNTER — Telehealth: Payer: Self-pay

## 2020-01-25 ENCOUNTER — Other Ambulatory Visit: Payer: Self-pay | Admitting: Physician Assistant

## 2020-01-25 MED ORDER — TRAMADOL HCL 50 MG PO TABS: 50.0000 mg | ORAL_TABLET | Freq: Four times a day (QID) | ORAL | 0 refills | Status: DC | PRN

## 2020-01-25 NOTE — Telephone Encounter (Signed)
Patient called in for refill of medication . Says she is only needing one more refill before her upcoming visit .

## 2020-01-25 NOTE — Telephone Encounter (Signed)
Just sent in

## 2020-01-25 NOTE — Telephone Encounter (Signed)
Cyclobenzaprine LFD 12/30/19 #15 with no refills Meloxicam LFD 12/30/19 #15 with no refills LOV 12/30/19 NOV 05/23/20 

## 2020-01-25 NOTE — Telephone Encounter (Signed)
Can you call in

## 2020-01-26 ENCOUNTER — Other Ambulatory Visit: Payer: Self-pay | Admitting: Physician Assistant

## 2020-01-26 NOTE — Telephone Encounter (Signed)
Rx was Already called into pharm yesterday per [place].

## 2020-01-26 NOTE — Telephone Encounter (Signed)
Did you call in?

## 2020-02-01 ENCOUNTER — Ambulatory Visit
Admission: RE | Admit: 2020-02-01 | Discharge: 2020-02-01 | Disposition: A | Discharge status: Home / Self Care | Payer: Medicare Other | Source: Ambulatory Visit | Attending: Family Medicine | Admitting: Family Medicine

## 2020-02-01 ENCOUNTER — Other Ambulatory Visit: Payer: Self-pay

## 2020-02-01 DIAGNOSIS — Z1231 Encounter for screening mammogram for malignant neoplasm of breast: Secondary | ICD-10-CM | POA: Diagnosis not present

## 2020-02-05 ENCOUNTER — Encounter: Payer: Self-pay | Admitting: General Practice

## 2020-02-05 NOTE — Progress Notes (Signed)
  Cardiology Office Note   Date:  02/05/2020   ID:  Kayla Brown, DOB 09/03/1950, MRN [id number]  PCP:  [name], Katherine E, MD  Cardiologist:   Peter Jordan, MD   No chief complaint on file.     History of Present Illness: Kayla Brown is a 68 y.o. female who is seen for follow up dyspnea. She has a history of OSA formerly managed by Dr Turner. She has a history of mild pulmonary HTN based on Echo in 2016 and 2017 but not replicated on Echo in 2019 and 2020. Myoview in August 2020 was normal. She has severe  OSA,HTN, and dyslipidemia. Prior pulmonary work up includes PFT 04/04/15- mild Diffusion defect, normal flows and volumes without response to bronchodilator. CT chest March 05, 2019 extensive atelectatic changes throughout the lungs with a dependent predominance, mosaic attenuation throughout the lungs, emphysema. VQ scan 03/04/2019 no unmatched segmental perfusion defects to suggest acute PE. Autoimmune work-up negative 2020. She had a 6-minute walk test that showed 267 m completed.  With no desaturations. Repeat PFTs show no airflow obstruction or restriction.  FEV1 102%, ratio 86, FVC 90% normal mid flows,, DLCO 90%.  She was admitted 9/8-9/21/20 with acute hypoxic respiratory failure. Patient developed  increased oxygen requirement on 9/10, at 8 L nasal cannula, PCCM consulted, work-up significant for atelectasis and ? pulmonary edema. BNP level was normal.  Patient improved with IV diuresis, and pulmonary PT, but unfortunately developed acute kidney injury due to diuresis. ACEi, hygroton, spironolactone  were discontinued. CT, Echo,  and CXR findings as noted below. She diuresed 20 lbs in the hospital.    On follow up today she is doing OK.. Lives at [facility]. She is trying to walk but is having pain in her left hip from her spine. She is seeing ortho. Coreg dose was increased in June but BP is still running high. Typically HR in 70s. . Is compliant with CPAP therapy. Has  taken extra lasix twice for weight gain related to sodium indiscretion.  No edema.    Past Medical History:  Diagnosis Date  . Anxiety   . Diabetes mellitus   . Edema of extremities 03/02/2016  . Essential hypertension, benign   . Lazy eye   . Major depressive disorder, single episode, unspecified   . Obesity (BMI 30-39.9) 03/01/2016  . OSA (obstructive sleep apnea)   . Pulmonary HTN (HCC)    mild with PASP 40mmHg by echo 05/2016  . Pure hypercholesterolemia   . Retinopathy, due to hypertension     Past Surgical History:  Procedure Laterality Date  . CPAP TITRATION  10/14/2015  . DILATION AND CURETTAGE OF UTERUS  2005-2008   x2  . EYE SURGERY     cataracts  . WISDOM TOOTH EXTRACTION       Current Outpatient Medications  Medication Sig Dispense Refill  . albuterol (PROAIR HFA) 108 (90 Base) MCG/ACT inhaler Inhale 1-2 puffs into the lungs every 6 (six) hours as needed for wheezing or shortness of breath. 18 g 3  . Alcohol Swabs (ALCOHOL PREP) PADS Pt uses an alcohol pad each time sugars are tested. Pt tests twice daily. Dx E11.9 100 each 3  . amLODipine (NORVASC) 10 MG tablet TAKE 1 TABLET BY MOUTH EVERY DAY 90 tablet 1  . atorvastatin (LIPITOR) 40 MG tablet TAKE 1 TABLET BY MOUTH EVERY DAY 90 tablet 1  . carvedilol (COREG) 6.25 MG tablet Take 2 tablets in the morning and 1 1/2(9.375 mg) tablet at   bedtime 315 tablet 3  . Cholecalciferol (VITAMIN D PO) Take 2,000 Units by mouth daily.     . cyclobenzaprine (FLEXERIL) 10 MG tablet TAKE 1 TABLET BY MOUTH EVERYDAY AT BEDTIME 15 tablet 0  . fish oil-omega-3 fatty acids 1000 MG capsule Take 1 g by mouth daily.    . fluticasone (FLONASE) 50 MCG/ACT nasal spray SPRAY 2 SPRAYS INTO EACH NOSTRIL EVERY DAY (Patient taking differently: Place 2 sprays into both nostrils daily. ) 18 g 1  . furosemide (LASIX) 20 MG tablet TAKE 1 TABLET BY MOUTH EVERY DAY 90 tablet 1  . glipiZIDE (GLUCOTROL) 5 MG tablet Take 1 tablet (5 mg total) by mouth daily  before breakfast AND 0.5 tablets (2.5 mg total) daily before supper. 135 tablet 3  . glucose blood (FREESTYLE LITE) test strip USE TO TEST BLOOD GLUCOSE 2 TIMES DAILY. Dx E11.9 100 each 12  . loratadine (CLARITIN) 10 MG tablet TAKE 1 TABLET BY MOUTH EVERY DAY (Patient taking differently: Take 10 mg by mouth daily. ) 30 tablet 11  . Melatonin 3 MG TABS Take 10 mg by mouth at bedtime.     . meloxicam (MOBIC) 15 MG tablet TAKE 1 TABLET BY MOUTH EVERY DAY 15 tablet 0  . metFORMIN (GLUCOPHAGE-XR) 500 MG 24 hr tablet Take 2 tablets (1,000 mg total) by mouth in the morning and at bedtime. 360 tablet 3  . Multiple Vitamins-Minerals (VISION FORMULA) TABS Take 1 tablet by mouth daily.     . OVER THE COUNTER MEDICATION Take 1 tablet by mouth daily. Equate Vision Formula 50+     . predniSONE (STERAPRED UNI-PAK 21 TAB) 10 MG (21) TBPK tablet Take as directed (Patient not taking: Reported on 01/19/2020) 21 tablet 0  . sertraline (ZOLOFT) 50 MG tablet TAKE 1 TABLET BY MOUTH EVERY DAY 90 tablet 2  . traMADol (ULTRAM) 50 MG tablet Take 1-2 tablets (50-100 mg total) by mouth daily as needed. 20 tablet 0  . traMADol (ULTRAM) 50 MG tablet Take 1 tablet (50 mg total) by mouth every 6 (six) hours as needed. 30 tablet 0  . TRUEPLUS LANCETS 30G MISC Use one lancet each time sugars are tested. Pt tests twice daily. Dx. E11.9 100 each 3  . UNABLE TO FIND Med Name: Healthwise talking meter 1 kit 0   No current facility-administered medications for this visit.    Allergies:   Patient has no known allergies.    Social History:  The patient  reports that she has never smoked. She has never used smokeless tobacco. She reports that she does not drink alcohol and does not use drugs.   Family History:  The patient's family history includes ADD / ADHD in her mother; Alcohol abuse in her mother; Dementia in her mother. She was adopted.    ROS:  Please see the history of present illness.   Otherwise, review of systems are  positive for none.   All other systems are reviewed and negative.    PHYSICAL EXAM: VS:  LMP 06/25/2008  , BMI There is no height or weight on file to calculate BMI. GEN: Well nourished, obese, in no acute distress  HEENT: normal  Neck: no JVD, carotid bruits, or masses Cardiac: RRR; no murmurs, rubs, or gallops,no edema  Respiratory:  clear to auscultation bilaterally, normal work of breathing GI: soft, nontender, nondistended, + BS MS: no deformity or atrophy  Skin: warm and dry, no rash Neuro:  Strength and sensation are intact Psych: euthymic mood, full   affect   EKG:  EKG is not ordered today. The ekg ordered today demonstrates N/A   Recent Labs: 03/03/2019: B Natriuretic Peptide 50.8 11/19/2019: ALT 16; BUN 30; Creatinine, Ser 1.08; Hemoglobin 12.7; Platelets 294.0; Potassium 4.2; Sodium 140; TSH 1.40    Lipid Panel    Component Value Date/Time   CHOL 135 11/19/2019 1450   TRIG 210.0 (H) 11/19/2019 1450   HDL 44.80 11/19/2019 1450   CHOLHDL 3 11/19/2019 1450   VLDL 42.0 (H) 11/19/2019 1450   LDLCALC 61 05/25/2019 1352   LDLDIRECT 65.0 11/19/2019 1450      Wt Readings from Last 3 Encounters:  01/06/20 183 lb 6.4 oz (83.2 kg)  12/30/19 180 lb (81.6 kg)  12/14/19 182 lb 6.4 oz (82.7 kg)      Other studies Reviewed: Additional studies/ records that were reviewed today include:  Myoview 02/02/19: Study Highlights    Nuclear stress EF: 54%.  There was no ST segment deviation noted during stress.  This is a low risk study.  The left ventricular ejection fraction is normal (55-65%).  The study is normal.   Low risk stress nuclear study with normal perfusion and normal left ventricular regional and global systolic function.      Echo 03/06/19: IMPRESSIONS    1. The left ventricle has hyperdynamic systolic function, with an ejection fraction of >65%. The cavity size was normal. There is moderate concentric left ventricular hypertrophy. Left ventricular  diastolic Doppler parameters are consistent with  impaired relaxation. Elevated mean left atrial pressure No evidence of left ventricular regional wall motion abnormalities.  2. The right ventricle has normal systolic function. The cavity was normal. There is no increase in right ventricular wall thickness. Right ventricular systolic pressure could not be assessed.  3. Left atrial size was mildly dilated.  4. Trivial pericardial effusion is present.  5. The aorta is normal unless otherwise noted.  PORTABLE CHEST 1 VIEW  COMPARISON:  Chest radiograph dated 03/05/2019  FINDINGS: Chronic interstitial coarsening and mild bronchitic changes. No focal consolidation, pleural effusion, or pneumothorax. Linear density in the left mid lung field, likely atelectatic changes. The cardiac silhouette is within normal limits. No acute osseous pathology.  IMPRESSION: No active disease.   Electronically Signed   By: Arash  Radparvar M.D.   On: 03/11/2019 10:21  CT CHEST WITHOUT CONTRAST  TECHNIQUE: Multidetector CT imaging of the chest was performed following the standard protocol without IV contrast.  COMPARISON:  CT 04/05/2015  FINDINGS: Cardiovascular: Normal heart size. No pericardial effusion. Atherosclerotic calcification of the aortic leaflets is noted. There is mitral annular calcification is well. The aorta is normal caliber with atheromatous plaque. Additional calcifications are present in the proximal great vessels and upper abdominal aorta. Major venous structures are unremarkable.  Mediastinum/Nodes: Numerous though nonenlarged mediastinal nodes are present. Evaluation for hilar adenopathy is limited in the absence of contrast. No axillary adenopathy. Thyroid gland and thoracic inlet are unremarkable. Bowing of the posterior trachea 0 likely related to imaging during exhalation. There is mild airways thickening. The esophagus is unremarkable.  Lungs/Pleura:  Redemonstration of mosaic attenuation throughout the lungs superimposed on centrilobular and paraseptal emphysema. There is increasing bandlike areas of subsegmental atelectasis and/or scarring as well as additional atelectatic change posteriorly in the lung bases. No pneumothorax. No effusion. Stable subpleural area of scarring is seen in the right middle lobe.  Upper Abdomen: Small accessory splenule. Mild bilateral symmetric perinephric stranding, a nonspecific finding though may correlate with either age or decreased   renal function. Portion of the hepatic flexure is interposed anterior to the liver. Fatty replacement of the pancreas. No pancreatic ductal dilatation or surrounding inflammatory changes. No acute abnormalities present in the visualized portions of the upper abdomen.  Musculoskeletal: Multilevel degenerative changes are present in the imaged portions of the spine. No acute osseous abnormality or suspicious osseous lesion.  IMPRESSION: 1. Extensive atelectatic changes are present throughout the lungs with a dependent predominance. 2. Redemonstration of mosaic attenuation throughout the lungs superimposed on centrilobular and paraseptal emphysema, suggestive of small airways disease. 3. Mild bilateral symmetric perinephric stranding, a nonspecific finding though may correlate with either age or decreased renal function. 4. Aortic Atherosclerosis (ICD10-I70.0) and Emphysema (ICD10-J43.9).   Electronically Signed   By: Lovena Le M.D.   On: 03/05/2019 22:41  ASSESSMENT AND PLAN:  1.  Chronic diastolic CHF. Admitted in September with acute respiratory failure. Given clinical response this appears to be predominantly acute on chronic diastolic CHF.  BNP low but may be masked by obesity. Echo with moderate LVH but normal systolic function and no evidence of pulmonary HTN. She is clinically doing well.  Continue sodium restriction. Continue current lasix dose.  Discussed when to take an extra lasix.   2.  Hypertension -BP is still not well controlled. ACEi, spironolactone and HCTZ held in hospital due to AKI. Will increase Coreg to 25 mg bid.   3.  history of  Pulmonary HTN. Noted on initial Echo in 2016 and 2017 but not really noted since.   4.  Morbid Obesity  -I have encouraged her to loose weight   5.  OSA on CPAP. followed by pulmonary now.   Current medicines are reviewed at length with the patient today.  The patient does not have concerns regarding medicines.  The following changes have been made:  See above.  Labs/ tests ordered today include:  No orders of the defined types were placed in this encounter.    Disposition:   FU with APP in 4 months  Signed, Jovonta Levit [name], MD  02/05/2020 3:20 PM    [facility] [address], [place], [place], [address] Phone [phone], Fax [phone]

## 2020-02-08 ENCOUNTER — Other Ambulatory Visit: Payer: Self-pay

## 2020-02-08 ENCOUNTER — Ambulatory Visit
Admission: RE | Admit: 2020-02-08 | Discharge: 2020-02-08 | Disposition: A | Discharge status: Home / Self Care | Payer: Medicare Other | Source: Ambulatory Visit | Attending: Orthopaedic Surgery | Admitting: Orthopaedic Surgery

## 2020-02-08 DIAGNOSIS — M545 Low back pain, unspecified: Secondary | ICD-10-CM

## 2020-02-08 DIAGNOSIS — M48061 Spinal stenosis, lumbar region without neurogenic claudication: Secondary | ICD-10-CM | POA: Diagnosis not present

## 2020-02-09 ENCOUNTER — Encounter: Payer: Self-pay | Admitting: Cardiology

## 2020-02-09 ENCOUNTER — Ambulatory Visit (INDEPENDENT_AMBULATORY_CARE_PROVIDER_SITE_OTHER): Payer: Medicare Other | Admitting: Cardiology

## 2020-02-09 VITALS — BP 152/66 | HR 67 | Ht 60.0 in | Wt 182.4 lb

## 2020-02-09 DIAGNOSIS — E1121 Type 2 diabetes mellitus with diabetic nephropathy: Secondary | ICD-10-CM

## 2020-02-09 DIAGNOSIS — G4733 Obstructive sleep apnea (adult) (pediatric): Secondary | ICD-10-CM

## 2020-02-09 DIAGNOSIS — I10 Essential (primary) hypertension: Secondary | ICD-10-CM

## 2020-02-09 DIAGNOSIS — I5032 Chronic diastolic (congestive) heart failure: Secondary | ICD-10-CM | POA: Diagnosis not present

## 2020-02-09 DIAGNOSIS — N1831 Chronic kidney disease, stage 3a: Secondary | ICD-10-CM

## 2020-02-09 MED ORDER — CARVEDILOL 25 MG PO TABS
25.0000 mg | ORAL_TABLET | Freq: Two times a day (BID) | ORAL | 3 refills | Status: DC
Start: 2020-02-09 — End: 2021-01-24

## 2020-02-09 NOTE — Patient Instructions (Signed)
Increase Coreg to 25 mg twice a day  Continue your other therapy  Follow up in 6 months.

## 2020-02-16 ENCOUNTER — Encounter: Payer: Self-pay | Admitting: Orthopaedic Surgery

## 2020-02-16 ENCOUNTER — Ambulatory Visit (INDEPENDENT_AMBULATORY_CARE_PROVIDER_SITE_OTHER): Payer: Medicare Other | Admitting: Orthopaedic Surgery

## 2020-02-16 DIAGNOSIS — M5416 Radiculopathy, lumbar region: Secondary | ICD-10-CM

## 2020-02-16 NOTE — Progress Notes (Signed)
Office Visit Note   Patient: Kayla Brown           Date of Birth: Jan 31, 1951           MRN: [id number] Visit Date: 02/16/2020              Requested by: Sheliah Hatch, MD [address] [place],  [place] [address] PCP: Sheliah Hatch, MD   Assessment & Plan: Visit Diagnoses:  1. Lumbar radiculopathy     Plan: Impression is 4 x 8 mm complex fluid collection in the posterior lateral spinal canal on the left at L4-5 likely from pseudoarthrosis of the pars defect of L4.  She also has a 6 mm anterolisthesis of L4-5 with bilateral pars defects of L5.  Severe left nerve root compression in the foramen.  Moderate right foraminal stenosis and advanced disc degeneration L5-S1 with spurring and bilateral L5 nerve root impingement.  At this point, we will make a referral to Dr. Conchita Paris for further evaluation treatment recommendation.  We can refill her tramadol as needed until that point.  Follow-Up Instructions: Return if symptoms worsen or fail to improve.   Orders:  No orders of the defined types were placed in this encounter.  No orders of the defined types were placed in this encounter.     Procedures: No procedures performed   Clinical Data: No additional findings.   Subjective: Chief Complaint  Patient presents with  . Lower Back - Pain    HPI patient is a pleasant 69 year old female who comes in today to discuss MRI results of her lumbar spine. MRI from 02/08/2020 shows a 4 x 8 mm complex fluid collection in the posterior lateral spinal canal on the left at L4-5 likely from pseudoarthrosis of the pars defect of L4.  She also has a 6 mm anterolisthesis of L4-5 with bilateral pars defects of L5.  Severe left nerve root compression in the foramen.  Moderate right foraminal stenosis and advanced disc degeneration L5-S1 with spurring and bilateral L5 nerve root impingement.  she has had continued left lower extremity radiculopathy.  Her pain has been somewhat  relieved with tramadol.     Objective: Vital Signs: LMP 06/25/2008     Ortho Exam stable lumbar exam  Specialty Comments:  No specialty comments available.  Imaging: No new imaging   PMFS History: Patient Active Problem List   Diagnosis Date Noted  . Left hip pain 12/01/2019  . Type 2 diabetes mellitus with stage 3b chronic kidney disease, without long-term current use of insulin (HCC) 04/03/2019  . Diastolic CHF (HCC) 03/18/2019  . Dyspnea 03/08/2019  . Pulmonary edema 03/04/2019  . Physical exam 02/06/2017  . Anxiety and depression 12/05/2016  . Morbid obesity (HCC) 03/01/2016  . OSA (obstructive sleep apnea) 07/11/2015  . Pulmonary hypertension (HCC) 05/06/2015  . Heart murmur 03/03/2015  . Essential hypertension, benign 03/18/2014  . Hyperlipidemia 03/18/2014  . Post-menopause on HRT (hormone replacement therapy) 03/18/2014   Past Medical History:  Diagnosis Date  . Anxiety   . Diabetes mellitus   . Edema of extremities 03/02/2016  . Essential hypertension, benign   . Lazy eye   . Major depressive disorder, single episode, unspecified   . Obesity (BMI 30-39.9) 03/01/2016  . OSA (obstructive sleep apnea)   . Pulmonary HTN (HCC)    mild with PASP by echo 05/2016  . Pure hypercholesterolemia   . Retinopathy, due to hypertension     Family History  Adopted: Yes  Problem Relation Age of Onset  . Alcohol abuse Mother   . ADD / ADHD Mother   . Dementia Mother   . Breast cancer Neg Hx     Past Surgical History:  Procedure Laterality Date  . CPAP TITRATION  10/14/2015  . DILATION AND CURETTAGE OF UTERUS  2005-2008   x2  . EYE SURGERY     cataracts  . WISDOM TOOTH EXTRACTION     Social History   Occupational History  . Occupation: retired  Tobacco Use  . Smoking status: Never Smoker  . Smokeless tobacco: Never Used  Vaping Use  . Vaping Use: Never used  Substance and Sexual Activity  . Alcohol use: No    Alcohol/week: 0.0 standard drinks  .  Drug use: No  . Sexual activity: Yes    Partners: Male    Birth control/protection: None

## 2020-02-24 ENCOUNTER — Other Ambulatory Visit: Payer: Self-pay | Admitting: Physician Assistant

## 2020-02-24 ENCOUNTER — Telehealth: Payer: Self-pay | Admitting: Orthopaedic Surgery

## 2020-02-24 MED ORDER — TRAMADOL HCL 50 MG PO TABS: 50.0000 mg | ORAL_TABLET | Freq: Every day | ORAL | 0 refills | Status: DC | PRN

## 2020-02-24 NOTE — Telephone Encounter (Signed)
Sent in

## 2020-02-24 NOTE — Telephone Encounter (Signed)
Patient called. She would like Tramadol called in for her. Her call back number is [phone]

## 2020-03-02 DIAGNOSIS — Z6834 Body mass index (BMI) 34.0-34.9, adult: Secondary | ICD-10-CM | POA: Diagnosis not present

## 2020-03-02 DIAGNOSIS — M4316 Spondylolisthesis, lumbar region: Secondary | ICD-10-CM | POA: Diagnosis not present

## 2020-03-02 DIAGNOSIS — M5416 Radiculopathy, lumbar region: Secondary | ICD-10-CM | POA: Insufficient documentation

## 2020-03-02 DIAGNOSIS — I10 Essential (primary) hypertension: Secondary | ICD-10-CM | POA: Diagnosis not present

## 2020-03-04 ENCOUNTER — Ambulatory Visit (INDEPENDENT_AMBULATORY_CARE_PROVIDER_SITE_OTHER): Payer: Medicare Other | Admitting: Internal Medicine

## 2020-03-04 ENCOUNTER — Other Ambulatory Visit: Payer: Self-pay

## 2020-03-04 ENCOUNTER — Encounter: Payer: Self-pay | Admitting: Internal Medicine

## 2020-03-04 VITALS — BP 150/70 | HR 58 | Ht 60.0 in | Wt 181.4 lb

## 2020-03-04 DIAGNOSIS — E1121 Type 2 diabetes mellitus with diabetic nephropathy: Secondary | ICD-10-CM

## 2020-03-04 DIAGNOSIS — E1165 Type 2 diabetes mellitus with hyperglycemia: Secondary | ICD-10-CM

## 2020-03-04 DIAGNOSIS — E1122 Type 2 diabetes mellitus with diabetic chronic kidney disease: Secondary | ICD-10-CM

## 2020-03-04 DIAGNOSIS — Z794 Long term (current) use of insulin: Secondary | ICD-10-CM

## 2020-03-04 DIAGNOSIS — N1831 Chronic kidney disease, stage 3a: Secondary | ICD-10-CM

## 2020-03-04 LAB — POCT GLUCOSE (DEVICE FOR HOME USE): POC Glucose: 150 mg/dl — AB (ref 70–99)

## 2020-03-04 LAB — POCT GLYCOSYLATED HEMOGLOBIN (HGB A1C): Hemoglobin A1C: 7.2 % — AB (ref 4.0–5.6)

## 2020-03-04 MED ORDER — GLIPIZIDE 5 MG PO TABS
5.0000 mg | ORAL_TABLET | Freq: Two times a day (BID) | ORAL | 3 refills | Status: DC
Start: 2020-03-04 — End: 2020-12-21

## 2020-03-04 MED ORDER — METFORMIN HCL ER 500 MG PO TB24
1000.0000 mg | ORAL_TABLET | Freq: Two times a day (BID) | ORAL | 3 refills | Status: DC
Start: 2020-03-04 — End: 2020-07-21

## 2020-03-04 NOTE — Patient Instructions (Addendum)
-    Increase Glipizide 5 mg, 1 tablet Before breakfast and 1 tablet before supper   - Continue Metformin 500 mg , 2 tablets with Breakfast and 2 tablets before Supper   - Please check out the insurance coverage for  Farxiga or Jardiance    - HOW TO TREAT LOW BLOOD SUGARS (Blood sugar LESS THAN 70 MG/DL)  Please follow the RULE OF 15 for the treatment of hypoglycemia treatment (when your (blood sugars are less than 70 mg/dL)    STEP 1: Take 15 grams of carbohydrates when your blood sugar is low, which includes:   3-4 GLUCOSE TABS  OR  3-4 OZ OF JUICE OR REGULAR SODA OR  ONE TUBE OF GLUCOSE GEL     STEP 2: RECHECK blood sugar in 15 MINUTES STEP 3: If your blood sugar is still low at the 15 minute recheck --> then, go back to STEP 1 and treat AGAIN with another 15 grams of carbohydrates.

## 2020-03-04 NOTE — Progress Notes (Signed)
Name: Kayla Brown  Age/ Sex: 69 y.o., female   MRN/ DOB: [id number], 04/13/1951     PCP: Sheliah Hatch, MD   Reason for Endocrinology Evaluation: Type 2 Diabetes Mellitus  Initial Endocrine Consultative Visit: 04/06/2019    PATIENT IDENTIFIER: Kayla Brown is a 69 y.o. female with a past medical history of HTN, CHF and T2DM . The patient has followed with Endocrinology clinic since 04/06/2019 for consultative assistance with management of her diabetes.  DIABETIC HISTORY:  [name] was diagnosed with T2DM in 2001. She was unable to start Jardiance due to cost . Actos had to be stopped due to CHF. Her hemoglobin A1c has ranged from 6.7% in 2017, peaking at 8.1% in 2020  On her initial visit to our clinic she had an A1c of 8.0%. She was on [employer] and Metformin. We switched Glipizide XL to regular Glipizide and continued metformin     SUBJECTIVE:   During the last visit (08/31/2019): A1c of 6.7 %. We continued metformin and Glipizide    Today (03/04/2020): [name] is here for a follow up on diabetes management.  She checks her blood sugars 1 times daily, preprandial to breakfast and bedtime. The patient has had hypoglycemic episodes since the last clinic visit    She has been having issues with her back, requiring steroids and decrease in exercise.     HOME DIABETES REGIMEN:  Glipizide 5 mg, 1 tablet Before breakfast and Half a tablet Before Supper  Metformin 500 mg 2 tablets with Breakfast and 2 tablets before Supper   GLUCOSE LOG:  BG's 112- 265 mg/dL    DIABETIC COMPLICATIONS: Microvascular complications:   CKD   Denies: neuropathy , retinopathy   Last eye exam: Completed 06/2018  Macrovascular complications:   CHF  Denies: CAD, PVD, CVA  HISTORY:  Past Medical History:  Past Medical History:  Diagnosis Date   Anxiety    Diabetes mellitus    Edema of extremities 03/02/2016   Essential hypertension, benign    Lazy eye    Major  depressive disorder, single episode, unspecified    Obesity (BMI 30-39.9) 03/01/2016   OSA (obstructive sleep apnea)    Pulmonary HTN (HCC)    mild with PASP by echo 05/2016   Pure hypercholesterolemia    Retinopathy, due to hypertension    Past Surgical History:  Past Surgical History:  Procedure Laterality Date   CPAP TITRATION  10/14/2015   DILATION AND CURETTAGE OF UTERUS  2005-2008   x2   EYE SURGERY     cataracts   WISDOM TOOTH EXTRACTION      Social History:  reports that she has never smoked. She has never used smokeless tobacco. She reports that she does not drink alcohol and does not use drugs. Family History:  Family History  Adopted: Yes  Problem Relation Age of Onset   Alcohol abuse Mother    ADD / ADHD Mother    Dementia Mother    Breast cancer Neg Hx      HOME MEDICATIONS: Allergies as of 03/04/2020   No Known Allergies     Medication List       Accurate as of March 04, 2020  1:41 PM. If you have any questions, ask your nurse or doctor.        albuterol 108 (90 Base) MCG/ACT inhaler Commonly known as: ProAir HFA Inhale 1-2 puffs into the lungs every 6 (six) hours as needed for wheezing or shortness of  breath.   Alcohol Prep Pads Pt uses an alcohol pad each time sugars are tested. Pt tests twice daily. Dx E11.9   amLODipine 10 MG tablet Commonly known as: NORVASC TAKE 1 TABLET BY MOUTH EVERY DAY   atorvastatin 40 MG tablet Commonly known as: LIPITOR TAKE 1 TABLET BY MOUTH EVERY DAY   carvedilol 25 MG tablet Commonly known as: COREG Take 1 tablet (25 mg total) by mouth 2 (two) times daily.   cyclobenzaprine 10 MG tablet Commonly known as: FLEXERIL TAKE 1 TABLET BY MOUTH EVERYDAY AT BEDTIME   fish oil-omega-3 fatty acids 1000 MG capsule Take 1 g by mouth daily.   fluticasone 50 MCG/ACT nasal spray Commonly known as: FLONASE SPRAY 2 SPRAYS INTO EACH NOSTRIL EVERY DAY What changed: See the new instructions.     furosemide 20 MG tablet Commonly known as: LASIX TAKE 1 TABLET BY MOUTH EVERY DAY   glipiZIDE 5 MG tablet Commonly known as: GLUCOTROL Take 1 tablet (5 mg total) by mouth daily before breakfast AND 0.5 tablets (2.5 mg total) daily before supper.   glucose blood test strip Commonly known as: FREESTYLE LITE USE TO TEST BLOOD GLUCOSE 2 TIMES DAILY. Dx E11.9   loratadine 10 MG tablet Commonly known as: CLARITIN TAKE 1 TABLET BY MOUTH EVERY DAY   melatonin 3 MG Tabs tablet Take 10 mg by mouth at bedtime.   meloxicam 15 MG tablet Commonly known as: MOBIC TAKE 1 TABLET BY MOUTH EVERY DAY   metFORMIN 500 MG 24 hr tablet Commonly known as: GLUCOPHAGE-XR Take 2 tablets (1,000 mg total) by mouth in the morning and at bedtime.   OVER THE COUNTER MEDICATION Take 1 tablet by mouth daily. Equate Vision Formula 50+   predniSONE 10 MG (21) Tbpk tablet Commonly known as: STERAPRED UNI-PAK 21 TAB Take as directed   sertraline 50 MG tablet Commonly known as: ZOLOFT TAKE 1 TABLET BY MOUTH EVERY DAY   traMADol 50 MG tablet Commonly known as: ULTRAM Take 1 tablet (50 mg total) by mouth every 6 (six) hours as needed.   traMADol 50 MG tablet Commonly known as: ULTRAM Take 1-2 tablets (50-100 mg total) by mouth daily as needed.   TRUEplus Lancets 30G Misc Use one lancet each time sugars are tested. Pt tests twice daily. Dx. E11.9   UNABLE TO FIND Med Name: Healthwise talking meter   Vision Formula Tabs Take 1 tablet by mouth daily.   VITAMIN D PO Take 2,000 Units by mouth daily.        OBJECTIVE:   Vital Signs: BP (!) 150/70 (BP Location: Left Arm, Patient Position: Sitting, Cuff Size: Normal)    Pulse (!) 58    Ht 5' (1.524 m)    Wt 181 lb 6.4 oz (82.3 kg)    LMP 06/25/2008    SpO2 98%    BMI 35.43 kg/m   Wt Readings from Last 3 Encounters:  03/04/20 181 lb 6.4 oz (82.3 kg)  02/09/20 182 lb 6.4 oz (82.7 kg)  01/06/20 183 lb 6.4 oz (83.2 kg)     Exam: General: Pt  appears well and is in NAD  Neck: General: Supple without adenopathy. Thyroid: Thyroid size normal.  No goiter or nodules appreciated. No thyroid bruit.  Lungs: Clear with good BS bilat with no rales, rhonchi, or wheezes  Heart: RRR with normal S1 and S2 and no gallops; no murmurs; no rub  Extremities: No pretibial edema.   Neuro: MS is good with appropriate affect, pt  is alert and Ox3   DM foot exam: 03/04/2020  The skin of the feet is without sores or ulcerations. The pedal pulses are 2+ on right and 2+ on left. The sensation is intact to a screening 5.07, 10 gram monofilament bilaterally    DATA REVIEWED:  Lab Results  Component Value Date   HGBA1C 7.2 (A) 03/04/2020   HGBA1C 7.3 (H) 11/19/2019   HGBA1C 6.7 (A) 08/28/2019   Lab Results  Component Value Date   MICROALBUR 4.7 (H) 05/25/2019   LDLCALC 61 05/25/2019   CREATININE 1.08 11/19/2019   Lab Results  Component Value Date   MICRALBCREAT 5.8 05/25/2019     Lab Results  Component Value Date   CHOL 135 11/19/2019   HDL 44.80 11/19/2019   LDLCALC 61 05/25/2019   LDLDIRECT 65.0 11/19/2019   TRIG 210.0 (H) 11/19/2019   CHOLHDL 3 11/19/2019       In Office BG 150 mg/dL   ASSESSMENT / PLAN / RECOMMENDATIONS:   1) Type 2 Diabetes Mellitus, Sub- Optimally controlled, With CKD III complications - Most recent A1c of 7.2 %. Goal A1c < 7.0%.    - A1c stable  - Pt attributes this to decrease exercise due to back issues, we discussed aquatic exercise, she will discuss with orthopedist. Pt also had steroid intake  - Will increase Glipizide as below - SGLT-2 inhibitors and GLP-1 agonists are cost prohibitive, she will have a pharmacist call her this week to discuss insurance plans and will ask about [place] or jardiance  - I have asked her to check glucose at supper time when possible    MEDICATIONS:  Increase Glipizide 5 mg, 1 tablet BID    Continue Metformin 500 mg 2 tablets with Breakfast and 2 tablets before  Supper   EDUCATION / INSTRUCTIONS:  BG monitoring instructions: Patient is instructed to check her blood sugars 2 times a day, fasting and supper.   Call [facility] if: BG persistently < 70   I reviewed the Rule of 15 for the treatment of hypoglycemia in detail with the patient. Literature supplied.     F/U in 6 months    Signed electronically by: Lyndle Herrlich, MD  [facility] Endocrinology  [facility] [address] [place], [place] [address] Phone: [phone] FAX: [phone]   CC: Sheliah Hatch, MD [address] [place] [address] Phone: [phone]  Fax: [phone]  Return to Endocrinology clinic as below: Future Appointments  Date Time Provider Department Center  03/10/2020  1:00 PM [facility] CCM PHARMACIST [facility]  03/18/2020 11:00 AM Wine, Pryor Ochoa, RN [facility] None  05/23/2020  2:00 PM Sheliah Hatch, MD [facility]  06/02/2020  2:40 PM [place], [name], MD [facility]  06/16/2020  2:30 PM Waymon Budge, MD [facility] None

## 2020-03-10 ENCOUNTER — Ambulatory Visit: Payer: Medicare Other

## 2020-03-10 DIAGNOSIS — N1832 Chronic kidney disease, stage 3b: Secondary | ICD-10-CM

## 2020-03-10 DIAGNOSIS — I10 Essential (primary) hypertension: Secondary | ICD-10-CM

## 2020-03-10 DIAGNOSIS — E1122 Type 2 diabetes mellitus with diabetic chronic kidney disease: Secondary | ICD-10-CM

## 2020-03-10 NOTE — Patient Instructions (Signed)
Please review care plan below and call me at [phone] with any questions! You patient assistance application for Marcelline Deist will arrive in a separate envelope.  Thank you, Bard Herbert., Clinical Pharmacist  Goals Addressed            This Visit's Progress   . A1c <7%   On track    CARE PLAN ENTRY . Diabetes: type 2 Lab Results  Component Value Date   HGBA1C 7.3 (H) 11/19/2019   Lab Results  Component Value Date   CREATININE 1.08 11/19/2019   CREATININE 1.17 (H) 11/09/2019   CREATININE 1.15 05/25/2019 .  Current antihyperglycemic regimen:  Metformin XL 500 mg two tabs (1000 mg) twice daily  Glipizide IR 5 mg every morning before breakfast, 5 mg before supper   Pharmacist Clinical Goal(s):  [name] Over the next 180 days, patient will work with PharmD and primary care provider as needed to achieve A1c<7% . PAP application coordination on Farxiga  Interventions: . Comprehensive medication review performed, medication list updated in electronic medical record . Inter-disciplinary care team collaboration (see longitudinal plan of care)  Patient Self Care Activities:  . Reduce consumption of sweets . Patient will check blood glucose every morning, document, and provide at future appointments . Patient will focus on medication adherence by continuing current medication management  . Patient will take medications as prescribed . Patient will contact provider with any episodes of hypoglycemia . Patient will report any questions or concerns to provider   Initial goal documentation.    . BP <140/90   On track    CARE PLAN ENTRY (see longitudinal plan of care for additional care plan information)  Current Barriers:  . Uncontrolled hypertension, complicated by Chronic Kidney Disease, Diabetes, Heart Failure . Current antihypertensive regimen: amlodipine 10 mg, carvedilol 25 mg twice daily, furosemide 20 mg daily . Previously on RAASi therapy with spironolactone and losartan but was  stopped after developing AKI.  [name] Last practice recorded BP readings:  BP Readings from Last 3 Encounters:  11/19/19 138/82  11/09/19 122/60  08/28/19 136/78   Pharmacist Clinical Goal(s):  [name] Over the next 180 days, patient will work with PharmD and providers to optimize antihypertensive regimen.  Interventions: . Inter-disciplinary care team collaboration (see longitudinal plan of care). . Comprehensive medication review performed; medication list updated in the electronic medical record.  . Monitor BP daily at 1pm unless instructed otherwise at May cardiology appointment.   Patient Self Care Activities:  . Patient will continue to check BP daily, document, and provide at future appointments. . Patient will focus on medication adherence by continuing with current medication management. . Consider pharmacy services we discussed to help with medication organization through pill packaging and medication delivery. . Continue daily weights for heart failure.  Initial goal documentation.      The patient verbalized understanding of instructions provided today and agreed to receive a mailed copy of patient instruction and/or educational materials. Telephone follow up appointment with pharmacy team member scheduled for: See next appointment with "Care Management Staff" under "What's Next" below.   Dahlia Byes, Pharm.D., BCGP Clinical Pharmacist [facility] [phone]

## 2020-03-10 NOTE — Progress Notes (Signed)
Chronic Care Management Pharmacy  Name: Kayla MirzaVickie Messler  MRN: [id number] DOB: 07/17/1950  Chief Complaint/HPI  Kayla Brown,  69 y.o. , female presents for their Follow-Up CCM visit with the clinical pharmacist via telephone due to COVID-19 Pandemic.  PCP : Sheliah Hatchabori, Katherine E, MD  Their chronic conditions include: Hypertension, Hyperlipidemia, Diabetes, Heart Failure and CKD.  Patient Active Problem List   Diagnosis Date Noted  . Left hip pain 12/01/2019  . Type 2 diabetes mellitus with stage 3b chronic kidney disease, without long-term current use of insulin (HCC) 04/03/2019  . Diastolic CHF (HCC) 03/18/2019  . Dyspnea 03/08/2019  . Pulmonary edema 03/04/2019  . Physical exam 02/06/2017  . Anxiety and depression 12/05/2016  . Morbid obesity (HCC) 03/01/2016  . OSA (obstructive sleep apnea) 07/11/2015  . Pulmonary hypertension (HCC) 05/06/2015  . Heart murmur 03/03/2015  . Essential hypertension, benign 03/18/2014  . Hyperlipidemia 03/18/2014  . Post-menopause on HRT (hormone replacement therapy) 03/18/2014   Social History   Socioeconomic History  . Marital status: Married    Spouse name: Cherre HugerMack  . Number of children: Not on file  . Years of education: Not on file  . Highest education level: 12th grade  Occupational History  . Occupation: retired  Tobacco Use  . Smoking status: Never Smoker  . Smokeless tobacco: Never Used  Vaping Use  . Vaping Use: Never used  Substance and Sexual Activity  . Alcohol use: No    Alcohol/week: 0.0 standard drinks  . Drug use: No  . Sexual activity: Yes    Partners: Male    Birth control/protection: None  Other Topics Concern  . Not on file  Social History Narrative  . Not on file   Social Determinants of Health   Financial Resource Strain: Low Risk   . Difficulty of Paying Living Expenses: Not very hard  Food Insecurity: No Food Insecurity  . Worried About Programme researcher, broadcasting/film/videounning Out of Food in the Last Year: Never true  . Ran Out of  Food in the Last Year: Never true  Transportation Needs: No Transportation Needs  . Lack of Transportation (Medical): No  . Lack of Transportation (Non-Medical): No  Physical Activity:   . Days of Exercise per Week: Not on file  . Minutes of Exercise per Session: Not on file  Stress: Stress Concern Present  . Feeling of Stress : Rather much  Social Connections:   . Frequency of Communication with Friends and Family: Not on file  . Frequency of Social Gatherings with Friends and Family: Not on file  . Attends Religious Services: Not on file  . Active Member of Clubs or Organizations: Not on file  . Attends BankerClub or Organization Meetings: Not on file  . Marital Status: Not on file   Family History  Adopted: Yes  Problem Relation Age of Onset  . Alcohol abuse Mother   . ADD / ADHD Mother   . Dementia Mother   . Breast cancer Neg Hx    No Known Allergies  Outpatient Encounter Medications as of 03/10/2020  Medication Sig Note  . albuterol (PROAIR HFA) 108 (90 Base) MCG/ACT inhaler Inhale 1-2 puffs into the lungs every 6 (six) hours as needed for wheezing or shortness of breath.   . Alcohol Swabs (ALCOHOL PREP) PADS Pt uses an alcohol pad each time sugars are tested. Pt tests twice daily. Dx E11.9   . amLODipine (NORVASC) 10 MG tablet TAKE 1 TABLET BY MOUTH EVERY DAY   . atorvastatin (LIPITOR) 40  MG tablet TAKE 1 TABLET BY MOUTH EVERY DAY   . carvedilol (COREG) 25 MG tablet Take 1 tablet (25 mg total) by mouth 2 (two) times daily.   . Cholecalciferol (VITAMIN D PO) Take 2,000 Units by mouth daily.    . cyclobenzaprine (FLEXERIL) 10 MG tablet TAKE 1 TABLET BY MOUTH EVERYDAY AT BEDTIME   . fish oil-omega-3 fatty acids 1000 MG capsule Take 1 g by mouth daily.   . fluticasone (FLONASE) 50 MCG/ACT nasal spray SPRAY 2 SPRAYS INTO EACH NOSTRIL EVERY DAY (Patient taking differently: Place 2 sprays into both nostrils daily. )   . furosemide (LASIX) 20 MG tablet TAKE 1 TABLET BY MOUTH EVERY DAY    . glipiZIDE (GLUCOTROL) 5 MG tablet Take 1 tablet (5 mg total) by mouth 2 (two) times daily before a meal.   . glucose blood (FREESTYLE LITE) test strip USE TO TEST BLOOD GLUCOSE 2 TIMES DAILY. Dx E11.9   . loratadine (CLARITIN) 10 MG tablet TAKE 1 TABLET BY MOUTH EVERY DAY (Patient taking differently: Take 10 mg by mouth daily. )   . Melatonin 3 MG TABS Take 10 mg by mouth at bedtime.    . meloxicam (MOBIC) 15 MG tablet TAKE 1 TABLET BY MOUTH EVERY DAY   . metFORMIN (GLUCOPHAGE-XR) 500 MG 24 hr tablet Take 2 tablets (1,000 mg total) by mouth in the morning and at bedtime.   . Multiple Vitamins-Minerals (VISION FORMULA) TABS Take 1 tablet by mouth daily.    [name] OVER THE COUNTER MEDICATION Take 1 tablet by mouth daily. Equate Vision Formula 50+    . predniSONE (STERAPRED UNI-PAK 21 TAB) 10 MG (21) TBPK tablet Take as directed 01/19/2020: Completed  . sertraline (ZOLOFT) 50 MG tablet TAKE 1 TABLET BY MOUTH EVERY DAY   . traMADol (ULTRAM) 50 MG tablet Take 1 tablet (50 mg total) by mouth every 6 (six) hours as needed.   . traMADol (ULTRAM) 50 MG tablet Take 1-2 tablets (50-100 mg total) by mouth daily as needed.   . TRUEPLUS LANCETS 30G MISC Use one lancet each time sugars are tested. Pt tests twice daily. Dx. E11.9   . UNABLE TO FIND Med Name: Healthwise talking meter    No facility-administered encounter medications on file as of 03/10/2020.   Patient Care Team    Relationship Specialty Notifications Start End  Sheliah Hatch, MD PCP - General Family Medicine  07/06/16   Carman Ching, MD (Inactive) Consulting Physician Gastroenterology  07/06/16   Jerene Bears, MD Consulting Physician Gynecology  07/06/16   Dahlia Byes, [facility] Pharmacist Pharmacist Admissions 10/02/19    Comment: phone number [phone]  Wanda Plump, RN [facility] Care Management  Admissions 12/23/19    Current Diagnosis/Assessment: Goals Addressed            This Visit's Progress   . A1c <7%   On  track    CARE PLAN ENTRY . Diabetes: type 2 Lab Results  Component Value Date   HGBA1C 7.3 (H) 11/19/2019   Lab Results  Component Value Date   CREATININE 1.08 11/19/2019   CREATININE 1.17 (H) 11/09/2019   CREATININE 1.15 05/25/2019 .  Current antihyperglycemic regimen:  Metformin XL 500 mg two tabs (1000 mg) twice daily  Glipizide IR 5 mg every morning before breakfast, 5 mg before supper   Pharmacist Clinical Goal(s):  [name] Over the next 180 days, patient will work with PharmD and primary care provider as needed to achieve A1c<7% . PAP  application coordination on Farxiga  Interventions: . Comprehensive medication review performed, medication list updated in electronic medical record . Inter-disciplinary care team collaboration (see longitudinal plan of care)  Patient Self Care Activities:  . Reduce consumption of sweets . Patient will check blood glucose every morning, document, and provide at future appointments . Patient will focus on medication adherence by continuing current medication management  . Patient will take medications as prescribed . Patient will contact provider with any episodes of hypoglycemia . Patient will report any questions or concerns to provider   Initial goal documentation.    . BP <140/90   On track    CARE PLAN ENTRY (see longitudinal plan of care for additional care plan information)  Current Barriers:  . Uncontrolled hypertension, complicated by Chronic Kidney Disease, Diabetes, Heart Failure . Current antihypertensive regimen: amlodipine 10 mg, carvedilol 25 mg twice daily, furosemide 20 mg daily . Previously on RAASi therapy with spironolactone and losartan but was stopped after developing AKI.  [name] Last practice recorded BP readings:  BP Readings from Last 3 Encounters:  11/19/19 138/82  11/09/19 122/60  08/28/19 136/78   Pharmacist Clinical Goal(s):  [name] Over the next 180 days, patient will work with PharmD and providers to optimize  antihypertensive regimen.  Interventions: . Inter-disciplinary care team collaboration (see longitudinal plan of care). . Comprehensive medication review performed; medication list updated in the electronic medical record.  . Monitor BP daily at 1pm unless instructed otherwise at May cardiology appointment.   Patient Self Care Activities:  . Patient will continue to check BP daily, document, and provide at future appointments. . Patient will focus on medication adherence by continuing with current medication management. . Consider pharmacy services we discussed to help with medication organization through pill packaging and medication delivery. . Continue daily weights for heart failure.  Initial goal documentation.      Hypertension / HF   BP Readings from Last 3 Encounters:  03/04/20 (!) 150/70  02/09/20 (!) 152/66  12/30/19 120/70   EF>65% 02/2019. Patient has been checking home BP daily. Note readings below.  Previously on RAAS therapy with spironolactone and losartan but was stopped after developing AKI.   Recent home readings:  130s-140s/70s-80s, denies >150/90   Previously Reported Home BPs:   Morning (4/26-4/29): 135-141/57-67  Afternoon (4/14-4/16): 148-164/65-71  Currently taking:  Amlodipine 10 mg daily  Carvedilol 25 mg twice daily  Plan  Continue current medications.   Diabetes   A1c goal <7%  Lab Results  Component Value Date/Time   HGBA1C 7.2 (A) 03/04/2020 01:39 PM   HGBA1C 7.3 (H) 11/19/2019 02:50 PM   HGBA1C 6.7 (A) 08/28/2019 02:17 PM   HGBA1C 8.0 (H) 03/06/2019 04:40 AM   HGBA1C 6.7 02/06/2016 12:00 AM   MICROALBUR 4.7 (H) 05/25/2019 01:52 PM     Lab Results  Component Value Date/Time   HMDIABEYEEXA No Retinopathy 07/28/2019 12:00 AM    Lab Results  Component Value Date/Time   HMDIABFOOTEX completed 01/24/2016 12:00 AM   a1c not at goal. slight decrease to 7.2% on 03/04/2020 from 7.3% 10/2019.  Testing twice daily - once before  breakfast, once before bed.  Provided 10 readings over past ~5 days, overall - 141, 295, 132, 105, 130, 110, 131, 131, 146, 184.  Denies lows following glipizide dose increase.  Suspects that sweets might be contributing to her increased a1c - will cut down on icecream. Denies any hypoglycemia within the last month.  Previously on Actos (stopped-HF), Glipizide XL (stopped-minimize  hypoglycemia), RAASi hx (stopped-AKI).  Patient is currently taking:   Metformin XL 500 mg two tabs (1000 mg) twice daily  Glipizide IR 5 mg every morning before breakfast and and before supper   We discussed: diet/exercise, limited exercise due to hip/knee pain, plans to do pool exercises at [employer]. Starting PT on Monday. Occasional slips with sugary snacks. Also discussed [facility] - pt plans to contact to help with medicare/LIS options. Agreed to fill out farxiga PAP application together - reviewed eligibility and pt is expected to qualify.  Plan  Continue current medications.  Farxiga PAP.  Medication Management / Care Coordination    Pt uses [facility] for all medications Uses pill box? Yes.  Plan  Continue current medication management strategy.  Pt to contact [employer] to review Medicare plan options.   Will coordinate PAP application on Farxiga.   Follow up:  RPH: 3 month phone visit, PAP/insurance questions prn. CPA: Farxiga PAP, October DM/BP ______________ Visit Information SDOH (Social Determinants of Health) assessments performed: Yes.  Future Appointments  Date Time Provider Department Center  03/18/2020 11:00 AM Wine, Pryor Ochoa, RN [facility] None  05/23/2020  2:00 PM Sheliah Hatch, MD [facility]  06/02/2020  2:40 PM [place], [name], MD [facility]  06/03/2020 12:30 PM [facility] CCM PHARMACIST [facility]  06/16/2020  2:30 PM Waymon Budge, MD [facility] None  09/02/2020 10:10 AM Shamleffer, Konrad Dolores, MD [facility] None   Dahlia Byes, Pharm.D., BCGP Clinical  Pharmacist [facility] [phone]

## 2020-03-11 ENCOUNTER — Telehealth: Payer: Self-pay

## 2020-03-11 NOTE — Progress Notes (Signed)
Chronic Care Management Pharmacy Assistant   Name: Kayla Brown  MRN: [id number] DOB: 10/28/1950  Reason for Encounter: Patient Assistance Application   PCP : Sheliah Hatch, MD  Allergies:  No Known Allergies  Medications: Outpatient Encounter Medications as of 03/11/2020  Medication Sig Note  . albuterol (PROAIR HFA) 108 (90 Base) MCG/ACT inhaler Inhale 1-2 puffs into the lungs every 6 (six) hours as needed for wheezing or shortness of breath.   . Alcohol Swabs (ALCOHOL PREP) PADS Pt uses an alcohol pad each time sugars are tested. Pt tests twice daily. Dx E11.9   . amLODipine (NORVASC) 10 MG tablet TAKE 1 TABLET BY MOUTH EVERY DAY   . atorvastatin (LIPITOR) 40 MG tablet TAKE 1 TABLET BY MOUTH EVERY DAY   . carvedilol (COREG) 25 MG tablet Take 1 tablet (25 mg total) by mouth 2 (two) times daily.   . Cholecalciferol (VITAMIN D PO) Take 2,000 Units by mouth daily.    . cyclobenzaprine (FLEXERIL) 10 MG tablet TAKE 1 TABLET BY MOUTH EVERYDAY AT BEDTIME   . fish oil-omega-3 fatty acids 1000 MG capsule Take 1 g by mouth daily.   . fluticasone (FLONASE) 50 MCG/ACT nasal spray SPRAY 2 SPRAYS INTO EACH NOSTRIL EVERY DAY (Patient taking differently: Place 2 sprays into both nostrils daily. )   . furosemide (LASIX) 20 MG tablet TAKE 1 TABLET BY MOUTH EVERY DAY   . glipiZIDE (GLUCOTROL) 5 MG tablet Take 1 tablet (5 mg total) by mouth 2 (two) times daily before a meal.   . glucose blood (FREESTYLE LITE) test strip USE TO TEST BLOOD GLUCOSE 2 TIMES DAILY. Dx E11.9   . loratadine (CLARITIN) 10 MG tablet TAKE 1 TABLET BY MOUTH EVERY DAY (Patient taking differently: Take 10 mg by mouth daily. )   . Melatonin 3 MG TABS Take 10 mg by mouth at bedtime.    . meloxicam (MOBIC) 15 MG tablet TAKE 1 TABLET BY MOUTH EVERY DAY   . metFORMIN (GLUCOPHAGE-XR) 500 MG 24 hr tablet Take 2 tablets (1,000 mg total) by mouth in the morning and at bedtime.   . Multiple Vitamins-Minerals (VISION FORMULA) TABS  Take 1 tablet by mouth daily.    [name] OVER THE COUNTER MEDICATION Take 1 tablet by mouth daily. Equate Vision Formula 50+    . predniSONE (STERAPRED UNI-PAK 21 TAB) 10 MG (21) TBPK tablet Take as directed 01/19/2020: Completed  . sertraline (ZOLOFT) 50 MG tablet TAKE 1 TABLET BY MOUTH EVERY DAY   . traMADol (ULTRAM) 50 MG tablet Take 1 tablet (50 mg total) by mouth every 6 (six) hours as needed.   . traMADol (ULTRAM) 50 MG tablet Take 1-2 tablets (50-100 mg total) by mouth daily as needed.   . TRUEPLUS LANCETS 30G MISC Use one lancet each time sugars are tested. Pt tests twice daily. Dx. E11.9   . UNABLE TO FIND Med Name: Healthwise talking meter    No facility-administered encounter medications on file as of 03/11/2020.    Current Diagnosis: Patient Active Problem List   Diagnosis Date Noted  . Left hip pain 12/01/2019  . Type 2 diabetes mellitus with stage 3b chronic kidney disease, without long-term current use of insulin (HCC) 04/03/2019  . Diastolic CHF (HCC) 03/18/2019  . Dyspnea 03/08/2019  . Pulmonary edema 03/04/2019  . Physical exam 02/06/2017  . Anxiety and depression 12/05/2016  . Morbid obesity (HCC) 03/01/2016  . OSA (obstructive sleep apnea) 07/11/2015  . Pulmonary hypertension (HCC) 05/06/2015  .  Heart murmur 03/03/2015  . Essential hypertension, benign 03/18/2014  . Hyperlipidemia 03/18/2014  . Post-menopause on HRT (hormone replacement therapy) 03/18/2014    Patient assistance application completed and in patients folder for Cpp review and ready to be mailed . Spoke with patient , Informed patient regarding paperwork being mailed out and instructed patient to bring to office after completion.     Aloha Gell ,CMA Clinical Pharmacist Assistant [phone]      Follow-Up:  Pharmacist Review

## 2020-03-14 ENCOUNTER — Telehealth: Payer: Self-pay

## 2020-03-14 DIAGNOSIS — M6281 Muscle weakness (generalized): Secondary | ICD-10-CM | POA: Diagnosis not present

## 2020-03-14 DIAGNOSIS — M5416 Radiculopathy, lumbar region: Secondary | ICD-10-CM | POA: Diagnosis not present

## 2020-03-14 DIAGNOSIS — M545 Low back pain: Secondary | ICD-10-CM | POA: Diagnosis not present

## 2020-03-14 NOTE — Progress Notes (Addendum)
Chronic Care Management Pharmacy Assistant   Name: Kayla Brown  MRN: [id number] DOB: 08-07-1950  Reason for Encounter: Patient Assistance Applications   PCP : Sheliah Hatch, MD  Allergies:  No Known Allergies  Medications: Outpatient Encounter Medications as of 03/14/2020  Medication Sig Note   albuterol (PROAIR HFA) 108 (90 Base) MCG/ACT inhaler Inhale 1-2 puffs into the lungs every 6 (six) hours as needed for wheezing or shortness of breath.    Alcohol Swabs (ALCOHOL PREP) PADS Pt uses an alcohol pad each time sugars are tested. Pt tests twice daily. Dx E11.9    amLODipine (NORVASC) 10 MG tablet TAKE 1 TABLET BY MOUTH EVERY DAY    atorvastatin (LIPITOR) 40 MG tablet TAKE 1 TABLET BY MOUTH EVERY DAY    carvedilol (COREG) 25 MG tablet Take 1 tablet (25 mg total) by mouth 2 (two) times daily.    Cholecalciferol (VITAMIN D PO) Take 2,000 Units by mouth daily.     cyclobenzaprine (FLEXERIL) 10 MG tablet TAKE 1 TABLET BY MOUTH EVERYDAY AT BEDTIME    fish oil-omega-3 fatty acids 1000 MG capsule Take 1 g by mouth daily.    fluticasone (FLONASE) 50 MCG/ACT nasal spray SPRAY 2 SPRAYS INTO EACH NOSTRIL EVERY DAY (Patient taking differently: Place 2 sprays into both nostrils daily. )    furosemide (LASIX) 20 MG tablet TAKE 1 TABLET BY MOUTH EVERY DAY    glipiZIDE (GLUCOTROL) 5 MG tablet Take 1 tablet (5 mg total) by mouth 2 (two) times daily before a meal.    glucose blood (FREESTYLE LITE) test strip USE TO TEST BLOOD GLUCOSE 2 TIMES DAILY. Dx E11.9    loratadine (CLARITIN) 10 MG tablet TAKE 1 TABLET BY MOUTH EVERY DAY (Patient taking differently: Take 10 mg by mouth daily. )    Melatonin 3 MG TABS Take 10 mg by mouth at bedtime.     meloxicam (MOBIC) 15 MG tablet TAKE 1 TABLET BY MOUTH EVERY DAY    metFORMIN (GLUCOPHAGE-XR) 500 MG 24 hr tablet Take 2 tablets (1,000 mg total) by mouth in the morning and at bedtime.    Multiple Vitamins-Minerals (VISION FORMULA) TABS Take 1 tablet by  mouth daily.     OVER THE COUNTER MEDICATION Take 1 tablet by mouth daily. Equate Vision Formula 50+     predniSONE (STERAPRED UNI-PAK 21 TAB) 10 MG (21) TBPK tablet Take as directed 01/19/2020: Completed   sertraline (ZOLOFT) 50 MG tablet TAKE 1 TABLET BY MOUTH EVERY DAY    traMADol (ULTRAM) 50 MG tablet Take 1 tablet (50 mg total) by mouth every 6 (six) hours as needed.    traMADol (ULTRAM) 50 MG tablet Take 1-2 tablets (50-100 mg total) by mouth daily as needed.    TRUEPLUS LANCETS 30G MISC Use one lancet each time sugars are tested. Pt tests twice daily. Dx. E11.9    UNABLE TO FIND Med Name: Healthwise talking meter    No facility-administered encounter medications on file as of 03/14/2020.    Current Diagnosis: Patient Active Problem List   Diagnosis Date Noted   Left hip pain 12/01/2019   Type 2 diabetes mellitus with stage 3b chronic kidney disease, without long-term current use of insulin (HCC) 04/03/2019   Diastolic CHF (HCC) 03/18/2019   Dyspnea 03/08/2019   Pulmonary edema 03/04/2019   Physical exam 02/06/2017   Anxiety and depression 12/05/2016   Morbid obesity (HCC) 03/01/2016   OSA (obstructive sleep apnea) 07/11/2015   Pulmonary hypertension (HCC) 05/06/2015  Heart murmur 03/03/2015   Essential hypertension, benign 03/18/2014   Hyperlipidemia 03/18/2014   Post-menopause on HRT (hormone replacement therapy) 03/18/2014    Patient assistance application completed and in Pharmacist  Dahlia Byes, CPP folder for review and be mailed out to patient.  Spoke with patient , Patient aware of paperwork coming through the mail. Informed patient to mail or drop papers to office once sections are completed .  Aloha Gell ,CMA Clinical Pharmacist Assistant [phone]  Follow-Up:  Pharmacist Review  PAP reviewed and sent to patient.

## 2020-03-18 ENCOUNTER — Other Ambulatory Visit: Payer: Self-pay | Admitting: *Deleted

## 2020-03-18 DIAGNOSIS — M545 Low back pain: Secondary | ICD-10-CM | POA: Diagnosis not present

## 2020-03-18 DIAGNOSIS — M5416 Radiculopathy, lumbar region: Secondary | ICD-10-CM | POA: Diagnosis not present

## 2020-03-18 DIAGNOSIS — M6281 Muscle weakness (generalized): Secondary | ICD-10-CM | POA: Diagnosis not present

## 2020-03-18 NOTE — Patient Outreach (Signed)
[facility]) Care Management  03/18/2020  Shamiracle Gorden 10/22/1950 [id number]  Unsuccessful outreach attempt made to patient. RN Health Coach left HIPAA compliant voicemail message along with her contact information.  Plan: RN Health Coach will call patient within the month of October.  Blanchie Serve RN, BSN [facility] Care Management  RN Health Coach [phone] [email or web address]

## 2020-03-22 DIAGNOSIS — M6281 Muscle weakness (generalized): Secondary | ICD-10-CM | POA: Diagnosis not present

## 2020-03-22 DIAGNOSIS — M545 Low back pain: Secondary | ICD-10-CM | POA: Diagnosis not present

## 2020-03-22 DIAGNOSIS — M5416 Radiculopathy, lumbar region: Secondary | ICD-10-CM | POA: Diagnosis not present

## 2020-03-24 DIAGNOSIS — M6281 Muscle weakness (generalized): Secondary | ICD-10-CM | POA: Diagnosis not present

## 2020-03-24 DIAGNOSIS — M5416 Radiculopathy, lumbar region: Secondary | ICD-10-CM | POA: Diagnosis not present

## 2020-03-24 DIAGNOSIS — M545 Low back pain: Secondary | ICD-10-CM | POA: Diagnosis not present

## 2020-03-25 ENCOUNTER — Ambulatory Visit: Payer: Self-pay

## 2020-03-25 NOTE — Progress Notes (Signed)
   Chronic Care Management   Patient Assistance Application  Name: Rickell Wiehe MRN: [id number] DOB: May 24, 1951  PCP: Sheliah Hatch, MD  [facility] PAP application-   Patient has completed patient section of application.   Remaining portions filled out and faxed to Dr. Lonzo Cloud to sign and date and fax to [employer].  Dahlia Byes, Pharm.D., BCGP Clinical Pharmacist [facility] [phone]

## 2020-03-28 ENCOUNTER — Ambulatory Visit (INDEPENDENT_AMBULATORY_CARE_PROVIDER_SITE_OTHER): Payer: Medicare Other

## 2020-03-28 ENCOUNTER — Other Ambulatory Visit: Payer: Self-pay

## 2020-03-28 DIAGNOSIS — Z23 Encounter for immunization: Secondary | ICD-10-CM

## 2020-03-29 DIAGNOSIS — M6281 Muscle weakness (generalized): Secondary | ICD-10-CM | POA: Diagnosis not present

## 2020-03-29 DIAGNOSIS — M5416 Radiculopathy, lumbar region: Secondary | ICD-10-CM | POA: Diagnosis not present

## 2020-03-30 ENCOUNTER — Telehealth: Payer: Self-pay

## 2020-03-30 NOTE — Progress Notes (Signed)
Chronic Care Management Pharmacy Assistant   Name: Pamalee Marcoe  MRN: [id number] DOB: 1951-01-22  Reason for Encounter: Disease State   PCP : Sheliah Hatch, MD  Allergies:  No Known Allergies  Medications: Outpatient Encounter Medications as of 03/30/2020  Medication Sig Note   albuterol (PROAIR HFA) 108 (90 Base) MCG/ACT inhaler Inhale 1-2 puffs into the lungs every 6 (six) hours as needed for wheezing or shortness of breath.    Alcohol Swabs (ALCOHOL PREP) PADS Pt uses an alcohol pad each time sugars are tested. Pt tests twice daily. Dx E11.9    amLODipine (NORVASC) 10 MG tablet TAKE 1 TABLET BY MOUTH EVERY DAY    atorvastatin (LIPITOR) 40 MG tablet TAKE 1 TABLET BY MOUTH EVERY DAY    carvedilol (COREG) 25 MG tablet Take 1 tablet (25 mg total) by mouth 2 (two) times daily.    Cholecalciferol (VITAMIN D PO) Take 2,000 Units by mouth daily.     cyclobenzaprine (FLEXERIL) 10 MG tablet TAKE 1 TABLET BY MOUTH EVERYDAY AT BEDTIME    fish oil-omega-3 fatty acids 1000 MG capsule Take 1 g by mouth daily.    fluticasone (FLONASE) 50 MCG/ACT nasal spray SPRAY 2 SPRAYS INTO EACH NOSTRIL EVERY DAY (Patient taking differently: Place 2 sprays into both nostrils daily. )    furosemide (LASIX) 20 MG tablet TAKE 1 TABLET BY MOUTH EVERY DAY    glipiZIDE (GLUCOTROL) 5 MG tablet Take 1 tablet (5 mg total) by mouth 2 (two) times daily before a meal.    glucose blood (FREESTYLE LITE) test strip USE TO TEST BLOOD GLUCOSE 2 TIMES DAILY. Dx E11.9    loratadine (CLARITIN) 10 MG tablet TAKE 1 TABLET BY MOUTH EVERY DAY (Patient taking differently: Take 10 mg by mouth daily. )    Melatonin 3 MG TABS Take 10 mg by mouth at bedtime.     meloxicam (MOBIC) 15 MG tablet TAKE 1 TABLET BY MOUTH EVERY DAY    metFORMIN (GLUCOPHAGE-XR) 500 MG 24 hr tablet Take 2 tablets (1,000 mg total) by mouth in the morning and at bedtime.    Multiple Vitamins-Minerals (VISION FORMULA) TABS Take 1 tablet by  mouth daily.     OVER THE COUNTER MEDICATION Take 1 tablet by mouth daily. Equate Vision Formula 50+     predniSONE (STERAPRED UNI-PAK 21 TAB) 10 MG (21) TBPK tablet Take as directed 01/19/2020: Completed   sertraline (ZOLOFT) 50 MG tablet TAKE 1 TABLET BY MOUTH EVERY DAY    traMADol (ULTRAM) 50 MG tablet Take 1 tablet (50 mg total) by mouth every 6 (six) hours as needed.    traMADol (ULTRAM) 50 MG tablet Take 1-2 tablets (50-100 mg total) by mouth daily as needed.    TRUEPLUS LANCETS 30G MISC Use one lancet each time sugars are tested. Pt tests twice daily. Dx. E11.9    UNABLE TO FIND Med Name: Healthwise talking meter    No facility-administered encounter medications on file as of 03/30/2020.    Current Diagnosis: Patient Active Problem List   Diagnosis Date Noted   Left hip pain 12/01/2019   Type 2 diabetes mellitus with stage 3b chronic kidney disease, without long-term current use of insulin (HCC) 04/03/2019   Diastolic CHF (HCC) 03/18/2019   Dyspnea 03/08/2019   Pulmonary edema 03/04/2019   Physical exam 02/06/2017   Anxiety and depression 12/05/2016   Morbid obesity (HCC) 03/01/2016   OSA (obstructive sleep apnea) 07/11/2015   Pulmonary hypertension (HCC) 05/06/2015   Heart  murmur 03/03/2015   Essential hypertension, benign 03/18/2014   Hyperlipidemia 03/18/2014   Post-menopause on HRT (hormone replacement therapy) 03/18/2014     Recent Relevant Labs: Lab Results  Component Value Date/Time   HGBA1C 7.2 (A) 03/04/2020 01:39 PM   HGBA1C 7.3 (H) 11/19/2019 02:50 PM   HGBA1C 6.7 (A) 08/28/2019 02:17 PM   HGBA1C 8.0 (H) 03/06/2019 04:40 AM   HGBA1C 6.7 02/06/2016 12:00 AM   MICROALBUR 4.7 (H) 05/25/2019 01:52 PM    Kidney Function Lab Results  Component Value Date/Time   CREATININE 1.08 11/19/2019 02:50 PM   CREATININE 1.17 (H) 11/09/2019 02:33 PM   CREATININE 0.97 03/30/2015 01:48 PM   GFR 50.50 (L) 11/19/2019 02:50 PM   GFRNONAA 48 (L)  11/09/2019 02:33 PM   GFRAA 56 (L) 11/09/2019 02:33 PM     Current antihyperglycemic regimen:  glipiZIDE (GLUCOTROL) 5 MG tablet metFORMIN (GLUCOPHAGE-XR) 500 MG 24 hr tablet  What recent interventions/DTPs have been made to improve glycemic control:  o None Noted  Have there been any recent hospitalizations or ED visits since last visit with CPP? No  Patient denies hypoglycemic symptoms, including None  Patient denies hyperglycemic symptoms, including none  How often are you checking your blood sugar? once daily and twice daily  What are your blood sugars ranging?  o Fasting: n/a o Before meals: 190, 110, 140.  o After meals: n/a o Bedtime: n/a  During the week, how often does your blood glucose drop below 70? Never  Are you checking your feet daily/regularly?   Adherence Review: Is the patient currently on a STATIN medication? Yes Is the patient currently on ACE/ARB medication? Yes Does the patient have >5 day gap between last estimated fill dates? No   Jomayra L Darnelle Spangle ,[place] Clinical Pharmacist Assistant [phone]      Follow-Up:  Pharmacist Review

## 2020-03-31 DIAGNOSIS — M6281 Muscle weakness (generalized): Secondary | ICD-10-CM | POA: Diagnosis not present

## 2020-03-31 DIAGNOSIS — M5416 Radiculopathy, lumbar region: Secondary | ICD-10-CM | POA: Diagnosis not present

## 2020-04-01 ENCOUNTER — Other Ambulatory Visit: Payer: Self-pay | Admitting: *Deleted

## 2020-04-01 NOTE — Patient Outreach (Signed)
[facility]) Care Management  [facility]  04/01/2020   Kayla Brown 05/02/1951 [id number]  Subjective: Successful telephone outreach call to patient. HIPAA identifiers obtained. Patient reports doing well. She explained that her husband is responding well to infusion treatment for guillain barre syndrome, he is much more independent, and driving.  Patient states that she continues to weigh daily and record her values. Her weight fluctuates from 175-179 and she admits she could be doing better with limiting the amount of sodium she eats. Adding that her provider advised her to decrease snacking and to eat 3 meals a day. Patient states she is doing her best to limiting her snacking, she is more motivated to increase the amount of fruits and vegetables she eats and will begin to drink more water. Patient does have an action plan with her cardiologist to take additional lasix with weight gain, fluid restrict to 1400 cc daily, and to keep her sodium intake to 2000 mg daily. She also follows the [facility] CHF zones and action plans. Per patient she has begun PT x 2 weekly to help with her back and hip pain and to gain strength and mobility. She reports that her pain has improved and that she will try to do the PT exercises at home on the days she does not have to go to PT. Patient denies having any falls or needs for DME, states her home environment is safe, and that she feels good emotionally. Patient did not have any further questions or concerns and did confirm that she has this nurse's contact number to call her if needed.   Encounter Medications:  Outpatient Encounter Medications as of 04/01/2020  Medication Sig Note  . albuterol (PROAIR HFA) 108 (90 Base) MCG/ACT inhaler Inhale 1-2 puffs into the lungs every 6 (six) hours as needed for wheezing or shortness of breath.   . Alcohol Swabs (ALCOHOL PREP) PADS Pt uses an alcohol pad each time sugars are tested. Pt tests twice daily. Dx  E11.9   . amLODipine (NORVASC) 10 MG tablet TAKE 1 TABLET BY MOUTH EVERY DAY   . atorvastatin (LIPITOR) 40 MG tablet TAKE 1 TABLET BY MOUTH EVERY DAY   . carvedilol (COREG) 25 MG tablet Take 1 tablet (25 mg total) by mouth 2 (two) times daily.   . Cholecalciferol (VITAMIN D PO) Take 2,000 Units by mouth daily.    . cyclobenzaprine (FLEXERIL) 10 MG tablet TAKE 1 TABLET BY MOUTH EVERYDAY AT BEDTIME   . fish oil-omega-3 fatty acids 1000 MG capsule Take 1 g by mouth daily.   . fluticasone (FLONASE) 50 MCG/ACT nasal spray SPRAY 2 SPRAYS INTO EACH NOSTRIL EVERY DAY (Patient taking differently: Place 2 sprays into both nostrils daily. )   . furosemide (LASIX) 20 MG tablet TAKE 1 TABLET BY MOUTH EVERY DAY   . glipiZIDE (GLUCOTROL) 5 MG tablet Take 1 tablet (5 mg total) by mouth 2 (two) times daily before a meal.   . glucose blood (FREESTYLE LITE) test strip USE TO TEST BLOOD GLUCOSE 2 TIMES DAILY. Dx E11.9   . loratadine (CLARITIN) 10 MG tablet TAKE 1 TABLET BY MOUTH EVERY DAY (Patient taking differently: Take 10 mg by mouth daily. )   . Melatonin 3 MG TABS Take 10 mg by mouth at bedtime.    . meloxicam (MOBIC) 15 MG tablet TAKE 1 TABLET BY MOUTH EVERY DAY   . metFORMIN (GLUCOPHAGE-XR) 500 MG 24 hr tablet Take 2 tablets (1,000 mg total) by mouth in  the morning and at bedtime.   . Multiple Vitamins-Minerals (VISION FORMULA) TABS Take 1 tablet by mouth daily.    [name] OVER THE COUNTER MEDICATION Take 1 tablet by mouth daily. Equate Vision Formula 50+    . predniSONE (STERAPRED UNI-PAK 21 TAB) 10 MG (21) TBPK tablet Take as directed 01/19/2020: Completed  . sertraline (ZOLOFT) 50 MG tablet TAKE 1 TABLET BY MOUTH EVERY DAY   . traMADol (ULTRAM) 50 MG tablet Take 1 tablet (50 mg total) by mouth every 6 (six) hours as needed.   . traMADol (ULTRAM) 50 MG tablet Take 1-2 tablets (50-100 mg total) by mouth daily as needed.   . TRUEPLUS LANCETS 30G MISC Use one lancet each time sugars are tested. Pt tests twice daily.  Dx. E11.9   . UNABLE TO FIND Med Name: Healthwise talking meter    No facility-administered encounter medications on file as of 04/01/2020.    Functional Status:  In your present state of health, do you have any difficulty performing the following activities: 01/19/2020 11/19/2019  Hearing? Y N  Comment has hearing aids -  Vision? N N  Difficulty concentrating or making decisions? N N  Walking or climbing stairs? Y N  Comment patient uses a walker with long distances and does have difficulty climbing stairs -  Dressing or bathing? N N  Doing errands, shopping? N Y  Quarry manager and eating ? N N  Using the Toilet? N N  In the past six months, have you accidently leaked urine? N N  Do you have problems with loss of bowel control? N N  Managing your Medications? N N  Managing your Finances? N N  Housekeeping or managing your Housekeeping? N N  Some recent data might be hidden    Fall/Depression Screening: Fall Risk  04/01/2020 04/01/2020 01/19/2020  Falls in the past year? 1 0 0  Number falls in past yr: 0 0 0  Comment - - -  Injury with Fall? 0 0 0  Risk for fall due to : History of fall(s);Impaired balance/gait;Impaired mobility History of fall(s) Impaired mobility  Follow up Falls evaluation completed;Education provided;Falls prevention discussed Falls prevention discussed;Education provided;Falls evaluation completed Falls prevention discussed;Education provided;Falls evaluation completed   PHQ 2/9 Scores 04/01/2020 01/19/2020 11/19/2019 05/25/2019 03/20/2019 03/17/2019 02/05/2019  PHQ - 2 Score 0 0 0 0 0 0 0  PHQ- 9 Score - - 0 0 0 - 0    Assessment:  Goals Addressed            This Visit's Progress   . Eat Healthy       Follow Up Date 06/23/20    - set goal weight - manage portion size - prepare main meal at home 3 to 5 days each week - read food labels for fat, fiber, carbohydrates and portion size - reduce red meat to 2 to 3 times a week - set a realistic goal     Why is this important?   When you are ready to manage your nutrition or weight, having a plan and setting goals will help.  Taking small steps to change how you eat and exercise is a good place to start.    Notes: Patient states she is motivated to increase the amount of fruits an vegetables she consumes, she will decrease the amount of snacking, and will increase the amount of water she drinks.    . Make and Keep All Appointments       Follow Up Date  06/23/20    - call to cancel if needed - keep a calendar with appointment dates    Why is this important?   Part of staying healthy is seeing the doctor for follow-up care.  If you forget your appointments, there are some things you can do to stay on track.    Notes: Patient states she does not have any difficulty managing her provider appointments.    . Manage My Cholesterol       Follow Up Date 06/23/20    - eat smaller or less servings of red meat - increase the amount of fiber in food - read food labels for fat and fiber - switch to low-fat or skim milk    Why is this important?   Changing cholesterol starts with eating heart-healthy foods.  Other steps may be to increase your activity and to quit if you smoke.    Notes:     . COMPLETED: Patient will not go to the ED or Hospital due to CHF exacerbation within the next 90 days       CARE PLAN ENTRY (see longtitudinal plan of care for additional care plan information)   Current Barriers:  [name] Knowledge deficit related to basic heart failure pathophysiology and self care management  Case Manager Clinical Goal(s):  [name] Over the next 60 days, patient will verbalize understanding of Heart Failure Action Plan and when to call doctor . Patient states she does have the written [facility] CHF Zones and actions plans . Over the next 60 days patient will verbalize adhering to low sodium diet . Over the next 60 days patient will verbalize increasing her physical activity as   tolerated   Interventions:  . Provided verbal education on low sodium diet . Reviewed Heart Failure Action Plan in depth and provided written copy . Discussed importance of daily weight and advised patient to weigh and record daily . Reviewed role of diuretics in prevention of fluid overload and management of heart failure  Patient Self Care Activities:  . Takes Heart Failure Medications as prescribed . Weighs daily and record (notifying MD of 3 lb weight gain over night or 5 lb in a week) . Verbalizes understanding of and follows CHF Action Plan . Patient reports that she has not been following a low sodium diet closely, she has not been able to exercise due to lower back pain, and as a result she has gained close to 5 pounds of fluid weight in about a week. Patient explains that per her cardiology instructions she is taking extra lasix which she just started today and she will call cardiology if she does not improve or if she develops additional CHF symptoms.  Initial goal documentation Resolved due to duplicate goals.     . Track and Manage Fluids and Swelling       Follow Up Date 06/23/20    - call office if I gain more than 2 pounds in one day or 5 pounds in one week - keep legs up while sitting - track weight in diary - use salt in moderation - watch for swelling in feet, ankles and legs every day - weigh myself daily    Why is this important?   It is important to check your weight daily and watch how much salt and liquids you have.  It will help you to manage your heart failure.    Notes: Patient weighs and records daily, she does her best to limit her sodium intake, and  does call her provider with weight gain or worsening CHF symptoms    . Track and Manage My Blood Pressure       Follow Up Date 06/23/20    - check blood pressure daily - write blood pressure results in a log or diary    Why is this important?   You won't feel high blood pressure, but it can still hurt  your blood vessels.  High blood pressure can cause heart or kidney problems. It can also cause a stroke.  Making lifestyle changes like losing a little weight or eating less salt will help.  Checking your blood pressure at home and at different times of the day can help to control blood pressure.  If the doctor prescribes medicine remember to take it the way the doctor ordered.  Call the office if you cannot afford the medicine or if there are questions about it.     Notes: Patient takes her B/P daily and records the value.     . Track and Manage Symptoms       Follow Up Date 06/23/20   - begin a heart failure diary - bring diary to all appointments - develop a rescue plan - eat more whole grains, fruits and vegetables, lean meats and healthy fats - follow rescue plan if symptoms flare-up - know when to call the doctor - track symptoms and what helps feel better or worse    Why is this important?   You will be able to handle your symptoms better if you keep track of them.  Making some simple changes to your lifestyle will help.  Eating healthy is one thing you can do to take good care of yourself.    Notes: Patient has developed an action plan with her cardiologist, she follows the [facility] CHF zones and action plans. She states she is going to PT x 2 weekly and she will do the PT exercises at home on her off days.       Plan: RN Health Coach will send patient Advance Directive Documents, will call patient within the month of December,  and patient agrees to future outreach calls.   Blanchie Serve RN, BSN [facility] Care Management  RN Health Coach [phone] [email or web address]

## 2020-04-05 DIAGNOSIS — M5416 Radiculopathy, lumbar region: Secondary | ICD-10-CM | POA: Diagnosis not present

## 2020-04-05 DIAGNOSIS — M6281 Muscle weakness (generalized): Secondary | ICD-10-CM | POA: Diagnosis not present

## 2020-04-07 DIAGNOSIS — M5416 Radiculopathy, lumbar region: Secondary | ICD-10-CM | POA: Diagnosis not present

## 2020-04-07 DIAGNOSIS — M6281 Muscle weakness (generalized): Secondary | ICD-10-CM | POA: Diagnosis not present

## 2020-04-11 DIAGNOSIS — M6281 Muscle weakness (generalized): Secondary | ICD-10-CM | POA: Diagnosis not present

## 2020-04-11 DIAGNOSIS — M5416 Radiculopathy, lumbar region: Secondary | ICD-10-CM | POA: Diagnosis not present

## 2020-04-13 ENCOUNTER — Telehealth: Payer: Self-pay | Admitting: Internal Medicine

## 2020-04-13 NOTE — Telephone Encounter (Signed)
Patient called to advise that she received her Kayla Brown and she is going to start taking it 04/14/20.  She will be discontinuing the Glipizide and Metformin.  Patient received 3 months worth and states we will need to re-apply for assistance at the end of the year  Call back number is [phone]

## 2020-04-14 DIAGNOSIS — M5416 Radiculopathy, lumbar region: Secondary | ICD-10-CM | POA: Diagnosis not present

## 2020-04-14 DIAGNOSIS — M6281 Muscle weakness (generalized): Secondary | ICD-10-CM | POA: Diagnosis not present

## 2020-04-14 NOTE — Telephone Encounter (Signed)
Spoken to patient and she stated Dahlia Byes (pharmacist) was the one who help her get Rx. Patient thought that during last visit with Dr Lonzo Cloud this was discuss as a possible change in therapy.   Patient stated that she would like to discuss in person. She will continue to take glipizide and metformin until she is seen. I have schedule an appointment on Monday 04/18/2020 as requested.

## 2020-04-18 ENCOUNTER — Encounter: Payer: Self-pay | Admitting: Internal Medicine

## 2020-04-18 ENCOUNTER — Ambulatory Visit (INDEPENDENT_AMBULATORY_CARE_PROVIDER_SITE_OTHER): Payer: Medicare Other | Admitting: Internal Medicine

## 2020-04-18 ENCOUNTER — Other Ambulatory Visit: Payer: Self-pay

## 2020-04-18 VITALS — BP 142/72 | HR 86 | Ht 60.0 in | Wt 180.0 lb

## 2020-04-18 DIAGNOSIS — Z794 Long term (current) use of insulin: Secondary | ICD-10-CM | POA: Diagnosis not present

## 2020-04-18 DIAGNOSIS — E1165 Type 2 diabetes mellitus with hyperglycemia: Secondary | ICD-10-CM

## 2020-04-18 DIAGNOSIS — N1832 Chronic kidney disease, stage 3b: Secondary | ICD-10-CM | POA: Diagnosis not present

## 2020-04-18 DIAGNOSIS — E1122 Type 2 diabetes mellitus with diabetic chronic kidney disease: Secondary | ICD-10-CM

## 2020-04-18 DIAGNOSIS — N1831 Chronic kidney disease, stage 3a: Secondary | ICD-10-CM

## 2020-04-18 NOTE — Progress Notes (Signed)
Name: Kayla Brown  Age/ Sex: 69 y.o., female   MRN/ DOB: [id number], 1950-12-20     PCP: Sheliah Hatch, MD   Reason for Endocrinology Evaluation: Type 2 Diabetes Mellitus  Initial Endocrine Consultative Visit: 04/06/2019    PATIENT IDENTIFIER: Kayla Brown is a 69 y.o. female with a past medical history of HTN, CHF and T2DM . The patient has followed with Endocrinology clinic since 04/06/2019 for consultative assistance with management of her diabetes.  DIABETIC HISTORY:  Kayla Brown was diagnosed with T2DM in 2001. She was unable to start Jardiance due to cost . Actos had to be stopped due to CHF. Her hemoglobin A1c has ranged from 6.7% in 2017, peaking at 8.1% in 2020  On her initial visit to our clinic she had an A1c of 8.0%. She was on [employer] and Metformin. We switched Glipizide XL to regular Glipizide and continued metformin   Was started on farxiga through 03/2020  SUBJECTIVE:   During the last visit (08/31/2019): A1c of 7.2 %. We continued metformin and Glipizide    Today (04/18/2020): Kayla Brown is here for a follow up on diabetes management.  She checks her blood sugars 1 times daily. The patient has not had hypoglycemic episodes since the last clinic visit  She was started on Farxiga in 03/2020, but due to miscommunication, she stopped Glipizide and Metformin and noted hyperglycemia with BG's as high as 222 mg/dL. She stopped the [place] and is back on metformin and Glipizide.     HOME DIABETES REGIMEN:  Glipizide 5 mg, 1 tablet BID Metformin 500 mg 2 tablets with Breakfast and 2 tablets before Supper Farxiga 10 mg daily    GLUCOSE LOG:  N/A   DIABETIC COMPLICATIONS: Microvascular complications:   CKD   Denies: neuropathy , retinopathy   Last eye exam: Completed 06/2018  Macrovascular complications:   CHF  Denies: CAD, PVD, CVA  HISTORY:  Past Medical History:  Past Medical History:  Diagnosis Date  . Anxiety   . Diabetes  mellitus   . Edema of extremities 03/02/2016  . Essential hypertension, benign   . Lazy eye   . Major depressive disorder, single episode, unspecified   . Obesity (BMI 30-39.9) 03/01/2016  . OSA (obstructive sleep apnea)   . Pulmonary HTN (HCC)    mild with PASP by echo 05/2016  . Pure hypercholesterolemia   . Retinopathy, due to hypertension    Past Surgical History:  Past Surgical History:  Procedure Laterality Date  . CPAP TITRATION  10/14/2015  . DILATION AND CURETTAGE OF UTERUS  2005-2008   x2  . EYE SURGERY     cataracts  . WISDOM TOOTH EXTRACTION      Social History:  reports that she has never smoked. She has never used smokeless tobacco. She reports that she does not drink alcohol and does not use drugs. Family History:  Family History  Adopted: Yes  Problem Relation Age of Onset  . Alcohol abuse Mother   . ADD / ADHD Mother   . Dementia Mother   . Breast cancer Neg Hx      HOME MEDICATIONS: Allergies as of 04/18/2020   No Known Allergies     Medication List       Accurate as of April 18, 2020 11:59 PM. If you have any questions, ask your nurse or doctor.        albuterol 108 (90 Base) MCG/ACT inhaler Commonly known as: ProAir HFA Inhale 1-2 puffs  into the lungs every 6 (six) hours as needed for wheezing or shortness of breath.   Alcohol Prep Pads Pt uses an alcohol pad each time sugars are tested. Pt tests twice daily. Dx E11.9   amLODipine 10 MG tablet Commonly known as: NORVASC TAKE 1 TABLET BY MOUTH EVERY DAY   atorvastatin 40 MG tablet Commonly known as: LIPITOR TAKE 1 TABLET BY MOUTH EVERY DAY   carvedilol 25 MG tablet Commonly known as: COREG Take 1 tablet (25 mg total) by mouth 2 (two) times daily.   cyclobenzaprine 10 MG tablet Commonly known as: FLEXERIL TAKE 1 TABLET BY MOUTH EVERYDAY AT BEDTIME   dapagliflozin propanediol 10 MG Tabs tablet Commonly known as: FARXIGA Take 10 mg by mouth daily.   fish oil-omega-3 fatty  acids 1000 MG capsule Take 1 g by mouth daily.   fluticasone 50 MCG/ACT nasal spray Commonly known as: FLONASE SPRAY 2 SPRAYS INTO EACH NOSTRIL EVERY DAY What changed: See the new instructions.   furosemide 20 MG tablet Commonly known as: LASIX TAKE 1 TABLET BY MOUTH EVERY DAY   glipiZIDE 5 MG tablet Commonly known as: GLUCOTROL Take 1 tablet (5 mg total) by mouth 2 (two) times daily before a meal.   glucose blood test strip Commonly known as: FREESTYLE LITE USE TO TEST BLOOD GLUCOSE 2 TIMES DAILY. Dx E11.9   loratadine 10 MG tablet Commonly known as: CLARITIN TAKE 1 TABLET BY MOUTH EVERY DAY   melatonin 3 MG Tabs tablet Take 10 mg by mouth at bedtime.   meloxicam 15 MG tablet Commonly known as: MOBIC TAKE 1 TABLET BY MOUTH EVERY DAY   metFORMIN 500 MG 24 hr tablet Commonly known as: GLUCOPHAGE-XR Take 2 tablets (1,000 mg total) by mouth in the morning and at bedtime.   OVER THE COUNTER MEDICATION Take 1 tablet by mouth daily. Equate Vision Formula 50+   predniSONE 10 MG (21) Tbpk tablet Commonly known as: STERAPRED UNI-PAK 21 TAB Take as directed   sertraline 50 MG tablet Commonly known as: ZOLOFT TAKE 1 TABLET BY MOUTH EVERY DAY   traMADol 50 MG tablet Commonly known as: ULTRAM Take 1 tablet (50 mg total) by mouth every 6 (six) hours as needed.   traMADol 50 MG tablet Commonly known as: ULTRAM Take 1-2 tablets (50-100 mg total) by mouth daily as needed.   TRUEplus Lancets 30G Misc Use one lancet each time sugars are tested. Pt tests twice daily. Dx. E11.9   UNABLE TO FIND Med Name: Healthwise talking meter   Vision Formula Tabs Take 1 tablet by mouth daily.   VITAMIN D PO Take 2,000 Units by mouth daily.        OBJECTIVE:   Vital Signs: BP (!) 142/72   Pulse 86   Ht 5' (1.524 m)   Wt 180 lb (81.6 kg)   LMP 06/25/2008   SpO2 98%   BMI 35.15 kg/m   Wt Readings from Last 3 Encounters:  04/18/20 180 lb (81.6 kg)  03/04/20 181 lb 6.4 oz  (82.3 kg)  02/09/20 182 lb 6.4 oz (82.7 kg)     Exam: General: Pt appears well and is in NAD  Neck: General: Supple without adenopathy. Thyroid: Thyroid size normal.  No goiter or nodules appreciated. No thyroid bruit.  Lungs: Clear with good BS bilat with no rales, rhonchi, or wheezes  Heart: RRR with normal S1 and S2 and no gallops; no murmurs; no rub  Extremities: No pretibial edema.   Neuro: MS is  good with appropriate affect, pt is alert and Ox3   DM foot exam: 03/04/2020  The skin of the feet is without sores or ulcerations. The pedal pulses are 2+ on right and 2+ on left. The sensation is intact to a screening 5.07, 10 gram monofilament bilaterally    DATA REVIEWED:  Lab Results  Component Value Date   HGBA1C 7.2 (A) 03/04/2020   HGBA1C 7.3 (H) 11/19/2019   HGBA1C 6.7 (A) 08/28/2019   Lab Results  Component Value Date   MICROALBUR 4.7 (H) 05/25/2019   LDLCALC 61 05/25/2019   CREATININE 1.08 11/19/2019   Lab Results  Component Value Date   MICRALBCREAT 5.8 05/25/2019     Lab Results  Component Value Date   CHOL 135 11/19/2019   HDL 44.80 11/19/2019   LDLCALC 61 05/25/2019   LDLDIRECT 65.0 11/19/2019   TRIG 210.0 (H) 11/19/2019   CHOLHDL 3 11/19/2019        ASSESSMENT / PLAN / RECOMMENDATIONS:   1) Type 2 Diabetes Mellitus, Sub- Optimally controlled, With CKD III complications - Most recent A1c of 7.2 %. Goal A1c < 7.0%.    - She is here to mainly discuss a miscommunication issue with her glycemic agents. She was approved for PAP for Farxiga.  - I have explained to her this is to be added to Metformin and GlipiZide for now, but if she notices that her glucose readings are consistently less then a 100 mg/L , to notify me through my chart so I can reduce Glipizide.     MEDICATIONS: Continue Glipizide 5 mg, 1 tablet BID  Continue Metformin 500 mg 2 tablets with Breakfast and 2 tablets before Supper Restart Farxiga 10 mg, 1 tablet with breakfast     EDUCATION / INSTRUCTIONS:  BG monitoring instructions: Patient is instructed to check her blood sugars 2 times a day, fasting and supper.   Call [facility] if: BG persistently < 70  . I reviewed the Rule of 15 for the treatment of hypoglycemia in detail with the patient. Literature supplied.     F/U in 3 months    Signed electronically by: Lyndle Herrlich, MD  [facility] Endocrinology  [facility] [address] [place] [place], [place] [address] Phone: [phone] FAX: [phone]   CC: Sheliah Hatch, MD [address] [place] [address] Phone: [phone]  Fax: [phone]  Return to Endocrinology clinic as below: Future Appointments  Date Time Provider Department Center  05/23/2020  2:00 PM Sheliah Hatch, MD [facility]  05/27/2020  9:00 AM Hoyt Koch, Pryor Ochoa, RN [facility] None  06/02/2020  2:40 PM [place], [name], MD [facility]  06/03/2020 12:30 PM [facility] PHARMACIST [facility]  06/16/2020  2:30 PM Jetty Duhamel D, MD [facility] None  07/21/2020  9:30 AM Ronnel Zuercher, Konrad Dolores, MD [facility] None  09/02/2020 10:10 AM Lajuana Patchell, Konrad Dolores, MD [facility] None

## 2020-04-18 NOTE — Patient Instructions (Signed)
-    Continue Glipizide 5 mg, 1 tablet Before breakfast and 1 tablet before supper   - Continue Metformin 500 mg , 2 tablets with Breakfast and 2 tablets before Supper - Start Farxiga 10 mg, 1 tablet daily with Breakfast     LET ME KNOW IF YOUR SUGARS ARE LESS THEN 100    - HOW TO TREAT LOW BLOOD SUGARS (Blood sugar LESS THAN 70 MG/DL)  Please follow the RULE OF 15 for the treatment of hypoglycemia treatment (when your (blood sugars are less than 70 mg/dL)    STEP 1: Take 15 grams of carbohydrates when your blood sugar is low, which includes:   3-4 GLUCOSE TABS  OR  3-4 OZ OF JUICE OR REGULAR SODA OR  ONE TUBE OF GLUCOSE GEL     STEP 2: RECHECK blood sugar in 15 MINUTES STEP 3: If your blood sugar is still low at the 15 minute recheck --> then, go back to STEP 1 and treat AGAIN with another 15 grams of carbohydrates.

## 2020-04-19 DIAGNOSIS — M5416 Radiculopathy, lumbar region: Secondary | ICD-10-CM | POA: Diagnosis not present

## 2020-04-19 DIAGNOSIS — M6281 Muscle weakness (generalized): Secondary | ICD-10-CM | POA: Diagnosis not present

## 2020-04-19 DIAGNOSIS — E1165 Type 2 diabetes mellitus with hyperglycemia: Secondary | ICD-10-CM | POA: Insufficient documentation

## 2020-04-21 DIAGNOSIS — M6281 Muscle weakness (generalized): Secondary | ICD-10-CM | POA: Diagnosis not present

## 2020-04-21 DIAGNOSIS — M5416 Radiculopathy, lumbar region: Secondary | ICD-10-CM | POA: Diagnosis not present

## 2020-05-03 DIAGNOSIS — M5416 Radiculopathy, lumbar region: Secondary | ICD-10-CM | POA: Diagnosis not present

## 2020-05-03 DIAGNOSIS — M6281 Muscle weakness (generalized): Secondary | ICD-10-CM | POA: Diagnosis not present

## 2020-05-04 DIAGNOSIS — M6281 Muscle weakness (generalized): Secondary | ICD-10-CM | POA: Diagnosis not present

## 2020-05-04 DIAGNOSIS — M5416 Radiculopathy, lumbar region: Secondary | ICD-10-CM | POA: Diagnosis not present

## 2020-05-09 DIAGNOSIS — M6281 Muscle weakness (generalized): Secondary | ICD-10-CM | POA: Diagnosis not present

## 2020-05-09 DIAGNOSIS — M5416 Radiculopathy, lumbar region: Secondary | ICD-10-CM | POA: Diagnosis not present

## 2020-05-12 DIAGNOSIS — M5416 Radiculopathy, lumbar region: Secondary | ICD-10-CM | POA: Diagnosis not present

## 2020-05-12 DIAGNOSIS — M6281 Muscle weakness (generalized): Secondary | ICD-10-CM | POA: Diagnosis not present

## 2020-05-17 DIAGNOSIS — M5416 Radiculopathy, lumbar region: Secondary | ICD-10-CM | POA: Diagnosis not present

## 2020-05-17 DIAGNOSIS — M6281 Muscle weakness (generalized): Secondary | ICD-10-CM | POA: Diagnosis not present

## 2020-05-23 ENCOUNTER — Encounter: Payer: Self-pay | Admitting: Family Medicine

## 2020-05-23 ENCOUNTER — Other Ambulatory Visit: Payer: Self-pay

## 2020-05-23 ENCOUNTER — Ambulatory Visit (INDEPENDENT_AMBULATORY_CARE_PROVIDER_SITE_OTHER): Payer: Medicare Other | Admitting: Family Medicine

## 2020-05-23 VITALS — BP 125/60 | HR 56 | Temp 97.6°F | Resp 20 | Ht 61.0 in | Wt 178.6 lb

## 2020-05-23 DIAGNOSIS — N1832 Chronic kidney disease, stage 3b: Secondary | ICD-10-CM | POA: Diagnosis not present

## 2020-05-23 DIAGNOSIS — R141 Gas pain: Secondary | ICD-10-CM

## 2020-05-23 DIAGNOSIS — E669 Obesity, unspecified: Secondary | ICD-10-CM

## 2020-05-23 DIAGNOSIS — E785 Hyperlipidemia, unspecified: Secondary | ICD-10-CM

## 2020-05-23 DIAGNOSIS — E1122 Type 2 diabetes mellitus with diabetic chronic kidney disease: Secondary | ICD-10-CM | POA: Diagnosis not present

## 2020-05-23 DIAGNOSIS — I10 Essential (primary) hypertension: Secondary | ICD-10-CM | POA: Diagnosis not present

## 2020-05-23 NOTE — Progress Notes (Signed)
   Subjective:    Patient ID: Kayla Brown, female    DOB: 1951-05-04, 69 y.o.   MRN: [id number]  HPI HTN- chronic problem, on Norvasc 10mg  daily, Coreg 38m BID w/ good control.  No CP, SOB, HAs, visual changes, edema.  Hyperlipidemia- chronic problem, on Lipitor 40mg  daily.  No abd pain, N/V.  DM- chronic problem, now following w/ Endo.  UTD on eye exam, foot exam.  Due for microalbumin  Obesity- BMI is down to 33.75.  Having a hard time exercising due to ongoing hip pain.  Currently in PT  Gas- pt reports she will take Beano as needed.  She reports the gas is embarrassing.  She struggles w/ constipation which worsens the gas.  Pt is eating Fiber 1 bars.   Review of Systems For ROS see HPI   This visit occurred during the SARS-CoV-2 public health emergency.  Safety protocols were in place, including screening questions prior to the visit, additional usage of staff PPE, and extensive cleaning of exam room while observing appropriate contact time as indicated for disinfecting solutions.       Objective:   Physical Exam Vitals reviewed.  Constitutional:      General: She is not in acute distress.    Appearance: She is well-developed. She is obese.  HENT:     Head: Normocephalic and atraumatic.  Eyes:     Conjunctiva/sclera: Conjunctivae normal.     Pupils: Pupils are equal, round, and reactive to light.  Neck:     Thyroid: No thyromegaly.  Cardiovascular:     Rate and Rhythm: Normal rate and regular rhythm.     Heart sounds: Normal heart sounds. No murmur heard.   Pulmonary:     Effort: Pulmonary effort is normal. No respiratory distress.     Breath sounds: Normal breath sounds.  Abdominal:     General: There is no distension.     Palpations: Abdomen is soft.     Tenderness: There is no abdominal tenderness.  Musculoskeletal:     Cervical back: Normal range of motion and neck supple.     Right lower leg: No edema.     Left lower leg: No edema.  Lymphadenopathy:      Cervical: No cervical adenopathy.  Skin:    General: Skin is warm and dry.  Neurological:     Mental Status: She is alert and oriented to person, place, and time.  Psychiatric:        Behavior: Behavior normal.           Assessment & Plan:  Gas- ongoing issue for pt.  Taking Beano PRN is not working.  Discussed that as long as she is struggling w/ constipation she will likely have gas.  Will decrease Miralax to 1/2 cap daily so it is not as explosive when she takes a full cap intermittently.  Encouraged increased water, addition of stool softener, regular physical activity, and she should limit the Fiber 1 bars.  Pt expressed understanding and is in agreement w/ plan.

## 2020-05-23 NOTE — Patient Instructions (Signed)
Follow up in 6 months to recheck BP and cholesterol We'll notify you of your lab results and make any changes if needed Continue to work on healthy diet and regular exercise- you can do it! Add Colace (stool softener) to help w/ constipation Continue the Miralax but use half dose so it's not as explosive Improving the constipation should improve the gas but you can use Beano as needed Call with any questions or concerns Happy Holidays!

## 2020-05-24 ENCOUNTER — Other Ambulatory Visit: Payer: Self-pay

## 2020-05-24 DIAGNOSIS — M5416 Radiculopathy, lumbar region: Secondary | ICD-10-CM | POA: Diagnosis not present

## 2020-05-24 DIAGNOSIS — M6281 Muscle weakness (generalized): Secondary | ICD-10-CM | POA: Diagnosis not present

## 2020-05-24 DIAGNOSIS — N1832 Chronic kidney disease, stage 3b: Secondary | ICD-10-CM

## 2020-05-24 LAB — HEPATIC FUNCTION PANEL
ALT: 29 U/L (ref 0–35)
AST: 18 U/L (ref 0–37)
Albumin: 4.3 g/dL (ref 3.5–5.2)
Alkaline Phosphatase: 61 U/L (ref 39–117)
Bilirubin, Direct: 0.1 mg/dL (ref 0.0–0.3)
Total Bilirubin: 0.3 mg/dL (ref 0.2–1.2)
Total Protein: 6.6 g/dL (ref 6.0–8.3)

## 2020-05-24 LAB — CBC WITH DIFFERENTIAL/PLATELET
Basophils Absolute: 0.1 10*3/uL (ref 0.0–0.1)
Basophils Relative: 0.9 % (ref 0.0–3.0)
Eosinophils Absolute: 0.9 10*3/uL — ABNORMAL HIGH (ref 0.0–0.7)
Eosinophils Relative: 8.8 % — ABNORMAL HIGH (ref 0.0–5.0)
HCT: 39.4 % (ref 36.0–46.0)
Hemoglobin: 13 g/dL (ref 12.0–15.0)
Lymphocytes Relative: 26.1 % (ref 12.0–46.0)
Lymphs Abs: 2.6 10*3/uL (ref 0.7–4.0)
MCHC: 32.9 g/dL (ref 30.0–36.0)
MCV: 91.6 fl (ref 78.0–100.0)
Monocytes Absolute: 1.1 10*3/uL — ABNORMAL HIGH (ref 0.1–1.0)
Monocytes Relative: 10.7 % (ref 3.0–12.0)
Neutro Abs: 5.4 10*3/uL (ref 1.4–7.7)
Neutrophils Relative %: 53.5 % (ref 43.0–77.0)
Platelets: 287 10*3/uL (ref 150.0–400.0)
RBC: 4.3 Mil/uL (ref 3.87–5.11)
RDW: 14.5 % (ref 11.5–15.5)
WBC: 10 10*3/uL (ref 4.0–10.5)

## 2020-05-24 LAB — LDL CHOLESTEROL, DIRECT: Direct LDL: 50 mg/dL

## 2020-05-24 LAB — LIPID PANEL
Cholesterol: 115 mg/dL (ref 0–200)
HDL: 45.7 mg/dL (ref >39.00)
NonHDL: 68.88
Total CHOL/HDL Ratio: 3
Triglycerides: 232 mg/dL — ABNORMAL HIGH (ref 0.0–149.0)
VLDL: 46.4 mg/dL — ABNORMAL HIGH (ref 0.0–40.0)

## 2020-05-24 LAB — MICROALBUMIN / CREATININE URINE RATIO
Creatinine,U: 19.9 mg/dL
Microalb Creat Ratio: 3.5 mg/g (ref 0.0–30.0)
Microalb, Ur: <0.7 mg/dL (ref 0.0–1.9)

## 2020-05-24 LAB — BASIC METABOLIC PANEL
BUN: 26 mg/dL — ABNORMAL HIGH (ref 6–23)
CO2: 27 mEq/L (ref 19–32)
Calcium: 9.7 mg/dL (ref 8.4–10.5)
Chloride: 102 mEq/L (ref 96–112)
Creatinine, Ser: 1.24 mg/dL — ABNORMAL HIGH (ref 0.40–1.20)
GFR: 44.83 mL/min — ABNORMAL LOW (ref >60.00)
Glucose, Bld: 188 mg/dL — ABNORMAL HIGH (ref 70–99)
Potassium: 4.5 mEq/L (ref 3.5–5.1)
Sodium: 139 mEq/L (ref 135–145)

## 2020-05-24 LAB — TSH: TSH: 1.32 u[IU]/mL (ref 0.35–4.50)

## 2020-05-24 NOTE — Assessment & Plan Note (Signed)
Ongoing issue for pt.  BMI is down to 33.75 which means she no longer qualifies as morbidly obese.  Encouraged her to continue healthy diet and regular exercise.  Will follow.

## 2020-05-24 NOTE — Assessment & Plan Note (Signed)
Chronic problem.  Well controlled on Norvasc 10mg  daily, Coreg 25mg  BID.  Currently asymptomatic.  Will continue to follow.

## 2020-05-24 NOTE — Assessment & Plan Note (Signed)
Chronic problem.  On Lipitor 40mg daily w/o difficulty.  Check labs.  Adjust meds prn  ?

## 2020-05-24 NOTE — Assessment & Plan Note (Signed)
Chronic problem.  Following w/ Endo but has not had a microalbumin.  Will get today.

## 2020-05-25 DIAGNOSIS — Z23 Encounter for immunization: Secondary | ICD-10-CM | POA: Diagnosis not present

## 2020-05-26 DIAGNOSIS — M5416 Radiculopathy, lumbar region: Secondary | ICD-10-CM | POA: Diagnosis not present

## 2020-05-26 DIAGNOSIS — M6281 Muscle weakness (generalized): Secondary | ICD-10-CM | POA: Diagnosis not present

## 2020-05-27 ENCOUNTER — Other Ambulatory Visit: Payer: Self-pay | Admitting: *Deleted

## 2020-05-27 ENCOUNTER — Other Ambulatory Visit: Payer: Self-pay | Admitting: Family Medicine

## 2020-05-27 NOTE — Patient Instructions (Signed)
Goals Addressed            This Visit's Progress    Eat Healthy       Follow Up Date 08/22/20    - set goal weight - manage portion size - prepare main meal at home 3 to 5 days each week - read food labels for fat, fiber, carbohydrates and portion size - reduce red meat to 2 to 3 times a week - set a realistic goal    Why is this important?   When you are ready to manage your nutrition or weight, having a plan and setting goals will help.  Taking small steps to change how you eat and exercise is a good place to start.    Notes: Patient states she is increasing the amount of fruits an vegetables she consumes, she is decreasing the amount of snacking she does and controlling the portions, and she has increased the amount of water she drinks. Updated: 05/27/20     COMPLETED: Make and Keep All Appointments       Follow Up Date 05/27/20   - call to cancel if needed - keep a calendar with appointment dates    Why is this important?   Part of staying healthy is seeing the doctor for follow-up care.  If you forget your appointments, there are some things you can do to stay on track.    Notes: Patient states she does not have any difficulty managing her provider appointments. Updated:05/27/20     COMPLETED: Manage My Cholesterol       Follow Up Date 05/27/20    - eat smaller or less servings of red meat - increase the amount of fiber in food - read food labels for fat and fiber - switch to low-fat or skim milk    Why is this important?   Changing cholesterol starts with eating heart-healthy foods.  Other steps may be to increase your activity and to quit if you smoke.    Notes: Patient states that she is eating a heart healthy low cholesterol diet.  Updated: 05/27/20     Patient will not go to the ED or Hospital due to CHF exacerbation within the next 90 days   On track    CARE PLAN ENTRY (see longtitudinal plan of care for additional care plan information)   Current  Barriers:   Knowledge deficit related to basic heart failure pathophysiology and self care management  Case Manager Clinical Goal(s):   Over the next 90 days, patient will verbalize understanding of Heart Failure Action Plan and when to call doctor  Patient states she does have the written [facility] CHF Zones and actions plans  Over the next 90 days patient will verbalize adhering to low sodium diet  Over the next 90 days patient will verbalize increasing her physical activity as  tolerated   Interventions:   Provided verbal education on low sodium diet  Reviewed Heart Failure Action Plan in depth and provided written copy  Discussed importance of daily weight and advised patient to weigh and record daily  Reviewed role of diuretics in prevention of fluid overload and management of heart failure  Patient Self Care Activities:   Takes Heart Failure Medications as prescribed  Weighs daily and record (notifying MD of 3 lb weight gain over night or 5 lb in a week)  Verbalizes understanding of and follows CHF Action Plan  Patient   does have an action plan with her cardiologist to take additional  lasix with weight gain, fluid restrict to 1400 cc daily, and to keep her sodium intake to 2000 mg daily. She also follows the [facility] CHF zones and action plans.  Patient continues to go to PT to help decrease her back pain and increase her physical mobility.   Please see past updates related to this goal by clicking on the "Past Updates" button in the selected goal  Updated: 05/27/20       COMPLETED: Track and Manage Fluids and Swelling       Follow Up Date 05/27/20   - call office if I gain more than 2 pounds in one day or 5 pounds in one week - keep legs up while sitting - track weight in diary - use salt in moderation - watch for swelling in feet, ankles and legs every day - weigh myself daily    Why is this important?   It is important to check your weight daily and watch how much  salt and liquids you have.  It will help you to manage your heart failure.    Notes: Patient weighs and records daily, she tries to limit her sodium intake to 2000 mg daily, and does call her provider with weight gain or worsening CHF symptoms Updated: 05/27/20     COMPLETED: Track and Manage My Blood Pressure       Follow Up Date 05/27/20   - check blood pressure daily - write blood pressure results in a log or diary    Why is this important?   You won't feel high blood pressure, but it can still hurt your blood vessels.  High blood pressure can cause heart or kidney problems. It can also cause a stroke.  Making lifestyle changes like losing a little weight or eating less salt will help.  Checking your blood pressure at home and at different times of the day can help to control blood pressure.  If the doctor prescribes medicine remember to take it the way the doctor ordered.  Call the office if you cannot afford the medicine or if there are questions about it.     Notes: Patient takes her B/P daily and records the value.      Track and Manage Symptoms       Follow Up Date 08/22/20   - begin a heart failure diary - bring diary to all appointments - develop a rescue plan - eat more whole grains, fruits and vegetables, lean meats and healthy fats - follow rescue plan if symptoms flare-up - know when to call the doctor - track symptoms and what helps feel better or worse    Why is this important?   You will be able to handle your symptoms better if you keep track of them.  Making some simple changes to your lifestyle will help.  Eating healthy is one thing you can do to take good care of yourself.    Notes: Patient has developed an action plan with her cardiologist, she follows the [facility] CHF zones and action plans. Patient weighs herself and records the values daily.  Updated:05/27/20

## 2020-05-27 NOTE — Patient Outreach (Signed)
[facility]) Care Management  [facility] Care Manager  05/27/2020   Kayla Brown [birth year] [id number]  Subjective: Successful telephone outreach call to patient. HIPAA identifiers obtained. Patient reports that she is experiencing a lot of back pain. Patient explains that she continues to go to PT to help with the pain and to increase her physical mobility but recently her back pain has worsen and she is not able to participate like she was previously. Patient states she does have an appointment with her back surgeon on 06/01/20 to discuss her situation. Patient reports that her CHF is controlled presently. Her weight has been stable and she continues to follow Dr. Elvis Coil instructions of limiting sodium to 2000 mg and a fluid to 1400 cc daily. Patient added that she has not had to take an additional lasix tablet lately and has an appointment to see Dr. [place] 06/02/20. Patients states she has not had any recent falls and denies any needs for DME. Patient did not have any further questions or concerns. She did confirm that she has this nurse's contact number and she will call her if needed.   Encounter Medications:  Outpatient Encounter Medications as of 05/27/2020  Medication Sig  . Alcohol Swabs (ALCOHOL PREP) PADS Pt uses an alcohol pad each time sugars are tested. Pt tests twice daily. Dx E11.9  . amLODipine (NORVASC) 10 MG tablet TAKE 1 TABLET BY MOUTH EVERY DAY  . atorvastatin (LIPITOR) 40 MG tablet TAKE 1 TABLET BY MOUTH EVERY DAY  . carvedilol (COREG) 25 MG tablet Take 1 tablet (25 mg total) by mouth 2 (two) times daily.  . Cholecalciferol (VITAMIN D PO) Take 2,000 Units by mouth daily.   . dapagliflozin propanediol (FARXIGA) 10 MG TABS tablet Take 10 mg by mouth daily.  . fish oil-omega-3 fatty acids 1000 MG capsule Take 1 g by mouth daily.  . fluticasone (FLONASE) 50 MCG/ACT nasal spray SPRAY 2 SPRAYS INTO EACH NOSTRIL EVERY DAY (Patient taking differently: Place 2 sprays  into both nostrils daily. )  . furosemide (LASIX) 20 MG tablet TAKE 1 TABLET BY MOUTH EVERY DAY  . glipiZIDE (GLUCOTROL) 5 MG tablet Take 1 tablet (5 mg total) by mouth 2 (two) times daily before a meal.  . glucose blood (FREESTYLE LITE) test strip USE TO TEST BLOOD GLUCOSE 2 TIMES DAILY. Dx E11.9  . loratadine (CLARITIN) 10 MG tablet TAKE 1 TABLET BY MOUTH EVERY DAY (Patient taking differently: Take 10 mg by mouth daily. )  . Melatonin 3 MG TABS Take 10 mg by mouth at bedtime.   . metFORMIN (GLUCOPHAGE-XR) 500 MG 24 hr tablet Take 2 tablets (1,000 mg total) by mouth in the morning and at bedtime.  . Multiple Vitamins-Minerals (VISION FORMULA) TABS Take 1 tablet by mouth daily.   [name] OVER THE COUNTER MEDICATION Take 1 tablet by mouth daily. Equate Vision Formula 50+   . sertraline (ZOLOFT) 50 MG tablet TAKE 1 TABLET BY MOUTH EVERY DAY  . traMADol (ULTRAM) 50 MG tablet Take 1 tablet (50 mg total) by mouth every 6 (six) hours as needed.  . traMADol (ULTRAM) 50 MG tablet Take 1-2 tablets (50-100 mg total) by mouth daily as needed.  . TRUEPLUS LANCETS 30G MISC Use one lancet each time sugars are tested. Pt tests twice daily. Dx. E11.9  . UNABLE TO FIND Med Name: Healthwise talking meter  . [DISCONTINUED] furosemide (LASIX) 20 MG tablet TAKE 1 TABLET BY MOUTH EVERY DAY   No facility-administered encounter medications on file  as of 05/27/2020.    Functional Status:  In your present state of health, do you have any difficulty performing the following activities: 05/23/2020 01/19/2020  Hearing? N Y  Comment - has hearing aids  Vision? N N  Difficulty concentrating or making decisions? N N  Walking or climbing stairs? N Y  Comment - patient uses a walker with long distances and does have difficulty climbing stairs  Dressing or bathing? N N  Doing errands, shopping? N N  Preparing Food and eating ? - N  Using the Toilet? - N  In the past six months, have you accidently leaked urine? - N  Do you  have problems with loss of bowel control? - N  Managing your Medications? - N  Managing your Finances? - N  Housekeeping or managing your Housekeeping? - N  Some recent data might be hidden    Fall/Depression Screening: Fall Risk  05/27/2020 05/27/2020 05/23/2020  Falls in the past year? 0 0 0  Number falls in past yr: 0 0 0  Comment - - -  Injury with Fall? 0 0 0  Risk for fall due to : - No Fall Risks No Fall Risks  Follow up Falls evaluation completed;Education provided;Falls prevention discussed Falls prevention discussed;Education provided;Falls evaluation completed -   PHQ 2/9 Scores 05/23/2020 04/01/2020 01/19/2020 11/19/2019 05/25/2019 03/20/2019 03/17/2019  PHQ - 2 Score 0 0 0 0 0 0 0  PHQ- 9 Score 0 - - 0 0 0 -    Assessment:  Goals Addressed            This Visit's Progress   . Eat Healthy       Follow Up Date 08/22/20    - set goal weight - manage portion size - prepare main meal at home 3 to 5 days each week - read food labels for fat, fiber, carbohydrates and portion size - reduce red meat to 2 to 3 times a week - set a realistic goal    Why is this important?   When you are ready to manage your nutrition or weight, having a plan and setting goals will help.  Taking small steps to change how you eat and exercise is a good place to start.    Notes: Patient states she is increasing the amount of fruits an vegetables she consumes, she is decreasing the amount of snacking she does and controlling the portions, and she has increased the amount of water she drinks. Updated: 05/27/20    . COMPLETED: Make and Keep All Appointments       Follow Up Date 05/27/20   - call to cancel if needed - keep a calendar with appointment dates    Why is this important?   Part of staying healthy is seeing the doctor for follow-up care.  If you forget your appointments, there are some things you can do to stay on track.    Notes: Patient states she does not have any difficulty  managing her provider appointments. Updated:05/27/20    . COMPLETED: Manage My Cholesterol       Follow Up Date 05/27/20    - eat smaller or less servings of red meat - increase the amount of fiber in food - read food labels for fat and fiber - switch to low-fat or skim milk    Why is this important?   Changing cholesterol starts with eating heart-healthy foods.  Other steps may be to increase your activity and to quit if  you smoke.    Notes: Patient states that she is eating a heart healthy low cholesterol diet.  Updated: 05/27/20    . Patient will not go to the ED or Hospital due to CHF exacerbation within the next 90 days   On track    CARE PLAN ENTRY (see longtitudinal plan of care for additional care plan information)   Current Barriers:  [name] Knowledge deficit related to basic heart failure pathophysiology and self care management  Case Manager Clinical Goal(s):  [name] Over the next 90 days, patient will verbalize understanding of Heart Failure Action Plan and when to call doctor . Patient states she does have the written [facility] CHF Zones and actions plans . Over the next 90 days patient will verbalize adhering to low sodium diet . Over the next 90 days patient will verbalize increasing her physical activity as  tolerated   Interventions:  . Provided verbal education on low sodium diet . Reviewed Heart Failure Action Plan in depth and provided written copy . Discussed importance of daily weight and advised patient to weigh and record daily . Reviewed role of diuretics in prevention of fluid overload and management of heart failure  Patient Self Care Activities:  . Takes Heart Failure Medications as prescribed . Weighs daily and record (notifying MD of 3 lb weight gain over night or 5 lb in a week) . Verbalizes understanding of and follows CHF Action Plan . Patient   does have an action plan with her cardiologist to take additional lasix with weight gain, fluid restrict to 1400 cc  daily, and to keep her sodium intake to 2000 mg daily. She also follows the [facility] CHF zones and action plans. . Patient continues to go to PT to help decrease her back pain and increase her physical mobility.   Please see past updates related to this goal by clicking on the "Past Updates" button in the selected goal  Updated: 05/27/20      . COMPLETED: Track and Manage Fluids and Swelling       Follow Up Date 05/27/20   - call office if I gain more than 2 pounds in one day or 5 pounds in one week - keep legs up while sitting - track weight in diary - use salt in moderation - watch for swelling in feet, ankles and legs every day - weigh myself daily    Why is this important?   It is important to check your weight daily and watch how much salt and liquids you have.  It will help you to manage your heart failure.    Notes: Patient weighs and records daily, she tries to limit her sodium intake to 2000 mg daily, and does call her provider with weight gain or worsening CHF symptoms Updated: 05/27/20    . COMPLETED: Track and Manage My Blood Pressure       Follow Up Date 05/27/20   - check blood pressure daily - write blood pressure results in a log or diary    Why is this important?   You won't feel high blood pressure, but it can still hurt your blood vessels.  High blood pressure can cause heart or kidney problems. It can also cause a stroke.  Making lifestyle changes like losing a little weight or eating less salt will help.  Checking your blood pressure at home and at different times of the day can help to control blood pressure.  If the doctor prescribes medicine remember to take  it the way the doctor ordered.  Call the office if you cannot afford the medicine or if there are questions about it.     Notes: Patient takes her B/P daily and records the value.     . Track and Manage Symptoms       Follow Up Date 08/22/20   - begin a heart failure diary - bring diary to all  appointments - develop a rescue plan - eat more whole grains, fruits and vegetables, lean meats and healthy fats - follow rescue plan if symptoms flare-up - know when to call the doctor - track symptoms and what helps feel better or worse    Why is this important?   You will be able to handle your symptoms better if you keep track of them.  Making some simple changes to your lifestyle will help.  Eating healthy is one thing you can do to take good care of yourself.    Notes: Patient has developed an action plan with her cardiologist, [facility]. Patient weighs herself and records the values daily.  Updated:05/27/20      Plan: RN Health Coach will send PCP today's assessment note, will call patient within the month of February, and patient agrees to future outreach calls.   Blanchie ServeJill Brittian Renaldo RN, BSN [facility] Care [facility]  RN Health Coach [phone] [email or web address] [email or web address]

## 2020-05-28 NOTE — Progress Notes (Signed)
Cardiology Office Note   Date:  06/02/2020   ID:  Kayla Brown, DOB June 19, 1951, MRN [id number]  PCP:  [name], MD  Cardiologist:   Redford Behrle [name], MD   No chief complaint on file.     History of Present Illness: Kayla Brown is a 69 y.o. female who is seen for follow up dyspnea. She has a history of OSA.  She has a history of mild pulmonary HTN based on Echo in 2016 and 5456 but not replicated on Echo in 2563 and 2020. Myoview in August 2020 was normal. She has HTNand dyslipidemia. Prior pulmonary work up includes PFT 04/04/15- mild Diffusion defect, normal flows and volumes without response to bronchodilator. CT chest March 05, 2019 extensive atelectatic changes throughout the lungs with a dependent predominance, mosaic attenuation throughout the lungs, emphysema. VQ scan 03/04/2019 no unmatched segmental perfusion defects to suggest acute PE. Autoimmune work-up negative 2020. She had a 6-minute walk test that showed 267 m completed.  With no desaturations. Repeat PFTs show no airflow obstruction or restriction.  FEV1 102%, ratio 86, FVC 90% normal mid flows,, DLCO 90%.  She was admitted 9/8-9/21/20 with acute hypoxic respiratory failure. Patient developed  increased oxygen requirement on 9/10, at 8 L nasal cannula, PCCM consulted, work-up significant for atelectasis and ? pulmonary edema. BNP level was normal.  Patient improved with IV diuresis, and pulmonary PT, but unfortunately developed acute kidney injury due to diuresis. ACEi, hygroton, spironolactone  were discontinued. CT, Echo,  and CXR findings as noted below. She diuresed 20 lbs in the hospital.    On follow up today she is doing well.  Lives at [facility]. She is on steroids now for her spine. Planning to have epidural injection.  She is worried about her kidneys and is going to see nephrology.  Is compliant with CPAP therapy. Has not had to take extra lasix. Weight is stable. No edema.    Past Medical  History:  Diagnosis Date  . Anxiety   . Diabetes mellitus   . Edema of extremities 03/02/2016  . Essential hypertension, benign   . Lazy eye   . Major depressive disorder, single episode, unspecified   . Obesity (BMI 30-39.9) 03/01/2016  . OSA (obstructive sleep apnea)   . Pulmonary HTN ([place])    mild with PASP 77mHg by echo 05/2016  . Pure hypercholesterolemia   . Retinopathy, due to hypertension     Past Surgical History:  Procedure Laterality Date  . CPAP TITRATION  10/14/2015  . DILATION AND CURETTAGE OF UTERUS  2005-2008   x2  . EYE SURGERY     cataracts  . WISDOM TOOTH EXTRACTION       Current Outpatient Medications  Medication Sig Dispense Refill  . Alcohol Swabs (ALCOHOL PREP) PADS Pt uses an alcohol pad each time sugars are tested. Pt tests twice daily. Dx E11.9 100 each 3  . amLODipine (NORVASC) 10 MG tablet TAKE 1 TABLET BY MOUTH EVERY DAY 90 tablet 1  . atorvastatin (LIPITOR) 40 MG tablet TAKE 1 TABLET BY MOUTH EVERY DAY 90 tablet 1  . carvedilol (COREG) 25 MG tablet Take 1 tablet (25 mg total) by mouth 2 (two) times daily. 180 tablet 3  . Cholecalciferol (VITAMIN D PO) Take 2,000 Units by mouth daily.     . dapagliflozin propanediol (FARXIGA) 10 MG TABS tablet Take 10 mg by mouth daily.    . fish oil-omega-3 fatty acids 1000 MG capsule Take 1 g by mouth daily.    .[name]  fluticasone (FLONASE) 50 MCG/ACT nasal spray SPRAY 2 SPRAYS INTO EACH NOSTRIL EVERY DAY (Patient taking differently: Place 2 sprays into both nostrils daily.) 18 g 1  . furosemide (LASIX) 20 MG tablet TAKE 1 TABLET BY MOUTH EVERY DAY 90 tablet 1  . gabapentin (NEURONTIN) 100 MG capsule Take 100 mg by mouth 3 (three) times daily.    [name] glipiZIDE (GLUCOTROL) 5 MG tablet Take 1 tablet (5 mg total) by mouth 2 (two) times daily before a meal. 180 tablet 3  . glucose blood (FREESTYLE LITE) test strip USE TO TEST BLOOD GLUCOSE 2 TIMES DAILY. Dx E11.9 100 each 12  . loratadine (CLARITIN) 10 MG tablet TAKE 1 TABLET  BY MOUTH EVERY DAY (Patient taking differently: Take 10 mg by mouth daily.) 30 tablet 11  . Melatonin 3 MG TABS Take 10 mg by mouth at bedtime.     . metFORMIN (GLUCOPHAGE-XR) 500 MG 24 hr tablet Take 2 tablets (1,000 mg total) by mouth in the morning and at bedtime. 360 tablet 3  . methocarbamol (ROBAXIN) 500 MG tablet Take 500 mg by mouth 2 (two) times daily.    . methylPREDNISolone (MEDROL DOSEPAK) 4 MG TBPK tablet Take by mouth.    . Multiple Vitamins-Minerals (VISION FORMULA) TABS Take 1 tablet by mouth daily.    [name] OVER THE COUNTER MEDICATION Take 1 tablet by mouth daily. Equate Vision Formula 50+    . sertraline (ZOLOFT) 50 MG tablet TAKE 1 TABLET BY MOUTH EVERY DAY 90 tablet 2  . traMADol (ULTRAM) 50 MG tablet Take 1 tablet (50 mg total) by mouth every 6 (six) hours as needed. 30 tablet 0  . traMADol (ULTRAM) 50 MG tablet Take 1-2 tablets (50-100 mg total) by mouth daily as needed. 20 tablet 0  . TRUEPLUS LANCETS 30G MISC Use one lancet each time sugars are tested. Pt tests twice daily. Dx. E11.9 100 each 3  . UNABLE TO FIND Med Name: Healthwise talking meter 1 kit 0   No current facility-administered medications for this visit.    Allergies:   Patient has no known allergies.    Social History:  The patient  reports that she has never smoked. She has never used smokeless tobacco. She reports that she does not drink alcohol and does not use drugs.   Family History:  The patient's family history includes ADD / ADHD in her mother; Alcohol abuse in her mother; Dementia in her mother. She was adopted.    ROS:  Please see the history of present illness.   Otherwise, review of systems are positive for none.   All other systems are reviewed and negative.    PHYSICAL EXAM: VS:  BP (!) 137/57   Pulse (!) 55   Ht 5' (1.524 m)   Wt 176 lb (79.8 kg)   LMP 06/25/2008   SpO2 99%   BMI 34.37 kg/m  , BMI Body mass index is 34.37 kg/m. GEN: Well nourished, obese, in no acute distress   HEENT: normal  Neck: no JVD, carotid bruits, or masses Cardiac: RRR; no murmurs, rubs, or gallops,no edema  Respiratory:  clear to auscultation bilaterally, normal work of breathing GI: soft, nontender, nondistended, + BS MS: no deformity or atrophy  Skin: warm and dry, no rash Neuro:  Strength and sensation are intact Psych: euthymic mood, full affect   EKG:  EKG is not ordered today. The ekg ordered today demonstrates N/A   Recent Labs: 05/23/2020: ALT 29; BUN 26; Creatinine, Ser 1.24; Hemoglobin  13.0; Platelets 287.0; Potassium 4.5; Sodium 139; TSH 1.32    Lipid Panel    Component Value Date/Time   CHOL 115 05/23/2020 1431   TRIG 232.0 (H) 05/23/2020 1431   HDL 45.70 05/23/2020 1431   CHOLHDL 3 05/23/2020 1431   VLDL 46.4 (H) 05/23/2020 1431   LDLCALC 61 05/25/2019 1352   LDLDIRECT 50.0 05/23/2020 1431      Wt Readings from Last 3 Encounters:  06/02/20 176 lb (79.8 kg)  05/23/20 178 lb 9.6 oz (81 kg)  04/18/20 180 lb (81.6 kg)      Other studies Reviewed: Additional studies/ records that were reviewed today include:  Myoview 02/02/19: Study Highlights    Nuclear stress EF: 54%.  There was no ST segment deviation noted during stress.  This is a low risk study.  The left ventricular ejection fraction is normal (55-65%).  The study is normal.   Low risk stress nuclear study with normal perfusion and normal left ventricular regional and global systolic function.      Echo 03/06/19: IMPRESSIONS    1. The left ventricle has hyperdynamic systolic function, with an ejection fraction of >65%. The cavity size was normal. There is moderate concentric left ventricular hypertrophy. Left ventricular diastolic Doppler parameters are consistent with  impaired relaxation. Elevated mean left atrial pressure No evidence of left ventricular regional wall motion abnormalities.  2. The right ventricle has normal systolic function. The cavity was normal. There is no  increase in right ventricular wall thickness. Right ventricular systolic pressure could not be assessed.  3. Left atrial size was mildly dilated.  4. Trivial pericardial effusion is present.  5. The aorta is normal unless otherwise noted.  PORTABLE CHEST 1 VIEW  COMPARISON:  Chest radiograph dated 03/05/2019  FINDINGS: Chronic interstitial coarsening and mild bronchitic changes. No focal consolidation, pleural effusion, or pneumothorax. Linear density in the left mid lung field, likely atelectatic changes. The cardiac silhouette is within normal limits. No acute osseous pathology.  IMPRESSION: No active disease.   Electronically Signed   By: Anner Crete M.D.   On: 03/11/2019 10:21  CT CHEST WITHOUT CONTRAST  TECHNIQUE: Multidetector CT imaging of the chest was performed following the standard protocol without IV contrast.  COMPARISON:  CT 04/05/2015  FINDINGS: Cardiovascular: Normal heart size. No pericardial effusion. Atherosclerotic calcification of the aortic leaflets is noted. There is mitral annular calcification is well. The aorta is normal caliber with atheromatous plaque. Additional calcifications are present in the proximal great vessels and upper abdominal aorta. Major venous structures are unremarkable.  Mediastinum/Nodes: Numerous though nonenlarged mediastinal nodes are present. Evaluation for hilar adenopathy is limited in the absence of contrast. No axillary adenopathy. Thyroid gland and thoracic inlet are unremarkable. Bowing of the posterior trachea 0 likely related to imaging during exhalation. There is mild airways thickening. The esophagus is unremarkable.  Lungs/Pleura: Redemonstration of mosaic attenuation throughout the lungs superimposed on centrilobular and paraseptal emphysema. There is increasing bandlike areas of subsegmental atelectasis and/or scarring as well as additional atelectatic change posteriorly in the lung  bases. No pneumothorax. No effusion. Stable subpleural area of scarring is seen in the right middle lobe.  Upper Abdomen: Small accessory splenule. Mild bilateral symmetric perinephric stranding, a nonspecific finding though may correlate with either age or decreased renal function. Portion of the hepatic flexure is interposed anterior to the liver. Fatty replacement of the pancreas. No pancreatic ductal dilatation or surrounding inflammatory changes. No acute abnormalities present in the visualized portions of the  upper abdomen.  Musculoskeletal: Multilevel degenerative changes are present in the imaged portions of the spine. No acute osseous abnormality or suspicious osseous lesion.  IMPRESSION: 1. Extensive atelectatic changes are present throughout the lungs with a dependent predominance. 2. Redemonstration of mosaic attenuation throughout the lungs superimposed on centrilobular and paraseptal emphysema, suggestive of small airways disease. 3. Mild bilateral symmetric perinephric stranding, a nonspecific finding though may correlate with either age or decreased renal function. 4. Aortic Atherosclerosis (ICD10-I70.0) and Emphysema (ICD10-J43.9).   Electronically Signed   By: Lovena Le M.D.   On: 03/05/2019 22:41  ASSESSMENT AND PLAN:  1.  Chronic diastolic CHF. Admitted in September 2020 with acute respiratory failure. Given clinical response this appears to be predominantly acute on chronic diastolic CHF.  BNP low but may be masked by obesity. Echo with moderate LVH but normal systolic function and no evidence of pulmonary HTN. She is clinically doing well.  Continue sodium restriction. Continue current lasix dose. Fluid restriction 1500-2000 cc/day.  2.  Hypertension -BP is well controlled now. Continue Coreg and amlodipine.  3.  Morbid Obesity  -I have encouraged weight loss.  4.  OSA on CPAP. followed by pulmonary now.   5. CKD stage 3.   Current  medicines are reviewed at length with the patient today.  The patient does not have concerns regarding medicines.  The following changes have been made:  See above.  Labs/ tests ordered today include:  No orders of the defined types were placed in this encounter.    Disposition:   FU with me in 6  months  Signed, Kayla Mergenthaler [name], MD  06/02/2020 2:40 PM    [facility] [address], [place], [place], [address] Phone [phone], Fax [phone]

## 2020-05-29 ENCOUNTER — Other Ambulatory Visit: Payer: Self-pay | Admitting: Family Medicine

## 2020-05-30 DIAGNOSIS — M4316 Spondylolisthesis, lumbar region: Secondary | ICD-10-CM | POA: Diagnosis not present

## 2020-05-30 DIAGNOSIS — M5416 Radiculopathy, lumbar region: Secondary | ICD-10-CM | POA: Diagnosis not present

## 2020-05-30 DIAGNOSIS — Z6834 Body mass index (BMI) 34.0-34.9, adult: Secondary | ICD-10-CM | POA: Diagnosis not present

## 2020-05-30 DIAGNOSIS — I10 Essential (primary) hypertension: Secondary | ICD-10-CM | POA: Diagnosis not present

## 2020-05-31 DIAGNOSIS — M6281 Muscle weakness (generalized): Secondary | ICD-10-CM | POA: Diagnosis not present

## 2020-05-31 DIAGNOSIS — M5416 Radiculopathy, lumbar region: Secondary | ICD-10-CM | POA: Diagnosis not present

## 2020-06-02 ENCOUNTER — Other Ambulatory Visit: Payer: Self-pay

## 2020-06-02 ENCOUNTER — Ambulatory Visit (INDEPENDENT_AMBULATORY_CARE_PROVIDER_SITE_OTHER): Payer: Medicare Other | Admitting: Cardiology

## 2020-06-02 ENCOUNTER — Encounter: Payer: Self-pay | Admitting: Cardiology

## 2020-06-02 VITALS — BP 137/57 | HR 55 | Ht 60.0 in | Wt 176.0 lb

## 2020-06-02 DIAGNOSIS — I5032 Chronic diastolic (congestive) heart failure: Secondary | ICD-10-CM

## 2020-06-02 DIAGNOSIS — G4733 Obstructive sleep apnea (adult) (pediatric): Secondary | ICD-10-CM | POA: Diagnosis not present

## 2020-06-02 DIAGNOSIS — I10 Essential (primary) hypertension: Secondary | ICD-10-CM

## 2020-06-03 ENCOUNTER — Ambulatory Visit: Payer: Medicare Other

## 2020-06-03 DIAGNOSIS — I5032 Chronic diastolic (congestive) heart failure: Secondary | ICD-10-CM | POA: Diagnosis not present

## 2020-06-03 DIAGNOSIS — N1832 Chronic kidney disease, stage 3b: Secondary | ICD-10-CM | POA: Diagnosis not present

## 2020-06-03 DIAGNOSIS — E1122 Type 2 diabetes mellitus with diabetic chronic kidney disease: Secondary | ICD-10-CM | POA: Diagnosis not present

## 2020-06-03 DIAGNOSIS — N1831 Chronic kidney disease, stage 3a: Secondary | ICD-10-CM | POA: Diagnosis not present

## 2020-06-03 DIAGNOSIS — E785 Hyperlipidemia, unspecified: Secondary | ICD-10-CM | POA: Diagnosis not present

## 2020-06-03 DIAGNOSIS — I129 Hypertensive chronic kidney disease with stage 1 through stage 4 chronic kidney disease, or unspecified chronic kidney disease: Secondary | ICD-10-CM | POA: Diagnosis not present

## 2020-06-03 NOTE — Progress Notes (Deleted)
Chronic Care Management Pharmacy  Name: Kayla Brown  MRN: [id number] DOB: 1951/04/21  Chief Complaint/HPI  Kayla Brown,  69 y.o. , female presents for their Follow-Up CCM visit with the clinical pharmacist via telephone due to COVID-19 Pandemic.  PCP : Sheliah Hatch, MD  Their chronic conditions include: Hypertension, Hyperlipidemia, Diabetes, Heart Failure and CKD.  Patient Active Problem List   Diagnosis Date Noted  . Type 2 diabetes mellitus with hyperglycemia, with long-term current use of insulin (HCC) 04/19/2020  . Left hip pain 12/01/2019  . Type 2 diabetes mellitus with stage 3b chronic kidney disease, without long-term current use of insulin (HCC) 04/03/2019  . Diastolic CHF (HCC) 03/18/2019  . Dyspnea 03/08/2019  . Pulmonary edema 03/04/2019  . Physical exam 02/06/2017  . Anxiety and depression 12/05/2016  . Obesity (BMI 30-39.9) 03/01/2016  . OSA (obstructive sleep apnea) 07/11/2015  . Pulmonary hypertension (HCC) 05/06/2015  . Heart murmur 03/03/2015  . Essential hypertension, benign 03/18/2014  . Hyperlipidemia 03/18/2014  . Post-menopause on HRT (hormone replacement therapy) 03/18/2014   Social History   Socioeconomic History  . Marital status: Married    Spouse name: Cherre Huger  . Number of children: Not on file  . Years of education: Not on file  . Highest education level: 12th grade  Occupational History  . Occupation: retired  Tobacco Use  . Smoking status: Never Smoker  . Smokeless tobacco: Never Used  Vaping Use  . Vaping Use: Never used  Substance and Sexual Activity  . Alcohol use: No    Alcohol/week: 0.0 standard drinks  . Drug use: No  . Sexual activity: Yes    Partners: Male    Birth control/protection: None  Other Topics Concern  . Not on file  Social History Narrative  . Not on file   Social Determinants of Health   Financial Resource Strain: Low Risk   . Difficulty of Paying Living Expenses: Not very hard  Food  Insecurity: No Food Insecurity  . Worried About Programme researcher, broadcasting/film/video in the Last Year: Never true  . Ran Out of Food in the Last Year: Never true  Transportation Needs: No Transportation Needs  . Lack of Transportation (Medical): No  . Lack of Transportation (Non-Medical): No  Physical Activity: Not on file  Stress: Stress Concern Present  . Feeling of Stress : Rather much  Social Connections: Not on file   Family History  Adopted: Yes  Problem Relation Age of Onset  . Alcohol abuse Mother   . ADD / ADHD Mother   . Dementia Mother   . Breast cancer Neg Hx    No Known Allergies  Outpatient Encounter Medications as of 06/03/2020  Medication Sig  . Alcohol Swabs (ALCOHOL PREP) PADS Pt uses an alcohol pad each time sugars are tested. Pt tests twice daily. Dx E11.9  . amLODipine (NORVASC) 10 MG tablet TAKE 1 TABLET BY MOUTH EVERY DAY  . atorvastatin (LIPITOR) 40 MG tablet TAKE 1 TABLET BY MOUTH EVERY DAY  . carvedilol (COREG) 25 MG tablet Take 1 tablet (25 mg total) by mouth 2 (two) times daily.  . Cholecalciferol (VITAMIN D PO) Take 2,000 Units by mouth daily.   . dapagliflozin propanediol (FARXIGA) 10 MG TABS tablet Take 10 mg by mouth daily.  . fish oil-omega-3 fatty acids 1000 MG capsule Take 1 g by mouth daily.  . fluticasone (FLONASE) 50 MCG/ACT nasal spray SPRAY 2 SPRAYS INTO EACH NOSTRIL EVERY DAY (Patient taking differently: Place 2  sprays into both nostrils daily.)  . furosemide (LASIX) 20 MG tablet TAKE 1 TABLET BY MOUTH EVERY DAY  . gabapentin (NEURONTIN) 100 MG capsule Take 100 mg by mouth 3 (three) times daily.  [name] glipiZIDE (GLUCOTROL) 5 MG tablet Take 1 tablet (5 mg total) by mouth 2 (two) times daily before a meal.  . glucose blood (FREESTYLE LITE) test strip USE TO TEST BLOOD GLUCOSE 2 TIMES DAILY. Dx E11.9  . loratadine (CLARITIN) 10 MG tablet TAKE 1 TABLET BY MOUTH EVERY DAY (Patient taking differently: Take 10 mg by mouth daily.)  . Melatonin 3 MG TABS Take 10 mg by  mouth at bedtime.   . metFORMIN (GLUCOPHAGE-XR) 500 MG 24 hr tablet Take 2 tablets (1,000 mg total) by mouth in the morning and at bedtime.  . methocarbamol (ROBAXIN) 500 MG tablet Take 500 mg by mouth 2 (two) times daily.  . methylPREDNISolone (MEDROL DOSEPAK) 4 MG TBPK tablet Take by mouth.  . Multiple Vitamins-Minerals (VISION FORMULA) TABS Take 1 tablet by mouth daily.  [name] OVER THE COUNTER MEDICATION Take 1 tablet by mouth daily. Equate Vision Formula 50+  . sertraline (ZOLOFT) 50 MG tablet TAKE 1 TABLET BY MOUTH EVERY DAY  . traMADol (ULTRAM) 50 MG tablet Take 1 tablet (50 mg total) by mouth every 6 (six) hours as needed.  . traMADol (ULTRAM) 50 MG tablet Take 1-2 tablets (50-100 mg total) by mouth daily as needed.  . TRUEPLUS LANCETS 30G MISC Use one lancet each time sugars are tested. Pt tests twice daily. Dx. E11.9  . UNABLE TO FIND Med Name: Healthwise talking meter   No facility-administered encounter medications on file as of 06/03/2020.   Patient Care Team    Relationship Specialty Notifications Start End  Sheliah Hatch, MD PCP - General Family Medicine  07/06/16   Carman Ching, MD (Inactive) Consulting Physician Gastroenterology  07/06/16   Jerene Bears, MD Consulting Physician Gynecology  07/06/16   Dahlia Byes, [facility] Pharmacist Pharmacist Admissions 10/02/19    Comment: phone number [phone]  Wanda Plump, RN [facility] Care Management  Admissions 12/23/19    Current Diagnosis/Assessment: Goals Addressed   None    Hypertension / HF   BP Readings from Last 3 Encounters:  06/02/20 (!) 137/57  05/23/20 125/60  04/18/20 (!) 142/72   EF>65% 02/2019. Patient has been checking home BP daily. Note readings below.  Previously on RAAS therapy with spironolactone and losartan but was stopped after developing AKI.   Recent home readings:  130s-140s/70s-80s, denies >150/90   Previously Reported Home BPs:   Morning (4/26-4/29):  135-141/57-67  Afternoon (4/14-4/16): 148-164/65-71  Currently taking:  Amlodipine 10 mg daily  Carvedilol 25 mg twice daily  Plan  Continue current medications.   Diabetes   A1c goal <7%  Lab Results  Component Value Date/Time   HGBA1C 7.2 (A) 03/04/2020 01:39 PM   HGBA1C 7.3 (H) 11/19/2019 02:50 PM   HGBA1C 6.7 (A) 08/28/2019 02:17 PM   HGBA1C 8.0 (H) 03/06/2019 04:40 AM   HGBA1C 6.7 02/06/2016 12:00 AM   MICROALBUR <0.7 05/23/2020 02:31 PM   MICROALBUR 4.7 (H) 05/25/2019 01:52 PM     Lab Results  Component Value Date/Time   HMDIABEYEEXA No Retinopathy 07/28/2019 12:00 AM    Lab Results  Component Value Date/Time   HMDIABFOOTEX completed 01/24/2016 12:00 AM   a1c not at goal. slight decrease to 7.2% on 03/04/2020 from 7.3% 10/2019.  Testing twice daily - once before breakfast, once before  bed.  Provided 10 readings over past ~5 days, overall - 141, 295, 132, 105, 130, 110, 131, 131, 146, 184.  Denies lows following glipizide dose increase.  Suspects that sweets might be contributing to her increased a1c - will cut down on icecream. Denies any hypoglycemia within the last month.  Previously on Actos (stopped-HF), Glipizide XL (stopped-minimize hypoglycemia), RAASi hx (stopped-AKI).  Patient is currently taking:   Metformin XL 500 mg two tabs (1000 mg) twice daily  Glipizide IR 5 mg every morning before breakfast and and before supper   We discussed: diet/exercise, limited exercise due to hip/knee pain, plans to do pool exercises at [employer]. Starting PT on Monday. Occasional slips with sugary snacks. Also discussed [employer] - pt plans to contact to help with medicare/LIS options. Agreed to fill out farxiga PAP application together - reviewed eligibility and pt is expected to qualify.  Plan  Continue current medications.  Farxiga PAP.  Medication Management / Care Coordination    Pt uses [facility] for all medications Uses pill box? Yes.  Plan  Continue current  medication management strategy.  Pt to contact [employer] to review Medicare plan options.   Will coordinate PAP application on Farxiga.   Follow up:  RPH: 3 month phone visit, PAP/insurance questions prn. CPA: Farxiga PAP, October DM/BP ______________ Visit Information SDOH (Social Determinants of Health) assessments performed: Yes.  Future Appointments  Date Time Provider Department Center  06/03/2020 12:30 PM [facility] C[facility] PHARMACIST [facility]  07/04/2020 11:00 AM Jetty Duhamel D, MD [facility] None  07/21/2020  9:30 AM Shamleffer, Konrad Dolores, MD [facility] None  07/28/2020  9:00 AM Wanda Plump, RN [facility] None  09/02/2020 10:10 AM Shamleffer, Konrad Dolores, MD [facility] None  11/18/2020  2:00 PM Sheliah Hatch, MD [facility]   Dahlia Byes, Pharm.D., BCGP Clinical Pharmacist [facility] [phone]

## 2020-06-03 NOTE — Chronic Care Management (AMB) (Signed)
  Chronic Care Management   Outreach Note   Name: Kayla Brown MRN: [id number] DOB: March 30, 1951  Referred by: Sheliah Hatch, MD Reason for referral: Telephone Appointment with [facility] Clinical Pharmacist, Dahlia Byes.   An unsuccessful telephone outreach was attempted today. The patient was referred to the pharmacist for assistance with care management and care coordination.   Telephone appointment with clinical pharmacist today (06/03/2020) at 1230pm. If patient immediately returns call, transfer to SV [phone] Otherwise, please provide  [phone] so patient can reschedule visit.   Dahlia Byes, Pharm.D., BCGP Clinical Pharmacist [facility] [phone]

## 2020-06-13 ENCOUNTER — Telehealth: Payer: Self-pay

## 2020-06-13 ENCOUNTER — Other Ambulatory Visit: Payer: Self-pay | Admitting: Nephrology

## 2020-06-13 DIAGNOSIS — E1122 Type 2 diabetes mellitus with diabetic chronic kidney disease: Secondary | ICD-10-CM

## 2020-06-13 DIAGNOSIS — N1831 Chronic kidney disease, stage 3a: Secondary | ICD-10-CM

## 2020-06-13 NOTE — Telephone Encounter (Signed)
Pt called in stating that her BP is doing well and her blood sugar is doing well.

## 2020-06-13 NOTE — Progress Notes (Unsigned)
Chronic Care Management Pharmacy Assistant   Name: Kayla Brown  MRN: [id number] DOB: [birth year]  Reason for Encounter: Disease State  PCP : Sheliah Hatch, MD  Allergies:  No Known Allergies  Medications: Outpatient Encounter Medications as of 06/13/2020  Medication Sig   Alcohol Swabs (ALCOHOL PREP) PADS Pt uses an alcohol pad each time sugars are tested. Pt tests twice daily. Dx E11.9   amLODipine (NORVASC) 10 MG tablet TAKE 1 TABLET BY MOUTH EVERY DAY   atorvastatin (LIPITOR) 40 MG tablet TAKE 1 TABLET BY MOUTH EVERY DAY   carvedilol (COREG) 25 MG tablet Take 1 tablet (25 mg total) by mouth 2 (two) times daily.   Cholecalciferol (VITAMIN D PO) Take 2,000 Units by mouth daily.    dapagliflozin propanediol (FARXIGA) 10 MG TABS tablet Take 10 mg by mouth daily.   fish oil-omega-3 fatty acids 1000 MG capsule Take 1 g by mouth daily.   fluticasone (FLONASE) 50 MCG/ACT nasal spray SPRAY 2 SPRAYS INTO EACH NOSTRIL EVERY DAY (Patient taking differently: Place 2 sprays into both nostrils daily.)   furosemide (LASIX) 20 MG tablet TAKE 1 TABLET BY MOUTH EVERY DAY   gabapentin (NEURONTIN) 100 MG capsule Take 100 mg by mouth 3 (three) times daily.   glipiZIDE (GLUCOTROL) 5 MG tablet Take 1 tablet (5 mg total) by mouth 2 (two) times daily before a meal.   glucose blood (FREESTYLE LITE) test strip USE TO TEST BLOOD GLUCOSE 2 TIMES DAILY. Dx E11.9   loratadine (CLARITIN) 10 MG tablet TAKE 1 TABLET BY MOUTH EVERY DAY (Patient taking differently: Take 10 mg by mouth daily.)   Melatonin 3 MG TABS Take 10 mg by mouth at bedtime.    metFORMIN (GLUCOPHAGE-XR) 500 MG 24 hr tablet Take 2 tablets (1,000 mg total) by mouth in the morning and at bedtime.   methocarbamol (ROBAXIN) 500 MG tablet Take 500 mg by mouth 2 (two) times daily.   methylPREDNISolone (MEDROL DOSEPAK) 4 MG TBPK tablet Take by mouth.   Multiple Vitamins-Minerals (VISION FORMULA) TABS Take 1 tablet by mouth  daily.   OVER THE COUNTER MEDICATION Take 1 tablet by mouth daily. Equate Vision Formula 50+   sertraline (ZOLOFT) 50 MG tablet TAKE 1 TABLET BY MOUTH EVERY DAY   traMADol (ULTRAM) 50 MG tablet Take 1 tablet (50 mg total) by mouth every 6 (six) hours as needed.   traMADol (ULTRAM) 50 MG tablet Take 1-2 tablets (50-100 mg total) by mouth daily as needed.   TRUEPLUS LANCETS 30G MISC Use one lancet each time sugars are tested. Pt tests twice daily. Dx. E11.9   UNABLE TO FIND Med Name: Healthwise talking meter   No facility-administered encounter medications on file as of 06/13/2020.    Current Diagnosis: Patient Active Problem List   Diagnosis Date Noted   Type 2 diabetes mellitus with hyperglycemia, with long-term current use of insulin (HCC) 04/19/2020   Left hip pain 12/01/2019   Type 2 diabetes mellitus with stage 3b chronic kidney disease, without long-term current use of insulin (HCC) 04/03/2019   Diastolic CHF (HCC) 03/18/2019   Dyspnea 03/08/2019   Pulmonary edema 03/04/2019   Physical exam 02/06/2017   Anxiety and depression 12/05/2016   Obesity (BMI 30-39.9) 03/01/2016   OSA (obstructive sleep apnea) 07/11/2015   Pulmonary hypertension (HCC) 05/06/2015   Heart murmur 03/03/2015   Essential hypertension, benign 03/18/2014   Hyperlipidemia 03/18/2014   Post-menopause on HRT (hormone replacement therapy) 03/18/2014     Recent Relevant Labs:  Lab Results  Component Value Date/Time   HGBA1C 7.2 (A) 03/04/2020 01:39 PM   HGBA1C 7.3 (H) 11/19/2019 02:50 PM   HGBA1C 6.7 (A) 08/28/2019 02:17 PM   HGBA1C 8.0 (H) 03/06/2019 04:40 AM   HGBA1C 6.7 02/06/2016 12:00 AM   MICROALBUR <0.7 05/23/2020 02:31 PM   MICROALBUR 4.7 (H) 05/25/2019 01:52 PM    Kidney Function Lab Results  Component Value Date/Time   CREATININE 1.24 (H) 05/23/2020 02:31 PM   CREATININE 1.08 11/19/2019 02:50 PM   CREATININE 0.97 03/30/2015 01:48 PM   GFR 44.83 (L) 05/23/2020 02:31 PM    GFRNONAA 48 (L) 11/09/2019 02:33 PM   GFRAA 56 (L) 11/09/2019 02:33 PM     Current antihyperglycemic regimen:  metFORMIN (GLUCOPHAGE-XR) 500 MG 24 hr tablet  What recent interventions/DTPs have been made to improve glycemic control:  o None Noted  Have there been any recent hospitalizations or ED visits since last visit with CPP? No  Patient {reports/denies:24182} hypoglycemic symptoms, including {Hypoglycemic Symptoms:3049003}  Patient {reports/denies:24182} hyperglycemic symptoms, including {symptoms; hyperglycemia:17903}  How often are you checking your blood sugar? {BG Testing frequency:23922}  What are your blood sugars ranging?  o Fasting: *** o Before meals: *** o After meals: *** o Bedtime: ***  During the week, how often does your blood glucose drop below 70? {LowBGfrequency:24142}  Are you checking your feet daily/regularly?   Adherence Review: Is the patient currently on a STATIN medication? Yes Is the patient currently on ACE/ARB medication? Yes Does the patient have >5 day gap between last estimated fill dates? CPP to request     Recent Office Vitals: BP Readings from Last 3 Encounters:  06/02/20 (!) 137/57  05/23/20 125/60  04/18/20 (!) 142/72   Pulse Readings from Last 3 Encounters:  06/02/20 (!) 55  05/23/20 (!) 56  04/18/20 86    Wt Readings from Last 3 Encounters:  06/02/20 176 lb (79.8 kg)  05/23/20 178 lb 9.6 oz (81 kg)  04/18/20 180 lb (81.6 kg)     Kidney Function Lab Results  Component Value Date/Time   CREATININE 1.24 (H) 05/23/2020 02:31 PM   CREATININE 1.08 11/19/2019 02:50 PM   CREATININE 0.97 03/30/2015 01:48 PM   GFR 44.83 (L) 05/23/2020 02:31 PM   GFRNONAA 48 (L) 11/09/2019 02:33 PM   GFRAA 56 (L) 11/09/2019 02:33 PM    BMP Latest Ref Rng & Units 05/23/2020 11/19/2019 11/09/2019  Glucose 70 - 99 mg/dL 016(W) 109(N) 235(T)  BUN 6 - 23 mg/dL 73(U) 20(U) 54(Y)  Creatinine 0.40 - 1.20 mg/dL 7.06(C) 3.76 2.83(T)  BUN/Creat  Ratio 12 - 28 - - 25  Sodium 135 - 145 mEq/L 139 140 143  Potassium 3.5 - 5.1 mEq/L 4.5 4.2 4.8  Chloride 96 - 112 mEq/L 102 104 104  CO2 19 - 32 mEq/L 27 26 24   Calcium 8.4 - 10.5 mg/dL 9.7 10.8(H)     Current antihypertensive regimen:                 -carvedilol (COREG) 25 MG tablet  How often are you checking your Blood Pressure? {CHL HP BP Monitoring Frequency:[phone]}  Current home BP readings: ***  What recent interventions/DTPs have been made by any provider to improve Blood Pressure control since last CPP Visit: ***  Any recent hospitalizations or ED visits since last visit with CPP? No  What diet changes have been made to improve Blood Pressure Control?  o ***  What exercise is being done to improve your  Blood Pressure Control?  o ***  Adherence Review: Is the patient currently on ACE/ARB medication? No Does the patient have >5 day gap between last estimated fill dates? CPP to review   Lynett Fish     Follow-Up:  Pharmacist Review

## 2020-06-16 ENCOUNTER — Ambulatory Visit: Payer: Medicare Other | Admitting: Internal Medicine

## 2020-06-30 ENCOUNTER — Other Ambulatory Visit: Payer: Medicare Other

## 2020-07-03 NOTE — Progress Notes (Signed)
HPI F never smoker followed for OSA, Dyspnea improved after treatment for heart failure. Complicated by anxiety and depression, DM2, HBP, Hyperlipidemia, Morbid obesity, OSA, Pulmonary Hypertension(PASP by echo 02/2015),   NPSG 05/14/16 AHI 93/ hr, desaturation to 84% PFT 04/04/15   - mild Diffusion defect, normal flows and volumes without response to bronchodilator PFT 04/24/2019- minimal obst w airtrapping, sl resp to BD, Nl DLCO 2D echo February 02, 2019 EF 60-65%, normal systolic function right ventricle, mild to moderately dilated left atrium, RV systolic function appeared to be normal with normal RV size 2D echo March 06, 2019 EF greater than 65%, moderate LVH, right ventricle normal systolic function, right ventricle systolic pressure could not be assessed. CT chest March 05, 2019 extensive atelectatic changes throughout the lungs with a dependent predominance, mosaic attenuation throughout the lungs, emphysema VQ scan 03/04/2019 no unmatched segmental perfusion defects to suggest acute PE Autoimmune work-up negative 2020 , SH /Occupational hx unrevealing  ------------------------------------------------------------------------------------------  12/14/19- 68 yoF never smoker followed for OSA, Dyspnea improved after treatment for heart failure. Complicated by anxiety and depression, DM2, HBP, Hyperlipidemia, Morbid obesity, Pulmonary Hypertension(PASP by echo 02/2015), CPAP auto 16/20/ [facility] Body weight today 182 lbs Download- not available Proair hfa CPAP machine is about 69 years old. She does well with it and reports good compliance and control. She wants [place] to manage her sleep apnea.  Had 2 Phizer Covax Paces herself outside, without need to usee her Proair rescue inhaler and no significant exacerbations, wheeze or cough.   07/04/20- 69 yoF never smoker followed for OSA, Dyspnea improved after treatment for heart failure. Complicated by anxiety and depression, DM2,  HBP, Hyperlipidemia, Morbid obesity, Pulmonary Hypertension(PASP by echo 02/2015), Ventolin hfa, Flonase, Melatonin,  CPAP 19/ [facility] Download- compliance 100%, AHI 0.6/ hr Body weight today- 175 lbs Covid vax-3 Phizer Flu vax-Had Saw Dr Lolita Rieger Cardiology for f/u in December. -----Patient states that in November machine started making a "wheezing" noise. States machine is working good.  She was given medrol taper in December for arthritis in her spine.  ROS-see HPI   + = positive Constitutional:    weight loss, night sweats, fevers, chills, fatigue, lassitude. HEENT:    headaches, difficulty swallowing, tooth/dental problems, sore throat,       sneezing, itching, ear ache, nasal congestion, post nasal drip, snoring CV:    chest pain, orthopnea, PND, swelling in lower extremities, anasarca,                                   dizziness, palpitations Resp:   +shortness of breath with exertion or at rest.                productive cough,   non-productive cough, coughing up of blood.              change in color of mucus.  wheezing.   Skin:    rash or lesions. GI:  No-   heartburn, indigestion, abdominal pain, nausea, vomiting, diarrhea,                 change in bowel habits, loss of appetite GU: dysuria, change in color of urine, no urgency or frequency.   flank pain. MS:  + joint pain, stiffness, decreased range of motion, back pain. Neuro-     nothing unusual Psych:  change in mood or affect.  depression or anxiety.   memory  loss.  OBJ- Physical Exam General- Alert, Oriented, Affect-appropriate, Distress- none acute, + obese Skin- rash-none, lesions- none, excoriation- none Lymphadenopathy- none Head- atraumatic            Eyes- +strabismus            Ears- Hearing, canals-normal            Nose- Clear, no-Septal dev, mucus, polyps, erosion, perforation             Throat- Mallampati II , mucosa clear , drainage- none, tonsils- atrophic Neck- flexible , trachea midline, no  stridor , thyroid nl, carotid no bruit Chest - symmetrical excursion , unlabored           Heart/CV- RRR , + trace S murmur , no gallop  , no rub, nl s1 s2                           - JVD- none , edema- none, stasis changes- none, varices- none           Lung- +clear, wheeze- none, cough- none , dullness-none, rub- none           Chest wall-  Abd-  Br/ Gen/ Rectal- Not done, not indicated Extrem- cyanosis- none, clubbing, none, atrophy- none, strength- nl Neuro- grossly intact to observation

## 2020-07-04 ENCOUNTER — Other Ambulatory Visit: Payer: Self-pay

## 2020-07-04 ENCOUNTER — Encounter: Payer: Self-pay | Admitting: Internal Medicine

## 2020-07-04 ENCOUNTER — Ambulatory Visit (INDEPENDENT_AMBULATORY_CARE_PROVIDER_SITE_OTHER): Payer: Medicare Other | Admitting: Internal Medicine

## 2020-07-04 ENCOUNTER — Telehealth: Payer: Self-pay

## 2020-07-04 DIAGNOSIS — I272 Pulmonary hypertension, unspecified: Secondary | ICD-10-CM

## 2020-07-04 DIAGNOSIS — G4733 Obstructive sleep apnea (adult) (pediatric): Secondary | ICD-10-CM

## 2020-07-04 NOTE — Progress Notes (Signed)
Patient called and would like to speak to cpp regarding tresiba . Patient stated her patient assistance application for tresiba was accepted for the year . Patient stated she has two weeks left of medications and was wondering how to get refills .    Aloha Gell ,[facility] Clinical Pharmacist Assistant [phone]

## 2020-07-04 NOTE — Patient Instructions (Addendum)
Ok to keep your current Ventiolin albuterol rescue inhaler in case needed. Let us know if ou need a refill.  Order- DME [facility]- please service CPAP machine- making noise. We can continue CPAP 19 or auto 16-20, mask of choice, humidifier, airview/ card.  Please call if we can help

## 2020-07-05 ENCOUNTER — Ambulatory Visit
Admission: RE | Admit: 2020-07-05 | Discharge: 2020-07-05 | Disposition: A | Discharge status: Home / Self Care | Payer: Medicare Other | Source: Ambulatory Visit | Attending: Nephrology | Admitting: Nephrology

## 2020-07-05 DIAGNOSIS — N1831 Chronic kidney disease, stage 3a: Secondary | ICD-10-CM | POA: Diagnosis not present

## 2020-07-05 DIAGNOSIS — E1122 Type 2 diabetes mellitus with diabetic chronic kidney disease: Secondary | ICD-10-CM

## 2020-07-05 DIAGNOSIS — N182 Chronic kidney disease, stage 2 (mild): Secondary | ICD-10-CM

## 2020-07-07 ENCOUNTER — Other Ambulatory Visit: Payer: Self-pay | Admitting: Family Medicine

## 2020-07-07 ENCOUNTER — Other Ambulatory Visit: Payer: Self-pay

## 2020-07-07 DIAGNOSIS — F32A Depression, unspecified: Secondary | ICD-10-CM

## 2020-07-07 DIAGNOSIS — F419 Anxiety disorder, unspecified: Secondary | ICD-10-CM

## 2020-07-07 MED ORDER — SERTRALINE HCL 50 MG PO TABS: 50.0000 mg | ORAL_TABLET | Freq: Every day | ORAL | 1 refills | Status: DC

## 2020-07-07 NOTE — Telephone Encounter (Signed)
Requesting:zoloft 50mg   Contract: UDS: Last Visit11/29/21: Next Visit:11/18/20 Last Refill:09/30/19  Please Advise

## 2020-07-07 NOTE — Telephone Encounter (Signed)
Spoke with patient regarding Kayla Brown patient assistance. Provided refill number [phone] for [place] P[employer] refill line

## 2020-07-07 NOTE — Telephone Encounter (Signed)
Rx sent to patient pharmacy.

## 2020-07-07 NOTE — Telephone Encounter (Signed)
Not a controlled medication.  Patient recently seen by primary care provider.  Okay to refill per protocol.

## 2020-07-21 ENCOUNTER — Ambulatory Visit (INDEPENDENT_AMBULATORY_CARE_PROVIDER_SITE_OTHER): Payer: Medicare Other | Admitting: Internal Medicine

## 2020-07-21 ENCOUNTER — Encounter: Payer: Self-pay | Admitting: Internal Medicine

## 2020-07-21 ENCOUNTER — Other Ambulatory Visit: Payer: Self-pay

## 2020-07-21 VITALS — BP 130/82 | HR 60 | Ht 60.0 in | Wt 175.0 lb

## 2020-07-21 DIAGNOSIS — N1831 Chronic kidney disease, stage 3a: Secondary | ICD-10-CM

## 2020-07-21 DIAGNOSIS — E1122 Type 2 diabetes mellitus with diabetic chronic kidney disease: Secondary | ICD-10-CM | POA: Insufficient documentation

## 2020-07-21 LAB — POCT GLYCOSYLATED HEMOGLOBIN (HGB A1C): Hemoglobin A1C: 6.7 % — AB (ref 4.0–5.6)

## 2020-07-21 LAB — BASIC METABOLIC PANEL
BUN: 39 mg/dL — ABNORMAL HIGH (ref 6–23)
CO2: 27 mEq/L (ref 19–32)
Calcium: 10.3 mg/dL (ref 8.4–10.5)
Chloride: 106 mEq/L (ref 96–112)
Creatinine, Ser: 1.26 mg/dL — ABNORMAL HIGH (ref 0.40–1.20)
GFR: 43.92 mL/min — ABNORMAL LOW (ref >60.00)
Glucose, Bld: 146 mg/dL — ABNORMAL HIGH (ref 70–99)
Potassium: 4.5 mEq/L (ref 3.5–5.1)
Sodium: 140 mEq/L (ref 135–145)

## 2020-07-21 LAB — POCT GLUCOSE (DEVICE FOR HOME USE): Glucose Fasting, POC: 166 mg/dL — AB (ref 70–99)

## 2020-07-21 MED ORDER — METFORMIN HCL ER 500 MG PO TB24: 500.0000 mg | ORAL_TABLET | Freq: Two times a day (BID) | ORAL | 3 refills | Status: DC

## 2020-07-21 MED ORDER — DAPAGLIFLOZIN PROPANEDIOL 10 MG PO TABS: 10.0000 mg | ORAL_TABLET | Freq: Every day | ORAL | 3 refills | Status: DC

## 2020-07-21 NOTE — Patient Instructions (Signed)
-    Continue Glipizide 5 mg, 1 tablet Before breakfast and 1 tablet before supper  - Continue Metformin 500 mg , 2 tablets with Breakfast and 2 tablets before Supper - Continue Farxiga 10 mg, 1 tablet daily with Breakfast      - HOW TO TREAT LOW BLOOD SUGARS (Blood sugar LESS THAN 70 MG/DL)  Please follow the RULE OF 15 for the treatment of hypoglycemia treatment (when your (blood sugars are less than 70 mg/dL)    STEP 1: Take 15 grams of carbohydrates when your blood sugar is low, which includes:   3-4 GLUCOSE TABS  OR  3-4 OZ OF JUICE OR REGULAR SODA OR  ONE TUBE OF GLUCOSE GEL     STEP 2: RECHECK blood sugar in 15 MINUTES STEP 3: If your blood sugar is still low at the 15 minute recheck --> then, go back to STEP 1 and treat AGAIN with another 15 grams of carbohydrates.

## 2020-07-21 NOTE — Progress Notes (Signed)
Name: Kayla Brown  Age/ Sex: 70 y.o., female   MRN/ DOB: [id number], 1951/04/21     PCP: Sheliah Hatch, MD   Reason for Endocrinology Evaluation: Type 2 Diabetes Mellitus  Initial Endocrine Consultative Visit: 04/06/2019    PATIENT IDENTIFIER: Kayla Brown is a 70 y.o. female with a past medical history of HTN, CHF and T2DM . The patient has followed with Endocrinology clinic since 04/06/2019 for consultative assistance with management of her diabetes.  DIABETIC HISTORY:  Kayla Brown was diagnosed with T2DM in 2001. She was unable to start Jardiance due to cost . Actos had to be stopped due to CHF. Her hemoglobin A1c has ranged from 6.7% in 2017, peaking at 8.1% in 2020  On her initial visit to our clinic she had an A1c of 8.0%. She was on [employer] and Metformin. We switched Glipizide XL to regular Glipizide and continued metformin   Was started on farxiga through 03/2020  SUBJECTIVE:   During the last visit (04/18/2020): A1c of 7.2 %. We continued metformin, Glipizide and restarted Kayla Brown     Today (04/18/2020): Kayla Brown is here for a follow up on diabetes management.  She checks her blood sugars 2 times daily. The patient has not had hypoglycemic episodes since the last clinic visit  Denies nausea and diarrhea   Nephrology- Dr. Bufford Buttner     HOME DIABETES REGIMEN:  Glipizide 5 mg, 1 tablet BID Metformin 500 mg 2 tablets with Breakfast and 2 tablets before Supper Farxiga 10 mg daily    GLUCOSE LOG:  Fasting 104- 180 mg/dL  PM 188- 416 mg/dL    DIABETIC COMPLICATIONS: Microvascular complications:   CKD   Denies: neuropathy , retinopathy   Last eye exam: Completed 06/2018  Macrovascular complications:   CHF  Denies: CAD, PVD, CVA  HISTORY:  Past Medical History:  Past Medical History:  Diagnosis Date  . Anxiety   . Diabetes mellitus   . Edema of extremities 03/02/2016  . Essential hypertension, benign   . Lazy eye   . Major  depressive disorder, single episode, unspecified   . Obesity (BMI 30-39.9) 03/01/2016  . OSA (obstructive sleep apnea)   . Pulmonary HTN (HCC)    mild with PASP by echo 05/2016  . Pure hypercholesterolemia   . Retinopathy, due to hypertension    Past Surgical History:  Past Surgical History:  Procedure Laterality Date  . CPAP TITRATION  10/14/2015  . DILATION AND CURETTAGE OF UTERUS  2005-2008   x2  . EYE SURGERY     cataracts  . WISDOM TOOTH EXTRACTION      Social History:  reports that she has never smoked. She has never used smokeless tobacco. She reports that she does not drink alcohol and does not use drugs. Family History:  Family History  Adopted: Yes  Problem Relation Age of Onset  . Alcohol abuse Mother   . ADD / ADHD Mother   . Dementia Mother   . Breast cancer Neg Hx      HOME MEDICATIONS: Allergies as of 07/21/2020   No Known Allergies     Medication List       Accurate as of July 21, 2020  9:57 AM. If you have any questions, ask your nurse or doctor.        albuterol 108 (90 Base) MCG/ACT inhaler Commonly known as: VENTOLIN HFA Inhale 1-2 puffs into the lungs every 6 (six) hours as needed for wheezing or shortness of  breath.   Alcohol Prep Pads Pt uses an alcohol pad each time sugars are tested. Pt tests twice daily. Dx E11.9   amLODipine 10 MG tablet Commonly known as: NORVASC TAKE 1 TABLET BY MOUTH EVERY DAY   atorvastatin 40 MG tablet Commonly known as: LIPITOR TAKE 1 TABLET BY MOUTH EVERY DAY   carvedilol 25 MG tablet Commonly known as: COREG Take 1 tablet (25 mg total) by mouth 2 (two) times daily.   dapagliflozin propanediol 10 MG Tabs tablet Commonly known as: FARXIGA Take 1 tablet (10 mg total) by mouth daily.   fish oil-omega-3 fatty acids 1000 MG capsule Take 1 g by mouth daily.   fluticasone 50 MCG/ACT nasal spray Commonly known as: FLONASE SPRAY 2 SPRAYS INTO EACH NOSTRIL EVERY DAY What changed: See the new  instructions.   furosemide 20 MG tablet Commonly known as: LASIX TAKE 1 TABLET BY MOUTH EVERY DAY   gabapentin 100 MG capsule Commonly known as: NEURONTIN Take 100 mg by mouth 3 (three) times daily.   glipiZIDE 5 MG tablet Commonly known as: GLUCOTROL Take 1 tablet (5 mg total) by mouth 2 (two) times daily before a meal.   glucose blood test strip Commonly known as: FREESTYLE LITE USE TO TEST BLOOD GLUCOSE 2 TIMES DAILY. Dx E11.9   loratadine 10 MG tablet Commonly known as: CLARITIN TAKE 1 TABLET BY MOUTH EVERY DAY   melatonin 3 MG Tabs tablet Take 10 mg by mouth at bedtime.   metFORMIN 500 MG 24 hr tablet Commonly known as: GLUCOPHAGE-XR Take 2 tablets (1,000 mg total) by mouth in the morning and at bedtime.   methocarbamol 500 MG tablet Commonly known as: ROBAXIN Take 500 mg by mouth 2 (two) times daily.   methylPREDNISolone 4 MG Tbpk tablet Commonly known as: MEDROL DOSEPAK Take by mouth.   OVER THE COUNTER MEDICATION Take 1 tablet by mouth daily. Equate Vision Formula 50+   sertraline 50 MG tablet Commonly known as: ZOLOFT Take 1 tablet (50 mg total) by mouth daily.   traMADol 50 MG tablet Commonly known as: ULTRAM Take 1 tablet (50 mg total) by mouth every 6 (six) hours as needed.   traMADol 50 MG tablet Commonly known as: ULTRAM Take 1-2 tablets (50-100 mg total) by mouth daily as needed.   TRUEplus Lancets 30G Misc Use one lancet each time sugars are tested. Pt tests twice daily. Dx. E11.9   UNABLE TO FIND Med Name: Healthwise talking meter   Vision Formula Tabs Take 1 tablet by mouth daily.   VITAMIN D PO Take 2,000 Units by mouth daily.        OBJECTIVE:   Vital Signs: BP 130/82   Pulse 60   Ht 5' (1.524 m)   Wt 175 lb (79.4 kg)   LMP 06/25/2008   SpO2 98%   BMI 34.18 kg/m   Wt Readings from Last 3 Encounters:  07/21/20 175 lb (79.4 kg)  07/04/20 175 lb (79.4 kg)  06/02/20 176 lb (79.8 kg)     Exam: General: Pt appears  well and is in NAD  Neck: General: Supple without adenopathy. Thyroid: Thyroid size normal.  No goiter or nodules appreciated.  Lungs: Clear with good BS bilat with no rales, rhonchi, or wheezes  Heart: RRR with normal S1 and S2 and no gallops; no murmurs; no rub  Extremities: No pretibial edema.   Neuro: MS is good with appropriate affect, pt is alert and Ox3   DM foot exam: 03/04/2020  The  skin of the feet is without sores or ulcerations. The pedal pulses are 2+ on right and 2+ on left. The sensation is intact to a screening 5.07, 10 gram monofilament bilaterally    DATA REVIEWED:  Lab Results  Component Value Date   HGBA1C 6.7 (A) 07/21/2020   HGBA1C 7.2 (A) 03/04/2020   HGBA1C 7.3 (H) 11/19/2019   Lab Results  Component Value Date   MICROALBUR <0.7 05/23/2020   LDLCALC 61 05/25/2019   CREATININE 1.24 (H) 05/23/2020   Lab Results  Component Value Date   MICRALBCREAT 3.5 05/23/2020     Lab Results  Component Value Date   CHOL 115 05/23/2020   HDL 45.70 05/23/2020   LDLCALC 61 05/25/2019   LDLDIRECT 50.0 05/23/2020   TRIG 232.0 (H) 05/23/2020   CHOLHDL 3 05/23/2020      Results for TERESA, NICODEMUS (MRN [id number]) as of 07/21/2020 14:20  Ref. Range 07/21/2020 10:09  Sodium Latest Ref Range: 135 - 145 mEq/L 140  Potassium Latest Ref Range: 3.5 - 5.1 mEq/L 4.5  Chloride Latest Ref Range: 96 - 112 mEq/L 106  CO2 Latest Ref Range: 19 - 32 mEq/L 27  Glucose Latest Ref Range: 70 - 99 mg/dL 737 (H)  BUN Latest Ref Range: 6 - 23 mg/dL 39 (H)  Creatinine Latest Ref Range: 0.40 - 1.20 mg/dL 1.06 (H)  Calcium Latest Ref Range: 8.4 - 10.5 mg/dL 26.9  GFR Latest Ref Range: >60.00 mL/min 43.92 (L)    ASSESSMENT / PLAN / RECOMMENDATIONS:   1) Type 2 Diabetes Mellitus, Optimally controlled, With CKD III complications - Most recent A1c of 6.7 %. Goal A1c < 7.0%.     - I have congratulated her on the optimizing of her glucose control  - She is currently under the care of  nephrology for CKD III, due to GFR consistentl < 45, I am going to reduce Metformin by 50 % - BMP shows stable renal function     MEDICATIONS: Continue Glipizide 5 mg, 1 tablet BID  Decrease  Metformin 500 mg 1 tablet with Breakfast and 1 tablet with  Supper Continue Farxiga 10 mg, 1 tablet with breakfast    EDUCATION / INSTRUCTIONS:  BG monitoring instructions: Patient is instructed to check her blood sugars 1 times a day, fasting   Call [place] [facility] if: BG persistently < 70  . I reviewed the Rule of 15 for the treatment of hypoglycemia in detail with the patient. Literature supplied.     F/U in 6 months    Signed electronically by: Lyndle Herrlich, MD  [facility]  [facility] [address] [place], [place] [address] Phone: [phone] FAX: [phone]   CC: Sheliah Hatch, MD [address] [place] [address] Phone: [phone]  Fax: [phone]  Return to Endocrinology clinic as below: Future Appointments  Date Time Provider Department Center  07/28/2020  9:00 AM Hoyt Koch, Pryor Ochoa, RN [facility] None  09/02/2020 10:10 AM Willard Madrigal, Konrad Dolores, MD [facility] None  11/18/2020  2:00 PM Sheliah Hatch, MD [facility]  07/04/2021  9:00 AM Waymon Budge, MD [facility] None

## 2020-07-25 DIAGNOSIS — Z6831 Body mass index (BMI) 31.0-31.9, adult: Secondary | ICD-10-CM | POA: Diagnosis not present

## 2020-07-25 DIAGNOSIS — I10 Essential (primary) hypertension: Secondary | ICD-10-CM | POA: Diagnosis not present

## 2020-07-25 DIAGNOSIS — M5416 Radiculopathy, lumbar region: Secondary | ICD-10-CM | POA: Diagnosis not present

## 2020-07-25 DIAGNOSIS — M4316 Spondylolisthesis, lumbar region: Secondary | ICD-10-CM | POA: Diagnosis not present

## 2020-07-28 ENCOUNTER — Other Ambulatory Visit: Payer: Self-pay | Admitting: *Deleted

## 2020-07-28 ENCOUNTER — Other Ambulatory Visit: Payer: Self-pay | Admitting: Family Medicine

## 2020-07-28 NOTE — Patient Instructions (Signed)
Goals Addressed            This Visit's Progress   . Patient will not go to the ED or Hospital due to CHF exacerbation within the next 90 days       CARE PLAN ENTRY (see longtitudinal plan of care for additional care plan information)   Current Barriers:  Kayla Brown Knowledge deficit related to basic heart failure pathophysiology and self care management  Case Manager Clinical Goal(s):  Kayla Brown Over the next 90 days, patient will verbalize understanding of Heart Failure Action Plan and when to call doctor . Patient states she does have the written [facility] CHF Zones and actions plans . Over the next 90 days patient will verbalize adhering to low sodium diet . Over the next 90 days patient will verbalize increasing her physical activity as  tolerated . Patient will verbalized taking her B/P daily and recording the values.   Interventions:  . Provided verbal education on low sodium diet . Reviewed Heart Failure Action Plan in depth and provided written copy . Discussed importance of daily weight and advised patient to weigh and record daily . Reviewed role of diuretics in prevention of fluid overload and management of heart failure . Encouraged patient to continue to take her B/P daily and record the values  Patient Self Care Activities:  . Takes Heart Failure Medications as prescribed . Weighs daily and record (notifying MD of 3 lb weight gain over night or 5 lb in a week) . Verbalizes understanding of and follows CHF Action Plan . Patient   does have an action plan with her cardiologist to take additional lasix with weight gain, fluid restrict to 2000 cc daily, and to keep her sodium intake to 2000 mg daily. She also follows the [facility] CHF zones and action plans. . Patient continues to do PT exercises at home for her back routinely and recently had a back injection that has helped with her pain. . Patient takes her B/P daily and records the value.  Please see past updates related to this goal by  clicking on the "Past Updates" button in the selected goal  Updated: 05/27/20  Timeframe:  Long-Range Goal Priority:  High Start Date:  01/19/20                          Expected End Date: 12/22/20 Follow Up Date: 10/22/20 Updated: 07/28/20                           . THN Patient will do her back exercises  and back stretches 3-4 times a week for the next 90 days       Timeframe:  Long-Range Goal Priority:  High Start Date: 07/28/20                            Expected End Date:  12/22/20                    Follow Up Date: 10/22/20  Patient states she want to increase her physical activity. She will continue to do her PT back exercises and back stretches 3-4 times weekly. Patient and husband plan to start walking when the weather gets better 2-3 times weekly up to the apartment's club house and back to their apartment.     Kayla Brown Patient will drink 2 bottles of water daily for  the next 90 days       Timeframe:  Long-Range Goal Priority:  Medium Start Date: 07/28/20                            Expected End Date:  12/22/20 Follow Up Date: 10/22/20  Patient states she wants to increase the amount of water she drinks daily and set a goal of drinking 2 bottles of water daily.                         Kayla Brown Patient will verbalize continuation of following the rescue plan she had developed with her cardiologist for the next 90 days       Timeframe:  Long-Range Goal Priority:  High Start Date:  04/01/20                           Expected End Date: 12/22/20                      Follow Up Date 10/22/20   - begin a heart failure diary - bring diary to all appointments - develop a rescue plan - eat more whole grains, fruits and vegetables, lean meats and healthy fats - follow rescue plan if symptoms flare-up - know when to call the doctor - track symptoms and what helps feel better or worse    Why is this important?   You will be able to handle your symptoms better if you keep track of them.  Making  some simple changes to your lifestyle will help.  Eating healthy is one thing you can do to take good care of yourself.    Notes: Patient has developed an action plan with her cardiologist, she follows the [facility] CHF zones and action plans. Patient weighs herself and records the values daily.  Updated:05/27/20 Updated 07/28/20: Patient continues to monitor her weight daily and record her values. She is adhering to a low sodium diet and a fluid restriction of up to 2000 cc daily. She is following the rescue plan she has with her cardiologist and reports not having to take any additional lasix in several weeks.        Constipation, Adult Constipation is when a person has trouble pooping (having a bowel movement). When you have this condition, you may poop fewer than 3 times a week. Your poop (stool) may also be dry, hard, or bigger than normal. Follow these instructions at home: Eating and drinking  Eat foods that have a lot of fiber, such as: ? Fresh fruits and vegetables. ? Whole grains. ? Beans.  Eat less of foods that are low in fiber and high in fat and sugar, such as: ? [place] fries. ? Hamburgers. ? Cookies. ? Candy. ? Soda.  Drink enough fluid to keep your pee (urine) pale yellow.   General instructions  Exercise regularly or as told by your doctor. Try to do 150 minutes of exercise each week.  Go to the restroom when you feel like you need to poop. Do not hold it in.  Take over-the-counter and prescription medicines only as told by your doctor. These include any fiber supplements.  When you poop: ? Do deep breathing while relaxing your lower belly (abdomen). ? Relax your pelvic floor. The pelvic floor is a group of muscles that support the rectum, bladder, and intestines (as well  as the uterus in women).  Watch your condition for any changes. Tell your doctor if you notice any.  Keep all follow-up visits as told by your doctor. This is important. Contact a doctor if:  You  have pain that gets worse.  You have a fever.  You have not pooped for 4 days.  You vomit.  You are not hungry.  You lose weight.  You are bleeding from the opening of the butt (anus).  You have thin, pencil-like poop. Get help right away if:  You have a fever, and your symptoms suddenly get worse.  You leak poop or have blood in your poop.  Your belly feels hard or bigger than normal (bloated).  You have very bad belly pain.  You feel dizzy or you faint. Summary  Constipation is when a person poops fewer than 3 times a week, has trouble pooping, or has poop that is dry, hard, or bigger than normal.  Eat foods that have a lot of fiber.  Drink enough fluid to keep your pee (urine) pale yellow.  Take over-the-counter and prescription medicines only as told by your doctor. These include any fiber supplements. This information is not intended to replace advice given to you by your health care provider. Make sure you discuss any questions you have with your health care provider. Document Revised: 04/29/2019 Document Reviewed: 04/29/2019 Elsevier Patient Education  2021 [employer].

## 2020-07-28 NOTE — Patient Outreach (Signed)
[facility]) Care Management  [facility] Care Manager  07/28/2020   Kayla Brown 11-Mar-1951 [id number]  Subjective: Successful telephone outreach call to patient. HIPAA identifiers obtained. Patient reports her back pain is much better after she received and back injection. Patient continues to do PT back exercise and back stretches at home. She does have a follow up appointment with her orthopedist in April and plans to discuss concerns she has regarding them stating she may need back surgery in the future. Patient reports that her CHF is controlled presently. Her weight has been stable and she continues to follow Dr. Elvis Coil instructions of limiting sodium to 2000 mg and a fluid restriction up to 2000 cc daily. Patient added that she has not had to take an additional lasix tablet lately. Patient states that she continues to monitor her weight, B/P, and blood sugar daily and record the values. Nurse will send her a calendar booklet that has education about CHF, hypertension, diabetes, and areas where she can record all of her values. She discussed having bouts of constipation and nurse will send constipation education. Patient states she has not had any recent falls and denies any needs for DME. Patient did not have any further questions or concerns. She did confirm that she has this nurse's contact number and she will call her if needed.   Encounter Medications:  Outpatient Encounter Medications as of 07/28/2020  Medication Sig  . albuterol (VENTOLIN HFA) 108 (90 Base) MCG/ACT inhaler Inhale 1-2 puffs into the lungs every 6 (six) hours as needed for wheezing or shortness of breath.  . Alcohol Swabs (ALCOHOL PREP) PADS Pt uses an alcohol pad each time sugars are tested. Pt tests twice daily. Dx E11.9  . amLODipine (NORVASC) 10 MG tablet TAKE 1 TABLET BY MOUTH EVERY DAY  . atorvastatin (LIPITOR) 40 MG tablet TAKE 1 TABLET BY MOUTH EVERY DAY  . carvedilol (COREG) 25 MG tablet Take 1 tablet  (25 mg total) by mouth 2 (two) times daily.  . Cholecalciferol (VITAMIN D PO) Take 2,000 Units by mouth daily.   . dapagliflozin propanediol (FARXIGA) 10 MG TABS tablet Take 1 tablet (10 mg total) by mouth daily.  . fish oil-omega-3 fatty acids 1000 MG capsule Take 1 g by mouth daily.  . fluticasone (FLONASE) 50 MCG/ACT nasal spray SPRAY 2 SPRAYS INTO EACH NOSTRIL EVERY DAY (Patient taking differently: Place 2 sprays into both nostrils daily.)  . furosemide (LASIX) 20 MG tablet TAKE 1 TABLET BY MOUTH EVERY DAY  . gabapentin (NEURONTIN) 100 MG capsule Take 100 mg by mouth 3 (three) times daily.  [name] glipiZIDE (GLUCOTROL) 5 MG tablet Take 1 tablet (5 mg total) by mouth 2 (two) times daily before a meal.  . glucose blood (FREESTYLE LITE) test strip USE TO TEST BLOOD GLUCOSE 2 TIMES DAILY. Dx E11.9  . loratadine (CLARITIN) 10 MG tablet TAKE 1 TABLET BY MOUTH EVERY DAY (Patient taking differently: Take 10 mg by mouth daily.)  . Melatonin 3 MG TABS Take 10 mg by mouth at bedtime.   . metFORMIN (GLUCOPHAGE-XR) 500 MG 24 hr tablet Take 1 tablet (500 mg total) by mouth in the morning and at bedtime.  . methocarbamol (ROBAXIN) 500 MG tablet Take 500 mg by mouth 2 (two) times daily.  . methylPREDNISolone (MEDROL DOSEPAK) 4 MG TBPK tablet Take by mouth.  . Multiple Vitamins-Minerals (VISION FORMULA) TABS Take 1 tablet by mouth daily.  [name] OVER THE COUNTER MEDICATION Take 1 tablet by mouth daily. Equate Vision  Formula 50+  . sertraline (ZOLOFT) 50 MG tablet Take 1 tablet (50 mg total) by mouth daily.  . traMADol (ULTRAM) 50 MG tablet Take 1 tablet (50 mg total) by mouth every 6 (six) hours as needed.  . traMADol (ULTRAM) 50 MG tablet Take 1-2 tablets (50-100 mg total) by mouth daily as needed.  . TRUEPLUS LANCETS 30G MISC Use one lancet each time sugars are tested. Pt tests twice daily. Dx. E11.9  . UNABLE TO FIND Med Name: Healthwise talking meter   No facility-administered encounter medications on file as of  07/28/2020.    Functional Status:  In your present state of health, do you have any difficulty performing the following activities: 05/23/2020 01/19/2020  Hearing? N Y  Comment - has hearing aids  Vision? N N  Difficulty concentrating or making decisions? N N  Walking or climbing stairs? N Y  Comment - patient uses a walker with long distances and does have difficulty climbing stairs  Dressing or bathing? N N  Doing errands, shopping? N N  Preparing Food and eating ? - N  Using the Toilet? - N  In the past six months, have you accidently leaked urine? - N  Do you have problems with loss of bowel control? - N  Managing your Medications? - N  Managing your Finances? - N  Housekeeping or managing your Housekeeping? - N  Some recent data might be hidden    Fall/Depression Screening: Fall Risk  05/27/2020 05/27/2020 05/23/2020  Falls in the past year? 0 0 0  Number falls in past yr: 0 0 0  Comment - - -  Injury with Fall? 0 0 0  Risk for fall due to : - No Fall Risks No Fall Risks  Follow up Falls evaluation completed;Education provided;Falls prevention discussed Falls prevention discussed;Education provided;Falls evaluation completed -   PHQ 2/9 Scores 05/23/2020 04/01/2020 01/19/2020 11/19/2019 05/25/2019 03/20/2019 03/17/2019  PHQ - 2 Score 0 0 0 0 0 0 0  PHQ- 9 Score 0 - - 0 0 0 -    Assessment:  Goals Addressed            This Visit's Progress   . Patient will not go to the ED or Hospital due to CHF exacerbation within the next 90 days       CARE PLAN ENTRY (see longtitudinal plan of care for additional care plan information)   Current Barriers:  [name] Knowledge deficit related to basic heart failure pathophysiology and self care management  Case Manager Clinical Goal(s):  [name] Over the next 90 days, patient will verbalize understanding of Heart Failure Action Plan and when to call doctor . Patient states she does have the written [facility] CHF Zones and actions plans . Over the next  90 days patient will verbalize adhering to low sodium diet . Over the next 90 days patient will verbalize increasing her physical activity as  tolerated . Patient will verbalized taking her B/P daily and recording the values.   Interventions:  . Provided verbal education on low sodium diet . Reviewed Heart Failure Action Plan in depth and provided written copy . Discussed importance of daily weight and advised patient to weigh and record daily . Reviewed role of diuretics in prevention of fluid overload and management of heart failure . Encouraged patient to continue to take her B/P daily and record the values  Patient Self Care Activities:  . Takes Heart Failure Medications as prescribed . Weighs daily and record (notifying MD  of 3 lb weight gain over night or 5 lb in a week) . Verbalizes understanding of and follows CHF Action Plan . Patient   does have an action plan with her cardiologist to take additional lasix with weight gain, fluid restrict to 2000 cc daily, and to keep her sodium intake to 2000 mg daily. She also follows the [facility] CHF zones and action plans. . Patient continues to do PT exercises at home for her back routinely and recently had a back injection that has helped with her pain. . Patient takes her B/P daily and records the value.  Please see past updates related to this goal by clicking on the "Past Updates" button in the selected goal  Updated: 05/27/20  Timeframe:  Long-Range Goal Priority:  High Start Date:  01/19/20                          Expected End Date: 12/22/20 Follow Up Date: 10/22/20 Updated: 07/28/20                           . THN Patient will do her back exercises  and back stretches 3-4 times a week for the next 90 days       Timeframe:  Long-Range Goal Priority:  High Start Date: 07/28/20                            Expected End Date:  12/22/20                    Follow Up Date: 10/22/20  Patient states she want to increase her physical activity. She  will continue to do her PT back exercises and back stretches 3-4 times weekly. Patient and husband plan to start walking when the weather gets better 2-3 times weekly up to the apartment's club house and back to their apartment.     Kayla Brown Patient will drink 2 bottles of water daily for the next 90 days       Timeframe:  Long-Range Goal Priority:  Medium Start Date: 07/28/20                            Expected End Date:  12/22/20 Follow Up Date: 10/22/20  Patient states she wants to increase the amount of water she drinks daily and set a goal of drinking 2 bottles of water daily.                         Kayla Brown Patient will verbalize continuation of following the rescue plan she had developed with her cardiologist for the next 90 days       Timeframe:  Long-Range Goal Priority:  High Start Date:  04/01/20                           Expected End Date: 12/22/20                      Follow Up Date 10/22/20   - begin a heart failure diary - bring diary to all appointments - develop a rescue plan - eat more whole grains, fruits and vegetables, lean meats and healthy fats - follow rescue plan if symptoms flare-up - know when to call  the doctor - track symptoms and what helps feel better or worse    Why is this important?   You will be able to handle your symptoms better if you keep track of them.  Making some simple changes to your lifestyle will help.  Eating healthy is one thing you can do to take good care of yourself.    Notes: Patient has developed an action plan with her cardiologist, she follows the [facility] CHF zones and action plans. Patient weighs herself and records the values daily.  Updated:05/27/20 Updated 07/28/20: Patient continues to monitor her weight daily and record her values. She is adhering to a low sodium diet and a fluid restriction of up to 2000 cc daily. She is following the rescue plan she has with her cardiologist and reports not having to take any additional lasix in several  weeks.       Plan: RN Health Coach will send patient a calendar booklet and constipation education, will  call patient within the month of April. Follow-up:  Patient agrees to Care Plan and Follow-up.  Blanchie Serve RN, BSN [facility] Care Management  RN Health Coach [phone] [email or web address]

## 2020-07-29 ENCOUNTER — Telehealth: Payer: Self-pay

## 2020-07-29 NOTE — Chronic Care Management (AMB) (Signed)
Chronic Care Management Pharmacy Assistant   Name: Kayla Brown  MRN: [id number] DOB: 08/20/1950  Reason for Encounter: Disease State/ Diabetes Adherence Call  Patient Questions:  1.  Have you seen any other providers since your last visit? Yes, 07/04/2020 [facility] Pulmonology Kayla Budge, MD, 07/21/2020 [facility] Endocrinology Brown, Kayla Dolores, MD  2.  Any changes in your medicines or health? No, patient states she has not had any changes in her medicines or health.   PCP : Kayla Hatch, MD  Allergies:  No Known Allergies  Medications: Outpatient Encounter Medications as of 07/29/2020  Medication Sig  . albuterol (VENTOLIN HFA) 108 (90 Base) MCG/ACT inhaler Inhale 1-2 puffs into the lungs every 6 (six) hours as needed for wheezing or shortness of breath.  . Alcohol Swabs (ALCOHOL PREP) PADS Pt uses an alcohol pad each time sugars are tested. Pt tests twice daily. Dx E11.9  . amLODipine (NORVASC) 10 MG tablet TAKE 1 TABLET BY MOUTH EVERY DAY  . atorvastatin (LIPITOR) 40 MG tablet TAKE 1 TABLET BY MOUTH EVERY DAY  . carvedilol (COREG) 25 MG tablet Take 1 tablet (25 mg total) by mouth 2 (two) times daily.  . Cholecalciferol (VITAMIN D PO) Take 2,000 Units by mouth daily.   . dapagliflozin propanediol (FARXIGA) 10 MG TABS tablet Take 1 tablet (10 mg total) by mouth daily.  . fish oil-omega-3 fatty acids 1000 MG capsule Take 1 g by mouth daily.  . fluticasone (FLONASE) 50 MCG/ACT nasal spray SPRAY 2 SPRAYS INTO EACH NOSTRIL EVERY DAY (Patient taking differently: Place 2 sprays into both nostrils daily.)  . furosemide (LASIX) 20 MG tablet TAKE 1 TABLET BY MOUTH EVERY DAY  . gabapentin (NEURONTIN) 100 MG capsule Take 100 mg by mouth 3 (three) times daily.  [name] glipiZIDE (GLUCOTROL) 5 MG tablet Take 1 tablet (5 mg total) by mouth 2 (two) times daily before a meal.  . glucose blood (FREESTYLE LITE) test strip USE TO TEST BLOOD GLUCOSE 2 TIMES DAILY. Dx E11.9  . loratadine (CLARITIN)  10 MG tablet TAKE 1 TABLET BY MOUTH EVERY DAY (Patient taking differently: Take 10 mg by mouth daily.)  . Melatonin 3 MG TABS Take 10 mg by mouth at bedtime.   . metFORMIN (GLUCOPHAGE-XR) 500 MG 24 hr tablet Take 1 tablet (500 mg total) by mouth in the morning and at bedtime.  . methocarbamol (ROBAXIN) 500 MG tablet Take 500 mg by mouth 2 (two) times daily.  . methylPREDNISolone (MEDROL DOSEPAK) 4 MG TBPK tablet Take by mouth.  . Multiple Vitamins-Minerals (VISION FORMULA) TABS Take 1 tablet by mouth daily.  [name] OVER THE COUNTER MEDICATION Take 1 tablet by mouth daily. Equate Vision Formula 50+  . sertraline (ZOLOFT) 50 MG tablet Take 1 tablet (50 mg total) by mouth daily.  . traMADol (ULTRAM) 50 MG tablet Take 1 tablet (50 mg total) by mouth every 6 (six) hours as needed.  . traMADol (ULTRAM) 50 MG tablet Take 1-2 tablets (50-100 mg total) by mouth daily as needed.  . TRUEPLUS LANCETS 30G MISC Use one lancet each time sugars are tested. Pt tests twice daily. Dx. E11.9  . UNABLE TO FIND Med Name: Healthwise talking meter   No facility-administered encounter medications on file as of 07/29/2020.    Current Diagnosis: Patient Active Problem List   Diagnosis Date Noted  . Type 2 diabetes mellitus with stage 3a chronic kidney disease, without long-term current use of insulin (HCC) 07/21/2020  . Type 2 diabetes mellitus  with hyperglycemia, with long-term current use of insulin (HCC) 04/19/2020  . Left hip pain 12/01/2019  . Type 2 diabetes mellitus with stage 3b chronic kidney disease, without long-term current use of insulin (HCC) 04/03/2019  . Diastolic CHF (HCC) 03/18/2019  . Dyspnea 03/08/2019  . Pulmonary edema 03/04/2019  . Physical exam 02/06/2017  . Anxiety and depression 12/05/2016  . Obesity (BMI 30-39.9) 03/01/2016  . OSA (obstructive sleep apnea) 07/11/2015  . Pulmonary hypertension (HCC) 05/06/2015  . Heart murmur 03/03/2015  . Essential hypertension, benign 03/18/2014  .  Hyperlipidemia 03/18/2014  . Post-menopause on HRT (hormone replacement therapy) 03/18/2014    Recent Relevant Labs: Lab Results  Component Value Date/Time   HGBA1C 6.7 (A) 07/21/2020 09:44 AM   HGBA1C 7.2 (A) 03/04/2020 01:39 PM   HGBA1C 7.3 (H) 11/19/2019 02:50 PM   HGBA1C 8.0 (H) 03/06/2019 04:40 AM   HGBA1C 6.7 02/06/2016 12:00 AM   MICROALBUR <0.7 05/23/2020 02:31 PM   MICROALBUR 4.7 (H) 05/25/2019 01:52 PM    Kidney Function Lab Results  Component Value Date/Time   CREATININE 1.26 (H) 07/21/2020 10:09 AM   CREATININE 1.24 (H) 05/23/2020 02:31 PM   CREATININE 0.97 03/30/2015 01:48 PM   GFR 43.92 (L) 07/21/2020 10:09 AM   GFRNONAA 48 (L) 11/09/2019 02:33 PM   GFRAA 56 (L) 11/09/2019 02:33 PM    . Current antihyperglycemic regimen:  o Farxiga 10 mg daily o Metformin XR 500 mg daily  . What recent interventions/DTPs have been made to improve glycemic control:  o Patient states she is currently taking her medications as prescribed.  . Have there been any recent hospitalizations or ED visits since last visit with CPP? No , patient has not had any recent hospitalizations or ED visits since her last visit with Kayla Brown, CPP.  [name] Patient denies hypoglycemic symptoms, including Pale, Sweaty, Shaky, Hungry, Nervous/irritable and Vision changes   . Patient denies hyperglycemic symptoms, including blurry vision, excessive thirst, fatigue, polyuria and weakness   . How often are you checking your blood sugar? once daily   . What are your blood sugars ranging?  o Fasting: 120, 135, 114, 180 o Before meals: n/a o After meals: n/a o Bedtime: n/a  . During the week, how often does your blood glucose drop below 70? Never   . Are you checking your feet daily/regularly? Patient states she does check her feet regularly. Patient states she makes sure she wears socks at all time.  Adherence Review: Is the patient currently on a STATIN medication? No Is the patient currently on  ACE/ARB medication? No Does the patient have >5 day gap between last estimated fill dates? No  Patient scheduled her follow up telephone appointment with Kayla Brown, CPP on Thursday 08/25/2020 at 9:00 am.  April D Calhoun, [facility] Clinical Pharmacist Assistant [phone]   Follow-Up:  Pharmacist Review

## 2020-08-01 DIAGNOSIS — H5213 Myopia, bilateral: Secondary | ICD-10-CM | POA: Diagnosis not present

## 2020-08-01 DIAGNOSIS — E119 Type 2 diabetes mellitus without complications: Secondary | ICD-10-CM | POA: Diagnosis not present

## 2020-08-01 LAB — HM DIABETES EYE EXAM

## 2020-08-05 ENCOUNTER — Other Ambulatory Visit: Payer: Self-pay | Admitting: *Deleted

## 2020-08-05 NOTE — Patient Outreach (Addendum)
[facility]) Care Management  08/05/2020  Kayla Brown 05-24-1951 [id number]  Nurse will close case due to patient being transitioned to embedded CM.  Plan: Nurse Health Coach will close case.  Blanchie Serve RN, Mining engineer [facility] [phone] [email or web address]

## 2020-08-08 ENCOUNTER — Telehealth: Payer: Self-pay

## 2020-08-08 NOTE — Telephone Encounter (Signed)
  Chronic Care Management   Outreach Note  08/08/2020 Name: Tyjah Hai MRN: [id number] DOB: Nov 15, 1950  Referred by: Sheliah Hatch, MD Reason for referral : Chronic Care Management (RNCM: Initial Outreach Chronic Care Management and coordination needs-Transition from Central team )   An unsuccessful telephone outreach was attempted today. The patient was referred to the case management team for assistance with care management and care coordination.     Follow Up Plan: A HIPAA compliant phone message was left for the patient providing contact information and requesting a return call.  The care management team will reach out to the patient again over the next 10 days.   Dudley Major RN, BSN,CCM, CDE Care Management Coordinator [facility] [phone], Mobile [phone]

## 2020-08-10 ENCOUNTER — Telehealth: Payer: Self-pay

## 2020-08-10 ENCOUNTER — Ambulatory Visit (INDEPENDENT_AMBULATORY_CARE_PROVIDER_SITE_OTHER): Payer: Medicare Other

## 2020-08-10 DIAGNOSIS — I5032 Chronic diastolic (congestive) heart failure: Secondary | ICD-10-CM

## 2020-08-10 DIAGNOSIS — E1122 Type 2 diabetes mellitus with diabetic chronic kidney disease: Secondary | ICD-10-CM

## 2020-08-10 DIAGNOSIS — N1832 Chronic kidney disease, stage 3b: Secondary | ICD-10-CM | POA: Diagnosis not present

## 2020-08-10 NOTE — Chronic Care Management (AMB) (Signed)
Chronic Care Management   CCM RN Visit Note  08/10/2020 Name: Maricela Schreur MRN: [id number] DOB: 07-07-1950  Subjective: Chanah Tidmore is a 70 y.o. year old female who is a primary care patient of Birdie Riddle, [name], MD. The care management team was consulted for assistance with disease management and care coordination needs.    Engaged with patient by telephone for initial visit in response to provider referral for case management and/or care coordination services.   Consent to Services:  The patient was given information about Chronic Care Management services, agreed to services, and gave verbal consent prior to initiation of services.  Please see initial visit note for detailed documentation.   Patient agreed to services and verbal consent obtained.   Assessment: Review of patient past medical history, allergies, medications, health status, including review of consultants reports, laboratory and other test data, was performed as part of comprehensive evaluation and provision of chronic care management services.   SDOH (Social Determinants of Health) assessments and interventions performed:  SDOH Interventions   Flowsheet Row Most Recent Value  SDOH Interventions   Food Insecurity Interventions Intervention Not Indicated  Housing Interventions Intervention Not Indicated  Intimate Partner Violence Interventions Intervention Not Indicated  Physical Activity Interventions Intervention Not Indicated  Stress Interventions Intervention Not Indicated  Transportation Interventions Intervention Not Indicated       CCM Care Plan  No Known Allergies  Outpatient Encounter Medications as of 08/10/2020  Medication Sig  . albuterol (VENTOLIN HFA) 108 (90 Base) MCG/ACT inhaler Inhale 1-2 puffs into the lungs every 6 (six) hours as needed for wheezing or shortness of breath.  . Alcohol Swabs (ALCOHOL PREP) PADS Pt uses an alcohol pad each time sugars are tested. Pt tests twice daily. Dx E11.9   . amLODipine (NORVASC) 10 MG tablet TAKE 1 TABLET BY MOUTH EVERY DAY  . atorvastatin (LIPITOR) 40 MG tablet TAKE 1 TABLET BY MOUTH EVERY DAY  . carvedilol (COREG) 25 MG tablet Take 1 tablet (25 mg total) by mouth 2 (two) times daily.  . Cholecalciferol (VITAMIN D PO) Take 2,000 Units by mouth daily.   . dapagliflozin propanediol (FARXIGA) 10 MG TABS tablet Take 1 tablet (10 mg total) by mouth daily.  . fish oil-omega-3 fatty acids 1000 MG capsule Take 1 g by mouth daily.  . fluticasone (FLONASE) 50 MCG/ACT nasal spray SPRAY 2 SPRAYS INTO EACH NOSTRIL EVERY DAY (Patient taking differently: Place 2 sprays into both nostrils daily.)  . furosemide (LASIX) 20 MG tablet TAKE 1 TABLET BY MOUTH EVERY DAY  . gabapentin (NEURONTIN) 100 MG capsule Take 100 mg by mouth 3 (three) times daily.  [name] glipiZIDE (GLUCOTROL) 5 MG tablet Take 1 tablet (5 mg total) by mouth 2 (two) times daily before a meal.  . glucose blood (FREESTYLE LITE) test strip USE TO TEST BLOOD GLUCOSE 2 TIMES DAILY. Dx E11.9  . loratadine (CLARITIN) 10 MG tablet TAKE 1 TABLET BY MOUTH EVERY DAY (Patient taking differently: Take 10 mg by mouth daily.)  . Melatonin 3 MG TABS Take 10 mg by mouth at bedtime.   . metFORMIN (GLUCOPHAGE-XR) 500 MG 24 hr tablet Take 1 tablet (500 mg total) by mouth in the morning and at bedtime.  . methocarbamol (ROBAXIN) 500 MG tablet Take 500 mg by mouth 2 (two) times daily.  . methylPREDNISolone (MEDROL DOSEPAK) 4 MG TBPK tablet Take by mouth.  . Multiple Vitamins-Minerals (VISION FORMULA) TABS Take 1 tablet by mouth daily.  [name] OVER THE COUNTER MEDICATION Take 1  tablet by mouth daily. Equate Vision Formula 50+  . sertraline (ZOLOFT) 50 MG tablet Take 1 tablet (50 mg total) by mouth daily.  . traMADol (ULTRAM) 50 MG tablet Take 1 tablet (50 mg total) by mouth every 6 (six) hours as needed.  . traMADol (ULTRAM) 50 MG tablet Take 1-2 tablets (50-100 mg total) by mouth daily as needed.  . TRUEPLUS LANCETS 30G MISC  Use one lancet each time sugars are tested. Pt tests twice daily. Dx. E11.9  . UNABLE TO FIND Med Name: [employer] talking meter   No facility-administered encounter medications on file as of 08/10/2020.    Patient Active Problem List   Diagnosis Date Noted  . Type 2 diabetes mellitus with stage 3a chronic kidney disease, without long-term current use of insulin ([place]) 07/21/2020  . Type 2 diabetes mellitus with hyperglycemia, with long-term current use of insulin ([place]) 04/19/2020  . Left hip pain 12/01/2019  . Type 2 diabetes mellitus with stage 3b chronic kidney disease, without long-term current use of insulin ([place]) 04/03/2019  . Diastolic CHF ([place]) 69/67/8938  . Dyspnea 03/08/2019  . Pulmonary edema 03/04/2019  . Physical exam 02/06/2017  . Anxiety and depression 12/05/2016  . Obesity (BMI 30-39.9) 03/01/2016  . OSA (obstructive sleep apnea) 07/11/2015  . Pulmonary hypertension ([place]) 05/06/2015  . Heart murmur 03/03/2015  . Essential hypertension, benign 03/18/2014  . Hyperlipidemia 03/18/2014  . Post-menopause on HRT (hormone replacement therapy) 03/18/2014    Conditions to be addressed/monitored:CHF, DMII and CKD Stage 3  Care Plan : RNCM:Chronic Kidney (Adult)  Updates made by [name], RN since 08/10/2020 12:00 AM    Problem: RNCM: Need for long term self management plan for chronic kidney disease   Priority: Medium    Long-Range Goal: RNCM : Disease Progression Prevented or Minimized   Start Date: 08/10/2020  Expected End Date: 12/22/2020  This Visit's Progress: On track  Priority: Medium  Note:   Current Barriers:  [name] Knowledge Deficits related to Chronic kidney disease stage 3  now being followed by nephrology . Does not adhere to provider recommendations re: diet and fluids recommendations at times  Nurse Case Manager Clinical Goal(s):  . patient will verbalize understanding of plan for self management of chronic kidney disease . patient will work with  CCM team and primary care provider to address needs related to chronic kidney disease . patient will attend all scheduled medical appointments: Dr. Birdie Riddle 11/18/20, Dr. Kelton Pillar 01/18/21 . patient will verbalize basic understanding of chronic kidney disease process and self health management plan as evidenced by no worsening of kidney function, adherence to diet and fluid recommendations  . patient will work with CM team pharmacist to self manage medications . the patient will demonstrate ongoing self health care management ability as evidenced by adherence to plan of care, medications, diet and provider appts, call providers with problems or concerns*  Interventions:  . 1:1 collaboration with [name], MD regarding development and update of comprehensive plan of care as evidenced by provider attestation and co-signature . Inter-disciplinary care team collaboration (see longitudinal plan of care) . Evaluation of current treatment plan related to chronic kidney disease and patient's adherence to plan as established by provider. . Provided education to patient re: chronic kidney disease self management . Reviewed medications with patient and discussed to call CCM team for any problems or concerns . Provided patient with written educational materials related to chronic kidney disease . Reviewed scheduled/upcoming provider appointments including: Dr. Birdie Riddle 11/18/20,  Dr. Kelton Pillar 01/18/21 . Discussed plans with patient for ongoing care management follow up and provided patient with direct contact information for care management team  Patient Goals/Self-Care Activities Over the next 120 days, patient will:  - Patient will self administer medications as prescribed Patient will attend all scheduled provider appointments Patient will call pharmacy for medication refills Patient will call provider office for new concerns or questions walk 3-4 times a week and gradually increase time and  number of days  -follow diet and fluid recommendations from kidney doctor  Follow Up Plan: Telephone follow up appointment with care management team member scheduled for: 09/14/20       Care Plan : RNCM:Heart Failure (Adult)  Updates made by [name], RN since 08/10/2020 12:00 AM    Problem: RNCM: Knowledge deficit related to long term self management of heart failure   Priority: High    Long-Range Goal: Effective self mangement of heart failure   Start Date: 08/10/2020  Expected End Date: 12/22/2020  This Visit's Progress: On track  Priority: High  Note:   Current Barriers:  . Does not adhere to provider recommendations re: diet and fluid recommendations at times Case Manager Clinical Goal(s):  . patient will weigh self daily and record . patient will verbalize understanding of Heart Failure Action Plan and when to call doctor . patient will take all Heart Failure mediations as prescribed . patient will weigh daily and record (notifying MD of 3 lb weight gain over night or 5 lb in a week) Interventions:  . Collaboration with [name], MD regarding development and update of comprehensive plan of care as evidenced by provider attestation and co-signature . Inter-disciplinary care team collaboration (see longitudinal plan of care) . Provided verbal education on low sodium diet . Reviewed Heart Failure Action Plan in depth and provided written copy . Discussed importance of daily weight and advised patient to weigh and record daily . Reviewed role of diuretics in prevention of fluid overload and management of heart failure Patient Goals/Self-Care Activities - begin a heart failure diary - bring diary to all appointments - develop a rescue plan - eat more whole grains, fruits and vegetables, lean meats and healthy fats - follow rescue plan if symptoms flare-up - know when to call the doctor - track symptoms and what helps feel better or worse Follow Up Plan:  Telephone follow up appointment with care management team member scheduled for: 09/14/20   Care Plan : RNCM:Diabetes Type 2 (Adult)  Updates made by [name], RN since 08/10/2020 12:00 AM    Problem: RNCM: Need for effective self management of Type 2 Diabetes   Priority: Medium    Long-Range Goal: RNCM:Effective self mangement of Type 2 Diabetes   Start Date: 08/10/2020  Expected End Date: 12/22/2020  This Visit's Progress: On track  Priority: Medium  Note:   Objective:  Lab Results  Component Value Date   HGBA1C 6.7 (A) 07/21/2020 .   Lab Results  Component Value Date   CREATININE 1.26 (H) 07/21/2020   CREATININE 1.24 (H) 05/23/2020   CREATININE 1.08 11/19/2019 .   [name] No results found for: EGFR Current Barriers:  [name] Knowledge Deficits related to basic Diabetes pathophysiology and self care/management . Difficulty obtaining or cannot afford medications reports currently getting patient assistance for Farxiga . Does not adhere to provider recommendations re: ADA/low sodium diet and exercise at times Case Manager Clinical Goal(s):  . patient will demonstrate improved adherence to prescribed treatment  plan for diabetes self care/management as evidenced by: daily monitoring and recording of CBG  adherence to ADA/ carb modified diet exercise 3-4 days/week adherence to prescribed medication regimen contacting provider for new or worsened symptoms or questions Interventions:  . Collaboration with [name], MD regarding development and update of comprehensive plan of care as evidenced by provider attestation and co-signature . Inter-disciplinary care team collaboration (see longitudinal plan of care) . Provided education to patient about basic DM disease process . Reviewed medications with patient and discussed importance of medication adherence . Discussed plans with patient for ongoing care management follow up and provided patient with direct contact information for  care management team . Provided patient with written educational materials related to hypo and hyperglycemia and importance of correct treatment . Reviewed scheduled/upcoming provider appointments including: Dr. Birdie Riddle 11/18/20, Dr. Kelton Pillar 01/18/21 . Advised patient, providing education and rationale, to check cbg daily and record, calling primary care provider for findings outside established parameters.   . Referral made to pharmacy team for assistance with medication issues and pharmacy assistance programs . Review of patient status, including review of consultants reports, relevant laboratory and other test results, and medications completed. Patient Goals/Self-Care Activities - Self administers oral medications as prescribed Attends all scheduled provider appointments Checks blood sugars as prescribed and utilize hyper and hypoglycemia protocol as needed Adheres to prescribed ADA/carb modified - check blood sugar at prescribed times - check blood sugar if I feel it is too high or too low - enter blood sugar readings and medication or insulin into daily log - take the blood sugar log to all doctor visits Follow Up Plan: Telephone follow up appointment with care management team member scheduled for: 09/14/20 at 9 AM     Plan:Telephone follow up appointment with care management team member scheduled for:  09/14/20 at 9 AM Peter Garter RN, [facility], CDE Care Management Coordinator [place] [phone], Mobile [phone]

## 2020-08-10 NOTE — Patient Instructions (Signed)
Visit Information  PATIENT GOALS:  Goals Addressed            This Visit's Progress   . RNCM Monitor and Manage My Diabetes Type 2 and chronic kidney disease       Timeframe:  Long-Range Goal Priority:  Medium Start Date:    08/10/20                         Expected End Date:     12/22/20                  Follow Up Date 09/14/2020 at 9 AM    - check blood sugar at prescribed times - check blood sugar if I feel it is too high or too low - enter blood sugar readings and medication or insulin into daily log - take the blood sugar log to all doctor visits  -walk 3-4 times a week and gradually increase time and number of days  -follow diet and fluid recommendations from kidney doctor   Why is this important?    Checking your blood sugar at home helps to keep it from getting very high or very low.   Writing the results in a diary or log helps the doctor know how to care for you.   Your blood sugar log should have the time, date and the results.   Also, write down the amount of insulin or other medicine that you take.   Other information, like what you ate, exercise done and how you were feeling, will also be helpful.     Notes:     . RNCM:Better track and self manage heart failure symptoms       Timeframe:  Long-Range Goal Priority:  High Start Date:  08/10/20                           Expected End Date: 12/22/20                      Follow Up Date 09/14/20 at 9 AM   - begin a heart failure diary - bring diary to all appointments - develop a rescue plan - eat more whole grains, fruits and vegetables, lean meats and healthy fats - follow rescue plan if symptoms flare-up - know when to call the doctor - track symptoms and what helps feel better or worse    Why is this important?   You will be able to handle your symptoms better if you keep track of them.  Making some simple changes to your lifestyle will help.  Eating healthy is one thing you can do to take good care of  yourself.         Chronic Kidney Disease, Adult Chronic kidney disease is when lasting damage happens to the kidneys slowly over a long time. The kidneys help to:  Make pee (urine).  Make hormones.  Keep the right amount of fluids and chemicals in the body. Most often, this disease does not go away. You must take steps to help keep the kidney damage from getting worse. If steps are not taken, the kidneys might stop working forever. What are the causes?  Diabetes.  High blood pressure.  Diseases that affect the heart and blood vessels.  Other kidney diseases.  Diseases of the body's disease-fighting system.  A problem with the flow of pee.  Infections of the organs that  make pee, store it, and take it out of the body.  Swelling or irritation of your blood vessels. What increases the risk?  Getting older.  Having someone in your family who has kidney disease or kidney failure.  Having a disease caused by genes.  Taking medicines often that harm the kidneys.  Being near or having contact with harmful substances.  Being very overweight.  Using tobacco now or in the past. What are the signs or symptoms?  Feeling very tired.  Having a swollen face, legs, ankles, or feet.  Feeling like you may vomit or vomiting.  Not feeling hungry.  Being confused or not able to focus.  Twitches and cramps in the leg muscles or other muscles.  Dry, itchy skin.  A taste of metal in your mouth.  Making less pee, or making more pee.  Shortness of breath.  Trouble sleeping. You may also become anemic or get weak bones. Anemic means there is not enough red blood cells or hemoglobin in your blood. You may get symptoms slowly. You may not notice them until the kidney damage gets very bad. How is this treated? Often, there is no cure for this disease. Treatment can help with symptoms and help keep the disease from getting worse. You may need to:  Avoid alcohol.  Avoid  foods that are high in salt, potassium, phosphorous, and protein.  Take medicines for symptoms and to help control other conditions.  Have dialysis. This treatment gets harmful waste out of your body.  Treat other problems that cause your kidney disease or make it worse. Follow these instructions at home: Medicines  Take over-the-counter and prescription medicines only as told by your doctor.  Do not take any new medicines, vitamins, or supplements unless your doctor says it is okay. Lifestyle  Do not smoke or use any products that contain nicotine or tobacco. If you need help quitting, ask your doctor.  If you drink alcohol: ? Limit how much you use to:  0-1 drink a day for women who are not pregnant.  0-2 drinks a day for men. ? Know how much alcohol is in your drink. In the [place] one drink equals one 12 oz bottle of beer (355 mL), one 5 oz glass of wine (148 mL), or one 1 oz glass of hard liquor (44 mL).  Stay at a healthy weight. If you need help losing weight, ask your doctor.   General instructions  Follow instructions from your doctor about what you cannot eat or drink.  Track your blood pressure at home. Tell your doctor about any changes.  If you have diabetes, track your blood sugar.  Exercise at least 30 minutes a day, 5 days a week.  Keep your shots (vaccinations) up to date.  Keep all follow-up visits.   Where to find more information  American Association of Kidney Patients: BombTimer.gl  National Kidney Foundation: www.kidney.[place]: https://mathis.com/  Life Options: www.lifeoptions.org  Kidney School: www.kidneyschool.org Contact a doctor if:  Your symptoms get worse.  You get new symptoms. Get help right away if:  You get symptoms of end-stage kidney disease. These include: ? Headaches. ? Losing feeling in your hands or feet. ? Easy bruising. ? Having hiccups often. ? Chest pain. ? Shortness of breath.  You have a  fever.  You make less pee than normal.  You have pain or you bleed when you pee or poop. These symptoms may be an emergency. Get help right away.  Call your local emergency services (911 in the [place]  Do not wait to see if the symptoms will go away.  Do not drive yourself to the hospital. Summary  Chronic kidney disease is when lasting damage happens to the kidneys slowly over a long time.  Causes of this disease include diabetes and high blood pressure.  Often, there is no cure for this disease. Treatment can help symptoms and help keep the disease from getting worse.  Treatment may involve lifestyle changes, medicines, and dialysis. This information is not intended to replace advice given to you by your health care provider. Make sure you discuss any questions you have with your health care provider. Document Revised: 09/16/2019 Document Reviewed: 09/16/2019 Elsevier Patient Education  2021 [place].   Consent to CCM Services: Ms. Petion was given information about Chronic Care Management services today including:  1. CCM service includes personalized support from designated clinical staff supervised by her physician, including individualized plan of care and coordination with other care providers 2. 24/7 contact phone numbers for assistance for urgent and routine care needs. 3. Service will only be billed when office clinical staff spend 20 minutes or more in a month to coordinate care. 4. Only one practitioner may furnish and bill the service in a calendar month. 5. The patient may stop CCM services at any time (effective at the end of the month) by phone call to the office staff. 6. The patient will be responsible for cost sharing (co-pay) of up to 20% of the service fee (after annual deductible is met).  Patient agreed to services and verbal consent obtained.   Patient verbalizes understanding of instructions provided today and agrees to view in [place].   Telephone  follow up appointment with care management team member scheduled for: 09/14/20 at [place], [facility], CDE Care Management Coordinator [facility] [phone], Mobile [phone]  CLINICAL CARE PLAN: Patient Care Plan: RNCM:Chronic Kidney (Adult)    Problem Identified: RNCM: Need for long term self management plan for chronic kidney disease   Priority: Medium    Long-Range Goal: RNCM : Disease Progression Prevented or Minimized   Start Date: 08/10/2020  Expected End Date: 12/22/2020  This Visit's Progress: On track  Priority: Medium  Note:   Current Barriers:  [name] Knowledge Deficits related to Chronic kidney disease stage 3  now being followed by nephrology . Does not adhere to provider recommendations re: diet and fluids recommendations at times  Nurse Case Manager Clinical Goal(s):  . patient will verbalize understanding of plan for self management of chronic kidney disease . patient will work with CCM team and primary care provider to address needs related to chronic kidney disease . patient will attend all scheduled medical appointments: Dr. Birdie Riddle 11/18/20, Dr. Kelton Pillar 01/18/21 . patient will verbalize basic understanding of chronic kidney disease process and self health management plan as evidenced by no worsening of kidney function, adherence to diet and fluid recommendations  . patient will work with CM team pharmacist to self manage medications . the patient will demonstrate ongoing self health care management ability as evidenced by adherence to plan of care, medications, diet and provider appts, call providers with problems or concerns*  Interventions:  . 1:1 collaboration with [name], MD regarding development and update of comprehensive plan of care as evidenced by provider attestation and co-signature . Inter-disciplinary care team collaboration (see longitudinal plan of care) . Evaluation of current treatment plan related  to chronic kidney  disease and patient's adherence to plan as established by provider. . Provided education to patient re: chronic kidney disease self management . Reviewed medications with patient and discussed to call CCM team for any problems or concerns . Provided patient with written educational materials related to chronic kidney disease . Reviewed scheduled/upcoming provider appointments including: Dr. Birdie Riddle 11/18/20, Dr. Kelton Pillar 01/18/21 . Discussed plans with patient for ongoing care management follow up and provided patient with direct contact information for care management team  Patient Goals/Self-Care Activities Over the next 120 days, patient will:  - Patient will self administer medications as prescribed Patient will attend all scheduled provider appointments Patient will call pharmacy for medication refills Patient will call provider office for new concerns or questions walk 3-4 times a week and gradually increase time and number of days  -follow diet and fluid recommendations from kidney doctor  Follow Up Plan: Telephone follow up appointment with care management team member scheduled for: 09/14/20       Patient Care Plan: RNCM:Heart Failure (Adult)    Problem Identified: RNCM: Knowledge deficit related to long term self management of heart failure   Priority: High    Long-Range Goal: Effective self mangement of heart failure   Start Date: 08/10/2020  Expected End Date: 12/22/2020  This Visit's Progress: On track  Priority: High  Note:   Current Barriers:  . Does not adhere to provider recommendations re: diet and fluid recommendations at times Case Manager Clinical Goal(s):  . patient will weigh self daily and record . patient will verbalize understanding of Heart Failure Action Plan and when to call doctor . patient will take all Heart Failure mediations as prescribed . patient will weigh daily and record (notifying MD of 3 lb weight gain over night or 5 lb in a  week) Interventions:  . Collaboration with [name], MD regarding development and update of comprehensive plan of care as evidenced by provider attestation and co-signature . Inter-disciplinary care team collaboration (see longitudinal plan of care) . Provided verbal education on low sodium diet . Reviewed Heart Failure Action Plan in depth and provided written copy . Discussed importance of daily weight and advised patient to weigh and record daily . Reviewed role of diuretics in prevention of fluid overload and management of heart failure Patient Goals/Self-Care Activities - begin a heart failure diary - bring diary to all appointments - develop a rescue plan - eat more whole grains, fruits and vegetables, lean meats and healthy fats - follow rescue plan if symptoms flare-up - know when to call the doctor - track symptoms and what helps feel better or worse Follow Up Plan: Telephone follow up appointment with care management team member scheduled for: 09/14/20   Patient Care Plan: RNCM:Diabetes Type 2 (Adult)    Problem Identified: RNCM: Need for effective self management of Type 2 Diabetes   Priority: Medium    Long-Range Goal: RNCM:Effective self mangement of Type 2 Diabetes   Start Date: 08/10/2020  Expected End Date: 12/22/2020  This Visit's Progress: On track  Priority: Medium  Note:   Objective:  Lab Results  Component Value Date   HGBA1C 6.7 (A) 07/21/2020 .   Lab Results  Component Value Date   CREATININE 1.26 (H) 07/21/2020   CREATININE 1.24 (H) 05/23/2020   CREATININE 1.08 11/19/2019 .   [name] No results found for: EGFR Current Barriers:  [name] Knowledge Deficits related to basic Diabetes pathophysiology and self care/management . Difficulty obtaining or cannot afford medications reports  currently getting patient assistance for [place] . Does not adhere to provider recommendations re: ADA/low sodium diet and exercise at times Case Manager Clinical Goal(s):   . patient will demonstrate improved adherence to prescribed treatment plan for diabetes self care/management as evidenced by: daily monitoring and recording of CBG  adherence to ADA/ carb modified diet exercise 3-4 days/week adherence to prescribed medication regimen contacting provider for new or worsened symptoms or questions Interventions:  . Collaboration with [name], MD regarding development and update of comprehensive plan of care as evidenced by provider attestation and co-signature . Inter-disciplinary care team collaboration (see longitudinal plan of care) . Provided education to patient about basic DM disease process . Reviewed medications with patient and discussed importance of medication adherence . Discussed plans with patient for ongoing care management follow up and provided patient with direct contact information for care management team . Provided patient with written educational materials related to hypo and hyperglycemia and importance of correct treatment . Reviewed scheduled/upcoming provider appointments including: Dr. Birdie Riddle 11/18/20, Dr. Kelton Pillar 01/18/21 . Advised patient, providing education and rationale, to check cbg daily and record, calling primary care provider for findings outside established parameters.   . Referral made to pharmacy team for assistance with medication issues and pharmacy assistance programs . Review of patient status, including review of consultants reports, relevant laboratory and other test results, and medications completed. Patient Goals/Self-Care Activities - Self administers oral medications as prescribed Attends all scheduled provider appointments Checks blood sugars as prescribed and utilize hyper and hypoglycemia protocol as needed Adheres to prescribed ADA/carb modified - check blood sugar at prescribed times - check blood sugar if I feel it is too high or too low - enter blood sugar readings and medication or insulin into  daily log - take the blood sugar log to all doctor visits Follow Up Plan: Telephone follow up appointment with care management team member scheduled for: 09/14/20 at 9 AM

## 2020-08-10 NOTE — Telephone Encounter (Signed)
  Chronic Care Management   Outreach Note  08/10/2020 Name: Kayla Brown MRN: [id number] DOB: 08/26/1950  Referred by: Sheliah Hatch, MD Reason for referral : Chronic Care Management (RNCM: Initial Outreach Chronic Care Management and coordination needs, Transition to Chronic Care Management)   A second unsuccessful telephone outreach was attempted today. The patient was referred to the case management team for assistance with care management and care coordination.     Follow Up Plan: A HIPAA compliant phone message was left for the patient providing contact information and requesting a return call.  The care management team will reach out to the patient again over the next 7-10 days.   Dudley Major RN, BSN,CCM, CDE Care Management Coordinator [facility] [phone], Mobile [phone]

## 2020-08-12 ENCOUNTER — Telehealth: Payer: Self-pay | Admitting: Internal Medicine

## 2020-08-12 MED ORDER — DAPAGLIFLOZIN PROPANEDIOL 10 MG PO TABS: 10.0000 mg | ORAL_TABLET | Freq: Every day | ORAL | 5 refills | Status: DC

## 2020-08-12 NOTE — Telephone Encounter (Signed)
FYI  Received a fax from [facility] stating patient wanted a Rx for [place].  However, I have called patient to verify the reason for this request. So there was a misunderstanding, patient was talking about [place] not [place].  Patient stated that she have trouble getting the [place] through [employer] patient assistant. I have asked patient to contact them at the [phone] to check on her shipment. So in the meantime, her husband asked for a coupon to see if it will work at [facility]. I told them I will place coupon in front office and they can see if it will work.  Patient stated that if still having trouble she would like to change medication.

## 2020-08-15 ENCOUNTER — Encounter: Payer: Self-pay | Admitting: Internal Medicine

## 2020-08-15 NOTE — Assessment & Plan Note (Signed)
Benefits from CPAP with good compliance and cocntrol Plan- [employer] to service machine for abnormal noise.

## 2020-08-15 NOTE — Assessment & Plan Note (Signed)
Followed by cardiology. Consider need to check overnight oximetry on CPAP

## 2020-08-18 NOTE — Telephone Encounter (Signed)
adherence review performed, no gaps/ontrack with atorvastatin, glipizide, metformin. Receiving Farxiga through patient assistance.  Recent labs/visits-  07/21/2020 OV (Dr Lonzo Cloud): a1c improved to 6.7 from 7.2  02/2020. GFR consistently in CKD IIIb range - metformin reduced to 1000mg /day (follows with Dr now). advised to test FBG once daily, 6 month f/u.   -  Continue Glipizide 5 mg, 1 tablet Before breakfast and 1 tablet before supper  - Continue Metformin 500 mg , 2 tablets with Breakfast and 2 tablets before Supper - Continue Farxiga 10 mg, 1 tablet daily with Breakfast   Followed by Dr Signe Colt now.

## 2020-08-24 NOTE — Progress Notes (Signed)
Chronic Care Management Pharmacy Note  08/25/2020 Name:  Kayla Brown MRN:  [id number] DOB:  [birth year]  Subjective: Kayla Brown is an 70 y.o. year old female who is a primary patient of [name], [name], MD.  The CCM team was consulted for assistance with disease management and care coordination needs.   Engaged with patient by telephone for follow up visit in response to provider referral for pharmacy case management and/or care coordination services.   Consent to Services:  The patient was given information about Chronic Care Management services, agreed to services, and gave verbal consent prior to initiation of services.  Please see initial visit note for detailed documentation.  Patient Care Team: [name], MD as PCP - General (Family Medicine) [name], MD (Inactive) as Consulting Physician (Gastroenterology) [name], MD as Consulting Physician (Gynecology) [name], [facility] as Pharmacist (Pharmacist) [name], RN as Case Manager  Objective: Lab Results  Component Value Date   CREATININE 1.26 (H) 07/21/2020   BUN 39 (H) 07/21/2020   GFR 43.92 (L) 07/21/2020   GFRNONAA 48 (L) 11/09/2019   GFRAA 56 (L) 11/09/2019   NA 140 07/21/2020   K 4.5 07/21/2020   CALCIUM 10.3 07/21/2020   CO2 27 07/21/2020   Lab Results  Component Value Date/Time   HGBA1C 6.7 (A) 07/21/2020 09:44 AM   HGBA1C 7.2 (A) 03/04/2020 01:39 PM   HGBA1C 7.3 (H) 11/19/2019 02:50 PM   HGBA1C 8.0 (H) 03/06/2019 04:40 AM   HGBA1C 6.7 02/06/2016 12:00 AM   GFR 43.92 (L) 07/21/2020 10:09 AM   GFR 44.83 (L) 05/23/2020 02:31 PM   MICROALBUR <0.7 05/23/2020 02:31 PM   MICROALBUR 4.7 (H) 05/25/2019 01:52 PM    Last diabetic Eye exam:  Lab Results  Component Value Date/Time   HMDIABEYEEXA No Retinopathy 08/01/2020 03:45 PM    Last diabetic Foot exam:  Lab Results  Component Value Date/Time   HMDIABFOOTEX completed 01/24/2016 12:00 AM    Lab Results  Component Value  Date   CHOL 115 05/23/2020   HDL 45.70 05/23/2020   LDLCALC 61 05/25/2019   LDLDIRECT 50.0 05/23/2020   TRIG 232.0 (H) 05/23/2020   CHOLHDL 3 05/23/2020   Hepatic Function Latest Ref Rng & Units 05/23/2020 11/19/2019 05/25/2019  Total Protein 6.0 - 8.3 g/dL 6.6 6.9 6.8  Albumin 3.5 - 5.2 g/dL 4.3 4.5 4.1  AST 0 - 37 U/L _0 ALT 0 - 35 U/L _1 Alk Phosphatase 39 - 117 U/L 61 64 62  Total Bilirubin 0.2 - 1.2 mg/dL 0.3 0.3 0.3  Bilirubin, Direct 0.0 - 0.3 mg/dL 0.1 0.1 0.1   Lab Results  Component Value Date/Time   TSH 1.32 05/23/2020 02:31 PM   TSH 1.40 11/19/2019 02:50 PM   CBC Latest Ref Rng & Units 05/23/2020 11/19/2019 05/25/2019  WBC 4.0 - 10.5 K/uL 10.0 10.4 11.4(H)  Hemoglobin 12.0 - 15.0 g/dL 13.0 12.7 12.6  Hematocrit 36.0 - 46.0 % 39.4 37.5 38.8  Platelets 150.0 - 400.0 K/uL 287.0 294.0 272.0   07/05/2020 US Renal Impression: Mildly increased echogenicity of renal parenchyma is noted bilaterally suggesting medical renal disease. No hydronephrosis or renal obstruction is noted.  No results found for: VD25OH  Clinical ASCVD: No  The ASCVD Risk score Mikey Bussing DC Jr., et al., 2013) failed to calculate for the following reasons:   The valid total cholesterol range is 130 to 320 mg/dL    Depression screen [facility] 2/9 08/10/2020  05/23/2020 04/01/2020  Decreased Interest 0 0 0  Down, Depressed, Hopeless 0 0 0  PHQ - 2 Score 0 0 0  Altered sleeping - 0 -  Tired, decreased energy - 0 -  Change in appetite - 0 -  Feeling bad or failure about yourself  - 0 -  Trouble concentrating - 0 -  Moving slowly or fidgety/restless - 0 -  Suicidal thoughts - 0 -  PHQ-9 Score - 0 -  Difficult doing work/chores - Not difficult at all -  Some recent data might be hidden    Social History   Tobacco Use  Smoking Status Never Smoker  Smokeless Tobacco Never Used   BP Readings from Last 3 Encounters:  07/21/20 130/82  07/04/20 134/60  06/02/20 (!) 137/57   Pulse Readings from  Last 3 Encounters:  07/21/20 60  07/04/20 (!) 59  06/02/20 (!) 55   Wt Readings from Last 3 Encounters:  07/21/20 175 lb (79.4 kg)  07/04/20 175 lb (79.4 kg)  06/02/20 176 lb (79.8 kg)   Assessment/Interventions: Review of patient past medical history, allergies, medications, health status, including review of consultants reports, laboratory and other test data, was performed as part of comprehensive evaluation and provision of chronic care management services.   SDOH:  (Social Determinants of Health) assessments and interventions performed: Yes  CCM Care Plan No Known Allergies Medications Reviewed Today    Reviewed by [name], [facility] (Pharmacist) on 08/25/20 at 0950  Med List Status: <None>  Medication Order Taking? Sig Documenting Provider Last Dose Status Informant  albuterol (VENTOLIN HFA) 108 (90 Base) MCG/ACT inhaler [id number] No Inhale 1-2 puffs into the lungs every 6 (six) hours as needed for wheezing or shortness of breath. [provider] Taking Active   Alcohol Swabs (ALCOHOL PREP) PADS [id number] No Pt uses an alcohol pad each time sugars are tested. Pt tests twice daily. Dx E11.9 [name], MD Taking Active Spouse/Significant Other  amLODipine (NORVASC) 10 MG tablet [id number]  TAKE 1 TABLET BY MOUTH EVERY DAY [name], MD  Active   atorvastatin (LIPITOR) 40 MG tablet [id number] No TAKE 1 TABLET BY MOUTH EVERY DAY [name], MD Taking Active   carvedilol (COREG) 25 MG tablet [id number] No Take 1 tablet (25 mg total) by mouth 2 (two) times daily. [name], [name], MD Taking Active   Cholecalciferol (VITAMIN D PO) [id number] No Take 2,000 Units by mouth daily.  [provider] Taking Active Spouse/Significant Other  dapagliflozin propanediol (FARXIGA) 10 MG TABS tablet [id number]  Take 1 tablet (10 mg total) by mouth daily. Shamleffer, Melanie Crazier, MD  Active   fish oil-omega-3 fatty acids 1000 MG capsule [id number] No Take 1 g by  mouth daily. [provider] Taking Active Spouse/Significant Other  fluticasone (FLONASE) 50 MCG/ACT nasal spray [id number] No SPRAY 2 SPRAYS INTO EACH NOSTRIL EVERY DAY  Patient taking differently: Place 2 sprays into both nostrils daily.   [name], MD Taking Active   furosemide (LASIX) 20 MG tablet [id number] No TAKE 1 TABLET BY MOUTH EVERY DAY [name], MD Taking Active   gabapentin (NEURONTIN) 100 MG capsule [id number] No Take 100 mg by mouth 3 (three) times daily. [provider] Taking Active   glipiZIDE (GLUCOTROL) 5 MG tablet [id number] No Take 1 tablet (5 mg total) by mouth 2 (two) times daily before a meal. Shamleffer, Melanie Crazier, MD Taking Active   glucose blood (FREESTYLE LITE) test strip [id number] No USE  TO TEST BLOOD GLUCOSE 2 TIMES DAILY. Dx E11.9 [name], MD Taking Active Spouse/Significant Other  loratadine (CLARITIN) 10 MG tablet [id number] No TAKE 1 TABLET BY MOUTH EVERY DAY  Patient taking differently: Take 10 mg by mouth daily.   [name], MD Taking Active   Melatonin 3 MG TABS [id number] No Take 10 mg by mouth at bedtime.  [provider] Taking Active Spouse/Significant Other  metFORMIN (GLUCOPHAGE-XR) 500 MG 24 hr tablet [id number]  Take 1 tablet (500 mg total) by mouth in the morning and at bedtime. Shamleffer, Melanie Crazier, MD  Active   methocarbamol (ROBAXIN) 500 MG tablet [id number] No Take 500 mg by mouth 2 (two) times daily. [provider] Taking Active   methylPREDNISolone (MEDROL DOSEPAK) 4 MG TBPK tablet [id number] No Take by mouth. [provider] Taking Active   Multiple Vitamins-Minerals (VISION FORMULA) TABS [id number] No Take 1 tablet by mouth daily. [provider] Taking Active Spouse/Significant Other  OVER THE COUNTER MEDICATION [id number] No Take 1 tablet by mouth daily. Equate Vision Formula 50+ [provider] Taking Active Spouse/Significant Other   sertraline (ZOLOFT) 50 MG tablet [id number] No Take 1 tablet (50 mg total) by mouth daily. [name], MD Taking Active   traMADol Veatrice Bourbon) 50 MG tablet [id number] No Take 1 tablet (50 mg total) by mouth every 6 (six) hours as needed. Aundra Dubin, PA-C Taking Active   traMADol (ULTRAM) 50 MG tablet [id number] No Take 1-2 tablets (50-100 mg total) by mouth daily as needed. Aundra Dubin, PA-C Taking Active   TRUEPLUS LANCETS 30G MISC [id number] No Use one lancet each time sugars are tested. Pt tests twice daily. Dx. E11.9 [name], MD Taking Active Spouse/Significant Other  UNABLE TO FIND [id number] No Med Name: [place] talking meter Shamleffer, Melanie Crazier, MD Taking Active          Patient Active Problem List   Diagnosis Date Noted  . Type 2 diabetes mellitus with stage 3a chronic kidney disease, without long-term current use of insulin ([place]) 07/21/2020  . Type 2 diabetes mellitus with hyperglycemia, with long-term current use of insulin ([place]) 04/19/2020  . Left hip pain 12/01/2019  . Type 2 diabetes mellitus with stage 3b chronic kidney disease, without long-term current use of insulin ([place]) 04/03/2019  . Diastolic CHF ([place]) 42/35/3614  . Dyspnea 03/08/2019  . Pulmonary edema 03/04/2019  . Physical exam 02/06/2017  . Anxiety and depression 12/05/2016  . Obesity (BMI 30-39.9) 03/01/2016  . OSA (obstructive sleep apnea) 07/11/2015  . Pulmonary hypertension ([place]) 05/06/2015  . Heart murmur 03/03/2015  . Essential hypertension, benign 03/18/2014  . Hyperlipidemia 03/18/2014  . Post-menopause on HRT (hormone replacement therapy) 03/18/2014   Immunization History  Administered Date(s) Administered  . Fluad Quad(high Dose 65+) 02/16/2019, 03/28/2020  . Influenza,inj,Quad PF,6+ Mos 04/24/2016, 03/04/2017, 03/20/2018  . PFIZER(Purple Top)SARS-COV-2 Vaccination 09/24/2019, 10/15/2019, 04/25/2020  . Pneumococcal Conjugate-13 07/06/2016  . Pneumococcal  Polysaccharide-23 07/01/2017  . Tdap 09/23/2009    Conditions to be addressed/monitored:  Hypertension and Diabetes Care Plan : [place]  Updates made by [name], [facility] since 08/25/2020 12:00 AM    Problem: CKD HLD HTN DM     Long-Range Goal: Disease Management   Start Date: 08/25/2020  Expected End Date: 08/25/2021  This Visit's Progress: On track  Priority: High  Note:   Medication Assistance: Farxigaobtained through [place].  Enrollment ends 05/2021 Patient's preferred pharmacy is:  [id number]  [place], [place] - [address] [place] [address] Phone: [phone] Fax: [phone]  Patient decided to: Continue current medication management strategy Patient agrees to Care Plan and Follow-up.  Current Barriers:  . Unable to independently afford treatment regimen  Pharmacist Clinical Goal(s):  [name] Over the next 365 days, patient will verbalize ability to afford treatment regimen . achieve adherence to monitoring guidelines and medication adherence to achieve therapeutic efficacy . contact provider office for questions/concerns as evidenced notation of same in electronic health record through collaboration with PharmD and provider.   Interventions: . 1:1 collaboration with [name], MD regarding development and update of comprehensive plan of care as evidenced by provider attestation and co-signature . Inter-disciplinary care team collaboration (see longitudinal plan of care) . Comprehensive medication review performed; medication list updated in electronic medical record  Hypertension (BP goal <130/80) -Controlled  -stage III CKD, DM II  -Current treatment: . Amlodipine 10 mg once daily (PCP) . Carvedilol 25 mg twice daily (Dr [name]) -Previously on RAASi therapy with spironolactone and losartan but was stopped after developing AKI.  -Current home readings: none provided, ~130/80  most recent OV -Current dietary habits: trying to limit sugar, pt states currently focused on reducing phosphorus intake  -Denies hypotensive/hypertensive symptoms -Educated on BP goals and benefits of medications for prevention of heart attack, stroke and kidney damage; Daily salt intake goal < 2300 mg; Exercise goal of 150 minutes per week; -Counseled to monitor BP at home, document, and provide log at future appointments -Counseled on diet and exercise extensively Recommended to continue current medication  Diabetes (A1c goal <7%) -Controlled -Current medications: . Farxiga 10 mg once daily . Metformin 500 mg twice daily . Glipizide 5 mg once daily  -Current home glucose readings  fasting glucose: 100s on avg per pt, ranging 90s-120s  -Denies hypoglycemic/hyperglycemic symptoms -Current meal patterns: see HTN -Current exercise: limited by back pain  -Educated onA1c and blood sugar goals; Complications of diabetes including kidney damage, retinal damage, and cardiovascular disease; Prevention and management of hypoglycemic episodes; Benefits of routine self-monitoring of blood sugar; -Counseled to check feet daily and get yearly eye exams -Counseled on diet and exercise extensively Recommended to continue current medication -Supported patient with medication refill for Farxiga through [place] patient assistance. [employer] stated 90 DS sent to patient 07/28/2020 and would have been delivered 08/05/2020 to front desk. They will issue replacement order as patient has already checked with front desk and states they did not receive medication (previous UPS tracking number [phone]). In the future delivery driver will attempt patient's appt before delivering to front desk. PAP representative will submit for approval of expedited shipping otherwise 90DS replacement order will take 7-12 days. Confirmed correct address with both patient and PAP. Pt informed.  -Refill line  [phone].  Patient Goals/Self-Care Activities . Over the next 365 days, patient will:  - take medications as prescribed collaborate with provider on medication access solutions engage in dietary modifications by reducing phosphorus intake, starting with soda.      Future Appointments  Date Time Provider [place]  09/14/2020  9:00 AM [facility]M [facility]  09/30/2020  9:00 AM [facility] PHARMACIST [facility]  11/18/2020  2:00 PM [name], MD [facility]  01/18/2021  8:10 AM Shamleffer, Melanie Crazier, MD [facility] None  07/04/2021  9:00 AM Deneise Lever, MD [facility] None   Follow-up plan with Care Management Team: . CPA: n/a . RPH: 1 month telephone  f/u visit. PAP. 1 refill remaining on Farxiga PAP. Will need to send just Rx part of PAP application to endocrinologist.   [name], Pharm.D., BCGP Clinical Pharmacist [employer] [facility] [place] [phone]

## 2020-08-25 ENCOUNTER — Ambulatory Visit (INDEPENDENT_AMBULATORY_CARE_PROVIDER_SITE_OTHER): Payer: Medicare Other

## 2020-08-25 DIAGNOSIS — E1122 Type 2 diabetes mellitus with diabetic chronic kidney disease: Secondary | ICD-10-CM

## 2020-08-25 DIAGNOSIS — I5032 Chronic diastolic (congestive) heart failure: Secondary | ICD-10-CM

## 2020-08-25 DIAGNOSIS — I10 Essential (primary) hypertension: Secondary | ICD-10-CM | POA: Diagnosis not present

## 2020-08-25 DIAGNOSIS — N1832 Chronic kidney disease, stage 3b: Secondary | ICD-10-CM

## 2020-08-25 NOTE — Patient Instructions (Signed)
Kayla Brown,  Thank you for taking the time to review your medications with me today.  I have included our care plan/goals in the following pages. Please review and call me at [phone] with any questions!  Thanks! Ellin Mayhew, Pharm.D., BCGP Clinical Pharmacist [facility] [place] [phone] Patient Care Plan: RNCM:Chronic Kidney (Adult)    Problem Identified: RNCM: Need for long term self management plan for chronic kidney disease   Priority: Medium    Long-Range Goal: RNCM : Disease Progression Prevented or Minimized   Start Date: 08/10/2020  Expected End Date: 12/22/2020  This Visit's Progress: On track  Priority: Medium  Note:   Current Barriers:  [name] Knowledge Deficits related to Chronic kidney disease stage 3  now being followed by nephrology . Does not adhere to provider recommendations re: diet and fluids recommendations at times  Nurse Case Manager Clinical Goal(s):  . patient will verbalize understanding of plan for self management of chronic kidney disease . patient will work with CCM team and primary care provider to address needs related to chronic kidney disease . patient will attend all scheduled medical appointments: Dr. Birdie Riddle 11/18/20, Dr. Kelton Pillar 01/18/21 . patient will verbalize basic understanding of chronic kidney disease process and self health management plan as evidenced by no worsening of kidney function, adherence to diet and fluid recommendations  . patient will work with CM team pharmacist to self manage medications . the patient will demonstrate ongoing self health care management ability as evidenced by adherence to plan of care, medications, diet and provider appts, call providers with problems or concerns*  Interventions:  . 1:1 collaboration with [name], MD regarding development and update of comprehensive plan of care as evidenced by provider attestation and  co-signature . Inter-disciplinary care team collaboration (see longitudinal plan of care) . Evaluation of current treatment plan related to chronic kidney disease and patient's adherence to plan as established by provider. . Provided education to patient re: chronic kidney disease self management . Reviewed medications with patient and discussed to call CCM team for any problems or concerns . Provided patient with written educational materials related to chronic kidney disease . Reviewed scheduled/upcoming provider appointments including: Dr. Birdie Riddle 11/18/20, Dr. Kelton Pillar 01/18/21 . Discussed plans with patient for ongoing care management follow up and provided patient with direct contact information for care management team  Patient Goals/Self-Care Activities Over the next 120 days, patient will:  - Patient will self administer medications as prescribed Patient will attend all scheduled provider appointments Patient will call pharmacy for medication refills Patient will call provider office for new concerns or questions walk 3-4 times a week and gradually increase time and number of days  -follow diet and fluid recommendations from kidney doctor  Follow Up Plan: Telephone follow up appointment with care management team member scheduled for: 09/14/20       Patient Care Plan: RNCM:Heart Failure (Adult)    Problem Identified: RNCM: Knowledge deficit related to long term self management of heart failure   Priority: High    Long-Range Goal: Effective self mangement of heart failure   Start Date: 08/10/2020  Expected End Date: 12/22/2020  This Visit's Progress: On track  Priority: High  Note:   Current Barriers:  . Does not adhere to provider recommendations re: diet and fluid recommendations at times Case Manager Clinical Goal(s):  . patient will weigh self daily and record . patient will verbalize understanding of Heart Failure Action  Plan and when to call doctor . patient will take  all Heart Failure mediations as prescribed . patient will weigh daily and record (notifying MD of 3 lb weight gain over night or 5 lb in a week) Interventions:  . Collaboration with [name], MD regarding development and update of comprehensive plan of care as evidenced by provider attestation and co-signature . Inter-disciplinary care team collaboration (see longitudinal plan of care) . Provided verbal education on low sodium diet . Reviewed Heart Failure Action Plan in depth and provided written copy . Discussed importance of daily weight and advised patient to weigh and record daily . Reviewed role of diuretics in prevention of fluid overload and management of heart failure Patient Goals/Self-Care Activities - begin a heart failure diary - bring diary to all appointments - develop a rescue plan - eat more whole grains, fruits and vegetables, lean meats and healthy fats - follow rescue plan if symptoms flare-up - know when to call the doctor - track symptoms and what helps feel better or worse Follow Up Plan: Telephone follow up appointment with care management team member scheduled for: 09/14/20   Patient Care Plan: RNCM:Diabetes Type 2 (Adult)    Problem Identified: RNCM: Need for effective self management of Type 2 Diabetes   Priority: Medium    Long-Range Goal: RNCM:Effective self mangement of Type 2 Diabetes   Start Date: 08/10/2020  Expected End Date: 12/22/2020  This Visit's Progress: On track  Priority: Medium  Note:   Objective:  Lab Results  Component Value Date   HGBA1C 6.7 (A) 07/21/2020 .   Lab Results  Component Value Date   CREATININE 1.26 (H) 07/21/2020   CREATININE 1.24 (H) 05/23/2020   CREATININE 1.08 11/19/2019 .   [name] No results found for: EGFR Current Barriers:  [name] Knowledge Deficits related to basic Diabetes pathophysiology and self care/management . Difficulty obtaining or cannot afford medications reports currently getting patient assistance  for Farxiga . Does not adhere to provider recommendations re: ADA/low sodium diet and exercise at times Case Manager Clinical Goal(s):  . patient will demonstrate improved adherence to prescribed treatment plan for diabetes self care/management as evidenced by: daily monitoring and recording of CBG  adherence to ADA/ carb modified diet exercise 3-4 days/week adherence to prescribed medication regimen contacting provider for new or worsened symptoms or questions Interventions:  . Collaboration with [name], MD regarding development and update of comprehensive plan of care as evidenced by provider attestation and co-signature . Inter-disciplinary care team collaboration (see longitudinal plan of care) . Provided education to patient about basic DM disease process . Reviewed medications with patient and discussed importance of medication adherence . Discussed plans with patient for ongoing care management follow up and provided patient with direct contact information for care management team . Provided patient with written educational materials related to hypo and hyperglycemia and importance of correct treatment . Reviewed scheduled/upcoming provider appointments including: Dr. Birdie Riddle 11/18/20, Dr. Kelton Pillar 01/18/21 . Advised patient, providing education and rationale, to check cbg daily and record, calling primary care provider for findings outside established parameters.   . Referral made to pharmacy team for assistance with medication issues and pharmacy assistance programs . Review of patient status, including review of consultants reports, relevant laboratory and other test results, and medications completed. Patient Goals/Self-Care Activities - Self administers oral medications as prescribed Attends all scheduled provider appointments Checks blood sugars as prescribed and utilize hyper and hypoglycemia protocol as needed Adheres to prescribed ADA/carb  modified - check blood sugar  at prescribed times - check blood sugar if I feel it is too high or too low - enter blood sugar readings and medication or insulin into daily log - take the blood sugar log to all doctor visits Follow Up Plan: Telephone follow up appointment with care management team member scheduled for: 09/14/20 at 9 AM   Patient Care Plan: CCM Pharmacy Care Plan    Problem Identified: CKD HLD HTN DM     Long-Range Goal: Disease Management   Start Date: 08/25/2020  Expected End Date: 08/25/2021  This Visit's Progress: On track  Priority: High  Note:   Medication Assistance: Farxiga[place].  Enrollment ends 05/2021 Patient's preferred pharmacy is:  [id number] [place] [id number]: [phone]: [phone] Patient decided to: Continue current medication management strategy Patient agrees to Care Plan and Follow-up.  Current Barriers:  . Unable to independently afford treatment regimen  Pharmacist Clinical Goal(s):  .Marland KitchenOver the next 365 days, patient will verbalize ability to afford treatment regimen . achieve adherence to monitoring guidelines and medication adherence to achieve therapeutic efficacy . contact provider office for questions/concerns as evidenced notation of same in electronic health record through collaboration with PharmD and provider.   Interventions: . 1:1 collaboration with TMidge Minium MD regarding development and update of comprehensive plan of care as evidenced by provider attestation and co-signature . Inter-disciplinary care team collaboration (see longitudinal plan of care) . Comprehensive medication review performed; medication list updated in electronic medical record  Hypertension (BP goal <130/80) -Controlled  -stage III CKD, DM II  -Current treatment: . Amlodipine 10 mg once daily (PCP) . Carvedilol 25 mg twice daily (Dr  JMartinique -Previously on RAASi therapy with spironolactone and losartan but was stopped after developing AKI.  -Current home readings: none provided, ~130/80 most recent OV -Current dietary habits: trying to limit sugar, pt states currently focused on reducing phosphorus intake  -Denies hypotensive/hypertensive symptoms -Educated on BP goals and benefits of medications for prevention of heart attack, stroke and kidney damage; Daily salt intake goal < 2300 mg; Exercise goal of 150 minutes per week; -Counseled to monitor BP at home, document, and provide log at future appointments -Counseled on diet and exercise extensively Recommended to continue current medication  Diabetes (A1c goal <7%) -Controlled -Current medications: . Farxiga 10 mg once daily . Metformin 500 mg twice daily . Glipizide 5 mg once daily  -Current home glucose readings  fasting glucose: 100s on avg per pt, ranging 90s-120s  -Denies hypoglycemic/hyperglycemic symptoms -Current meal patterns: see HTN -Current exercise: limited by back pain  -Educated onA1c and blood sugar goals; Complications of diabetes including kidney damage, retinal damage, and cardiovascular disease; Prevention and management of hypoglycemic episodes; Benefits of routine self-monitoring of blood sugar; -Counseled to check feet daily and get yearly eye exams -Counseled on diet and exercise extensively Recommended to continue current medication -Supported patient with medication refill for Farxiga through [place] patient assistance. [employer] stated 90 DS sent to patient 07/28/2020 and would have been delivered 08/05/2020 to front desk. They will issue replacement order as patient has already checked with front desk and states they did not receive medication (previous UPS tracking number [phone]. In the future delivery driver will attempt patient's appt before delivering to front desk. PAP representative will submit for approval of expedited  shipping otherwise 90DS replacement order will take 7-12 days. Confirmed  correct address with both patient and PAP. Pt informed.  -Refill line [phone].  Patient Goals/Self-Care Activities . Over the next 365 days, patient will:  - take medications as prescribed collaborate with provider on medication access solutions engage in dietary modifications by reducing phosphorus intake, starting with soda.       The patient verbalized understanding of instructions provided today and agreed to receive a MyChart copy of patient instruction and/or educational materials. Telephone follow up appointment with pharmacy team member scheduled for: See next appointment with "Care Management Staff" under "What's Next" below.   Diabetes Mellitus and Nutrition, Adult When you have diabetes, or diabetes mellitus, it is very important to have healthy eating habits because your blood sugar (glucose) levels are greatly affected by what you eat and drink. Eating healthy foods in the right amounts, at about the same times every day, can help you:  Control your blood glucose.  Lower your risk of heart disease.  Improve your blood pressure.  Reach or maintain a healthy weight. What can affect my meal plan? Every person with diabetes is different, and each person has different needs for a meal plan. Your health care provider may recommend that you work with a dietitian to make a meal plan that is best for you. Your meal plan may vary depending on factors such as:  The calories you need.  The medicines you take.  Your weight.  Your blood glucose, blood pressure, and cholesterol levels.  Your activity level.  Other health conditions you have, such as heart or kidney disease. How do carbohydrates affect me? Carbohydrates, also called carbs, affect your blood glucose level more than any other type of food. Eating carbs naturally raises the amount of glucose in your blood. Carb counting is a method for  keeping track of how many carbs you eat. Counting carbs is important to keep your blood glucose at a healthy level, especially if you use insulin or take certain oral diabetes medicines. It is important to know how many carbs you can safely have in each meal. This is different for every person. Your dietitian can help you calculate how many carbs you should have at each meal and for each snack. How does alcohol affect me? Alcohol can cause a sudden decrease in blood glucose (hypoglycemia), especially if you use insulin or take certain oral diabetes medicines. Hypoglycemia can be a life-threatening condition. Symptoms of hypoglycemia, such as sleepiness, dizziness, and confusion, are similar to symptoms of having too much alcohol.  Do not drink alcohol if: ? Your health care provider tells you not to drink. ? You are pregnant, may be pregnant, or are planning to become pregnant.  If you drink alcohol: ? Do not drink on an empty stomach. ? Limit how much you use to:  0-1 drink a day for women.  0-2 drinks a day for men. ? Be aware of how much alcohol is in your drink. In the [place] one drink equals one 12 oz bottle of beer (355 mL), one 5 oz glass of wine (148 mL), or one 1 oz glass of hard liquor (44 mL). ? Keep yourself hydrated with water, diet soda, or unsweetened iced tea.  Keep in mind that regular soda, juice, and other mixers may contain a lot of sugar and must be counted as carbs. What are tips for following this plan? Reading food labels  Start by checking the serving size on the "Nutrition Facts" label of packaged foods and drinks. The amount of  calories, carbs, fats, and other nutrients listed on the label is based on one serving of the item. Many items contain more than one serving per package.  Check the total grams (g) of carbs in one serving. You can calculate the number of servings of carbs in one serving by dividing the total carbs by 15. For example, if a food has 30 g of  total carbs per serving, it would be equal to 2 servings of carbs.  Check the number of grams (g) of saturated fats and trans fats in one serving. Choose foods that have a low amount or none of these fats.  Check the number of milligrams (mg) of salt (sodium) in one serving. Most people should limit total sodium intake to less than 2,300 mg per day.  Always check the nutrition information of foods labeled as "low-fat" or "nonfat." These foods may be higher in added sugar or refined carbs and should be avoided.  Talk to your dietitian to identify your daily goals for nutrients listed on the label. Shopping  Avoid buying canned, pre-made, or processed foods. These foods tend to be high in fat, sodium, and added sugar.  Shop around the outside edge of the grocery store. This is where you will most often find fresh fruits and vegetables, bulk grains, fresh meats, and fresh dairy. Cooking  Use low-heat cooking methods, such as baking, instead of high-heat cooking methods like deep frying.  Cook using healthy oils, such as olive, canola, or sunflower oil.  Avoid cooking with butter, cream, or high-fat meats. Meal planning  Eat meals and snacks regularly, preferably at the same times every day. Avoid going long periods of time without eating.  Eat foods that are high in fiber, such as fresh fruits, vegetables, beans, and whole grains. Talk with your dietitian about how many servings of carbs you can eat at each meal.  Eat 4-6 oz (112-168 g) of lean protein each day, such as lean meat, chicken, fish, eggs, or tofu. One ounce (oz) of lean protein is equal to: ? 1 oz (28 g) of meat, chicken, or fish. ? 1 egg. ?  cup (62 g) of tofu.  Eat some foods each day that contain healthy fats, such as avocado, nuts, seeds, and fish.   What foods should I eat? Fruits Berries. Apples. Oranges. Peaches. Apricots. Plums. Grapes. Mango. Papaya. Pomegranate. Kiwi. Cherries. Vegetables Lettuce. Spinach.  Leafy greens, including kale, chard, collard greens, and mustard greens. Beets. Cauliflower. Cabbage. Broccoli. Carrots. Green beans. Tomatoes. Peppers. Onions. Cucumbers. Brussels sprouts. Grains Whole grains, such as whole-wheat or whole-grain bread, crackers, tortillas, cereal, and pasta. Unsweetened oatmeal. Quinoa. Brown or wild rice. Meats and other proteins Seafood. Poultry without skin. Lean cuts of poultry and beef. Tofu. Nuts. Seeds. Dairy Low-fat or fat-free dairy products such as milk, yogurt, and cheese. The items listed above may not be a complete list of foods and beverages you can eat. Contact a dietitian for more information. What foods should I avoid? Fruits Fruits canned with syrup. Vegetables Canned vegetables. Frozen vegetables with butter or cream sauce. Grains Refined white flour and flour products such as bread, pasta, snack foods, and cereals. Avoid all processed foods. Meats and other proteins Fatty cuts of meat. Poultry with skin. Breaded or fried meats. Processed meat. Avoid saturated fats. Dairy Full-fat yogurt, cheese, or milk. Beverages Sweetened drinks, such as soda or iced tea. The items listed above may not be a complete list of foods and beverages you should avoid.  Contact a dietitian for more information. Questions to ask a health care provider  Do I need to meet with a diabetes educator?  Do I need to meet with a dietitian?  What number can I call if I have questions?  When are the best times to check my blood glucose? Where to find more information:  American Diabetes Association: diabetes.org  Academy of Nutrition and Dietetics: www.eatright.[employer] of Diabetes and Digestive and Kidney Diseases: [employer]  Association of Diabetes Care and Education Specialists: www.diabeteseducator.org Summary  It is important to have healthy eating habits because your blood sugar (glucose) levels are greatly affected by what you  eat and drink.  A healthy meal plan will help you control your blood glucose and maintain a healthy lifestyle.  Your health care provider may recommend that you work with a dietitian to make a meal plan that is best for you.  Keep in mind that carbohydrates (carbs) and alcohol have immediate effects on your blood glucose levels. It is important to count carbs and to use alcohol carefully. This information is not intended to replace advice given to you by your health care provider. Make sure you discuss any questions you have with your health care provider. Document Revised: 05/19/2019 Document Reviewed: 05/19/2019 Elsevier Patient Education  2021 [employer].

## 2020-09-02 ENCOUNTER — Ambulatory Visit: Payer: Medicare Other | Admitting: Internal Medicine

## 2020-09-06 ENCOUNTER — Telehealth: Payer: Self-pay

## 2020-09-06 NOTE — Chronic Care Management (AMB) (Signed)
Chronic Care Management Pharmacy Assistant   Name: Kayla Brown  MRN: [id number] DOB: Jan 16, 1951  Reason for Encounter: Patient Assistance Documentation  Medications: Outpatient Encounter Medications as of 09/06/2020  Medication Sig  . albuterol (VENTOLIN HFA) 108 (90 Base) MCG/ACT inhaler Inhale 1-2 puffs into the lungs every 6 (six) hours as needed for wheezing or shortness of breath.  . Alcohol Swabs (ALCOHOL PREP) PADS Pt uses an alcohol pad each time sugars are tested. Pt tests twice daily. Dx E11.9  . amLODipine (NORVASC) 10 MG tablet TAKE 1 TABLET BY MOUTH EVERY DAY  . atorvastatin (LIPITOR) 40 MG tablet TAKE 1 TABLET BY MOUTH EVERY DAY  . carvedilol (COREG) 25 MG tablet Take 1 tablet (25 mg total) by mouth 2 (two) times daily.  . Cholecalciferol (VITAMIN D PO) Take 2,000 Units by mouth daily.   . dapagliflozin propanediol (FARXIGA) 10 MG TABS tablet Take 1 tablet (10 mg total) by mouth daily.  . fish oil-omega-3 fatty acids 1000 MG capsule Take 1 g by mouth daily.  . fluticasone (FLONASE) 50 MCG/ACT nasal spray SPRAY 2 SPRAYS INTO EACH NOSTRIL EVERY DAY (Patient taking differently: Place 2 sprays into both nostrils daily.)  . furosemide (LASIX) 20 MG tablet TAKE 1 TABLET BY MOUTH EVERY DAY  . gabapentin (NEURONTIN) 100 MG capsule Take 100 mg by mouth 3 (three) times daily.  [name] glipiZIDE (GLUCOTROL) 5 MG tablet Take 1 tablet (5 mg total) by mouth 2 (two) times daily before a meal.  . glucose blood (FREESTYLE LITE) test strip USE TO TEST BLOOD GLUCOSE 2 TIMES DAILY. Dx E11.9  . loratadine (CLARITIN) 10 MG tablet TAKE 1 TABLET BY MOUTH EVERY DAY (Patient taking differently: Take 10 mg by mouth daily.)  . Melatonin 3 MG TABS Take 10 mg by mouth at bedtime.   . metFORMIN (GLUCOPHAGE-XR) 500 MG 24 hr tablet Take 1 tablet (500 mg total) by mouth in the morning and at bedtime.  . methocarbamol (ROBAXIN) 500 MG tablet Take 500 mg by mouth 2 (two) times daily.  . methylPREDNISolone  (MEDROL DOSEPAK) 4 MG TBPK tablet Take by mouth.  . Multiple Vitamins-Minerals (VISION FORMULA) TABS Take 1 tablet by mouth daily.  [name] OVER THE COUNTER MEDICATION Take 1 tablet by mouth daily. Equate Vision Formula 50+  . sertraline (ZOLOFT) 50 MG tablet Take 1 tablet (50 mg total) by mouth daily.  . traMADol (ULTRAM) 50 MG tablet Take 1 tablet (50 mg total) by mouth every 6 (six) hours as needed.  . traMADol (ULTRAM) 50 MG tablet Take 1-2 tablets (50-100 mg total) by mouth daily as needed.  . TRUEPLUS LANCETS 30G MISC Use one lancet each time sugars are tested. Pt tests twice daily. Dx. E11.9  . UNABLE TO FIND Med Name: Healthwise talking meter   No facility-administered encounter medications on file as of 09/06/2020.     Patient called and left a voicemail stating that she still has not yet received any shipment for medication Farxiga.  Per chart note on 08/25/2020 with clinical pharmacist Dahlia Byes, CPP - PAP representative will submit for approval of expedited shipping otherwise 90DS replacement order will take 7-12 days. Confirmed correct address with both patient and PAP.  Patient states she did not receive any shipment.  I called and spoke with [place] [employer] [place] and was told this replacement shipment was shipped to the patient on 08/26/2020 by [employer]. They will need to speak with the patient before sending any further medication to the provided address.  We attempted to contact the patient during the time of our call with no answer.  I left a message for the patient to call back. She will have to contact for further assistance. -Refill line [phone]  -09/07/2020 I spoke with the patient, she is aware and verbally understands. She will have to call for any further shipments and will have to sign to receive the medication.  April D Calhoun, [facility] Clinical Pharmacist Assistant [phone]

## 2020-09-14 ENCOUNTER — Ambulatory Visit: Payer: Medicare Other

## 2020-09-14 DIAGNOSIS — N1832 Chronic kidney disease, stage 3b: Secondary | ICD-10-CM

## 2020-09-14 DIAGNOSIS — I5032 Chronic diastolic (congestive) heart failure: Secondary | ICD-10-CM | POA: Diagnosis not present

## 2020-09-14 DIAGNOSIS — I10 Essential (primary) hypertension: Secondary | ICD-10-CM

## 2020-09-14 DIAGNOSIS — E1122 Type 2 diabetes mellitus with diabetic chronic kidney disease: Secondary | ICD-10-CM | POA: Diagnosis not present

## 2020-09-14 NOTE — Chronic Care Management (AMB) (Signed)
Chronic Care Management   CCM RN Visit Note  09/14/2020 Name: Kayla Brown MRN: [id number] DOB: 09-28-1950  Subjective: Kayla Brown is a 70 y.o. year old female who is a primary care patient of Birdie Riddle, [name], MD. The care management team was consulted for assistance with disease management and care coordination needs.    Engaged with patient by telephone for follow up visit in response to provider referral for case management and/or care coordination services.   Consent to Services:  The patient was given information about Chronic Care Management services, agreed to services, and gave verbal consent prior to initiation of services.  Please see initial visit note for detailed documentation.   Patient agreed to services and verbal consent obtained.   Assessment: Review of patient past medical history, allergies, medications, health status, including review of consultants reports, laboratory and other test data, was performed as part of comprehensive evaluation and provision of chronic care management services.   SDOH (Social Determinants of Health) assessments and interventions performed:    CCM Care Plan  No Known Allergies  Outpatient Encounter Medications as of 09/14/2020  Medication Sig   albuterol (VENTOLIN HFA) 108 (90 Base) MCG/ACT inhaler Inhale 1-2 puffs into the lungs every 6 (six) hours as needed for wheezing or shortness of breath.   Alcohol Swabs (ALCOHOL PREP) PADS Pt uses an alcohol pad each time sugars are tested. Pt tests twice daily. Dx E11.9   amLODipine (NORVASC) 10 MG tablet TAKE 1 TABLET BY MOUTH EVERY DAY   atorvastatin (LIPITOR) 40 MG tablet TAKE 1 TABLET BY MOUTH EVERY DAY   carvedilol (COREG) 25 MG tablet Take 1 tablet (25 mg total) by mouth 2 (two) times daily.   Cholecalciferol (VITAMIN D PO) Take 2,000 Units by mouth daily.    dapagliflozin propanediol (FARXIGA) 10 MG TABS tablet Take 1 tablet (10 mg total) by mouth daily.   fish oil-omega-3  fatty acids 1000 MG capsule Take 1 g by mouth daily.   fluticasone (FLONASE) 50 MCG/ACT nasal spray SPRAY 2 SPRAYS INTO EACH NOSTRIL EVERY DAY (Patient taking differently: Place 2 sprays into both nostrils daily.)   furosemide (LASIX) 20 MG tablet TAKE 1 TABLET BY MOUTH EVERY DAY   gabapentin (NEURONTIN) 100 MG capsule Take 100 mg by mouth 3 (three) times daily.   glipiZIDE (GLUCOTROL) 5 MG tablet Take 1 tablet (5 mg total) by mouth 2 (two) times daily before a meal.   glucose blood (FREESTYLE LITE) test strip USE TO TEST BLOOD GLUCOSE 2 TIMES DAILY. Dx E11.9   loratadine (CLARITIN) 10 MG tablet TAKE 1 TABLET BY MOUTH EVERY DAY (Patient taking differently: Take 10 mg by mouth daily.)   Melatonin 3 MG TABS Take 10 mg by mouth at bedtime.    metFORMIN (GLUCOPHAGE-XR) 500 MG 24 hr tablet Take 1 tablet (500 mg total) by mouth in the morning and at bedtime.   methocarbamol (ROBAXIN) 500 MG tablet Take 500 mg by mouth 2 (two) times daily.   methylPREDNISolone (MEDROL DOSEPAK) 4 MG TBPK tablet Take by mouth.   Multiple Vitamins-Minerals (VISION FORMULA) TABS Take 1 tablet by mouth daily.   OVER THE COUNTER MEDICATION Take 1 tablet by mouth daily. Equate Vision Formula 50+   sertraline (ZOLOFT) 50 MG tablet Take 1 tablet (50 mg total) by mouth daily.   traMADol (ULTRAM) 50 MG tablet Take 1 tablet (50 mg total) by mouth every 6 (six) hours as needed.   traMADol (ULTRAM) 50 MG tablet Take 1-2 tablets (50-100  mg total) by mouth daily as needed.   TRUEPLUS LANCETS 30G MISC Use one lancet each time sugars are tested. Pt tests twice daily. Dx. E11.9   UNABLE TO FIND Med Name: Healthwise talking meter   No facility-administered encounter medications on file as of 09/14/2020.    Patient Active Problem List   Diagnosis Date Noted   Type 2 diabetes mellitus with stage 3a chronic kidney disease, without long-term current use of insulin ([place]) 07/21/2020   Type 2 diabetes mellitus with  hyperglycemia, with long-term current use of insulin ([place]) 04/19/2020   Left hip pain 12/01/2019   Type 2 diabetes mellitus with stage 3b chronic kidney disease, without long-term current use of insulin ([place]) 16/38/4665   Diastolic CHF ([place]) 99/35/7017   Dyspnea 03/08/2019   Pulmonary edema 03/04/2019   Physical exam 02/06/2017   Anxiety and depression 12/05/2016   Obesity (BMI 30-39.9) 03/01/2016   OSA (obstructive sleep apnea) 07/11/2015   Pulmonary hypertension ([place]) 05/06/2015   Heart murmur 03/03/2015   Essential hypertension, benign 03/18/2014   Hyperlipidemia 03/18/2014   Post-menopause on HRT (hormone replacement therapy) 03/18/2014    Conditions to be addressed/monitored:CHF, HTN, DMII and CKD Stage 3  Care Plan : RNCM:Chronic Kidney (Adult)  Updates made by [name], RN since 09/14/2020 12:00 AM    Problem: RNCM: Need for long term self management plan for chronic kidney disease   Priority: Medium    Long-Range Goal: RNCM : Disease Progression Prevented or Minimized   Start Date: 08/10/2020  Expected End Date: 12/22/2020  This Visit's Progress: On track  Recent Progress: On track  Priority: Medium  Note:   Current Barriers:   Knowledge Deficits related to Chronic kidney disease stage 3  now being followed by nephrology reports next appt with Dr.Upton in June, reports B/P readings 120-130/60-80  Does not adhere to provider recommendations re: diet and fluids recommendations at times-reports avoiding dark sodas and drinking water instead  Nurse Case Manager Clinical Goal(s):   patient will verbalize understanding of plan for self management of chronic kidney disease  patient will work with CCM team and primary care provider to address needs related to chronic kidney disease  patient will attend all scheduled medical appointments: Dr. Birdie Riddle 11/18/20, Dr. Kelton Pillar 01/18/21  Dr.Upton June  patient will verbalize basic understanding of chronic  kidney disease process and self health management plan as evidenced by no worsening of kidney function, adherence to diet and fluid recommendations   patient will work with CM team pharmacist to self manage medications  the patient will demonstrate ongoing self health care management ability as evidenced by adherence to plan of care, medications, diet and provider appts, call providers with problems or concerns*  Interventions:   1:1 collaboration with [name], MD regarding development and update of comprehensive plan of care as evidenced by provider attestation and co-signature  Inter-disciplinary care team collaboration (see longitudinal plan of care)  Evaluation of current treatment plan related to chronic kidney disease and patient's adherence to plan as established by provider.  Reinforced education to patient re: chronic kidney disease self management  Reviewed importance of keeping blood sugar and B/P under control to help prevent further kidney damage  Reviewed medications with patient and discussed to call CCM team for any problems or concerns  Reviewed written educational materials related to chronic kidney disease  Reviewed scheduled/upcoming provider appointments including: Dr. Birdie Riddle 11/18/20, Dr. Kelton Pillar 01/18/21, [facility] in June  Discussed plans with patient for ongoing  care management follow up and provided patient with direct contact information for care management team  Patient Goals/Self-Care Activities Over the next 120 days, patient will:  - Patient will self administer medications as prescribed Patient will attend all scheduled provider appointments Patient will call pharmacy for medication refills Patient will call provider office for new concerns or questions -try walking 3-4 times a week and gradually increase time and number of days  -follow diet and fluid recommendations from kidney doctor  Follow Up Plan: Telephone follow up  appointment with care management team member scheduled for: 10/26/20 at 10 AM       Care Plan : RNCM:Heart Failure (Adult)  Updates made by [name], RN since 09/14/2020 12:00 AM    Problem: RNCM: Knowledge deficit related to long term self management of heart failure   Priority: High    Long-Range Goal: Effective self mangement of heart failure   Start Date: 08/10/2020  Expected End Date: 12/22/2020  This Visit's Progress: On track  Recent Progress: On track  Priority: High  Note:   Current Barriers:   Does not adhere to provider recommendations re: diet and fluid recommendations at times  Reports weighing daily with weight stable at 172 lb. Reports she has not needed to take extra Lasix recently.  Denies any swelling, shortness of breath or chest pains  Case Manager Clinical Goal(s):   patient will weigh self daily and record  patient will verbalize understanding of Heart Failure Action Plan and when to call doctor  patient will take all Heart Failure mediations as prescribed  patient will weigh daily and record (notifying MD of 3 lb weight gain over night or 5 lb in a week) Interventions:   Collaboration with [name], MD regarding development and update of comprehensive plan of care as evidenced by provider attestation and co-signature  Inter-disciplinary care team collaboration (see longitudinal plan of care)  Basic overview and discussion of pathophysiology of Heart Failure reviewed   Reinforced verbal education on low sodium diet  Reviewed Heart Failure Action Plan in depth and provided written copy  Provided education about placing scale on hard, flat surface  Advised patient to weigh each morning after emptying bladder  Discussed importance of daily weight and advised patient to weigh and record daily  Reviewed role of diuretics in prevention of fluid overload and management of heart failure Patient Goals/Self-Care Activities - begin a  heart failure diary - bring diary to all appointments - develop a rescue plan - eat more whole grains, fruits and vegetables, lean meats and healthy fats - follow rescue plan if symptoms flare-up - know when to call the doctor - track symptoms and what helps feel better or worse Follow Up Plan: Telephone follow up appointment with care management team member scheduled for: 10/26/20 at 10 AM   Care Plan : RNCM:Diabetes Type 2 (Adult)  Updates made by [name], RN since 09/14/2020 12:00 AM    Problem: RNCM: Need for effective self management of Type 2 Diabetes   Priority: Medium    Long-Range Goal: RNCM:Effective self mangement of Type 2 Diabetes   Start Date: 08/10/2020  Expected End Date: 12/22/2020  This Visit's Progress: On track  Recent Progress: On track  Priority: Medium  Note:   Objective:  Lab Results  Component Value Date   HGBA1C 6.7 (A) 07/21/2020    Lab Results  Component Value Date   CREATININE 1.26 (H) 07/21/2020   CREATININE 1.24 (H) 05/23/2020  CREATININE 1.08 11/19/2019     No results found for: EGFR Current Barriers:   Knowledge Deficits related to basic Diabetes pathophysiology and self care/management  Difficulty obtaining or cannot afford medications reports currently getting patient assistance for Wilder Glade, reports she is waiting to get delivery of Farxiga by certified mail that she will need to sign to receive delivery.   Does not adhere to provider recommendations re: ADA/low sodium diet and exercise at times  Reports blood sugars have been ranging from 105-178 fasting denies any hypoglycemia, reports walking apx 2 times a week weather permitting  Case Manager Clinical Goal(s):   patient will demonstrate improved adherence to prescribed treatment plan for diabetes self care/management as evidenced by: daily monitoring and recording of CBG  adherence to ADA/ carb modified diet exercise 3-4 days/week adherence to prescribed medication regimen  contacting provider for new or worsened symptoms or questions Interventions:   Collaboration with [name], MD regarding development and update of comprehensive plan of care as evidenced by provider attestation and co-signature  Inter-disciplinary care team collaboration (see longitudinal plan of care)  Reviewed education about basic DM disease process  Reviewed medications with patient and discussed importance of medication adherence  Discussed plans with patient for ongoing care management follow up and provided patient with direct contact information for care management team  Reinforced education related to hypo and hyperglycemia and importance of correct treatment  Reviewed scheduled/upcoming provider appointments including: Dr. Birdie Riddle 11/18/20, Dr. Kelton Pillar 01/18/21, Dr. Hollie Salk in June  Reinforced to check cbg daily and record, calling primary care provider for findings outside established parameters.    Review of patient status, including review of consultants reports, relevant laboratory and other test results, and medications completed. Patient Goals/Self-Care Activities - Self administers oral medications as prescribed Attends all scheduled provider appointments Checks blood sugars as prescribed and utilize hyper and hypoglycemia protocol as needed Adheres to prescribed ADA/carb modified - check blood sugar at prescribed times - check blood sugar if I feel it is too high or too low - enter blood sugar readings and medication or insulin into daily log - take the blood sugar log to all doctor visits Follow Up Plan: Telephone follow up appointment with care management team member scheduled for: 10/26/20 at 10 AM     Plan:Telephone follow up appointment with care management team member scheduled for:  10/26/20 at 10 AM Peter Garter RN, [facility], CDE Care Management Coordinator [facility] [phone], Mobile [phone]

## 2020-09-14 NOTE — Patient Instructions (Addendum)
Visit Information  PATIENT GOALS: Goals Addressed            This Visit's Progress   . RNCM Monitor and Manage My Diabetes Type 2 and chronic kidney disease   On track    Timeframe:  Long-Range Goal Priority:  Medium Start Date:    08/10/20                         Expected End Date:     12/22/20                  Follow Up Date 10/26/2020 at 10 AM    - check blood sugar at prescribed times - check blood sugar if I feel it is too high or too low - enter blood sugar readings and medication or insulin into daily log - take the blood sugar log to all doctor visits  -try walking 3-4 times a week and gradually increase time and number of days  -follow diet and fluid recommendations from kidney doctor   Why is this important?    Checking your blood sugar at home helps to keep it from getting very high or very low.   Writing the results in a diary or log helps the doctor know how to care for you.   Your blood sugar log should have the time, date and the results.   Also, write down the amount of insulin or other medicine that you take.   Other information, like what you ate, exercise done and how you were feeling, will also be helpful.     Notes:     . RNCM:Better track and self manage heart failure symptoms   On track    Timeframe:  Long-Range Goal Priority:  High Start Date:  08/10/20                           Expected End Date: 12/22/20                      Follow Up Date 10/26/20 at 10 AM   - begin a heart failure diary - bring diary to all appointments - develop a rescue plan - eat more whole grains, fruits and vegetables, lean meats and healthy fats - follow rescue plan if symptoms flare-up - know when to call the doctor - track symptoms and what helps feel better or worse - dress right for the weather, hot or cold    Why is this important?   You will be able to handle your symptoms better if you keep track of them.  Making some simple changes to your lifestyle will  help.  Eating healthy is one thing you can do to take good care of yourself.         Heart Failure Action Plan A heart failure action plan helps you understand what to do when you have symptoms of heart failure. Your action plan is a color-coded plan that lists the symptoms to watch for and indicates what actions to take.  If you have symptoms in the red zone, you need medical care right away.  If you have symptoms in the yellow zone, you are having problems.  If you have symptoms in the green zone, you are doing well. Follow the plan that was created by you and your health care provider. Review your plan each time you visit your health care provider. Red  zone These signs and symptoms mean you should get medical help right away:  You have trouble breathing when resting.  You have a dry cough that is getting worse.  You have swelling or pain in your legs or abdomen that is getting worse.  You suddenly gain more than 2-3 lb (0.9-1.4 kg) in 24 hours, or more than 5 lb (2.3 kg) in a week. This amount may be more or less depending on your condition.  You have trouble staying awake or you feel confused.  You have chest pain.  You do not have an appetite.  You pass out.  You have worsening sadness or depression. If you have any of these symptoms, call your local emergency services (911 in the [place] right away. Do not drive yourself to the hospital.   Yellow zone These signs and symptoms mean your condition may be getting worse and you should make some changes:  You have trouble breathing when you are active, or you need to sleep with your head raised on extra pillows to help you breathe.  You have swelling in your legs or abdomen.  You gain 2-3 lb (0.9-1.4 kg) in 24 hours, or 5 lb (2.3 kg) in a week. This amount may be more or less depending on your condition.  You get tired easily.  You have trouble sleeping.  You have a dry cough. If you have any of these  symptoms:  Contact your health care provider within the next day.  Your health care provider may adjust your medicines.   Green zone These signs mean you are doing well and can continue what you are doing:  You do not have shortness of breath.  You have very little swelling or no new swelling.  Your weight is stable (no gain or loss).  You have a normal activity level.  You do not have chest pain or any other new symptoms.   Follow these instructions at home:  Take over-the-counter and prescription medicines only as told by your health care provider.  Weigh yourself daily. Your target weight is __________ lb (__________ kg). ? Call your health care provider if you gain more than __________ lb (__________ kg) in 24 hours, or more than __________ lb (__________ kg) in a week. ? Health care provider name: _____________________________________________________ ? Health care provider phone number: _____________________________________________________  Eat a heart-healthy diet. Work with a diet and nutrition specialist (dietitian) to create an eating plan that is best for you.  Keep all follow-up visits. This is important. Where to find more information  American Heart Association: www.heart.org Summary  A heart failure action plan helps you understand what to do when you have symptoms of heart failure.  Follow the action plan that was created by you and your health care provider.  Get help right away if you have any symptoms in the red zone. This information is not intended to replace advice given to you by your health care provider. Make sure you discuss any questions you have with your health care provider. Document Revised: 01/25/2020 Document Reviewed: 01/25/2020 Elsevier Patient Education  2021 [employer]   Patient verbalizes understanding of instructions provided today and agrees to view in [place].   Telephone follow up appointment with care management team member  scheduled for: 10/26/20 at 10 AM  Dudley Major RN, [facility], CDE Care Management Coordinator [facility] [phone], Mobile [phone]

## 2020-09-30 ENCOUNTER — Ambulatory Visit (INDEPENDENT_AMBULATORY_CARE_PROVIDER_SITE_OTHER): Payer: Medicare Other

## 2020-09-30 DIAGNOSIS — I10 Essential (primary) hypertension: Secondary | ICD-10-CM

## 2020-09-30 DIAGNOSIS — E785 Hyperlipidemia, unspecified: Secondary | ICD-10-CM

## 2020-09-30 DIAGNOSIS — N1831 Chronic kidney disease, stage 3a: Secondary | ICD-10-CM | POA: Diagnosis not present

## 2020-09-30 DIAGNOSIS — E1122 Type 2 diabetes mellitus with diabetic chronic kidney disease: Secondary | ICD-10-CM

## 2020-09-30 NOTE — Progress Notes (Signed)
Chronic Care Management Pharmacy Note  09/30/2020 Name:  Kayla Brown MRN:  [id number] DOB:  October 10, 1950  Subjective: Kayla Brown is an 70 y.o. year old female who is a primary patient of [name], [name], MD.  The CCM team was consulted for assistance with disease management and care coordination needs.    Engaged with patient by telephone for follow up visit in response to provider referral for pharmacy case management and/or care coordination services.   Consent to Services:  The patient was given information about Chronic Care Management services, agreed to services, and gave verbal consent prior to initiation of services.  Please see initial visit note for detailed documentation.   Patient Care Team: Kayla Minium, MD as PCP - General (Family Medicine) Kayla Spates, MD (Inactive) as Consulting Physician (Gastroenterology) Kayla Salon, MD as Consulting Physician (Gynecology) Kayla Brown, [facility] as Pharmacist (Pharmacist) Kayla Ped, RN as [facility] visits: None in previous 6 months  Objective:  Lab Results  Component Value Date   CREATININE 1.26 (H) 07/21/2020   CREATININE 1.24 (H) 05/23/2020   CREATININE 1.08 11/19/2019    Lab Results  Component Value Date   HGBA1C 6.7 (A) 07/21/2020   Last diabetic Eye exam:  Lab Results  Component Value Date/Time   HMDIABEYEEXA No Retinopathy 08/01/2020 03:45 PM    Last diabetic Foot exam:  Lab Results  Component Value Date/Time   HMDIABFOOTEX completed 01/24/2016 12:00 AM        Component Value Date/Time   CHOL 115 05/23/2020 1431   TRIG 232.0 (H) 05/23/2020 1431   HDL 45.70 05/23/2020 1431   CHOLHDL 3 05/23/2020 1431   VLDL 46.4 (H) 05/23/2020 1431   LDLCALC 61 05/25/2019 1352   LDLDIRECT 50.0 05/23/2020 1431    Hepatic Function Latest Ref Rng & Units 05/23/2020 11/19/2019 05/25/2019  Total Protein 6.0 - 8.3 g/dL 6.6 6.9 6.8  Albumin 3.5 - 5.2 g/dL 4.3 4.5 4.1  AST 0 - 37 U/L _0 ALT 0 - 35 U/L _1 Alk Phosphatase 39 - 117 U/L 61 64 62  Total Bilirubin 0.2 - 1.2 mg/dL 0.3 0.3 0.3  Bilirubin, Direct 0.0 - 0.3 mg/dL 0.1 0.1 0.1    Lab Results  Component Value Date/Time   TSH 1.32 05/23/2020 02:31 PM   TSH 1.40 11/19/2019 02:50 PM    CBC Latest Ref Rng & Units 05/23/2020 11/19/2019 05/25/2019  WBC 4.0 - 10.5 K/uL 10.0 10.4 11.4(H)  Hemoglobin 12.0 - 15.0 g/dL 13.0 12.7 12.6  Hematocrit 36.0 - 46.0 % 39.4 37.5 38.8  Platelets 150.0 - 400.0 K/uL 287.0 294.0 272.0   No results found for: VD25OH  Clinical ASCVD:  The ASCVD Risk score Kayla Bussing DC Jr., et al., 2013) failed to calculate for the following reasons:   The valid total cholesterol range is 130 to 320 mg/dL    Social History   Tobacco Use  Smoking Status Never Smoker  Smokeless Tobacco Never Used   BP Readings from Last 3 Encounters:  07/21/20 130/82  07/04/20 134/60  06/02/20 (!) 137/57   Pulse Readings from Last 3 Encounters:  07/21/20 60  07/04/20 (!) 59  06/02/20 (!) 55   Wt Readings from Last 3 Encounters:  07/21/20 175 lb (79.4 kg)  07/04/20 175 lb (79.4 kg)  06/02/20 176 lb (79.8 kg)    Assessment: Review of patient past medical history, allergies, medications, health status, including review of consultants reports, laboratory and  other test data, was performed as part of comprehensive evaluation and provision of chronic care management services.   SDOH:  (Social Determinants of Health) assessments and interventions performed: Yes   CCM Care Plan  No Known Allergies  Medications Reviewed Today    Reviewed by Kayla Brown, [facility] (Pharmacist) on 09/30/20 at 1442  Med List Status: <None>  Medication Order Taking? Sig Documenting Provider Last Dose Status Informant  albuterol (VENTOLIN HFA) 108 (90 Base) MCG/ACT inhaler [id number]  Inhale 1-2 puffs into the lungs every 6 (six) hours as needed for wheezing or shortness of breath. [provider]  Active   Alcohol  Swabs (ALCOHOL PREP) PADS [id number]  Pt uses an alcohol pad each time sugars are tested. Pt tests twice daily. Dx E11.9 Kayla Minium, MD  Active Spouse/Significant Other  amLODipine (NORVASC) 10 MG tablet [id number] Yes TAKE 1 TABLET BY MOUTH EVERY DAY Kayla Minium, MD Taking Active   atorvastatin (LIPITOR) 40 MG tablet [id number] Yes TAKE 1 TABLET BY MOUTH EVERY DAY Kayla Minium, MD Taking Active   Calcium Carb-Cholecalciferol [phone] MG-UNIT CAPS [id number] Yes Take 1 tablet by mouth 2 (two) times daily. [provider] Taking Active   carvedilol (COREG) 25 MG tablet [id number] Yes Take 1 tablet (25 mg total) by mouth 2 (two) times daily. [name], Kayla M, MD Taking Active   dapagliflozin propanediol (FARXIGA) 10 MG TABS tablet [id number] No Take 1 tablet (10 mg total) by mouth daily.  Patient not taking: Reported on 09/30/2020   Shamleffer, Melanie Crazier, MD Not Taking Active   fish oil-omega-3 fatty acids 1000 MG capsule [id number]  Take 1 g by mouth daily. [provider]  Active Spouse/Significant Other  fluticasone (FLONASE) 50 MCG/ACT nasal spray [id number]  SPRAY 2 SPRAYS INTO EACH NOSTRIL EVERY DAY  Patient taking differently: Place 2 sprays into both nostrils daily.   Kayla Minium, MD  Active   furosemide (LASIX) 20 MG tablet [id number] Yes TAKE 1 TABLET BY MOUTH EVERY DAY Kayla Minium, MD Taking Active   gabapentin (NEURONTIN) 100 MG capsule [id number]  Take 100 mg by mouth 2 (two) times daily. [provider]  Active   glipiZIDE (GLUCOTROL) 5 MG tablet [id number] Yes Take 1 tablet (5 mg total) by mouth 2 (two) times daily before a meal. Shamleffer, Melanie Crazier, MD Taking Active   glucose blood (FREESTYLE LITE) test strip [id number] Yes USE TO TEST BLOOD GLUCOSE 2 TIMES DAILY. Dx E11.9 Kayla Minium, MD Taking Active Spouse/Significant Other  loratadine (CLARITIN) 10 MG tablet [id number] Yes TAKE 1 TABLET BY MOUTH EVERY DAY   Patient taking differently: Take 10 mg by mouth daily.   Kayla Minium, MD Taking Active   Melatonin 3 MG TABS [id number] Yes Take 10 mg by mouth at bedtime.  [provider] Taking Active Spouse/Significant Other  metFORMIN (GLUCOPHAGE-XR) 500 MG 24 hr tablet [id number] Yes Take 1 tablet (500 mg total) by mouth in the morning and at bedtime. Shamleffer, Melanie Crazier, MD Taking Active   methocarbamol (ROBAXIN) 500 MG tablet [id number] Yes Take 500 mg by mouth 2 (two) times daily. [provider] Taking Active   Multiple Vitamins-Minerals (VISION FORMULA) TABS [id number] Yes Take 1 tablet by mouth daily. [provider] Taking Active Spouse/Significant Other  OVER THE COUNTER MEDICATION [id number] Yes Take 1 tablet by mouth daily. Equate Vision Formula 50+ [provider] Taking Active Spouse/Significant Other  sertraline (ZOLOFT) 50 MG tablet [id number] Yes Take 1 tablet (50  mg total) by mouth daily. Kayla Minium, MD Taking Active   traMADol Veatrice Bourbon) 50 MG tablet [id number] Yes Take 1 tablet (50 mg total) by mouth every 6 (six) hours as needed. Aundra Dubin, PA-C Taking Active   TRUEPLUS LANCETS 30G MISC [id number] Yes Use one lancet each time sugars are tested. Pt tests twice daily. Dx. E11.9 Kayla Minium, MD Taking Active Spouse/Significant Other  UNABLE TO FIND [id number]  Med Name: Aubrey talking meter Shamleffer, Melanie Crazier, MD  Active           Patient Active Problem List   Diagnosis Date Noted  . Type 2 diabetes mellitus with stage 3a chronic kidney disease, without long-term current use of insulin ([place]) 07/21/2020  . Type 2 diabetes mellitus with hyperglycemia, with long-term current use of insulin ([place]) 04/19/2020  . Left hip pain 12/01/2019  . Type 2 diabetes mellitus with stage 3b chronic kidney disease, without long-term current use of insulin ([place]) 04/03/2019  . Diastolic CHF ([place]) 53/97/6734  . Dyspnea 03/08/2019  .  Pulmonary edema 03/04/2019  . Physical exam 02/06/2017  . Anxiety and depression 12/05/2016  . Obesity (BMI 30-39.9) 03/01/2016  . OSA (obstructive sleep apnea) 07/11/2015  . Pulmonary hypertension ([place]) 05/06/2015  . Heart murmur 03/03/2015  . Essential hypertension, benign 03/18/2014  . Hyperlipidemia 03/18/2014  . Post-menopause on HRT (hormone replacement therapy) 03/18/2014    Immunization History  Administered Date(s) Administered  . Fluad Quad(high Dose 65+) 02/16/2019, 03/28/2020  . Influenza,inj,Quad PF,6+ Mos 04/24/2016, 03/04/2017, 03/20/2018  . PFIZER(Purple Top)SARS-COV-2 Vaccination 09/24/2019, 10/15/2019, 04/25/2020  . Pneumococcal Conjugate-13 07/06/2016  . Pneumococcal Polysaccharide-23 07/01/2017  . Tdap 09/23/2009    Conditions to be addressed/monitored: Hypertension, Hyperlipidemia, Diabetes, Heart Failure and Chronic Kidney Disease  Care Plan : [place]  Updates made by Kayla Brown, [facility] since 09/30/2020 12:00 AM    Problem: Hypertension, Hyperlipidemia, Diabetes, Heart Failure and Chronic Kidney Disease   Priority: High    Long-Range Goal: Disease Management   Start Date: 08/25/2020  Expected End Date: 08/25/2021  This Visit's Progress: On track  Recent Progress: On track  Priority: High  Note:   Current Barriers:  . Unable to independently afford treatment regimen  Pharmacist Clinical Goal(s):  [name] Patient will contact provider office for questions/concerns as evidenced notation of same in electronic health record through collaboration with PharmD and provider.   Interventions: . 1:1 collaboration with Kayla Minium, MD regarding development and update of comprehensive plan of care as evidenced by provider attestation and co-signature . Inter-disciplinary care team collaboration (see longitudinal plan of care) . Comprehensive medication review performed; medication list updated in electronic medical record  CHF (minimize fluid  retention, ensure daily weights performed and documented) Has been very diligent with daily weights. Denies weight gain in 3lb/5lb limits, reports largest gain being 2 lbs last week which then resolved. States baseline ~172.5 lbs, almost never needing to take additional furosemide 20 mg tablet.  -Controlled -Current treatment:  Furosemide 20 mg once daily (PCP) -Daily weights reported: 4/8 173.4 4/7 174.2 4/6 174.6 4/5 172.6 Recommended to continue current medication  Hypertension (BP goal <130/80) -Controlled  -CKD 3b, dCHF. Pulmonary HTN, OSA on CPAP -Current treatment: . Amlodipine 10 mg once daily (PCP) . Carvedilol 25 mg twice daily (Dr [name]) . Furosemide 20 mg once daily (PCP) -Previously on RAASi therapy with spironolactone and losartan but was stopped after developing AKI.  -Current home readings: reports they only test on occasion, most  recently 4/1 132/62 50  3/7 124/60 61  -Denies hypotensive/hypertensive symptoms Daily salt intake goal < 2300 mg; Recommended to continue current medication  Diabetes (A1c goal <7%) Farxiga patient assistance shipment still has not been received. Pt's husband talked with patient assistance representative on Tuesday 4/5 stating they would receive Farxiga in 2-63 days via certified mail. There have been multiple occasions where delivery provider has confirmed delivery with front desk at patient's apartment but patient did not end up receiving. They are in the process of filing a formal complaint with fedex. -Controlled -GFRs 40s (CKD 3b) -Current medications: . Farxiga 10 mg once daily . Metformin XR 500 mg twice daily . Glipizide 5 mg once daily  -Exercise: walks to the mail box, gets out and walks often; not a formal exercise plan.  -Current dietary habits: really trying to watch the carbs - fried foods, starches. Portion sizes also something she is trying to be mindful of.  Trying to eat more fruit like apples, carrots. Will have one  soda most days. -Current home FBGs (while temporarily off of Farxiga): 4/3 120 4/4 117 4/5 114 4/6 185 (believes this was due to sinus infection) 4/7 119 4/8 117  -Denies hypoglycemic/hyperglycemic symptoms Recommended to continue current medication Encouraged patient   Osteopenia (Goal: Ensure appropriate supplementation) -Controlled  -Long term SSRI use, furosemide   -Last DEXA Scan: 04/2015->12/2018  T-Score femoral neck: -1.6 -> -2.3  T-Score total hip: -0.6 -> -1.1  T-Score lumbar spine: 1.1-> 0.8  T-Score forearm radius: n/a  10-year probability of major osteoporotic fracture: 10.7%  10-year probability of hip fracture: 1.9% -Patient is not a candidate for pharmacologic treatment -Current treatment  . Calcium-Vitamin D3 supplementation twice daily  -Recommend weight-bearing and muscle strengthening exercises for building and maintaining bone density. -Recommended to continue current medication Consider DEXA repeat ~12/2020, next PCP visit 10/2020    Patient Goals/Self-Care Activities . Patient will:  - engage in dietary modifications by reducing intake fried, processed, carb rich foods   Follow Up Plan: [place] f/u telephone visit 2 months  Medication Assistance: Will monitor progress with [place] shipment. Could consider getting order sent to endocrinology office if permitted by program and provider.  Patient's preferred pharmacy is:  [id number] [place] [place]Phone: [phone]: [phone] Uses pill box? Yes Pt endorses 100% compliance  Follow Up:  Patient agrees to Care Plan and Follow-up.  Future Appointments  Date Time Provider DEmmons 10/26/2020 10:00 AM [facility]  11/18/2020  2:00 PM TMidge Minium MD [facility]  12/01/2020 11:00 AM [facility] CCM PHARMACIST [facility]  12/19/2020  8:00 AM JMartinique Kayla M, MD [facility] [place] 01/18/2021   8:10 AM Shamleffer, IMelanie Crazier MD [facility]-LBENDO None  07/04/2021  9:00 AM YDeneise Lever MD LBPU-PULCARE None    JMadelin Brown Pharm.D., BCGP Clinical Pharmacist [facility] [phone]

## 2020-09-30 NOTE — Patient Instructions (Addendum)
Kayla Brown,  Thank you for talking with me today. I have included our care plan/goals in the following pages.   Please contact me or endocrinology if delivery issues persist with the [place]! Another option could be having the medication sent to the provider's office.   Thanks! Kayla Brown, Pharm.D., BCGP Clinical Pharmacist [facility]  [phone]  Patient Care Plan: CCM Pharmacy Care Plan    Problem Identified: Hypertension, Hyperlipidemia, Diabetes, Heart Failure and Chronic Kidney Disease   Priority: High    Long-Range Goal: Disease Management   Start Date: 08/25/2020  Expected End Date: 08/25/2021  This Visit's Progress: On track  Recent Progress: On track  Priority: High  Note:   Current Barriers:  . Unable to independently afford treatment regimen  Pharmacist Clinical Goal(s):  [name] Patient will contact provider office for questions/concerns as evidenced notation of same in electronic health record through collaboration with PharmD and provider.   Interventions: . 1:1 collaboration with Kayla Hatch, MD regarding development and update of comprehensive plan of care as evidenced by provider attestation and co-signature . Inter-disciplinary care team collaboration (see longitudinal plan of care) . Comprehensive medication review performed; medication list updated in electronic medical record  CHF (minimize fluid retention, ensure daily weights performed and documented) Has been very diligent with daily weights. Denies weight gain in 3lb/5lb limits, reports largest gain being 2 lbs last week which then resolved. States baseline ~172.5 lbs, almost never needing to take additional furosemide 20 mg tablet.  -Controlled -Current treatment:  Furosemide 20 mg once daily (PCP) -Daily weights reported: 4/8 173.4 4/7 174.2 4/6 174.6 4/5 172.6 Recommended to continue current medication  Hypertension (BP goal <130/80) -Controlled   -CKD 3b, dCHF. Pulmonary HTN, OSA on CPAP -Current treatment: . Amlodipine 10 mg once daily (PCP) . Carvedilol 25 mg twice daily (Dr [place]) . Furosemide 20 mg once daily (PCP) -Previously on RAASi therapy with spironolactone and losartan but was stopped after developing AKI.  -Current home readings: reports they only test on occasion, most recently 4/1 132/62 50  3/7 124/60 61  -Denies hypotensive/hypertensive symptoms Daily salt intake goal < 2300 mg; Recommended to continue current medication  Diabetes (A1c goal <7%) Farxiga patient assistance shipment still has not been received. Pt's husband talked with patient assistance representative on Tuesday 4/5 stating they would receive Farxiga in 7-10 days via certified mail. There have been multiple occasions where delivery provider has confirmed delivery with front desk at patient's apartment but patient did not end up receiving. They are in the process of filing a formal complaint with fedex. -Controlled -GFRs 40s (CKD 3b) -Current medications: . Farxiga 10 mg once daily . Metformin XR 500 mg twice daily . Glipizide 5 mg once daily  -Exercise: walks to the mail box, gets out and walks often; not a formal exercise plan.  -Current dietary habits: really trying to watch the carbs - fried foods, starches. Portion sizes also something she is trying to be mindful of.  Trying to eat more fruit like apples, carrots. Will have one soda most days. -Current home FBGs (while temporarily off of Farxiga): 4/3 120 4/4 117 4/5 114 4/6 185 (believes this was due to sinus infection) 4/7 119 4/8 117  -Denies hypoglycemic/hyperglycemic symptoms Recommended to continue current medication Encouraged patient   Osteopenia (Goal: Ensure appropriate supplementation) -Controlled  -Long term SSRI use, furosemide   -Last DEXA Scan: 04/2015->12/2018  T-Score femoral neck: -1.6 -> -2.3  T-Score total hip: -0.6 -> -1.1  T-Score lumbar spine: 1.1->  0.8  T-Score forearm radius: n/a  10-year probability of major osteoporotic fracture: 10.7%  10-year probability of hip fracture: 1.9% -Patient is not a candidate for pharmacologic treatment -Current treatment  . Calcium-Vitamin D3 supplementation twice daily  -Recommend weight-bearing and muscle strengthening exercises for building and maintaining bone density. -Recommended to continue current medication Consider DEXA repeat ~12/2020, next PCP visit 10/2020         The patient verbalized understanding of instructions provided today and agreed to receive a MyChart copy of patient instruction and/or educational materials. Telephone follow up appointment with pharmacy team member scheduled for: See next appointment with "Care Management Staff" under "What's Next" below.

## 2020-10-14 ENCOUNTER — Other Ambulatory Visit: Payer: Self-pay | Admitting: Family Medicine

## 2020-10-14 DIAGNOSIS — F32A Depression, unspecified: Secondary | ICD-10-CM

## 2020-10-14 DIAGNOSIS — F419 Anxiety disorder, unspecified: Secondary | ICD-10-CM

## 2020-10-14 NOTE — Telephone Encounter (Signed)
Patient is requesting a refill of the following medications: Requested Prescriptions   Pending Prescriptions Disp Refills  . sertraline (ZOLOFT) 50 MG tablet [Pharmacy Med Name: SERTRALINE HCL 50 MG TABLET] 90 tablet 1    Sig: TAKE 1 TABLET BY MOUTH EVERY DAY    Date of patient request:4/22 Last office visit: 05/23/2020 Date of last refill: 07/07/2020  Last refill amount: 90 tablets  Follow up time period per chart: 11/18/2020  Patient not due for an refill

## 2020-10-17 DIAGNOSIS — M4316 Spondylolisthesis, lumbar region: Secondary | ICD-10-CM | POA: Diagnosis not present

## 2020-10-21 ENCOUNTER — Ambulatory Visit: Payer: Medicare Other | Admitting: *Deleted

## 2020-10-26 ENCOUNTER — Ambulatory Visit (INDEPENDENT_AMBULATORY_CARE_PROVIDER_SITE_OTHER): Payer: Medicare Other

## 2020-10-26 DIAGNOSIS — E1122 Type 2 diabetes mellitus with diabetic chronic kidney disease: Secondary | ICD-10-CM

## 2020-10-26 DIAGNOSIS — N1832 Chronic kidney disease, stage 3b: Secondary | ICD-10-CM | POA: Diagnosis not present

## 2020-10-26 DIAGNOSIS — I10 Essential (primary) hypertension: Secondary | ICD-10-CM | POA: Diagnosis not present

## 2020-10-26 DIAGNOSIS — I5032 Chronic diastolic (congestive) heart failure: Secondary | ICD-10-CM | POA: Diagnosis not present

## 2020-10-26 NOTE — Chronic Care Management (AMB) (Signed)
Chronic Care Management   CCM RN Visit Note  10/26/2020 Name: Kayla Brown MRN: [id number] DOB: 17-Dec-1950  Subjective: Kayla Brown is a 70 y.o. year old female who is a primary care patient of [name], [name], MD. The care management team was consulted for assistance with disease management and care coordination needs.    Engaged with patient by telephone for follow up visit in response to provider referral for case management and/or care coordination services.   Consent to Services:  The patient was given information about Chronic Care Management services, agreed to services, and gave verbal consent prior to initiation of services.  Please see initial visit note for detailed documentation.   Patient agreed to services and verbal consent obtained.   Assessment: Review of patient past medical history, allergies, medications, health status, including review of consultants reports, laboratory and other test data, was performed as part of comprehensive evaluation and provision of chronic care management services.   SDOH (Social Determinants of Health) assessments and interventions performed:    CCM Care Plan  No Known Allergies  Outpatient Encounter Medications as of 10/26/2020  Medication Sig  . albuterol (VENTOLIN HFA) 108 (90 Base) MCG/ACT inhaler Inhale 1-2 puffs into the lungs every 6 (six) hours as needed for wheezing or shortness of breath.  . Alcohol Swabs (ALCOHOL PREP) PADS Pt uses an alcohol pad each time sugars are tested. Pt tests twice daily. Dx E11.9  . amLODipine (NORVASC) 10 MG tablet TAKE 1 TABLET BY MOUTH EVERY DAY  . atorvastatin (LIPITOR) 40 MG tablet TAKE 1 TABLET BY MOUTH EVERY DAY  . Calcium Carb-Cholecalciferol [phone] MG-UNIT CAPS Take 1 tablet by mouth 2 (two) times daily.  . carvedilol (COREG) 25 MG tablet Take 1 tablet (25 mg total) by mouth 2 (two) times daily.  . dapagliflozin propanediol (FARXIGA) 10 MG TABS tablet Take 1 tablet (10 mg total) by mouth  daily. (Patient not taking: Reported on 09/30/2020)  . fish oil-omega-3 fatty acids 1000 MG capsule Take 1 g by mouth daily.  . fluticasone (FLONASE) 50 MCG/ACT nasal spray SPRAY 2 SPRAYS INTO EACH NOSTRIL EVERY DAY (Patient taking differently: Place 2 sprays into both nostrils daily.)  . furosemide (LASIX) 20 MG tablet TAKE 1 TABLET BY MOUTH EVERY DAY  . gabapentin (NEURONTIN) 100 MG capsule Take 100 mg by mouth 2 (two) times daily.  [name] glipiZIDE (GLUCOTROL) 5 MG tablet Take 1 tablet (5 mg total) by mouth 2 (two) times daily before a meal.  . glucose blood (FREESTYLE LITE) test strip USE TO TEST BLOOD GLUCOSE 2 TIMES DAILY. Dx E11.9  . loratadine (CLARITIN) 10 MG tablet TAKE 1 TABLET BY MOUTH EVERY DAY (Patient taking differently: Take 10 mg by mouth daily.)  . Melatonin 3 MG TABS Take 10 mg by mouth at bedtime.   . metFORMIN (GLUCOPHAGE-XR) 500 MG 24 hr tablet Take 1 tablet (500 mg total) by mouth in the morning and at bedtime.  . methocarbamol (ROBAXIN) 500 MG tablet Take 500 mg by mouth 2 (two) times daily.  . Multiple Vitamins-Minerals (VISION FORMULA) TABS Take 1 tablet by mouth daily.  [name] OVER THE COUNTER MEDICATION Take 1 tablet by mouth daily. Equate Vision Formula 50+  . sertraline (ZOLOFT) 50 MG tablet TAKE 1 TABLET BY MOUTH EVERY DAY  . traMADol (ULTRAM) 50 MG tablet Take 1 tablet (50 mg total) by mouth every 6 (six) hours as needed.  . TRUEPLUS LANCETS 30G MISC Use one lancet each time sugars are tested. Pt tests  twice daily. Dx. E11.9  . UNABLE TO FIND Med Name: Healthwise talking meter   No facility-administered encounter medications on file as of 10/26/2020.    Patient Active Problem List   Diagnosis Date Noted  . Type 2 diabetes mellitus with stage 3a chronic kidney disease, without long-term current use of insulin ([place]) 07/21/2020  . Type 2 diabetes mellitus with hyperglycemia, with long-term current use of insulin ([place]) 04/19/2020  . Left hip pain 12/01/2019  . Type 2 diabetes  mellitus with stage 3b chronic kidney disease, without long-term current use of insulin ([place]) 04/03/2019  . Diastolic CHF ([place]) 53/97/6734  . Dyspnea 03/08/2019  . Pulmonary edema 03/04/2019  . Physical exam 02/06/2017  . Anxiety and depression 12/05/2016  . Obesity (BMI 30-39.9) 03/01/2016  . OSA (obstructive sleep apnea) 07/11/2015  . Pulmonary hypertension ([place]) 05/06/2015  . Heart murmur 03/03/2015  . Essential hypertension, benign 03/18/2014  . Hyperlipidemia 03/18/2014  . Post-menopause on HRT (hormone replacement therapy) 03/18/2014    Conditions to be addressed/monitored:CHF, HTN, DMII and CKD Stage 3  Care Plan : RNCM:Chronic Kidney (Adult)  Updates made by [name], RN since 10/26/2020 12:00 AM    Problem: RNCM: Need for long term self management plan for chronic kidney disease   Priority: Medium    Long-Range Goal: RNCM : Disease Progression Prevented or Minimized   Start Date: 08/10/2020  Expected End Date: 04/24/2021  This Visit's Progress: On track  Recent Progress: On track  Priority: Medium  Note:   Current Barriers:  [name] Knowledge Deficits related to Chronic kidney disease stage 3  now being followed by nephrology reports next appt with Dr.Upton 12/23/20, reports B/P readings 120-130/60-80 . Does not adhere to provider recommendations re: diet and fluids recommendations at times-reports avoiding dark sodas and drinking water instead  Nurse Case Manager Clinical Goal(s):  . patient will verbalize understanding of plan for self management of chronic kidney disease . patient will work with CCM team and primary care provider to address needs related to chronic kidney disease . patient will attend all scheduled medical appointments: Dr. Birdie Riddle 11/18/20, Dr. Kelton Pillar 01/18/21  Dr.Upton 12/23/20 . patient will verbalize basic understanding of chronic kidney disease process and self health management plan as evidenced by no worsening of kidney function, adherence to  diet and fluid recommendations  . patient will work with CM team pharmacist to self manage medications . the patient will demonstrate ongoing self health care management ability as evidenced by adherence to plan of care, medications, diet and provider appts, call providers with problems or concerns*  Interventions:  . 1:1 collaboration with [name], MD regarding development and update of comprehensive plan of care as evidenced by provider attestation and co-signature . Inter-disciplinary care team collaboration (see longitudinal plan of care) . Evaluation of current treatment plan related to chronic kidney disease and patient's adherence to plan as established by provider. . Reinforced education to patient re: chronic kidney disease self management . Reviewed importance of keeping blood sugar and B/P under control to help prevent further kidney damage . Reviewed medications with patient and discussed to call CCM team for any problems or concerns . Reviewed written educational materials related to chronic kidney disease . Encouraged to discuss with Dr.Upton about use of SGLT2 inhibitors and which one is covered in her plan D formulary  . Reviewed scheduled/upcoming provider appointments including: Dr. Birdie Riddle 11/18/20, Dr. Kelton Pillar 01/18/21, [facility] in 12/23/20, Dr. [name] cardiology 12/19/20 . Discussed plans with patient  for ongoing care management follow up and provided patient with direct contact information for care management team  Patient Goals/Self-Care Activities Over the next 120 days, patient will:  - Patient will self administer medications as prescribed Patient will attend all scheduled provider appointments Patient will call pharmacy for medication refills Patient will call provider office for new concerns or questions -try walking 3-4 times a week and gradually increase time and number of days  -follow diet and fluid recommendations from kidney  doctor  Follow Up Plan: Telephone follow up appointment with care management team member scheduled for: 12/23/20 at 10 AM       Care Plan : RNCM:Heart Failure (Adult)  Updates made by [name], RN since 10/26/2020 12:00 AM    Problem: RNCM: Knowledge deficit related to long term self management of heart failure   Priority: High    Long-Range Goal: Effective self mangement of heart failure   Start Date: 08/10/2020  Expected End Date: 04/24/2021  This Visit's Progress: On track  Recent Progress: On track  Priority: High  Note:   Current Barriers:  . Does not adhere to provider recommendations re: diet and fluid recommendations at times . Reports weighing daily with weight stable at 175 lb this morning. Reports she has not needed to take extra Lasix recently.  Denies any swelling, shortness of breath or chest pains, Reports trying to follow a low sodium diet Case Manager Clinical Goal(s):  . patient will weigh self daily and record . patient will verbalize understanding of Heart Failure Action Plan and when to call doctor . patient will take all Heart Failure mediations as prescribed . patient will weigh daily and record (notifying MD of 3 lb weight gain over night or 5 lb in a week) Interventions:  . Collaboration with [name], MD regarding development and update of comprehensive plan of care as evidenced by provider attestation and co-signature . Inter-disciplinary care team collaboration (see longitudinal plan of care) . Reviewed upcoming appointment with cardiology Dr. [name] 12/19/20 . Basic overview and discussion of pathophysiology of Heart Failure reviewed  . Reinforced verbal education on low sodium diet . Reviewed Heart Failure Action Plan in depth  . Reinforced placing scale on hard, flat surface . Advised patient to weigh each morning after emptying bladder . Discussed importance of daily weight and advised patient to weigh and record daily . Reviewed  role of diuretics in prevention of fluid overload and management of heart failure Patient Goals/Self-Care Activities - begin a heart failure diary - bring diary to all appointments - develop a rescue plan - eat more whole grains, fruits and vegetables, lean meats and healthy fats - follow rescue plan if symptoms flare-up - know when to call the doctor - track symptoms and what helps feel better or worse Follow Up Plan: Telephone follow up appointment with care management team member scheduled for: 12/28/20 at 10 AM   Care Plan : RNCM:Diabetes Type 2 (Adult)  Updates made by [name], RN since 10/26/2020 12:00 AM    Problem: RNCM: Need for effective self management of Type 2 Diabetes   Priority: Medium    Long-Range Goal: RNCM:Effective self mangement of Type 2 Diabetes   Start Date: 08/10/2020  Expected End Date: 04/24/2021  This Visit's Progress: On track  Recent Progress: On track  Priority: Medium  Note:   Objective:  Lab Results  Component Value Date   HGBA1C 6.7 (A) 07/21/2020 .   Lab Results  Component  Value Date   CREATININE 1.26 (H) 07/21/2020   CREATININE 1.24 (H) 05/23/2020   CREATININE 1.08 11/19/2019 .   [name] No results found for: EGFR Current Barriers:  [name] Knowledge Deficits related to basic Diabetes pathophysiology and self care/management . Difficulty obtaining or cannot afford medications reports currently getting patient assistance for [facility] [place], reports she still has not gotten her medication delivered and she has spoken with the patient assistance program numerous times . Does not adhere to provider recommendations re: ADA/low sodium diet and exercise at times . Reports blood sugars have been ranging from 105-185 fasting denies any hypoglycemia, reports walking apx 2 times a week weather permitting and doing exercises that PT taught her several times a week to help with her chronic low back pain Case Manager Clinical Goal(s):  . patient will demonstrate  improved adherence to prescribed treatment plan for diabetes self care/management as evidenced by: daily monitoring and recording of CBG  adherence to ADA/ carb modified diet exercise 3-4 days/week adherence to prescribed medication regimen contacting provider for new or worsened symptoms or questions Interventions:  . Collaboration with [name], MD regarding development and update of comprehensive plan of care as evidenced by provider attestation and co-signature . Inter-disciplinary care team collaboration (see longitudinal plan of care) . Reviewed education about basic DM disease process . Reviewed medications with patient and discussed importance of medication adherence . Discussed plans with patient for ongoing care management follow up and provided patient with direct contact information for care management team . Reinforced education related to hypo and hyperglycemia and importance of correct treatment . Reviewed scheduled/upcoming provider appointments including: Dr. Birdie Riddle 11/18/20, Dr. Kelton Pillar 01/18/21, Dr. Hollie Salk 12/23/20, Dr. [name] 12/19/20 . Reinforced to check cbg daily and record, calling primary care provider for findings outside established parameters.   . Review of patient status, including review of consultants reports, relevant laboratory and other test results, and medications completed. . Communicated with CCM pharmacist that pt is still having issues with delivery of her [place]  Patient Goals/Self-Care Activities - Self administers oral medications as prescribed Attends all scheduled provider appointments Checks blood sugars as prescribed and utilize hyper and hypoglycemia protocol as needed Adheres to prescribed ADA/carb modified - check blood sugar at prescribed times - check blood sugar if I feel it is too high or too low - enter blood sugar readings and medication or insulin into daily log - take the blood sugar log to all doctor visits Follow Up Plan:  Telephone follow up appointment with care management team member scheduled for: 12/28/20 at 10 AM     Plan:Telephone follow up appointment with care management team member scheduled for:  12/28/20 and The patient has been provided with contact information for the care management team and has been advised to call with any health related questions or concerns.  Peter Garter RN, BSN,CCM, CDE Care Management Coordinator [place] [phone], Mobile [phone]

## 2020-10-26 NOTE — Patient Instructions (Signed)
Visit Information  PATIENT GOALS: Goals Addressed            This Visit's Progress   . RNCM Monitor and Manage My Diabetes Type 2 and chronic kidney disease   On track    Timeframe:  Long-Range Goal Priority:  Medium Start Date:    08/10/20                         Expected End Date:     04/23/21                  Follow Up Date 12/28/2020 at 10 AM    - check blood sugar at prescribed times - check blood sugar if I feel it is too high or too low - enter blood sugar readings and medication or insulin into daily log - take the blood sugar log to all doctor visits  -try walking 3-4 times a week and gradually increase time and number of days  -follow diet and fluid recommendations from kidney doctor   Why is this important?    Checking your blood sugar at home helps to keep it from getting very high or very low.   Writing the results in a diary or log helps the doctor know how to care for you.   Your blood sugar log should have the time, date and the results.   Also, write down the amount of insulin or other medicine that you take.   Other information, like what you ate, exercise done and how you were feeling, will also be helpful.     Notes:     . RNCM:Better track and self manage heart failure symptoms   On track    Timeframe:  Long-Range Goal Priority:  High Start Date:  08/10/20                           Expected End Date: 04/24/21                      Follow Up Date 12/28/20 at 10 AM   - begin a heart failure diary - bring diary to all appointments - develop a rescue plan - eat more whole grains, fruits and vegetables, lean meats and healthy fats - follow rescue plan if symptoms flare-up - know when to call the doctor - track symptoms and what helps feel better or worse - dress right for the weather, hot or cold    Why is this important?   You will be able to handle your symptoms better if you keep track of them.  Making some simple changes to your lifestyle will  help.  Eating healthy is one thing you can do to take good care of yourself.          Cooking With Less Salt Cooking with less salt is one way to reduce the amount of sodium you get from food. Sodium is one of the elements that make up salt. It is found naturally in foods and is also added to certain foods. Depending on your condition and overall health, your health care provider or dietitian may recommend that you reduce your sodium intake. Most people should have less than 2,300 milligrams (mg) of sodium each day. If you have high blood pressure (hypertension), you may need to limit your sodium to 1,500 mg each day. Follow the tips below to help reduce your sodium intake.  What are tips for eating less sodium? Reading food labels  Check the food label before buying or using packaged ingredients. Always check the label for the serving size and sodium content.  Look for products with no more than 140 mg of sodium in one serving.  Check the % Daily Value column to see what percent of the daily recommended amount of sodium is provided in one serving of the product. Foods with 5% or less in this column are considered low in sodium. Foods with 20% or higher are considered high in sodium.  Do not choose foods with salt as one of the first three ingredients on the ingredients list. If salt is one of the first three ingredients, it usually means the item is high in sodium.   Shopping  Buy sodium-free or low-sodium products. Look for the following words on food labels: ? Low-sodium. ? Sodium-free. ? Reduced-sodium. ? No salt added. ? Unsalted.  Always check the sodium content even if foods are labeled as low-sodium or no salt added.  Buy fresh foods. Cooking  Use herbs, seasonings without salt, and spices as substitutes for salt.  Use sodium-free baking soda when baking.  Grill, braise, or roast foods to add flavor with less salt.  Avoid adding salt to pasta, rice, or hot  cereals.  Drain and rinse canned vegetables, beans, and meat before use.  Avoid adding salt when cooking sweets and desserts.  Cook with low-sodium ingredients. What foods are high in sodium? Vegetables Regular canned vegetables (not low-sodium or reduced-sodium). Sauerkraut, pickled vegetables, and relishes. Olives. [place] fries. Onion rings. Regular canned tomato sauce and paste. Regular tomato and vegetable juice. Frozen vegetables in sauces. Grains Instant hot cereals. Bread stuffing, pancake, and biscuit mixes. Croutons. Seasoned rice or pasta mixes. Noodle soup cups. Boxed or frozen macaroni and cheese. Regular salted crackers. Self-rising flour. Rolls. Bagels. Flour tortillas and wraps. Meats and other proteins Meat or fish that is salted, canned, smoked, cured, spiced, or pickled. This includes bacon, ham, sausages, hot dogs, corned beef, chipped beef, meat loaves, salt pork, jerky, pickled herring, anchovies, regular canned tuna, and sardines. Salted nuts. Dairy Processed cheese and cheese spreads. Cheese curds. Blue cheese. Feta cheese. String cheese. Regular cottage cheese. Buttermilk. Canned milk. The items listed above may not be a complete list of foods high in sodium. Actual amounts of sodium may be different depending on processing. Contact a dietitian for more information. What foods are low in sodium? Fruits Fresh, frozen, or canned fruit with no sauce added. Fruit juice. Vegetables Fresh or frozen vegetables with no sauce added. "No salt added" canned vegetables. "No salt added" tomato sauce and paste. Low-sodium or reduced-sodium tomato and vegetable juice. Grains Noodles, pasta, quinoa, rice. Shredded or puffed wheat or puffed rice. Regular or quick oats (not instant). Low-sodium crackers. Low-sodium bread. Whole-grain bread and whole-grain pasta. Unsalted popcorn. Meats and other proteins Fresh or frozen whole meats, poultry (not injected with sodium), and fish with no  sauce added. Unsalted nuts. Dried peas, beans, and lentils without added salt. Unsalted canned beans. Eggs. Unsalted nut butters. Low-sodium canned tuna or chicken. Dairy Milk. Soy milk. Yogurt. Low-sodium cheeses, such as Swiss, [address], [place], and [employer]. Sherbet or ice cream (keep to  cup per serving). Cream cheese. Fats and oils Unsalted butter or margarine. Other foods Homemade pudding. Sodium-free baking soda and baking powder. Herbs and spices. Low-sodium seasoning mixes. Beverages Coffee and tea. Carbonated beverages. The items listed above may not be a  complete list of foods low in sodium. Actual amounts of sodium may be different depending on processing. Contact a dietitian for more information. What are some salt alternatives when cooking? The following are herbs, seasonings, and spices that can be used instead of salt to flavor your food. Herbs should be fresh or dried. Do not choose packaged mixes. Next to the name of the herb, spice, or seasoning are some examples of foods you can pair it with. Herbs  Bay leaves - Soups, meat and vegetable dishes, and spaghetti sauce.  Basil - [employer], soups, pasta, and fish dishes.  Cilantro - Meat, poultry, and vegetable dishes.  Chili powder - Marinades and Mexican dishes.  Chives - Salad dressings and potato dishes.  Cumin - Mexican dishes, couscous, and meat dishes.  Dill - Fish dishes, sauces, and salads.  Fennel - Meat and vegetable dishes, breads, and cookies.  Garlic (do not use garlic salt) - [place] dishes, meat dishes, salad dressings, and sauces.  Marjoram - Soups, potato dishes, and meat dishes.  Oregano - Pizza and spaghetti sauce.  Parsley - Salads, soups, pasta, and meat dishes.  Rosemary - [place] dishes, salad dressings, soups, and red meats.  Saffron - Fish dishes, pasta, and some poultry dishes.  Sage - Stuffings and sauces.  Tarragon - Fish and [employer].  Thyme - Stuffing,  meat, and fish dishes. Seasonings  Lemon juice - Fish dishes, poultry dishes, vegetables, and salads.  Vinegar - Salad dressings, vegetables, and fish dishes. Spices  Cinnamon - Sweet dishes, such as cakes, cookies, and puddings.  Cloves - Gingerbread, puddings, and marinades for meats.  Curry - Vegetable dishes, fish and poultry dishes, and stir-fry dishes.  Ginger - Vegetable dishes, fish dishes, and stir-fry dishes.  Nutmeg - Pasta, vegetables, poultry, fish dishes, and custard. Summary  Cooking with less salt is one way to reduce the amount of sodium that you get from food.  Buy sodium-free or low-sodium products.  Check the food label before using or buying packaged ingredients.  Use herbs, seasonings without salt, and spices as substitutes for salt in foods. This information is not intended to replace advice given to you by your health care provider. Make sure you discuss any questions you have with your health care provider. Document Revised: 06/03/2019 Document Reviewed: 06/03/2019 Elsevier Patient Education  2021 Elsevier Inc.  Patient verbalizes understanding of instructions provided today and agrees to view in [place].   Telephone follow up appointment with care management team member scheduled for:12/28/20 at 10 AM  Dudley Major RN, [facility], CDE Care Management Coordinator [facility] [phone], Mobile [phone]

## 2020-11-18 ENCOUNTER — Encounter: Payer: Self-pay | Admitting: Family Medicine

## 2020-11-18 ENCOUNTER — Other Ambulatory Visit: Payer: Self-pay

## 2020-11-18 ENCOUNTER — Ambulatory Visit (INDEPENDENT_AMBULATORY_CARE_PROVIDER_SITE_OTHER): Payer: Medicare Other | Admitting: Family Medicine

## 2020-11-18 VITALS — BP 115/70 | HR 54 | Temp 98.0°F | Resp 20 | Ht 61.0 in | Wt 185.4 lb

## 2020-11-18 DIAGNOSIS — F419 Anxiety disorder, unspecified: Secondary | ICD-10-CM | POA: Diagnosis not present

## 2020-11-18 DIAGNOSIS — F32A Depression, unspecified: Secondary | ICD-10-CM

## 2020-11-18 DIAGNOSIS — I10 Essential (primary) hypertension: Secondary | ICD-10-CM | POA: Diagnosis not present

## 2020-11-18 DIAGNOSIS — M85852 Other specified disorders of bone density and structure, left thigh: Secondary | ICD-10-CM | POA: Diagnosis not present

## 2020-11-18 DIAGNOSIS — L600 Ingrowing nail: Secondary | ICD-10-CM | POA: Diagnosis not present

## 2020-11-18 DIAGNOSIS — E785 Hyperlipidemia, unspecified: Secondary | ICD-10-CM | POA: Diagnosis not present

## 2020-11-18 DIAGNOSIS — M8580 Other specified disorders of bone density and structure, unspecified site: Secondary | ICD-10-CM | POA: Diagnosis not present

## 2020-11-18 MED ORDER — BUPROPION HCL ER (XL) 150 MG PO TB24: 150.0000 mg | ORAL_TABLET | Freq: Every day | ORAL | 3 refills | Status: DC

## 2020-11-18 NOTE — Patient Instructions (Signed)
Follow up in 4-6 weeks to recheck mood/ADHD [facility] notify you of your lab results and make any changes if needed We'll call you with your podiatry referral and your bone density appt Try and work on healthy diet and regular exercise- you can do it! ADD the Wellbutrin once daily to improve mood and focus ADD a B-complex vitamin daily Take the calcium at night Call with any questions or concerns Enjoy your weekend!

## 2020-11-18 NOTE — Progress Notes (Signed)
   Subjective:    Patient ID: Kayla Brown, female    DOB: 1950/07/21, 70 y.o.   MRN: [id number]  HPI HTN- chronic problem, on Amlodipine 10mg  daily, Coreg 25mg  BID w/ good control.  No CP, SOB, HAs, visual changes, edema.  Hyperlipidemia- chronic problem, on Lipitor 40mg  daily.  Denies abd pain, N/V.  Obesity- pt has gained 10 lbs since last visit.  'i've been eating too much'.  Pt reports she is stress eating recently.    ADHD- pt is convinced she has ADHD even though she has never been diagnosed.  Reports sxs have been ongoing since childhood.  'I know I have it.  Based on everything I read'.  Pt reports she is 'learning ways to cope with it'.  Husband is helping.  Pt and her sister-in-law 'do NOT get along' and she has come back recently.  Ingrown toenail- Husband has been cutting toenails.  Husband feels it is ingrown but area is not painful. L big toe   Review of Systems For ROS see HPI   This visit occurred during the SARS-CoV-2 public health emergency.  Safety protocols were in place, including screening questions prior to the visit, additional usage of staff PPE, and extensive cleaning of exam room while observing appropriate contact time as indicated for disinfecting solutions.       Objective:   Physical Exam Vitals reviewed.  Constitutional:      General: She is not in acute distress.    Appearance: Normal appearance. She is well-developed. She is obese.  HENT:     Head: Normocephalic and atraumatic.  Eyes:     Conjunctiva/sclera: Conjunctivae normal.     Pupils: Pupils are equal, round, and reactive to light.  Neck:     Thyroid: No thyromegaly.  Cardiovascular:     Rate and Rhythm: Normal rate and regular rhythm.     Heart sounds: Normal heart sounds. No murmur heard.   Pulmonary:     Effort: Pulmonary effort is normal. No respiratory distress.     Breath sounds: Normal breath sounds.  Abdominal:     General: There is no distension.     Palpations: Abdomen is  soft.     Tenderness: There is no abdominal tenderness.  Musculoskeletal:     Cervical back: Normal range of motion and neck supple.  Lymphadenopathy:     Cervical: No cervical adenopathy.  Skin:    General: Skin is warm and dry.     Comments: L great toenail curling under w/o evidence of infxn  Neurological:     Mental Status: She is alert and oriented to person, place, and time.  Psychiatric:        Behavior: Behavior normal.           Assessment & Plan:  Ingrown toenail- refer to podiatry

## 2020-11-19 ENCOUNTER — Other Ambulatory Visit: Payer: Self-pay | Admitting: Family Medicine

## 2020-11-19 LAB — LIPID PANEL
Cholesterol: 126 mg/dL (ref <200)
HDL: 52 mg/dL (ref >=50)
LDL Cholesterol (Calc): 52 mg/dL (calc)
Non-HDL Cholesterol (Calc): 74 mg/dL (calc) (ref <130)
Total CHOL/HDL Ratio: 2.4 (calc) (ref <5.0)
Triglycerides: 141 mg/dL (ref <150)

## 2020-11-19 LAB — CBC WITH DIFFERENTIAL/PLATELET
Absolute Monocytes: 1050 cells/uL — ABNORMAL HIGH (ref 200–950)
Basophils Absolute: 74 cells/uL (ref 0–200)
Basophils Relative: 0.7 %
Eosinophils Absolute: 567 cells/uL — ABNORMAL HIGH (ref 15–500)
Eosinophils Relative: 5.4 %
HCT: 35.7 % (ref 35.0–45.0)
Hemoglobin: 11.7 g/dL (ref 11.7–15.5)
Lymphs Abs: 3308 cells/uL (ref 850–3900)
MCH: 29.8 pg (ref 27.0–33.0)
MCHC: 32.8 g/dL (ref 32.0–36.0)
MCV: 90.8 fL (ref 80.0–100.0)
MPV: 10.9 fL (ref 7.5–12.5)
Monocytes Relative: 10 %
Neutro Abs: 5502 cells/uL (ref 1500–7800)
Neutrophils Relative %: 52.4 %
Platelets: 268 10*3/uL (ref 140–400)
RBC: 3.93 10*6/uL (ref 3.80–5.10)
RDW: 12.7 % (ref 11.0–15.0)
Total Lymphocyte: 31.5 %
WBC: 10.5 10*3/uL (ref 3.8–10.8)

## 2020-11-19 LAB — BASIC METABOLIC PANEL
BUN/Creatinine Ratio: 38 (calc) — ABNORMAL HIGH (ref 6–22)
BUN: 40 mg/dL — ABNORMAL HIGH (ref 7–25)
CO2: 23 mmol/L (ref 20–32)
Calcium: 9.7 mg/dL (ref 8.6–10.4)
Chloride: 105 mmol/L (ref 98–110)
Creat: 1.06 mg/dL — ABNORMAL HIGH (ref 0.50–0.99)
Glucose, Bld: 85 mg/dL (ref 65–99)
Potassium: 4.2 mmol/L (ref 3.5–5.3)
Sodium: 140 mmol/L (ref 135–146)

## 2020-11-19 LAB — TSH: TSH: 1.19 mIU/L (ref 0.40–4.50)

## 2020-11-19 LAB — HEPATIC FUNCTION PANEL
AG Ratio: 1.8 (calc) (ref 1.0–2.5)
ALT: 20 U/L (ref 6–29)
AST: 15 U/L (ref 10–35)
Albumin: 4.1 g/dL (ref 3.6–5.1)
Alkaline phosphatase (APISO): 61 U/L (ref 37–153)
Bilirubin, Direct: 0.1 mg/dL (ref 0.0–0.2)
Globulin: 2.3 g/dL (calc) (ref 1.9–3.7)
Indirect Bilirubin: 0.2 mg/dL (calc) (ref 0.2–1.2)
Total Bilirubin: 0.3 mg/dL (ref 0.2–1.2)
Total Protein: 6.4 g/dL (ref 6.1–8.1)

## 2020-11-23 ENCOUNTER — Other Ambulatory Visit: Payer: Self-pay

## 2020-11-23 ENCOUNTER — Ambulatory Visit (INDEPENDENT_AMBULATORY_CARE_PROVIDER_SITE_OTHER): Payer: Medicare Other | Admitting: Podiatry

## 2020-11-23 DIAGNOSIS — B351 Tinea unguium: Secondary | ICD-10-CM

## 2020-11-23 DIAGNOSIS — M79674 Pain in right toe(s): Secondary | ICD-10-CM | POA: Diagnosis not present

## 2020-11-23 DIAGNOSIS — M79675 Pain in left toe(s): Secondary | ICD-10-CM | POA: Diagnosis not present

## 2020-11-23 DIAGNOSIS — N1832 Chronic kidney disease, stage 3b: Secondary | ICD-10-CM | POA: Diagnosis not present

## 2020-11-23 DIAGNOSIS — E1122 Type 2 diabetes mellitus with diabetic chronic kidney disease: Secondary | ICD-10-CM

## 2020-11-24 DIAGNOSIS — M5416 Radiculopathy, lumbar region: Secondary | ICD-10-CM | POA: Diagnosis not present

## 2020-11-24 NOTE — Assessment & Plan Note (Signed)
Chronic problem.  Tolerating Lipitor daily.  Check labs.  Adjust meds prn

## 2020-11-24 NOTE — Assessment & Plan Note (Signed)
DEXA ordered.  

## 2020-11-24 NOTE — Assessment & Plan Note (Signed)
Chronic problem.  Well controlled on Amlodipine and Coreg.  Currently asymptomatic.  No med changes at this time

## 2020-11-24 NOTE — Assessment & Plan Note (Signed)
Deteriorated.  Suspect some of pt's perceived ADHD sxs are actually untreated or undertreated anxiety.  Will add Wellbutrin to treat both sxs of attention deficit and anxiety.  Pt expressed understanding and is in agreement w/ plan.

## 2020-11-24 NOTE — Assessment & Plan Note (Signed)
Deteriorated.  Pt has gained 10 lbs since last visit.  She is aware that she has been stress eating.  She wants to get back on track and says that husband will help her do so.  Will follow.

## 2020-11-29 ENCOUNTER — Encounter: Payer: Self-pay | Admitting: Podiatry

## 2020-11-29 NOTE — Progress Notes (Signed)
  Subjective:  Patient ID: Kayla Brown, female    DOB: Jan 20, 1951,  MRN: [id number]  Chief Complaint  Patient presents with  . Nail Problem    Long thick nails    70 y.o. female returns for the above complaint.  Patient presents with thickened elongated dystrophic toenails x10.  Mild pain on palpation.  Patient states that she is diabetic with last A1c of 6.7.  She is not able to debride the nails herself.  She would like for me to do it.  She denies any other acute complaints.  Objective:  There were no vitals filed for this visit. Podiatric Exam: Vascular: dorsalis pedis and posterior tibial pulses are palpable bilateral. Capillary return is immediate. Temperature gradient is WNL. Skin turgor WNL  Sensorium: Normal Semmes Weinstein monofilament test. Normal tactile sensation bilaterally. Nail Exam: Pt has thick disfigured discolored nails with subungual debris noted bilateral entire nail hallux through fifth toenails.  Pain on palpation to the nails. Ulcer Exam: There is no evidence of ulcer or pre-ulcerative changes or infection. Orthopedic Exam: Muscle tone and strength are WNL. No limitations in general ROM. No crepitus or effusions noted. HAV  B/L.  Hammer toes 2-5  B/L. Skin: No Porokeratosis. No infection or ulcers    Assessment & Plan:   1. Pain due to onychomycosis of toenails of both feet   2. Type 2 diabetes mellitus with stage 3b chronic kidney disease, without long-term current use of insulin (HCC)     Patient was evaluated and treated and all questions answered.  Onychomycosis with pain  -Nails palliatively debrided as below. -Educated on self-care  Procedure: Nail Debridement Rationale: pain  Type of Debridement: manual, sharp debridement. Instrumentation: Nail nipper, rotary burr. Number of Nails: 10  Procedures and Treatment: Consent by patient was obtained for treatment procedures. The patient understood the discussion of treatment and procedures well. All  questions were answered thoroughly reviewed. Debridement of mycotic and hypertrophic toenails, 1 through 5 bilateral and clearing of subungual debris. No ulceration, no infection noted.  Return Visit-Office Procedure: Patient instructed to return to the office for a follow up visit 3 months for continued evaluation and treatment.  Nicholes Rough, DPM    No follow-ups on file.

## 2020-11-30 ENCOUNTER — Ambulatory Visit (INDEPENDENT_AMBULATORY_CARE_PROVIDER_SITE_OTHER): Payer: Medicare Other

## 2020-11-30 DIAGNOSIS — N1832 Chronic kidney disease, stage 3b: Secondary | ICD-10-CM | POA: Diagnosis not present

## 2020-11-30 DIAGNOSIS — I5032 Chronic diastolic (congestive) heart failure: Secondary | ICD-10-CM | POA: Diagnosis not present

## 2020-11-30 DIAGNOSIS — E1122 Type 2 diabetes mellitus with diabetic chronic kidney disease: Secondary | ICD-10-CM | POA: Diagnosis not present

## 2020-11-30 DIAGNOSIS — F419 Anxiety disorder, unspecified: Secondary | ICD-10-CM | POA: Diagnosis not present

## 2020-11-30 DIAGNOSIS — F32A Depression, unspecified: Secondary | ICD-10-CM | POA: Diagnosis not present

## 2020-11-30 NOTE — Progress Notes (Signed)
Chronic Care Management Pharmacy Note  11/30/2020 Name:  Kayla Brown MRN:  [id number] DOB:  07/08/1950  Subjective: Kayla Brown is an 70 y.o. year old female who is a primary patient of [name], [name], MD.  The CCM team was consulted for assistance with disease management and care coordination needs.    Engaged with patient by telephone for follow up visit in response to provider referral for pharmacy case management and/or care coordination services.   Consent to Services:  The patient was given information about Chronic Care Management services, agreed to services, and gave verbal consent prior to initiation of services.  Please see initial visit note for detailed documentation.   Patient Care Team: [name], MD as PCP - General (Family Medicine) [name], MD (Inactive) as Consulting Physician (Gastroenterology) [name], MD as Consulting Physician (Gynecology) [name], [facility] as Pharmacist (Pharmacist) [name], RN as Case Manager  Objective:  Lab Results  Component Value Date   CREATININE 1.06 (H) 11/18/2020   CREATININE 1.26 (H) 07/21/2020   CREATININE 1.24 (H) 05/23/2020    Lab Results  Component Value Date   HGBA1C 6.7 (A) 07/21/2020   Last diabetic Eye exam:  Lab Results  Component Value Date/Time   HMDIABEYEEXA No Retinopathy 08/01/2020 03:45 PM    Last diabetic Foot exam:  Lab Results  Component Value Date/Time   HMDIABFOOTEX completed 01/24/2016 12:00 AM        Component Value Date/Time   CHOL 126 11/18/2020 1436   TRIG 141 11/18/2020 1436   HDL 52 11/18/2020 1436   CHOLHDL 2.4 11/18/2020 1436   VLDL 46.4 (H) 05/23/2020 1431   LDLCALC 52 11/18/2020 1436   LDLDIRECT 50.0 05/23/2020 1431    Hepatic Function Latest Ref Rng & Units 11/18/2020 05/23/2020 11/19/2019  Total Protein 6.1 - 8.1 g/dL 6.4 6.6 6.9  Albumin 3.5 - 5.2 g/dL - 4.3 4.5  AST 10 - 35 U/L 15 18 13   ALT 6 - 29 U/L 20 29 16   Alk Phosphatase  39 - 117 U/L - 61 64  Total Bilirubin 0.2 - 1.2 mg/dL 0.3 0.3 0.3  Bilirubin, Direct 0.0 - 0.2 mg/dL 0.1 0.1 0.1    Lab Results  Component Value Date/Time   TSH 1.19 11/18/2020 02:36 PM   TSH 1.32 05/23/2020 02:31 PM    CBC Latest Ref Rng & Units 11/18/2020 05/23/2020 11/19/2019  WBC 3.8 - 10.8 Thousand/uL 10.5 10.0 10.4  Hemoglobin 11.7 - 15.5 g/dL 11.7 13.0 12.7  Hematocrit 35.0 - 45.0 % 35.7 39.4 37.5  Platelets 140 - 400 Thousand/uL 268 287.0 294.0    No results found for: VD25OH  Clinical ASCVD:  The ASCVD Risk score Mikey Bussing DC Jr., et al., 2013) failed to calculate for the following reasons:   The valid total cholesterol range is 130 to 320 mg/dL   Social History   Tobacco Use  Smoking Status Never Smoker  Smokeless Tobacco Never Used   BP Readings from Last 3 Encounters:  11/18/20 115/70  07/21/20 130/82  07/04/20 134/60   Pulse Readings from Last 3 Encounters:  11/18/20 (!) 54  07/21/20 60  07/04/20 (!) 59   Wt Readings from Last 3 Encounters:  11/18/20 185 lb 6.4 oz (84.1 kg)  07/21/20 175 lb (79.4 kg)  07/04/20 175 lb (79.4 kg)    Assessment: Review of patient past medical history, allergies, medications, health status, including review of consultants reports, laboratory and other test data, was performed as  part of comprehensive evaluation and provision of chronic care management services.   SDOH:  (Social Determinants of Health) assessments and interventions performed:    CCM Care Plan  No Known Allergies  Medications Reviewed Today     Reviewed by Felipa Furnace, DPM (Physician) on 11/29/20 at 0757  Med List Status: <None>   Medication Order Taking? Sig Documenting Provider Last Dose Status Informant  albuterol (VENTOLIN HFA) 108 (90 Base) MCG/ACT inhaler [id number] No Inhale 1-2 puffs into the lungs every 6 (six) hours as needed for wheezing or shortness of breath. [provider] Taking Active   Alcohol Swabs (ALCOHOL PREP) PADS [id number]  No Pt uses an alcohol pad each time sugars are tested. Pt tests twice daily. Dx E11.9 [name], MD Taking Active Spouse/Significant Other  amLODipine (NORVASC) 10 MG tablet [id number] No TAKE 1 TABLET BY MOUTH EVERY DAY [name], MD Taking Active   atorvastatin (LIPITOR) 40 MG tablet [id number]  TAKE 1 TABLET BY MOUTH EVERY DAY [name], MD  Active   buPROPion (WELLBUTRIN XL) 150 MG 24 hr tablet [id number]  Take 1 tablet (150 mg total) by mouth daily. [name], MD  Active   Calcium Carb-Cholecalciferol [phone] MG-UNIT CAPS [id number] No Take 1 tablet by mouth 2 (two) times daily. [provider] Taking Active   carvedilol (COREG) 25 MG tablet [id number] No Take 1 tablet (25 mg total) by mouth 2 (two) times daily. [name], [name], MD Taking Active   fish oil-omega-3 fatty acids 1000 MG capsule [id number] No Take 1 g by mouth daily. [provider] Taking Active Spouse/Significant Other  fluticasone (FLONASE) 50 MCG/ACT nasal spray [id number] No SPRAY 2 SPRAYS INTO EACH NOSTRIL EVERY DAY  Patient taking differently: Place 2 sprays into both nostrils daily.   [name], MD Taking Active   furosemide (LASIX) 20 MG tablet [id number]  TAKE 1 TABLET BY MOUTH EVERY DAY [name], MD  Active   gabapentin (NEURONTIN) 100 MG capsule [id number] No Take 100 mg by mouth 2 (two) times daily. [provider] Taking Active   glipiZIDE (GLUCOTROL) 5 MG tablet [id number] No Take 1 tablet (5 mg total) by mouth 2 (two) times daily before a meal. Shamleffer, Melanie Crazier, MD Taking Active   glucose blood (FREESTYLE LITE) test strip [id number] No USE TO TEST BLOOD GLUCOSE 2 TIMES DAILY. Dx E11.9 [name], MD Taking Active Spouse/Significant Other  loratadine (CLARITIN) 10 MG tablet [id number] No TAKE 1 TABLET BY MOUTH EVERY DAY  Patient taking differently: Take 10 mg by mouth daily.   [name], MD Taking Active    Melatonin 3 MG TABS [id number] No Take 10 mg by mouth at bedtime.  [provider] Taking Active Spouse/Significant Other  metFORMIN (GLUCOPHAGE-XR) 500 MG 24 hr tablet [id number] No Take 1 tablet (500 mg total) by mouth in the morning and at bedtime. Shamleffer, Melanie Crazier, MD Taking Active   methocarbamol (ROBAXIN) 500 MG tablet [id number] No Take 500 mg by mouth 2 (two) times daily. [provider] Taking Active   Multiple Vitamins-Minerals (VISION FORMULA) TABS [id number] No Take 1 tablet by mouth daily. [provider] Taking Active Spouse/Significant Other  OVER THE COUNTER MEDICATION [id number] No Take 1 tablet by mouth daily. Equate Vision Formula 50+ [provider] Taking Active Spouse/Significant Other  sertraline (ZOLOFT) 50 MG tablet [id number] No TAKE 1 TABLET BY MOUTH EVERY DAY [name], MD Taking Active   traMADol (  ULTRAM) 50 MG tablet [id number] No Take 1 tablet (50 mg total) by mouth every 6 (six) hours as needed. Aundra Dubin, PA-C Taking Active   TRUEPLUS LANCETS 30G MISC [id number] No Use one lancet each time sugars are tested. Pt tests twice daily. Dx. E11.9 [name], MD Taking Active Spouse/Significant Other  UNABLE TO FIND [id number] No Med Name: [place] talking meter Shamleffer, Melanie Crazier, MD Taking Active             Patient Active Problem List   Diagnosis Date Noted   Morbid obesity ([place]) 11/18/2020   Osteopenia 11/18/2020   Type 2 diabetes mellitus with stage 3a chronic kidney disease, without long-term current use of insulin ([place]) 07/21/2020   Type 2 diabetes mellitus with hyperglycemia, with long-term current use of insulin ([place]) 04/19/2020   Lumbar radiculopathy 03/02/2020   Left hip pain 12/01/2019   Type 2 diabetes mellitus with stage 3b chronic kidney disease, without long-term current use of insulin ([place]) 02/63/7858   Diastolic CHF ([place]) 85/07/7739   Dyspnea 03/08/2019   Pulmonary edema  03/04/2019   Physical exam 02/06/2017   Anxiety and depression 12/05/2016   Obesity (BMI 30-39.9) 03/01/2016   OSA (obstructive sleep apnea) 07/11/2015   Pulmonary hypertension ([place]) 05/06/2015   Heart murmur 03/03/2015   Essential hypertension, benign 03/18/2014   Hyperlipidemia 03/18/2014   Post-menopause on HRT (hormone replacement therapy) 03/18/2014    Immunization History  Administered Date(s) Administered   Fluad Quad(high Dose 65+) 02/16/2019, 03/28/2020   Influenza,inj,Quad PF,6+ Mos 04/24/2016, 03/04/2017, 03/20/2018   PFIZER(Purple Top)SARS-COV-2 Vaccination 09/24/2019, 10/15/2019, 04/25/2020   Pneumococcal Conjugate-13 07/06/2016   Pneumococcal Polysaccharide-23 07/01/2017   Tdap 09/23/2009    Conditions to be addressed/monitored: CHF, HTN, HLD, Hypertriglyceridemia, DMII, CKD Stage 3-4, Anxiety, and Depression  Care Plan : [place]  Updates made by [name], [place] since 12/04/2020 12:00 AM     Problem: Hypertension, Hyperlipidemia, Diabetes, Heart Failure and Chronic Kidney Disease   Priority: High     Long-Range Goal: Disease Management   Start Date: 08/25/2020  Expected End Date: 08/25/2021  Recent Progress: On track  Priority: High  Note:   CHF: (goal: minimize symptoms, ensure appropriate medication use) -Controlled -Continues with daily BP and weights, keeps log. Watches salt intake Reports baseline weight ~176 lb. Denies frequent weight gain >3lb, had taken additional furosemide dose just once over past two months. -Current treatment: Furosemide 20 mg once daily Carvedilol 25 mg twice daily  -Educated on appropriate use of furosemide -Counseled on diet and exercise extensively Recommended to continue current medication  Depression (Goal: minimize symptoms and medication related side effects) -Controlled -Recently started on bupropion for depression and to possibly help with untreated symptoms of inattentiveness/ADHD. Had experienced a  sensation of jitteriness and felt out of it and is currently holding bupropion. Had received higher dose of steroid injection for OA pain around the same time and may consider a retrial  of bupropion now that some time has passed.  -Current treatment: Sertraline 50 mg once daily Bupropion 150 mg XL once daily  -PHQ9: did not obtain -Educated on Benefits of medication for symptom control Reviewed side effects of bupropion at length -Counseled on diet and exercise extensively Recommended to continue current medication  Diabetes (A1c goal <7%) -Not ideally controlled -Has not been able to continue [place] due to issues with patient not receiving shipment at her apt. Multiple providers and patient have reached out to clarify with company without resolution. May  think about asking endocrinology to have shipment sent to practice. -Current medications: Glipizide 5 mg twice daily before a meal Metformin XR 500 mg twice daily  -Medications previously tried:   -Current home glucose readings: 344, 271, 344 - day of shot. Next day 141, 179, 211. Yesterday: 143 in am -Denies hypoglycemic/hyperglycemic symptoms -Current meal patterns: low carb diet, has minimized intake of processed foods.  -Current exercise: walks around apartment -Educated on A1c and blood sugar goals; Benefits of routine self-monitoring of blood sugar; -Counseled on diet and exercise extensively Recommended to continue current medication     Medication Assistance: None required.  Patient affirms current coverage meets needs.  Patient's preferred pharmacy is:  [id number] [id number]: [phone]: [phone] Follow Up:  Patient agrees to Care Plan and Follow-up.  Plan:  1 [place] scheduled  Future Appointments  Date Time Provider [place] 12/19/2020  8:00 AM J[place]ique [name], MD [facility] 12/28/2020   8:30 AM TMidge Minium MD [facility]  12/28/2020 10:00 AM [facility] SF-CCM CARE MGR [facility]  01/11/2021  9:00 AM [facility] CCM PHARMACIST [facility]  01/18/2021  8:10 AM Shamleffer, IMelanie Crazier MD [facility] None  02/03/2021  9:15 AM MGardiner Barefoot DPM [facility]  07/04/2021  9:00 AM YDeneise Lever MD [facility] None

## 2020-12-01 ENCOUNTER — Telehealth: Payer: Medicare Other

## 2020-12-01 DIAGNOSIS — H10412 Chronic giant papillary conjunctivitis, left eye: Secondary | ICD-10-CM | POA: Diagnosis not present

## 2020-12-04 NOTE — Patient Instructions (Signed)
Kayla Brown,  Thank you for talking with me today. I have included our care plan/goals in the following pages.   Please review and call me at [phone] with any questions.  Thanks! [name], PharmD, CPP Clinical Pharmacist Practitioner  [facility]  [phone]  Care Plan : CCM Pharmacy Care Plan  Updates made by Dahlia Byes, [facility] since 12/04/2020 12:00 AM     Problem: Hypertension, Hyperlipidemia, Diabetes, Heart Failure and Chronic Kidney Disease   Priority: High     Long-Range Goal: Disease Management   Start Date: 08/25/2020  Expected End Date: 08/25/2021  This Visit's Progress: On track  Recent Progress: On track  Priority: High  Note:   Current Barriers:  Unable to independently afford treatment regimen  Pharmacist Clinical Goal(s):  Patient will contact provider office for questions/concerns as evidenced notation of same in electronic health record through collaboration with PharmD and provider.   Interventions: 1:1 collaboration with Sheliah Hatch, MD regarding development and update of comprehensive plan of care as evidenced by provider attestation and co-signature Inter-disciplinary care team collaboration (see longitudinal plan of care) Comprehensive medication review performed; medication list updated in electronic medical record  CHF: (goal: minimize symptoms, ensure appropriate medication use) -Controlled -Continues with daily BP and weights, keeps log. Watches salt intake Reports baseline weight ~176 lb. Denies frequent weight gain >3lb, had taken additional furosemide dose just once over past two months. -Current treatment: Furosemide 20 mg once daily Carvedilol 25 mg twice daily  -Educated on appropriate use of furosemide -Counseled on diet and exercise extensively Recommended to continue current medication  Depression (Goal: minimize symptoms and medication related side effects) -Controlled -Recently started on  bupropion for depression and to possibly help with untreated symptoms of inattentiveness/ADHD. Had experienced a sensation of jitteriness and felt out of it and is currently holding bupropion. Had received higher dose of steroid injection for OA pain around the same time and may consider a retrial  of bupropion now that some time has passed.  -Current treatment: Sertraline 50 mg once daily Bupropion 150 mg XL once daily  -PHQ9: did not obtain -Educated on Benefits of medication for symptom control Reviewed side effects of bupropion at length -Counseled on diet and exercise extensively Recommended to continue current medication  Diabetes (A1c goal <7%) -Not ideally controlled -Has not been able to continue [place] due to issues with patient not receiving shipment at her apt. Multiple providers and patient have reached out to clarify with company without resolution. May think about asking endocrinology to have shipment sent to practice. -Current medications: Glipizide 5 mg twice daily before a meal Metformin XR 500 mg twice daily  -Medications previously tried:   -Current home glucose readings: 344, 271, 344 - day of shot. Next day 141, 179, 211. Yesterday: 143 in am -Denies hypoglycemic/hyperglycemic symptoms -Current meal patterns: low carb diet, has minimized intake of processed foods.  -Current exercise: walks around apartment -Educated on A1c and blood sugar goals; Benefits of routine self-monitoring of blood sugar; -Counseled on diet and exercise extensively Recommended to continue current medication      The patient verbalized understanding of instructions provided today and agreed to receive a MyChart copy of patient instruction and/or educational materials. Telephone follow up appointment with pharmacy team member scheduled for: See next appointment with "Care Management Staff" under "What's Next" below.

## 2020-12-10 ENCOUNTER — Other Ambulatory Visit: Payer: Self-pay | Admitting: Family Medicine

## 2020-12-12 ENCOUNTER — Ambulatory Visit (INDEPENDENT_AMBULATORY_CARE_PROVIDER_SITE_OTHER): Payer: Medicare Other | Admitting: *Deleted

## 2020-12-12 DIAGNOSIS — Z0000 Encounter for general adult medical examination without abnormal findings: Secondary | ICD-10-CM

## 2020-12-12 NOTE — Progress Notes (Addendum)
Subjective:   Kayla Brown is a 71 y.o. female who presents for Medicare Annual (Subsequent) preventive examination.  I connected with  Kayla Brown on 12/12/20 by a telephone enabled telemedicine application and verified that I am speaking with the correct person using two identifiers.   I discussed the limitations of evaluation and management by telemedicine. The patient expressed understanding and agreed to proceed.  Patient location: home  Provider location:Tele-Health visit    Review of Systems    NA Cardiac Risk Factors include: advanced age (>44men, >6 women);diabetes mellitus;dyslipidemia;hypertension;obesity (BMI >30kg/m2)     Objective:    Today's Vitals   12/12/20 0943  PainSc: 0-No pain   There is no height or weight on file to calculate BMI.  Advanced Directives 12/12/2020 10/26/2020 08/10/2020 05/27/2020 04/01/2020 01/19/2020 03/17/2019  Does Patient Have a Medical Advance Directive? - No No - No No No  Would patient like information on creating a medical advance directive? No - Patient declined No - Patient declined No - Patient declined (No Data) Yes (ED - Information included in AVS) Yes (ED - Information included in AVS) No - Patient declined    Current Medications (verified) Outpatient Encounter Medications as of 12/12/2020  Medication Sig   albuterol (VENTOLIN HFA) 108 (90 Base) MCG/ACT inhaler Inhale 1-2 puffs into the lungs every 6 (six) hours as needed for wheezing or shortness of breath.   Alcohol Swabs (ALCOHOL PREP) PADS Pt uses an alcohol pad each time sugars are tested. Pt tests twice daily. Dx E11.9   amLODipine (NORVASC) 10 MG tablet TAKE 1 TABLET BY MOUTH EVERY DAY   atorvastatin (LIPITOR) 40 MG tablet TAKE 1 TABLET BY MOUTH EVERY DAY   buPROPion (WELLBUTRIN XL) 150 MG 24 hr tablet Take 1 tablet (150 mg total) by mouth daily.   Calcium Carb-Cholecalciferol [phone] MG-UNIT CAPS Take 1 tablet by mouth 2 (two) times daily.   carvedilol (COREG) 25 MG  tablet Take 1 tablet (25 mg total) by mouth 2 (two) times daily.   fish oil-omega-3 fatty acids 1000 MG capsule Take 1 g by mouth daily.   fluticasone (FLONASE) 50 MCG/ACT nasal spray SPRAY 2 SPRAYS INTO EACH NOSTRIL EVERY DAY (Patient taking differently: Place 2 sprays into both nostrils daily.)   furosemide (LASIX) 20 MG tablet TAKE 1 TABLET BY MOUTH EVERY DAY   gabapentin (NEURONTIN) 100 MG capsule Take 100 mg by mouth 2 (two) times daily.   glipiZIDE (GLUCOTROL) 5 MG tablet Take 1 tablet (5 mg total) by mouth 2 (two) times daily before a meal.   glucose blood (FREESTYLE LITE) test strip USE TO TEST BLOOD GLUCOSE 2 TIMES DAILY. Dx E11.9   loratadine (CLARITIN) 10 MG tablet TAKE 1 TABLET BY MOUTH EVERY DAY (Patient taking differently: Take 10 mg by mouth daily.)   Melatonin 3 MG TABS Take 10 mg by mouth at bedtime.    metFORMIN (GLUCOPHAGE-XR) 500 MG 24 hr tablet Take 1 tablet (500 mg total) by mouth in the morning and at bedtime.   methocarbamol (ROBAXIN) 500 MG tablet Take 500 mg by mouth 2 (two) times daily.   Multiple Vitamins-Minerals (VISION FORMULA) TABS Take 1 tablet by mouth daily.   OVER THE COUNTER MEDICATION Take 1 tablet by mouth daily. Equate Vision Formula 50+   sertraline (ZOLOFT) 50 MG tablet TAKE 1 TABLET BY MOUTH EVERY DAY   traMADol (ULTRAM) 50 MG tablet Take 1 tablet (50 mg total) by mouth every 6 (six) hours as needed.   TRUEPLUS LANCETS  30G MISC Use one lancet each time sugars are tested. Pt tests twice daily. Dx. E11.9   UNABLE TO FIND Med Name: Healthwise talking meter   No facility-administered encounter medications on file as of 12/12/2020.    Allergies (verified) Patient has no known allergies.   History: Past Medical History:  Diagnosis Date   Anxiety    Diabetes mellitus    Edema of extremities 03/02/2016   Essential hypertension, benign    Lazy eye    Major depressive disorder, single episode, unspecified    Obesity (BMI 30-39.9) 03/01/2016   OSA  (obstructive sleep apnea)    Pulmonary HTN (HCC)    mild with PASP by echo 05/2016   Pure hypercholesterolemia    Retinopathy, due to hypertension    Past Surgical History:  Procedure Laterality Date   CPAP TITRATION  10/14/2015   DILATION AND CURETTAGE OF UTERUS  2005-2008   x2   EYE SURGERY     cataracts   WISDOM TOOTH EXTRACTION     Family History  Adopted: Yes  Problem Relation Age of Onset   Alcohol abuse Mother    ADD / ADHD Mother    Dementia Mother    Breast cancer Neg Hx    Social History   Socioeconomic History   Marital status: Married    Spouse name: Cherre Huger   Number of children: Not on file   Years of education: Not on file   Highest education level: 12th grade  Occupational History   Occupation: retired  Tobacco Use   Smoking status: Never   Smokeless tobacco: Never  Vaping Use   Vaping Use: Never used  Substance and Sexual Activity   Alcohol use: No    Alcohol/week: 0.0 standard drinks   Drug use: No   Sexual activity: Yes    Partners: Male    Birth control/protection: None  Other Topics Concern   Not on file  Social History Narrative   Not on file   Social Determinants of Health   Financial Resource Strain: Low Risk    Difficulty of Paying Living Expenses: Not very hard  Food Insecurity: No Food Insecurity   Worried About Programme researcher, broadcasting/film/video in the Last Year: Never true   Ran Out of Food in the Last Year: Never true  Transportation Needs: No Transportation Needs   Lack of Transportation (Medical): No   Lack of Transportation (Non-Medical): No  Physical Activity: Insufficiently Active   Days of Exercise per Week: 3 days   Minutes of Exercise per Session: 10 min  Stress: No Stress Concern Present   Feeling of Stress : Only a little  Social Connections: Moderately Integrated   Frequency of Communication with Friends and Family: Twice a week   Frequency of Social Gatherings with Friends and Family: Once a week   Attends Religious  Services: More than 4 times per year   Active Member of [employer] or Organizations: No   Attends Engineer, structural: Never   Marital Status: Married    Tobacco Counseling Counseling given: Not Answered   Clinical Intake:  Pre-visit preparation completed: Yes  Pain : No/denies pain Pain Score: 0-No pain     Nutritional Risks: None Diabetes: Yes CBG done?: No (telehealth) Did pt. bring in CBG monitor from home?: No  How often do you need to have someone help you when you read instructions, pamphlets, or other written materials from your doctor or pharmacy?: 1 - Never  Diabetic  Yes  Nutrition Risk Assessment:  Has the patient had any N/V/D within the last 2 months?  No  Does the patient have any non-healing wounds?  No  Has the patient had any unintentional weight loss or weight gain?  No   Diabetes:  Is the patient diabetic?  Yes  If diabetic, was a CBG obtained today?  No  Did the patient bring in their glucometer from home?  No  How often do you monitor your CBG's? 1 time a day.   Financial Strains and Diabetes Management:  Are you having any financial strains with the device, your supplies or your medication? No .  Does the patient want to be seen by Chronic Care Management for management of their diabetes?   Would the patient like to be referred to a Nutritionist or for Diabetic Management?  No   Diabetic Exams:  Diabetic Eye Exam: Completed 08-01-2020. Overdue for diabetic eye exam. Pt has been advised about the importance in completing this exam. A referral has been placed today. Message sent to referral coordinator for scheduling purposes. Advised pt to expect a call from office referred to regarding appt.  Diabetic Foot Exam: Completed . Pt has been advised about the importance in completing this exam. Pt is scheduled for diabetic foot exam on 02-03-2021.    Interpreter Needed?: No  Information entered by :: Remi HaggardJulie Jodeen Mclin LPN   Activities of Daily  Living In your present state of health, do you have any difficulty performing the following activities: 12/12/2020 11/18/2020  Hearing? Y N  Comment hearing aid both ears  only using left ear now -  Vision? N N  Difficulty concentrating or making decisions? N N  Walking or climbing stairs? N N  Comment - -  Dressing or bathing? N N  Doing errands, shopping? N N  Preparing Food and eating ? N -  Using the Toilet? N -  In the past six months, have you accidently leaked urine? N -  Do you have problems with loss of bowel control? N -  Managing your Medications? N -  Managing your Finances? N -  Housekeeping or managing your Housekeeping? N -  Some recent data might be hidden    Patient Care Team: Sheliah Hatchabori, Katherine E, MD as PCP - General (Family Medicine) Carman ChingEdwards, James, MD (Inactive) as Consulting Physician (Gastroenterology) Jerene BearsMiller, Mary S, MD as Consulting Physician (Gynecology) Dahlia ByesPotts, Jacob, [facility] as Pharmacist (Pharmacist) Yetta GlassmanSandlin, Melissa J, RN as Case Manager  Indicate any recent Medical Services you may have received from other than [facility] providers in the past year (date may be approximate).     Assessment:   This is a routine wellness examination for Treana.  Hearing/Vision screen Hearing Screening - Comments:: Hearing aids in both ears  Vision Screening - Comments:: Up to date. [facility]  Dietary issues and exercise activities discussed: Current Exercise Habits: Home exercise routine, Type of exercise: walking, Time (Minutes): 20, Frequency (Times/Week): 4, Weekly Exercise (Minutes/Week): 80, Intensity: Mild   Goals Addressed             This Visit's Progress    Weight (lb) < 200 lb (90.7 kg)       Would like to loose 10lb         Depression Screen PHQ 2/9 Scores 12/12/2020 12/12/2020 11/18/2020 08/10/2020 05/23/2020 04/01/2020 01/19/2020  PHQ - 2 Score 0 0 0 0 0 0 0  PHQ- 9 Score - - 0 - 0 - -    Fall Risk  Fall Risk  12/12/2020 11/18/2020  08/10/2020 05/27/2020 05/27/2020  Falls in the past year? 0 0 0 0 0  Number falls in past yr: 0 0 - 0 0  Comment - - - - -  Injury with Fall? 0 0 - 0 0  Risk for fall due to : - No Fall Risks - - No Fall Risks  Follow up Falls evaluation completed;Falls prevention discussed - - Falls evaluation completed;Education provided;Falls prevention discussed Falls prevention discussed;Education provided;Falls evaluation completed    FALL RISK PREVENTION PERTAINING TO THE HOME:  Any stairs in or around the home? No  If so, are there any without handrails? No  Home free of loose throw rugs in walkways, pet beds, electrical cords, etc? Yes  Adequate lighting in your home to reduce risk of falls? Yes   ASSISTIVE DEVICES UTILIZED TO PREVENT FALLS:  Life alert? No  Use of a cane, walker or w/c? Yes  occasionally  Grab bars in the bathroom? Yes  Shower chair or bench in shower? Yes  Elevated toilet seat or a handicapped toilet? Yes   TIMED UP AND GO:  Was the test performed? No .    Tele-Health visit    Cognitive Function:  Normal cognitive status assessed by direct observation by this Nurse Health Advisor. No abnormalities found.          Immunizations Immunization History  Administered Date(s) Administered   Fluad Quad(high Dose 65+) 02/16/2019, 03/28/2020   Influenza,inj,Quad PF,6+ Mos 04/24/2016, 03/04/2017, 03/20/2018   PFIZER(Purple Top)SARS-COV-2 Vaccination 09/24/2019, 10/15/2019, 04/25/2020   Pneumococcal Conjugate-13 07/06/2016   Pneumococcal Polysaccharide-23 07/01/2017   Tdap 09/23/2009    TDAP status: Due, Education has been provided regarding the importance of this vaccine. Advised may receive this vaccine at local pharmacy or Health Dept. Aware to provide a copy of the vaccination record if obtained from local pharmacy or Health Dept. Verbalized acceptance and understanding.  Flu Vaccine status: Up to date  Pneumococcal vaccine status: Up to date  Covid-19 vaccine  status: Information provided on how to obtain vaccines.   Qualifies for Shingles Vaccine? Yes   Zostavax completed No   Shingrix Completed?: No.    Education has been provided regarding the importance of this vaccine. Patient has been advised to call insurance company to determine out of pocket expense if they have not yet received this vaccine. Advised may also receive vaccine at local pharmacy or Health Dept. Verbalized acceptance and understanding.  Screening Tests Health Maintenance  Topic Date Due   Zoster Vaccines- Shingrix (1 of 2) Never done   TETANUS/TDAP  09/24/2019   COVID-19 Vaccine (4 - Booster for Pfizer series) 08/23/2020   HEMOGLOBIN A1C  01/18/2021   INFLUENZA VACCINE  01/23/2021   FOOT EXAM  03/04/2021   URINE MICROALBUMIN  05/23/2021   OPHTHALMOLOGY EXAM  08/01/2021   MAMMOGRAM  01/31/2022   COLONOSCOPY (Pts 45-38yrs Insurance coverage will need to be confirmed)  12/15/2026   DEXA SCAN  Completed   Hepatitis C Screening  Completed   PNA vac Low Risk Adult  Completed   HPV VACCINES  Aged Out    Health Maintenance  Health Maintenance Due  Topic Date Due   Zoster Vaccines- Shingrix (1 of 2) Never done   TETANUS/TDAP  09/24/2019   COVID-19 Vaccine (4 - Booster for Pfizer series) 08/23/2020    Colorectal cancer screening: Type of screening: Colonoscopy. Completed 12-10-2020. Repeat every 10 years  Mammogram status: Completed 02-01-2020. Repeat every year  Bone Density  status: Ordered 11-18-2020. Pt provided with contact info and advised to call to schedule appt.  Lung Cancer Screening: (Low Dose CT Chest recommended if Age 66-80 years, 30 pack-year currently smoking OR have quit w/in 15years.) does not qualify.   Lung Cancer Screening Referral: na  Additional Screening:  Hepatitis C Screening: does not qualify; Completed 09-07-2016  Vision Screening: Recommended annual ophthalmology exams for early detection of glaucoma and other disorders of the eye. Is the  patient up to date with their annual eye exam?  Yes  Who is the provider or what is the name of the office in which the patient attends annual eye exams? [facility] If pt is not established with a provider, would they like to be referred to a provider to establish care?  established .   Dental Screening: Recommended annual dental exams for proper oral hygiene  Community Resource Referral / Chronic Care Management: CRR required this visit?  No   CCM required this visit?  No      Plan:     I have personally reviewed and noted the following in the patient's chart:   Medical and social history Use of alcohol, tobacco or illicit drugs  Current medications and supplements including opioid prescriptions.  Functional ability and status Nutritional status Physical activity Advanced directives List of other physicians Hospitalizations, surgeries, and ER visits in previous 12 months Vitals Screenings to include cognitive, depression, and falls Referrals and appointments  In addition, I have reviewed and discussed with patient certain preventive protocols, quality metrics, and best practice recommendations. A written personalized care plan for preventive services as well as general preventive health recommendations were provided to patient.     Remi Haggard, LPN   [id number]   Nurse Notes:NA

## 2020-12-12 NOTE — Patient Instructions (Signed)
Kayla Brown , Thank you for taking time to come for your Medicare Wellness Visit. I appreciate your ongoing commitment to your health goals. Please review the following plan we discussed and let me know if I can assist you in the future.   Screening recommendations/referrals: Colonoscopy: completed 12-14-2016  Mammogram: ordered  Schedule 01-2021 Bone Density: ordered  Schedule 01-2021 Recommended yearly ophthalmology/optometry visit for glaucoma screening and checkup Recommended yearly dental visit for hygiene and checkup  Vaccinations: Influenza vaccine: Up to Date Pneumococcal vaccine: Up to Date Tdap vaccine: Due   Education provided Shingles vaccine: Education provided   Advanced directives:  Copy requested   Conditions/risks identified: NA  Next appointment: 12-28[facility]2020 [facility]   Preventive Care 69 Years and Older, Female Preventive care refers to lifestyle choices and visits with your health care provider that can promote health and wellness. What does preventive care include? A yearly physical exam. This is also called an annual well check. Dental exams once or twice a year. Routine eye exams. Ask your health care provider how often you should have your eyes checked. Personal lifestyle choices, including: Daily care of your teeth and gums. Regular physical activity. Eating a healthy diet. Avoiding tobacco and drug use. Limiting alcohol use. Practicing safe sex. Taking low-dose aspirin every day. Taking vitamin and mineral supplements as recommended by your health care provider. What happens during an annual well check? The services and screenings done by your health care provider during your annual well check will depend on your age, overall health, lifestyle risk factors, and family history of disease. Counseling  Your health care provider may ask you questions about your: Alcohol use. Tobacco use. Drug use. Emotional well-being. Home and relationship  well-being. Sexual activity. Eating habits. History of falls. Memory and ability to understand (cognition). Work and work Astronomer. Reproductive health. Screening  You may have the following tests or measurements: Height, weight, and BMI. Blood pressure. Lipid and cholesterol levels. These may be checked every 5 years, or more frequently if you are over 25 years old. Skin check. Lung cancer screening. You may have this screening every year starting at age 69 if you have a 30-pack-year history of smoking and currently smoke or have quit within the past 15 years. Fecal occult blood test (FOBT) of the stool. You may have this test every year starting at age 69. Flexible sigmoidoscopy or colonoscopy. You may have a sigmoidoscopy every 5 years or a colonoscopy every 10 years starting at age 69. Hepatitis C blood test. Hepatitis B blood test. Sexually transmitted disease (STD) testing. Diabetes screening. This is done by checking your blood sugar (glucose) after you have not eaten for a while (fasting). You may have this done every 1-3 years. Bone density scan. This is done to screen for osteoporosis. You may have this done starting at age 48. Mammogram. This may be done every 1-2 years. Talk to your health care provider about how often you should have regular mammograms. Talk with your health care provider about your test results, treatment options, and if necessary, the need for more tests. Vaccines  Your health care provider may recommend certain vaccines, such as: Influenza vaccine. This is recommended every year. Tetanus, diphtheria, and acellular pertussis (Tdap, Td) vaccine. You may need a Td booster every 10 years. Zoster vaccine. You may need this after age 69. Pneumococcal 13-valent conjugate (PCV13) vaccine. One dose is recommended after age 69. Pneumococcal polysaccharide (PPSV23) vaccine. One dose is recommended after age 69. Talk to your  health care provider about which  screenings and vaccines you need and how often you need them. This information is not intended to replace advice given to you by your health care provider. Make sure you discuss any questions you have with your health care provider. Document Released: 07/08/2015 Document Revised: 02/29/2016 Document Reviewed: 04/12/2015 Elsevier Interactive Patient Education  2017 [employer] Prevention in the Home Falls can cause injuries. They can happen to people of all ages. There are many things you can do to make your home safe and to help prevent falls. What can I do on the outside of my home? Regularly fix the edges of walkways and driveways and fix any cracks. Remove anything that might make you trip as you walk through a door, such as a raised step or threshold. Trim any bushes or trees on the path to your home. Use bright outdoor lighting. Clear any walking paths of anything that might make someone trip, such as rocks or tools. Regularly check to see if handrails are loose or broken. Make sure that both sides of any steps have handrails. Any raised decks and porches should have guardrails on the edges. Have any leaves, snow, or ice cleared regularly. Use sand or salt on walking paths during winter. Clean up any spills in your garage right away. This includes oil or grease spills. What can I do in the bathroom? Use night lights. Install grab bars by the toilet and in the tub and shower. Do not use towel bars as grab bars. Use non-skid mats or decals in the tub or shower. If you need to sit down in the shower, use a plastic, non-slip stool. Keep the floor dry. Clean up any water that spills on the floor as soon as it happens. Remove soap buildup in the tub or shower regularly. Attach bath mats securely with double-sided non-slip rug tape. Do not have throw rugs and other things on the floor that can make you trip. What can I do in the bedroom? Use night lights. Make sure that you have a  light by your bed that is easy to reach. Do not use any sheets or blankets that are too big for your bed. They should not hang down onto the floor. Have a firm chair that has side arms. You can use this for support while you get dressed. Do not have throw rugs and other things on the floor that can make you trip. What can I do in the kitchen? Clean up any spills right away. Avoid walking on wet floors. Keep items that you use a lot in easy-to-reach places. If you need to reach something above you, use a strong step stool that has a grab bar. Keep electrical cords out of the way. Do not use floor polish or wax that makes floors slippery. If you must use wax, use non-skid floor wax. Do not have throw rugs and other things on the floor that can make you trip. What can I do with my stairs? Do not leave any items on the stairs. Make sure that there are handrails on both sides of the stairs and use them. Fix handrails that are broken or loose. Make sure that handrails are as long as the stairways. Check any carpeting to make sure that it is firmly attached to the stairs. Fix any carpet that is loose or worn. Avoid having throw rugs at the top or bottom of the stairs. If you do have throw rugs, attach them  to the floor with carpet tape. Make sure that you have a light switch at the top of the stairs and the bottom of the stairs. If you do not have them, ask someone to add them for you. What else can I do to help prevent falls? Wear shoes that: Do not have high heels. Have rubber bottoms. Are comfortable and fit you well. Are closed at the toe. Do not wear sandals. If you use a stepladder: Make sure that it is fully opened. Do not climb a closed stepladder. Make sure that both sides of the stepladder are locked into place. Ask someone to hold it for you, if possible. Clearly mark and make sure that you can see: Any grab bars or handrails. First and last steps. Where the edge of each step  is. Use tools that help you move around (mobility aids) if they are needed. These include: Canes. Walkers. Scooters. Crutches. Turn on the lights when you go into a dark area. Replace any light bulbs as soon as they burn out. Set up your furniture so you have a clear path. Avoid moving your furniture around. If any of your floors are uneven, fix them. If there are any pets around you, be aware of where they are. Review your medicines with your doctor. Some medicines can make you feel dizzy. This can increase your chance of falling. Ask your doctor what other things that you can do to help prevent falls. This information is not intended to replace advice given to you by your health care provider. Make sure you discuss any questions you have with your health care provider. Document Released: 04/07/2009 Document Revised: 11/17/2015 Document Reviewed: 07/16/2014 Elsevier Interactive Patient Education  2017 [employer].

## 2020-12-13 ENCOUNTER — Other Ambulatory Visit: Payer: Self-pay | Admitting: Family Medicine

## 2020-12-13 DIAGNOSIS — Z1231 Encounter for screening mammogram for malignant neoplasm of breast: Secondary | ICD-10-CM

## 2020-12-15 NOTE — Progress Notes (Signed)
Cardiology Office Note   Date:  12/19/2020   ID:  Kayla Brown, DOB [birth year], MRN [id number]  PCP:  [name], MD  Cardiologist:   Jane Broughton [name], MD   Chief Complaint  Patient presents with   Follow-up    6 months.       History of Present Illness: Kayla Brown is a 70 y.o. female who is seen for follow up dyspnea. She has a history of OSA.  She has a history of mild pulmonary HTN based on Echo in 2016 and 9417 but not replicated on Echo in 4081 and 2020. Myoview in August 2020 was normal. She has  HTN and dyslipidemia. Prior pulmonary work up includes PFT 04/04/15   - mild Diffusion defect, normal flows and volumes without response to bronchodilator. CT chest March 05, 2019 extensive atelectatic changes throughout the lungs with a dependent predominance, mosaic attenuation throughout the lungs, emphysema. VQ scan 03/04/2019 no unmatched segmental perfusion defects to suggest acute PE. Autoimmune work-up negative 2020. She had a 6-minute walk test that showed 267 m completed.  With no desaturations. Repeat PFTs show no airflow obstruction or restriction.  FEV1 102%, ratio 86, FVC 90% normal mid flows,, DLCO 90%.  She was admitted 9/8-9/21/20 with acute hypoxic respiratory failure. Patient developed  increased oxygen requirement on 9/10, at 8 L nasal cannula, PCCM consulted, work-up significant for atelectasis and ? pulmonary edema. BNP level was normal.   Patient improved with IV diuresis, and pulmonary PT, but unfortunately developed acute kidney injury due to diuresis. ACEi, hygroton, spironolactone  were discontinued. CT, Echo,  and CXR findings as noted below. She diuresed 20 lbs in the hospital.    On follow up today she is doing well.  Lives at [place]. She did have another spinal injection earlier this month. Weight has been stable. Only took an extra lasix once in May when she over did it with her husband's birthday. Hasn't had to use inhaler. Is compliant with  CPAP. Did see Dr Hollie Salk and renal indices stable.    Past Medical History:  Diagnosis Date   Anxiety    Diabetes mellitus    Edema of extremities 03/02/2016   Essential hypertension, benign    Lazy eye    Major depressive disorder, single episode, unspecified    Obesity (BMI 30-39.9) 03/01/2016   OSA (obstructive sleep apnea)    Pulmonary HTN (HCC)    mild with PASP 49mHg by echo 05/2016   Pure hypercholesterolemia    Retinopathy, due to hypertension     Past Surgical History:  Procedure Laterality Date   CPAP TITRATION  10/14/2015   DILATION AND CURETTAGE OF UTERUS  2005-2008   x2   EYE SURGERY     cataracts   WISDOM TOOTH EXTRACTION       Current Outpatient Medications  Medication Sig Dispense Refill   albuterol (VENTOLIN HFA) 108 (90 Base) MCG/ACT inhaler Inhale 1-2 puffs into the lungs every 6 (six) hours as needed for wheezing or shortness of breath.     Alcohol Swabs (ALCOHOL PREP) PADS Pt uses an alcohol pad each time sugars are tested. Pt tests twice daily. Dx E11.9 100 each 3   amLODipine (NORVASC) 10 MG tablet TAKE 1 TABLET BY MOUTH EVERY DAY 90 tablet 1   atorvastatin (LIPITOR) 40 MG tablet TAKE 1 TABLET BY MOUTH EVERY DAY 90 tablet 1   B Complex Vitamins (B COMPLEX PO) Take 1 tablet by mouth daily.     buPROPion (  WELLBUTRIN XL) 150 MG 24 hr tablet Take 1 tablet (150 mg total) by mouth daily. 30 tablet 3   Calcium Carb-Cholecalciferol [phone] MG-UNIT CAPS Take 1 tablet by mouth 2 (two) times daily.     carvedilol (COREG) 25 MG tablet Take 1 tablet (25 mg total) by mouth 2 (two) times daily. 180 tablet 3   fish oil-omega-3 fatty acids 1000 MG capsule Take 1 g by mouth daily.     fluticasone (FLONASE) 50 MCG/ACT nasal spray SPRAY 2 SPRAYS INTO EACH NOSTRIL EVERY DAY (Patient taking differently: Place 2 sprays into both nostrils daily.) 18 g 1   furosemide (LASIX) 20 MG tablet TAKE 1 TABLET BY MOUTH EVERY DAY 90 tablet 1   gabapentin (NEURONTIN) 100 MG capsule Take 100  mg by mouth 2 (two) times daily.     glipiZIDE (GLUCOTROL) 5 MG tablet Take 1 tablet (5 mg total) by mouth 2 (two) times daily before a meal. 180 tablet 3   glucose blood (FREESTYLE LITE) test strip USE TO TEST BLOOD GLUCOSE 2 TIMES DAILY. Dx E11.9 100 each 12   loratadine (CLARITIN) 10 MG tablet TAKE 1 TABLET BY MOUTH EVERY DAY (Patient taking differently: Take 10 mg by mouth daily.) 30 tablet 11   Melatonin 3 MG TABS Take 10 mg by mouth at bedtime.      metFORMIN (GLUCOPHAGE-XR) 500 MG 24 hr tablet Take 1 tablet (500 mg total) by mouth in the morning and at bedtime. 180 tablet 3   methocarbamol (ROBAXIN) 500 MG tablet Take 500 mg by mouth 2 (two) times daily.     Multiple Vitamins-Minerals (VISION FORMULA) TABS Take 1 tablet by mouth daily.     OVER THE COUNTER MEDICATION Take 1 tablet by mouth daily. Equate Vision Formula 50+     sertraline (ZOLOFT) 50 MG tablet TAKE 1 TABLET BY MOUTH EVERY DAY 90 tablet 1   traMADol (ULTRAM) 50 MG tablet Take 1 tablet (50 mg total) by mouth every 6 (six) hours as needed. 30 tablet 0   TRUEPLUS LANCETS 30G MISC Use one lancet each time sugars are tested. Pt tests twice daily. Dx. E11.9 100 each 3   UNABLE TO FIND Med Name: Healthwise talking meter 1 kit 0   No current facility-administered medications for this visit.    Allergies:   Patient has no known allergies.    Social History:  The patient  reports that she has never smoked. She has never used smokeless tobacco. She reports that she does not drink alcohol and does not use drugs.   Family History:  The patient's family history includes ADD / ADHD in her mother; Alcohol abuse in her mother; Dementia in her mother. She was adopted.    ROS:  Please see the history of present illness.   Otherwise, review of systems are positive for none.   All other systems are reviewed and negative.    PHYSICAL EXAM: VS:  BP 130/60 (BP Location: Left Arm, Patient Position: Sitting, Cuff Size: Normal)   Pulse (!) 53    Ht 5' (1.524 m)   Wt 178 lb (80.7 kg)   LMP 06/25/2008   BMI 34.76 kg/m  , BMI Body mass index is 34.76 kg/m. GEN: Well nourished, obese, in no acute distress  HEENT: normal  Neck: no JVD, carotid bruits, or masses Cardiac: RRR; no murmurs, rubs, or gallops,no edema  Respiratory:  clear to auscultation bilaterally, normal work of breathing GI: soft, nontender, nondistended, + BS MS: no deformity or  atrophy  Skin: warm and dry, no rash Neuro:  Strength and sensation are intact Psych: euthymic mood, full affect   EKG:  EKG isordered today. The ekg ordered today demonstrates NSR rate 53, old septal infarct. No change from May 2021. I have personally reviewed and interpreted this study.    Recent Labs: 11/18/2020: ALT 20; BUN 40; Creat 1.06; Hemoglobin 11.7; Platelets 268; Potassium 4.2; Sodium 140; TSH 1.19    Lipid Panel    Component Value Date/Time   CHOL 126 11/18/2020 1436   TRIG 141 11/18/2020 1436   HDL 52 11/18/2020 1436   CHOLHDL 2.4 11/18/2020 1436   VLDL 46.4 (H) 05/23/2020 1431   LDLCALC 52 11/18/2020 1436   LDLDIRECT 50.0 05/23/2020 1431      Wt Readings from Last 3 Encounters:  12/19/20 178 lb (80.7 kg)  11/18/20 185 lb 6.4 oz (84.1 kg)  07/21/20 175 lb (79.4 kg)      Other studies Reviewed: Additional studies/ records that were reviewed today include:  Myoview 02/02/19: Study Highlights    Nuclear stress EF: 54%. There was no ST segment deviation noted during stress. This is a low risk study. The left ventricular ejection fraction is normal (55-65%). The study is normal.   Low risk stress nuclear study with normal perfusion and normal left ventricular regional and global systolic function.       Echo 03/06/19: IMPRESSIONS      1. The left ventricle has hyperdynamic systolic function, with an ejection fraction of >65%. The cavity size was normal. There is moderate concentric left ventricular hypertrophy. Left ventricular diastolic Doppler  parameters are consistent with  impaired relaxation. Elevated mean left atrial pressure No evidence of left ventricular regional wall motion abnormalities.  2. The right ventricle has normal systolic function. The cavity was normal. There is no increase in right ventricular wall thickness. Right ventricular systolic pressure could not be assessed.  3. Left atrial size was mildly dilated.  4. Trivial pericardial effusion is present.  5. The aorta is normal unless otherwise noted.   PORTABLE CHEST 1 VIEW   COMPARISON:  Chest radiograph dated 03/05/2019   FINDINGS: Chronic interstitial coarsening and mild bronchitic changes. No focal consolidation, pleural effusion, or pneumothorax. Linear density in the left mid lung field, likely atelectatic changes. The cardiac silhouette is within normal limits. No acute osseous pathology.   IMPRESSION: No active disease.     Electronically Signed   By: Anner Crete M.D.   On: 03/11/2019 10:21  CT CHEST WITHOUT CONTRAST   TECHNIQUE: Multidetector CT imaging of the chest was performed following the standard protocol without IV contrast.   COMPARISON:  CT 04/05/2015   FINDINGS: Cardiovascular: Normal heart size. No pericardial effusion. Atherosclerotic calcification of the aortic leaflets is noted. There is mitral annular calcification is well. The aorta is normal caliber with atheromatous plaque. Additional calcifications are present in the proximal great vessels and upper abdominal aorta. Major venous structures are unremarkable.   Mediastinum/Nodes: Numerous though nonenlarged mediastinal nodes are present. Evaluation for hilar adenopathy is limited in the absence of contrast. No axillary adenopathy. Thyroid gland and thoracic inlet are unremarkable. Bowing of the posterior trachea 0 likely related to imaging during exhalation. There is mild airways thickening. The esophagus is unremarkable.   Lungs/Pleura: Redemonstration of  mosaic attenuation throughout the lungs superimposed on centrilobular and paraseptal emphysema. There is increasing bandlike areas of subsegmental atelectasis and/or scarring as well as additional atelectatic change posteriorly in the lung bases. No  pneumothorax. No effusion. Stable subpleural area of scarring is seen in the right middle lobe.   Upper Abdomen: Small accessory splenule. Mild bilateral symmetric perinephric stranding, a nonspecific finding though may correlate with either age or decreased renal function. Portion of the hepatic flexure is interposed anterior to the liver. Fatty replacement of the pancreas. No pancreatic ductal dilatation or surrounding inflammatory changes. No acute abnormalities present in the visualized portions of the upper abdomen.   Musculoskeletal: Multilevel degenerative changes are present in the imaged portions of the spine. No acute osseous abnormality or suspicious osseous lesion.   IMPRESSION: 1. Extensive atelectatic changes are present throughout the lungs with a dependent predominance. 2. Redemonstration of mosaic attenuation throughout the lungs superimposed on centrilobular and paraseptal emphysema, suggestive of small airways disease. 3. Mild bilateral symmetric perinephric stranding, a nonspecific finding though may correlate with either age or decreased renal function. 4. Aortic Atherosclerosis (ICD10-I70.0) and Emphysema (ICD10-J43.9).     Electronically Signed   By: Lovena Le M.D.   On: 03/05/2019 22:41  ASSESSMENT AND PLAN:  1.  Chronic diastolic CHF. Admitted in September 2020 with acute respiratory failure. Given clinical response this appears to be predominantly acute on chronic diastolic CHF.  BNP low but may be masked by obesity. Echo with moderate LVH but normal systolic function and no evidence of pulmonary HTN. She is clinically doing well.  Continue sodium restriction. Continue current lasix dose with extra as  needed. Continue sodium and fluid restriction.   2.  Hypertension -BP is well controlled now. Continue Coreg and amlodipine.   3.  Morbid Obesity  -I have encouraged weight loss.   4.  OSA on CPAP. followed by pulmonary now.   5. CKD stage 3.   Current medicines are reviewed at length with the patient today.  The patient does not have concerns regarding medicines.  The following changes have been made:  See above.  Labs/ tests ordered today include:  No orders of the defined types were placed in this encounter.    Disposition:   FU with me in 6  months  Signed, Dario Yono [name], MD  12/19/2020 [facility] [address] [place], [place], [address] Phone [phone], Fax [phone]

## 2020-12-19 ENCOUNTER — Encounter: Payer: Self-pay | Admitting: Cardiology

## 2020-12-19 ENCOUNTER — Other Ambulatory Visit: Payer: Self-pay

## 2020-12-19 ENCOUNTER — Ambulatory Visit (INDEPENDENT_AMBULATORY_CARE_PROVIDER_SITE_OTHER): Payer: Medicare Other | Admitting: Cardiology

## 2020-12-19 VITALS — BP 130/60 | HR 53 | Ht 60.0 in | Wt 178.0 lb

## 2020-12-19 DIAGNOSIS — I5032 Chronic diastolic (congestive) heart failure: Secondary | ICD-10-CM

## 2020-12-19 DIAGNOSIS — I10 Essential (primary) hypertension: Secondary | ICD-10-CM

## 2020-12-19 DIAGNOSIS — G4733 Obstructive sleep apnea (adult) (pediatric): Secondary | ICD-10-CM

## 2020-12-20 ENCOUNTER — Other Ambulatory Visit: Payer: Self-pay | Admitting: Internal Medicine

## 2020-12-21 ENCOUNTER — Encounter: Payer: Self-pay | Admitting: *Deleted

## 2020-12-22 ENCOUNTER — Telehealth: Payer: Self-pay

## 2020-12-22 NOTE — Chronic Care Management (AMB) (Signed)
    Chronic Care Management Pharmacy Assistant   Name: Kayla Brown  MRN: [id number] DOB: 03/25/1951  Reason for Encounter: Chart Review   Medications: Outpatient Encounter Medications as of 12/22/2020  Medication Sig   albuterol (VENTOLIN HFA) 108 (90 Base) MCG/ACT inhaler Inhale 1-2 puffs into the lungs every 6 (six) hours as needed for wheezing or shortness of breath.   Alcohol Swabs (ALCOHOL PREP) PADS Pt uses an alcohol pad each time sugars are tested. Pt tests twice daily. Dx E11.9   amLODipine (NORVASC) 10 MG tablet TAKE 1 TABLET BY MOUTH EVERY DAY   atorvastatin (LIPITOR) 40 MG tablet TAKE 1 TABLET BY MOUTH EVERY DAY   B Complex Vitamins (B COMPLEX PO) Take 1 tablet by mouth daily.   buPROPion (WELLBUTRIN XL) 150 MG 24 hr tablet Take 1 tablet (150 mg total) by mouth daily.   Calcium Carb-Cholecalciferol [phone] MG-UNIT CAPS Take 1 tablet by mouth 2 (two) times daily.   carvedilol (COREG) 25 MG tablet Take 1 tablet (25 mg total) by mouth 2 (two) times daily.   fish oil-omega-3 fatty acids 1000 MG capsule Take 1 g by mouth daily.   fluticasone (FLONASE) 50 MCG/ACT nasal spray SPRAY 2 SPRAYS INTO EACH NOSTRIL EVERY DAY (Patient taking differently: Place 2 sprays into both nostrils daily.)   furosemide (LASIX) 20 MG tablet TAKE 1 TABLET BY MOUTH EVERY DAY   gabapentin (NEURONTIN) 100 MG capsule Take 100 mg by mouth 2 (two) times daily.   glipiZIDE (GLUCOTROL) 5 MG tablet TAKE 1 TABLET (5 MG TOTAL) BY MOUTH 2 (TWO) TIMES DAILY BEFORE A MEAL.   glucose blood (FREESTYLE LITE) test strip USE TO TEST BLOOD GLUCOSE 2 TIMES DAILY. Dx E11.9   loratadine (CLARITIN) 10 MG tablet TAKE 1 TABLET BY MOUTH EVERY DAY (Patient taking differently: Take 10 mg by mouth daily.)   Melatonin 3 MG TABS Take 10 mg by mouth at bedtime.    metFORMIN (GLUCOPHAGE-XR) 500 MG 24 hr tablet Take 1 tablet (500 mg total) by mouth in the morning and at bedtime.   methocarbamol (ROBAXIN) 500 MG tablet Take 500 mg by  mouth 2 (two) times daily.   Multiple Vitamins-Minerals (VISION FORMULA) TABS Take 1 tablet by mouth daily.   OVER THE COUNTER MEDICATION Take 1 tablet by mouth daily. Equate Vision Formula 50+   sertraline (ZOLOFT) 50 MG tablet TAKE 1 TABLET BY MOUTH EVERY DAY   traMADol (ULTRAM) 50 MG tablet Take 1 tablet (50 mg total) by mouth every 6 (six) hours as needed.   TRUEPLUS LANCETS 30G MISC Use one lancet each time sugars are tested. Pt tests twice daily. Dx. E11.9   UNABLE TO FIND Med Name: Healthwise talking meter   No facility-administered encounter medications on file as of 12/22/2020.   Reviewed chart for medication changes and adherence.   Recent OV, Consult or Hospital visit:  12/19/20- Peter [place], MD (Cardiology)- 6 month follow up, no changes  No medication changes indicated  No gaps in adherence identified. Patient has follow up scheduled with pharmacy team. No further action required.  Purvis Kilts CPA, CMA

## 2020-12-23 DIAGNOSIS — I129 Hypertensive chronic kidney disease with stage 1 through stage 4 chronic kidney disease, or unspecified chronic kidney disease: Secondary | ICD-10-CM | POA: Diagnosis not present

## 2020-12-23 DIAGNOSIS — I5032 Chronic diastolic (congestive) heart failure: Secondary | ICD-10-CM | POA: Diagnosis not present

## 2020-12-23 DIAGNOSIS — N1831 Chronic kidney disease, stage 3a: Secondary | ICD-10-CM | POA: Diagnosis not present

## 2020-12-23 DIAGNOSIS — E1122 Type 2 diabetes mellitus with diabetic chronic kidney disease: Secondary | ICD-10-CM | POA: Diagnosis not present

## 2020-12-28 ENCOUNTER — Ambulatory Visit (INDEPENDENT_AMBULATORY_CARE_PROVIDER_SITE_OTHER): Payer: Medicare Other

## 2020-12-28 ENCOUNTER — Ambulatory Visit (INDEPENDENT_AMBULATORY_CARE_PROVIDER_SITE_OTHER): Payer: Medicare Other | Admitting: Family Medicine

## 2020-12-28 ENCOUNTER — Encounter: Payer: Self-pay | Admitting: Family Medicine

## 2020-12-28 ENCOUNTER — Other Ambulatory Visit: Payer: Self-pay

## 2020-12-28 VITALS — BP 120/70 | HR 57 | Temp 97.1°F | Resp 19 | Ht 60.0 in | Wt 178.8 lb

## 2020-12-28 DIAGNOSIS — E1122 Type 2 diabetes mellitus with diabetic chronic kidney disease: Secondary | ICD-10-CM

## 2020-12-28 DIAGNOSIS — I5032 Chronic diastolic (congestive) heart failure: Secondary | ICD-10-CM

## 2020-12-28 DIAGNOSIS — F32A Depression, unspecified: Secondary | ICD-10-CM

## 2020-12-28 DIAGNOSIS — E785 Hyperlipidemia, unspecified: Secondary | ICD-10-CM

## 2020-12-28 DIAGNOSIS — I10 Essential (primary) hypertension: Secondary | ICD-10-CM

## 2020-12-28 DIAGNOSIS — N1832 Chronic kidney disease, stage 3b: Secondary | ICD-10-CM

## 2020-12-28 DIAGNOSIS — F419 Anxiety disorder, unspecified: Secondary | ICD-10-CM

## 2020-12-28 NOTE — Patient Instructions (Signed)
Follow up in November to recheck BP and cholesterol Keep up the good work!!  You're doing great!! No med changes at this time- continue the Wellbutrin Call with any questions or concerns Have a great summer!!!

## 2020-12-28 NOTE — Chronic Care Management (AMB) (Signed)
Chronic Care Management   CCM RN Visit Note  12/28/2020 Name: Kayla Brown MRN: [id number] DOB: Oct 16, 1950  Subjective: Kayla Brown is a 70 y.o. year old female who is a primary care patient of [name], [name], MD. The care management team was consulted for assistance with disease management and care coordination needs.    Engaged with patient by telephone for follow up visit in response to provider referral for case management and/or care coordination services.   Consent to Services:  The patient was given information about Chronic Care Management services, agreed to services, and gave verbal consent prior to initiation of services.  Please see initial visit note for detailed documentation.   Patient agreed to services and verbal consent obtained.   Assessment: Review of patient past medical history, allergies, medications, health status, including review of consultants reports, laboratory and other test data, was performed as part of comprehensive evaluation and provision of chronic care management services.   SDOH (Social Determinants of Health) assessments and interventions performed:    CCM Care Plan  No Known Allergies  Outpatient Encounter Medications as of 12/28/2020  Medication Sig   albuterol (VENTOLIN HFA) 108 (90 Base) MCG/ACT inhaler Inhale 1-2 puffs into the lungs every 6 (six) hours as needed for wheezing or shortness of breath.   Alcohol Swabs (ALCOHOL PREP) PADS Pt uses an alcohol pad each time sugars are tested. Pt tests twice daily. Dx E11.9   amLODipine (NORVASC) 10 MG tablet TAKE 1 TABLET BY MOUTH EVERY DAY   atorvastatin (LIPITOR) 40 MG tablet TAKE 1 TABLET BY MOUTH EVERY DAY   B Complex Vitamins (B COMPLEX PO) Take 1 tablet by mouth daily.   buPROPion (WELLBUTRIN XL) 150 MG 24 hr tablet Take 1 tablet (150 mg total) by mouth daily.   Calcium Carb-Cholecalciferol [phone] MG-UNIT CAPS Take 1 tablet by mouth 2 (two) times daily.   carvedilol (COREG) 25 MG  tablet Take 1 tablet (25 mg total) by mouth 2 (two) times daily.   fish oil-omega-3 fatty acids 1000 MG capsule Take 1 g by mouth daily.   fluticasone (FLONASE) 50 MCG/ACT nasal spray SPRAY 2 SPRAYS INTO EACH NOSTRIL EVERY DAY (Patient taking differently: Place 2 sprays into both nostrils daily.)   furosemide (LASIX) 20 MG tablet TAKE 1 TABLET BY MOUTH EVERY DAY   gabapentin (NEURONTIN) 100 MG capsule Take 100 mg by mouth 2 (two) times daily.   glipiZIDE (GLUCOTROL) 5 MG tablet TAKE 1 TABLET (5 MG TOTAL) BY MOUTH 2 (TWO) TIMES DAILY BEFORE A MEAL.   glucose blood (FREESTYLE LITE) test strip USE TO TEST BLOOD GLUCOSE 2 TIMES DAILY. Dx E11.9   loratadine (CLARITIN) 10 MG tablet TAKE 1 TABLET BY MOUTH EVERY DAY (Patient taking differently: Take 10 mg by mouth daily.)   Melatonin 3 MG TABS Take 10 mg by mouth at bedtime.    metFORMIN (GLUCOPHAGE-XR) 500 MG 24 hr tablet Take 1 tablet (500 mg total) by mouth in the morning and at bedtime. (Patient taking differently: Take 500 mg by mouth in the morning and at bedtime. Patient reports taking two pills twice a day.)   methocarbamol (ROBAXIN) 500 MG tablet Take 500 mg by mouth 2 (two) times daily.   Multiple Vitamins-Minerals (VISION FORMULA) TABS Take 1 tablet by mouth daily.   OVER THE COUNTER MEDICATION Take 1 tablet by mouth daily. Equate Vision Formula 50+   prednisoLONE acetate (PRED FORTE) 1 % ophthalmic suspension SMARTSIG:In Eye(s)   sertraline (ZOLOFT) 50 MG tablet TAKE  1 TABLET BY MOUTH EVERY DAY   traMADol (ULTRAM) 50 MG tablet Take 1 tablet (50 mg total) by mouth every 6 (six) hours as needed.   TRUEPLUS LANCETS 30G MISC Use one lancet each time sugars are tested. Pt tests twice daily. Dx. E11.9   UNABLE TO FIND Med Name: Healthwise talking meter   No facility-administered encounter medications on file as of 12/28/2020.    Patient Active Problem List   Diagnosis Date Noted   Morbid obesity ([place]) 11/18/2020   Osteopenia 11/18/2020   Type 2  diabetes mellitus with stage 3a chronic kidney disease, without long-term current use of insulin ([place]) 07/21/2020   Type 2 diabetes mellitus with hyperglycemia, with long-term current use of insulin ([place]) 04/19/2020   Lumbar radiculopathy 03/02/2020   Left hip pain 12/01/2019   Type 2 diabetes mellitus with stage 3b chronic kidney disease, without long-term current use of insulin ([place]) 29/52/8413   Diastolic CHF ([place]) 24/40/1027   Dyspnea 03/08/2019   Pulmonary edema 03/04/2019   Physical exam 02/06/2017   Anxiety and depression 12/05/2016   Obesity (BMI 30-39.9) 03/01/2016   OSA (obstructive sleep apnea) 07/11/2015   Pulmonary hypertension ([place]) 05/06/2015   Heart murmur 03/03/2015   Essential hypertension, benign 03/18/2014   Hyperlipidemia 03/18/2014   Post-menopause on HRT (hormone replacement therapy) 03/18/2014    Conditions to be addressed/monitored:CHF, HTN, HLD, DMII, and CKD Stage 3  Care Plan : RNCM:Chronic Kidney (Adult)  Updates made by [name], RN since 12/28/2020 12:00 AM     Problem: RNCM: Need for long term self management plan for chronic kidney disease   Priority: Medium     Long-Range Goal: RNCM : Disease Progression Prevented or Minimized   Start Date: 08/10/2020  Expected End Date: 04/24/2021  This Visit's Progress: On track  Recent Progress: On track  Priority: Medium  Note:   Current Barriers:  Knowledge Deficits related to Chronic kidney disease stage 3  now being followed by nephrology reports she saw Dr.Upton on 12/23/20, states her urine check was good with no protein, states she will see Dr.Upton again in 8 months reports B/P readings 120-130/60-80 Does not adhere to provider recommendations re: diet and fluids recommendations at times-reports avoiding dark sodas and drinking water instead  Nurse Case Manager Clinical Goal(s):  patient will verbalize understanding of plan for self management of chronic kidney disease patient will work with  CCM team and primary care provider to address needs related to chronic kidney disease patient will attend all scheduled medical appointments: Dr. Birdie Riddle 04/26/21, Dr. Kelton Pillar 01/18/21  Dr.Upton March 2023 patient will verbalize basic understanding of chronic kidney disease process and self health management plan as evidenced by no worsening of kidney function, adherence to diet and fluid recommendations  patient will work with CM team pharmacist to self manage medications the patient will demonstrate ongoing self health care management ability as evidenced by adherence to plan of care, medications, diet and provider appts, call providers with problems or concerns*  Interventions:  1:1 collaboration with [name], MD regarding development and update of comprehensive plan of care as evidenced by provider attestation and co-signature Inter-disciplinary care team collaboration (see longitudinal plan of care) Evaluation of current treatment plan related to chronic kidney disease and patient's adherence to plan as established by provider. Reinforced education to patient re: chronic kidney disease self management Reinforced importance of keeping blood sugar and B/P under control to help prevent further kidney damage Reviewed medications with patient and discussed  to call CCM team for any problems or concerns Reviewed scheduled/upcoming provider appointments including: Dr. Birdie Riddle 04/26/21, Dr. Kelton Pillar 01/18/21, [facility] March 2023,  Discussed plans with patient for ongoing care management follow up and provided patient with direct contact information for care management team  Patient Goals/Self-Care Activities Over the next 120 days, patient will:  - Patient will self administer medications as prescribed Patient will attend all scheduled provider appointments Patient will call pharmacy for medication refills Patient will call provider office for new concerns or questions -try  walking 3-4 times a week and gradually increase time and number of days  -follow diet and fluid recommendations from kidney doctor  Follow Up Plan: Telephone follow up appointment with care management team member scheduled for: 02/08/21 at 9 AM The patient has been provided with contact information for the care management team and has been advised to call with any health related questions or concerns.          Care Plan : GGYI:RSWNI Failure (Adult)  Updates made by [name], RN since 12/28/2020 12:00 AM     Problem: RNCM: Knowledge deficit related to long term self management of heart failure   Priority: High     Long-Range Goal: Effective self mangement of heart failure   Start Date: 08/10/2020  Expected End Date: 04/24/2021  This Visit's Progress: On track  Recent Progress: On track  Priority: High  Note:   Current Barriers:  Does not adhere to provider recommendations re: diet and fluid recommendations at times Reports weighing daily with weight stable at 178 lb this morning. Reports she had to take extra Lasix at the end of May when she ate more for her husbands birthday.  Denies any swelling, shortness of breath or chest pains, Reports trying to follow a low sodium diet.  States she saw Dr. [name] 12/19/20 and got a good check up Case Manager Clinical Goal(s):  patient will weigh self daily and record patient will verbalize understanding of Heart Failure Action Plan and when to call doctor patient will take all Heart Failure mediations as prescribed patient will weigh daily and record (notifying MD of 3 lb weight gain over night or 5 lb in a week) Interventions:  Collaboration with [name], MD regarding development and update of comprehensive plan of care as evidenced by provider attestation and co-signature Inter-disciplinary care team collaboration (see longitudinal plan of care) Reviewed upcoming appointment with cardiology Dr. [name] in December Basic  overview and discussion of pathophysiology of Heart Failure reviewed  Reinforced verbal education on low sodium diet Reinforced Heart Failure Action Plan   Reinforced placing scale on hard, flat surface Discussed importance of daily weight and advised patient to weigh and record daily Reinforced role of diuretics in prevention of fluid overload and management of heart failure Patient Goals/Self-Care Activities - begin a heart failure diary - bring diary to all appointments - develop a rescue plan - eat more whole grains, fruits and vegetables, lean meats and healthy fats - follow rescue plan if symptoms flare-up - know when to call the doctor - track symptoms and what helps feel better or worse Follow Up Plan: Telephone follow up appointment with care management team member scheduled for: 02/08/21 at 9 AM The patient has been provided with contact information for the care management team and has been advised to call with any health related questions or concerns.      Care Plan : RNCM:Diabetes Type 2 (Adult)  Updates made by  [name], RN since 12/28/2020 12:00 AM     Problem: RNCM: Need for effective self management of Type 2 Diabetes   Priority: Medium     Long-Range Goal: RNCM:Effective self mangement of Type 2 Diabetes   Start Date: 08/10/2020  Expected End Date: 04/24/2021  This Visit's Progress: On track  Recent Progress: On track  Priority: Medium  Note:   Objective:  Lab Results  Component Value Date   HGBA1C 6.7 (A) 07/21/2020   Lab Results  Component Value Date   CREATININE 1.26 (H) 07/21/2020   CREATININE 1.24 (H) 05/23/2020   CREATININE 1.08 11/19/2019   No results found for: EGFR Current Barriers:  Knowledge Deficits related to basic Diabetes pathophysiology and self care/management Difficulty obtaining or cannot afford medications reports currently getting patient assistance for Farxiga, reports she still has not gotten her medication delivered and she  has spoken with the patient assistance program numerous times, states Dr. Bishop [place] office is trying to get assistance program set up for her.  States her mood is a lot better since she started the Wellbutrin  Does not adhere to provider recommendations re: ADA/low sodium diet and exercise at times Reports blood sugars have been ranging from 103-180 fasting denies any hypoglycemia, reports walking  once  time a week weather permitting and doing exercises that PT taught her several times a week to help with her chronic low back pain Case Manager Clinical Goal(s):  patient will demonstrate improved adherence to prescribed treatment plan for diabetes self care/management as evidenced by: daily monitoring and recording of CBG  adherence to ADA/ carb modified diet exercise 3-4 days/week adherence to prescribed medication regimen contacting provider for new or worsened symptoms or questions Interventions:  Collaboration with [name], MD regarding development and update of comprehensive plan of care as evidenced by provider attestation and co-signature Inter-disciplinary care team collaboration (see longitudinal plan of care)  Reviewed education about basic DM disease process Reviewed medications with patient and discussed importance of medication adherence Discussed plans with patient for ongoing care management follow up and provided patient with direct contact information for care management team Reinforced education related to hypo and hyperglycemia and importance of correct treatment Reviewed scheduled/upcoming provider appointments including: Dr. Birdie Riddle 04/26/21, Dr. Kelton Pillar 01/18/21, Dr. Hollie Salk March 2023, Dr. [name] December 2022 Reinforced to check cbg daily and record, calling primary care provider for findings outside established parameters.   Review of patient status, including review of consultants reports, relevant laboratory and other test results, and medications completed. Patient  Goals/Self-Care Activities - Self administers oral medications as prescribed Attends all scheduled provider appointments Checks blood sugars as prescribed and utilize hyper and hypoglycemia protocol as needed Adheres to prescribed ADA/carb modified - check blood sugar at prescribed times - check blood sugar if I feel it is too high or too low - enter blood sugar readings and medication or insulin into daily log - take the blood sugar log to all doctor visits try walking 3-4 times a week and gradually increase time and number of days  -follow diet and fluid recommendations from kidney doctor Follow Up Plan: Telephone follow up appointment with care management team member scheduled for: 02/08/21 at 9 AM The patient has been provided with contact information for the care management team and has been advised to call with any health related questions or concerns.       Plan:Telephone follow up appointment with care management team member scheduled for:  02/08/21 and The patient  has been provided with contact information for the care management team and has been advised to call with any health related questions or concerns.  Peter Garter RN, BSN,CCM, CDE Care Management Coordinator [place] [phone], Mobile [phone]

## 2020-12-28 NOTE — Patient Instructions (Signed)
Visit Information  PATIENT GOALS:  Goals Addressed             This Visit's Progress    RNCM Monitor and Manage My Diabetes Type 2 and chronic kidney disease   On track    Timeframe:  Long-Range Goal Priority:  Medium Start Date:    08/10/20                         Expected End Date:     04/23/21                  Follow Up Date 02/08/2021    - check blood sugar at prescribed times - check blood sugar if I feel it is too high or too low - enter blood sugar readings and medication or insulin into daily log - take the blood sugar log to all doctor visits  -try walking 3-4 times a week and gradually increase time and number of days  -follow diet and fluid recommendations from kidney doctor   Why is this important?   Checking your blood sugar at home helps to keep it from getting very high or very low.  Writing the results in a diary or log helps the doctor know how to care for you.  Your blood sugar log should have the time, date and the results.  Also, write down the amount of insulin or other medicine that you take.  Other information, like what you ate, exercise done and how you were feeling, will also be helpful.     Notes:       RNCM:Better track and self manage heart failure symptoms   On track    Timeframe:  Long-Range Goal Priority:  High Start Date:  08/10/20                           Expected End Date: 04/24/21                      Follow Up Date 02/08/21    - begin a heart failure diary - bring diary to all appointments - develop a rescue plan - eat more whole grains, fruits and vegetables, lean meats and healthy fats - follow rescue plan if symptoms flare-up - know when to call the doctor - track symptoms and what helps feel better or worse - dress right for the weather, hot or cold    Why is this important?   You will be able to handle your symptoms better if you keep track of them.  Making some simple changes to your lifestyle will help.  Eating healthy is  one thing you can do to take good care of yourself.            Patient verbalizes understanding of instructions provided today and agrees to view in MyChart.   Telephone follow up appointment with care management team member scheduled for:  Dudley Major RN, [employer], CDE Care Management Coordinator [facility] [phone], Mobile [phone]

## 2020-12-28 NOTE — Progress Notes (Signed)
   Subjective:    Patient ID: Kayla Brown, female    DOB: 05-24-1951, 69 y.o.   MRN: [id number]  HPI Anxiety/depression- at last visit Wellbutrin 150mg  daily was added to her Sertraline 50mg  daily.  Pt feels like things are 'doing good'  Pt reports focus and task completion have improved- 'i get it all done in an easier manner'.  Pt feels anxiety is better and she's not as 'wound up'.  Husband has noticed improvement.  Pt reports feeling more calm.  Pt reports she has 'found a peace' with her sister-in-law.   Review of Systems For ROS see HPI   This visit occurred during the SARS-CoV-2 public health emergency.  Safety protocols were in place, including screening questions prior to the visit, additional usage of staff PPE, and extensive cleaning of exam room while observing appropriate contact time as indicated for disinfecting solutions.      Objective:   Physical Exam Vitals reviewed.  Constitutional:      General: She is not in acute distress.    Appearance: Normal appearance. She is not ill-appearing.  HENT:     Head: Normocephalic and atraumatic.  Skin:    General: Skin is warm and dry.  Neurological:     Mental Status: She is alert and oriented to person, place, and time.  Psychiatric:        Mood and Affect: Mood normal.        Behavior: Behavior normal.        Thought Content: Thought content normal.     Comments: Much more calm than previous visits          Assessment & Plan:  Anxiety/depression- improving.  Pt feels the addition of the Wellbutrin has been helpful.  She is less 'wound', more 'at peace', and able to get things done in 'a timely manner'.  Husband has noticed the improvement as well.  And after all these years, she is finally able to distance herself from toxic people and relationships- which she finds 'freeing'.  I applauded her new outlook and told her I was proud of her.  No med changes at this time.  Will follow.

## 2021-01-10 ENCOUNTER — Telehealth: Payer: Self-pay | Admitting: Pharmacist

## 2021-01-10 NOTE — Chronic Care Management (AMB) (Addendum)
Chronic Care Management Pharmacy Assistant   Name: Kayla Brown  MRN: [id number] DOB: [birth year]  Reason for Encounter: Diabetes Disease State Call    Recent office visits:  12/28/2020 OV PCP Neena Rhymes, MD; no medication changes indicated.  Recent consult visits:  12/19/2020 OV Cardiology [place], Pater M, MD; no medication changes indicated.  Hospital visits:  None in previous 6 months  Medications: Outpatient Encounter Medications as of 01/10/2021  Medication Sig   albuterol (VENTOLIN HFA) 108 (90 Base) MCG/ACT inhaler Inhale 1-2 puffs into the lungs every 6 (six) hours as needed for wheezing or shortness of breath.   Alcohol Swabs (ALCOHOL PREP) PADS Pt uses an alcohol pad each time sugars are tested. Pt tests twice daily. Dx E11.9   amLODipine (NORVASC) 10 MG tablet TAKE 1 TABLET BY MOUTH EVERY DAY   atorvastatin (LIPITOR) 40 MG tablet TAKE 1 TABLET BY MOUTH EVERY DAY   B Complex Vitamins (B COMPLEX PO) Take 1 tablet by mouth daily.   buPROPion (WELLBUTRIN XL) 150 MG 24 hr tablet Take 1 tablet (150 mg total) by mouth daily.   Calcium Carb-Cholecalciferol [phone] MG-UNIT CAPS Take 1 tablet by mouth 2 (two) times daily.   carvedilol (COREG) 25 MG tablet Take 1 tablet (25 mg total) by mouth 2 (two) times daily.   fish oil-omega-3 fatty acids 1000 MG capsule Take 1 g by mouth daily.   fluticasone (FLONASE) 50 MCG/ACT nasal spray SPRAY 2 SPRAYS INTO EACH NOSTRIL EVERY DAY (Patient taking differently: Place 2 sprays into both nostrils daily.)   furosemide (LASIX) 20 MG tablet TAKE 1 TABLET BY MOUTH EVERY DAY   gabapentin (NEURONTIN) 100 MG capsule Take 100 mg by mouth 2 (two) times daily.   glipiZIDE (GLUCOTROL) 5 MG tablet TAKE 1 TABLET (5 MG TOTAL) BY MOUTH 2 (TWO) TIMES DAILY BEFORE A MEAL.   glucose blood (FREESTYLE LITE) test strip USE TO TEST BLOOD GLUCOSE 2 TIMES DAILY. Dx E11.9   loratadine (CLARITIN) 10 MG tablet TAKE 1 TABLET BY MOUTH EVERY DAY (Patient taking  differently: Take 10 mg by mouth daily.)   Melatonin 3 MG TABS Take 10 mg by mouth at bedtime.    metFORMIN (GLUCOPHAGE-XR) 500 MG 24 hr tablet Take 1 tablet (500 mg total) by mouth in the morning and at bedtime. (Patient taking differently: Take 500 mg by mouth in the morning and at bedtime. Patient reports taking two pills twice a day.)   methocarbamol (ROBAXIN) 500 MG tablet Take 500 mg by mouth 2 (two) times daily.   Multiple Vitamins-Minerals (VISION FORMULA) TABS Take 1 tablet by mouth daily.   OVER THE COUNTER MEDICATION Take 1 tablet by mouth daily. Equate Vision Formula 50+   prednisoLONE acetate (PRED FORTE) 1 % ophthalmic suspension SMARTSIG:In Eye(s)   sertraline (ZOLOFT) 50 MG tablet TAKE 1 TABLET BY MOUTH EVERY DAY   traMADol (ULTRAM) 50 MG tablet Take 1 tablet (50 mg total) by mouth every 6 (six) hours as needed.   TRUEPLUS LANCETS 30G MISC Use one lancet each time sugars are tested. Pt tests twice daily. Dx. E11.9   UNABLE TO FIND Med Name: Healthwise talking meter   No facility-administered encounter medications on file as of 01/10/2021.   Recent Relevant Labs: Lab Results  Component Value Date/Time   HGBA1C 6.7 (A) 07/21/2020 09:44 AM   HGBA1C 7.2 (A) 03/04/2020 01:39 PM   HGBA1C 7.3 (H) 11/19/2019 02:50 PM   HGBA1C 8.0 (H) 03/06/2019 04:40 AM   HGBA1C 6.7 02/06/2016  12:00 AM   MICROALBUR <0.7 05/23/2020 02:31 PM   MICROALBUR 4.7 (H) 05/25/2019 01:52 PM    Kidney Function Lab Results  Component Value Date/Time   CREATININE 1.06 (H) 11/18/2020 02:36 PM   CREATININE 1.26 (H) 07/21/2020 10:09 AM   CREATININE 1.24 (H) 05/23/2020 02:31 PM   CREATININE 0.97 03/30/2015 01:48 PM   GFR 43.92 (L) 07/21/2020 10:09 AM   GFRNONAA 48 (L) 11/09/2019 02:33 PM   GFRAA 56 (L) 11/09/2019 02:33 PM    Current antihyperglycemic regimen:  Glipizide 5 mg two daily with meals Metformin 500 mg twice daily with meals Marcelline Deist (has not yet started - patient has been approved through  patient assistance. Has had issues with delivery and has not yet received medication for this year. Patient states she will start having the medication delivered to the doctors office instead of her home address.  What recent interventions/DTPs have been made to improve glycemic control:  None  Have there been any recent hospitalizations or ED visits since last visit with CPP? No  Patient denies hypoglycemic symptoms, including Pale, Sweaty, Shaky, Hungry, Nervous/irritable, and Vision changes  Patient denies hyperglycemic symptoms, including blurry vision, excessive thirst, fatigue, polyuria, and weakness  How often are you checking your blood sugar? once daily  What are your blood sugars ranging?  Fasting: 111 Before meals: 172, 200 After meals: n/a Bedtime: n/a  During the week, how often does your blood glucose drop below 70? Never Are you checking your feet daily/regularly? Yes, patient states she checks her feet regularly.  Adherence Review: Is the patient currently on a STATIN medication? Yes Is the patient currently on ACE/ARB medication? No Does the patient have >5 day gap between last estimated fill dates? No  Future Appointments  Date Time Provider Department Center  01/18/2021  8:10 AM Shamleffer, Konrad Dolores, MD [facility] None  01/25/2021  9:00 AM [facility] C[facility] PHARMACIST [facility]  02/03/2021  9:15 AM Helane Gunther, DPM [facility]  02/06/2021  7:50 AM [facility] MM 3 [facility]  02/08/2021  9:00 AM [facility] SF-[facility]M CARE MGR [facility]  04/26/2021  8:30 AM Sheliah Hatch, MD [facility]  05/29/2021  7:30 AM [facility] DX DEXA 1 GI-BCGDG [facility]-BRE[facility] CE  07/04/2021  9:00 AM Jetty Duhamel D, MD [facility] None     Star Rating Drugs: Atorvastatin 40 mg last filled 11/22/2020 90 DS Glipizide 5 mg last filled 12/18/2020 90 DS Metformin 500 mg last filled 12/24/2020 90 DS  April D Calhoun, [facility] Clinical Pharmacist Assistant [phone]

## 2021-01-11 ENCOUNTER — Telehealth: Payer: Medicare Other

## 2021-01-16 ENCOUNTER — Telehealth: Payer: Self-pay | Admitting: Internal Medicine

## 2021-01-16 DIAGNOSIS — G4733 Obstructive sleep apnea (adult) (pediatric): Secondary | ICD-10-CM

## 2021-01-16 NOTE — Telephone Encounter (Signed)
Tried calling the pt and there was no answer- LMTCB.  

## 2021-01-17 NOTE — Telephone Encounter (Signed)
Called and spoke with patient's husband who states that she needs order sent to [employer] for Cpap machine. Per last OV note from Dr. Maple Hudson there was supposed to be an order placed, but I don't see one. Order has been placed. Husband expressed understanding. Nothing further needed at this time.

## 2021-01-17 NOTE — Telephone Encounter (Signed)
Patient is returning phone call. Patient phone number is [phone].

## 2021-01-18 ENCOUNTER — Encounter: Payer: Self-pay | Admitting: Internal Medicine

## 2021-01-18 ENCOUNTER — Other Ambulatory Visit: Payer: Self-pay

## 2021-01-18 ENCOUNTER — Ambulatory Visit (INDEPENDENT_AMBULATORY_CARE_PROVIDER_SITE_OTHER): Payer: Medicare Other | Admitting: Internal Medicine

## 2021-01-18 VITALS — BP 132/80 | HR 58 | Ht 61.0 in | Wt 178.8 lb

## 2021-01-18 DIAGNOSIS — E1122 Type 2 diabetes mellitus with diabetic chronic kidney disease: Secondary | ICD-10-CM

## 2021-01-18 DIAGNOSIS — Z794 Long term (current) use of insulin: Secondary | ICD-10-CM

## 2021-01-18 DIAGNOSIS — N1832 Chronic kidney disease, stage 3b: Secondary | ICD-10-CM | POA: Diagnosis not present

## 2021-01-18 DIAGNOSIS — E1165 Type 2 diabetes mellitus with hyperglycemia: Secondary | ICD-10-CM

## 2021-01-18 LAB — POCT GLYCOSYLATED HEMOGLOBIN (HGB A1C): Hemoglobin A1C: 7.2 % — AB (ref 4.0–5.6)

## 2021-01-18 MED ORDER — GLIPIZIDE 5 MG PO TABS: 5.0000 mg | ORAL_TABLET | Freq: Two times a day (BID) | ORAL | 3 refills | Status: DC

## 2021-01-18 MED ORDER — METFORMIN HCL ER 500 MG PO TB24: 500.0000 mg | ORAL_TABLET | Freq: Two times a day (BID) | ORAL | 3 refills | Status: DC

## 2021-01-18 NOTE — Progress Notes (Signed)
Name: Kayla Brown  Age/ Sex: 70 y.o., female   MRN/ DOB: [id number], 1950/12/21     PCP: Sheliah Hatch, MD   Reason for Endocrinology Evaluation: Type 2 Diabetes Mellitus  Initial Endocrine Consultative Visit: 04/06/2019    PATIENT IDENTIFIER: Kayla Brown is a 70 y.o. female with a past medical history of HTN, CHF and T2DM . The patient has followed with Endocrinology clinic since 04/06/2019 for consultative assistance with management of her diabetes.  DIABETIC HISTORY:  Kayla Brown was diagnosed with T2DM in 2001. She was unable to start Jardiance due to cost . Actos had to be stopped due to CHF. Her hemoglobin A1c has ranged from 6.7% in 2017, peaking at 8.1% in 2020  On her initial visit to our clinic she had an A1c of 8.0%. She was on [employer] and Metformin. We switched Glipizide XL to regular Glipizide and continued metformin   Was started on farxiga through 03/2020 Metformin reduced by 50% in 06/2020 due to GFR < 45  SUBJECTIVE:   During the last visit (07/21/2020): A1c of 6.7 %. We continued Glipizide and  Farxiga  and decreased Metformin due to low GFR   Today (04/18/2020): Kayla Brown is here for a follow up on diabetes management.  She checks her blood sugars 2 times daily. The patient has had one episode of hypoglycemia at 67 mg/dl     It took her a while to obtain [place] due to cost , so she didn't; get it until 01/05/2021.   Nephrology- Dr. Bufford Buttner  Denies nausea or diarrhea, has occasional constipation    HOME DIABETES REGIMEN:  Glipizide 5 mg, 1 tablet BID Metformin 500 mg tablet BID Farxiga 10 mg daily - pt assistance    GLUCOSE LOG:  Fasting 67-133 mg/dL  PM 140-272mg /dL    DIABETIC COMPLICATIONS: Microvascular complications:  CKD  Denies: neuropathy , retinopathy  Last eye exam: Completed 07/2020   Macrovascular complications:  CHF Denies: CAD, PVD, CVA  HISTORY:  Past Medical History:  Past Medical History:   Diagnosis Brown   Anxiety    Diabetes mellitus    Edema of extremities 03/02/2016   Essential hypertension, benign    Lazy eye    Major depressive disorder, single episode, unspecified    Obesity (BMI 30-39.9) 03/01/2016   OSA (obstructive sleep apnea)    Pulmonary HTN (HCC)    mild with PASP 05/01/2016 by echo 05/2016   Pure hypercholesterolemia    Retinopathy, due to hypertension    Past Surgical History:  Past Surgical History:  Procedure Laterality Brown   CPAP TITRATION  10/14/2015   DILATION AND CURETTAGE OF UTERUS  2005-2008   x2   EYE SURGERY     cataracts   WISDOM TOOTH EXTRACTION     Social History:  reports that she has never smoked. She has never used smokeless tobacco. She reports that she does not drink alcohol and does not use drugs. Family History:  Family History  Adopted: Yes  Problem Relation Age of Onset   Alcohol abuse Mother    ADD / ADHD Mother    Dementia Mother    Breast cancer Neg Hx      HOME MEDICATIONS: Allergies as of 01/18/2021   No Known Allergies      Medication List        Accurate as of January 18, 2021  8:22 AM. If you have any questions, ask your nurse or doctor.  albuterol 108 (90 Base) MCG/ACT inhaler Commonly known as: VENTOLIN HFA Inhale 1-2 puffs into the lungs every 6 (six) hours as needed for wheezing or shortness of breath.   Alcohol Prep Pads Pt uses an alcohol pad each time sugars are tested. Pt tests twice daily. Dx E11.9   amLODipine 10 MG tablet Commonly known as: NORVASC TAKE 1 TABLET BY MOUTH EVERY DAY   atorvastatin 40 MG tablet Commonly known as: LIPITOR TAKE 1 TABLET BY MOUTH EVERY DAY   B COMPLEX PO Take 1 tablet by mouth daily.   buPROPion 150 MG 24 hr tablet Commonly known as: Wellbutrin XL Take 1 tablet (150 mg total) by mouth daily.   Calcium Carb-Cholecalciferol [phone] MG-UNIT Caps Take 1 tablet by mouth 2 (two) times daily.   carvedilol 25 MG tablet Commonly known as: COREG Take 1  tablet (25 mg total) by mouth 2 (two) times daily.   fish oil-omega-3 fatty acids 1000 MG capsule Take 1 g by mouth daily.   fluticasone 50 MCG/ACT nasal spray Commonly known as: FLONASE SPRAY 2 SPRAYS INTO EACH NOSTRIL EVERY DAY What changed: See the new instructions.   furosemide 20 MG tablet Commonly known as: LASIX TAKE 1 TABLET BY MOUTH EVERY DAY   gabapentin 100 MG capsule Commonly known as: NEURONTIN Take 100 mg by mouth 2 (two) times daily.   glipiZIDE 5 MG tablet Commonly known as: GLUCOTROL TAKE 1 TABLET (5 MG TOTAL) BY MOUTH 2 (TWO) TIMES DAILY BEFORE A MEAL.   glucose blood test strip Commonly known as: FREESTYLE LITE USE TO TEST BLOOD GLUCOSE 2 TIMES DAILY. Dx E11.9   loratadine 10 MG tablet Commonly known as: CLARITIN TAKE 1 TABLET BY MOUTH EVERY DAY   melatonin 3 MG Tabs tablet Take 10 mg by mouth at bedtime.   metFORMIN 500 MG 24 hr tablet Commonly known as: GLUCOPHAGE-XR Take 1 tablet (500 mg total) by mouth in the morning and at bedtime. What changed: additional instructions   methocarbamol 500 MG tablet Commonly known as: ROBAXIN Take 500 mg by mouth 2 (two) times daily.   OVER THE COUNTER MEDICATION Take 1 tablet by mouth daily. Equate Vision Formula 50+   prednisoLONE acetate 1 % ophthalmic suspension Commonly known as: PRED FORTE SMARTSIG:In Eye(s)   sertraline 50 MG tablet Commonly known as: ZOLOFT TAKE 1 TABLET BY MOUTH EVERY DAY   traMADol 50 MG tablet Commonly known as: ULTRAM Take 1 tablet (50 mg total) by mouth every 6 (six) hours as needed.   TRUEplus Lancets 30G Misc Use one lancet each time sugars are tested. Pt tests twice daily. Dx. E11.9   UNABLE TO FIND Med Name: Healthwise talking meter   Vision Formula Tabs Take 1 tablet by mouth daily.         OBJECTIVE:   Vital Signs: BP 132/80 (BP Location: Left Arm, Patient Position: Sitting, Cuff Size: Normal)   Pulse (!) 58   Ht 5\' 1"  (1.549 m)   Wt 178 lb 12.8 oz  (81.1 kg)   LMP 06/25/2008   SpO2 96%   BMI 33.78 kg/m   Wt Readings from Last 3 Encounters:  01/18/21 178 lb 12.8 oz (81.1 kg)  12/28/20 178 lb 12.8 oz (81.1 kg)  12/19/20 178 lb (80.7 kg)     Exam: General: Pt appears well and is in NAD  Neck: General: Supple without adenopathy. Thyroid: Thyroid size normal.  No goiter or nodules appreciated.  Lungs: Clear with good BS bilat with no rales,  rhonchi, or wheezes  Heart: RRR with normal S1 and S2 and no gallops; no murmurs; no rub  Extremities: No pretibial edema.   Neuro: MS is good with appropriate affect, pt is alert and Ox3   DM foot exam: 01/18/2021   The skin of the feet is without sores or ulcerations. The pedal pulses are 2+ on right and 2+ on left. The sensation is decreased to a screening 5.07, 10 gram monofilament bilaterally    DATA REVIEWED:  Lab Results  Component Value Brown   HGBA1C 7.2 (A) 01/18/2021   HGBA1C 6.7 (A) 07/21/2020   HGBA1C 7.2 (A) 03/04/2020   Lab Results  Component Value Brown   MICROALBUR <0.7 05/23/2020   LDLCALC 52 11/18/2020   CREATININE 1.06 (H) 11/18/2020   Lab Results  Component Value Brown   MICRALBCREAT 3.5 05/23/2020     Lab Results  Component Value Brown   CHOL 126 11/18/2020   HDL 52 11/18/2020   LDLCALC 52 11/18/2020   LDLDIRECT 50.0 05/23/2020   TRIG 141 11/18/2020   CHOLHDL 2.4 11/18/2020       Results for Kayla Brown, Kayla Brown (MRN [id number]) as of 01/18/2021 08:31  Ref. Range 11/18/2020 14:36  BASIC METABOLIC PANEL Unknown Rpt (A)  Sodium Latest Ref Range: 135 - 146 mmol/L 140  Potassium Latest Ref Range: 3.5 - 5.3 mmol/L 4.2  Chloride Latest Ref Range: 98 - 110 mmol/L 105  CO2 Latest Ref Range: 20 - 32 mmol/L 23  Glucose Latest Ref Range: 65 - 99 mg/dL 85  BUN Latest Ref Range: 7 - 25 mg/dL 40 (H)  Creatinine Latest Ref Range: 0.50 - 0.99 mg/dL 4.33 (H)  Calcium Latest Ref Range: 8.6 - 10.4 mg/dL 9.7  BUN/Creatinine Ratio Latest Ref Range: 6 - 22 (calc) 38 (H)  AG  Ratio Latest Ref Range: 1.0 - 2.5 (calc) 1.8  AST Latest Ref Range: 10 - 35 U/L 15  ALT Latest Ref Range: 6 - 29 U/L 20  Total Protein Latest Ref Range: 6.1 - 8.1 g/dL 6.4  Bilirubin, Direct Latest Ref Range: 0.0 - 0.2 mg/dL 0.1  Indirect Bilirubin Latest Ref Range: 0.2 - 1.2 mg/dL (calc) 0.2  Total Bilirubin Latest Ref Range: 0.2 - 1.2 mg/dL 0.3  Total CHOL/HDL Ratio Latest Ref Range: <5.0 (calc) 2.4  Cholesterol Latest Ref Range: <200 mg/dL 295  HDL Cholesterol Latest Ref Range: > OR = 50 mg/dL 52  LDL Cholesterol (Calc) Latest Units: mg/dL (calc) 52  Non-HDL Cholesterol (Calc) Latest Ref Range: <130 mg/dL (calc) 74  Triglycerides Latest Ref Range: <150 mg/dL 188  Alkaline phosphatase (APISO) Latest Ref Range: 37 - 153 U/L 61  Globulin Latest Ref Range: 1.9 - 3.7 g/dL (calc) 2.3    GFR 41.66  ASSESSMENT / PLAN / RECOMMENDATIONS:   1) Type 2 Diabetes Mellitus, Optimally controlled, With CKD III complications - Most recent A1c of 7.2 %. Goal A1c < 7.0%.     - A1c has trended up from 6.7% , most likely due to lack of Farxiga until recently.  - I have reduced her MEtformin in the past due to low GFR < 45. Despite improvement, will continue current dose  - BMP shows improved GFR    MEDICATIONS: Continue Glipizide 5 mg, 1 tablet BID  Decrease  Metformin 500 mg 1 tablet with Breakfast and 1 tablet with  Supper Continue Farxiga 10 mg, 1 tablet with breakfast    EDUCATION / INSTRUCTIONS: BG monitoring instructions: Patient is instructed to check her blood  sugars 1 times a day, fasting  Call [facility] if: BG persistently < 70  I reviewed the Rule of 15 for the treatment of hypoglycemia in detail with the patient. Literature supplied.     F/U in 4 months    Signed electronically by: Lyndle Herrlich, MD  [facility]  [facility] [address] [place] [place], [place] [address] Phone: [phone] FAX:  [phone]   CC: Sheliah Hatch, MD [address] [place] [address] Phone: [phone]  Fax: [phone]  Return to Endocrinology clinic as below: Future Appointments  Brown Time Provider Department Center  01/25/2021  9:00 AM [facility] CCM PHARMACIST [facility]  02/03/2021  9:15 AM Helane Gunther, DPM [facility]  02/06/2021  7:50 AM GI-BCG MM 3 GI-BCGMM GI-BREAST CE  02/08/2021  9:00 AM [facility] SF-CCM CARE [facility]GR [facility] P[facility]  04/26/2021  8:30 AM Sheliah Hatch, MD [facility] P[facility]  05/29/2021  7:30 AM GI-[facility]G DX DEXA 1 GI-BCGDG GI-BREAST CE  07/04/2021  9:00 AM Waymon Budge, MD [facility] None

## 2021-01-18 NOTE — Patient Instructions (Addendum)
-    Glipizide 5 mg, 1 tablet Before breakfast and 1 tablet before supper  -  Metformin 500 mg , 1 tablet with Breakfast and 1 tablet with  Supper - Farxiga 10 mg, 1 tablet daily with Breakfast     - HOW TO TREAT LOW BLOOD SUGARS (Blood sugar LESS THAN 70 MG/DL) Please follow the RULE OF 15 for the treatment of hypoglycemia treatment (when your (blood sugars are less than 70 mg/dL)   STEP 1: Take 15 grams of carbohydrates when your blood sugar is low, which includes:  3-4 GLUCOSE TABS  OR 3-4 OZ OF JUICE OR REGULAR SODA OR ONE TUBE OF GLUCOSE GEL    STEP 2: RECHECK blood sugar in 15 MINUTES STEP 3: If your blood sugar is still low at the 15 minute recheck --> then, go back to STEP 1 and treat AGAIN with another 15 grams of carbohydrates

## 2021-01-19 ENCOUNTER — Other Ambulatory Visit: Payer: Self-pay | Admitting: Family Medicine

## 2021-01-24 ENCOUNTER — Other Ambulatory Visit: Payer: Self-pay | Admitting: Cardiology

## 2021-01-25 ENCOUNTER — Ambulatory Visit (INDEPENDENT_AMBULATORY_CARE_PROVIDER_SITE_OTHER): Payer: Medicare Other

## 2021-01-25 DIAGNOSIS — F32A Depression, unspecified: Secondary | ICD-10-CM

## 2021-01-25 DIAGNOSIS — I5032 Chronic diastolic (congestive) heart failure: Secondary | ICD-10-CM

## 2021-01-25 DIAGNOSIS — F419 Anxiety disorder, unspecified: Secondary | ICD-10-CM

## 2021-01-25 DIAGNOSIS — E1122 Type 2 diabetes mellitus with diabetic chronic kidney disease: Secondary | ICD-10-CM

## 2021-01-25 DIAGNOSIS — N1832 Chronic kidney disease, stage 3b: Secondary | ICD-10-CM

## 2021-01-25 NOTE — Patient Instructions (Addendum)
Kayla Brown,  Thank you for talking with me today. I have included our care plan/goals in the following pages.   Please review and call me at [phone] with any questions.  Thanks! [facility]   Care Plan : [facility] Care Plan  Updates made by Dahlia Byes, [facility] since 01/25/2021 12:00 AM     Problem: Hypertension, Hyperlipidemia, Diabetes, Heart Failure and Chronic Kidney Disease   Priority: High     Long-Range Goal: Disease Management   Start Date: 08/25/2020  Expected End Date: 08/25/2021  This Visit's Progress: On track  Recent Progress: On track  Priority: High  Note:   Current Barriers:  Unable to independently afford treatment regimen  Pharmacist Clinical Goal(s):  Patient will contact provider office for questions/concerns as evidenced notation of same in electronic health record through collaboration with PharmD and provider.   Interventions: 1:1 collaboration with Sheliah Hatch, MD regarding development and update of comprehensive plan of care as evidenced by provider attestation and co-signature Inter-disciplinary care team collaboration (see longitudinal plan of care) Comprehensive medication review performed; medication list updated in electronic medical record  CHF: (goal: minimize symptoms, ensure appropriate medication use) -Controlled -Consistent with daily weight and log. Has not required additional furosemide since may 2022.  Denies frequent weight gain >3lb, had taken additional furosemide dose just once over past two months. -Current treatment: Furosemide 20 mg once daily Carvedilol 25 mg twice daily  -Educated on appropriate use of furosemide -Counseled on diet and exercise extensively Recommended to continue current medication  Depression (Goal: minimize symptoms and medication related side effects) -Controlled -Reports a very positive response after starting back on bupropion. No side effects noted.  -Current treatment: Sertraline 50 mg once  daily Bupropion 150 mg XL once daily  -PHQ9: 0 (August 2022) -Educated on Benefits of medication for symptom control Reviewed side effects of bupropion at length -Counseled on diet and exercise extensively Recommended to continue current medication  Diabetes (A1c goal <7%) -Not ideally controlled -Now getting Farxiga PAP sent to Dr Beaulah Corin office. Reports some late snack snacking but has gotten back into better eating habbits -Current medications: Farxiga 10 mg once daily  Glipizide 5 mg twice daily before a meal Metformin XR 500 mg twice daily  -Medications previously tried:   -Current home glucose readings:  August 2022 - 100s FBGs. Some PPD >200s attributed to snacking June 2022 - 141, 179, 211, 143 Denies hypoglycemic/hyperglycemic symptoms -Current exercise: walks around apartment -Educated on A1c and blood sugar goals; Benefits of routine self-monitoring of blood sugar; -Counseled on diet and exercise extensively Recommended to continue current medication     The patient verbalized understanding of instructions provided today and agreed to receive a MyChart copy of patient instruction and/or educational materials. Telephone follow up appointment with pharmacy team member scheduled for: See next appointment with "Care Management Staff" under "What's Next" below.

## 2021-01-25 NOTE — Progress Notes (Signed)
Chronic Care Management Pharmacy Note  01/25/2021 Name:  Kayla Brown MRN:  [id number] DOB:  1950-09-21  Subjective: Kayla Brown is an 70 y.o. year old female who is a primary patient of [name], [name], MD.  The CCM team was consulted for assistance with disease management and care coordination needs.    Engaged with patient by telephone for follow up visit in response to provider referral for pharmacy case management and/or care coordination services.   Consent to Services:  The patient was given information about Chronic Care Management services, agreed to services, and gave verbal consent prior to initiation of services.  Please see initial visit note for detailed documentation.   Patient Care Team: [name], MD as PCP - General (Family Medicine) [name], MD (Inactive) as Consulting Physician (Gastroenterology) [name], MD as Consulting Physician (Gynecology) [name], [facility] as Pharmacist (Pharmacist) [name], RN as Case Manager  Objective:  Lab Results  Component Value Date   CREATININE 1.06 (H) 11/18/2020   CREATININE 1.26 (H) 07/21/2020   CREATININE 1.24 (H) 05/23/2020    Lab Results  Component Value Date   HGBA1C 7.2 (A) 01/18/2021   Last diabetic Eye exam:  Lab Results  Component Value Date/Time   HMDIABEYEEXA No Retinopathy 08/01/2020 03:45 PM    Last diabetic Foot exam:  Lab Results  Component Value Date/Time   HMDIABFOOTEX completed 01/24/2016 12:00 AM        Component Value Date/Time   CHOL 126 11/18/2020 1436   TRIG 141 11/18/2020 1436   HDL 52 11/18/2020 1436   CHOLHDL 2.4 11/18/2020 1436   VLDL 46.4 (H) 05/23/2020 1431   LDLCALC 52 11/18/2020 1436   LDLDIRECT 50.0 05/23/2020 1431    Hepatic Function Latest Ref Rng & Units 11/18/2020 05/23/2020 11/19/2019  Total Protein 6.1 - 8.1 g/dL 6.4 6.6 6.9  Albumin 3.5 - 5.2 g/dL - 4.3 4.5  AST 10 - 35 U/L 15 18 13   ALT 6 - 29 U/L 20 29 16   Alk Phosphatase  39 - 117 U/L - 61 64  Total Bilirubin 0.2 - 1.2 mg/dL 0.3 0.3 0.3  Bilirubin, Direct 0.0 - 0.2 mg/dL 0.1 0.1 0.1    Lab Results  Component Value Date/Time   TSH 1.19 11/18/2020 02:36 PM   TSH 1.32 05/23/2020 02:31 PM    CBC Latest Ref Rng & Units 11/18/2020 05/23/2020 11/19/2019  WBC 3.8 - 10.8 Thousand/uL 10.5 10.0 10.4  Hemoglobin 11.7 - 15.5 g/dL 11.7 13.0 12.7  Hematocrit 35.0 - 45.0 % 35.7 39.4 37.5  Platelets 140 - 400 Thousand/uL 268 287.0 294.0    No results found for: VD25OH  Clinical ASCVD:  The ASCVD Risk score Mikey Bussing DC Jr., et al., 2013) failed to calculate for the following reasons:   The valid total cholesterol range is 130 to 320 mg/dL   Social History   Tobacco Use  Smoking Status Never  Smokeless Tobacco Never   BP Readings from Last 3 Encounters:  01/18/21 132/80  12/28/20 120/70  12/19/20 130/60   Pulse Readings from Last 3 Encounters:  01/18/21 (!) 58  12/28/20 (!) 57  12/19/20 (!) 53   Wt Readings from Last 3 Encounters:  01/18/21 178 lb 12.8 oz (81.1 kg)  12/28/20 178 lb 12.8 oz (81.1 kg)  12/19/20 178 lb (80.7 kg)    Assessment: Review of patient past medical history, allergies, medications, health status, including review of consultants reports, laboratory and other test data, was performed  as part of comprehensive evaluation and provision of chronic care management services.   SDOH:  (Social Determinants of Health) assessments and interventions performed: Yes   CCM Care Plan  No Known Allergies  Medications Reviewed Today     Reviewed by [name], [facility] (Pharmacist) on 01/25/21 at [place] List Status: <None>   Medication Order Taking? Sig Documenting Provider Last Dose Status Informant  albuterol (VENTOLIN HFA) 108 (90 Base) MCG/ACT inhaler [id number]  Inhale 1-2 puffs into the lungs every 6 (six) hours as needed for wheezing or shortness of breath. [provider]  Active   Alcohol Swabs (ALCOHOL PREP) PADS [id number]  Pt  uses an alcohol pad each time sugars are tested. Pt tests twice daily. Dx E11.9 [name], MD  Active Spouse/Significant Other  amLODipine (NORVASC) 10 MG tablet [id number]  TAKE 1 TABLET BY MOUTH EVERY DAY [name], MD  Active   atorvastatin (LIPITOR) 40 MG tablet [id number]  TAKE 1 TABLET BY MOUTH EVERY DAY [name], MD  Active   B Complex Vitamins (B COMPLEX PO) [id number]  Take 1 tablet by mouth daily. [provider]  Active   buPROPion (WELLBUTRIN XL) 150 MG 24 hr tablet [id number]  Take 1 tablet (150 mg total) by mouth daily. [name], MD  Active   Calcium Carb-Cholecalciferol [phone] MG-UNIT CAPS [id number]  Take 1 tablet by mouth 2 (two) times daily. [provider]  Active   carvedilol (COREG) 25 MG tablet [id number]  TAKE 1 TABLET BY MOUTH TWICE A DAY [name], [name], MD  Active   dapagliflozin propanediol (FARXIGA) 10 MG TABS tablet [id number] Yes Take 10 mg by mouth daily. [provider]  Active Self           Med Note Leroy Sea, Nabilah Davoli   Wed Jan 25, 2021  9:18 AM) Gets through patient assistance.   fish oil-omega-3 fatty acids 1000 MG capsule [id number]  Take 1 g by mouth daily. [provider]  Active Spouse/Significant Other  fluticasone (FLONASE) 50 MCG/ACT nasal spray [id number]  SPRAY 2 SPRAYS INTO EACH NOSTRIL EVERY DAY  Patient taking differently: Place 2 sprays into both nostrils daily.   [name], MD  Active   furosemide (LASIX) 20 MG tablet [id number]  TAKE 1 TABLET BY MOUTH EVERY DAY [name], MD  Active   gabapentin (NEURONTIN) 100 MG capsule [id number]  Take 100 mg by mouth 2 (two) times daily. [provider]  Active   glipiZIDE (GLUCOTROL) 5 MG tablet [id number]  Take 1 tablet (5 mg total) by mouth 2 (two) times daily before a meal. Shamleffer, Melanie Crazier, MD  Active   glucose blood (FREESTYLE LITE) test strip [id number]  USE TO TEST BLOOD GLUCOSE 2 TIMES DAILY. Dx E11.9  [name], MD  Active Spouse/Significant Other  loratadine (CLARITIN) 10 MG tablet [id number]  TAKE 1 TABLET BY MOUTH EVERY DAY  Patient taking differently: Take 10 mg by mouth daily.   [name], MD  Active   Melatonin 3 MG TABS [id number]  Take 10 mg by mouth at bedtime.  [provider]  Active Spouse/Significant Other  metFORMIN (GLUCOPHAGE-XR) 500 MG 24 hr tablet [id number]  Take 1 tablet (500 mg total) by mouth in the morning and at bedtime. Shamleffer, Melanie Crazier, MD  Active   methocarbamol (ROBAXIN) 500 MG tablet [id number]  Take 500 mg by mouth 2 (two) times daily. [provider]  Active   Multiple  Vitamins-Minerals (VISION FORMULA) TABS [id number]  Take 1 tablet by mouth daily. [provider]  Active Spouse/Significant Other  OVER THE COUNTER MEDICATION [id number]  Take 1 tablet by mouth daily. Equate Vision Formula 50+ [provider]  Active Spouse/Significant Other  prednisoLONE acetate (PRED FORTE) 1 % ophthalmic suspension [id number]  SMARTSIG:In Eye(s) [provider]  Active   sertraline (ZOLOFT) 50 MG tablet [id number]  TAKE 1 TABLET BY MOUTH EVERY DAY [name], MD  Active   traMADol (ULTRAM) 50 MG tablet [id number]  Take 1 tablet (50 mg total) by mouth every 6 (six) hours as needed. Aundra Dubin, PA-C  Active   TRUEPLUS LANCETS 30G [place] [id number]  Use one lancet each time sugars are tested. Pt tests twice daily. Dx. E11.9 [name], MD  Active Spouse/Significant Other  UNABLE TO FIND [id number]  Med Name: [place] talking meter Shamleffer, Melanie Crazier, MD  Active             Patient Active Problem List   Diagnosis Date Noted   Morbid obesity ([place]) 11/18/2020   Osteopenia 11/18/2020   Type 2 diabetes mellitus with stage 3a chronic kidney disease, without long-term current use of insulin ([place]) 07/21/2020   Type 2 diabetes mellitus with hyperglycemia, with long-term current use of  insulin ([place]) 04/19/2020   Lumbar radiculopathy 03/02/2020   Left hip pain 12/01/2019   Type 2 diabetes mellitus with stage 3b chronic kidney disease, without long-term current use of insulin ([place]) 82/42/3536   Diastolic CHF ([place]) 14/43/1540   Dyspnea 03/08/2019   Pulmonary edema 03/04/2019   Physical exam 02/06/2017   Anxiety and depression 12/05/2016   Obesity (BMI 30-39.9) 03/01/2016   OSA (obstructive sleep apnea) 07/11/2015   Pulmonary hypertension ([place]) 05/06/2015   Heart murmur 03/03/2015   Essential hypertension, benign 03/18/2014   Hyperlipidemia 03/18/2014   Post-menopause on HRT (hormone replacement therapy) 03/18/2014    Immunization History  Administered Date(s) Administered   Fluad Quad(high Dose 65+) 02/16/2019, 03/28/2020   Influenza,inj,Quad PF,6+ Mos 04/24/2016, 03/04/2017, 03/20/2018   PFIZER(Purple Top)SARS-COV-2 Vaccination 09/24/2019, 10/15/2019, 04/25/2020   Pneumococcal Conjugate-13 07/06/2016   Pneumococcal Polysaccharide-23 07/01/2017   Tdap 09/23/2009    Conditions to be addressed/monitored: CHF, HTN, HLD, Hypertriglyceridemia, DMII, CKD Stage 3-4, Anxiety, and Depression  Care Plan : [place]  Updates made by [name], [place] since 01/25/2021 12:00 AM     Problem: Hypertension, Hyperlipidemia, Diabetes, Heart Failure and Chronic Kidney Disease   Priority: High     Long-Range Goal: Disease Management   Start Date: 08/25/2020  Expected End Date: 08/25/2021  Recent Progress: On track  Priority: High  Note:    Current Barriers:  Unable to independently afford treatment regimen  Pharmacist Clinical Goal(s):  Patient will contact provider office for questions/concerns as evidenced notation of same in electronic health record through collaboration with PharmD and provider.   Interventions: 1:1 collaboration with [name], MD regarding development and update of comprehensive plan of care as evidenced by provider attestation  and co-signature Inter-disciplinary care team collaboration (see longitudinal plan of care) Comprehensive medication review performed; medication list updated in electronic medical record  CHF: (goal: minimize symptoms, ensure appropriate medication use) -Controlled -Consistent with daily weight and log. Has not required additional furosemide since may 2022.  Denies frequent weight gain >3lb, had taken additional furosemide dose just once over past two months. -Current treatment: Furosemide 20 mg once daily Carvedilol 25 mg twice daily  -Educated on appropriate  use of furosemide -Counseled on diet and exercise extensively Recommended to continue current medication  Depression (Goal: minimize symptoms and medication related side effects) -Controlled -Reports a very positive response after starting back on bupropion. No side effects noted at this time. -Current treatment: Sertraline 50 mg once daily Bupropion 150 mg XL once daily  -PHQ9: 0 (August 2022) -Educated on Benefits of medication for symptom control Recommended to continue current medication  Diabetes (A1c goal <7%) -Not ideally controlled -Now getting Wilder Glade Rx through PAP sent to Dr Bishop [place] office. Reports some late snack snacking but has gotten back into better eating habbits -Current medications: Farxiga 10 mg once daily  Glipizide 5 mg twice daily before a meal Metformin XR 500 mg twice daily  -Medications previously tried:   -Current home glucose readings:  August 2022 - 100s FBGs. Some PPD >200s attributed to snacking June 2022 - 141, 179, 211, 143 Denies hypoglycemic/hyperglycemic symptoms -Current exercise: walks around apartment -Educated on A1c and blood sugar goals; Benefits of routine self-monitoring of blood sugar; -Counseled on diet and exercise extensively Recommended to continue current medication   Medication Assistance:  Wilder Glade obtained through [employer] medication assistance program.  Enrollment ends  05/2021  Patient's preferred pharmacy is:  [id number] [id number]: [phone]: [phone] Follow Up:  Patient agrees to Care Plan and Follow-up.  Plan:  2 month [place] visit scheduled  Future Appointments  Date Time Provider [place] 01/25/2021  9:00 AM [facility] CCM PHARMACIST [facility] PEC  02/03/2021  9:15 AM MGardiner Barefoot DPM [facility]  02/06/2021  7:50 AM GI-BCG MM 3 GI-BCGMM GI-BREAST CE  02/08/2021  9:00 AM [facility] SF-CCM CARE MGR [facility] PEC  04/26/2021  8:30 AM TMidge Minium MD [facility] PEC  05/22/2021  8:30 AM Shamleffer, IMelanie Crazier MD LBPC-LBENDO None  05/29/2021  7:30 AM GI-BCG DX DEXA 1 GI-BCGDG GI-BREAST CE  07/04/2021  9:00 AM YDeneise Lever MD LBPU-PULCARE None   JMadelin Rear PharmD, CPP Clinical Pharmacist Practitioner  [facility]  [phone]

## 2021-01-30 ENCOUNTER — Ambulatory Visit (INDEPENDENT_AMBULATORY_CARE_PROVIDER_SITE_OTHER): Payer: Medicare Other | Admitting: Adult Health

## 2021-01-30 ENCOUNTER — Encounter: Payer: Self-pay | Admitting: Adult Health

## 2021-01-30 ENCOUNTER — Other Ambulatory Visit: Payer: Self-pay

## 2021-01-30 DIAGNOSIS — J4520 Mild intermittent asthma, uncomplicated: Secondary | ICD-10-CM | POA: Diagnosis not present

## 2021-01-30 DIAGNOSIS — E669 Obesity, unspecified: Secondary | ICD-10-CM

## 2021-01-30 DIAGNOSIS — I5032 Chronic diastolic (congestive) heart failure: Secondary | ICD-10-CM

## 2021-01-30 DIAGNOSIS — I272 Pulmonary hypertension, unspecified: Secondary | ICD-10-CM | POA: Diagnosis not present

## 2021-01-30 DIAGNOSIS — G4733 Obstructive sleep apnea (adult) (pediatric): Secondary | ICD-10-CM | POA: Diagnosis not present

## 2021-01-30 DIAGNOSIS — J45909 Unspecified asthma, uncomplicated: Secondary | ICD-10-CM | POA: Insufficient documentation

## 2021-01-30 NOTE — Addendum Note (Signed)
Addended by: Dorisann Frames R on: 01/30/2021 10:53 AM   Modules accepted: Orders

## 2021-01-30 NOTE — Assessment & Plan Note (Signed)
Appears euvolemic on exam.  Continue on current regimen.  Low-salt diet.

## 2021-01-30 NOTE — Assessment & Plan Note (Signed)
Very severe obstructive sleep apnea with excellent control compliance on nocturnal CPAP.  Her CPAP machine needs to be replaced.  Order for new machine to homecare company  Plan  Patient Instructions  Use Albuterol Inhaler 2 puffs every 4hrs as needed for wheezing /shortness of breath.  -this is your rescue inhaler  Low salt diet .  Continue on CPAP At bedtime   Order for new CPAP machine  Do not drive if sleepy  Work on healthy weight loss .  Follow up with Dr. Maple Hudson  In 6  months and As needed   Please contact office for sooner follow up if symptoms do not improve or worsen or seek emergency care

## 2021-01-30 NOTE — Patient Instructions (Signed)
Use Albuterol Inhaler 2 puffs every 4hrs as needed for wheezing /shortness of breath.  -this is your rescue inhaler  Low salt diet .  Continue on CPAP At bedtime   Order for new CPAP machine  Do not drive if sleepy  Work on healthy weight loss .  Follow up with Dr. Maple Hudson  In 6  months and As needed   Please contact office for sooner follow up if symptoms do not improve or worsen or seek emergency care

## 2021-01-30 NOTE — Assessment & Plan Note (Signed)
Mild intermittent asthma/reactive airways.  Currently doing well.  No albuterol use.  Continue on current regimen

## 2021-01-30 NOTE — Assessment & Plan Note (Signed)
Healthy weight loss discussed 

## 2021-01-30 NOTE — Assessment & Plan Note (Signed)
Continue on current regimen.  Sleep apnea is well controlled.

## 2021-01-30 NOTE — Progress Notes (Signed)
_0  ID: Kayla Brown, female    DOB: 08-01-1950, 70 y.o.   MRN: [id number]  Chief Complaint  Patient presents with   Follow-up    Referring provider: [name], MD  HPI: 70 year old female never smoker (second hand smoke exposure)  followed for obstructive sleep apnea  Medical history significant for heart failure, diabetes, hypertension, pulmonary hypertension She is adopted, born prematurely at 6 months /2 pounds  TEST/EVENTS :  NPSG 05/14/16 AHI 93/ hr, desaturation to 84% PFT 04/04/15   - mild Diffusion defect, normal flows and volumes without response to bronchodilator PFT 04/24/2019- minimal obst w airtrapping, sl resp to BD, Nl DLCO 2D echo February 02, 2019 EF 40-98%, normal systolic function right ventricle, mild to moderately dilated left atrium, RV systolic function appeared to be normal with normal RV size 2D echo March 06, 2019 EF greater than 65%, moderate LVH, right ventricle normal systolic function, right ventricle systolic pressure could not be assessed.  CT chest March 05, 2019 extensive atelectatic changes throughout the lungs with a dependent predominance, mosaic attenuation throughout the lungs, emphysema  VQ scan 03/04/2019 no unmatched segmental perfusion defects to suggest acute PE Autoimmune work-up negative 2020 , SH /Occupational hx unrevealing   01/30/2021 Follow up : OSA  Patient presents for a follow-up visit.  She has underlying very severe obstructive sleep apnea is on nocturnal CPAP.  Patient says she is doing well on CPAP.  She feels rested with no significant daytime sleepiness.  She uses her CPAP every night and never misses any nights.  She says she cannot sleep without it.  CPAP download shows excellent compliance with 100% usage.  Daily average usage at 12 hours.  Patient is on CPAP 19 cm H2O.  AHI is 0.4. She is very concerned, because her machine is coming up with error that machine life has met her life expectancy . Needs a new  order for CPAP . Wants to get as soon as possible because she can not live without it.   Patient has had some shortness of breath in the past.  Treated for mild intermittent Asthma/RAD. PFT showed normal lung function and DLCO .  Does have CHF and Pulmonary Hypertension felt to contribute to her dyspnea . She does have an albuterol inhaler that she can use if needed.  Says she rarely uses her albuterol.  Denies any cough or wheezing.  She is a never smoker. Says she has been doing well with her breathing .   Has chronic rhinitis on Claritin and Flonase .   No Known Allergies  Immunization History  Administered Date(s) Administered   Fluad Quad(high Dose 65+) 02/16/2019, 03/28/2020   Influenza,inj,Quad PF,6+ Mos 04/24/2016, 03/04/2017, 03/20/2018   PFIZER(Purple Top)SARS-COV-2 Vaccination 09/24/2019, 10/15/2019, 04/25/2020   Pneumococcal Conjugate-13 07/06/2016   Pneumococcal Polysaccharide-23 07/01/2017   Tdap 09/23/2009    Past Medical History:  Diagnosis Date   Anxiety    Diabetes mellitus    Edema of extremities 03/02/2016   Essential hypertension, benign    Lazy eye    Major depressive disorder, single episode, unspecified    Obesity (BMI 30-39.9) 03/01/2016   OSA (obstructive sleep apnea)    Pulmonary HTN (HCC)    mild with PASP 35mHg by echo 05/2016   Pure hypercholesterolemia    Retinopathy, due to hypertension     Tobacco History: Social History   Tobacco Use  Smoking Status Never  Smokeless Tobacco Never   Counseling given: Not Answered   Outpatient Medications  Prior to Visit  Medication Sig Dispense Refill   albuterol (VENTOLIN HFA) 108 (90 Base) MCG/ACT inhaler Inhale 1-2 puffs into the lungs every 6 (six) hours as needed for wheezing or shortness of breath.     Alcohol Swabs (ALCOHOL PREP) PADS Pt uses an alcohol pad each time sugars are tested. Pt tests twice daily. Dx E11.9 100 each 3   amLODipine (NORVASC) 10 MG tablet TAKE 1 TABLET BY MOUTH EVERY DAY 90  tablet 1   atorvastatin (LIPITOR) 40 MG tablet TAKE 1 TABLET BY MOUTH EVERY DAY 90 tablet 1   B Complex Vitamins (B COMPLEX PO) Take 1 tablet by mouth daily.     buPROPion (WELLBUTRIN XL) 150 MG 24 hr tablet Take 1 tablet (150 mg total) by mouth daily. 30 tablet 3   Calcium Carb-Cholecalciferol [phone] MG-UNIT CAPS Take 1 tablet by mouth 2 (two) times daily.     carvedilol (COREG) 25 MG tablet TAKE 1 TABLET BY MOUTH TWICE A DAY 180 tablet 3   dapagliflozin propanediol (FARXIGA) 10 MG TABS tablet Take 10 mg by mouth daily.     fish oil-omega-3 fatty acids 1000 MG capsule Take 1 g by mouth daily.     fluticasone (FLONASE) 50 MCG/ACT nasal spray SPRAY 2 SPRAYS INTO EACH NOSTRIL EVERY DAY (Patient taking differently: Place 2 sprays into both nostrils daily.) 18 g 1   furosemide (LASIX) 20 MG tablet TAKE 1 TABLET BY MOUTH EVERY DAY 90 tablet 1   gabapentin (NEURONTIN) 100 MG capsule Take 100 mg by mouth 2 (two) times daily.     glipiZIDE (GLUCOTROL) 5 MG tablet Take 1 tablet (5 mg total) by mouth 2 (two) times daily before a meal. 180 tablet 3   glucose blood (FREESTYLE LITE) test strip USE TO TEST BLOOD GLUCOSE 2 TIMES DAILY. Dx E11.9 100 each 12   loratadine (CLARITIN) 10 MG tablet TAKE 1 TABLET BY MOUTH EVERY DAY (Patient taking differently: Take 10 mg by mouth daily.) 30 tablet 11   Melatonin 3 MG TABS Take 10 mg by mouth at bedtime.      metFORMIN (GLUCOPHAGE-XR) 500 MG 24 hr tablet Take 1 tablet (500 mg total) by mouth in the morning and at bedtime. 180 tablet 3   methocarbamol (ROBAXIN) 500 MG tablet Take 500 mg by mouth 2 (two) times daily.     Multiple Vitamins-Minerals (VISION FORMULA) TABS Take 1 tablet by mouth daily.     OVER THE COUNTER MEDICATION Take 1 tablet by mouth daily. Equate Vision Formula 50+     prednisoLONE acetate (PRED FORTE) 1 % ophthalmic suspension SMARTSIG:In Eye(s)     sertraline (ZOLOFT) 50 MG tablet TAKE 1 TABLET BY MOUTH EVERY DAY 90 tablet 1   traMADol (ULTRAM)  50 MG tablet Take 1 tablet (50 mg total) by mouth every 6 (six) hours as needed. 30 tablet 0   TRUEPLUS LANCETS 30G MISC Use one lancet each time sugars are tested. Pt tests twice daily. Dx. E11.9 100 each 3   UNABLE TO FIND Med Name: [employer] talking meter 1 kit 0   No facility-administered medications prior to visit.     Review of Systems:   Constitutional:   No  weight loss, night sweats,  Fevers, chills,  +fatigue, or  lassitude.  HEENT:   No headaches,  Difficulty swallowing,  Tooth/dental problems, or  Sore throat,                No sneezing, itching, ear ache, nasal congestion,  post nasal drip,   CV:  No chest pain,  Orthopnea, PND, swelling in lower extremities, anasarca, dizziness, palpitations, syncope.   GI  No heartburn, indigestion, abdominal pain, nausea, vomiting, diarrhea, change in bowel habits, loss of appetite, bloody stools.   Resp:.  No chest wall deformity  Skin: no rash or lesions.  GU: no dysuria, change in color of urine, no urgency or frequency.  No flank pain, no hematuria   MS:  No joint pain or swelling.  No decreased range of motion.  No back pain.    Physical Exam  BP 118/74 (BP Location: Left Arm, Patient Position: Sitting, Cuff Size: Normal)   Pulse (!) 55   Temp 98.7 F (37.1 C) (Oral)   Ht 5' (1.524 m)   Wt 175 lb (79.4 kg)   LMP 06/25/2008   SpO2 96%   BMI 34.18 kg/m   GEN: A/Ox3; pleasant , NAD, well nourished    HEENT:  [place]/AT,  EACs-clear, TMs-wnl, NOSE-clear, THROAT-clear, no lesions, no postnasal drip or exudate noted.   NECK:  Supple w/ fair ROM; no JVD; normal carotid impulses w/o bruits; no thyromegaly or nodules palpated; no lymphadenopathy.    RESP  Clear  P & A; w/o, wheezes/ rales/ or rhonchi. no accessory muscle use, no dullness to percussion  CARD:  RRR, no m/r/g, tr-1 peripheral edema, pulses intact, no cyanosis or clubbing.  GI:   Soft & nt; nml bowel sounds; no organomegaly or masses detected.   Musco: Warm  bil, no deformities or joint swelling noted.   Neuro: alert, no focal deficits noted.    Skin: Warm, no lesions or rashes        ProBNP No results found for: PROBNP  Imaging: No results found.    PFT Results Latest Ref Rng & Units 04/24/2019 04/04/2015  FVC-Pre L 2.23 2.63  FVC-Predicted Pre % 85 95  FVC-Post L 2.37 2.42  FVC-Predicted Post % 90 87  Pre FEV1/FVC % % 82 79  Post FEV1/FCV % % 86 88  FEV1-Pre L 1.83 2.08  FEV1-Predicted Pre % 91 98  FEV1-Post L 2.04 2.14  DLCO uncorrected ml/min/mmHg 18.69 12.85  DLCO UNC% % 109 68  DLCO corrected ml/min/mmHg 19.59 -  DLCO COR %Predicted % 114 -  DLVA Predicted % 85 82  TLC L 5.07 4.31  TLC % Predicted % 113 96  RV % Predicted % 120 90    No results found for: NITRICOXIDE      Assessment & Plan:   OSA (obstructive sleep apnea) Very severe obstructive sleep apnea with excellent control compliance on nocturnal CPAP.  Her CPAP machine needs to be replaced.  Order for new machine to [facility]  Patient Instructions  Use Albuterol Inhaler 2 puffs every 4hrs as needed for wheezing /shortness of breath.  -this is your rescue inhaler  Low salt diet .  Continue on CPAP At bedtime   Order for new CPAP machine  Do not drive if sleepy  Work on healthy weight loss .  Follow up with Dr. Annamaria Boots  In 6  months and As needed   Please contact office for sooner follow up if symptoms do not improve or worsen or seek emergency care       Obesity (BMI 30-39.9) Healthy weight loss discussed  Reactive airway disease Mild intermittent asthma/reactive airways.  Currently doing well.  No albuterol use.  Continue on current regimen  Pulmonary hypertension Continue on current regimen.  Sleep apnea  is well controlled.  Diastolic CHF ([place]) Appears euvolemic on exam.  Continue on current regimen.  Low-salt diet.     Rexene Edison, NP 01/30/2021

## 2021-02-03 ENCOUNTER — Ambulatory Visit (INDEPENDENT_AMBULATORY_CARE_PROVIDER_SITE_OTHER): Payer: Medicare Other | Admitting: Podiatry

## 2021-02-03 ENCOUNTER — Other Ambulatory Visit: Payer: Self-pay

## 2021-02-03 ENCOUNTER — Encounter: Payer: Self-pay | Admitting: Podiatry

## 2021-02-03 DIAGNOSIS — B351 Tinea unguium: Secondary | ICD-10-CM

## 2021-02-03 DIAGNOSIS — N1832 Chronic kidney disease, stage 3b: Secondary | ICD-10-CM

## 2021-02-03 DIAGNOSIS — M79675 Pain in left toe(s): Secondary | ICD-10-CM

## 2021-02-03 DIAGNOSIS — M79674 Pain in right toe(s): Secondary | ICD-10-CM | POA: Diagnosis not present

## 2021-02-03 DIAGNOSIS — E1122 Type 2 diabetes mellitus with diabetic chronic kidney disease: Secondary | ICD-10-CM

## 2021-02-03 NOTE — Progress Notes (Signed)
This patient returns to my office for at risk foot care.  This patient requires this care by a professional since this patient will be at risk due to having diabetes mellitus.  This patient is unable to cut nails herself since the patient cannot reach her nails.These nails are painful walking and wearing shoes.  This patient presents for at risk foot care today.  General Appearance  Alert, conversant and in no acute stress.  Vascular  Dorsalis pedis and posterior tibial  pulses are palpable  bilaterally.  Capillary return is within normal limits  bilaterally. Temperature is within normal limits  bilaterally.  Neurologic  Senn-Weinstein monofilament wire test within normal limits  bilaterally. Muscle power within normal limits bilaterally.  Nails Thick disfigured discolored nails with subungual debris  from hallux to fifth toes bilaterally. No evidence of bacterial infection or drainage bilaterally.  Orthopedic  No limitations of motion  feet .  No crepitus or effusions noted.  No bony pathology or digital deformities noted.  HAV  B/L.  Hammer toes  B/L.  Skin  normotropic skin with no porokeratosis noted bilaterally.  No signs of infections or ulcers noted.     Onychomycosis  Pain in right toes  Pain in left toes  Consent was obtained for treatment procedures.   Mechanical debridement of nails 1-5  bilaterally performed with a nail nipper.  Filed with dremel without incident.    Return office visit                     Told patient to return for periodic foot care and evaluation due to potential at risk complications.   Helane Gunther DPM

## 2021-02-06 ENCOUNTER — Ambulatory Visit
Admission: RE | Admit: 2021-02-06 | Discharge: 2021-02-06 | Disposition: A | Discharge status: Home / Self Care | Payer: Medicare Other | Source: Ambulatory Visit | Attending: Family Medicine | Admitting: Family Medicine

## 2021-02-06 ENCOUNTER — Other Ambulatory Visit: Payer: Self-pay

## 2021-02-06 DIAGNOSIS — Z1231 Encounter for screening mammogram for malignant neoplasm of breast: Secondary | ICD-10-CM | POA: Diagnosis not present

## 2021-02-08 ENCOUNTER — Ambulatory Visit: Payer: Medicare Other

## 2021-02-08 DIAGNOSIS — I5032 Chronic diastolic (congestive) heart failure: Secondary | ICD-10-CM

## 2021-02-08 DIAGNOSIS — I10 Essential (primary) hypertension: Secondary | ICD-10-CM

## 2021-02-08 DIAGNOSIS — G4733 Obstructive sleep apnea (adult) (pediatric): Secondary | ICD-10-CM

## 2021-02-08 DIAGNOSIS — F419 Anxiety disorder, unspecified: Secondary | ICD-10-CM | POA: Diagnosis not present

## 2021-02-08 DIAGNOSIS — N1832 Chronic kidney disease, stage 3b: Secondary | ICD-10-CM | POA: Diagnosis not present

## 2021-02-08 DIAGNOSIS — E1122 Type 2 diabetes mellitus with diabetic chronic kidney disease: Secondary | ICD-10-CM

## 2021-02-08 DIAGNOSIS — F32A Depression, unspecified: Secondary | ICD-10-CM

## 2021-02-08 NOTE — Chronic Care Management (AMB) (Signed)
Chronic Care Management   CCM RN Visit Note  02/08/2021 Name: Kayla Brown MRN: [id number] DOB: 25-Jan-1951  Subjective: Kayla Brown is a 70 y.o. year old female who is a primary care patient of Kayla Brown, Kayla Millet, MD. The care management team was consulted for assistance with disease management and care coordination needs.    Engaged with patient by telephone for follow up visit in response to provider referral for case management and/or care coordination services.   Consent to Services:  The patient was given information about Chronic Care Management services, agreed to services, and gave verbal consent prior to initiation of services.  Please see initial visit note for detailed documentation.   Patient agreed to services and verbal consent obtained.   Assessment: Review of patient past medical history, allergies, medications, health status, including review of consultants reports, laboratory and other test data, was performed as part of comprehensive evaluation and provision of chronic care management services.   SDOH (Social Determinants of Health) assessments and interventions performed:    CCM Care Plan  No Known Allergies  Outpatient Encounter Medications as of 02/08/2021  Medication Sig Note   albuterol (VENTOLIN HFA) 108 (90 Base) MCG/ACT inhaler Inhale 1-2 puffs into the lungs every 6 (six) hours as needed for wheezing or shortness of breath.    Alcohol Swabs (ALCOHOL PREP) PADS Pt uses an alcohol pad each time sugars are tested. Pt tests twice daily. Dx E11.9    amLODipine (NORVASC) 10 MG tablet TAKE 1 TABLET BY MOUTH EVERY DAY    atorvastatin (LIPITOR) 40 MG tablet TAKE 1 TABLET BY MOUTH EVERY DAY    B Complex Vitamins (B COMPLEX PO) Take 1 tablet by mouth daily.    buPROPion (WELLBUTRIN XL) 150 MG 24 hr tablet Take 1 tablet (150 mg total) by mouth daily.    Calcium Carb-Cholecalciferol [phone] MG-UNIT CAPS Take 1 tablet by mouth 2 (two) times daily.    carvedilol  (COREG) 25 MG tablet TAKE 1 TABLET BY MOUTH TWICE A DAY    dapagliflozin propanediol (FARXIGA) 10 MG TABS tablet Take 10 mg by mouth daily. 01/25/2021: Gets through patient assistance.    fish oil-omega-3 fatty acids 1000 MG capsule Take 1 g by mouth daily.    fluticasone (FLONASE) 50 MCG/ACT nasal spray SPRAY 2 SPRAYS INTO EACH NOSTRIL EVERY DAY (Patient taking differently: Place 2 sprays into both nostrils daily.)    furosemide (LASIX) 20 MG tablet TAKE 1 TABLET BY MOUTH EVERY DAY    gabapentin (NEURONTIN) 100 MG capsule Take 100 mg by mouth 2 (two) times daily.    glipiZIDE (GLUCOTROL) 5 MG tablet Take 1 tablet (5 mg total) by mouth 2 (two) times daily before a meal.    glucose blood (FREESTYLE LITE) test strip USE TO TEST BLOOD GLUCOSE 2 TIMES DAILY. Dx E11.9    loratadine (CLARITIN) 10 MG tablet TAKE 1 TABLET BY MOUTH EVERY DAY (Patient taking differently: Take 10 mg by mouth daily.)    Melatonin 3 MG TABS Take 10 mg by mouth at bedtime.     metFORMIN (GLUCOPHAGE-XR) 500 MG 24 hr tablet Take 1 tablet (500 mg total) by mouth in the morning and at bedtime.    methocarbamol (ROBAXIN) 500 MG tablet Take 500 mg by mouth 2 (two) times daily.    Multiple Vitamins-Minerals (VISION FORMULA) TABS Take 1 tablet by mouth daily.    OVER THE COUNTER MEDICATION Take 1 tablet by mouth daily. Equate Vision Formula 50+    prednisoLONE acetate (  PRED FORTE) 1 % ophthalmic suspension SMARTSIG:In Eye(s)    sertraline (ZOLOFT) 50 MG tablet TAKE 1 TABLET BY MOUTH EVERY DAY    traMADol (ULTRAM) 50 MG tablet Take 1 tablet (50 mg total) by mouth every 6 (six) hours as needed.    TRUEPLUS LANCETS 30G MISC Use one lancet each time sugars are tested. Pt tests twice daily. Dx. E11.9    UNABLE TO FIND Med Name: Healthwise talking meter    No facility-administered encounter medications on file as of 02/08/2021.    Patient Active Problem List   Diagnosis Date Noted   Reactive airway disease 01/30/2021   Morbid obesity  ([place]) 11/18/2020   Osteopenia 11/18/2020   Type 2 diabetes mellitus with stage 3a chronic kidney disease, without long-term current use of insulin ([place]) 07/21/2020   Type 2 diabetes mellitus with hyperglycemia, with long-term current use of insulin ([place]) 04/19/2020   Lumbar radiculopathy 03/02/2020   Left hip pain 12/01/2019   Type 2 diabetes mellitus with stage 3b chronic kidney disease, without long-term current use of insulin ([place]) 81/03/3158   Diastolic CHF ([place]) 45/85/9292   Dyspnea 03/08/2019   Pulmonary edema 03/04/2019   Physical exam 02/06/2017   Anxiety and depression 12/05/2016   Obesity (BMI 30-39.9) 03/01/2016   OSA (obstructive sleep apnea) 07/11/2015   Pulmonary hypertension ([place]) 05/06/2015   Heart murmur 03/03/2015   Essential hypertension, benign 03/18/2014   Hyperlipidemia 03/18/2014   Post-menopause on HRT (hormone replacement therapy) 03/18/2014    Conditions to be addressed/monitored:CHF, HTN, DMII, CKD Stage 3, and OSA  Care Plan : RNCM:Chronic Kidney (Adult)  Updates made by Kayla Ped, RN since 02/08/2021 12:00 AM     Problem: RNCM: Need for long term self management plan for chronic kidney disease   Priority: Medium     Long-Range Goal: RNCM : Disease Progression Prevented or Minimized   Start Date: 08/10/2020  Expected End Date: 04/24/2021  This Visit's Progress: On track  Recent Progress: On track  Priority: Medium  Note:   Current Barriers:  Knowledge Deficits related to Chronic kidney disease stage 3  now being followed by nephrology  Does not adhere to provider recommendations re: diet and fluids recommendations at times-reports avoiding dark sodas and drinking water instead States she will see Dr.Upton again in 8 months. reports B/P readings 118-130/60-80.  States she is now picking up her Farxiga at Dr. Bishop Brown office  Nurse Case Manager Clinical Goal(s):  patient will verbalize understanding of plan for self management of chronic  kidney disease patient will work with CCM team and primary care provider to address needs related to chronic kidney disease patient will attend all scheduled medical appointments: CCM PharmD 04/05/21,Dr. Birdie Riddle 04/26/21, Dr. Kelton Pillar 05/22/21  Dr.Upton March 2023 patient will verbalize basic understanding of chronic kidney disease process and self health management plan as evidenced by no worsening of kidney function, adherence to diet and fluid recommendations  patient will work with CM team pharmacist to self manage medications the patient will demonstrate ongoing self health care management ability as evidenced by adherence to plan of care, medications, diet and provider appts, call providers with problems or concerns*  Interventions:  1:1 collaboration with [name], MD regarding development and update of comprehensive plan of care as evidenced by provider attestation and co-signature Inter-disciplinary care team collaboration (see longitudinal plan of care) Evaluation of current treatment plan related to chronic kidney disease and patient's adherence to plan as established by provider. Reinforced education to patient  re: chronic kidney disease self management Reinforced importance of keeping blood sugar and B/P under control to help prevent further kidney damage Reviewed medications with patient and discussed to call CCM team for any problems or concerns Reviewed scheduled/upcoming provider appointments including: CCM Pharm D 04/05/21, Dr. Birdie Riddle 04/26/21, Dr. Kelton Pillar 05/22/21, [facility] March 2023,  Discussed plans with patient for ongoing care management follow up and provided patient with direct contact information for care management team Patient Goals/Self-Care Activities  - Patient will self administer medications as prescribed Patient will attend all scheduled provider appointments Patient will call pharmacy for medication refills Patient will call provider  office for new concerns or questions -try walking 3-4 times a week and gradually increase time and number of days  -follow diet and fluid recommendations from kidney doctor  Follow Up Plan: Telephone follow up appointment with care management team member scheduled for: 03/15/21 at 10 AM The patient has been provided with contact information for the care management team and has been advised to call with any health related questions or concerns.          Care Plan : XHBZ:JIRCV Failure (Adult)  Updates made by Kayla Ped, RN since 02/08/2021 12:00 AM     Problem: RNCM: Knowledge deficit related to long term self management of heart failure   Priority: High     Long-Range Goal: Effective self mangement of heart failure   Start Date: 08/10/2020  Expected End Date: 04/24/2021  This Visit's Progress: On track  Recent Progress: On track  Priority: High  Note:   Current Barriers:  Does not adhere to provider recommendations re: diet and fluid recommendations at times Reports weighing daily with weight stable at 172.6 lb this morning. Reports she has not needed to take extra Lasix since the end of May.  Denies any swelling, shortness of breath or chest pains, Reports trying to follow a low sodium diet.  States she saw the pulmonary NP as her CPAP machine is getting to the end of its life.  States they have ordered a new machine but if may take some time to get it due to supply chain issues.   Case Manager Clinical Goal(s):  patient will weigh self daily and record patient will verbalize understanding of Heart Failure Action Plan and when to call doctor patient will take all Heart Failure mediations as prescribed patient will weigh daily and record (notifying MD of 3 lb weight gain over night or 5 lb in a week) Interventions:  Collaboration with [name], MD regarding development and update of comprehensive plan of care as evidenced by provider attestation and  co-signature Inter-disciplinary care team collaboration (see longitudinal plan of care) Reviewed upcoming appointment with cardiology Dr. [name] in December Basic overview and discussion of pathophysiology of Heart Failure reviewed  Reinforced verbal education on low sodium diet Reinforced Heart Failure Action Plan   Reinforced placing scale on hard, flat surface Discussed importance of daily weight and advised patient to weigh and record daily Reinforced role of diuretics in prevention of fluid overload and management of heart failure Reviewed importance of using CPAP for sleep apnea to help with her heart and B/P Patient Goals/Self-Care Activities - begin a heart failure diary - bring diary to all appointments - develop a rescue plan - eat more whole grains, fruits and vegetables, lean meats and healthy fats - follow rescue plan if symptoms flare-up - know when to call the doctor - track symptoms and what helps feel  better or worse Follow Up Plan: Telephone follow up appointment with care management team member scheduled for: 03/15/21 at 10 AM The patient has been provided with contact information for the care management team and has been advised to call with any health related questions or concerns.      Care Plan : RNCM:Diabetes Type 2 (Adult)  Updates made by Kayla Ped, RN since 02/08/2021 12:00 AM     Problem: RNCM: Need for effective self management of Type 2 Diabetes   Priority: Medium     Long-Range Goal: RNCM:Effective self mangement of Type 2 Diabetes   Start Date: 08/10/2020  Expected End Date: 04/24/2021  This Visit's Progress: On track  Recent Progress: On track  Priority: Medium  Note:   Objective:  Lab Results  Component Value Date   HGBA1C 6.7 (A) 07/21/2020   Lab Results  Component Value Date   CREATININE 1.26 (H) 07/21/2020   CREATININE 1.24 (H) 05/23/2020   CREATININE 1.08 11/19/2019   No results found for: EGFR Current Barriers:  Knowledge  Deficits related to basic Diabetes pathophysiology and self care/management Difficulty obtaining or cannot afford medications Does not adhere to provider recommendations re: ADA/low sodium diet and exercise at times Reports currently getting patient assistance for Farxiga, states she is now picking up at Dr. Bishop Brown office.  States her mood is a lot better since she started the Wellbutrin. States she saw Dr. Leonette Monarch on 01/18/21 and her Hemoglobin A1C was up to 7.2%.  States her blood sugars  have improved since being back on the [place].  CBGs range 114-137 fasting. Denies any hypoglycemia, States she and her husband have been working harder to eat healthier with more vegetables. States she is  walking 2-  times a week weather permitting and doing exercises that PT taught her several times a week to help with her chronic low back pain Case Manager Clinical Goal(s):  patient will demonstrate improved adherence to prescribed treatment plan for diabetes self care/management as evidenced by: daily monitoring and recording of CBG  adherence to ADA/ carb modified diet exercise 3-4 days/week adherence to prescribed medication regimen contacting provider for new or worsened symptoms or questions Interventions:  Collaboration with [name], MD regarding development and update of comprehensive plan of care as evidenced by provider attestation and co-signature Inter-disciplinary care team collaboration (see longitudinal plan of care)  Reinforced education about basic DM disease process Reviewed medications with patient and discussed importance of medication adherence Discussed plans with patient for ongoing care management follow up and provided patient with direct contact information for care management team Reinforced education related to hypo and hyperglycemia and importance of correct treatment Reviewed scheduled/upcoming provider appointments including: CCM PharmD 04/05/21, Dr. Birdie Riddle 04/26/21,  Dr. Kelton Pillar 05/22/21, Dr. Hollie Salk March 2023, Dr. [name] December 2022 Reinforced to check cbg daily and record, calling primary care provider for findings outside established parameters.   Review of patient status, including review of consultants reports, relevant laboratory and other test results, and medications completed. Reviewed fasting blood sugar goals of 80-130 and less than 180 1 1/2-2 hours after meals Reinforced to follow a low carbohydrate low salt diet and to watch portion sizes Patient Goals/Self-Care Activities - Self administers oral medications as prescribed Attends all scheduled provider appointments Checks blood sugars as prescribed and utilize hyper and hypoglycemia protocol as needed Adheres to prescribed ADA/carb modified - check blood sugar at prescribed times - check blood sugar if I feel it is too high or  too low - enter blood sugar readings and medication or insulin into daily log - take the blood sugar log to all doctor visits try walking 3-4 times a week and gradually increase time and number of days  -follow diet and fluid recommendations from kidney doctor Follow Up Plan: Telephone follow up appointment with care management team member scheduled for: 03/15/21 at 10 AM The patient has been provided with contact information for the care management team and has been advised to call with any health related questions or concerns.       Plan:Telephone follow up appointment with care management team member scheduled for:  03/15/21 and The patient has been provided with contact information for the care management team and has been advised to call with any health related questions or concerns.  Peter Garter RN, BSN,CCM, CDE Care Management Coordinator [place] [phone], Mobile [phone]

## 2021-02-08 NOTE — Patient Instructions (Signed)
Visit Information  PATIENT GOALS:  Goals Addressed             This Visit's Progress    RNCM Monitor and Manage My Diabetes Type 2 and chronic kidney disease   On track    Timeframe:  Long-Range Goal Priority:  Medium Start Date:    08/10/20                         Expected End Date:     04/23/21                  Follow Up Date 03/15/21    - check blood sugar at prescribed times - check blood sugar if I feel it is too high or too low - enter blood sugar readings and medication or insulin into daily log - take the blood sugar log to all doctor visits  -try walking 3-4 times a week and gradually increase time and number of days  -follow diet and fluid recommendations from kidney doctor   Why is this important?   Checking your blood sugar at home helps to keep it from getting very high or very low.  Writing the results in a diary or log helps the doctor know how to care for you.  Your blood sugar log should have the time, date and the results.  Also, write down the amount of insulin or other medicine that you take.  Other information, like what you ate, exercise done and how you were feeling, will also be helpful.     Notes:      RNCM:Better track and self manage heart failure symptoms   On track    Timeframe:  Long-Range Goal Priority:  High Start Date:  08/10/20                           Expected End Date: 04/24/21                      Follow Up Date 03/15/21    - begin a heart failure diary - bring diary to all appointments - develop a rescue plan - eat more whole grains, fruits and vegetables, lean meats and healthy fats - follow rescue plan if symptoms flare-up - know when to call the doctor - track symptoms and what helps feel better or worse - dress right for the weather, hot or cold    Why is this important?   You will be able to handle your symptoms better if you keep track of them.  Making some simple changes to your lifestyle will help.  Eating healthy is one  thing you can do to take good care of yourself.           Patient verbalizes understanding of instructions provided today and agrees to view in MyChart.   Telephone follow up appointment with care management team member scheduled for: 03/15/21 10 AM  Dudley Major RN, [facility], CDE Care Management Coordinator [facility] [phone], Mobile [phone]

## 2021-02-15 DIAGNOSIS — M4316 Spondylolisthesis, lumbar region: Secondary | ICD-10-CM | POA: Diagnosis not present

## 2021-02-24 ENCOUNTER — Telehealth: Payer: Self-pay

## 2021-02-24 NOTE — Progress Notes (Signed)
Chronic Care Management Pharmacy Assistant   Name: Kayla Brown  MRN: [id number] DOB: Dec 17, 1950   Reason for Encounter: Disease State - Diabetes Call    Recent office visits:  None noted.   Recent consult visits:  02/03/21 Kayla Brown, DPM - Podiatry- Pain due to onychomycosis of toenails of both feet - Toenails trimmed in office. No medication changes. Follow up in 3 months  01/30/21 Kayla Oaks, NP - Pulmonology - Obstructive Sleep Apnea - New CPAP machine ordered. Use Albuterol Inhaler 2 puffs every 4hrs as needed for wheezing /shortness of breath. Follow up in 6 months.   Hospital visits:  None in previous 6 months  Medications: Outpatient Encounter Medications as of 02/24/2021  Medication Sig Note   albuterol (VENTOLIN HFA) 108 (90 Base) MCG/ACT inhaler Inhale 1-2 puffs into the lungs every 6 (six) hours as needed for wheezing or shortness of breath.    Alcohol Swabs (ALCOHOL PREP) PADS Pt uses an alcohol pad each time sugars are tested. Pt tests twice daily. Dx E11.9    amLODipine (NORVASC) 10 MG tablet TAKE 1 TABLET BY MOUTH EVERY DAY    atorvastatin (LIPITOR) 40 MG tablet TAKE 1 TABLET BY MOUTH EVERY DAY    B Complex Vitamins (B COMPLEX PO) Take 1 tablet by mouth daily.    buPROPion (WELLBUTRIN XL) 150 MG 24 hr tablet Take 1 tablet (150 mg total) by mouth daily.    Calcium Carb-Cholecalciferol [phone] MG-UNIT CAPS Take 1 tablet by mouth 2 (two) times daily.    carvedilol (COREG) 25 MG tablet TAKE 1 TABLET BY MOUTH TWICE A DAY    dapagliflozin propanediol (FARXIGA) 10 MG TABS tablet Take 10 mg by mouth daily. 01/25/2021: Gets through patient assistance.    fish oil-omega-3 fatty acids 1000 MG capsule Take 1 g by mouth daily.    fluticasone (FLONASE) 50 MCG/ACT nasal spray SPRAY 2 SPRAYS INTO EACH NOSTRIL EVERY DAY (Patient taking differently: Place 2 sprays into both nostrils daily.)    furosemide (LASIX) 20 MG tablet TAKE 1 TABLET BY MOUTH EVERY DAY    gabapentin  (NEURONTIN) 100 MG capsule Take 100 mg by mouth 2 (two) times daily.    glipiZIDE (GLUCOTROL) 5 MG tablet Take 1 tablet (5 mg total) by mouth 2 (two) times daily before a meal.    glucose blood (FREESTYLE LITE) test strip USE TO TEST BLOOD GLUCOSE 2 TIMES DAILY. Dx E11.9    loratadine (CLARITIN) 10 MG tablet TAKE 1 TABLET BY MOUTH EVERY DAY (Patient taking differently: Take 10 mg by mouth daily.)    Melatonin 3 MG TABS Take 10 mg by mouth at bedtime.     metFORMIN (GLUCOPHAGE-XR) 500 MG 24 hr tablet Take 1 tablet (500 mg total) by mouth in the morning and at bedtime.    methocarbamol (ROBAXIN) 500 MG tablet Take 500 mg by mouth 2 (two) times daily.    Multiple Vitamins-Minerals (VISION FORMULA) TABS Take 1 tablet by mouth daily.    OVER THE COUNTER MEDICATION Take 1 tablet by mouth daily. Equate Vision Formula 50+    prednisoLONE acetate (PRED FORTE) 1 % ophthalmic suspension SMARTSIG:In Eye(s)    sertraline (ZOLOFT) 50 MG tablet TAKE 1 TABLET BY MOUTH EVERY DAY    traMADol (ULTRAM) 50 MG tablet Take 1 tablet (50 mg total) by mouth every 6 (six) hours as needed.    TRUEPLUS LANCETS 30G MISC Use one lancet each time sugars are tested. Pt tests twice daily. Dx. E11.9  UNABLE TO FIND Med Name: Healthwise talking meter    No facility-administered encounter medications on file as of 02/24/2021.    Current antihyperglycemic regimen:  Glipizide 5 mg twice daily before a meal Metformin XR 500 mg twice daily  Farxiga 10 mg tablet daily (pt is now approved with patient assistance and picks it up from her Nephrologist office when it is shipped to them.)    What recent interventions/DTPs have been made to improve glycemic control:  Patient denied any recent changes to her medication regimen.   Have there been any recent hospitalizations or ED visits since last visit with CPP?  Patient has not had any hospitalizations or ED visits since last visit with CPP.   Patient denies hypoglycemic symptoms,  including Pale, Sweaty, Shaky, Hungry, Nervous/irritable, and Vision changes   Patient denies hyperglycemic symptoms, including blurry vision, excessive thirst, fatigue, polyuria, and weakness   How often are you checking your blood sugar? Patient reported checking daily.   What are your blood sugars ranging?  Fasting: 96-125 Before meals: n/a After meals: n/a Bedtime: n/a   During the week, how often does your blood glucose drop below 70?  Patient denied any low blood sugars below 70.    Are you checking your feet daily/regularly? Patient reported she is checking her feet regularly. She did not express any concerns about her feet currently.    Adherence Review: Is the patient currently on a STATIN medication? Yes Is the patient currently on ACE/ARB medication? No Does the patient have >5 day gap between last estimated fill dates? No  Star Rating Drugs: Metformin (GLUCOPHAGE-XR) 500 MG 24 hr tablet - last filled 12/24/20 90 days  Glipizide (GLUCOTROL) 5 MG tablet - last filled 12/18/20 90 days  Atorvastatin (LIPITOR) 40 MG tablet - last filled 02/19/21 90 days   Future Appointments  Date Time Provider Department Center  03/15/2021 10:00 AM [facility] CARE [facility]  04/05/2021  9:00 AM [facility] PHARMACIST [facility]  04/26/2021  8:30 AM Sheliah Hatch, MD [facility]  05/12/2021  9:45 AM Kayla Brown, DPM [facility]  05/22/2021  8:30 AM Shamleffer, Konrad Dolores, MD [facility] None  05/29/2021  7:30 AM [facility] DX DEXA 1 [facility]  07/04/2021  9:00 AM Kayla Budge, MD [facility] None   Patient scheduled for CPP follow phone call on 05/31/21 @ 11am.  Kayla Brown, CCMA Clinical Pharmacist Assistant  [phone]  Time Spent: 48 minutes

## 2021-03-10 ENCOUNTER — Other Ambulatory Visit: Payer: Self-pay | Admitting: Family Medicine

## 2021-03-15 ENCOUNTER — Ambulatory Visit (INDEPENDENT_AMBULATORY_CARE_PROVIDER_SITE_OTHER): Payer: Medicare Other

## 2021-03-15 ENCOUNTER — Telehealth: Payer: Medicare Other

## 2021-03-15 DIAGNOSIS — N1832 Chronic kidney disease, stage 3b: Secondary | ICD-10-CM

## 2021-03-15 DIAGNOSIS — I10 Essential (primary) hypertension: Secondary | ICD-10-CM

## 2021-03-15 DIAGNOSIS — E785 Hyperlipidemia, unspecified: Secondary | ICD-10-CM

## 2021-03-15 DIAGNOSIS — I5032 Chronic diastolic (congestive) heart failure: Secondary | ICD-10-CM

## 2021-03-15 NOTE — Patient Instructions (Addendum)
Visit Information  PATIENT GOALS:  Goals Addressed             This Visit's Progress    RNCM Monitor and Manage My Diabetes Type 2 and chronic kidney disease   On track    Timeframe:  Long-Range Goal Priority:  Medium Start Date:    08/10/20                         Expected End Date:     04/23/21                  Follow Up Date 04/19/21    - check blood sugar at prescribed times - check blood sugar if I feel it is too high or too low - enter blood sugar readings and medication or insulin into daily log - take the blood sugar log to all doctor visits  -try walking 3-4 times a week and gradually increase time and number of days  -follow diet and fluid recommendations from kidney doctor   Why is this important?   Checking your blood sugar at home helps to keep it from getting very high or very low.  Writing the results in a diary or log helps the doctor know how to care for you.  Your blood sugar log should have the time, date and the results.  Also, write down the amount of insulin or other medicine that you take.  Other information, like what you ate, exercise done and how you were feeling, will also be helpful.     Notes:      RNCM:Better track and self manage heart failure symptoms   On track    Timeframe:  Long-Range Goal Priority:  High Start Date:  08/10/20                           Expected End Date: 04/24/21                      Follow Up Date 04/19/21    - begin a heart failure diary - bring diary to all appointments - develop a rescue plan - eat more whole grains, fruits and vegetables, lean meats and healthy fats - follow rescue plan if symptoms flare-up - know when to call the doctor - track symptoms and what helps feel better or worse - dress right for the weather, hot or cold    Why is this important?   You will be able to handle your symptoms better if you keep track of them.  Making some simple changes to your lifestyle will help.  Eating healthy is  one thing you can do to take good care of yourself.           Patient verbalizes understanding of instructions provided today and agrees to view in MyChart.   Telephone follow up appointment with care management team member scheduled for: 04/19/21 at 3:30 PM  Dudley Major RN, [facility], CDE Care Management Coordinator [facility] [phone], Mobile [phone]

## 2021-03-15 NOTE — Chronic Care Management (AMB) (Signed)
Chronic Care Management   CCM RN Visit Note  03/15/2021 Name: Kayla Brown MRN: [id number] DOB: Nov 11, 1950  Subjective: Kayla Brown is a 70 y.o. year old female who is a primary care patient of Kayla Brown, Kayla Millet, MD. The care management team was consulted for assistance with disease management and care coordination needs.    Engaged with patient by telephone for follow up visit in response to provider referral for case management and/or care coordination services.   Consent to Services:  The patient was given information about Chronic Care Management services, agreed to services, and gave verbal consent prior to initiation of services.  Please see initial visit note for detailed documentation.   Patient agreed to services and verbal consent obtained.   Assessment: Review of patient past medical history, allergies, medications, health status, including review of consultants reports, laboratory and other test data, was performed as part of comprehensive evaluation and provision of chronic care management services.   SDOH (Social Determinants of Health) assessments and interventions performed:    CCM Care Plan  No Known Allergies  Outpatient Encounter Medications as of 03/15/2021  Medication Sig Note   albuterol (VENTOLIN HFA) 108 (90 Base) MCG/ACT inhaler Inhale 1-2 puffs into the lungs every 6 (six) hours as needed for wheezing or shortness of breath.    Alcohol Swabs (ALCOHOL PREP) PADS Pt uses an alcohol pad each time sugars are tested. Pt tests twice daily. Dx E11.9    amLODipine (NORVASC) 10 MG tablet TAKE 1 TABLET BY MOUTH EVERY DAY    atorvastatin (LIPITOR) 40 MG tablet TAKE 1 TABLET BY MOUTH EVERY DAY    B Complex Vitamins (B COMPLEX PO) Take 1 tablet by mouth daily.    buPROPion (WELLBUTRIN XL) 150 MG 24 hr tablet TAKE 1 TABLET BY MOUTH EVERY DAY    Calcium Carb-Cholecalciferol [phone] MG-UNIT CAPS Take 1 tablet by mouth 2 (two) times daily.    carvedilol (COREG) 25 MG  tablet TAKE 1 TABLET BY MOUTH TWICE A DAY    dapagliflozin propanediol (FARXIGA) 10 MG TABS tablet Take 10 mg by mouth daily. 01/25/2021: Gets through patient assistance.    fish oil-omega-3 fatty acids 1000 MG capsule Take 1 g by mouth daily.    fluticasone (FLONASE) 50 MCG/ACT nasal spray SPRAY 2 SPRAYS INTO EACH NOSTRIL EVERY DAY (Patient taking differently: Place 2 sprays into both nostrils daily.)    furosemide (LASIX) 20 MG tablet TAKE 1 TABLET BY MOUTH EVERY DAY    gabapentin (NEURONTIN) 100 MG capsule Take 100 mg by mouth 2 (two) times daily.    glipiZIDE (GLUCOTROL) 5 MG tablet Take 1 tablet (5 mg total) by mouth 2 (two) times daily before a meal.    glucose blood (FREESTYLE LITE) test strip USE TO TEST BLOOD GLUCOSE 2 TIMES DAILY. Dx E11.9    loratadine (CLARITIN) 10 MG tablet TAKE 1 TABLET BY MOUTH EVERY DAY (Patient taking differently: Take 10 mg by mouth daily.)    Melatonin 3 MG TABS Take 10 mg by mouth at bedtime.     metFORMIN (GLUCOPHAGE-XR) 500 MG 24 hr tablet Take 1 tablet (500 mg total) by mouth in the morning and at bedtime.    methocarbamol (ROBAXIN) 500 MG tablet Take 500 mg by mouth 2 (two) times daily.    Multiple Vitamins-Minerals (VISION FORMULA) TABS Take 1 tablet by mouth daily.    OVER THE COUNTER MEDICATION Take 1 tablet by mouth daily. Equate Vision Formula 50+    prednisoLONE acetate (PRED FORTE)  1 % ophthalmic suspension SMARTSIG:In Eye(s)    sertraline (ZOLOFT) 50 MG tablet TAKE 1 TABLET BY MOUTH EVERY DAY    traMADol (ULTRAM) 50 MG tablet Take 1 tablet (50 mg total) by mouth every 6 (six) hours as needed.    TRUEPLUS LANCETS 30G MISC Use one lancet each time sugars are tested. Pt tests twice daily. Dx. E11.9    UNABLE TO FIND Med Name: Healthwise talking meter    No facility-administered encounter medications on file as of 03/15/2021.    Patient Active Problem List   Diagnosis Date Noted   Reactive airway disease 01/30/2021   Morbid obesity ([name]) 11/18/2020    Osteopenia 11/18/2020   Type 2 diabetes mellitus with stage 3a chronic kidney disease, without long-term current use of insulin ([name]) 07/21/2020   Type 2 diabetes mellitus with hyperglycemia, with long-term current use of insulin ([name]) 04/19/2020   Lumbar radiculopathy 03/02/2020   Left hip pain 12/01/2019   Type 2 diabetes mellitus with stage 3b chronic kidney disease, without long-term current use of insulin ([name]) 02/05/4817   Diastolic CHF ([name]) 56/31/4970   Dyspnea 03/08/2019   Pulmonary edema 03/04/2019   Physical exam 02/06/2017   Anxiety and depression 12/05/2016   Obesity (BMI 30-39.9) 03/01/2016   OSA (obstructive sleep apnea) 07/11/2015   Pulmonary hypertension ([name]) 05/06/2015   Heart murmur 03/03/2015   Essential hypertension, benign 03/18/2014   Hyperlipidemia 03/18/2014   Post-menopause on HRT (hormone replacement therapy) 03/18/2014    Conditions to be addressed/monitored:CHF, HTN, HLD, DMII, and CKD Stage 3  Care Plan : RNCM:Chronic Kidney (Adult)  Updates made by Kayla Ped, RN since 03/15/2021 12:00 AM     Problem: RNCM: Need for long term self management plan for chronic kidney disease   Priority: Medium     Long-Range Goal: RNCM : Disease Progression Prevented or Minimized   Start Date: 08/10/2020  Expected End Date: 04/24/2021  This Visit's Progress: On track  Recent Progress: On track  Priority: Medium  Note:   Current Barriers:  Knowledge Deficits related to Chronic kidney disease stage 3  now being followed by nephrology  Does not adhere to provider recommendations re: diet and fluids recommendations at times-reports avoiding dark sodas and drinking water instead States she will see KaylaUpton again in 8 months. reports B/P readings 120130/60-80.  States she is picking up her [place]ade at Kayla Brown office with out any issues Nurse Case Manager Clinical Goal(s):  patient will verbalize understanding of plan for self management of chronic kidney  disease patient will work with CCM team and primary care provider to address needs related to chronic kidney disease patient will attend all scheduled medical appointments: CCM PharmD 04/05/21,Dr. Birdie Brown 04/26/21, Dr. Kelton Pillar 05/22/21  KaylaUpton March 2023 patient will verbalize basic understanding of chronic kidney disease process and self health management plan as evidenced by no worsening of kidney function, adherence to diet and fluid recommendations  patient will work with CM team pharmacist to self manage medications the patient will demonstrate ongoing self health care management ability as evidenced by adherence to plan of care, medications, diet and provider appts, call providers with problems or concerns*  Interventions:  1:1 collaboration with [name], MD regarding development and update of comprehensive plan of care as evidenced by provider attestation and co-signature Inter-disciplinary care team collaboration (see longitudinal plan of care) Evaluation of current treatment plan related to chronic kidney disease and patient's adherence to plan as established by provider. Reinforced education to patient  re: chronic kidney disease self management Reinforced importance of keeping blood sugar and B/P under control to help prevent further kidney damage Reviewed medications with patient and discussed to call CCM team for any problems or concerns Reviewed scheduled/upcoming provider appointments including: CCM Pharm D 04/05/21, Dr. Birdie Brown 04/26/21, Dr. Kelton Pillar 05/22/21, [facility] March 2023,  Discussed plans with patient for ongoing care management follow up and provided patient with direct contact information for care management team Patient Goals/Self-Care Activities  - Patient will self administer medications as prescribed Patient will attend all scheduled provider appointments Patient will call pharmacy for medication refills Patient will call provider office for  new concerns or questions -try walking 3-4 times a week and gradually increase time and number of days  -follow diet and fluid recommendations from kidney doctor Follow Up Plan: Telephone follow up appointment with care management team member scheduled for: 04/19/21 at 3:30 PM The patient has been provided with contact information for the care management team and has been advised to call with any health related questions or concerns.          Care Plan : EAVW:UJWJX Failure (Adult)  Updates made by Kayla Ped, RN since 03/15/2021 12:00 AM     Problem: RNCM: Knowledge deficit related to long term self management of heart failure   Priority: High     Long-Range Goal: Effective self mangement of heart failure   Start Date: 08/10/2020  Expected End Date: 04/24/2021  This Visit's Progress: On track  Recent Progress: On track  Priority: High  Note:   Current Barriers:  Does not adhere to provider recommendations re: diet and fluid recommendations at times Reports weighing daily with weight stable at 175 lb this morning. States her weights range from 172-176. Reports she has not needed to take extra Lasix since the end of May.  Denies any swelling, shortness of breath or chest pains, Reports trying to follow a low sodium diet.  States she now has a new CPAP machine and it is working good. Case Manager Clinical Goal(s):  patient will weigh self daily and record patient will verbalize understanding of Heart Failure Action Plan and when to call doctor patient will take all Heart Failure mediations as prescribed patient will weigh daily and record (notifying MD of 3 lb weight gain over night or 5 lb in a week) Interventions:  Collaboration with [name], MD regarding development and update of comprehensive plan of care as evidenced by provider attestation and co-signature Inter-disciplinary care team collaboration (see longitudinal plan of care) Reviewed upcoming appointment  with cardiology 06/23/21 Basic overview and discussion of pathophysiology of Heart Failure reviewed  Reinforced verbal education on low sodium diet Reinforced Heart Failure Action Plan   Reinforced placing scale on hard, flat surface Discussed importance of daily weight and advised patient to weigh and record daily Reinforced role of diuretics in prevention of fluid overload and management of heart failure Reinforced importance of using CPAP for sleep apnea to help with her heart and B/P Patient Goals/Self-Care Activities - begin a heart failure diary - bring diary to all appointments - develop a rescue plan - eat more whole grains, fruits and vegetables, lean meats and healthy fats - follow rescue plan if symptoms flare-up - know when to call the doctor - track symptoms and what helps feel better or worse Follow Up Plan: Telephone follow up appointment with care management team member scheduled for: 04/19/21 at 3:30 PM The patient has been provided with  contact information for the care management team and has been advised to call with any health related questions or concerns.      Care Plan : RNCM:Diabetes Type 2 (Adult)  Updates made by Kayla Ped, RN since 03/15/2021 12:00 AM     Problem: RNCM: Need for effective self management of Type 2 Diabetes   Priority: Medium     Long-Range Goal: RNCM:Effective self mangement of Type 2 Diabetes   Start Date: 08/10/2020  Expected End Date: 04/24/2021  This Visit's Progress: On track  Recent Progress: On track  Priority: Medium  Note:   Objective:  Lab Results  Component Value Date   HGBA1C 6.7 (A) 07/21/2020   Lab Results  Component Value Date   CREATININE 1.26 (H) 07/21/2020   CREATININE 1.24 (H) 05/23/2020   CREATININE 1.08 11/19/2019   No results found for: EGFR Current Barriers:  Knowledge Deficits related to basic Diabetes pathophysiology and self care/management Difficulty obtaining or cannot afford medications  Does not adhere to provider recommendations re: ADA/low sodium diet and exercise at times Reports currently getting patient assistance for Farxiga, states she is now picking up at Kayla Brown office.  States her   CBGs range 95-125 fasting and around 130 later in the day. Denies any hypoglycemia, States she and her husband have been working harder to eat healthier with more vegetables. States she is  walking 2-  times a week weather permitting and doing exercises that PT taught her several times a week to help with her chronic low back pain.  States her back pain has been much better since she had an injection in her back Case Manager Clinical Goal(s):  patient will demonstrate improved adherence to prescribed treatment plan for diabetes self care/management as evidenced by: daily monitoring and recording of CBG  adherence to ADA/ carb modified diet exercise 3-4 days/week adherence to prescribed medication regimen contacting provider for new or worsened symptoms or questions Interventions:  Collaboration with [name], MD regarding development and update of comprehensive plan of care as evidenced by provider attestation and co-signature Inter-disciplinary care team collaboration (see longitudinal plan of care)  Reinforced education about basic DM disease process Reviewed medications with patient and discussed importance of medication adherence Discussed plans with patient for ongoing care management follow up and provided patient with direct contact information for care management team Reinforced s/sx of hypoglycemia and hyperglycemia and importance of correct treatment Reviewed scheduled/upcoming provider appointments including: CCM PharmD 04/05/21, Dr. Birdie Brown 04/26/21, Dr. Kelton Pillar 05/22/21, Dr. Hollie Salk March 2023, cardiology 06/23/21 Reinforced to check cbg daily and record, calling primary care provider for findings outside established parameters.   Review of patient status, including  review of consultants reports, relevant laboratory and other test results, and medications completed. Reinforced fasting blood sugar goals of 80-130 and less than 180 1 1/2-2 hours after meals Reinforced to follow a low carbohydrate low salt diet and to watch portion sizes Reviewed to increase activity as tolerated Patient Goals/Self-Care Activities - Self administers oral medications as prescribed Attends all scheduled provider appointments Checks blood sugars as prescribed and utilize hyper and hypoglycemia protocol as needed Adheres to prescribed ADA/carb modified - check blood sugar at prescribed times - check blood sugar if I feel it is too high or too low - enter blood sugar readings and medication or insulin into daily log - take the blood sugar log to all doctor visits try walking 3-4 times a week and gradually increase time and number of days  -  follow diet and fluid recommendations from kidney doctor Follow Up Plan: Telephone follow up appointment with care management team member scheduled for: 04/19/21 at 3:30 PM The patient has been provided with contact information for the care management team and has been advised to call with any health related questions or concerns.       Plan:Telephone follow up appointment with care management team member scheduled for:  04/19/21 and The patient has been provided with contact information for the care management team and has been advised to call with any health related questions or concerns.  Peter Garter RN, BSN,CCM, CDE Care Management Coordinator [place] [phone], Mobile [phone]

## 2021-03-24 DIAGNOSIS — E785 Hyperlipidemia, unspecified: Secondary | ICD-10-CM

## 2021-03-24 DIAGNOSIS — I10 Essential (primary) hypertension: Secondary | ICD-10-CM | POA: Diagnosis not present

## 2021-03-24 DIAGNOSIS — N1832 Chronic kidney disease, stage 3b: Secondary | ICD-10-CM | POA: Diagnosis not present

## 2021-03-24 DIAGNOSIS — E1122 Type 2 diabetes mellitus with diabetic chronic kidney disease: Secondary | ICD-10-CM | POA: Diagnosis not present

## 2021-03-24 DIAGNOSIS — I5032 Chronic diastolic (congestive) heart failure: Secondary | ICD-10-CM

## 2021-04-04 ENCOUNTER — Other Ambulatory Visit: Payer: Self-pay | Admitting: Family Medicine

## 2021-04-04 NOTE — Telephone Encounter (Signed)
Request for 90 day rx 

## 2021-04-05 ENCOUNTER — Ambulatory Visit (INDEPENDENT_AMBULATORY_CARE_PROVIDER_SITE_OTHER): Payer: Medicare Other

## 2021-04-05 DIAGNOSIS — I5032 Chronic diastolic (congestive) heart failure: Secondary | ICD-10-CM

## 2021-04-05 DIAGNOSIS — F419 Anxiety disorder, unspecified: Secondary | ICD-10-CM

## 2021-04-05 DIAGNOSIS — E1122 Type 2 diabetes mellitus with diabetic chronic kidney disease: Secondary | ICD-10-CM

## 2021-04-05 DIAGNOSIS — N1831 Chronic kidney disease, stage 3a: Secondary | ICD-10-CM

## 2021-04-05 DIAGNOSIS — F32A Depression, unspecified: Secondary | ICD-10-CM

## 2021-04-05 NOTE — Patient Instructions (Signed)
Kayla Brown,  Thank you for talking with me today. I have included our care plan/goals in the following pages.   Please review and call me at [phone] with any questions.  Thanks! Kayla Brown, PharmD, CPP Clinical Pharmacist Practitioner  [phone]  Care Plan : CCM Pharmacy Care Plan  Updates made by Dahlia Byes, [facility] since 04/05/2021 12:00 AM     Problem: Hypertension, Hyperlipidemia, Diabetes, Heart Failure and Chronic Kidney Disease   Priority: High     Long-Range Goal: Disease Management   Start Date: 08/25/2020  Expected End Date: 08/25/2021  Recent Progress: On track  Priority: High  Note:   Current Barriers:  Unable to independently afford treatment regimen Unable tomaintain control of DM  Pharmacist Clinical Goal(s):  Patient will contact provider office for questions/concerns as evidenced notation of same in electronic health record through collaboration with PharmD and provider.   Interventions: 1:1 collaboration with Sheliah Hatch, MD regarding development and update of comprehensive plan of care as evidenced by provider attestation and co-signature Inter-disciplinary care team collaboration (see longitudinal plan of care) Comprehensive medication review performed; medication list updated in electronic medical record  CHF: (goal: minimize symptoms, ensure appropriate medication use) -Controlled -Continues with daily weights. Denies frequent weight gain >3lb, has not taken additional dose past 1-2 months. -Current treatment: Furosemide 20 mg once daily Carvedilol 25 mg twice daily  -October: 174.2, 175.0, 174.2, 175.2  -Educated on appropriate use of furosemide -Counseled on diet and exercise extensively Recommended to continue current medication  Depression (Goal: minimize symptoms and medication related side effects) -Controlled -Again reports a very positive response to bupropion in terms of mood and attentiveness, feels that  anxiety continues to be a challenge so may want to explore additional options in the future here. -PHQ 3 (April 05 2021) -GAD7 10 (April 05 2021) -Current treatment: Sertraline 50 mg once daily Bupropion 150 mg XL once daily  -PHQ9: 0 (August 2022) -Educated on Benefits of medication for symptom control  - Recommended to continue current medication Could consider sertraline increase to 100 mg or addition of buspar for ongoing anxiety  BH declined at this time   Diabetes (A1c goal <7%) -Not ideally controlled -Now getting Farxiga Rx through PAP sent to Dr Beaulah Corin office.  -Temporary increase in FBGs following birthday weekend celebration.  -Current medications: Farxiga 10 mg once daily  Glipizide 5 mg twice daily before a meal Metformin XR 500 mg twice daily  -Medications previously tried:   -Current home glucose readings:  October 1st-12th 2022 - 138, 178 (after birthday), 165, 138, 137, 140, 114, 118, 129, 117 August 2022 - 100s FBGs. Some PPD >200s attributed to snacking June 2022 - 141, 179, 211, 143 Denies hypoglycemic/hyperglycemic symptoms -Current exercise: October - walking 2-3x per week 10-20 minutes at a time. Goal is to start walking more -Educated on A1c and blood sugar goals; Benefits of routine self-monitoring of blood sugar; -Counseled on diet and exercise extensively Recommended to continue current medication Reminder set for November 2022 to reach out to Dr Signe Colt to ensure new eRx sent in for following year on farxiga - pharmacy is [facility] (or [facility] [address] [place], [place] [address].      The patient verbalized understanding of instructions provided today and agreed to receive a MyChart copy of patient instruction and/or educational materials. Telephone follow up appointment with pharmacy team member scheduled for: See next appointment with "Care Management Staff" under "What's  Next" below.  Diabetes Mellitus and Nutrition,  Adult When you have diabetes, or diabetes mellitus, it is very important to have healthy eating habits because your blood sugar (glucose) levels are greatly affected by what you eat and drink. Eating healthy foods in the right amounts, at about the same times every day, can help you: Control your blood glucose. Lower your risk of heart disease. Improve your blood pressure. Reach or maintain a healthy weight. What can affect my meal plan? Every person with diabetes is different, and each person has different needs for a meal plan. Your health care provider may recommend that you work with a dietitian to make a meal plan that is best for you. Your meal plan may vary depending on factors such as: The calories you need. The medicines you take. Your weight. Your blood glucose, blood pressure, and cholesterol levels. Your activity level. Other health conditions you have, such as heart or kidney disease. How do carbohydrates affect me? Carbohydrates, also called carbs, affect your blood glucose level more than any other type of food. Eating carbs naturally raises the amount of glucose in your blood. Carb counting is a method for keeping track of how many carbs you eat. Counting carbs is important to keep your blood glucose at a healthy level, especially if you use insulin or take certain oral diabetes medicines. It is important to know how many carbs you can safely have in each meal. This is different for every person. Your dietitian can help you calculate how many carbs you should have at each meal and for each snack. How does alcohol affect me? Alcohol can cause a sudden decrease in blood glucose (hypoglycemia), especially if you use insulin or take certain oral diabetes medicines. Hypoglycemia can be a life-threatening condition. Symptoms of hypoglycemia, such as sleepiness, dizziness, and confusion, are similar to symptoms of having too much alcohol. Do not drink alcohol if: Your health care  provider tells you not to drink. You are pregnant, may be pregnant, or are planning to become pregnant. If you drink alcohol: Do not drink on an empty stomach. Limit how much you use to: 0-1 drink a day for women. 0-2 drinks a day for men. Be aware of how much alcohol is in your drink. In the [place] one drink equals one 12 oz bottle of beer (355 mL), one 5 oz glass of wine (148 mL), or one 1 oz glass of hard liquor (44 mL). Keep yourself hydrated with water, diet soda, or unsweetened iced tea. Keep in mind that regular soda, juice, and other mixers may contain a lot of sugar and must be counted as carbs. What are tips for following this plan? Reading food labels Start by checking the serving size on the "Nutrition Facts" label of packaged foods and drinks. The amount of calories, carbs, fats, and other nutrients listed on the label is based on one serving of the item. Many items contain more than one serving per package. Check the total grams (g) of carbs in one serving. You can calculate the number of servings of carbs in one serving by dividing the total carbs by 15. For example, if a food has 30 g of total carbs per serving, it would be equal to 2 servings of carbs. Check the number of grams (g) of saturated fats and trans fats in one serving. Choose foods that have a low amount or none of these fats. Check the number of milligrams (mg) of salt (sodium) in one serving.  Most people should limit total sodium intake to less than 2,300 mg per day. Always check the nutrition information of foods labeled as "low-fat" or "nonfat." These foods may be higher in added sugar or refined carbs and should be avoided. Talk to your dietitian to identify your daily goals for nutrients listed on the label. Shopping Avoid buying canned, pre-made, or processed foods. These foods tend to be high in fat, sodium, and added sugar. Shop around the outside edge of the grocery store. This is where you will most often  find fresh fruits and vegetables, bulk grains, fresh meats, and fresh dairy. Cooking Use low-heat cooking methods, such as baking, instead of high-heat cooking methods like deep frying. Cook using healthy oils, such as olive, canola, or sunflower oil. Avoid cooking with butter, cream, or high-fat meats. Meal planning Eat meals and snacks regularly, preferably at the same times every day. Avoid going long periods of time without eating. Eat foods that are high in fiber, such as fresh fruits, vegetables, beans, and whole grains. Talk with your dietitian about how many servings of carbs you can eat at each meal. Eat 4-6 oz (112-168 g) of lean protein each day, such as lean meat, chicken, fish, eggs, or tofu. One ounce (oz) of lean protein is equal to: 1 oz (28 g) of meat, chicken, or fish. 1 egg.  cup (62 g) of tofu. Eat some foods each day that contain healthy fats, such as avocado, nuts, seeds, and fish. What foods should I eat? Fruits Berries. Apples. Oranges. Peaches. Apricots. Plums. Grapes. Mango. Papaya. Pomegranate. Kiwi. Cherries. Vegetables Lettuce. Spinach. Leafy greens, including kale, chard, collard greens, and mustard greens. Beets. Cauliflower. Cabbage. Broccoli. Carrots. Green beans. Tomatoes. Peppers. Onions. Cucumbers. Brussels sprouts. Grains Whole grains, such as whole-wheat or whole-grain bread, crackers, tortillas, cereal, and pasta. Unsweetened oatmeal. Quinoa. Brown or wild rice. Meats and other proteins Seafood. Poultry without skin. Lean cuts of poultry and beef. Tofu. Nuts. Seeds. Dairy Low-fat or fat-free dairy products such as milk, yogurt, and cheese. The items listed above may not be a complete list of foods and beverages you can eat. Contact a dietitian for more information. What foods should I avoid? Fruits Fruits canned with syrup. Vegetables Canned vegetables. Frozen vegetables with butter or cream sauce. Grains Refined white flour and flour products  such as bread, pasta, snack foods, and cereals. Avoid all processed foods. Meats and other proteins Fatty cuts of meat. Poultry with skin. Breaded or fried meats. Processed meat. Avoid saturated fats. Dairy Full-fat yogurt, cheese, or milk. Beverages Sweetened drinks, such as soda or iced tea. The items listed above may not be a complete list of foods and beverages you should avoid. Contact a dietitian for more information. Questions to ask a health care provider Do I need to meet with a diabetes educator? Do I need to meet with a dietitian? What number can I call if I have questions? When are the best times to check my blood glucose? Where to find more information: American Diabetes Association: diabetes.org Academy of Nutrition and Dietetics: www.eatright.[employer] of Diabetes and Digestive and Kidney Diseases: CarFlippers.tn Association of Diabetes Care and Education Specialists: www.diabeteseducator.org Summary It is important to have healthy eating habits because your blood sugar (glucose) levels are greatly affected by what you eat and drink. A healthy meal plan will help you control your blood glucose and maintain a healthy lifestyle. Your health care provider may recommend that you work with a dietitian to make  a meal plan that is best for you. Keep in mind that carbohydrates (carbs) and alcohol have immediate effects on your blood glucose levels. It is important to count carbs and to use alcohol carefully. This information is not intended to replace advice given to you by your health care provider. Make sure you discuss any questions you have with your health care provider. Document Revised: 05/19/2019 Document Reviewed: 05/19/2019 Elsevier Patient Education  2021 [employer].

## 2021-04-05 NOTE — Progress Notes (Signed)
Chronic Care Management Pharmacy Note  04/05/2021 Name:  Kayla Brown MRN:  [id number] DOB:  [birth year]  Subjective: Kayla Brown is an 70 y.o. year old female who is a primary patient of [name], [name], MD.  The CCM team was consulted for assistance with disease management and care coordination needs.    Engaged with patient by telephone for follow up visit in response to provider referral for pharmacy case management and/or care coordination services.   Consent to Services:  The patient was given information about Chronic Care Management services, agreed to services, and gave verbal consent prior to initiation of services.  Please see initial visit note for detailed documentation.   Patient Care Team: [name], MD as PCP - General (Family Medicine) [name], MD (Inactive) as Consulting Physician (Gastroenterology) [name], MD as Consulting Physician (Gynecology) [name], [facility] as Pharmacist (Pharmacist) [name], RN as Case Manager  Objective:  Lab Results  Component Value Date   CREATININE 1.06 (H) 11/18/2020   CREATININE 1.26 (H) 07/21/2020   CREATININE 1.24 (H) 05/23/2020    Lab Results  Component Value Date   HGBA1C 7.2 (A) 01/18/2021   Last diabetic Eye exam:  Lab Results  Component Value Date/Time   HMDIABEYEEXA No Retinopathy 08/01/2020 03:45 PM    Last diabetic Foot exam:  Lab Results  Component Value Date/Time   HMDIABFOOTEX completed 01/24/2016 12:00 AM        Component Value Date/Time   CHOL 126 11/18/2020 1436   TRIG 141 11/18/2020 1436   HDL 52 11/18/2020 1436   CHOLHDL 2.4 11/18/2020 1436   VLDL 46.4 (H) 05/23/2020 1431   LDLCALC 52 11/18/2020 1436   LDLDIRECT 50.0 05/23/2020 1431    Hepatic Function Latest Ref Rng & Units 11/18/2020 05/23/2020 11/19/2019  Total Protein 6.1 - 8.1 g/dL 6.4 6.6 6.9  Albumin 3.5 - 5.2 g/dL - 4.3 4.5  AST 10 - 35 U/L 15 18 13   ALT 6 - 29 U/L 20 29 16   Alk  Phosphatase 39 - 117 U/L - 61 64  Total Bilirubin 0.2 - 1.2 mg/dL 0.3 0.3 0.3  Bilirubin, Direct 0.0 - 0.2 mg/dL 0.1 0.1 0.1    Lab Results  Component Value Date/Time   TSH 1.19 11/18/2020 02:36 PM   TSH 1.32 05/23/2020 02:31 PM    CBC Latest Ref Rng & Units 11/18/2020 05/23/2020 11/19/2019  WBC 3.8 - 10.8 Thousand/uL 10.5 10.0 10.4  Hemoglobin 11.7 - 15.5 g/dL 11.7 13.0 12.7  Hematocrit 35.0 - 45.0 % 35.7 39.4 37.5  Platelets 140 - 400 Thousand/uL 268 287.0 294.0    No results found for: VD25OH  Clinical ASCVD:  The ASCVD Risk score (Arnett DK, et al., 2019) failed to calculate for the following reasons:   The valid total cholesterol range is 130 to 320 mg/dL   Social History   Tobacco Use  Smoking Status Never  Smokeless Tobacco Never   BP Readings from Last 3 Encounters:  01/30/21 118/74  01/18/21 132/80  12/28/20 120/70   Pulse Readings from Last 3 Encounters:  01/30/21 (!) 55  01/18/21 (!) 58  12/28/20 (!) 57   Wt Readings from Last 3 Encounters:  01/30/21 175 lb (79.4 kg)  01/18/21 178 lb 12.8 oz (81.1 kg)  12/28/20 178 lb 12.8 oz (81.1 kg)    Assessment: Review of patient past medical history, allergies, medications, health status, including review of consultants reports, laboratory and other test data, was performed as  part of comprehensive evaluation and provision of chronic care management services.   SDOH:  (Social Determinants of Health) assessments and interventions performed: Yes   CCM Care Plan  No Known Allergies  Medications Reviewed Today     Reviewed by [name], [facility] (Pharmacist) on 04/05/21 at [phone]  Med List Status: <None>   Medication Order Taking? Sig Documenting Provider Last Dose Status Informant  albuterol (VENTOLIN HFA) 108 (90 Base) MCG/ACT inhaler [id number]  Inhale 1-2 puffs into the lungs every 6 (six) hours as needed for wheezing or shortness of breath. [provider]  Active   Alcohol Swabs (ALCOHOL PREP) PADS  [id number]  Pt uses an alcohol pad each time sugars are tested. Pt tests twice daily. Dx E11.9 [name], MD  Active Spouse/Significant Other  amLODipine (NORVASC) 10 MG tablet [id number] Yes TAKE 1 TABLET BY MOUTH EVERY DAY [name], MD Taking Active   atorvastatin (LIPITOR) 40 MG tablet [id number] Yes TAKE 1 TABLET BY MOUTH EVERY DAY [name], MD Taking Active   B Complex Vitamins (B COMPLEX PO) [id number] Yes Take 1 tablet by mouth daily. [provider] Taking Active   buPROPion (WELLBUTRIN XL) 150 MG 24 hr tablet [id number] Yes TAKE 1 TABLET BY MOUTH EVERY DAY [name], MD Taking Active   Calcium Carb-Cholecalciferol [phone] MG-UNIT CAPS [id number] Yes Take 1 tablet by mouth 2 (two) times daily. [provider] Taking Active   carvedilol (COREG) 25 MG tablet [id number] Yes TAKE 1 TABLET BY MOUTH TWICE A DAY [name], [name], MD Taking Active   dapagliflozin propanediol (FARXIGA) 10 MG TABS tablet [id number] Yes Take 10 mg by mouth daily. [provider] Taking Active Self           Med Note Leroy Sea, Sylvie Mifsud   Wed Jan 25, 2021  9:18 AM) Gets through patient assistance.   fish oil-omega-3 fatty acids 1000 MG capsule [id number]  Take 1 g by mouth daily. [provider]  Active Spouse/Significant Other  fluticasone (FLONASE) 50 MCG/ACT nasal spray [id number]  SPRAY 2 SPRAYS INTO EACH NOSTRIL EVERY DAY  Patient taking differently: Place 2 sprays into both nostrils daily.   [name], MD  Active   furosemide (LASIX) 20 MG tablet [id number] Yes TAKE 1 TABLET BY MOUTH EVERY DAY [name], MD Taking Active   gabapentin (NEURONTIN) 100 MG capsule [id number] Yes Take 100 mg by mouth 2 (two) times daily. [provider] Taking Active   glipiZIDE (GLUCOTROL) 5 MG tablet [id number] Yes Take 1 tablet (5 mg total) by mouth 2 (two) times daily before a meal. Shamleffer, Melanie Crazier, MD Taking Active   glucose blood  (FREESTYLE LITE) test strip [id number]  USE TO TEST BLOOD GLUCOSE 2 TIMES DAILY. Dx E11.9 [name], MD  Active Spouse/Significant Other  loratadine (CLARITIN) 10 MG tablet [id number] Yes TAKE 1 TABLET BY MOUTH EVERY DAY  Patient taking differently: Take 10 mg by mouth daily.   [name], MD Taking Active   Melatonin 3 MG TABS [id number] Yes Take 10 mg by mouth at bedtime.  [provider] Taking Active Spouse/Significant Other  metFORMIN (GLUCOPHAGE-XR) 500 MG 24 hr tablet [id number] Yes Take 1 tablet (500 mg total) by mouth in the morning and at bedtime. Shamleffer, Melanie Crazier, MD Taking Active   methocarbamol (ROBAXIN) 500 MG tablet [id number]  Take 500 mg by mouth 2 (two) times daily. [provider]  Active   Multiple Vitamins-Minerals (VISION FORMULA)  TABS [id number]  Take 1 tablet by mouth daily. [provider]  Active Spouse/Significant Other  OVER THE COUNTER MEDICATION [id number]  Take 1 tablet by mouth daily. Equate Vision Formula 50+ [provider]  Active Spouse/Significant Other  prednisoLONE acetate (PRED FORTE) 1 % ophthalmic suspension [id number]  SMARTSIG:In Eye(s) [provider]  Active   sertraline (ZOLOFT) 50 MG tablet [id number] Yes TAKE 1 TABLET BY MOUTH EVERY DAY [name], MD Taking Active   traMADol (ULTRAM) 50 MG tablet [id number]  Take 1 tablet (50 mg total) by mouth every 6 (six) hours as needed. Aundra Dubin, PA-C  Active   TRUEPLUS LANCETS 30G [place] [id number]  Use one lancet each time sugars are tested. Pt tests twice daily. Dx. E11.9 [name], MD  Active Spouse/Significant Other  UNABLE TO FIND [id number]  Med Name: [place] talking meter Shamleffer, Melanie Crazier, MD  Active             Patient Active Problem List   Diagnosis Date Noted   Reactive airway disease 01/30/2021   Morbid obesity ([place]) 11/18/2020   Osteopenia 11/18/2020   Type 2 diabetes mellitus with stage 3a  chronic kidney disease, without long-term current use of insulin ([place]) 07/21/2020   Type 2 diabetes mellitus with hyperglycemia, with long-term current use of insulin ([place]) 04/19/2020   Lumbar radiculopathy 03/02/2020   Left hip pain 12/01/2019   Type 2 diabetes mellitus with stage 3b chronic kidney disease, without long-term current use of insulin ([place]) 09/98/3382   Diastolic CHF ([place]) 50/53/9767   Dyspnea 03/08/2019   Pulmonary edema 03/04/2019   Physical exam 02/06/2017   Anxiety and depression 12/05/2016   Obesity (BMI 30-39.9) 03/01/2016   OSA (obstructive sleep apnea) 07/11/2015   Pulmonary hypertension ([place]) 05/06/2015   Heart murmur 03/03/2015   Essential hypertension, benign 03/18/2014   Hyperlipidemia 03/18/2014   Post-menopause on HRT (hormone replacement therapy) 03/18/2014    Immunization History  Administered Date(s) Administered   Fluad Quad(high Dose 65+) 02/16/2019, 03/28/2020   Influenza,inj,Quad PF,6+ Mos 04/24/2016, 03/04/2017, 03/20/2018   PFIZER(Purple Top)SARS-COV-2 Vaccination 09/24/2019, 10/15/2019, 04/25/2020   Pneumococcal Conjugate-13 07/06/2016   Pneumococcal Polysaccharide-23 07/01/2017   Tdap 09/23/2009    Conditions to be addressed/monitored: CHF, HTN, HLD, Hypertriglyceridemia, DMII, CKD Stage 3-4, Anxiety, and Depression   Care Plan : [place]  Updates made by [name], [place] since 04/05/2021 12:00 AM     Problem: Hypertension, Hyperlipidemia, Diabetes, Heart Failure and Chronic Kidney Disease   Priority: High     Long-Range Goal: Disease Management   Start Date: 08/25/2020  Expected End Date: 08/25/2021  Recent Progress: On track  Priority: High  Note:   Current Barriers:  Unable to independently afford treatment regimen Unable tomaintain control of DM  Pharmacist Clinical Goal(s):  Patient will contact provider office for questions/concerns as evidenced notation of same in electronic health record through  collaboration with PharmD and provider.   Interventions: 1:1 collaboration with [name], MD regarding development and update of comprehensive plan of care as evidenced by provider attestation and co-signature Inter-disciplinary care team collaboration (see longitudinal plan of care) Comprehensive medication review performed; medication list updated in electronic medical record  CHF: (goal: minimize symptoms, ensure appropriate medication use) -Controlled -Continues with daily weights. Denies frequent weight gain >3lb, has not taken additional dose past 1-2 months. -Current treatment: Furosemide 20 mg once daily Carvedilol 25 mg twice daily  -October: 174.2, 175.0, 174.2, 175.2  -Educated on appropriate  use of furosemide -Counseled on diet and exercise extensively Recommended to continue current medication  Depression (Goal: minimize symptoms and medication related side effects) -Controlled -Again reports a very positive response to bupropion in terms of mood and attentiveness, feels that anxiety continues to be a challenge so may want to explore additional options in the future here. -PHQ 3 (April 05 2021) -GAD7 10 (April 05 2021) -Current treatment: Sertraline 50 mg once daily Bupropion 150 mg XL once daily  -PHQ9: 0 (August 2022) -Educated on Benefits of medication for symptom control  - Recommended to continue current medication Could consider sertraline increase to 100 mg or addition of buspar for ongoing anxiety  BH declined at this time   Diabetes (A1c goal <7%) -Not ideally controlled -Now getting Farxiga Rx through PAP sent to Dr Bishop [place] office.  -Temporary increase in FBGs following birthday weekend celebration.  -Current medications: Farxiga 10 mg once daily  Glipizide 5 mg twice daily before a meal Metformin XR 500 mg twice daily  -Medications previously tried:   -Current home glucose readings:  October 1st-12th 2022 - 138, 178 (after birthday),  165, 138, 137, 140, 114, 118, 129, 117 August 2022 - 100s FBGs. Some PPD >200s attributed to snacking June 2022 - 141, 179, 211, 143 Denies hypoglycemic/hyperglycemic symptoms -Current exercise: October - walking 2-3x per week 10-20 minutes at a time. Goal is to start walking more -Educated on A1c and blood sugar goals; Benefits of routine self-monitoring of blood sugar; -Counseled on diet and exercise extensively Recommended to continue current medication Reminder set for November 2022 to reach out to Dr Hollie Salk to ensure new eRx sent in for following year on [place] is [employer] ([place] [address].       Medication Assistance:  [place] obtained through [employer] medication assistance program.  Enrollment ends 05/2021  Patient's preferred pharmacy is:  [id number] [place] [id number]: [phone]: [phone] Follow Up:  Patient agrees to Care Plan and Follow-up.  Plan:  2 month RCalpellaf/u visit scheduled  Future Appointments  Date Time Provider [place] 04/19/2021  3:30 [facility]  04/26/2021  8:30 AM TMidge Minium MD [facility] PEC  05/12/2021  9:45 AM MGardiner Barefoot DPM [facility]  05/22/2021  8:30 AM Shamleffer, IMelanie Crazier MD LBPC-LBENDO None  05/29/2021  7:30 AM GI-BCG DX DEXA 1 GI-BCGDG GI-BREAST CE  06/23/2021  8:15 AM LLendon Colonel NP [facility] 06/28/2021  8:30 AM LBPC-SV CCM PHARMACIST [facility] PEC  07/04/2021  9:00 AM YDeneise Lever MD LBPU-PULCARE None   JMadelin Rear PharmD, CPP Clinical Pharmacist Practitioner  [facility]  [phone]

## 2021-04-10 ENCOUNTER — Other Ambulatory Visit: Payer: Self-pay | Admitting: Family Medicine

## 2021-04-10 ENCOUNTER — Other Ambulatory Visit: Payer: Self-pay | Admitting: Internal Medicine

## 2021-04-11 ENCOUNTER — Other Ambulatory Visit: Payer: Self-pay | Admitting: Family Medicine

## 2021-04-11 DIAGNOSIS — F32A Depression, unspecified: Secondary | ICD-10-CM

## 2021-04-19 ENCOUNTER — Ambulatory Visit: Payer: Medicare Other

## 2021-04-19 DIAGNOSIS — I5032 Chronic diastolic (congestive) heart failure: Secondary | ICD-10-CM

## 2021-04-19 DIAGNOSIS — E1122 Type 2 diabetes mellitus with diabetic chronic kidney disease: Secondary | ICD-10-CM

## 2021-04-19 DIAGNOSIS — G4733 Obstructive sleep apnea (adult) (pediatric): Secondary | ICD-10-CM

## 2021-04-19 DIAGNOSIS — E785 Hyperlipidemia, unspecified: Secondary | ICD-10-CM

## 2021-04-19 DIAGNOSIS — I10 Essential (primary) hypertension: Secondary | ICD-10-CM

## 2021-04-19 NOTE — Patient Instructions (Signed)
Visit Information  PATIENT GOALS:  Goals Addressed             This Visit's Progress    RNCM Monitor and Manage My Diabetes Type 2 and chronic kidney disease   On track    Timeframe:  Long-Range Goal Priority:  Medium Start Date:    08/10/20                         Expected End Date:     09/23/21                 Follow Up Date 06/14/21    - check blood sugar at prescribed times - check blood sugar if I feel it is too high or too low - enter blood sugar readings and medication or insulin into daily log - take the blood sugar log to all doctor visits  -try walking 3-4 times a week and gradually increase time and number of days  -follow diet and fluid recommendations from kidney doctor   Why is this important?   Checking your blood sugar at home helps to keep it from getting very high or very low.  Writing the results in a diary or log helps the doctor know how to care for you.  Your blood sugar log should have the time, date and the results.  Also, write down the amount of insulin or other medicine that you take.  Other information, like what you ate, exercise done and how you were feeling, will also be helpful.     Notes:      RNCM:Better track and self manage heart failure symptoms   On track    Timeframe:  Long-Range Goal Priority:  High Start Date:  08/10/20                           Expected End Date: 09/23/21                      Follow Up Date 06/14/21    - begin a heart failure diary - bring diary to all appointments - develop a rescue plan - eat more whole grains, fruits and vegetables, lean meats and healthy fats - follow rescue plan if symptoms flare-up - know when to call the doctor - track symptoms and what helps feel better or worse - dress right for the weather, hot or cold    Why is this important?   You will be able to handle your symptoms better if you keep track of them.  Making some simple changes to your lifestyle will help.  Eating healthy is one  thing you can do to take good care of yourself.         Preventing Unhealthy Weight Gain, Adult Staying at a healthy weight is important to your overall health. When fat builds up in your body, you may become overweight or obese. Being overweight or obese increases your risk of developing certain health problems, such as heart disease, diabetes, sleeping problems, joint problems, and some types of cancer. Unhealthy weight gain is often the result of making unhealthy food choices or not getting enough exercise. You can make changes to your lifestyle to prevent obesity and stay as healthy as possible. What nutrition changes can be made?  Eat only as much as your body needs. To do this: Pay attention to signs that you are hungry or full. Stop  eating as soon as you feel full. If you feel hungry, try drinking water first before eating. Drink enough water so your urine is clear or pale yellow. Eat smaller portions. Pay attention to portion sizes when eating out. Look at serving sizes on food labels. Most foods contain more than one serving per container. Eat the recommended number of calories for your gender and activity level. For most active people, a daily total of 2,000 calories is appropriate. If you are trying to lose weight or are not very active, you may need to eat fewer calories. Talk with your health care provider or a diet and nutrition specialist (dietitian) about how many calories you need each day. Choose healthy foods, such as: Fruits and vegetables. At each meal, try to fill at least half of your plate with fruits and vegetables. Whole grains, such as whole-wheat bread, brown rice, and quinoa. Lean meats, such as chicken or fish. Other healthy proteins, such as beans, eggs, or tofu. Healthy fats, such as nuts, seeds, fatty fish, and olive oil. Low-fat or fat-free dairy products. Check food labels, and avoid food and drinks that: Are high in calories. Have added sugar. Are high in  sodium. Have saturated fats or trans fats. Cook foods in healthier ways, such as by baking, broiling, or grilling. Make a meal plan for the week, and shop with a grocery list to help you stay on track with your purchases. Try to avoid going to the grocery store when you are hungry. When grocery shopping, try to shop around the outside of the store first, where the fresh foods are. Doing this helps you to avoid prepackaged foods, which can be high in sugar, salt (sodium), and fat. What lifestyle changes can be made?  Exercise for 30 or more minutes on 5 or more days each week. Exercising may include brisk walking, yard work, biking, running, swimming, and team sports like basketball and soccer. Ask your health care provider which exercises are safe for you. Do muscle-strengthening activities, such as lifting weights or using resistance bands, on 2 or more days a week. Do not use any products that contain nicotine or tobacco, such as cigarettes and e-cigarettes. If you need help quitting, ask your health care provider. Limit alcohol intake to no more than 1 drink a day for nonpregnant women and 2 drinks a day for men. One drink equals 12 oz of beer, 5 oz of wine, or 1 oz of hard liquor. Try to get 7-9 hours of sleep each night. What other changes can be made? Keep a food and activity journal to keep track of: What you ate and how many calories you had. Remember to count the calories in sauces, dressings, and side dishes. Whether you were active, and what exercises you did. Your calorie, weight, and activity goals. Check your weight regularly. Track any changes. If you notice you have gained weight, make changes to your diet or activity routine. Avoid taking weight-loss medicines or supplements. Talk to your health care provider before starting any new medicine or supplement. Talk to your health care provider before trying any new diet or exercise plan. Why are these changes important? Eating  healthy, staying active, and having healthy habits can help you to prevent obesity. Those changes also: Help you manage stress and emotions. Help you connect with friends and family. Improve your self-esteem. Improve your sleep. Prevent long-term health problems. What can happen if changes are not made? Being obese or overweight can cause you to  develop joint or bone problems, which can make it hard for you to stay active or do activities you enjoy. Being obese or overweight also puts stress on your heart and lungs and can lead to health problems like diabetes, heart disease, and some cancers. Where to find more information Talk with your health care provider or a dietitian about healthy eating and healthy lifestyle choices. You may also find information from: U.S. Department of Agriculture, MyPlate: https://ball-collins.biz/ American Heart Association: www.heart.org Centers for Disease Control and Prevention: FootballExhibition.com.br Summary Staying at a healthy weight is important to your overall health. It helps you to prevent certain diseases and health problems, such as heart disease, diabetes, joint problems, sleep disorders, and some types of cancer. Being obese or overweight can cause you to develop joint or bone problems, which can make it hard for you to stay active or do activities you enjoy. You can prevent unhealthy weight gain by eating a healthy diet, exercising regularly, not smoking, limiting alcohol, and getting enough sleep. Talk with your health care provider or a dietitian for guidance about healthy eating and healthy lifestyle choices. This information is not intended to replace advice given to you by your health care provider. Make sure you discuss any questions you have with your health care provider. Document Revised: 09/21/2019 Document Reviewed: 10/08/2019 Elsevier Patient Education  2022 Elsevier Inc.   Patient verbalizes understanding of instructions provided today and agrees to  view in [place].    Telephone follow up appointment with care management team member scheduled for: 06/14/21 at 3:30 PM  Dudley Major RN, [facility], CDE Care Management Coordinator [facility] [phone], Mobile [phone]

## 2021-04-19 NOTE — Chronic Care Management (AMB) (Signed)
Chronic Care Management   CCM RN Visit Note  04/19/2021 Name: Kayla Brown MRN: [id number] DOB: 21-Dec-1950  Subjective: Kayla Brown is a 70 y.o. year old female who is a primary care patient of Birdie Riddle, [name], MD. The care management team was consulted for assistance with disease management and care coordination needs.    Engaged with patient by telephone for follow up visit in response to provider referral for case management and/or care coordination services.   Consent to Services:  The patient was given information about Chronic Care Management services, agreed to services, and gave verbal consent prior to initiation of services.  Please see initial visit note for detailed documentation.   Patient agreed to services and verbal consent obtained.   Assessment: Review of patient past medical history, allergies, medications, health status, including review of consultants reports, laboratory and other test data, was performed as part of comprehensive evaluation and provision of chronic care management services.   SDOH (Social Determinants of Health) assessments and interventions performed:    CCM Care Plan  No Known Allergies  Outpatient Encounter Medications as of 04/19/2021  Medication Sig Note   albuterol (VENTOLIN HFA) 108 (90 Base) MCG/ACT inhaler Inhale 1-2 puffs into the lungs every 6 (six) hours as needed for wheezing or shortness of breath.    Alcohol Swabs (ALCOHOL PREP) PADS Pt uses an alcohol pad each time sugars are tested. Pt tests twice daily. Dx E11.9    amLODipine (NORVASC) 10 MG tablet TAKE 1 TABLET BY MOUTH EVERY DAY    atorvastatin (LIPITOR) 40 MG tablet TAKE 1 TABLET BY MOUTH EVERY DAY    B Complex Vitamins (B COMPLEX PO) Take 1 tablet by mouth daily.    buPROPion (WELLBUTRIN XL) 150 MG 24 hr tablet TAKE 1 TABLET BY MOUTH EVERY DAY    Calcium Carb-Cholecalciferol [phone] MG-UNIT CAPS Take 1 tablet by mouth 2 (two) times daily.    carvedilol (COREG) 25 MG  tablet TAKE 1 TABLET BY MOUTH TWICE A DAY    dapagliflozin propanediol (FARXIGA) 10 MG TABS tablet Take 10 mg by mouth daily. 01/25/2021: Gets through patient assistance.    fish oil-omega-3 fatty acids 1000 MG capsule Take 1 g by mouth daily.    fluticasone (FLONASE) 50 MCG/ACT nasal spray SPRAY 2 SPRAYS INTO EACH NOSTRIL EVERY DAY (Patient taking differently: Place 2 sprays into both nostrils daily.)    furosemide (LASIX) 20 MG tablet TAKE 1 TABLET BY MOUTH EVERY DAY    gabapentin (NEURONTIN) 100 MG capsule Take 100 mg by mouth 2 (two) times daily.    glipiZIDE (GLUCOTROL) 5 MG tablet Take 1 tablet (5 mg total) by mouth 2 (two) times daily before a meal.    glucose blood (FREESTYLE LITE) test strip USE TO TEST BLOOD GLUCOSE 2 TIMES DAILY. Dx E11.9    loratadine (CLARITIN) 10 MG tablet TAKE 1 TABLET BY MOUTH EVERY DAY (Patient taking differently: Take 10 mg by mouth daily.)    Melatonin 3 MG TABS Take 10 mg by mouth at bedtime.     metFORMIN (GLUCOPHAGE-XR) 500 MG 24 hr tablet TAKE 2 TABLETS (1,000 MG TOTAL) BY MOUTH IN THE MORNING AND AT BEDTIME.    methocarbamol (ROBAXIN) 500 MG tablet Take 500 mg by mouth 2 (two) times daily.    Multiple Vitamins-Minerals (VISION FORMULA) TABS Take 1 tablet by mouth daily.    OVER THE COUNTER MEDICATION Take 1 tablet by mouth daily. Equate Vision Formula 50+    prednisoLONE acetate (PRED FORTE)  1 % ophthalmic suspension SMARTSIG:In Eye(s)    sertraline (ZOLOFT) 50 MG tablet TAKE 1 TABLET BY MOUTH EVERY DAY    traMADol (ULTRAM) 50 MG tablet Take 1 tablet (50 mg total) by mouth every 6 (six) hours as needed.    TRUEPLUS LANCETS 30G MISC Use one lancet each time sugars are tested. Pt tests twice daily. Dx. E11.9    UNABLE TO FIND Med Name: Healthwise talking meter    No facility-administered encounter medications on file as of 04/19/2021.    Patient Active Problem List   Diagnosis Date Noted   Reactive airway disease 01/30/2021   Morbid obesity ([place])  11/18/2020   Osteopenia 11/18/2020   Type 2 diabetes mellitus with stage 3a chronic kidney disease, without long-term current use of insulin ([place]) 07/21/2020   Type 2 diabetes mellitus with hyperglycemia, with long-term current use of insulin ([place]) 04/19/2020   Lumbar radiculopathy 03/02/2020   Left hip pain 12/01/2019   Type 2 diabetes mellitus with stage 3b chronic kidney disease, without long-term current use of insulin ([place]) 40/03/2724   Diastolic CHF ([place]) 36/64/4034   Dyspnea 03/08/2019   Pulmonary edema 03/04/2019   Physical exam 02/06/2017   Anxiety and depression 12/05/2016   Obesity (BMI 30-39.9) 03/01/2016   OSA (obstructive sleep apnea) 07/11/2015   Pulmonary hypertension ([place]) 05/06/2015   Heart murmur 03/03/2015   Essential hypertension, benign 03/18/2014   Hyperlipidemia 03/18/2014   Post-menopause on HRT (hormone replacement therapy) 03/18/2014    Conditions to be addressed/monitored:CHF, HTN, HLD, and DMII  Care Plan : RNCM:Chronic Kidney (Adult)  Updates made by [name], RN since 04/19/2021 12:00 AM     Problem: RNCM: Need for long term self management plan for chronic kidney disease   Priority: Medium     Long-Range Goal: RNCM : Disease Progression Prevented or Minimized   Start Date: 08/10/2020  Expected End Date: 09/23/2021  This Visit's Progress: On track  Recent Progress: On track  Priority: Medium  Note:   Current Barriers:  Knowledge Deficits related to Chronic kidney disease stage 3  now being followed by nephrology  Does not adhere to provider recommendations re: diet and fluids recommendations at times-reports avoiding dark sodas and drinking water instead States she will see Dr.Upton again in 8 months. reports B/P readings 120130/60-80.  States she is picking up her [place]ade at Dr. Bishop [place] office with out any issues Nurse Case Manager Clinical Goal(s):  patient will verbalize understanding of plan for self management of chronic kidney  disease patient will work with CCM team and primary care provider to address needs related to chronic kidney disease patient will attend all scheduled medical appointments: CCM PharmD 14/23,Dr. Birdie Riddle 04/26/21, Dr. Kelton Pillar 05/22/21  Dr.Upton March 2023 patient will verbalize basic understanding of chronic kidney disease process and self health management plan as evidenced by no worsening of kidney function, adherence to diet and fluid recommendations  patient will work with CM team pharmacist to self manage medications the patient will demonstrate ongoing self health care management ability as evidenced by adherence to plan of care, medications, diet and provider appts, call providers with problems or concerns*  Interventions:  1:1 collaboration with [name], MD regarding development and update of comprehensive plan of care as evidenced by provider attestation and co-signature Inter-disciplinary care team collaboration (see longitudinal plan of care) Evaluation of current treatment plan related to chronic kidney disease and patient's adherence to plan as established by provider. Reinforced education to patient re: chronic kidney  disease self management Reinforced importance of keeping blood sugar and B/P under control to help prevent further kidney damage Reviewed medications with patient and discussed to call CCM team for any problems or concerns Reviewed scheduled/upcoming provider appointments including: CCM Pharm D 06/28/21, Dr. Birdie Riddle 04/26/21, Dr. Kelton Pillar 05/22/21, [facility] March 2023,  Discussed plans with patient for ongoing care management follow up and provided patient with direct contact information for care management team Reinforced to follow dietary recommendations of nephrology  Patient Goals/Self-Care Activities  - Patient will self administer medications as prescribed Patient will attend all scheduled provider appointments Patient will call pharmacy for  medication refills Patient will call provider office for new concerns or questions -try walking 3-4 times a week and gradually increase time and number of days  -follow diet and fluid recommendations from kidney doctor Follow Up Plan: Telephone follow up appointment with care management team member scheduled for: 06/14/21 at 3:30 PM The patient has been provided with contact information for the care management team and has been advised to call with any health related questions or concerns.          Care Plan : GDJM:EQAST Failure (Adult)  Updates made by [name], RN since 04/19/2021 12:00 AM     Problem: RNCM: Knowledge deficit related to long term self management of heart failure   Priority: High     Long-Range Goal: Effective self mangement of heart failure   Start Date: 08/10/2020  Expected End Date: 04/24/2021  This Visit's Progress: On track  Recent Progress: On track  Priority: High  Note:   Current Barriers:  Does not adhere to provider recommendations re: diet and fluid recommendations at times Reports weighing daily with weight stable at 178 lb this morning. States her weights range from 174-178. Reports she has not needed to take extra Lasix since the end of May.  Denies any swelling, shortness of breath or chest pains, Reports trying to follow a low sodium diet.  States she tries to walk 3 times a week for 10-15 minutes Case Manager Clinical Goal(s):  patient will weigh self daily and record patient will verbalize understanding of Heart Failure Action Plan and when to call doctor patient will take all Heart Failure mediations as prescribed patient will weigh daily and record (notifying MD of 3 lb weight gain over night or 5 lb in a week) Interventions:  Collaboration with [name], MD regarding development and update of comprehensive plan of care as evidenced by provider attestation and co-signature Inter-disciplinary care team collaboration (see  longitudinal plan of care) Reviewed upcoming appointment with cardiology 06/23/21 Basic overview and discussion of pathophysiology of Heart Failure reviewed  Reinforced verbal education on low sodium diet Reinforced Heart Failure Action Plan   Reinforced placing scale on hard, flat surface Discussed importance of daily weight and advised patient to weigh and record daily Reinforced role of diuretics in prevention of fluid overload and management of heart failure Reinforced importance of using CPAP for sleep apnea to help with her heart and B/P Reviewed importance of regular exercise and to avoid weight gain to help with B/P, CBGs and weight Patient Goals/Self-Care Activities - begin a heart failure diary - bring diary to all appointments - develop a rescue plan - eat more whole grains, fruits and vegetables, lean meats and healthy fats - follow rescue plan if symptoms flare-up - know when to call the doctor - track symptoms and what helps feel better or worse Follow Up Plan:  Telephone follow up appointment with care management team member scheduled for: 06/14/21 at 3:30 PM The patient has been provided with contact information for the care management team and has been advised to call with any health related questions or concerns.      Care Plan : RNCM:Diabetes Type 2 (Adult)  Updates made by [name], RN since 04/19/2021 12:00 AM     Problem: RNCM: Need for effective self management of Type 2 Diabetes   Priority: Medium     Long-Range Goal: RNCM:Effective self mangement of Type 2 Diabetes   Start Date: 08/10/2020  Expected End Date: 04/24/2021  This Visit's Progress: On track  Recent Progress: On track  Priority: Medium  Note:   Objective:  Lab Results  Component Value Date   HGBA1C 6.7 (A) 07/21/2020   Lab Results  Component Value Date   CREATININE 1.26 (H) 07/21/2020   CREATININE 1.24 (H) 05/23/2020   CREATININE 1.08 11/19/2019   No results found for:  EGFR Current Barriers:  Knowledge Deficits related to basic Diabetes pathophysiology and self care/management Difficulty obtaining or cannot afford medications Does not adhere to provider recommendations re: ADA/low sodium diet and exercise at times Reports currently getting patient assistance for Farxiga, states she is now picking up at Dr. Bishop [place] office.  States her   CBGs range 110-190 fasting and around 130 later in the day. States she was feeling sick the day she had the 190 reading but it was better the next day. Denies any hypoglycemia, States she and her husband have been working harder to eat healthier with more vegetables. States she is  walking 3 times a week weather permitting and doing exercises that PT taught her several times a week to help with her chronic low back pain.  States her back pain is starting to come back and she goes to see her back doctor in December Case Manager Clinical Goal(s):  patient will demonstrate improved adherence to prescribed treatment plan for diabetes self care/management as evidenced by: daily monitoring and recording of CBG  adherence to ADA/ carb modified diet exercise 3-4 days/week adherence to prescribed medication regimen contacting provider for new or worsened symptoms or questions Interventions:  Collaboration with [name], MD regarding development and update of comprehensive plan of care as evidenced by provider attestation and co-signature Inter-disciplinary care team collaboration (see longitudinal plan of care)  Reinforced education about basic DM disease process Reviewed medications with patient and discussed importance of medication adherence Discussed plans with patient for ongoing care management follow up and provided patient with direct contact information for care management team Reinforced s/sx of hypoglycemia and hyperglycemia and importance of correct treatment Reviewed scheduled/upcoming provider appointments including:  CCM PharmD 06/28/21, Dr. Birdie Riddle 04/26/21, Dr. Kelton Pillar 05/22/21, Dr. Hollie Salk March 2023, cardiology 06/23/21 Reinforced to check cbg daily and record, calling primary care provider for findings outside established parameters.   Review of patient status, including review of consultants reports, relevant laboratory and other test results, and medications completed. Reinforced fasting blood sugar goals of 80-130 and less than 180 1 1/2-2 hours after meals Reinforced to follow a low carbohydrate low salt diet and to watch portion sizes Reinforced to increase activity as tolerated Patient Goals/Self-Care Activities - Self administers oral medications as prescribed Attends all scheduled provider appointments Checks blood sugars as prescribed and utilize hyper and hypoglycemia protocol as needed Adheres to prescribed ADA/carb modified - check blood sugar at prescribed times - check blood sugar if I feel it  is too high or too low - enter blood sugar readings and medication or insulin into daily log - take the blood sugar log to all doctor visits try walking 3-4 times a week and gradually increase time and number of days  -follow diet and fluid recommendations from kidney doctor Follow Up Plan: Telephone follow up appointment with care management team member scheduled for: 06/14/21 at 3:30 PM The patient has been provided with contact information for the care management team and has been advised to call with any health related questions or concerns.       Plan:Telephone follow up appointment with care management team member scheduled for:  06/14/21 The patient has been provided with contact information for the care management team and has been advised to call with any health related questions or concerns.  Peter Garter RN, BSN,CCM, CDE Care Management Coordinator [place] [phone], Mobile [phone]

## 2021-04-24 DIAGNOSIS — E785 Hyperlipidemia, unspecified: Secondary | ICD-10-CM

## 2021-04-24 DIAGNOSIS — F419 Anxiety disorder, unspecified: Secondary | ICD-10-CM

## 2021-04-24 DIAGNOSIS — F32A Depression, unspecified: Secondary | ICD-10-CM

## 2021-04-24 DIAGNOSIS — I5032 Chronic diastolic (congestive) heart failure: Secondary | ICD-10-CM | POA: Diagnosis not present

## 2021-04-24 DIAGNOSIS — E1122 Type 2 diabetes mellitus with diabetic chronic kidney disease: Secondary | ICD-10-CM | POA: Diagnosis not present

## 2021-04-24 DIAGNOSIS — I10 Essential (primary) hypertension: Secondary | ICD-10-CM | POA: Diagnosis not present

## 2021-04-24 DIAGNOSIS — N1831 Chronic kidney disease, stage 3a: Secondary | ICD-10-CM

## 2021-04-26 ENCOUNTER — Encounter: Payer: Self-pay | Admitting: Family Medicine

## 2021-04-26 ENCOUNTER — Ambulatory Visit (INDEPENDENT_AMBULATORY_CARE_PROVIDER_SITE_OTHER): Payer: Medicare Other | Admitting: Family Medicine

## 2021-04-26 ENCOUNTER — Other Ambulatory Visit: Payer: Self-pay

## 2021-04-26 VITALS — BP 128/62 | HR 62 | Temp 97.5°F | Resp 16 | Wt 180.0 lb

## 2021-04-26 DIAGNOSIS — I10 Essential (primary) hypertension: Secondary | ICD-10-CM

## 2021-04-26 DIAGNOSIS — Z23 Encounter for immunization: Secondary | ICD-10-CM

## 2021-04-26 DIAGNOSIS — E785 Hyperlipidemia, unspecified: Secondary | ICD-10-CM

## 2021-04-26 LAB — CBC WITH DIFFERENTIAL/PLATELET
Basophils Absolute: 0.1 10*3/uL (ref 0.0–0.1)
Basophils Relative: 1.2 % (ref 0.0–3.0)
Eosinophils Absolute: 0.3 10*3/uL (ref 0.0–0.7)
Eosinophils Relative: 3.8 % (ref 0.0–5.0)
HCT: 39.2 % (ref 36.0–46.0)
Hemoglobin: 12.8 g/dL (ref 12.0–15.0)
Lymphocytes Relative: 24.3 % (ref 12.0–46.0)
Lymphs Abs: 1.9 10*3/uL (ref 0.7–4.0)
MCHC: 32.7 g/dL (ref 30.0–36.0)
MCV: 93.1 fl (ref 78.0–100.0)
Monocytes Absolute: 0.9 10*3/uL (ref 0.1–1.0)
Monocytes Relative: 11.1 % (ref 3.0–12.0)
Neutro Abs: 4.7 10*3/uL (ref 1.4–7.7)
Neutrophils Relative %: 59.6 % (ref 43.0–77.0)
Platelets: 211 10*3/uL (ref 150.0–400.0)
RBC: 4.2 Mil/uL (ref 3.87–5.11)
RDW: 14 % (ref 11.5–15.5)
WBC: 7.9 10*3/uL (ref 4.0–10.5)

## 2021-04-26 LAB — LIPID PANEL
Cholesterol: 126 mg/dL (ref 0–200)
HDL: 54.3 mg/dL (ref >39.00)
LDL Cholesterol: 56 mg/dL (ref 0–99)
NonHDL: 71.77
Total CHOL/HDL Ratio: 2
Triglycerides: 77 mg/dL (ref 0.0–149.0)
VLDL: 15.4 mg/dL (ref 0.0–40.0)

## 2021-04-26 LAB — BASIC METABOLIC PANEL
BUN: 38 mg/dL — ABNORMAL HIGH (ref 6–23)
CO2: 29 mEq/L (ref 19–32)
Calcium: 9.8 mg/dL (ref 8.4–10.5)
Chloride: 106 mEq/L (ref 96–112)
Creatinine, Ser: 1.6 mg/dL — ABNORMAL HIGH (ref 0.40–1.20)
GFR: 32.8 mL/min — ABNORMAL LOW (ref >60.00)
Glucose, Bld: 149 mg/dL — ABNORMAL HIGH (ref 70–99)
Potassium: 4.2 mEq/L (ref 3.5–5.1)
Sodium: 142 mEq/L (ref 135–145)

## 2021-04-26 LAB — HEPATIC FUNCTION PANEL
ALT: 20 U/L (ref 0–35)
AST: 14 U/L (ref 0–37)
Albumin: 4.1 g/dL (ref 3.5–5.2)
Alkaline Phosphatase: 58 U/L (ref 39–117)
Bilirubin, Direct: 0.1 mg/dL (ref 0.0–0.3)
Total Bilirubin: 0.3 mg/dL (ref 0.2–1.2)
Total Protein: 6.7 g/dL (ref 6.0–8.3)

## 2021-04-26 LAB — TSH: TSH: 1.59 u[IU]/mL (ref 0.35–5.50)

## 2021-04-26 NOTE — Assessment & Plan Note (Signed)
Chronic problem.  Well controlled on Amlodipine, Coreg, and Lasix.  Currently asymptomatic.  Check labs.  Anticipatory guidance provided.

## 2021-04-26 NOTE — Patient Instructions (Signed)
Follow up in 6 months to recheck BP and cholesterol We'll notify you of your lab results and make any changes if needed Continue to work on healthy diet and regular exercise- you can do it!! Call with any questions or concerns Stay Safe!  Stay Healthy! Happy Holidays!!! 

## 2021-04-26 NOTE — Progress Notes (Signed)
   Subjective:    Patient ID: Kayla Brown, female    DOB: 03/09/1951, 70 y.o.   MRN: [id number]  HPI HTN- chronic problem, on Amlodipine 10mg  daily, Coreg 25mg  BID, Lasix 20mg  daily w/ good control.  No CP, SOB, HAs, visual changes, edema.  Hyperlipidemia- chronic problem, on Lipitor 40mg  daily.  No abd pain, N/V.  Obesity- pt gained 5 lbs since last visit.  BMI now 35.15  pt attempts to walk somewhat but she is limited by her back pain   Review of Systems For ROS see HPI   This visit occurred during the SARS-CoV-2 public health emergency.  Safety protocols were in place, including screening questions prior to the visit, additional usage of staff PPE, and extensive cleaning of exam room while observing appropriate contact time as indicated for disinfecting solutions.      Objective:   Physical Exam Vitals reviewed.  Constitutional:      General: She is not in acute distress.    Appearance: She is well-developed. She is obese.  HENT:     Head: Normocephalic and atraumatic.  Eyes:     Conjunctiva/sclera: Conjunctivae normal.     Pupils: Pupils are equal, round, and reactive to light.  Neck:     Thyroid: No thyromegaly.  Cardiovascular:     Rate and Rhythm: Normal rate and regular rhythm.     Pulses: Normal pulses.     Heart sounds: Normal heart sounds. No murmur heard. Pulmonary:     Effort: Pulmonary effort is normal. No respiratory distress.     Breath sounds: Normal breath sounds.  Abdominal:     General: There is no distension.     Palpations: Abdomen is soft.     Tenderness: There is no abdominal tenderness.  Musculoskeletal:     Cervical back: Normal range of motion and neck supple.     Right lower leg: No edema.     Left lower leg: No edema.  Lymphadenopathy:     Cervical: No cervical adenopathy.  Skin:    General: Skin is warm and dry.  Neurological:     Mental Status: She is alert and oriented to person, place, and time.  Psychiatric:        Mood and  Affect: Mood normal.        Behavior: Behavior normal.          Assessment & Plan:

## 2021-04-26 NOTE — Assessment & Plan Note (Signed)
Chronic problem, on Lipitor 40mg daily w/o difficulty.  Check labs.  Adjust meds prn  

## 2021-04-26 NOTE — Assessment & Plan Note (Signed)
Pt's BMI of 35.15 coupled w/ her other medical issues qualifies as morbidly obese.  She has gained another 5 lbs since last visit.  Stressed need for healthy diet and regular exercise.  Will continue to follow.

## 2021-04-27 ENCOUNTER — Telehealth: Payer: Self-pay

## 2021-04-27 ENCOUNTER — Other Ambulatory Visit: Payer: Self-pay

## 2021-04-27 DIAGNOSIS — R7989 Other specified abnormal findings of blood chemistry: Secondary | ICD-10-CM

## 2021-04-27 NOTE — Progress Notes (Signed)
Chronic Care Management Pharmacy Assistant   Name: Kayla Brown  MRN: [id number] DOB: [birth year]   Reason for Encounter: Disease State - Diabetes Call     Recent office visits:  04/26/21 Neena Rhymes, MD (PCP) - Family Medicine - Hypertension - Labs were ordered. No medication changes. Follow up in 6 months.   Recent consult visits:  None noted.   Hospital visits:  None in previous 6 months  Medications: Outpatient Encounter Medications as of 04/27/2021  Medication Sig Note   albuterol (VENTOLIN HFA) 108 (90 Base) MCG/ACT inhaler Inhale 1-2 puffs into the lungs every 6 (six) hours as needed for wheezing or shortness of breath.    Alcohol Swabs (ALCOHOL PREP) PADS Pt uses an alcohol pad each time sugars are tested. Pt tests twice daily. Dx E11.9    amLODipine (NORVASC) 10 MG tablet TAKE 1 TABLET BY MOUTH EVERY DAY    atorvastatin (LIPITOR) 40 MG tablet TAKE 1 TABLET BY MOUTH EVERY DAY    B Complex Vitamins (B COMPLEX PO) Take 1 tablet by mouth daily.    buPROPion (WELLBUTRIN XL) 150 MG 24 hr tablet TAKE 1 TABLET BY MOUTH EVERY DAY    Calcium Carb-Cholecalciferol [phone] MG-UNIT CAPS Take 1 tablet by mouth 2 (two) times daily.    carvedilol (COREG) 25 MG tablet TAKE 1 TABLET BY MOUTH TWICE A DAY    dapagliflozin propanediol (FARXIGA) 10 MG TABS tablet Take 10 mg by mouth daily. 01/25/2021: Gets through patient assistance.    fish oil-omega-3 fatty acids 1000 MG capsule Take 1 g by mouth daily.    fluticasone (FLONASE) 50 MCG/ACT nasal spray SPRAY 2 SPRAYS INTO EACH NOSTRIL EVERY DAY (Patient taking differently: Place 2 sprays into both nostrils daily.)    furosemide (LASIX) 20 MG tablet TAKE 1 TABLET BY MOUTH EVERY DAY    gabapentin (NEURONTIN) 100 MG capsule Take 100 mg by mouth 2 (two) times daily.    glipiZIDE (GLUCOTROL) 5 MG tablet Take 1 tablet (5 mg total) by mouth 2 (two) times daily before a meal.    glucose blood (FREESTYLE LITE) test strip USE TO TEST BLOOD GLUCOSE 2  TIMES DAILY. Dx E11.9    loratadine (CLARITIN) 10 MG tablet TAKE 1 TABLET BY MOUTH EVERY DAY (Patient taking differently: Take 10 mg by mouth daily.)    Melatonin 3 MG TABS Take 10 mg by mouth at bedtime.     metFORMIN (GLUCOPHAGE-XR) 500 MG 24 hr tablet TAKE 2 TABLETS (1,000 MG TOTAL) BY MOUTH IN THE MORNING AND AT BEDTIME.    methocarbamol (ROBAXIN) 500 MG tablet Take 500 mg by mouth 2 (two) times daily.    Multiple Vitamins-Minerals (VISION FORMULA) TABS Take 1 tablet by mouth daily.    OVER THE COUNTER MEDICATION Take 1 tablet by mouth daily. Equate Vision Formula 50+    prednisoLONE acetate (PRED FORTE) 1 % ophthalmic suspension SMARTSIG:In Eye(s)    sertraline (ZOLOFT) 50 MG tablet TAKE 1 TABLET BY MOUTH EVERY DAY    traMADol (ULTRAM) 50 MG tablet Take 1 tablet (50 mg total) by mouth every 6 (six) hours as needed.    TRUEPLUS LANCETS 30G MISC Use one lancet each time sugars are tested. Pt tests twice daily. Dx. E11.9    UNABLE TO FIND Med Name: Healthwise talking meter    No facility-administered encounter medications on file as of 04/27/2021.    Current antihyperglycemic regimen:  Farxiga 10 mg once daily  Glipizide 5 mg twice daily before a meal  Metformin XR 500 mg twice daily    What recent interventions/DTPs have been made to improve glycemic control:  Patient denied any recent changes in medication regimen.   Have there been any recent hospitalizations or ED visits since last visit with CPP?  Patient has not had any hospitalizations or ED visits since last visit with CPP.   Patient denies hypoglycemic symptoms, including Pale, Sweaty, Shaky, Hungry, Nervous/irritable, and Vision changes   Patient denies hyperglycemic symptoms, including blurry vision, excessive thirst, fatigue, polyuria, and weakness   How often are you checking your blood sugar? Patient reports she is checking her blood sugars daily and has noticed elevated blood sugars in am stated she has been stressed  and not doing as well watching her diet but she is working on this.    What are your blood sugars ranging?  Fasting: 169 Before meals:  After meals:  Bedtime:   During the week, how often does your blood glucose drop below 70?  Patient reported she has not had any readings lower than 70.  Are you checking your feet daily/regularly? Patient reported she is checking her feet regularly.    Adherence Review: Is the patient currently on a STATIN medication? Yes Is the patient currently on ACE/ARB medication? No Does the patient have >5 day gap between last estimated fill dates? No  Farxiga 10 mg once daily - receiving through PAP  Glipizide 5 mg twice daily before a meal - last filled 03/09/21 90 days  Metformin XR 500 mg twice daily - last filled 04/10/21 90 days    Care Gaps  AWV: done 12/12/20 Colonoscopy: done 12/14/16 DM Eye Exam: due 08/01/21 DM Foot Exam: overdue 03/04/22 Microalbumin: due 05/23/21 HbgAIC: done 01/18/21 (7.2) DEXA: scheduled 05/29/21 Mammogram: done 02/06/21   Star Rating Drugs: Metformin (GLUCOPHAGE-XR) 500 MG 24 hr tablet - last filled 04/10/21 90 days  Glipizide (GLUCOTROL) 5 MG tablet - last filled 03/09/21 90 days  Atorvastatin (LIPITOR) 40 MG tablet - last filled 02/19/21 90 days   Future Appointments  Date Time Provider Department Center  05/09/2021  8:00 AM SV-LAB [facility]  05/12/2021  9:45 AM Helane Gunther, DPM [facility]  05/22/2021  8:30 AM Shamleffer, Konrad Dolores, MD [facility] None  05/29/2021  7:30 AM GI-BCG DX DEXA 1 [facility] GI-BREAST CE  06/14/2021  3:30 PM [facility] SF-CCM CARE MGR [facility]  06/23/2021  8:20 AM Jodelle Gross, NP [facility]  06/28/2021  8:30 AM [facility] CCM PHARMACIST [facility]  07/04/2021  9:00 AM Waymon Budge, MD [facility]-PULCARE None  10/24/2021  8:30 AM Sheliah Hatch, MD [facility]    [name], CCMA Clinical Pharmacist Assistant  [phone]  Time Spent: 35 minutes

## 2021-05-09 ENCOUNTER — Other Ambulatory Visit (INDEPENDENT_AMBULATORY_CARE_PROVIDER_SITE_OTHER): Payer: Medicare Other

## 2021-05-09 DIAGNOSIS — R7989 Other specified abnormal findings of blood chemistry: Secondary | ICD-10-CM | POA: Diagnosis not present

## 2021-05-09 LAB — BASIC METABOLIC PANEL
BUN: 36 mg/dL — ABNORMAL HIGH (ref 6–23)
CO2: 27 mEq/L (ref 19–32)
Calcium: 10 mg/dL (ref 8.4–10.5)
Chloride: 104 mEq/L (ref 96–112)
Creatinine, Ser: 1.61 mg/dL — ABNORMAL HIGH (ref 0.40–1.20)
GFR: 32.55 mL/min — ABNORMAL LOW (ref >60.00)
Glucose, Bld: 147 mg/dL — ABNORMAL HIGH (ref 70–99)
Potassium: 4.2 mEq/L (ref 3.5–5.1)
Sodium: 140 mEq/L (ref 135–145)

## 2021-05-11 ENCOUNTER — Telehealth: Payer: Self-pay | Admitting: Pharmacist

## 2021-05-11 NOTE — Progress Notes (Addendum)
    Chronic Care Management Pharmacy Assistant   Name: Jimmi Sidener  MRN: [id number] DOB: 1950-07-19   Reason for Encounter: PAP renewal for Farxiga for 2023  PAP form completed for Farxiga and will be mailed to patient for her to complete her portion. Patient will then provide her completed portion to prescribing doctor and they will provide prescription and send in completed application for patient. Patient will contact us with outcome update for approval dates if patient is re approved.  [name], [facility] Clinical Pharmacist Assistant  [phone]

## 2021-05-12 ENCOUNTER — Encounter: Payer: Self-pay | Admitting: Podiatry

## 2021-05-12 ENCOUNTER — Other Ambulatory Visit: Payer: Self-pay

## 2021-05-12 ENCOUNTER — Ambulatory Visit (INDEPENDENT_AMBULATORY_CARE_PROVIDER_SITE_OTHER): Payer: Medicare Other | Admitting: Podiatry

## 2021-05-12 DIAGNOSIS — M79674 Pain in right toe(s): Secondary | ICD-10-CM | POA: Diagnosis not present

## 2021-05-12 DIAGNOSIS — N1832 Chronic kidney disease, stage 3b: Secondary | ICD-10-CM

## 2021-05-12 DIAGNOSIS — B351 Tinea unguium: Secondary | ICD-10-CM

## 2021-05-12 DIAGNOSIS — E1122 Type 2 diabetes mellitus with diabetic chronic kidney disease: Secondary | ICD-10-CM | POA: Diagnosis not present

## 2021-05-12 DIAGNOSIS — M79675 Pain in left toe(s): Secondary | ICD-10-CM | POA: Diagnosis not present

## 2021-05-12 NOTE — Progress Notes (Signed)
This patient returns to my office for at risk foot care.  This patient requires this care by a professional since this patient will be at risk due to having diabetes mellitus.  This patient is unable to cut nails herself since the patient cannot reach her nails.These nails are painful walking and wearing shoes.  This patient presents for at risk foot care today.  General Appearance  Alert, conversant and in no acute stress.  Vascular  Dorsalis pedis and posterior tibial  pulses are palpable  bilaterally.  Capillary return is within normal limits  bilaterally. Temperature is within normal limits  bilaterally.  Neurologic  Senn-Weinstein monofilament wire test within normal limits  bilaterally. Muscle power within normal limits bilaterally.  Nails Thick disfigured discolored nails with subungual debris  from hallux to fifth toes bilaterally. No evidence of bacterial infection or drainage bilaterally.  Orthopedic  No limitations of motion  feet .  No crepitus or effusions noted.  No bony pathology or digital deformities noted.  HAV  B/L.  Hammer toes  B/L.  Skin  normotropic skin with no porokeratosis noted bilaterally.  No signs of infections or ulcers noted.     Onychomycosis  Pain in right toes  Pain in left toes  Consent was obtained for treatment procedures.   Mechanical debridement of nails 1-5  bilaterally performed with a nail nipper.  Filed with dremel without incident.    Return office visit                     Told patient to return for periodic foot care and evaluation due to potential at risk complications.   Helane Gunther DPM

## 2021-05-12 NOTE — Progress Notes (Signed)
Left vm for patient to return call, also sent a my chart message advising her of her labs

## 2021-05-22 ENCOUNTER — Ambulatory Visit (INDEPENDENT_AMBULATORY_CARE_PROVIDER_SITE_OTHER): Payer: Medicare Other | Admitting: Internal Medicine

## 2021-05-22 ENCOUNTER — Encounter: Payer: Self-pay | Admitting: Internal Medicine

## 2021-05-22 ENCOUNTER — Other Ambulatory Visit: Payer: Self-pay

## 2021-05-22 VITALS — BP 130/80 | HR 68 | Ht 60.0 in | Wt 179.0 lb

## 2021-05-22 DIAGNOSIS — N1832 Chronic kidney disease, stage 3b: Secondary | ICD-10-CM | POA: Diagnosis not present

## 2021-05-22 DIAGNOSIS — E1165 Type 2 diabetes mellitus with hyperglycemia: Secondary | ICD-10-CM

## 2021-05-22 DIAGNOSIS — E1122 Type 2 diabetes mellitus with diabetic chronic kidney disease: Secondary | ICD-10-CM | POA: Diagnosis not present

## 2021-05-22 DIAGNOSIS — Z794 Long term (current) use of insulin: Secondary | ICD-10-CM

## 2021-05-22 LAB — POCT GLYCOSYLATED HEMOGLOBIN (HGB A1C): Hemoglobin A1C: 7.8 % — AB (ref 4.0–5.6)

## 2021-05-22 LAB — MICROALBUMIN / CREATININE URINE RATIO
Creatinine,U: 52 mg/dL
Microalb Creat Ratio: 1.3 mg/g (ref 0.0–30.0)
Microalb, Ur: <0.7 mg/dL (ref 0.0–1.9)

## 2021-05-22 MED ORDER — GLIPIZIDE 5 MG PO TABS: 7.5000 mg | ORAL_TABLET | Freq: Two times a day (BID) | ORAL | 3 refills | Status: DC

## 2021-05-22 NOTE — Progress Notes (Signed)
Name: Kayla Brown  Age/ Sex: 70 y.o., female   MRN/ DOB: [id number], Oct 27, 1950     PCP: Sheliah Hatch, MD   Reason for Endocrinology Evaluation: Type 2 Diabetes Mellitus  Initial Endocrine Consultative Visit: 04/06/2019    PATIENT IDENTIFIER: Kayla Brown is a 70 y.o. female with a past medical history of HTN, CHF and T2DM . The patient has followed with Endocrinology clinic since 04/06/2019 for consultative assistance with management of her diabetes.  DIABETIC HISTORY:  Kayla Brown was diagnosed with T2DM in 2001. She was unable to start Jardiance due to cost . Actos had to be stopped due to CHF. Her hemoglobin A1c has ranged from 6.7% in 2017, peaking at 8.1% in 2020  On her initial visit to our clinic she had an A1c of 8.0%. She was on [employer] and Metformin. We switched Glipizide XL to regular Glipizide and continued metformin   Was started on farxiga through 03/2020 Metformin reduced by 50% in 06/2020 due to GFR < 45  SUBJECTIVE:   During the last visit (01/18/2021): A1c of 7.2 %. We continued Glipizide and  Farxiga  and decreased Metformin due to low GFR   Today (04/18/2020): Kayla Brown is here for a follow up on diabetes management.  She checks her blood sugars 1 times daily. The patient has had one episode of hypoglycemia at 67 mg/dl    Nephrology- Dr. Bufford Buttner  Denies nausea or diarrhea, has occasional constipation    HOME DIABETES REGIMEN:  Glipizide 5 mg, 1 tablet BID Metformin 500 mg tablet BID Farxiga 10 mg daily - pt assistance    GLUCOSE LOG:  120 - 183 mg/dL   DIABETIC COMPLICATIONS: Microvascular complications:  CKD  Denies: neuropathy , retinopathy  Last eye exam: Completed 07/2020   Macrovascular complications:  CHF Denies: CAD, PVD, CVA  HISTORY:  Past Medical History:  Past Medical History:  Diagnosis Date   Anxiety    Diabetes mellitus    Edema of extremities 03/02/2016   Essential hypertension, benign    Lazy eye     Major depressive disorder, single episode, unspecified    Obesity (BMI 30-39.9) 03/01/2016   OSA (obstructive sleep apnea)    Pulmonary HTN (HCC)    mild with PASP by echo 05/2016   Pure hypercholesterolemia    Retinopathy, due to hypertension    Past Surgical History:  Past Surgical History:  Procedure Laterality Date   CPAP TITRATION  10/14/2015   DILATION AND CURETTAGE OF UTERUS  2005-2008   x2   EYE SURGERY     cataracts   WISDOM TOOTH EXTRACTION     Social History:  reports that she has never smoked. She has never used smokeless tobacco. She reports that she does not drink alcohol and does not use drugs. Family History:  Family History  Adopted: Yes  Problem Relation Age of Onset   Alcohol abuse Mother    ADD / ADHD Mother    Dementia Mother    Breast cancer Neg Hx      HOME MEDICATIONS: Allergies as of 05/22/2021   No Known Allergies      Medication List        Accurate as of May 22, 2021  8:32 AM. If you have any questions, ask your nurse or doctor.          STOP taking these medications    metFORMIN 500 MG 24 hr tablet Commonly known as: GLUCOPHAGE-XR Stopped by: Johnney Ou  Ryelynn Guedea, MD       TAKE these medications    albuterol 108 (90 Base) MCG/ACT inhaler Commonly known as: VENTOLIN HFA Inhale 1-2 puffs into the lungs every 6 (six) hours as needed for wheezing or shortness of breath.   Alcohol Prep Pads Pt uses an alcohol pad each time sugars are tested. Pt tests twice daily. Dx E11.9   amLODipine 10 MG tablet Commonly known as: NORVASC TAKE 1 TABLET BY MOUTH EVERY DAY   atorvastatin 40 MG tablet Commonly known as: LIPITOR TAKE 1 TABLET BY MOUTH EVERY DAY   B COMPLEX PO Take 1 tablet by mouth daily.   buPROPion 150 MG 24 hr tablet Commonly known as: WELLBUTRIN XL TAKE 1 TABLET BY MOUTH EVERY DAY   Calcium Carb-Cholecalciferol [phone] MG-UNIT Caps Take 1 tablet by mouth 2 (two) times daily.   carvedilol 25 MG  tablet Commonly known as: COREG TAKE 1 TABLET BY MOUTH TWICE A DAY   dapagliflozin propanediol 10 MG Tabs tablet Commonly known as: FARXIGA Take 10 mg by mouth daily.   fish oil-omega-3 fatty acids 1000 MG capsule Take 1 g by mouth daily.   fluticasone 50 MCG/ACT nasal spray Commonly known as: FLONASE SPRAY 2 SPRAYS INTO EACH NOSTRIL EVERY DAY What changed: See the new instructions.   furosemide 20 MG tablet Commonly known as: LASIX TAKE 1 TABLET BY MOUTH EVERY DAY   gabapentin 100 MG capsule Commonly known as: NEURONTIN Take 100 mg by mouth 2 (two) times daily.   glipiZIDE 5 MG tablet Commonly known as: GLUCOTROL Take 1.5 tablets (7.5 mg total) by mouth 2 (two) times daily before a meal. What changed: how much to take Changed by: Scarlette Shorts, MD   glucose blood test strip Commonly known as: FREESTYLE LITE USE TO TEST BLOOD GLUCOSE 2 TIMES DAILY. Dx E11.9   loratadine 10 MG tablet Commonly known as: CLARITIN TAKE 1 TABLET BY MOUTH EVERY DAY   melatonin 3 MG Tabs tablet Take 10 mg by mouth at bedtime.   methocarbamol 500 MG tablet Commonly known as: ROBAXIN Take 500 mg by mouth 2 (two) times daily.   OVER THE COUNTER MEDICATION Take 1 tablet by mouth daily. Equate Vision Formula 50+   prednisoLONE acetate 1 % ophthalmic suspension Commonly known as: PRED FORTE SMARTSIG:In Eye(s)   sertraline 50 MG tablet Commonly known as: ZOLOFT TAKE 1 TABLET BY MOUTH EVERY DAY   traMADol 50 MG tablet Commonly known as: ULTRAM Take 1 tablet (50 mg total) by mouth every 6 (six) hours as needed.   TRUEplus Lancets 30G Misc Use one lancet each time sugars are tested. Pt tests twice daily. Dx. E11.9   UNABLE TO FIND Med Name: Healthwise talking meter   Vision Formula Tabs Take 1 tablet by mouth daily.         OBJECTIVE:   Vital Signs: BP 130/80 (BP Location: Left Arm, Patient Position: Sitting, Cuff Size: Small)   Pulse 68   Ht 5' (1.524 m)   Wt 179  lb (81.2 kg)   LMP 06/25/2008   SpO2 95%   BMI 34.96 kg/m   Wt Readings from Last 3 Encounters:  05/22/21 179 lb (81.2 kg)  04/26/21 180 lb (81.6 kg)  01/30/21 175 lb (79.4 kg)     Exam: General: Pt appears well and is in NAD  Neck: General: Supple without adenopathy. Thyroid: Thyroid size normal.  No goiter or nodules appreciated.  Lungs: Clear with good BS bilat with no rales,  rhonchi, or wheezes  Heart: RRR with normal S1 and S2 and no gallops; no murmurs; no rub  Extremities: No pretibial edema.   Neuro: MS is good with appropriate affect, pt is alert and Ox3   DM foot exam: 01/18/2021   The skin of the feet is without sores or ulcerations. The pedal pulses are 2+ on right and 2+ on left. The sensation is decreased to a screening 5.07, 10 gram monofilament bilaterally    DATA REVIEWED:  Lab Results  Component Value Date   HGBA1C 7.8 (A) 05/22/2021   HGBA1C 7.2 (A) 01/18/2021   HGBA1C 6.7 (A) 07/21/2020    Latest Reference Range & Units 05/09/21 08:01  Sodium 135 - 145 mEq/L 140  Potassium 3.5 - 5.1 mEq/L 4.2  Chloride 96 - 112 mEq/L 104  CO2 19 - 32 mEq/L 27  Glucose 70 - 99 mg/dL 340 (H)  BUN 6 - 23 mg/dL 36 (H)  Creatinine 3.70 - 1.20 mg/dL 9.64 (H)  Calcium 8.4 - 10.5 mg/dL 38.3  GFR >81.84 mL/min 32.55 (L)  (H): Data is abnormally high (L): Data is abnormally low    ASSESSMENT / PLAN / RECOMMENDATIONS:   1) Type 2 Diabetes Mellitus, Poorly controlled, With CKD III complications - Most recent A1c of 7.8 %. Goal A1c < 7.0%.     - Pt admits to dietary indiscretions , we discussed the sugar-free products need to be portion controlled as they have carbohydrates  - Will stop Metformin due to low GFR of 32  - Will increase Glipizide as below  - Pt to notify me should her glucose > 180 mg/dL  - She was advised to alternate between fasting and supper glucose checks    MEDICATIONS: Increase  Glipizide 5 mg, to 1.5  tablet BID  Continue Farxiga 10 mg, 1  tablet with breakfast  STOP Metformin    EDUCATION / INSTRUCTIONS: BG monitoring instructions: Patient is instructed to check her blood sugars 1 times a day, fasting  Call [facility] if: BG persistently < 70  I reviewed the Rule of 15 for the treatment of hypoglycemia in detail with the patient. Literature supplied.     F/U in 4 months    Signed electronically by: Lyndle Herrlich, MD  [facility] Endocrinology  [facility] [address] [place] [place], [place] [address] Phone: [phone] FAX: [phone]   CC: Sheliah Hatch, MD [address] [place] [address] Phone: [phone]  Fax: [phone]  Return to Endocrinology clinic as below: Future Appointments  Date Time Provider Department Center  05/29/2021  7:30 AM GI-BCG DX DEXA 1 GI-BCGDG GI-BREAST CE  06/14/2021  3:30 PM [facility] SF-CCM CARE [facility]  06/23/2021  8:20 AM Jodelle Gross, NP CVD[facility]TH[facility]  06/28/2021  8:30 AM [facility]-SV CCM PHARMACIST [facility]  07/04/2021  9:00 AM Waymon Budge, MD LBP[facility]-PULCARE None  08/14/2021  9:45 AM Helane Gunther, DPM [facility]  10/24/2021  8:30 AM Sheliah Hatch, MD [facility]

## 2021-05-22 NOTE — Patient Instructions (Addendum)
-   Stop Metformin  - Increase Glipizide 5 mg, 1.5 tablets Before breakfast and 1.5 tablets before supper  - Continue Farxiga 10 mg, 1 tablet daily with Breakfast     - HOW TO TREAT LOW BLOOD SUGARS (Blood sugar LESS THAN 70 MG/DL) Please follow the RULE OF 15 for the treatment of hypoglycemia treatment (when your (blood sugars are less than 70 mg/dL)   STEP 1: Take 15 grams of carbohydrates when your blood sugar is low, which includes:  3-4 GLUCOSE TABS  OR 3-4 OZ OF JUICE OR REGULAR SODA OR ONE TUBE OF GLUCOSE GEL    STEP 2: RECHECK blood sugar in 15 MINUTES STEP 3: If your blood sugar is still low at the 15 minute recheck --> then, go back to STEP 1 and treat AGAIN with another 15 grams of carbohydrates

## 2021-05-25 ENCOUNTER — Encounter: Payer: Self-pay | Admitting: Internal Medicine

## 2021-05-25 ENCOUNTER — Other Ambulatory Visit: Payer: Self-pay

## 2021-05-25 MED ORDER — GLIPIZIDE 5 MG PO TABS: ORAL_TABLET | ORAL | 3 refills | Status: DC

## 2021-05-25 NOTE — Telephone Encounter (Signed)
Patient has been advise to take 2 tabs at breakfast and 2 tabs and supper on Glipizide. Script has been updated to reflect changes.

## 2021-05-28 IMAGING — MG DIGITAL SCREENING BILATERAL MAMMOGRAM WITH TOMO AND CAD
8 series · 8 of 24 positions shown · non-contrast
Comparison: Previous exam(s).

CLINICAL DATA: Screening.

EXAM:
DIGITAL SCREENING BILATERAL MAMMOGRAM WITH TOMO AND CAD

[R MLO synth-2D]
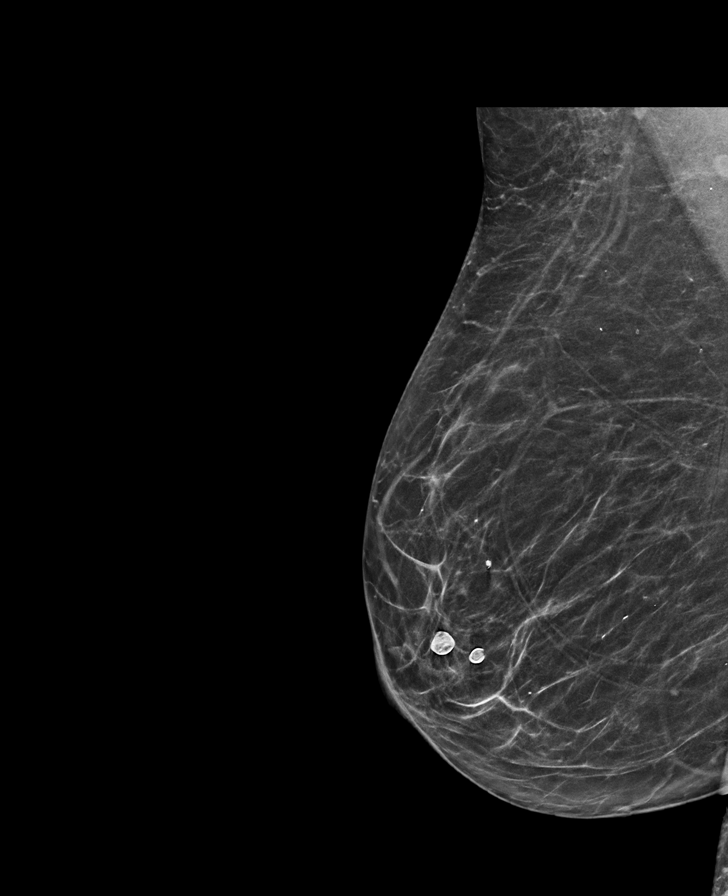

[L MLO synth-2D]
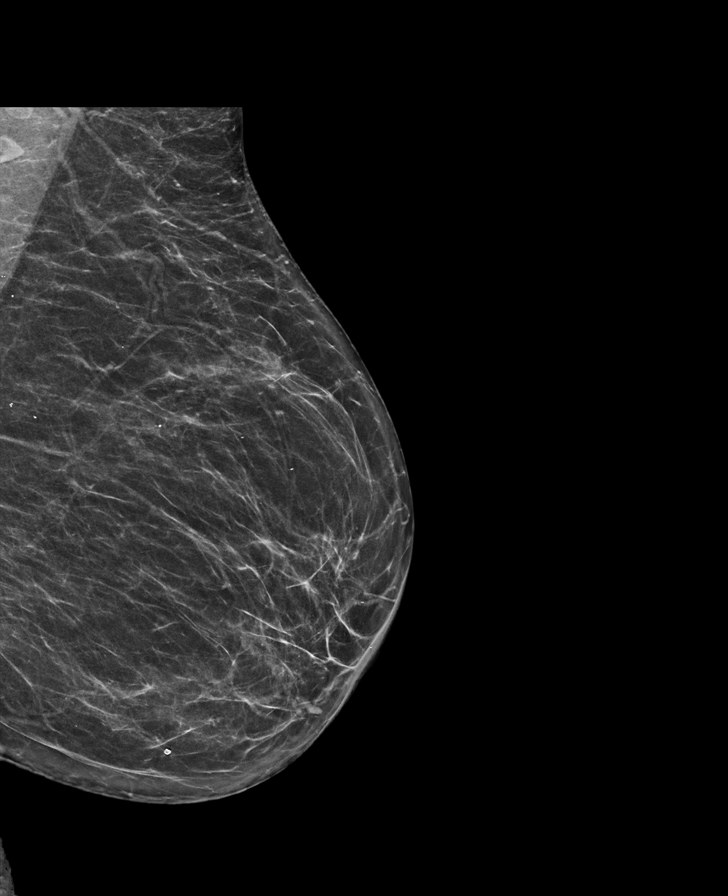

[L CC synth-2D]
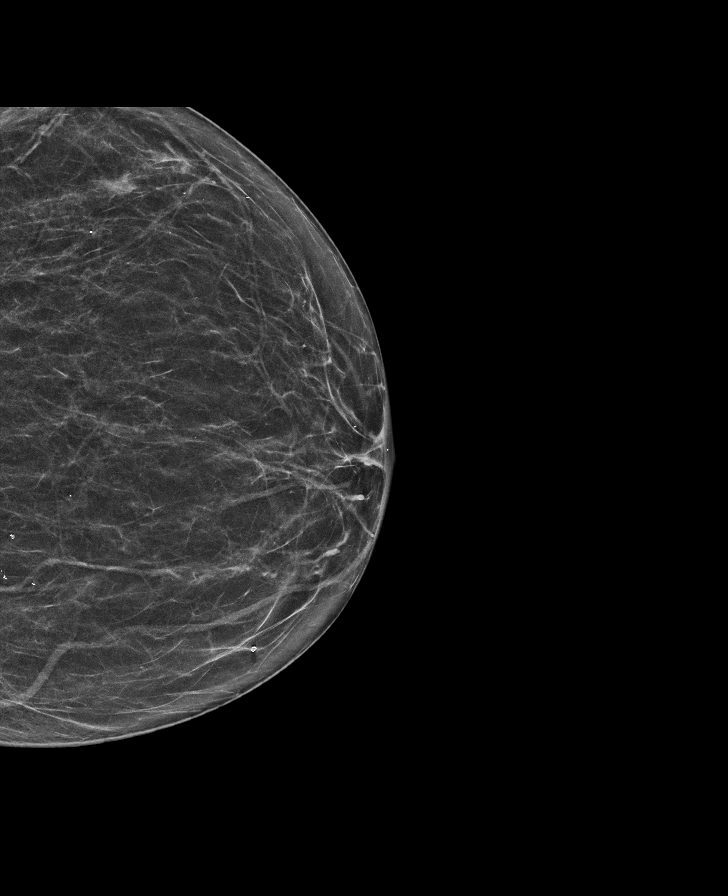

[R CC synth-2D]
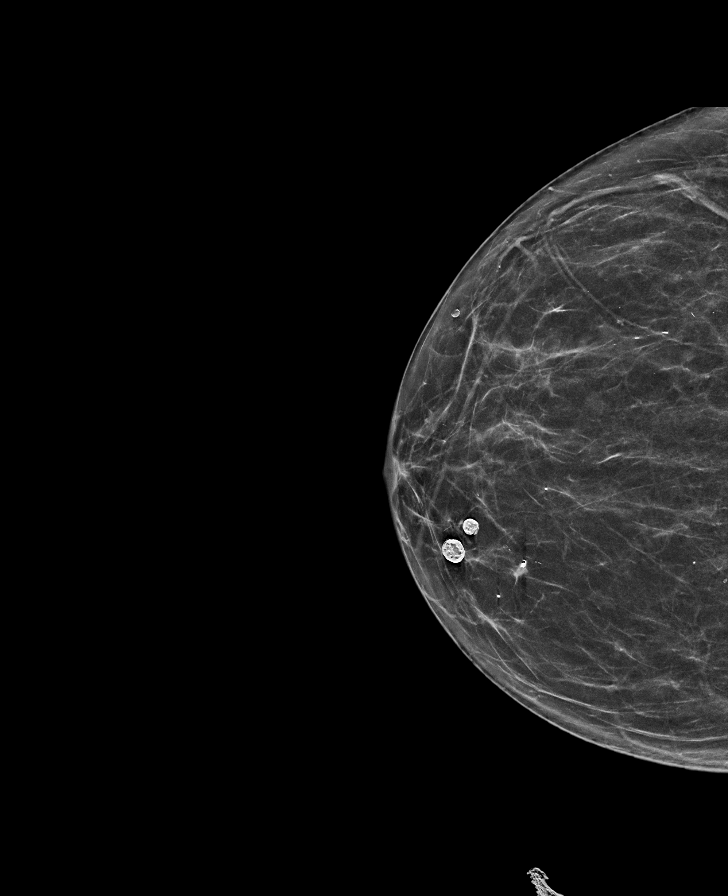

[L CC tomo · tomo slice 31/62.0]
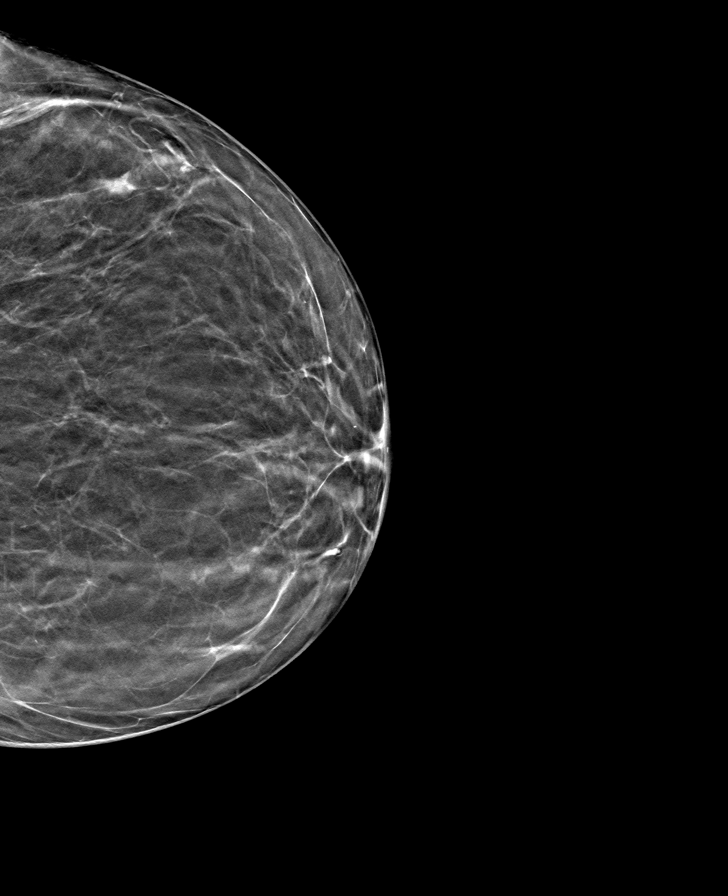

[R MLO tomo · tomo slice 36/71.0]
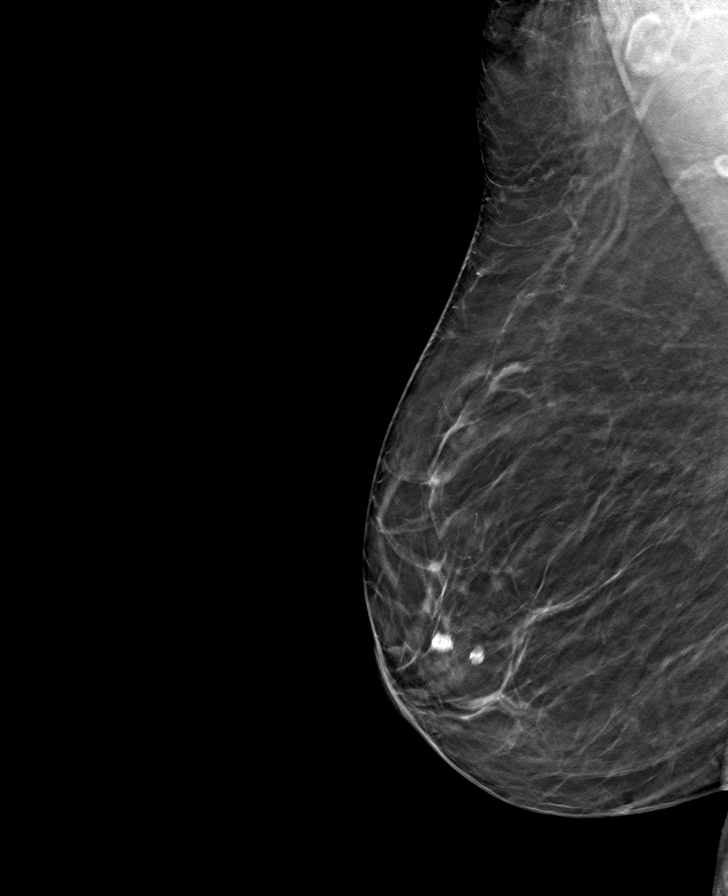

[L MLO tomo · tomo slice 35/70.0]
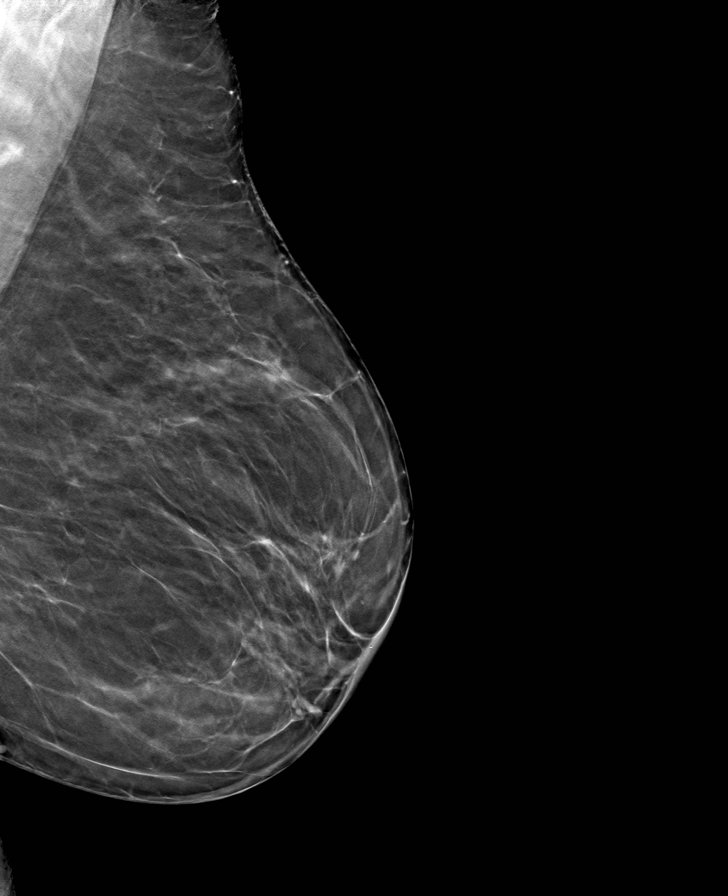

[R CC tomo · tomo slice 31/61.0]
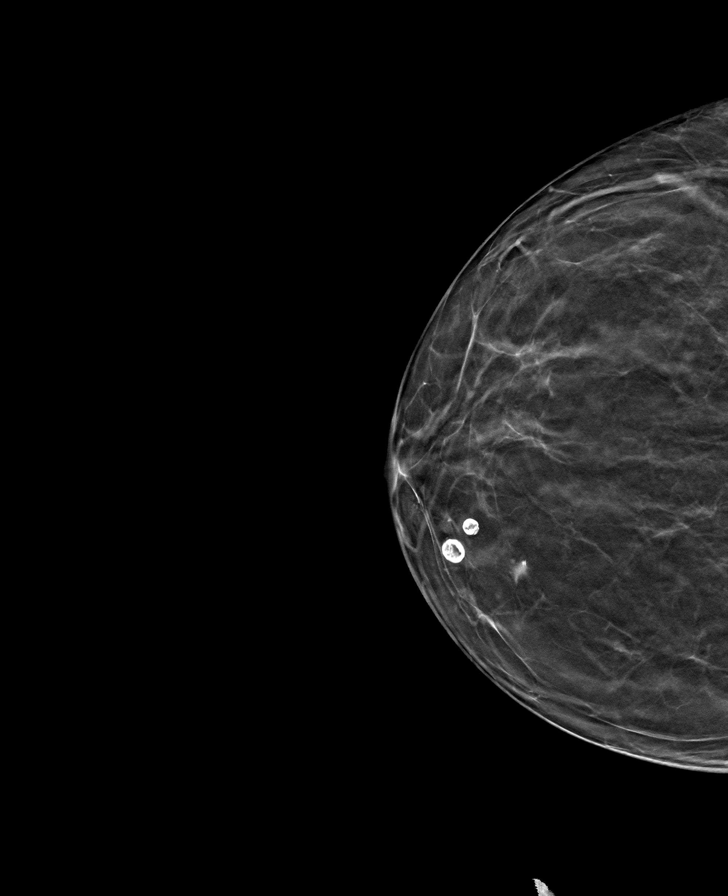

[8 of 24 positions shown; findings below may reference images not displayed]

ACR Breast Density Category b: There are scattered areas of
fibroglandular density.
FINDINGS: There are no findings suspicious for malignancy. Images were
processed with CAD.
IMPRESSION: No mammographic evidence of malignancy. A result letter of this
screening mammogram will be mailed directly to the patient.

RECOMMENDATION:
Screening mammogram in one year. (Code:[id number])

BI-RADS CATEGORY  1: Negative.

## 2021-05-29 ENCOUNTER — Ambulatory Visit
Admission: RE | Admit: 2021-05-29 | Discharge: 2021-05-29 | Disposition: A | Discharge status: Home / Self Care | Payer: Medicare Other | Source: Ambulatory Visit | Attending: Family Medicine | Admitting: Family Medicine

## 2021-05-29 ENCOUNTER — Encounter: Payer: Self-pay | Admitting: Internal Medicine

## 2021-05-29 DIAGNOSIS — Z780 Asymptomatic menopausal state: Secondary | ICD-10-CM | POA: Diagnosis not present

## 2021-05-29 DIAGNOSIS — M85852 Other specified disorders of bone density and structure, left thigh: Secondary | ICD-10-CM

## 2021-05-29 DIAGNOSIS — M8580 Other specified disorders of bone density and structure, unspecified site: Secondary | ICD-10-CM

## 2021-05-30 ENCOUNTER — Telehealth: Payer: Self-pay

## 2021-05-30 NOTE — Telephone Encounter (Signed)
-----   Message from Sheliah Hatch, MD sent at 05/29/2021  9:52 AM EST ----- Your bone density shows osteopenia- low bone mass but not as bad as osteoporosis.  Please make sure you are taking daily Calcium and Vit D supplements.

## 2021-05-30 NOTE — Telephone Encounter (Signed)
Patient is aware 

## 2021-05-31 ENCOUNTER — Telehealth: Payer: Medicare Other

## 2021-05-31 DIAGNOSIS — N1831 Chronic kidney disease, stage 3a: Secondary | ICD-10-CM | POA: Diagnosis not present

## 2021-05-31 DIAGNOSIS — E1122 Type 2 diabetes mellitus with diabetic chronic kidney disease: Secondary | ICD-10-CM | POA: Diagnosis not present

## 2021-05-31 DIAGNOSIS — N179 Acute kidney failure, unspecified: Secondary | ICD-10-CM | POA: Diagnosis not present

## 2021-05-31 DIAGNOSIS — I5032 Chronic diastolic (congestive) heart failure: Secondary | ICD-10-CM | POA: Diagnosis not present

## 2021-05-31 DIAGNOSIS — I129 Hypertensive chronic kidney disease with stage 1 through stage 4 chronic kidney disease, or unspecified chronic kidney disease: Secondary | ICD-10-CM | POA: Diagnosis not present

## 2021-06-08 ENCOUNTER — Telehealth (INDEPENDENT_AMBULATORY_CARE_PROVIDER_SITE_OTHER): Payer: Medicare Other | Admitting: Family Medicine

## 2021-06-08 ENCOUNTER — Encounter: Payer: Self-pay | Admitting: Family Medicine

## 2021-06-08 DIAGNOSIS — N1832 Chronic kidney disease, stage 3b: Secondary | ICD-10-CM | POA: Diagnosis not present

## 2021-06-08 DIAGNOSIS — E1122 Type 2 diabetes mellitus with diabetic chronic kidney disease: Secondary | ICD-10-CM

## 2021-06-08 MED ORDER — SITAGLIPTIN PHOSPHATE 50 MG PO TABS: 50.0000 mg | ORAL_TABLET | Freq: Every day | ORAL | 3 refills | Status: DC

## 2021-06-08 NOTE — Telephone Encounter (Signed)
Pt reports januvia too high in price and is requesting an alternative. Please advise

## 2021-06-08 NOTE — Progress Notes (Signed)
Virtual Visit via Video   I connected with patient on 06/08/21 at 11:30 AM EST by a video enabled telemedicine application and verified that I am speaking with the correct person using two identifiers.  Location patient: Home Location provider: [place], Office Persons participating in the virtual visit: Patient, Provider, [place] C)  I discussed the limitations of evaluation and management by telemedicine and the availability of in person appointments. The patient expressed understanding and agreed to proceed.  Subjective:   HPI:   DM- 'i'm having a problem w/ my sugar and my diabetes doctor.  We're at an impasse'  Took pt off Metformin due to renal function.  Increased Glipizide to 60m BID.  Sugars 'have been all out of whack'.  Most recent A1C 7.8%.  Pt reached out to Endo to voice concerns about her elevated sugars and 'she blew me off'.  CBG this AM-259.  Yesterday- 184, 221.  12/13- 258, 230.  Monday after lunch was 445.  Pt reports she has been counting carbs- trying to eat 45 carbs or less at each meal.  Pt reports increased thirst, has to drink water 'all through the night'.  ROS:   See pertinent positives and negatives per HPI.  Patient Active Problem List   Diagnosis Date Noted   Reactive airway disease 01/30/2021   Morbid obesity ([place] 11/18/2020   Osteopenia 11/18/2020   Type 2 diabetes mellitus with stage 3a chronic kidney disease, without long-term current use of insulin ([place] 07/21/2020   Type 2 diabetes mellitus with hyperglycemia, with long-term current use of insulin ([place] 04/19/2020   Lumbar radiculopathy 03/02/2020   Left hip pain 12/01/2019   Type 2 diabetes mellitus with stage 3b chronic kidney disease, without long-term current use of insulin ([place] 157/32/2025  Diastolic CHF ([place] 042/70/6237  Dyspnea 03/08/2019   Pulmonary edema 03/04/2019   Physical exam 02/06/2017   Anxiety and depression 12/05/2016   Obesity (BMI 30-39.9) 03/01/2016    OSA (obstructive sleep apnea) 07/11/2015   Pulmonary hypertension ([place] 05/06/2015   Heart murmur 03/03/2015   Essential hypertension, benign 03/18/2014   Hyperlipidemia 03/18/2014   Post-menopause on HRT (hormone replacement therapy) 03/18/2014    Social History   Tobacco Use   Smoking status: Never   Smokeless tobacco: Never  Substance Use Topics   Alcohol use: No    Alcohol/week: 0.0 standard drinks    Current Outpatient Medications:    albuterol (VENTOLIN HFA) 108 (90 Base) MCG/ACT inhaler, Inhale 1-2 puffs into the lungs every 6 (six) hours as needed for wheezing or shortness of breath., Disp: , Rfl:    Alcohol Swabs (ALCOHOL PREP) PADS, Pt uses an alcohol pad each time sugars are tested. Pt tests twice daily. Dx E11.9, Disp: 100 each, Rfl: 3   amLODipine (NORVASC) 10 MG tablet, TAKE 1 TABLET BY MOUTH EVERY DAY, Disp: 90 tablet, Rfl: 1   atorvastatin (LIPITOR) 40 MG tablet, TAKE 1 TABLET BY MOUTH EVERY DAY, Disp: 90 tablet, Rfl: 1   B Complex Vitamins (B COMPLEX PO), Take 1 tablet by mouth daily., Disp: , Rfl:    buPROPion (WELLBUTRIN XL) 150 MG 24 hr tablet, TAKE 1 TABLET BY MOUTH EVERY DAY, Disp: 90 tablet, Rfl: 2   Calcium Carb-Cholecalciferol [id number] MG-UNIT CAPS, Take 1 tablet by mouth 2 (two) times daily., Disp: , Rfl:    carvedilol (COREG) 25 MG tablet, TAKE 1 TABLET BY MOUTH TWICE A DAY, Disp: 180 tablet, Rfl: 3   dapagliflozin propanediol (FARXIGA)  10 MG TABS tablet, Take 10 mg by mouth daily., Disp: , Rfl:    fish oil-omega-3 fatty acids 1000 MG capsule, Take 1 g by mouth daily., Disp: , Rfl:    fluticasone (FLONASE) 50 MCG/ACT nasal spray, SPRAY 2 SPRAYS INTO EACH NOSTRIL EVERY DAY (Patient taking differently: Place 2 sprays into both nostrils daily.), Disp: 18 g, Rfl: 1   furosemide (LASIX) 20 MG tablet, TAKE 1 TABLET BY MOUTH EVERY DAY, Disp: 90 tablet, Rfl: 1   gabapentin (NEURONTIN) 100 MG capsule, Take 100 mg by mouth 2 (two) times daily., Disp: , Rfl:     glipiZIDE (GLUCOTROL) 5 MG tablet, Take 2 tabs at breakfast and 2 tabs at supper., Disp: 360 tablet, Rfl: 3   glucose blood (FREESTYLE LITE) test strip, USE TO TEST BLOOD GLUCOSE 2 TIMES DAILY. Dx E11.9, Disp: 100 each, Rfl: 12   loratadine (CLARITIN) 10 MG tablet, TAKE 1 TABLET BY MOUTH EVERY DAY (Patient taking differently: Take 10 mg by mouth daily.), Disp: 30 tablet, Rfl: 11   Melatonin 3 MG TABS, Take 10 mg by mouth at bedtime. , Disp: , Rfl:    methocarbamol (ROBAXIN) 500 MG tablet, Take 500 mg by mouth 2 (two) times daily., Disp: , Rfl:    Multiple Vitamins-Minerals (VISION FORMULA) TABS, Take 1 tablet by mouth daily., Disp: , Rfl:    OVER THE COUNTER MEDICATION, Take 1 tablet by mouth daily. Equate Vision Formula 50+, Disp: , Rfl:    prednisoLONE acetate (PRED FORTE) 1 % ophthalmic suspension, SMARTSIG:In Eye(s), Disp: , Rfl:    sertraline (ZOLOFT) 50 MG tablet, TAKE 1 TABLET BY MOUTH EVERY DAY, Disp: 90 tablet, Rfl: 1   traMADol (ULTRAM) 50 MG tablet, Take 1 tablet (50 mg total) by mouth every 6 (six) hours as needed., Disp: 30 tablet, Rfl: 0   TRUEPLUS LANCETS 30G MISC, Use one lancet each time sugars are tested. Pt tests twice daily. Dx. E11.9, Disp: 100 each, Rfl: 3   UNABLE TO FIND, Med Name: Healthwise talking meter, Disp: 1 kit, Rfl: 0  No Known Allergies  Objective:   LMP 06/25/2008  AAOx3, NAD, obese NCAT, EOMI No obvious CN deficits Coloring WNL Pt is able to speak clearly, coherently without shortness of breath or increased work of breathing.  Thought process is linear.  Mood is appropriate.   Assessment and Plan:   DM- deteriorated.  Pt's sugars have been quite high.  She feels that her Endocrinologist was dismissive in her concerns.  Asking for additional medication to improve sugars while she establishes w/ a new endo.  Given her renal fxn, will start Januvia 69m daily in addition to [place] Glipizide.  Pt expressed understanding and is in agreement w/ plan.     KAnnye Asa MD 06/08/2021

## 2021-06-09 ENCOUNTER — Telehealth: Payer: Medicare Other | Admitting: Family Medicine

## 2021-06-12 MED ORDER — LINAGLIPTIN 5 MG PO TABS: 5.0000 mg | ORAL_TABLET | Freq: Every day | ORAL | 3 refills | Status: DC

## 2021-06-14 ENCOUNTER — Ambulatory Visit (INDEPENDENT_AMBULATORY_CARE_PROVIDER_SITE_OTHER): Payer: Medicare Other

## 2021-06-14 ENCOUNTER — Other Ambulatory Visit: Payer: Self-pay | Admitting: Nephrology

## 2021-06-14 DIAGNOSIS — E1122 Type 2 diabetes mellitus with diabetic chronic kidney disease: Secondary | ICD-10-CM

## 2021-06-14 DIAGNOSIS — E785 Hyperlipidemia, unspecified: Secondary | ICD-10-CM

## 2021-06-14 DIAGNOSIS — N1831 Chronic kidney disease, stage 3a: Secondary | ICD-10-CM

## 2021-06-14 DIAGNOSIS — I10 Essential (primary) hypertension: Secondary | ICD-10-CM

## 2021-06-14 DIAGNOSIS — I5032 Chronic diastolic (congestive) heart failure: Secondary | ICD-10-CM

## 2021-06-14 NOTE — Patient Instructions (Signed)
Visit Information  Thank you for taking time to visit with me today. Please don't hesitate to contact me if I can be of assistance to you before our next scheduled telephone appointment.  Following are the goals we discussed today:  Take all medications as prescribed Attend all scheduled provider appointments Call pharmacy for medication refills 3-7 days in advance of running out of medications Call provider office for new concerns or questions  call office if I gain more than 2 pounds in one day or 5 pounds in one week keep legs up while sitting use salt in moderation weigh myself daily follow rescue plan if symptoms flare-up eat more whole grains, fruits and vegetables, lean meats and healthy fats keep appointment with eye doctor check blood sugar at prescribed times: twice daily and when you have symptoms of low or high blood sugar check feet daily for cuts, sores or redness drink 6 to 8 glasses of water each day fill half of plate with vegetables manage portion size switch to low-fat or skim milk switch to sugar-free drinks wear comfortable, cotton socks check blood pressure daily choose a place to take my blood pressure (home, clinic or office, retail store) take blood pressure log to all doctor appointments take medications for blood pressure exactly as prescribed eat more whole grains, fruits and vegetables, lean meats and healthy fats call for medicine refill 2 or 3 days before it runs out take all medications exactly as prescribed call doctor with any symptoms you believe are related to your medicine  Our next appointment is by telephone on 07/19/21 at 3 PM  Please call the care guide team at [phone] if you need to cancel or reschedule your appointment.   If you are experiencing a Mental Health or Behavioral Health Crisis or need someone to talk to, please call the Suicide and Crisis Lifeline: 988 call the [place] National Suicide Prevention Lifeline: 770-347-4906 or  TTY: 909-191-6981 TTY 754-453-0936) to talk to a trained counselor call 1-800-273-TALK (toll free, 24 hour hotline) go to [facility] [address], [place] [phone]) call 911   Patient verbalizes understanding of instructions provided today and agrees to view in MyChart.  Dudley Major RN, BSN,CCM, CDE Care Management Coordinator [facility] [phone], Mobile [phone]

## 2021-06-14 NOTE — Chronic Care Management (AMB) (Signed)
Chronic Care Management   CCM RN Visit Note  06/14/2021 Name: Kayla Brown MRN: [id number] DOB: 1950/11/30  Subjective: Kayla Brown is a 70 y.o. year old female who is a primary care patient of Birdie Riddle, [name], MD. The care management team was consulted for assistance with disease management and care coordination needs.    Engaged with patient by telephone for follow up visit in response to provider referral for case management and/or care coordination services.   Consent to Services:  The patient was given information about Chronic Care Management services, agreed to services, and gave verbal consent prior to initiation of services.  Please see initial visit note for detailed documentation.   Patient agreed to services and verbal consent obtained.   Assessment: Review of patient past medical history, allergies, medications, health status, including review of consultants reports, laboratory and other test data, was performed as part of comprehensive evaluation and provision of chronic care management services.   SDOH (Social Determinants of Health) assessments and interventions performed:    CCM Care Plan  No Known Allergies  Outpatient Encounter Medications as of 06/14/2021  Medication Sig Note   albuterol (VENTOLIN HFA) 108 (90 Base) MCG/ACT inhaler Inhale 1-2 puffs into the lungs every 6 (six) hours as needed for wheezing or shortness of breath.    Alcohol Swabs (ALCOHOL PREP) PADS Pt uses an alcohol pad each time sugars are tested. Pt tests twice daily. Dx E11.9    amLODipine (NORVASC) 10 MG tablet TAKE 1 TABLET BY MOUTH EVERY DAY    atorvastatin (LIPITOR) 40 MG tablet TAKE 1 TABLET BY MOUTH EVERY DAY    B Complex Vitamins (B COMPLEX PO) Take 1 tablet by mouth daily.    buPROPion (WELLBUTRIN XL) 150 MG 24 hr tablet TAKE 1 TABLET BY MOUTH EVERY DAY    Calcium Carb-Cholecalciferol [phone] MG-UNIT CAPS Take 1 tablet by mouth 2 (two) times daily.    carvedilol (COREG) 25  MG tablet TAKE 1 TABLET BY MOUTH TWICE A DAY    dapagliflozin propanediol (FARXIGA) 10 MG TABS tablet Take 10 mg by mouth daily. 01/25/2021: Gets through patient assistance.    fish oil-omega-3 fatty acids 1000 MG capsule Take 1 g by mouth daily.    fluticasone (FLONASE) 50 MCG/ACT nasal spray SPRAY 2 SPRAYS INTO EACH NOSTRIL EVERY DAY (Patient taking differently: Place 2 sprays into both nostrils daily.)    furosemide (LASIX) 20 MG tablet TAKE 1 TABLET BY MOUTH EVERY DAY    gabapentin (NEURONTIN) 100 MG capsule Take 100 mg by mouth 2 (two) times daily.    glipiZIDE (GLUCOTROL) 5 MG tablet Take 2 tabs at breakfast and 2 tabs at supper.    glucose blood (FREESTYLE LITE) test strip USE TO TEST BLOOD GLUCOSE 2 TIMES DAILY. Dx E11.9    linagliptin (TRADJENTA) 5 MG TABS tablet Take 1 tablet (5 mg total) by mouth daily.    loratadine (CLARITIN) 10 MG tablet TAKE 1 TABLET BY MOUTH EVERY DAY (Patient taking differently: Take 10 mg by mouth daily.)    Melatonin 3 MG TABS Take 10 mg by mouth at bedtime.     methocarbamol (ROBAXIN) 500 MG tablet Take 500 mg by mouth 2 (two) times daily.    Multiple Vitamins-Minerals (VISION FORMULA) TABS Take 1 tablet by mouth daily.    OVER THE COUNTER MEDICATION Take 1 tablet by mouth daily. Equate Vision Formula 50+    prednisoLONE acetate (PRED FORTE) 1 % ophthalmic suspension SMARTSIG:In Eye(s)  sertraline (ZOLOFT) 50 MG tablet TAKE 1 TABLET BY MOUTH EVERY DAY    traMADol (ULTRAM) 50 MG tablet Take 1 tablet (50 mg total) by mouth every 6 (six) hours as needed.    TRUEPLUS LANCETS 30G MISC Use one lancet each time sugars are tested. Pt tests twice daily. Dx. E11.9    UNABLE TO FIND Med Name: Healthwise talking meter    No facility-administered encounter medications on file as of 06/14/2021.    Patient Active Problem List   Diagnosis Date Noted   Reactive airway disease 01/30/2021   Morbid obesity ([place]) 11/18/2020   Osteopenia 11/18/2020   Type 2 diabetes  mellitus with stage 3a chronic kidney disease, without long-term current use of insulin ([place]) 07/21/2020   Type 2 diabetes mellitus with hyperglycemia, with long-term current use of insulin ([place]) 04/19/2020   Lumbar radiculopathy 03/02/2020   Left hip pain 12/01/2019   Type 2 diabetes mellitus with stage 3b chronic kidney disease, without long-term current use of insulin ([place]) 75/17/0017   Diastolic CHF ([place]) 49/44/9675   Dyspnea 03/08/2019   Pulmonary edema 03/04/2019   Physical exam 02/06/2017   Anxiety and depression 12/05/2016   Obesity (BMI 30-39.9) 03/01/2016   OSA (obstructive sleep apnea) 07/11/2015   Pulmonary hypertension ([place]) 05/06/2015   Heart murmur 03/03/2015   Essential hypertension, benign 03/18/2014   Hyperlipidemia 03/18/2014   Post-menopause on HRT (hormone replacement therapy) 03/18/2014    Conditions to be addressed/monitored:CHF, HTN, HLD, DMII, and CKD Stage 3  Care Plan : RNCM:Chronic Kidney (Adult)  Updates made by [name], RN since 06/14/2021 12:00 AM  Completed 06/14/2021   Problem: RNCM: Need for long term self management plan for chronic kidney disease Resolved 06/14/2021  Priority: Medium     Long-Range Goal: RNCM : Disease Progression Prevented or Minimized Completed 06/14/2021  Start Date: 08/10/2020  Expected End Date: 09/23/2021  Recent Progress: On track  Priority: Medium  Note:   Resolving due to duplicate goal  Current Barriers:  Knowledge Deficits related to Chronic kidney disease stage 3  now being followed by nephrology  Does not adhere to provider recommendations re: diet and fluids recommendations at times-reports avoiding dark sodas and drinking water instead States she will see Dr.Upton again in 8 months. reports B/P readings 120130/60-80.  States she is picking up her [place]ade at Dr. Bishop [place] office with out any issues Nurse Case Manager Clinical Goal(s):  patient will verbalize understanding of plan for self management of  chronic kidney disease patient will work with CCM team and primary care provider to address needs related to chronic kidney disease patient will attend all scheduled medical appointments: CCM PharmD 14/23,Dr. Birdie Riddle 04/26/21, Dr. Kelton Pillar 05/22/21  Dr.Upton March 2023 patient will verbalize basic understanding of chronic kidney disease process and self health management plan as evidenced by no worsening of kidney function, adherence to diet and fluid recommendations  patient will work with CM team pharmacist to self manage medications the patient will demonstrate ongoing self health care management ability as evidenced by adherence to plan of care, medications, diet and provider appts, call providers with problems or concerns*  Interventions:  1:1 collaboration with [name], MD regarding development and update of comprehensive plan of care as evidenced by provider attestation and co-signature Inter-disciplinary care team collaboration (see longitudinal plan of care) Evaluation of current treatment plan related to chronic kidney disease and patient's adherence to plan as established by provider. Reinforced education to patient re: chronic kidney disease self management  Reinforced importance of keeping blood sugar and B/P under control to help prevent further kidney damage Reviewed medications with patient and discussed to call CCM team for any problems or concerns Reviewed scheduled/upcoming provider appointments including: CCM Pharm D 06/28/21, Dr. Birdie Riddle 04/26/21, Dr. Kelton Pillar 05/22/21, [facility] March 2023,  Discussed plans with patient for ongoing care management follow up and provided patient with direct contact information for care management team Reinforced to follow dietary recommendations of nephrology  Patient Goals/Self-Care Activities  - Patient will self administer medications as prescribed Patient will attend all scheduled provider appointments Patient will call  pharmacy for medication refills Patient will call provider office for new concerns or questions -try walking 3-4 times a week and gradually increase time and number of days  -follow diet and fluid recommendations from kidney doctor Follow Up Plan: Telephone follow up appointment with care management team member scheduled for: 06/14/21 at 3:30 PM The patient has been provided with contact information for the care management team and has been advised to call with any health related questions or concerns.          Care Plan : VVKP:QAESL Failure (Adult)  Updates made by [name], RN since 06/14/2021 12:00 AM  Completed 06/14/2021   Problem: RNCM: Knowledge deficit related to long term self management of heart failure Resolved 06/14/2021  Priority: High     Long-Range Goal: Effective self mangement of heart failure Completed 06/14/2021  Start Date: 08/10/2020  Expected End Date: 04/24/2021  Recent Progress: On track  Priority: High  Note:   Resolving due to duplicate goal  Current Barriers:  Does not adhere to provider recommendations re: diet and fluid recommendations at times Reports weighing daily with weight stable at 178 lb this morning. States her weights range from 174-178. Reports she has not needed to take extra Lasix since the end of May.  Denies any swelling, shortness of breath or chest pains, Reports trying to follow a low sodium diet.  States she tries to walk 3 times a week for 10-15 minutes Case Manager Clinical Goal(s):  patient will weigh self daily and record patient will verbalize understanding of Heart Failure Action Plan and when to call doctor patient will take all Heart Failure mediations as prescribed patient will weigh daily and record (notifying MD of 3 lb weight gain over night or 5 lb in a week) Interventions:  Collaboration with [name], MD regarding development and update of comprehensive plan of care as evidenced by provider  attestation and co-signature Inter-disciplinary care team collaboration (see longitudinal plan of care) Reviewed upcoming appointment with cardiology 06/23/21 Basic overview and discussion of pathophysiology of Heart Failure reviewed  Reinforced verbal education on low sodium diet Reinforced Heart Failure Action Plan   Reinforced placing scale on hard, flat surface Discussed importance of daily weight and advised patient to weigh and record daily Reinforced role of diuretics in prevention of fluid overload and management of heart failure Reinforced importance of using CPAP for sleep apnea to help with her heart and B/P Reviewed importance of regular exercise and to avoid weight gain to help with B/P, CBGs and weight Patient Goals/Self-Care Activities - begin a heart failure diary - bring diary to all appointments - develop a rescue plan - eat more whole grains, fruits and vegetables, lean meats and healthy fats - follow rescue plan if symptoms flare-up - know when to call the doctor - track symptoms and what helps feel better or worse Follow Up Plan:  Telephone follow up appointment with care management team member scheduled for: 06/14/21 at 3:30 PM The patient has been provided with contact information for the care management team and has been advised to call with any health related questions or concerns.      Care Plan : RNCM:Diabetes Type 2 (Adult)  Updates made by [name], RN since 06/14/2021 12:00 AM  Completed 06/14/2021   Problem: RNCM: Need for effective self management of Type 2 Diabetes Resolved 06/14/2021  Priority: Medium     Long-Range Goal: RNCM:Effective self mangement of Type 2 Diabetes Completed 06/14/2021  Start Date: 08/10/2020  Expected End Date: 04/24/2021  Recent Progress: On track  Priority: Medium  Note:   Resolving due to duplicate goal  Objective:  Lab Results  Component Value Date   HGBA1C 6.7 (A) 07/21/2020   Lab Results  Component  Value Date   CREATININE 1.26 (H) 07/21/2020   CREATININE 1.24 (H) 05/23/2020   CREATININE 1.08 11/19/2019   No results found for: EGFR Current Barriers:  Knowledge Deficits related to basic Diabetes pathophysiology and self care/management Difficulty obtaining or cannot afford medications Does not adhere to provider recommendations re: ADA/low sodium diet and exercise at times Reports currently getting patient assistance for Farxiga, states she is now picking up at Dr. Bishop [place] office.  States her   CBGs range 110-190 fasting and around 130 later in the day. States she was feeling sick the day she had the 190 reading but it was better the next day. Denies any hypoglycemia, States she and her husband have been working harder to eat healthier with more vegetables. States she is  walking 3 times a week weather permitting and doing exercises that PT taught her several times a week to help with her chronic low back pain.  States her back pain is starting to come back and she goes to see her back doctor in December Case Manager Clinical Goal(s):  patient will demonstrate improved adherence to prescribed treatment plan for diabetes self care/management as evidenced by: daily monitoring and recording of CBG  adherence to ADA/ carb modified diet exercise 3-4 days/week adherence to prescribed medication regimen contacting provider for new or worsened symptoms or questions Interventions:  Collaboration with [name], MD regarding development and update of comprehensive plan of care as evidenced by provider attestation and co-signature Inter-disciplinary care team collaboration (see longitudinal plan of care)  Reinforced education about basic DM disease process Reviewed medications with patient and discussed importance of medication adherence Discussed plans with patient for ongoing care management follow up and provided patient with direct contact information for care management team Reinforced s/sx  of hypoglycemia and hyperglycemia and importance of correct treatment Reviewed scheduled/upcoming provider appointments including: CCM PharmD 06/28/21, Dr. Birdie Riddle 04/26/21, Dr. Kelton Pillar 05/22/21, Dr. Hollie Salk March 2023, cardiology 06/23/21 Reinforced to check cbg daily and record, calling primary care provider for findings outside established parameters.   Review of patient status, including review of consultants reports, relevant laboratory and other test results, and medications completed. Reinforced fasting blood sugar goals of 80-130 and less than 180 1 1/2-2 hours after meals Reinforced to follow a low carbohydrate low salt diet and to watch portion sizes Reinforced to increase activity as tolerated Patient Goals/Self-Care Activities - Self administers oral medications as prescribed Attends all scheduled provider appointments Checks blood sugars as prescribed and utilize hyper and hypoglycemia protocol as needed Adheres to prescribed ADA/carb modified - check blood sugar at prescribed times - check blood sugar if  I feel it is too high or too low - enter blood sugar readings and medication or insulin into daily log - take the blood sugar log to all doctor visits try walking 3-4 times a week and gradually increase time and number of days  -follow diet and fluid recommendations from kidney doctor Follow Up Plan: Telephone follow up appointment with care management team member scheduled for: 06/14/21 at 3:30 PM The patient has been provided with contact information for the care management team and has been advised to call with any health related questions or concerns.      Care Plan : RN Care Manager Plan of Care  Updates made by [name], RN since 06/14/2021 12:00 AM     Problem: Chronic Disease Management and Care Coordination Needs (DM, CHF, CKD,HTN and HLD)   Priority: High     Long-Range Goal: Establish Plan of Care for Chronic Disease Management Needs (DM, CHF, CKD,HTN and  HLD)   Start Date: 06/14/2021  Expected End Date: 06/11/2022  Priority: High  Note:   Current Barriers:  Knowledge Deficits related to plan of care for management of CHF, HTN, HLD, DMII, and CKD Stage 3  Care Coordination needs related to Financial constraints related to cost of medications Chronic Disease Management support and education needs related to CHF, HTN, HLD, DMII, and CKD Stage 3  States that she has been having issues with her CBG being high since her Metformin was stopped due to her kidney function.  States she is taking 10 mg of glipizide twice a day and she is waiting to find out if the medication that Dr.[name] ordered her is covered by her insurance.  States she is going to call her insurance company after the holidays to find out which drugs are preferred.  States her CBGs have ranged from high 200's to 400.  States she has been limiting her CHO to 30-45 GM a meal but she gets hungry. States she does not drink anything with sugar States she would like to find another endocrinologist after the first of year.  States she saw the nephrologist 2 wks ago and she is to have an renal ultrasound.  States her weight has been stable and she has not needed to take any extra Lasix.  Denies any swelling, chest pains or shortness of breath.  States hr B/P has been stable.  States her back pain has been good.  States she has not been walking as much due to the weather and being busy getting ready for the holidays  RNCM Clinical Goal(s):  Patient will verbalize understanding of plan for management of CHF, HTN, HLD, DMII, and CKD Stage 3 as evidenced by voiced adherence to plan of care verbalize basic understanding of  CHF, HTN, HLD, DMII, and CKD Stage 3 disease process and self health management plan as evidenced by voiced understanding and teach back take all medications exactly as prescribed and will call provider for medication related questions as evidenced by dispense report and pt  verbalization attend all scheduled medical appointments: CCM PharmD 06/28/21,cardiology 06/23/21, Pulmonary 07/04/21, Dr. Birdie Riddle 10/24/21 as evidenced by medical records demonstrate Improved adherence to prescribed treatment plan for CHF, HTN, HLD, DMII, and CKD Stage 3 as evidenced by readings within limits, voiced adherence to plan of care continue to work with RN Care Manager to address care management and care coordination needs related to  CHF, HTN, HLD, DMII, and CKD Stage 3 as evidenced by adherence to CM Team Scheduled  appointments work with pharmacist to address Financial constraints related to cost of medications related toCHF, HTN, HLD, DMII, and CKD Stage 3 as evidenced by review or EMR and patient or pharmacist report through collaboration with RN Care manager, provider, and care team.   Interventions: 1:1 collaboration with primary care provider regarding development and update of comprehensive plan of care as evidenced by provider attestation and co-signature Inter-disciplinary care team collaboration (see longitudinal plan of care) Evaluation of current treatment plan related to  self management and patient's adherence to plan as established by provider   Heart Failure Interventions:  (Status:  Goal on track:  Yes.) Long Term Goal Provided education on low sodium diet Reviewed Heart Failure Action Plan in depth and provided written copy Discussed importance of daily weight and advised patient to weigh and record daily Reviewed role of diuretics in prevention of fluid overload and management of heart failure; Discussed the importance of keeping all appointments with provider   Chronic Kidney Disease Interventions:  (Status:  Goal on track:  Yes.) Long Term Goal Evaluation of current treatment plan related to chronic kidney disease self management and patient's adherence to plan as established by provider      Reviewed prescribed diet low sodium low CHO Reviewed medications with  patient and discussed importance of compliance    Discussed complications of poorly controlled blood pressure such as heart disease, stroke, circulatory complications, vision complications, kidney impairment, sexual dysfunction    Discussed plans with patient for ongoing care management follow up and provided patient with direct contact information for care management team    Provided education on kidney disease progression    Last practice recorded BP readings:  BP Readings from Last 3 Encounters:  05/22/21 130/80  04/26/21 128/62  01/30/21 118/74  Most recent eGFR/CrCl: No results found for: EGFR  No components found for: CRCL    Diabetes Interventions:  (Status:  Goal on track:  NO.) Long Term Goal Assessed patient's understanding of A1c goal: <7% Provided education to patient about basic DM disease process Reviewed medications with patient and discussed importance of medication adherence Counseled on importance of regular laboratory monitoring as prescribed Discussed plans with patient for ongoing care management follow up and provided patient with direct contact information for care management team Advised patient, providing education and rationale, to check cbg twice daily and record, calling provider for findings outside established parameters Review of patient status, including review of consultants reports, relevant laboratory and other test results, and medications completed Reviewed how diabetes is a progressive disease and her medication needs will change over time including use of basal insulin and GLP-1 agonist. Reviewed to take her glipizide 15-30 minutes before eating.  Reviewed to try to eat healthy protein snacks and to limit her CHO intake.  Discussed changing endocrinologist and given names of other endocrinologist in the area to consider.  Encouraged to call her Part D plan to find out which diabetes drugs are on her formulary Lab Results  Component Value Date   HGBA1C  7.8 (A) 05/22/2021   Hyperlipidemia Interventions:  (Status:  Goal on track:  Yes.) Long Term Goal Medication review performed; medication list updated in electronic medical record.  Counseled on importance of regular laboratory monitoring as prescribed Reviewed role and benefits of statin for ASCVD risk reduction Reviewed importance of limiting foods high in cholesterol Reviewed exercise goals and target of 150 minutes per week  Hypertension Interventions:  (Status:  Goal on track:  Yes.) Long Term Goal  Last practice recorded BP readings:  BP Readings from Last 3 Encounters:  05/22/21 130/80  04/26/21 128/62  01/30/21 118/74  Most recent eGFR/CrCl: No results found for: EGFR  No components found for: CRCL  Evaluation of current treatment plan related to hypertension self management and patient's adherence to plan as established by provider Provided education to patient re: stroke prevention, s/s of heart attack and stroke Reviewed medications with patient and discussed importance of compliance Discussed plans with patient for ongoing care management follow up and provided patient with direct contact information for care management team Advised patient, providing education and rationale, to monitor blood pressure daily and record, calling PCP for findings outside established parameters  Patient Goals/Self-Care Activities: Take all medications as prescribed Attend all scheduled provider appointments Call pharmacy for medication refills 3-7 days in advance of running out of medications Call provider office for new concerns or questions  call office if I gain more than 2 pounds in one day or 5 pounds in one week keep legs up while sitting use salt in moderation weigh myself daily follow rescue plan if symptoms flare-up eat more whole grains, fruits and vegetables, lean meats and healthy fats keep appointment with eye doctor check blood sugar at prescribed times: twice daily and when  you have symptoms of low or high blood sugar check feet daily for cuts, sores or redness drink 6 to 8 glasses of water each day fill half of plate with vegetables manage portion size switch to low-fat or skim milk switch to sugar-free drinks wear comfortable, cotton socks check blood pressure daily choose a place to take my blood pressure (home, clinic or office, retail store) take blood pressure log to all doctor appointments take medications for blood pressure exactly as prescribed eat more whole grains, fruits and vegetables, lean meats and healthy fats call for medicine refill 2 or 3 days before it runs out take all medications exactly as prescribed call doctor with any symptoms you believe are related to your medicine  Follow Up Plan:  Telephone follow up appointment with care management team member scheduled for:  07/19/21 The patient has been provided with contact information for the care management team and has been advised to call with any health related questions or concerns.       Plan:Telephone follow up appointment with care management team member scheduled for:  07/19/21 The patient has been provided with contact information for the care management team and has been advised to call with any health related questions or concerns.  Peter Garter RN, BSN,CCM, CDE Care Management Coordinator [place] [phone], Mobile [phone]

## 2021-06-22 NOTE — Progress Notes (Signed)
Cardiology Clinic Note   Patient Name: Kayla Brown Date of Encounter: 06/23/2021  Primary Care Provider:  [name], MD Primary Cardiologist:  Dr. Peter Brown  Patient Profile    70 year old female with history of chronic dyspnea, OSA, mild pulmonary hypertension, hypertension, dyslipidemia, with additional history and 03/04/2019 of acute PE, acute kidney injury from overdiuresis in the setting of hypoxic respiratory failure and pulmonary edema last seen in the office by Dr. [name] on 12/19/2020.  Past Medical History    Past Medical History:  Diagnosis Date   Anxiety    Diabetes mellitus    Edema of extremities 03/02/2016   Essential hypertension, benign    Lazy eye    Major depressive disorder, single episode, unspecified    Obesity (BMI 30-39.9) 03/01/2016   OSA (obstructive sleep apnea)    Pulmonary HTN (HCC)    mild with PASP 59mHg by echo 05/2016   Pure hypercholesterolemia    Retinopathy, due to hypertension    Past Surgical History:  Procedure Laterality Date   CPAP TITRATION  10/14/2015   DILATION AND CURETTAGE OF UTERUS  2005-2008   x2   EYE SURGERY     cataracts   WISDOM TOOTH EXTRACTION      Allergies  No Known Allergies  History of Present Illness  Mrs. BUmphlettis here for ongoing assessment and management of chronic diastolic heart failure, hypertension, with history as outlined above.  She is doing very well, she denies any fluid retention, dyspnea on exertion, PND or orthopnea.  She is compliant with CPAP.  She is being followed by her PCP and is getting her hemoglobin A1c under control.  She has eliminated salt from her diet.  She does weigh daily and maintain her weight.  She has no complaints today.   Home Medications    Current Outpatient Medications  Medication Sig Dispense Refill   Alcohol Swabs (ALCOHOL PREP) PADS Pt uses an alcohol pad each time sugars are tested. Pt tests twice daily. Dx E11.9 100 each 3   amLODipine (NORVASC) 10  MG tablet TAKE 1 TABLET BY MOUTH EVERY DAY 90 tablet 1   atorvastatin (LIPITOR) 40 MG tablet TAKE 1 TABLET BY MOUTH EVERY DAY 90 tablet 1   B Complex Vitamins (B COMPLEX PO) Take 1 tablet by mouth daily.     buPROPion (WELLBUTRIN XL) 150 MG 24 hr tablet TAKE 1 TABLET BY MOUTH EVERY DAY 90 tablet 2   Calcium Carb-Cholecalciferol [phone] MG-UNIT CAPS Take 1 tablet by mouth 2 (two) times daily.     carvedilol (COREG) 25 MG tablet TAKE 1 TABLET BY MOUTH TWICE A DAY 180 tablet 3   dapagliflozin propanediol (FARXIGA) 10 MG TABS tablet Take 10 mg by mouth daily.     fish oil-omega-3 fatty acids 1000 MG capsule Take 1 g by mouth daily.     fluticasone (FLONASE) 50 MCG/ACT nasal spray SPRAY 2 SPRAYS INTO EACH NOSTRIL EVERY DAY (Patient taking differently: Place 2 sprays into both nostrils daily.) 18 g 1   furosemide (LASIX) 20 MG tablet TAKE 1 TABLET BY MOUTH EVERY DAY 90 tablet 1   gabapentin (NEURONTIN) 100 MG capsule Take 100 mg by mouth 2 (two) times daily.     glipiZIDE (GLUCOTROL) 5 MG tablet Take 2 tabs at breakfast and 2 tabs at supper. 360 tablet 3   glucose blood (FREESTYLE LITE) test strip USE TO TEST BLOOD GLUCOSE 2 TIMES DAILY. Dx E11.9 100 each 12   linagliptin (TRADJENTA) 5  MG TABS tablet Take 1 tablet (5 mg total) by mouth daily. 30 tablet 3   loratadine (CLARITIN) 10 MG tablet TAKE 1 TABLET BY MOUTH EVERY DAY (Patient taking differently: Take 10 mg by mouth daily.) 30 tablet 11   Melatonin 3 MG TABS Take 10 mg by mouth at bedtime.      methocarbamol (ROBAXIN) 500 MG tablet Take 500 mg by mouth 2 (two) times daily.     Multiple Vitamins-Minerals (VISION FORMULA) TABS Take 1 tablet by mouth daily.     OVER THE COUNTER MEDICATION Take 1 tablet by mouth daily. Equate Vision Formula 50+     prednisoLONE acetate (PRED FORTE) 1 % ophthalmic suspension SMARTSIG:In Eye(s)     sertraline (ZOLOFT) 50 MG tablet TAKE 1 TABLET BY MOUTH EVERY DAY 90 tablet 1   traMADol (ULTRAM) 50 MG tablet Take 1  tablet (50 mg total) by mouth every 6 (six) hours as needed. 30 tablet 0   TRUEPLUS LANCETS 30G MISC Use one lancet each time sugars are tested. Pt tests twice daily. Dx. E11.9 100 each 3   UNABLE TO FIND Med Name: Healthwise talking meter 1 kit 0   albuterol (VENTOLIN HFA) 108 (90 Base) MCG/ACT inhaler Inhale 1-2 puffs into the lungs every 6 (six) hours as needed for wheezing or shortness of breath. (Patient not taking: Reported on 06/23/2021)     No current facility-administered medications for this visit.     Family History    Family History  Adopted: Yes  Problem Relation Age of Onset   Alcohol abuse Mother    ADD / ADHD Mother    Dementia Mother    Breast cancer Neg Hx    is adopted.   Social History    Social History   Socioeconomic History   Marital status: Married    Spouse name: Kayla Brown   Number of children: Not on file   Years of education: Not on file   Highest education level: 12th grade  Occupational History   Occupation: retired  Tobacco Use   Smoking status: Never   Smokeless tobacco: Never  Vaping Use   Vaping Use: Never used  Substance and Sexual Activity   Alcohol use: No    Alcohol/week: 0.0 standard drinks   Drug use: No   Sexual activity: Yes    Partners: Male    Birth control/protection: None  Other Topics Concern   Not on file  Social History Narrative   Not on file   Social Determinants of Health   Financial Resource Strain: Low Risk    Difficulty of Paying Living Expenses: Not very hard  Food Insecurity: No Food Insecurity   Worried About Charity fundraiser in the Last Year: Never true   [place] in the Last Year: Never true  Transportation Needs: No Transportation Needs   Lack of Transportation (Medical): No   Lack of Transportation (Non-Medical): No  Physical Activity: Insufficiently Active   Days of Exercise per Week: 3 days   Minutes of Exercise per Session: 10 min  Stress: No Stress Concern Present   Feeling of Stress :  Only a little  Social Connections: Moderately Integrated   Frequency of Communication with Friends and Family: Twice a week   Frequency of Social Gatherings with Friends and Family: Once a week   Attends Religious Services: More than 4 times per year   Active Member of [employer] or Organizations: No   Attends Archivist Meetings: Never  Marital Status: Married  Human resources officer Violence: Not At Risk   Fear of Current or Ex-Partner: No   Emotionally Abused: No   Physically Abused: No   Sexually Abused: No     Review of Systems    General:  No chills, fever, night sweats or weight changes.  Cardiovascular:  No chest pain, dyspnea on exertion, edema, orthopnea, palpitations, paroxysmal nocturnal dyspnea. Dermatological: No rash, lesions/masses Respiratory: No cough, dyspnea Urologic: No hematuria, dysuria Abdominal:   No nausea, vomiting, diarrhea, bright red blood per rectum, melena, or hematemesis Neurologic:  No visual changes, wkns, changes in mental status. All other systems reviewed and are otherwise negative except as noted above.     Physical Exam    VS:  BP 128/60    Pulse (!) 48    Wt 180 lb 3.2 oz (81.7 kg)    LMP 06/25/2008    BMI 35.19 kg/m  , BMI Body mass index is 35.19 kg/m.      GEN: Well nourished, well developed, in no acute distress. HEENT: normal. Neck: Supple, no JVD, carotid bruits, or masses. Cardiac: RRR, no murmurs, rubs, or gallops. No clubbing, cyanosis, edema.  Radials/DP/PT 2+ and equal bilaterally.  Respiratory:  Respirations regular and unlabored, clear to auscultation bilaterally.,  Slightly coarse breath sounds on inspiration no wheezes GI: Soft, nontender, nondistended, BS + x 4. MS: no deformity or atrophy. Skin: warm and dry, no rash. Neuro:  Strength and sensation are intact. Psych: Normal affect.  Accessory Clinical Findings    ECG personally reviewed by me today-sinus bradycardia heart rate of 48 bpm low voltage- No acute  change compared to EKG on 12/19/2020.  Lab Results  Component Value Date   WBC 7.9 04/26/2021   HGB 12.8 04/26/2021   HCT 39.2 04/26/2021   MCV 93.1 04/26/2021   PLT 211.0 04/26/2021   Lab Results  Component Value Date   CREATININE 1.61 (H) 05/09/2021   BUN 36 (H) 05/09/2021   NA 140 05/09/2021   K 4.2 05/09/2021   CL 104 05/09/2021   CO2 27 05/09/2021   Lab Results  Component Value Date   ALT 20 04/26/2021   AST 14 04/26/2021   ALKPHOS 58 04/26/2021   BILITOT 0.3 04/26/2021   Lab Results  Component Value Date   CHOL 126 04/26/2021   HDL 54.30 04/26/2021   LDLCALC 56 04/26/2021   LDLDIRECT 50.0 05/23/2020   TRIG 77.0 04/26/2021   CHOLHDL 2 04/26/2021    Lab Results  Component Value Date   HGBA1C 7.8 (A) 05/22/2021   Prior Studies Reviewed:   Echocardiogram 03/06/2019 1. The left ventricle has hyperdynamic systolic function, with an  ejection fraction of >65%. The cavity size was normal. There is moderate  concentric left ventricular hypertrophy. Left ventricular diastolic  Doppler parameters are consistent with  impaired relaxation. Elevated mean left atrial pressure No evidence of  left ventricular regional wall motion abnormalities.   2. The right ventricle has normal systolic function. The cavity was  normal. There is no increase in right ventricular wall thickness. Right  ventricular systolic pressure could not be assessed.   3. Left atrial size was mildly dilated.   4. Trivial pericardial effusion is present.   5. The aorta is normal unless otherwise noted.   Assessment & Plan   1.  Chronic diastolic heart failure: No evidence of volume overload.  She is medically compliant, has eliminated salt from her diet, weights daily which has been consistent without any  large weight gain or weight loss changes per her scale.  She is medically compliant.  Primary care is providing her with refills on all of her medications.  We will see her again in 6 months unless  she becomes symptomatic.  2.  Hypertension: Blood pressure is well controlled currently.  No changes on any of her medications.  Continue low-sodium diet and increased activity.  3.  Reactive airway disease: Has not been using inhaler but is noticing changes in her breathing with the cold weather.  I have advised her to please use her inhaler to avoid worsening breathing issues  4.  Type 2 diabetes: Has been started on SGLT inhibitor, but was unable to tolerate the Januvia.  She is now started on Tradjenta by her primary care physician.  She has close follow-up to evaluate labs and her response to medicine.  5.  Dyslipidemia: Currently on atorvastatin with goal of LDL less than 70, no coronary artery disease but does have hypertension and diabetes.  Current medicines are reviewed at length with the patient today.  I have spent 25 min's  dedicated to the care of this patient on the date of this encounter to include pre-visit review of records, assessment, management and diagnostic testing,with shared decision making.  Jory Sims, DNP, ANP, AACC  06/23/2021, 9:56 AM  Signed, Phill Myron. West Pugh, ANP, AACC   06/23/2021 9:56 AM    [facility] [address] Office [phone] Fax [phone]  Notice: This dictation was prepared with Dragon dictation along with smaller phrase technology. Any transcriptional errors that result from this process are unintentional and may not be corrected upon review.

## 2021-06-23 ENCOUNTER — Encounter: Payer: Self-pay | Admitting: Adult Health

## 2021-06-23 ENCOUNTER — Ambulatory Visit (INDEPENDENT_AMBULATORY_CARE_PROVIDER_SITE_OTHER): Payer: Medicare Other | Admitting: Adult Health

## 2021-06-23 ENCOUNTER — Other Ambulatory Visit: Payer: Self-pay

## 2021-06-23 VITALS — BP 128/60 | HR 48 | Wt 180.2 lb

## 2021-06-23 DIAGNOSIS — J4520 Mild intermittent asthma, uncomplicated: Secondary | ICD-10-CM | POA: Diagnosis not present

## 2021-06-23 DIAGNOSIS — I10 Essential (primary) hypertension: Secondary | ICD-10-CM | POA: Diagnosis not present

## 2021-06-23 DIAGNOSIS — N1831 Chronic kidney disease, stage 3a: Secondary | ICD-10-CM | POA: Diagnosis not present

## 2021-06-23 DIAGNOSIS — E1122 Type 2 diabetes mellitus with diabetic chronic kidney disease: Secondary | ICD-10-CM

## 2021-06-23 DIAGNOSIS — G4733 Obstructive sleep apnea (adult) (pediatric): Secondary | ICD-10-CM

## 2021-06-23 DIAGNOSIS — I5042 Chronic combined systolic (congestive) and diastolic (congestive) heart failure: Secondary | ICD-10-CM | POA: Diagnosis not present

## 2021-06-23 DIAGNOSIS — E7800 Pure hypercholesterolemia, unspecified: Secondary | ICD-10-CM | POA: Diagnosis not present

## 2021-06-23 DIAGNOSIS — E785 Hyperlipidemia, unspecified: Secondary | ICD-10-CM | POA: Diagnosis not present

## 2021-06-23 NOTE — Patient Instructions (Addendum)
Medication Instructions:  Your Physician recommend you continue on your current medication as directed.    *If you need a refill on your cardiac medications before your next appointment, please call your pharmacy*   Follow-Up: At [facility], you and your health needs are our priority.  As part of our continuing mission to provide you with exceptional heart care, we have created designated Provider Care Teams.  These Care Teams include your primary Cardiologist (physician) and Advanced Practice Providers (APPs -  Physician Assistants and Nurse Practitioners) who all work together to provide you with the care you need, when you need it.  We recommend signing up for the patient portal called "MyChart".  Sign up information is provided on this After Visit Summary.  MyChart is used to connect with patients for Virtual Visits (Telemedicine).  Patients are able to view lab/test results, encounter notes, upcoming appointments, etc.  Non-urgent messages can be sent to your provider as well.   To learn more about what you can do with MyChart, go to [email or web address].    Your next appointment:   December 20, 2021 at 8:20 am  The format for your next appointment:   In Person  Provider:   Dr. [place]

## 2021-06-24 DIAGNOSIS — E1122 Type 2 diabetes mellitus with diabetic chronic kidney disease: Secondary | ICD-10-CM | POA: Diagnosis not present

## 2021-06-24 DIAGNOSIS — I10 Essential (primary) hypertension: Secondary | ICD-10-CM

## 2021-06-24 DIAGNOSIS — I5032 Chronic diastolic (congestive) heart failure: Secondary | ICD-10-CM | POA: Diagnosis not present

## 2021-06-24 DIAGNOSIS — N1831 Chronic kidney disease, stage 3a: Secondary | ICD-10-CM | POA: Diagnosis not present

## 2021-06-24 DIAGNOSIS — E785 Hyperlipidemia, unspecified: Secondary | ICD-10-CM

## 2021-06-27 DIAGNOSIS — Z20822 Contact with and (suspected) exposure to covid-19: Secondary | ICD-10-CM | POA: Diagnosis not present

## 2021-06-27 NOTE — Progress Notes (Signed)
Chronic Care Management Pharmacy Note  06/28/2021 Name:  Kayla Brown MRN:  [id number] DOB:  September 25, 1950  Subjective: Kayla Brown is an 71 y.o. year old female who is a primary patient of [name], [name], MD.  The CCM team was consulted for assistance with disease management and care coordination needs.    Engaged with patient by telephone for follow up visit in response to provider referral for pharmacy case management and/or care coordination services.   Consent to Services:  The patient was given information about Chronic Care Management services, agreed to services, and gave verbal consent prior to initiation of services.  Please see initial visit note for detailed documentation.   Patient Care Team: [name], MD as PCP - General (Family Medicine) [name], MD (Inactive) as Consulting Physician (Gastroenterology) [name], MD as Consulting Physician (Gynecology) [name], RN as Case Manager Edythe Clarity, [facility] (Pharmacist)  Objective:  Lab Results  Component Value Date   CREATININE 1.61 (H) 05/09/2021   CREATININE 1.60 (H) 04/26/2021   CREATININE 1.06 (H) 11/18/2020    Lab Results  Component Value Date   HGBA1C 7.8 (A) 05/22/2021   Last diabetic Eye exam:  Lab Results  Component Value Date/Time   HMDIABEYEEXA No Retinopathy 08/01/2020 03:45 PM    Last diabetic Foot exam:  Lab Results  Component Value Date/Time   HMDIABFOOTEX completed 01/24/2016 12:00 AM        Component Value Date/Time   CHOL 126 04/26/2021 0908   TRIG 77.0 04/26/2021 0908   HDL 54.30 04/26/2021 0908   CHOLHDL 2 04/26/2021 0908   VLDL 15.4 04/26/2021 0908   LDLCALC 56 04/26/2021 0908   LDLCALC 52 11/18/2020 1436   LDLDIRECT 50.0 05/23/2020 1431    Hepatic Function Latest Ref Rng & Units 04/26/2021 11/18/2020 05/23/2020  Total Protein 6.0 - 8.3 g/dL 6.7 6.4 6.6  Albumin 3.5 - 5.2 g/dL 4.1 - 4.3  AST 0 - 37 U/L 14 15 18   ALT 0 - 35 U/L 20  20 29   Alk Phosphatase 39 - 117 U/L 58 - 61  Total Bilirubin 0.2 - 1.2 mg/dL 0.3 0.3 0.3  Bilirubin, Direct 0.0 - 0.3 mg/dL 0.1 0.1 0.1    Lab Results  Component Value Date/Time   TSH 1.59 04/26/2021 09:08 AM   TSH 1.19 11/18/2020 02:36 PM    CBC Latest Ref Rng & Units 04/26/2021 11/18/2020 05/23/2020  WBC 4.0 - 10.5 K/uL 7.9 10.5 10.0  Hemoglobin 12.0 - 15.0 g/dL 12.8 11.7 13.0  Hematocrit 36.0 - 46.0 % 39.2 35.7 39.4  Platelets 150.0 - 400.0 K/uL 211.0 268 287.0    No results found for: VD25OH  Clinical ASCVD:  The ASCVD Risk score (Arnett DK, et al., 2019) failed to calculate for the following reasons:   The valid total cholesterol range is 130 to 320 mg/dL   Social History   Tobacco Use  Smoking Status Never  Smokeless Tobacco Never   BP Readings from Last 3 Encounters:  06/23/21 128/60  05/22/21 130/80  04/26/21 128/62   Pulse Readings from Last 3 Encounters:  06/23/21 (!) 48  05/22/21 68  04/26/21 62   Wt Readings from Last 3 Encounters:  06/23/21 180 lb 3.2 oz (81.7 kg)  05/22/21 179 lb (81.2 kg)  04/26/21 180 lb (81.6 kg)    Assessment: Review of patient past medical history, allergies, medications, health status, including review of consultants reports, laboratory and other test data, was  performed as part of comprehensive evaluation and provision of chronic care management services.   SDOH:  (Social Determinants of Health) assessments and interventions performed: Yes   CCM Care Plan  No Known Allergies  Medications Reviewed Today     Reviewed by Edythe Clarity, [facility] (Pharmacist) on 06/28/21 at [place] List Status: <None>   Medication Order Taking? Sig Documenting Provider Last Dose Status Informant  albuterol (VENTOLIN HFA) 108 (90 Base) MCG/ACT inhaler [id number] Yes Inhale 1-2 puffs into the lungs every 6 (six) hours as needed for wheezing or shortness of breath. [provider] Taking Active   Alcohol Swabs (ALCOHOL PREP) PADS  [id number] Yes Pt uses an alcohol pad each time sugars are tested. Pt tests twice daily. Dx E11.9 [name], MD Taking Active Spouse/Significant Other  amLODipine (NORVASC) 10 MG tablet [id number] Yes TAKE 1 TABLET BY MOUTH EVERY DAY [name], MD Taking Active   atorvastatin (LIPITOR) 40 MG tablet [id number] Yes TAKE 1 TABLET BY MOUTH EVERY DAY [name], MD Taking Active   B Complex Vitamins (B COMPLEX PO) [id number] Yes Take 1 tablet by mouth daily. [provider] Taking Active   buPROPion (WELLBUTRIN XL) 150 MG 24 hr tablet [id number] Yes TAKE 1 TABLET BY MOUTH EVERY DAY [name], MD Taking Active   Calcium Carb-Cholecalciferol [id number] MG-UNIT CAPS [id number] Yes Take 1 tablet by mouth 2 (two) times daily. [provider] Taking Active   carvedilol (COREG) 25 MG tablet [id number] Yes TAKE 1 TABLET BY MOUTH TWICE A DAY [name], [name], MD Taking Active   dapagliflozin propanediol (FARXIGA) 10 MG TABS tablet [id number] Yes Take 10 mg by mouth daily. [provider] Taking Active Self           Med Note Leroy Sea, JACOB   Wed Jan 25, 2021  9:18 AM) Gets through patient assistance.   fish oil-omega-3 fatty acids 1000 MG capsule [id number] Yes Take 1 g by mouth daily. [provider] Taking Active Spouse/Significant Other  fluticasone (FLONASE) 50 MCG/ACT nasal spray [id number] Yes SPRAY 2 SPRAYS INTO EACH NOSTRIL EVERY DAY  Patient taking differently: Place 2 sprays into both nostrils daily.   [name], MD Taking Active   furosemide (LASIX) 20 MG tablet [id number] Yes TAKE 1 TABLET BY MOUTH EVERY DAY [name], MD Taking Active   gabapentin (NEURONTIN) 100 MG capsule [id number] Yes Take 100 mg by mouth 2 (two) times daily. [provider] Taking Active   glipiZIDE (GLUCOTROL) 5 MG tablet [id number] Yes Take 2 tabs at breakfast and 2 tabs at supper. Shamleffer, Melanie Crazier, MD Taking Active   glucose blood  (FREESTYLE LITE) test strip [id number] Yes USE TO TEST BLOOD GLUCOSE 2 TIMES DAILY. Dx E11.9 [name], MD Taking Active Spouse/Significant Other  linagliptin (TRADJENTA) 5 MG TABS tablet [id number] Yes Take 1 tablet (5 mg total) by mouth daily. [name], MD Taking Active   loratadine (CLARITIN) 10 MG tablet [id number] Yes TAKE 1 TABLET BY MOUTH EVERY DAY  Patient taking differently: Take 10 mg by mouth daily.   [name], MD Taking Active   Melatonin 3 MG TABS [id number] Yes Take 10 mg by mouth at bedtime.  [provider] Taking Active Spouse/Significant Other  methocarbamol (ROBAXIN) 500 MG tablet [id number] Yes Take 500 mg by mouth 2 (two) times daily. [provider] Taking Active   Multiple Vitamins-Minerals (VISION FORMULA) TABS [id number] Yes Take 1 tablet by mouth  daily. [provider] Taking Active Spouse/Significant Other  OVER THE COUNTER MEDICATION [id number] Yes Take 1 tablet by mouth daily. Equate Vision Formula 50+ [provider] Taking Active Spouse/Significant Other  prednisoLONE acetate (PRED FORTE) 1 % ophthalmic suspension [id number] Yes SMARTSIG:In Eye(s) [provider] Taking Active   sertraline (ZOLOFT) 50 MG tablet [id number] Yes TAKE 1 TABLET BY MOUTH EVERY DAY [name], MD Taking Active   traMADol (ULTRAM) 50 MG tablet [id number] Yes Take 1 tablet (50 mg total) by mouth every 6 (six) hours as needed. Aundra Dubin, PA-C Taking Active   TRUEPLUS LANCETS 30G MISC [id number] Yes Use one lancet each time sugars are tested. Pt tests twice daily. Dx. E11.9 [name], MD Taking Active Spouse/Significant Other  UNABLE TO FIND [id number] Yes Med Name: [place] talking meter Shamleffer, Melanie Crazier, MD Taking Active             Patient Active Problem List   Diagnosis Date Noted   Reactive airway disease 01/30/2021   Morbid obesity ([place]) 11/18/2020   Osteopenia 11/18/2020   Type 2  diabetes mellitus with stage 3a chronic kidney disease, without long-term current use of insulin ([place]) 07/21/2020   Type 2 diabetes mellitus with hyperglycemia, with long-term current use of insulin ([place]) 04/19/2020   Lumbar radiculopathy 03/02/2020   Left hip pain 12/01/2019   Type 2 diabetes mellitus with stage 3b chronic kidney disease, without long-term current use of insulin ([place]) 18/29/9371   Diastolic CHF ([place]) 69/67/8938   Dyspnea 03/08/2019   Pulmonary edema 03/04/2019   Physical exam 02/06/2017   Anxiety and depression 12/05/2016   Obesity (BMI 30-39.9) 03/01/2016   OSA (obstructive sleep apnea) 07/11/2015   Pulmonary hypertension ([place]) 05/06/2015   Heart murmur 03/03/2015   Essential hypertension, benign 03/18/2014   Hyperlipidemia 03/18/2014   Post-menopause on HRT (hormone replacement therapy) 03/18/2014    Immunization History  Administered Date(s) Administered   Fluad Quad(high Dose 65+) 02/16/2019, 03/28/2020, 04/26/2021   Influenza,inj,Quad PF,6+ Mos 04/24/2016, 03/04/2017, 03/20/2018   PFIZER(Purple Top)SARS-COV-2 Vaccination 09/24/2019, 10/15/2019, 04/25/2020   Pneumococcal Conjugate-13 07/06/2016   Pneumococcal Polysaccharide-23 07/01/2017   Tdap 09/23/2009    Conditions to be addressed/monitored: CHF, HTN, HLD, Hypertriglyceridemia, DMII, CKD Stage 3-4, Anxiety, and Depression   Care Plan : [place]  Updates made by Edythe Clarity, RPH since 06/28/2021 12:00 AM     Problem: Hypertension, Hyperlipidemia, Diabetes, Heart Failure and Chronic Kidney Disease   Priority: High     Long-Range Goal: Disease Management   Start Date: 08/25/2020  Expected End Date: 08/25/2021  Recent Progress: On track  Priority: High  Note:   Current Barriers:  Unable to independently afford treatment regimen Unable tomaintain control of DM  Pharmacist Clinical Goal(s):  Patient will contact provider office for questions/concerns as evidenced notation of same  in electronic health record through collaboration with PharmD and provider.   Interventions: 1:1 collaboration with [name], MD regarding development and update of comprehensive plan of care as evidenced by provider attestation and co-signature Inter-disciplinary care team collaboration (see longitudinal plan of care) Comprehensive medication review performed; medication list updated in electronic medical record  CHF: (goal: minimize symptoms, ensure appropriate medication use) -Controlled -Continues with daily weights. Denies frequent weight gain >3lb, has not taken additional dose past 1-2 months.  -Current treatment: Furosemide 20 mg once daily Appropriate, Effective, Safe, Accessible  Carvedilol 25 mg twice daily Appropriate, Effective, Safe, Accessible -October: 174.2, 175.0, 174.2, 175.2  -  Educated on appropriate use of furosemide -Counseled on diet and exercise extensively Recommended to continue current medication  Update 06/28/21 Continues to check her weights daily and monitor for swelling. She denies any SOB at this point, using her albuterol on average of once per day. Denies any swelling or weight gain. Appears euvolemic today in office. Continue current meds - continue to monitor weight and contact providers with noticeable swelling or shortness of breath.  Depression (Goal: minimize symptoms and medication related side effects) -Controlled -Again reports a very positive response to bupropion in terms of mood and attentiveness, feels that anxiety continues to be a challenge so may want to explore additional options in the future here. -PHQ 3 (April 05 2021) -GAD7 10 (April 05 2021) -Current treatment: Sertraline 50 mg once daily Bupropion 150 mg XL once daily  -PHQ9: 0 (August 2022) -Educated on Benefits of medication for symptom control  - Recommended to continue current medication Could consider sertraline increase to 100 mg or addition of buspar  for ongoing anxiety  BH declined at this time   Diabetes (A1c goal <7%) -Not ideally controlled -Now getting Farxiga Rx through PAP sent to Dr Bishop [place] office.  -Temporary increase in FBGs following birthday weekend celebration.  -Current medications: Farxiga 10 mg once daily Appropriate, Effective, Safe, Accessible Glipizide 5 mg twice daily before a meal - Appropriate, Query effective Tradjenta 54m daily - Appropriate, Query effective,  -Medications previously tried:   -Current home glucose readings:  October 1st-12th 2022 - 138, 178 (after birthday), 165, 138, 137, 140, 114, 118, 129, 117 August 2022 - 100s FBGs. Some PPD >200s attributed to snacking June 2022 - 141, 179, 211, 143 Denies hypoglycemic/hyperglycemic symptoms -Current exercise: October - walking 2-3x per week 10-20 minutes at a time. Goal is to start walking more -Educated on A1c and blood sugar goals; Benefits of routine self-monitoring of blood sugar; -Counseled on diet and exercise extensively Recommended to continue current medication Reminder set for November 2022 to reach out to Dr UHollie Salkto ensure new eRx sent in for following year on [facility]  Update 06/28/21 Patient has recently discontinued metformin due to GFR. She was started on Tradjenta approximately 1 month ago. Blood sugars are around 150-180 fasting and are anywhere from 180-320 after she eats a meal. We spent a considerable amount of time today on dietary discussion: limiting carbs to one source per meal and cutting back on excess sweets from candies. We also discussed trying to implement some form of physical activity routine (walking). We have agreed to have her work on diet x 1 week, will have CMA follow up to check on glucose readings. If still elevated we could consider replacing Tradjenta w/ GLP-1 providing she is willing to do injection. Will follow up in one week to determine next steps.          Medication  Assistance:  FWilder Gladeobtained through [employer] medication assistance program.  Enrollment ends 05/2021  Patient's preferred pharmacy is:  [id number] [place]one: [phone]: [phone] Follow Up:  Patient agrees to Care Plan and Follow-up.  Plan:  2 month RPHurley/u visit scheduled  Future Appointments  Date Time Provider [place]1/10/2021 11:00 AM [facility] [place] [facility]  07/04/2021  9:00 AM YoBaird Lyons, MD LBPU-PULCARE None  07/19/2021  3:00 PM [facility] SF-CCM CARE MGR [facility] PEC  08/14/2021  9:45 AM  Gardiner Barefoot, DPM [facility]  09/20/2021  8:10 AM Shamleffer, Melanie Crazier, MD [facility]-LBENDO None  10/24/2021  8:30 AM [name], MD [facility] P[facility]  12/20/2021  8:20 AM [name], [name], MD [facility] [place], PharmD Clinical Pharmacist  [facility] [phone]

## 2021-06-28 ENCOUNTER — Ambulatory Visit (INDEPENDENT_AMBULATORY_CARE_PROVIDER_SITE_OTHER): Payer: Medicare Other | Admitting: Pharmacist

## 2021-06-28 DIAGNOSIS — E1122 Type 2 diabetes mellitus with diabetic chronic kidney disease: Secondary | ICD-10-CM

## 2021-06-28 DIAGNOSIS — N1832 Chronic kidney disease, stage 3b: Secondary | ICD-10-CM

## 2021-06-28 DIAGNOSIS — I5032 Chronic diastolic (congestive) heart failure: Secondary | ICD-10-CM

## 2021-06-28 NOTE — Patient Instructions (Addendum)
Visit Information   Goals Addressed             This Visit's Progress    A1c <7%   Not on track    CARE PLAN ENTRY Diabetes: type 2 Lab Results  Component Value Date   HGBA1C 7.3 (H) 11/19/2019   Lab Results  Component Value Date   CREATININE 1.08 11/19/2019   CREATININE 1.17 (H) 11/09/2019   CREATININE 1.15 05/25/2019  Current antihyperglycemic regimen: Metformin XL 500 mg two tabs (1000 mg) twice daily Glipizide IR 5 mg every morning before breakfast, 5 mg before supper   Pharmacist Clinical Goal(s):  Over the next 180 days, patient will work with PharmD and primary care provider as needed to achieve A1c<7% PAP application coordination on Farxiga  Interventions: Comprehensive medication review performed, medication list updated in electronic medical record Inter-disciplinary care team collaboration (see longitudinal plan of care)  Patient Self Care Activities:  Reduce consumption of sweets Patient will check blood glucose every morning, document, and provide at future appointments Patient will focus on medication adherence by continuing current medication management  Patient will take medications as prescribed Patient will contact provider with any episodes of hypoglycemia Patient will report any questions or concerns to provider   Initial goal documentation and Please see past updates related to this goal by clicking on the "Past Updates" button in the selected goal .  06/28/21 - Counseled extensively on diet changes to follow up in 1 week       Long-Range Goal: Disease Management   Start Date: 08/25/2020  Expected End Date: 08/25/2021  Recent Progress: On track  Priority: High  Note:   Current Barriers:  Unable to independently afford treatment regimen Unable tomaintain control of DM  Pharmacist Clinical Goal(s):  Patient will contact provider office for questions/concerns as evidenced notation of same in electronic health record through collaboration with  PharmD and provider.   Interventions: 1:1 collaboration with Sheliah Hatch, MD regarding development and update of comprehensive plan of care as evidenced by provider attestation and co-signature Inter-disciplinary care team collaboration (see longitudinal plan of care) Comprehensive medication review performed; medication list updated in electronic medical record  CHF: (goal: minimize symptoms, ensure appropriate medication use) -Controlled -Continues with daily weights. Denies frequent weight gain >3lb, has not taken additional dose past 1-2 months.  -Current treatment: Furosemide 20 mg once daily Appropriate, Effective, Safe, Accessible  Carvedilol 25 mg twice daily Appropriate, Effective, Safe, Accessible -October: 174.2, 175.0, 174.2, 175.2  -Educated on appropriate use of furosemide -Counseled on diet and exercise extensively Recommended to continue current medication  Update 06/28/21 Continues to check her weights daily and monitor for swelling. She denies any SOB at this point, using her albuterol on average of once per day. Denies any swelling or weight gain. Appears euvolemic today in office. Continue current meds - continue to monitor weight and contact providers with noticeable swelling or shortness of breath.  Depression (Goal: minimize symptoms and medication related side effects) -Controlled -Again reports a very positive response to bupropion in terms of mood and attentiveness, feels that anxiety continues to be a challenge so may want to explore additional options in the future here. -PHQ 3 (April 05 2021) -GAD7 10 (April 05 2021) -Current treatment: Sertraline 50 mg once daily Bupropion 150 mg XL once daily  -PHQ9: 0 (August 2022) -Educated on Benefits of medication for symptom control  - Recommended to continue current medication Could consider sertraline increase to 100 mg or addition of  buspar for ongoing anxiety  BH declined at this time    Diabetes (A1c goal <7%) -Not ideally controlled -Now getting Farxiga Rx through PAP sent to Dr Beaulah Corin office.  -Temporary increase in FBGs following birthday weekend celebration.  -Current medications: Farxiga 10 mg once daily Appropriate, Effective, Safe, Accessible Glipizide 5 mg twice daily before a meal - Appropriate, Query effective Tradjenta 5mg  daily - Appropriate, Query effective,  -Medications previously tried:   -Current home glucose readings:  October 1st-12th 2022 - 138, 178 (after birthday), 165, 138, 137, 140, 114, 118, 129, 117 August 2022 - 100s FBGs. Some PPD >200s attributed to snacking June 2022 - 141, 179, 211, 143 Denies hypoglycemic/hyperglycemic symptoms -Current exercise: October - walking 2-3x per week 10-20 minutes at a time. Goal is to start walking more -Educated on A1c and blood sugar goals; Benefits of routine self-monitoring of blood sugar; -Counseled on diet and exercise extensively Recommended to continue current medication Reminder set for November 2022 to reach out to Dr December 2022 to ensure new eRx sent in for following year on farxiga - pharmacy is [facility]  Update 06/28/21 Patient has recently discontinued metformin due to GFR. She was started on Tradjenta approximately 1 month ago. Blood sugars are around 150-180 fasting and are anywhere from 180-320 after she eats a meal. We spent a considerable amount of time today on dietary discussion: limiting carbs to one source per meal and cutting back on excess sweets from candies. We also discussed trying to implement some form of physical activity routine (walking). We have agreed to have her work on diet x 1 week, will have CMA follow up to check on glucose readings. If still elevated we could consider replacing Tradjenta w/ GLP-1 providing she is willing to do injection. Will follow up in one week to determine next steps.          Patient verbalizes understanding of instructions provided today  and agrees to view in MyChart.  Telephone follow up appointment with pharmacy team member scheduled for: 3 months  08/26/21, [facility]  [facility], PharmD Clinical Pharmacist  [facility] [phone]

## 2021-06-29 ENCOUNTER — Ambulatory Visit
Admission: RE | Admit: 2021-06-29 | Discharge: 2021-06-29 | Disposition: A | Discharge status: Home / Self Care | Payer: Medicare Other | Source: Ambulatory Visit | Attending: Nephrology | Admitting: Nephrology

## 2021-06-29 DIAGNOSIS — N189 Chronic kidney disease, unspecified: Secondary | ICD-10-CM | POA: Diagnosis not present

## 2021-06-29 DIAGNOSIS — N1831 Chronic kidney disease, stage 3a: Secondary | ICD-10-CM

## 2021-07-02 ENCOUNTER — Encounter: Payer: Self-pay | Admitting: Internal Medicine

## 2021-07-02 ENCOUNTER — Other Ambulatory Visit: Payer: Self-pay | Admitting: Internal Medicine

## 2021-07-03 NOTE — Progress Notes (Signed)
HPI F never smoker followed for OSA, Dyspnea improved after treatment for heart failure. Complicated by anxiety and depression, DM2, HBP, Hyperlipidemia, Morbid obesity, OSA, Pulmonary Hypertension(PASP 71mmHg by echo 02/2015),   NPSG 05/14/16 AHI 93/ hr, desaturation to 84% PFT 04/04/15   - mild Diffusion defect, normal flows and volumes without response to bronchodilator PFT 04/24/2019- minimal obst w airtrapping, sl resp to BD, Nl DLCO 2D echo February 02, 2019 EF [id number], normal systolic function right ventricle, mild to moderately dilated left atrium, RV systolic function appeared to be normal with normal RV size 2D echo March 06, 2019 EF greater than 65%, moderate LVH, right ventricle normal systolic function, right ventricle systolic pressure could not be assessed.  CT chest March 05, 2019 extensive atelectatic changes throughout the lungs with a dependent predominance, mosaic attenuation throughout the lungs, emphysema  VQ scan 03/04/2019 no unmatched segmental perfusion defects to suggest acute PE Autoimmune work-up negative 2020 , SH /Occupational hx unrevealing  ------------------------------------------------------------------------------------------   07/04/20- 69 yoF never smoker followed for OSA, Dyspnea improved after treatment for heart failure. Complicated by anxiety and depression, DM2, HBP, Hyperlipidemia, Morbid obesity, Pulmonary Hypertension(PASP 70mmHg by echo 02/2015), Ventolin hfa, Flonase, Melatonin,  CPAP 19/ [employer] Download- compliance 100%, AHI 0.6/ hr Body weight today- 175 lbs Covid vax-3 Phizer Flu vax-Had Saw Dr Donneta Romberg Cardiology for f/u in December. -----Patient states that in November machine started making a "wheezing" noise. States machine is working good.  She was given medrol taper in December for arthritis in her spine.  07/04/21- 70 yoF never smoker followed for OSA, Dyspnea improved after treatment for heart failure. Complicated by anxiety and  depression, DM2, HBP, Hyperlipidemia, Morbid obesity, Pulmonary Hypertension(PASP 102mmHg by echo [id number] Diastolic CHF,  -Ventolin hfa, Flonase, Melatonin,  CPAP 19/ [employer]   AirSense 10 Auto replaced 2022 Download- compliance 1005. AHI 0.4/ hr Body weight today-  Covid vax-3 Phizer Flu vax-had -----Patient is sleeping good. Patient wants you to listen to her chest/lungs states that her cardiologist heard something the other day. Otherwise no concerns I reviewed her cardiology note referring to coarse breath sounds with no particular concern.  She has been trying to use her rescue inhaler once a day but really cannot appreciate any significant difference from it and does not note wheeze or cough. Got replacement CPAP machine.  Doing very well.  Download reviewed.  ROS-see HPI   + = positive Constitutional:    weight loss, night sweats, fevers, chills, fatigue, lassitude. HEENT:    headaches, difficulty swallowing, tooth/dental problems, sore throat,       sneezing, itching, ear ache, nasal congestion, post nasal drip, snoring CV:    chest pain, orthopnea, PND, swelling in lower extremities, anasarca,                                   dizziness, palpitations Resp:   +shortness of breath with exertion or at rest.                productive cough,   non-productive cough, coughing up of blood.              change in color of mucus.  wheezing.   Skin:    rash or lesions. GI:  No-   heartburn, indigestion, abdominal pain, nausea, vomiting, diarrhea,                 change in bowel habits, loss  of appetite GU: dysuria, change in color of urine, no urgency or frequency.   flank pain. MS:  + joint pain, stiffness, decreased range of motion, back pain. Neuro-     nothing unusual Psych:  change in mood or affect.  depression or anxiety.   memory loss.  OBJ- Physical Exam General- Alert, Oriented, Affect-appropriate, Distress- none acute, + obese Skin- rash-none, lesions- none, excoriation-  none Lymphadenopathy- none Head- atraumatic            Eyes- +strabismus            Ears- Hearing, canals-normal            Nose- Clear, no-Septal dev, mucus, polyps, erosion, perforation             Throat- Mallampati II , mucosa clear , drainage- none, tonsils- atrophic Neck- flexible , trachea midline, no stridor , thyroid nl, carotid no bruit Chest - symmetrical excursion , unlabored           Heart/CV- RRR , + trace S murmur , no gallop  , no rub, nl s1 s2                           - JVD- none , edema- none, stasis changes- none, varices- none           Lung- +clear, wheeze- none, cough- none , dullness-none, rub- none           Chest wall-  Abd-  Br/ Gen/ Rectal- Not done, not indicated Extrem- cyanosis- none, clubbing, none, atrophy- none, strength- nl Neuro- grossly intact to observation

## 2021-07-04 ENCOUNTER — Ambulatory Visit (INDEPENDENT_AMBULATORY_CARE_PROVIDER_SITE_OTHER): Payer: Medicare Other | Admitting: Internal Medicine

## 2021-07-04 ENCOUNTER — Ambulatory Visit (INDEPENDENT_AMBULATORY_CARE_PROVIDER_SITE_OTHER): Payer: Medicare Other

## 2021-07-04 ENCOUNTER — Encounter: Payer: Self-pay | Admitting: Internal Medicine

## 2021-07-04 ENCOUNTER — Other Ambulatory Visit: Payer: Self-pay

## 2021-07-04 VITALS — BP 130/60 | HR 53 | Temp 97.8°F | Ht 59.0 in | Wt 181.4 lb

## 2021-07-04 DIAGNOSIS — R0609 Other forms of dyspnea: Secondary | ICD-10-CM | POA: Diagnosis not present

## 2021-07-04 DIAGNOSIS — R0600 Dyspnea, unspecified: Secondary | ICD-10-CM | POA: Diagnosis not present

## 2021-07-04 NOTE — Patient Instructions (Signed)
Order- CXR   dx Dyspnea on exertion  Ok to use your albuterol inhaler from time to time as you feel you you want to. See if it makes any difference.  Please call if we can help

## 2021-07-04 NOTE — Assessment & Plan Note (Signed)
Benefits from CPAP and doing well with replacement machine still set on fixed pressure 19. Plan-continue current settings

## 2021-07-04 NOTE — Assessment & Plan Note (Signed)
Breath sounds are clear to my exam at this visit. Plan-since there has been question, she will continue trying her Ventolin albuterol inhaler from time to time and see if she finds it is making a difference.  Consider giving her a maintenance controller sample but I do not think she would be able to feel a benefit.  Chest x-ray today.

## 2021-07-07 ENCOUNTER — Telehealth: Payer: Self-pay | Admitting: Pharmacist

## 2021-07-07 DIAGNOSIS — N1832 Chronic kidney disease, stage 3b: Secondary | ICD-10-CM

## 2021-07-07 DIAGNOSIS — E1122 Type 2 diabetes mellitus with diabetic chronic kidney disease: Secondary | ICD-10-CM

## 2021-07-07 MED ORDER — OZEMPIC (0.25 OR 0.5 MG/DOSE) 2 MG/1.5ML ~~LOC~~ SOPN: PEN_INJECTOR | SUBCUTANEOUS | 3 refills | Status: DC

## 2021-07-07 NOTE — Chronic Care Management (AMB) (Signed)
Communicated change to patient.  New order called in for Ozempic 0.25mg  once weekly x 4 weeks, then increase to 0.5mg  once weekly after that.  Patient aware to stop Tradjenta.  Will have CMA check up on tolerance in two weeks.  Willa Frater, PharmD Clinical Pharmacist  [facility] [phone]

## 2021-07-07 NOTE — Telephone Encounter (Signed)
-----  Message from [name], MD sent at 07/06/2021 10:45 AM EST ----- We don't keep samples in the office b/c of the strict regulations on dispensing them.  And I agree, Ozempic, Farxiga, and Glipizide sound like a good plan.  Stopping the Tradjenta.  Thanks!!  ----- Message ----- From: Edythe Clarity, [facility] Sent: 07/06/2021   9:42 AM EST To: [name], MD  Just reached back out to her.  She was all for starting Ozempic.  I think she would be a great candidate as long as she could use correctly.   I will go ahead and initiate the patient assistance application, do we have any samples in the mean time she could get started on?  Would you want her to continue on Ozempic, Farxiga and glipizide and d/c the Tradjenta?  Gerald Stabs   ----- Message ----- From: [name], MD Sent: 07/05/2021   3:41 PM EST To: Edythe Clarity, [facility]  I would be all for switching her medication.  She is very sweet but she struggles to understand her disease state.  We have gone over what constitutes a carbohydrate innumerable times.  If she is comfortable with the dosing I think it's a great idea!  Thanks! Anda Kraft ----- Message ----- From: Edythe Clarity, [facility] Sent: 07/05/2021   3:26 PM EST To: [name], MD  Hey patient called me about her glucose readings.  They have been reading high lately per her home reports.  Lowest reading was in the 140s and her fasting this morning was 190.  We went over diet last week and she tried with no luck this week as well as increased her exercise.  When I met with her last week I asked if she was opposed to injectable medication which she was not. Wondering your thoughts on switching her Tradjenta to [place] pending we can get her approved for patient assistance to offset any cost?  I will be happy to follow up with her!  Beverly Milch, PharmD Clinical Pharmacist  [facility] [phone]

## 2021-07-07 NOTE — Progress Notes (Signed)
° ° °  Chronic Care Management Pharmacy Assistant   Name: ADALIDA GARVER  MRN: [id number] DOB: April 23, 1951   Reason for Encounter: PAP for Ozempic   PAP form initiated for  Ozempic. Will be mailed to patient to complete patient portion and return to prescribing MD office for final portion to be completed by MD and faxed in for patient for processing. Patient will update on the outcome of her application once received.   [name], [facility] Clinical Pharmacist Assistant  [phone]

## 2021-07-15 IMAGING — DX DG CHEST 1V PORT
1 series · 1 of 1 positions shown · non-contrast
Comparison: 03/03/2019

CLINICAL DATA: Hypoxia

EXAM:
PORTABLE CHEST 1 VIEW

[chest]
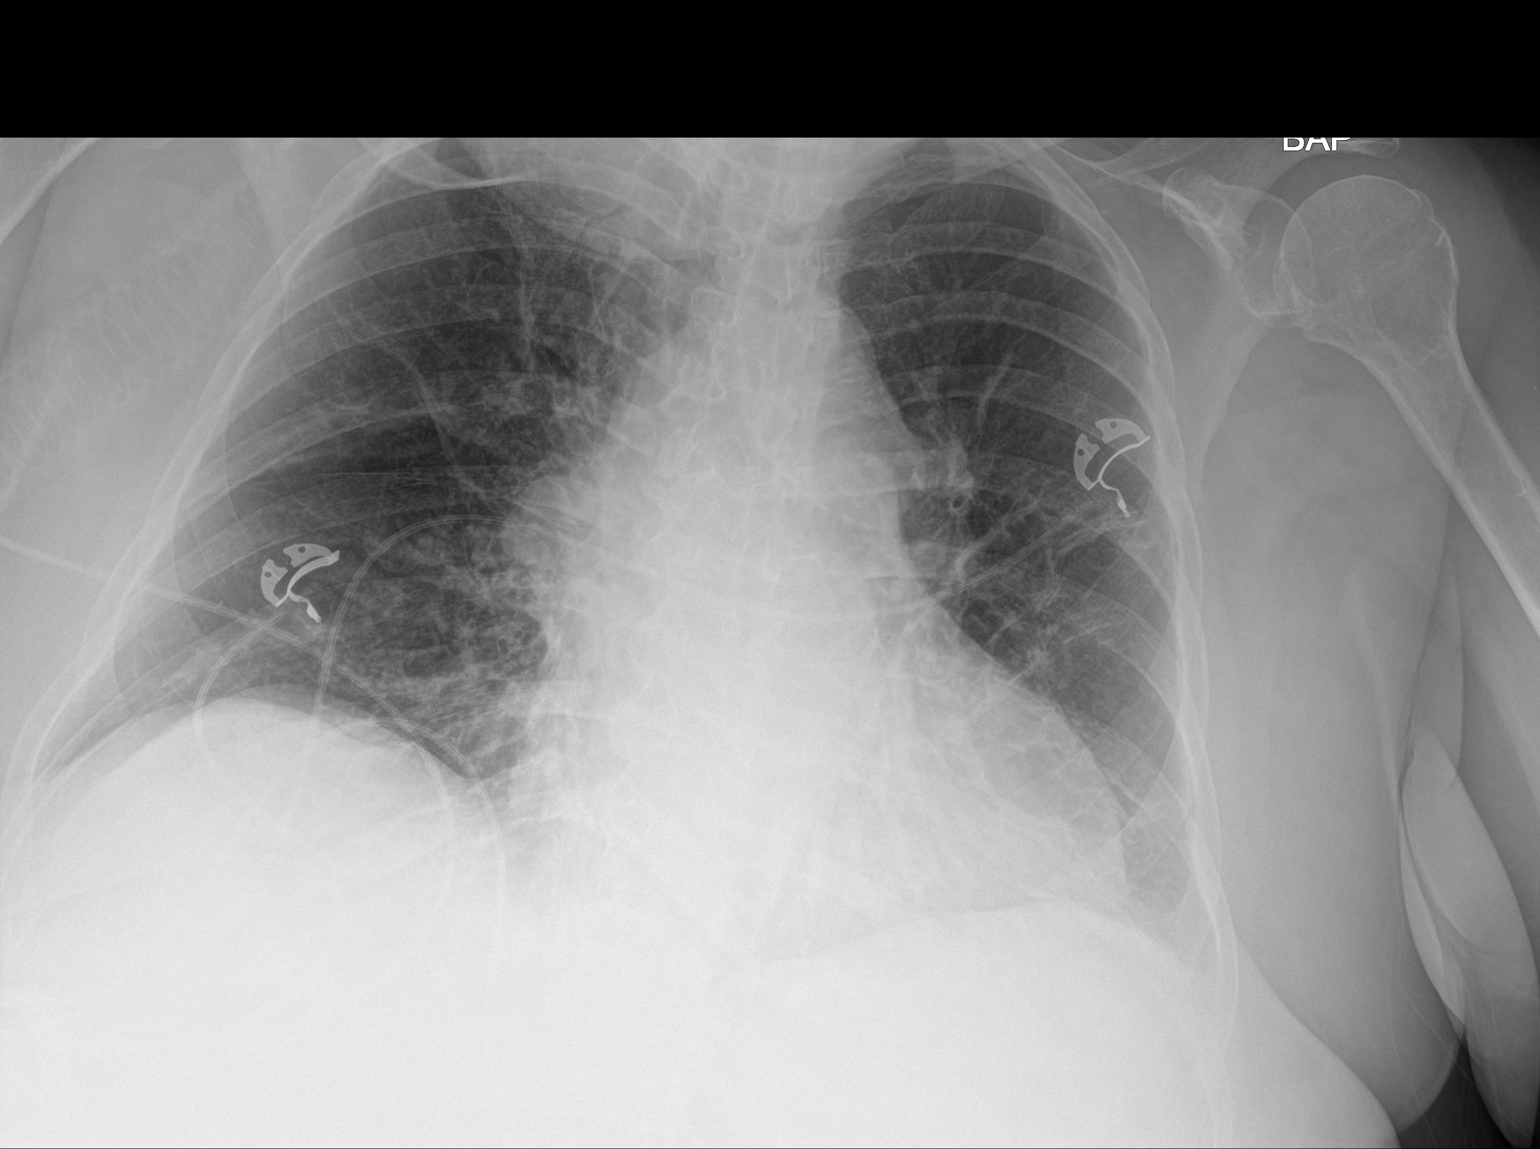

[1 of 1 positions shown; findings below may reference images not displayed]

FINDINGS: Cardiomegaly. Mild irregular scarring or atelectasis of the mid
lungs bilaterally. No acute appearing airspace opacity.
IMPRESSION: Cardiomegaly. Mild irregular scarring or atelectasis of the mid
lungs bilaterally. No acute appearing airspace opacity.

## 2021-07-19 ENCOUNTER — Ambulatory Visit: Payer: Medicare Other

## 2021-07-19 DIAGNOSIS — I10 Essential (primary) hypertension: Secondary | ICD-10-CM

## 2021-07-19 DIAGNOSIS — N1832 Chronic kidney disease, stage 3b: Secondary | ICD-10-CM

## 2021-07-19 DIAGNOSIS — E785 Hyperlipidemia, unspecified: Secondary | ICD-10-CM

## 2021-07-19 DIAGNOSIS — I5032 Chronic diastolic (congestive) heart failure: Secondary | ICD-10-CM

## 2021-07-19 NOTE — Patient Instructions (Signed)
Visit Information  Thank you for taking time to visit with me today. Please don't hesitate to contact me if I can be of assistance to you before our next scheduled telephone appointment.  Following are the goals we discussed today:  Take all medications as prescribed Attend all scheduled provider appointments Call pharmacy for medication refills 3-7 days in advance of running out of medications Call provider office for new concerns or questions  call office if I gain more than 2 pounds in one day or 5 pounds in one week keep legs up while sitting use salt in moderation weigh myself daily follow rescue plan if symptoms flare-up eat more whole grains, fruits and vegetables, lean meats and healthy fats keep appointment with eye doctor check blood sugar at prescribed times: twice daily and when you have symptoms of low or high blood sugar check feet daily for cuts, sores or redness drink 6 to 8 glasses of water each day fill half of plate with vegetables manage portion size switch to low-fat or skim milk switch to sugar-free drinks wear comfortable, cotton socks check blood pressure daily choose a place to take my blood pressure (home, clinic or office, retail store) take blood pressure log to all doctor appointments take medications for blood pressure exactly as prescribed eat more whole grains, fruits and vegetables, lean meats and healthy fats call for medicine refill 2 or 3 days before it runs out take all medications exactly as prescribed call doctor with any symptoms you believe are related to your medicine  Our next appointment is by telephone on 08/30/21 at 3 PM  Please call the care guide team at [phone] if you need to cancel or reschedule your appointment.   If you are experiencing a Mental Health or Behavioral Health Crisis or need someone to talk to, please call the Suicide and Crisis Lifeline: 988 call the [place] National Suicide Prevention Lifeline: 628-040-6612 or  TTY: (918)317-0631 TTY (367)599-4053) to talk to a trained counselor call 1-800-273-TALK (toll free, 24 hour hotline) go to [facility] [address] [place] [phone]) call 911   Patient verbalizes understanding of instructions and care plan provided today and agrees to view in MyChart. Active MyChart status confirmed with patient.    Dudley Major RN, Maximiano Coss, CDE Care Management Coordinator [facility] [phone]

## 2021-07-19 NOTE — Chronic Care Management (AMB) (Signed)
Chronic Care Management   CCM RN Visit Note  07/19/2021 Name: Kayla Brown MRN: [id number] DOB: April 11, 1951  Subjective: Kayla Brown is a 71 y.o. year old female who is a primary care patient of Birdie Riddle, [name], MD. The care management team was consulted for assistance with disease management and care coordination needs.    Engaged with patient by telephone for follow up visit in response to provider referral for case management and/or care coordination services.   Consent to Services:  The patient was given information about Chronic Care Management services, agreed to services, and gave verbal consent prior to initiation of services.  Please see initial visit note for detailed documentation.   Patient agreed to services and verbal consent obtained.   Assessment: Review of patient past medical history, allergies, medications, health status, including review of consultants reports, laboratory and other test data, was performed as part of comprehensive evaluation and provision of chronic care management services.   SDOH (Social Determinants of Health) assessments and interventions performed:    CCM Care Plan  No Known Allergies  Outpatient Encounter Medications as of 07/19/2021  Medication Sig Note   albuterol (VENTOLIN HFA) 108 (90 Base) MCG/ACT inhaler Inhale 1-2 puffs into the lungs every 6 (six) hours as needed for wheezing or shortness of breath.    Alcohol Swabs (ALCOHOL PREP) PADS Pt uses an alcohol pad each time sugars are tested. Pt tests twice daily. Dx E11.9    amLODipine (NORVASC) 10 MG tablet TAKE 1 TABLET BY MOUTH EVERY DAY    atorvastatin (LIPITOR) 40 MG tablet TAKE 1 TABLET BY MOUTH EVERY DAY    B Complex Vitamins (B COMPLEX PO) Take 1 tablet by mouth daily.    buPROPion (WELLBUTRIN XL) 150 MG 24 hr tablet TAKE 1 TABLET BY MOUTH EVERY DAY    Calcium Carb-Cholecalciferol [phone] MG-UNIT CAPS Take 1 tablet by mouth 2 (two) times daily.    carvedilol (COREG) 25  MG tablet TAKE 1 TABLET BY MOUTH TWICE A DAY    Chromium Picolinate 800 MCG TABS Take 1 tablet by mouth daily.    dapagliflozin propanediol (FARXIGA) 10 MG TABS tablet Take 10 mg by mouth daily. 01/25/2021: Gets through patient assistance.    fish oil-omega-3 fatty acids 1000 MG capsule Take 1 g by mouth daily.    fluticasone (FLONASE) 50 MCG/ACT nasal spray SPRAY 2 SPRAYS INTO EACH NOSTRIL EVERY DAY (Patient taking differently: Place 2 sprays into both nostrils daily.)    furosemide (LASIX) 20 MG tablet TAKE 1 TABLET BY MOUTH EVERY DAY    gabapentin (NEURONTIN) 100 MG capsule Take 100 mg by mouth 2 (two) times daily.    glipiZIDE (GLUCOTROL) 5 MG tablet Take 2 tabs at breakfast and 2 tabs at supper.    glucose blood (FREESTYLE LITE) test strip USE TO TEST BLOOD GLUCOSE 2 TIMES DAILY. Dx E11.9    loratadine (CLARITIN) 10 MG tablet TAKE 1 TABLET BY MOUTH EVERY DAY (Patient taking differently: Take 10 mg by mouth daily.)    Melatonin 3 MG TABS Take 10 mg by mouth at bedtime.     methocarbamol (ROBAXIN) 500 MG tablet Take 500 mg by mouth 2 (two) times daily.    Multiple Vitamins-Minerals (VISION FORMULA) TABS Take 1 tablet by mouth daily.    OVER THE COUNTER MEDICATION Take 1 tablet by mouth daily. Equate Vision Formula 50+    prednisoLONE acetate (PRED FORTE) 1 % ophthalmic suspension SMARTSIG:In Eye(s)    Semaglutide,0.25 or 0.5MG/DOS, (OZEMPIC,  0.25 OR 0.5 MG/DOSE,) 2 MG/1.5ML SOPN Inject 0.25 mg into the skin for 4 weeks, then increase to 0.34m once weekly.    sertraline (ZOLOFT) 50 MG tablet TAKE 1 TABLET BY MOUTH EVERY DAY    traMADol (ULTRAM) 50 MG tablet Take 1 tablet (50 mg total) by mouth every 6 (six) hours as needed.    TRUEPLUS LANCETS 30G MISC Use one lancet each time sugars are tested. Pt tests twice daily. Dx. E11.9    UNABLE TO FIND Med Name: Healthwise talking meter    No facility-administered encounter medications on file as of 07/19/2021.    Patient Active Problem List    Diagnosis Date Noted   Reactive airway disease 01/30/2021   Morbid obesity ([facility] 11/18/2020   Osteopenia 11/18/2020   Type 2 diabetes mellitus with stage 3a chronic kidney disease, without long-term current use of insulin ([facility] 07/21/2020   Type 2 diabetes mellitus with hyperglycemia, with long-term current use of insulin ([facility] 04/19/2020   Lumbar radiculopathy 03/02/2020   Left hip pain 12/01/2019   Type 2 diabetes mellitus with stage 3b chronic kidney disease, without long-term current use of insulin ([facility] 188/41/6606  Diastolic CHF ([facility] [place] 030/16/0109  Dyspnea 03/08/2019   Pulmonary edema 03/04/2019   Physical exam 02/06/2017   Anxiety and depression 12/05/2016   Obesity (BMI 30-39.9) 03/01/2016   OSA (obstructive sleep apnea) 07/11/2015   Pulmonary hypertension ([facility] 05/06/2015   Heart murmur 03/03/2015   Essential hypertension, benign 03/18/2014   Hyperlipidemia 03/18/2014   Post-menopause on HRT (hormone replacement therapy) 03/18/2014    Conditions to be addressed/monitored:CHF, HTN, HLD, DMII, and CKD Stage 3  Care Plan : RN Care Manager Plan of Care  Updates made by SDimitri Ped RN since 07/19/2021 12:00 AM     Problem: Chronic Disease Management and Care Coordination Needs (DM, CHF, CKD,HTN and HLD)   Priority: High     Long-Range Goal: Establish Plan of Care for Chronic Disease Management Needs (DM, CHF, CKD,HTN and HLD)   Start Date: 06/14/2021  Expected End Date: 06/11/2022  Priority: High  Note:   Current Barriers:  Knowledge Deficits related to plan of care for management of CHF, HTN, HLD, DMII, and CKD Stage 3  Care Coordination needs related to Financial constraints related to cost of medications Chronic Disease Management support and education needs related to CHF, HTN, HLD, DMII, and CKD Stage 3  States that she is now on Ozempic which she started 2 weeks ago.  States her CBGs have been getting better with morning ranges 122-158 and before supper  219-245. States she has been trying to eat less and she is feeling fuller with less food. States she is trying to only have one CHO with protein at meals and snacks.  States she does not drink anything with sugar and her diet drinks are clear.   States her weight had gone up at the first of the year  and she had to take any extra Lasix. States her weight went down after one dose of Lasix.  Denies any swelling, chest pains or shortness of breath.  States her B/P has been stable.  States her back pain has been good.  States she has been walking 2-3 times a week.  RNCM Clinical Goal(s):  Patient will verbalize understanding of plan for management of CHF, HTN, HLD, DMII, and CKD Stage 3 as evidenced by voiced adherence to plan of care verbalize basic understanding of  CHF, HTN, HLD, DMII, and CKD  Stage 3 disease process and self health management plan as evidenced by voiced understanding and teach back take all medications exactly as prescribed and will call provider for medication related questions as evidenced by dispense report and pt verbalization attend all scheduled medical appointments: Nephrology 09/08/21,CCM PharmD 09/27/21,cardiology 12/20/21, Pulmonary 07/05/22, Dr. Birdie Riddle 10/24/21 as evidenced by medical records demonstrate Improved adherence to prescribed treatment plan for CHF, HTN, HLD, DMII, and CKD Stage 3 as evidenced by readings within limits, voiced adherence to plan of care continue to work with RN Care Manager to address care management and care coordination needs related to  CHF, HTN, HLD, DMII, and CKD Stage 3 as evidenced by adherence to CM Team Scheduled appointments work with pharmacist to address Financial constraints related to cost of medications related toCHF, HTN, HLD, DMII, and CKD Stage 3 as evidenced by review or EMR and patient or pharmacist report through collaboration with RN Care manager, provider, and care team.   Interventions: 1:1 collaboration with primary care provider  regarding development and update of comprehensive plan of care as evidenced by provider attestation and co-signature Inter-disciplinary care team collaboration (see longitudinal plan of care) Evaluation of current treatment plan related to  self management and patient's adherence to plan as established by provider   Heart Failure Interventions:  (Status:  Goal on track:  Yes.) Long Term Goal Provided education on low sodium diet Reviewed Heart Failure Action Plan in depth and provided written copy Discussed importance of daily weight and advised patient to weigh and record daily Reviewed role of diuretics in prevention of fluid overload and management of heart failure; Discussed the importance of keeping all appointments with provider Reviewed to take Lasix as needed for weight gain   Chronic Kidney Disease Interventions:  (Status:  Goal on track:  Yes.) Long Term Goal Evaluation of current treatment plan related to chronic kidney disease self management and patient's adherence to plan as established by provider      Reviewed prescribed diet low sodium low CHO Reviewed medications with patient and discussed importance of compliance    Discussed complications of poorly controlled blood pressure such as heart disease, stroke, circulatory complications, vision complications, kidney impairment, sexual dysfunction    Discussed plans with patient for ongoing care management follow up and provided patient with direct contact information for care management team    Provided education on kidney disease progression    Reviewed importance of keep blood sugars under control to help with kidney function Last practice recorded BP readings:  BP Readings from Last 3 Encounters:  05/22/21 130/80  04/26/21 128/62  01/30/21 118/74  Most recent eGFR/CrCl: No results found for: EGFR  No components found for: CRCL    Diabetes Interventions:  (Status:  Goal on track:  Yes.) Long Term Goal Assessed patient's  understanding of A1c goal: <7% Provided education to patient about basic DM disease process Reviewed medications with patient and discussed importance of medication adherence Counseled on importance of regular laboratory monitoring as prescribed Discussed plans with patient for ongoing care management follow up and provided patient with direct contact information for care management team Advised patient, providing education and rationale, to check cbg twice daily and record, calling provider for findings outside established parameters Review of patient status, including review of consultants reports, relevant laboratory and other test results, and medications completed Reviewed to continue to walk 2-3 times a week and how exercise can help lower blood sugars. Reviewed  to eat healthy protein snacks and to  limit her CHO intake.  Reviewed fasting blood sugar goals of 80-130 and less than 180 1 1/2-2 hours after meals Lab Results  Component Value Date   HGBA1C 7.8 (A) 05/22/2021   Hyperlipidemia Interventions:  (Status:  Condition stable.  Not addressed this visit.) Long Term Goal Medication review performed; medication list updated in electronic medical record.  Counseled on importance of regular laboratory monitoring as prescribed Reviewed role and benefits of statin for ASCVD risk reduction Reviewed importance of limiting foods high in cholesterol Reviewed exercise goals and target of 150 minutes per week  Hypertension Interventions:  (Status:  Goal on track:  Yes.) Long Term Goal Last practice recorded BP readings:  BP Readings from Last 3 Encounters:  05/22/21 130/80  04/26/21 128/62  01/30/21 118/74  Most recent eGFR/CrCl: No results found for: EGFR  No components found for: CRCL  Evaluation of current treatment plan related to hypertension self management and patient's adherence to plan as established by provider Provided education to patient re: stroke prevention, s/s of heart attack  and stroke Reviewed medications with patient and discussed importance of compliance Discussed plans with patient for ongoing care management follow up and provided patient with direct contact information for care management team Advised patient, providing education and rationale, to monitor blood pressure daily and record, calling PCP for findings outside established parameters Discussed complications of poorly controlled blood pressure such as heart disease, stroke, circulatory complications, vision complications, kidney impairment, sexual dysfunction  Patient Goals/Self-Care Activities: Take all medications as prescribed Attend all scheduled provider appointments Call pharmacy for medication refills 3-7 days in advance of running out of medications Call provider office for new concerns or questions  call office if I gain more than 2 pounds in one day or 5 pounds in one week keep legs up while sitting use salt in moderation weigh myself daily follow rescue plan if symptoms flare-up eat more whole grains, fruits and vegetables, lean meats and healthy fats keep appointment with eye doctor check blood sugar at prescribed times: twice daily and when you have symptoms of low or high blood sugar check feet daily for cuts, sores or redness drink 6 to 8 glasses of water each day fill half of plate with vegetables manage portion size switch to low-fat or skim milk switch to sugar-free drinks wear comfortable, cotton socks check blood pressure daily choose a place to take my blood pressure (home, clinic or office, retail store) take blood pressure log to all doctor appointments take medications for blood pressure exactly as prescribed eat more whole grains, fruits and vegetables, lean meats and healthy fats call for medicine refill 2 or 3 days before it runs out take all medications exactly as prescribed call doctor with any symptoms you believe are related to your medicine  Follow Up Plan:   Telephone follow up appointment with care management team member scheduled for:  08/30/21 The patient has been provided with contact information for the care management team and has been advised to call with any health related questions or concerns.       Plan:Telephone follow up appointment with care management team member scheduled for:  08/30/21 The patient has been provided with contact information for the care management team and has been advised to call with any health related questions or concerns.  Peter Garter RN, Jackquline Denmark, CDE Care Management Coordinator [facility] [phone]

## 2021-07-20 ENCOUNTER — Telehealth: Payer: Self-pay | Admitting: Pharmacist

## 2021-07-20 NOTE — Progress Notes (Signed)
° ° °  Chronic Care Management Pharmacy Assistant   Name: [name]  MRN: [id number] DOB: 1951/02/26   Reason for Encounter: CMA Follow up Call  Per CPP called to follow up with patient on how she is tolerating Ozempic.    Patient states she is tolerating well. Her fasting blood sugar this am was 143. Its coming down she reported she is doing well with it with no side effects. She will plan to fill out her PAP application and return it to the office once she is finished.     [name], [facility] Clinical Pharmacist Assistant  [phone]

## 2021-07-21 IMAGING — DX DG CHEST 1V PORT
1 series · 1 of 1 positions shown · non-contrast
Comparison: Chest radiograph dated 03/05/2019

CLINICAL DATA: 67-year-old female with a proxy.

EXAM:
PORTABLE CHEST 1 VIEW

[chest]
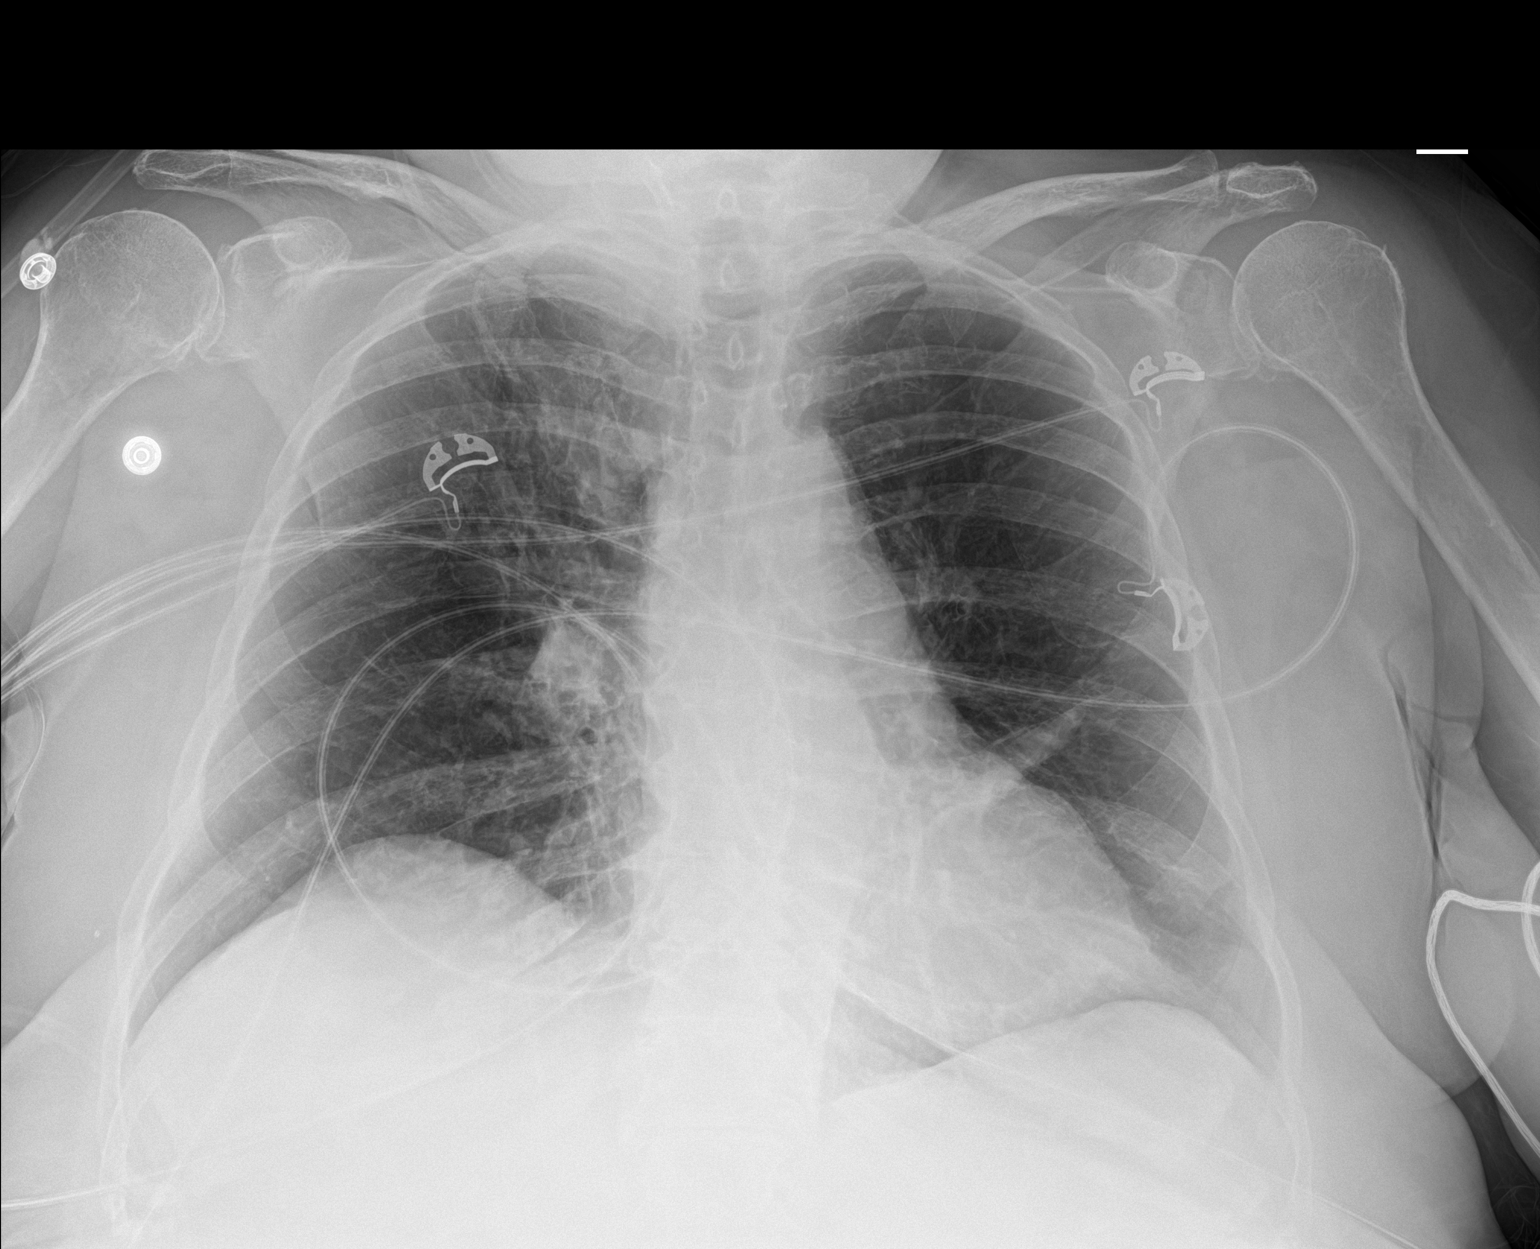

[1 of 1 positions shown; findings below may reference images not displayed]

FINDINGS: Chronic interstitial coarsening and mild bronchitic changes. No
focal consolidation, pleural effusion, or pneumothorax. Linear
density in the left mid lung field, likely atelectatic changes. The
cardiac silhouette is within normal limits. No acute osseous
pathology.
IMPRESSION: No active disease.

## 2021-07-25 DIAGNOSIS — I10 Essential (primary) hypertension: Secondary | ICD-10-CM | POA: Diagnosis not present

## 2021-07-25 DIAGNOSIS — E785 Hyperlipidemia, unspecified: Secondary | ICD-10-CM

## 2021-07-25 DIAGNOSIS — I5032 Chronic diastolic (congestive) heart failure: Secondary | ICD-10-CM | POA: Diagnosis not present

## 2021-07-25 DIAGNOSIS — E1122 Type 2 diabetes mellitus with diabetic chronic kidney disease: Secondary | ICD-10-CM | POA: Diagnosis not present

## 2021-07-25 DIAGNOSIS — N1832 Chronic kidney disease, stage 3b: Secondary | ICD-10-CM | POA: Diagnosis not present

## 2021-07-26 DIAGNOSIS — M4316 Spondylolisthesis, lumbar region: Secondary | ICD-10-CM | POA: Diagnosis not present

## 2021-08-01 ENCOUNTER — Telehealth: Payer: Self-pay | Admitting: Pharmacist

## 2021-08-01 NOTE — Progress Notes (Signed)
Chronic Care Management Pharmacy Assistant   Name: Kayla Brown  MRN: [id number] DOB: 06/12/1951  Reason for Encounter: Disease State - Diabetes Call     Recent office visits:  None noted.   Recent consult visits:  None noted.   Hospital visits:  None in previous 6 months  Medications: Outpatient Encounter Medications as of 08/01/2021  Medication Sig Note   albuterol (VENTOLIN HFA) 108 (90 Base) MCG/ACT inhaler Inhale 1-2 puffs into the lungs every 6 (six) hours as needed for wheezing or shortness of breath.    Alcohol Swabs (ALCOHOL PREP) PADS Pt uses an alcohol pad each time sugars are tested. Pt tests twice daily. Dx E11.9    amLODipine (NORVASC) 10 MG tablet TAKE 1 TABLET BY MOUTH EVERY DAY    atorvastatin (LIPITOR) 40 MG tablet TAKE 1 TABLET BY MOUTH EVERY DAY    B Complex Vitamins (B COMPLEX PO) Take 1 tablet by mouth daily.    buPROPion (WELLBUTRIN XL) 150 MG 24 hr tablet TAKE 1 TABLET BY MOUTH EVERY DAY    Calcium Carb-Cholecalciferol [phone] MG-UNIT CAPS Take 1 tablet by mouth 2 (two) times daily.    carvedilol (COREG) 25 MG tablet TAKE 1 TABLET BY MOUTH TWICE A DAY    Chromium Picolinate 800 MCG TABS Take 1 tablet by mouth daily.    dapagliflozin propanediol (FARXIGA) 10 MG TABS tablet Take 10 mg by mouth daily. 01/25/2021: Gets through patient assistance.    fish oil-omega-3 fatty acids 1000 MG capsule Take 1 g by mouth daily.    fluticasone (FLONASE) 50 MCG/ACT nasal spray SPRAY 2 SPRAYS INTO EACH NOSTRIL EVERY DAY (Patient taking differently: Place 2 sprays into both nostrils daily.)    furosemide (LASIX) 20 MG tablet TAKE 1 TABLET BY MOUTH EVERY DAY    gabapentin (NEURONTIN) 100 MG capsule Take 100 mg by mouth 2 (two) times daily.    glipiZIDE (GLUCOTROL) 5 MG tablet Take 2 tabs at breakfast and 2 tabs at supper.    glucose blood (FREESTYLE LITE) test strip USE TO TEST BLOOD GLUCOSE 2 TIMES DAILY. Dx E11.9    loratadine (CLARITIN) 10 MG tablet TAKE 1 TABLET BY  MOUTH EVERY DAY (Patient taking differently: Take 10 mg by mouth daily.)    Melatonin 3 MG TABS Take 10 mg by mouth at bedtime.     methocarbamol (ROBAXIN) 500 MG tablet Take 500 mg by mouth 2 (two) times daily.    Multiple Vitamins-Minerals (VISION FORMULA) TABS Take 1 tablet by mouth daily.    OVER THE COUNTER MEDICATION Take 1 tablet by mouth daily. Equate Vision Formula 50+    prednisoLONE acetate (PRED FORTE) 1 % ophthalmic suspension SMARTSIG:In Eye(s)    Semaglutide,0.25 or 0.5MG /DOS, (OZEMPIC, 0.25 OR 0.5 MG/DOSE,) 2 MG/1.5ML SOPN Inject 0.25 mg into the skin for 4 weeks, then increase to 0.5mg  once weekly.    sertraline (ZOLOFT) 50 MG tablet TAKE 1 TABLET BY MOUTH EVERY DAY    traMADol (ULTRAM) 50 MG tablet Take 1 tablet (50 mg total) by mouth every 6 (six) hours as needed.    TRUEPLUS LANCETS 30G MISC Use one lancet each time sugars are tested. Pt tests twice daily. Dx. E11.9    UNABLE TO FIND Med Name: Healthwise talking meter    No facility-administered encounter medications on file as of 08/01/2021.    Current antihyperglycemic regimen:  Farxiga 10 mg once daily  Glipizide 5 mg twice daily before a meal -  Ozempic 0.25 mg once  weekly   What recent interventions/DTPs have been made to improve glycemic control:  Patient was recently taken off Trajenta and placed on Ozempic 0.25 mg weekly.She begins her next increased dose this weekend.  Have there been any recent hospitalizations or ED visits since last visit with CPP?  Patient has not had any ED visits or hospitalizations since last visit with CPP.  Patient denies hypoglycemic symptoms, including Pale, Sweaty, Shaky, Hungry, Nervous/irritable, and Vision changes   Patient denies hyperglycemic symptoms, including blurry vision, excessive thirst, fatigue, polyuria, and weakness   How often are you checking your blood sugar? Patient reports she is checking her blood sugars daily.    What are your blood sugars ranging?   Fasting: 120-180 Before meals:  After meals:  Bedtime:   During the week, how often does your blood glucose drop below 70?  Patient denied any readings 70 or lower.   Are you checking your feet daily/regularly? Patient reported she does keep a close eye on her feet.    Adherence Review: Is the patient currently on a STATIN medication? Yes Is the patient currently on ACE/ARB medication? No Does the patient have >5 day gap between last estimated fill dates? No   Care Gaps  AWV: doe 12/12/20 Colonoscopy: due 12/15/26 DM Eye Exam: due 08/01/21 (has appt 08/04/21) DM Foot Exam: due 05/12/22 Microalbumin: done 05/22/21 HbgAIC: done 05/22/21 (7.8) DEXA: done 05/29/21 Mammogram: done 02/06/21  Star Rating Drugs: glipiZIDE (GLUCOTROL) 5 MG tablet - last filled 05/22/21 90 days  atorvastatin (LIPITOR) 40 MG tablet - last filled 05/11/21 90 days   Future Appointments  Date Time Provider Department Center  08/14/2021  9:45 AM Helane Gunther, DPM [facility]  08/30/2021  3:00 PM [facility] CARE [facility]  09/20/2021  8:10 AM Shamleffer, Konrad Dolores, MD [facility] None  09/27/2021  3:00 PM [facility] PHARMACIST [facility]  10/24/2021  8:30 AM Sheliah Hatch, MD [facility]  12/20/2021  8:20 AM [place], [name], MD [facility]  07/05/2022  9:00 AM Waymon Budge, MD [facility] None    [name], [facility] Clinical Pharmacist Assistant  [phone]

## 2021-08-04 DIAGNOSIS — H04123 Dry eye syndrome of bilateral lacrimal glands: Secondary | ICD-10-CM | POA: Diagnosis not present

## 2021-08-04 DIAGNOSIS — E119 Type 2 diabetes mellitus without complications: Secondary | ICD-10-CM | POA: Diagnosis not present

## 2021-08-04 DIAGNOSIS — H524 Presbyopia: Secondary | ICD-10-CM | POA: Diagnosis not present

## 2021-08-04 LAB — HM DIABETES EYE EXAM

## 2021-08-14 ENCOUNTER — Other Ambulatory Visit: Payer: Self-pay

## 2021-08-14 ENCOUNTER — Ambulatory Visit (INDEPENDENT_AMBULATORY_CARE_PROVIDER_SITE_OTHER): Payer: Medicare Other | Admitting: Podiatry

## 2021-08-14 ENCOUNTER — Encounter: Payer: Self-pay | Admitting: Podiatry

## 2021-08-14 DIAGNOSIS — N1832 Chronic kidney disease, stage 3b: Secondary | ICD-10-CM

## 2021-08-14 DIAGNOSIS — M79675 Pain in left toe(s): Secondary | ICD-10-CM

## 2021-08-14 DIAGNOSIS — E1122 Type 2 diabetes mellitus with diabetic chronic kidney disease: Secondary | ICD-10-CM | POA: Diagnosis not present

## 2021-08-14 DIAGNOSIS — M79674 Pain in right toe(s): Secondary | ICD-10-CM | POA: Diagnosis not present

## 2021-08-14 DIAGNOSIS — B351 Tinea unguium: Secondary | ICD-10-CM

## 2021-08-14 NOTE — Progress Notes (Signed)
This patient returns to my office for at risk foot care.  This patient requires this care by a professional since this patient will be at risk due to having diabetes mellitus.  This patient is unable to cut nails herself since the patient cannot reach her nails.These nails are painful walking and wearing shoes.  This patient presents for at risk foot care today.  General Appearance  Alert, conversant and in no acute stress.  Vascular  Dorsalis pedis and posterior tibial  pulses are palpable  bilaterally.  Capillary return is within normal limits  bilaterally. Temperature is within normal limits  bilaterally.  Neurologic  Senn-Weinstein monofilament wire test within normal limits  bilaterally. Muscle power within normal limits bilaterally.  Nails Thick disfigured discolored nails with subungual debris  from hallux to fifth toes bilaterally. No evidence of bacterial infection or drainage bilaterally.  Orthopedic  No limitations of motion  feet .  No crepitus or effusions noted.  No bony pathology or digital deformities noted.  HAV  B/L.  Hammer toes  B/L.  Skin  normotropic skin with no porokeratosis noted bilaterally.  No signs of infections or ulcers noted.     Onychomycosis  Pain in right toes  Pain in left toes  Consent was obtained for treatment procedures.   Mechanical debridement of nails 1-5  bilaterally performed with a nail nipper.  Filed with dremel without incident.    Return office visit                     Told patient to return for periodic foot care and evaluation due to potential at risk complications.   Cybill Uriegas DPM   

## 2021-08-22 ENCOUNTER — Other Ambulatory Visit: Payer: Self-pay | Admitting: Family Medicine

## 2021-08-24 ENCOUNTER — Telehealth: Payer: Self-pay | Admitting: Pharmacist

## 2021-08-24 DIAGNOSIS — M5416 Radiculopathy, lumbar region: Secondary | ICD-10-CM | POA: Diagnosis not present

## 2021-08-24 NOTE — Progress Notes (Signed)
? ? ?Chronic Care Management ?Pharmacy Assistant  ? ?Name: Kayla Brown  MRN: [id number] DOB: 1950/08/12 ? ? ?Reason for Encounter: Disease State - General Adherence Call  ?  ? ?Recent office visits:  ?None noted.  ? ?Recent consult visits:  ?08/14/21 Helane Gunther, DPM - Podiatry - Onychomycosis or toenails on both feet - No changes noted. Follow up in 3 months.  ? ?Hospital visits:  ?None in previous 6 months ? ?Medications: ?Outpatient Encounter Medications as of 08/24/2021  ?Medication Sig Note  ? albuterol (VENTOLIN HFA) 108 (90 Base) MCG/ACT inhaler Inhale 1-2 puffs into the lungs every 6 (six) hours as needed for wheezing or shortness of breath.   ? Alcohol Swabs (ALCOHOL PREP) PADS Pt uses an alcohol pad each time sugars are tested. Pt tests twice daily. Dx E11.9   ? amLODipine (NORVASC) 10 MG tablet TAKE 1 TABLET BY MOUTH EVERY DAY   ? atorvastatin (LIPITOR) 40 MG tablet TAKE 1 TABLET BY MOUTH EVERY DAY   ? B Complex Vitamins (B COMPLEX PO) Take 1 tablet by mouth daily.   ? buPROPion (WELLBUTRIN XL) 150 MG 24 hr tablet TAKE 1 TABLET BY MOUTH EVERY DAY   ? Calcium Carb-Cholecalciferol [phone] MG-UNIT CAPS Take 1 tablet by mouth 2 (two) times daily.   ? carvedilol (COREG) 25 MG tablet TAKE 1 TABLET BY MOUTH TWICE A DAY   ? Chromium Picolinate 800 MCG TABS Take 1 tablet by mouth daily.   ? dapagliflozin propanediol (FARXIGA) 10 MG TABS tablet Take 10 mg by mouth daily. 01/25/2021: Gets through patient assistance.   ? fish oil-omega-3 fatty acids 1000 MG capsule Take 1 g by mouth daily.   ? fluticasone (FLONASE) 50 MCG/ACT nasal spray SPRAY 2 SPRAYS INTO EACH NOSTRIL EVERY DAY (Patient taking differently: Place 2 sprays into both nostrils daily.)   ? furosemide (LASIX) 20 MG tablet TAKE 1 TABLET BY MOUTH EVERY DAY   ? gabapentin (NEURONTIN) 100 MG capsule Take 100 mg by mouth 2 (two) times daily.   ? glipiZIDE (GLUCOTROL) 5 MG tablet Take 2 tabs at breakfast and 2 tabs at supper.   ? glucose blood (FREESTYLE  LITE) test strip USE TO TEST BLOOD GLUCOSE 2 TIMES DAILY. Dx E11.9   ? loratadine (CLARITIN) 10 MG tablet TAKE 1 TABLET BY MOUTH EVERY DAY (Patient taking differently: Take 10 mg by mouth daily.)   ? Melatonin 3 MG TABS Take 10 mg by mouth at bedtime.    ? methocarbamol (ROBAXIN) 500 MG tablet Take 500 mg by mouth 2 (two) times daily.   ? Multiple Vitamins-Minerals (VISION FORMULA) TABS Take 1 tablet by mouth daily.   ? OVER THE COUNTER MEDICATION Take 1 tablet by mouth daily. Equate Vision Formula 50+   ? prednisoLONE acetate (PRED FORTE) 1 % ophthalmic suspension SMARTSIG:In Eye(s)   ? Semaglutide,0.25 or 0.5MG /DOS, (OZEMPIC, 0.25 OR 0.5 MG/DOSE,) 2 MG/1.5ML SOPN Inject 0.25 mg into the skin for 4 weeks, then increase to 0.5mg  once weekly.   ? sertraline (ZOLOFT) 50 MG tablet TAKE 1 TABLET BY MOUTH EVERY DAY   ? traMADol (ULTRAM) 50 MG tablet Take 1 tablet (50 mg total) by mouth every 6 (six) hours as needed.   ? TRUEPLUS LANCETS 30G MISC Use one lancet each time sugars are tested. Pt tests twice daily. Dx. E11.9   ? UNABLE TO FIND Med Name: Healthwise talking meter   ? ?No facility-administered encounter medications on file as of 08/24/2021.  ? ? ?Have you  had any problems recently with your health? ?Patient denied any problems with her health recently. She reported she got a shot in her back today but its helping.  ? ?Have you had any problems with your pharmacy? ?Patient denied any problems with her current pharmacy.  ? ?What issues or side effects are you having with your medications? ?Patient denied any issues or side effects with her current medications.  ? ?What would you like me to pass along to Erskine Emery, CPP for them to help you with?  ?Patient did not have anything to pass along to CPP at this time.  ? ?What can we do to take care of you better? ?Patient did not have any recommendations at this time.  ? ?Care Gaps ?  ?AWV: doe 12/12/20 ?Colonoscopy: due 12/15/26 ?DM Eye Exam: due 08/01/21 (has appt  08/04/21) ?DM Foot Exam: due 05/12/22 ?Microalbumin: done 05/22/21 ?HbgAIC: done 05/22/21 (7.8) ?DEXA: done 05/29/21 ?Mammogram: done 02/06/21 ? ? ?Star Rating Drugs: ?atorvastatin (LIPITOR) 40 MG tablet - last filled 08/08/21 90 days  ?glipiZIDE (GLUCOTROL) 5 MG tablet - last filled 08/11/21 90 days  ? ?Future Appointments  ?Date Time Provider Department Center  ?08/30/2021  3:00 PM [facility] CARE [facility]  ?09/20/2021  8:10 AM Shamleffer, Konrad Dolores, MD [facility] None  ?09/27/2021  3:00 PM [facility] PHARMACIST [facility]  ?10/24/2021  8:30 AM Sheliah Hatch, MD [facility]  ?11/15/2021  8:15 AM Helane Gunther, DPM [facility]  ?12/20/2021  8:20 AM [place], [name], MD [facility]  ?07/05/2022  9:00 AM Waymon Budge, MD [facility] None  ? ? ?Liza Showfety, CCMA ?Clinical Pharmacist Assistant  ?[phone] ? ? ?

## 2021-08-30 ENCOUNTER — Ambulatory Visit (INDEPENDENT_AMBULATORY_CARE_PROVIDER_SITE_OTHER): Payer: Medicare Other

## 2021-08-30 DIAGNOSIS — E785 Hyperlipidemia, unspecified: Secondary | ICD-10-CM

## 2021-08-30 DIAGNOSIS — E1122 Type 2 diabetes mellitus with diabetic chronic kidney disease: Secondary | ICD-10-CM

## 2021-08-30 DIAGNOSIS — N1832 Chronic kidney disease, stage 3b: Secondary | ICD-10-CM

## 2021-08-30 DIAGNOSIS — I5032 Chronic diastolic (congestive) heart failure: Secondary | ICD-10-CM

## 2021-08-30 DIAGNOSIS — I10 Essential (primary) hypertension: Secondary | ICD-10-CM

## 2021-08-30 DIAGNOSIS — M5442 Lumbago with sciatica, left side: Secondary | ICD-10-CM

## 2021-08-30 NOTE — Patient Instructions (Signed)
Visit Information ? ?Thank you for taking time to visit with me today. Please don't hesitate to contact me if I can be of assistance to you before our next scheduled telephone appointment. ? ?Following are the goals we discussed today:  ?Take all medications as prescribed ?Attend all scheduled provider appointments ?Call pharmacy for medication refills 3-7 days in advance of running out of medications ?Call provider office for new concerns or questions  ?call office if I gain more than 2 pounds in one day or 5 pounds in one week ?keep legs up while sitting ?use salt in moderation ?watch for swelling in feet, ankles and legs every day ?weigh myself daily ?follow rescue plan if symptoms flare-up ?eat more whole grains, fruits and vegetables, lean meats and healthy fats ?keep appointment with eye doctor ?check blood sugar at prescribed times: twice daily and when you have symptoms of low or high blood sugar ?check feet daily for cuts, sores or redness ?drink 6 to 8 glasses of water each day ?fill half of plate with vegetables ?manage portion size ?switch to low-fat or skim milk ?switch to sugar-free drinks ?keep feet up while sitting ?wear comfortable, cotton socks ?check blood pressure daily ?choose a place to take my blood pressure (home, clinic or office, retail store) ?take blood pressure log to all doctor appointments ?take medications for blood pressure exactly as prescribed ?eat more whole grains, fruits and vegetables, lean meats and healthy fats ?limit salt intake to 2300mg /day ?call for medicine refill 2 or 3 days before it runs out ?take all medications exactly as prescribed ?call doctor with any symptoms you believe are related to your medicine ? ?Our next appointment is by telephone on 11/08/21 at 3 PM ? ?Please call the care guide team at [phone] if you need to cancel or reschedule your appointment.  ? ?If you are experiencing a Mental Health or Behavioral Health Crisis or need someone to talk to,  please call the Suicide and Crisis Lifeline: 988 ?call the 07-01-1986 National Suicide Prevention Lifeline: 8432359728 or TTY: 279-516-4925 TTY (850) 177-8956) to talk to a trained counselor ?call 1-800-273-TALK (toll free, 24 hour hotline) ?go to [facility] [address] [place] [phone]) ?call 911  ? ?Patient verbalizes understanding of instructions and care plan provided today and agrees to view in MyChart. Active MyChart status confirmed with patient.   ?([phone] RN, BSN,CCM, CDE ?Care Management Coordinator ?[facility] ?[phone] Dudley Major   ?

## 2021-08-30 NOTE — Chronic Care Management (AMB) (Signed)
Chronic Care Management   CCM RN Visit Note  08/30/2021 Name: Kayla Brown MRN: [id number] DOB: 1950/07/17  Subjective: Kayla Brown is a 71 y.o. year old female who is a primary care patient of Birdie Riddle, [name], MD. The care management team was consulted for assistance with disease management and care coordination needs.    Engaged with patient by telephone for follow up visit in response to provider referral for case management and/or care coordination services.   Consent to Services:  The patient was given information about Chronic Care Management services, agreed to services, and gave verbal consent prior to initiation of services.  Please see initial visit note for detailed documentation.   Patient agreed to services and verbal consent obtained.   Assessment: Review of patient past medical history, allergies, medications, health status, including review of consultants reports, laboratory and other test data, was performed as part of comprehensive evaluation and provision of chronic care management services.   SDOH (Social Determinants of Health) assessments and interventions performed:    CCM Care Plan  No Known Allergies  Outpatient Encounter Medications as of 08/30/2021  Medication Sig Note   albuterol (VENTOLIN HFA) 108 (90 Base) MCG/ACT inhaler Inhale 1-2 puffs into the lungs every 6 (six) hours as needed for wheezing or shortness of breath.    Alcohol Swabs (ALCOHOL PREP) PADS Pt uses an alcohol pad each time sugars are tested. Pt tests twice daily. Dx E11.9    amLODipine (NORVASC) 10 MG tablet TAKE 1 TABLET BY MOUTH EVERY DAY    atorvastatin (LIPITOR) 40 MG tablet TAKE 1 TABLET BY MOUTH EVERY DAY    B Complex Vitamins (B COMPLEX PO) Take 1 tablet by mouth daily.    buPROPion (WELLBUTRIN XL) 150 MG 24 hr tablet TAKE 1 TABLET BY MOUTH EVERY DAY    Calcium Carb-Cholecalciferol [phone] MG-UNIT CAPS Take 1 tablet by mouth 2 (two) times daily.    carvedilol (COREG) 25 MG  tablet TAKE 1 TABLET BY MOUTH TWICE A DAY    Chromium Picolinate 800 MCG TABS Take 1 tablet by mouth daily.    dapagliflozin propanediol (FARXIGA) 10 MG TABS tablet Take 10 mg by mouth daily. 01/25/2021: Gets through patient assistance.    fish oil-omega-3 fatty acids 1000 MG capsule Take 1 g by mouth daily.    fluticasone (FLONASE) 50 MCG/ACT nasal spray SPRAY 2 SPRAYS INTO EACH NOSTRIL EVERY DAY (Patient taking differently: Place 2 sprays into both nostrils daily.)    furosemide (LASIX) 20 MG tablet TAKE 1 TABLET BY MOUTH EVERY DAY    gabapentin (NEURONTIN) 100 MG capsule Take 100 mg by mouth 2 (two) times daily.    glipiZIDE (GLUCOTROL) 5 MG tablet Take 2 tabs at breakfast and 2 tabs at supper.    glucose blood (FREESTYLE LITE) test strip USE TO TEST BLOOD GLUCOSE 2 TIMES DAILY. Dx E11.9    loratadine (CLARITIN) 10 MG tablet TAKE 1 TABLET BY MOUTH EVERY DAY (Patient taking differently: Take 10 mg by mouth daily.)    Melatonin 3 MG TABS Take 10 mg by mouth at bedtime.     methocarbamol (ROBAXIN) 500 MG tablet Take 500 mg by mouth 2 (two) times daily.    Multiple Vitamins-Minerals (VISION FORMULA) TABS Take 1 tablet by mouth daily.    OVER THE COUNTER MEDICATION Take 1 tablet by mouth daily. Equate Vision Formula 50+    prednisoLONE acetate (PRED FORTE) 1 % ophthalmic suspension SMARTSIG:In Eye(s)    Semaglutide,0.25 or 0.5MG/DOS, (OZEMPIC,  0.25 OR 0.5 MG/DOSE,) 2 MG/1.5ML SOPN Inject 0.25 mg into the skin for 4 weeks, then increase to 0.74m once weekly.    sertraline (ZOLOFT) 50 MG tablet TAKE 1 TABLET BY MOUTH EVERY DAY    traMADol (ULTRAM) 50 MG tablet Take 1 tablet (50 mg total) by mouth every 6 (six) hours as needed.    TRUEPLUS LANCETS 30G MISC Use one lancet each time sugars are tested. Pt tests twice daily. Dx. E11.9    UNABLE TO FIND Med Name: Healthwise talking meter    No facility-administered encounter medications on file as of 08/30/2021.    Patient Active Problem List   Diagnosis  Date Noted   Reactive airway disease 01/30/2021   Morbid obesity ([facility] 11/18/2020   Osteopenia 11/18/2020   Type 2 diabetes mellitus with stage 3a chronic kidney disease, without long-term current use of insulin ([facility] 07/21/2020   Type 2 diabetes mellitus with hyperglycemia, with long-term current use of insulin ([facility] 04/19/2020   Lumbar radiculopathy 03/02/2020   Left hip pain 12/01/2019   Type 2 diabetes mellitus with stage 3b chronic kidney disease, without long-term current use of insulin ([facility] 116/03/9603  Diastolic CHF ([facility] 054/02/8118  Dyspnea 03/08/2019   Pulmonary edema 03/04/2019   Physical exam 02/06/2017   Anxiety and depression 12/05/2016   Obesity (BMI 30-39.9) 03/01/2016   OSA (obstructive sleep apnea) 07/11/2015   Pulmonary hypertension ([facility] 05/06/2015   Heart murmur 03/03/2015   Essential hypertension, benign 03/18/2014   Hyperlipidemia 03/18/2014   Post-menopause on HRT (hormone replacement therapy) 03/18/2014    Conditions to be addressed/monitored:CHF, HTN, HLD, DMII, CKD Stage 3, and chronic pain  Care Plan : RN Care Manager Plan of Care  Updates made by SDimitri Ped RN since 08/30/2021 12:00 AM     Problem: Chronic Disease Management and Care Coordination Needs (DM, CHF, CKD,HTN and HLD)   Priority: High     Long-Range Goal: Establish Plan of Care for Chronic Disease Management Needs (DM, CHF, CKD,HTN and HLD)   Start Date: 06/14/2021  Expected End Date: 06/11/2022  Priority: High  Note:   Current Barriers:  Knowledge Deficits related to plan of care for management of CHF, HTN, HLD, DMII, and CKD Stage 3  Care Coordination needs related to Financial constraints related to cost of medications Chronic Disease Management support and education needs related to CHF, HTN, HLD, DMII, and CKD Stage 3  States that she is now up the the 0.5 dose of Ozempic. States she is not having any side effects with the increased dose. States she did get an injection  in her back last week and her CBGs have been going up and down States her CBGs have been getting better with morning ranges 119-224 and before supper 150-252. States she has been trying to eat less and she is feeling fuller with less food. States she is trying to only have one CHO with protein at meals and snacks. States she is working on portion control.  States she does not drink anything with sugar and her diet drinks are clear.   States her weight has been stable and has not needed to take any extra Lasix since the first of the year Denies any swelling, chest pains or shortness of breath.  States her B/P has been stable.  States her back pain has been keeping her from walking much but she plans to start walking again since her back injection.   RNCM Clinical Goal(s):  Patient will verbalize  understanding of plan for management of CHF, HTN, HLD, DMII, and CKD Stage 3 as evidenced by voiced adherence to plan of care verbalize basic understanding of  CHF, HTN, HLD, DMII, and CKD Stage 3 disease process and self health management plan as evidenced by voiced understanding and teach back take all medications exactly as prescribed and will call provider for medication related questions as evidenced by dispense report and pt verbalization attend all scheduled medical appointments: Nephrology 09/08/21,CCM PharmD 09/27/21,cardiology 12/20/21, Pulmonary 07/05/22, Dr. Birdie Riddle 10/24/21 as evidenced by medical records demonstrate Improved adherence to prescribed treatment plan for CHF, HTN, HLD, DMII, and CKD Stage 3 as evidenced by readings within limits, voiced adherence to plan of care continue to work with RN Care Manager to address care management and care coordination needs related to  CHF, HTN, HLD, DMII, and CKD Stage 3 as evidenced by adherence to CM Team Scheduled appointments work with pharmacist to address Financial constraints related to cost of medications related toCHF, HTN, HLD, DMII, and CKD Stage 3 as  evidenced by review or EMR and patient or pharmacist report through collaboration with RN Care manager, provider, and care team.   Interventions: 1:1 collaboration with primary care provider regarding development and update of comprehensive plan of care as evidenced by provider attestation and co-signature Inter-disciplinary care team collaboration (see longitudinal plan of care) Evaluation of current treatment plan related to  self management and patient's adherence to plan as established by provider   Heart Failure Interventions:  (Status:  Goal on track:  Yes.) Long Term Goal Provided education on low sodium diet Reviewed Heart Failure Action Plan in depth and provided written copy Discussed importance of daily weight and advised patient to weigh and record daily Reviewed role of diuretics in prevention of fluid overload and management of heart failure; Discussed the importance of keeping all appointments with provider Reinforced to take Lasix as needed for weight gain   Chronic Kidney Disease Interventions:  (Status:  Goal on track:  Yes.) Long Term Goal Evaluation of current treatment plan related to chronic kidney disease self management and patient's adherence to plan as established by provider      Reviewed prescribed diet low sodium low CHO Reviewed medications with patient and discussed importance of compliance    Discussed complications of poorly controlled blood pressure such as heart disease, stroke, circulatory complications, vision complications, kidney impairment, sexual dysfunction    Discussed plans with patient for ongoing care management follow up and provided patient with direct contact information for care management team    Provided education on kidney disease progression    Reinforced importance of keep blood sugars under control to help with kidney function Last practice recorded BP readings:  BP Readings from Last 3 Encounters:  07/04/21 130/60  06/23/21 128/60   05/22/21 130/80  Most recent eGFR/CrCl: No results found for: EGFR  No components found for: CRCL    Diabetes Interventions:  (Status:  Goal on track:  Yes.) Long Term Goal Assessed patient's understanding of A1c goal: <7% Provided education to patient about basic DM disease process Reviewed medications with patient and discussed importance of medication adherence Counseled on importance of regular laboratory monitoring as prescribed Discussed plans with patient for ongoing care management follow up and provided patient with direct contact information for care management team Advised patient, providing education and rationale, to check cbg twice daily and record, calling provider for findings outside established parameters Review of patient status, including review of consultants reports, relevant  laboratory and other test results, and medications completed Reviewed to start walking again  2-3 times a week as her pain allows. Reinforced how exercise can help lower blood sugars. Reinforced  to eat healthy protein snacks and to limit her CHO intake.  Reviewed fasting blood sugar goals of 80-130 and less than 180 1 1/2-2 hours after meals. Reviewed tips on how to limit portions Lab Results  Component Value Date   HGBA1C 7.8 (A) 05/22/2021   Hyperlipidemia Interventions:  (Status:  Condition stable.  Not addressed this visit.) Long Term Goal Medication review performed; medication list updated in electronic medical record.  Counseled on importance of regular laboratory monitoring as prescribed Reviewed role and benefits of statin for ASCVD risk reduction Reviewed importance of limiting foods high in cholesterol Reviewed exercise goals and target of 150 minutes per week  Hypertension Interventions:  (Status:  Goal on track:  Yes.) Long Term Goal Last practice recorded BP readings:  BP Readings from Last 3 Encounters:  07/04/21 130/60  06/23/21 128/60  05/22/21 130/80  Most recent  eGFR/CrCl: No results found for: EGFR  No components found for: CRCL  Evaluation of current treatment plan related to hypertension self management and patient's adherence to plan as established by provider Provided education to patient re: stroke prevention, s/s of heart attack and stroke Reviewed medications with patient and discussed importance of compliance Discussed plans with patient for ongoing care management follow up and provided patient with direct contact information for care management team Advised patient, providing education and rationale, to monitor blood pressure daily and record, calling PCP for findings outside established parameters Discussed complications of poorly controlled blood pressure such as heart disease, stroke, circulatory complications, vision complications, kidney impairment, sexual dysfunction Reviewed to follow a low sodium low CHO diet   Pain Interventions:  (Status:  New goal.) Long Term Goal Pain assessment performed Medications reviewed Reviewed provider established plan for pain management Discussed importance of adherence to all scheduled medical appointments Counseled on the importance of reporting any/all new or changed pain symptoms or management strategies to pain management provider Advised patient to report to care team affect of pain on daily activities Discussed use of relaxation techniques and/or diversional activities to assist with pain reduction (distraction, imagery, relaxation, massage, acupressure, TENS, heat, and cold application Reviewed with patient prescribed pharmacological and nonpharmacological pain relief strategies   Patient Goals/Self-Care Activities: Take all medications as prescribed Attend all scheduled provider appointments Call pharmacy for medication refills 3-7 days in advance of running out of medications Call provider office for new concerns or questions  call office if I gain more than 2 pounds in one day or 5 pounds  in one week keep legs up while sitting use salt in moderation watch for swelling in feet, ankles and legs every day weigh myself daily follow rescue plan if symptoms flare-up eat more whole grains, fruits and vegetables, lean meats and healthy fats keep appointment with eye doctor check blood sugar at prescribed times: twice daily and when you have symptoms of low or high blood sugar check feet daily for cuts, sores or redness drink 6 to 8 glasses of water each day fill half of plate with vegetables manage portion size switch to low-fat or skim milk switch to sugar-free drinks keep feet up while sitting wear comfortable, cotton socks check blood pressure daily choose a place to take my blood pressure (home, clinic or office, retail store) take blood pressure log to all doctor appointments take medications for  blood pressure exactly as prescribed eat more whole grains, fruits and vegetables, lean meats and healthy fats limit salt intake to 237m/day call for medicine refill 2 or 3 days before it runs out take all medications exactly as prescribed call doctor with any symptoms you believe are related to your medicine  Follow Up Plan:  Telephone follow up appointment with care management team member scheduled for:  11/08/21 The patient has been provided with contact information for the care management team and has been advised to call with any health related questions or concerns.       Plan:Telephone follow up appointment with care management team member scheduled for:  11/08/21 The patient has been provided with contact information for the care management team and has been advised to call with any health related questions or concerns.  MPeter GarterRN, BJackquline Denmark CDE Care Management Coordinator [facility] ([phone]

## 2021-09-05 ENCOUNTER — Telehealth: Payer: Self-pay | Admitting: Internal Medicine

## 2021-09-05 ENCOUNTER — Encounter: Payer: Self-pay | Admitting: Internal Medicine

## 2021-09-05 NOTE — Telephone Encounter (Signed)
FYI, ? ?Patient called and stated she is receiveing care from a new Endocrinologist. Patient states she does not want to (re)schedule appointment(s). ?

## 2021-09-08 ENCOUNTER — Telehealth: Payer: Self-pay | Admitting: Internal Medicine

## 2021-09-08 DIAGNOSIS — E1122 Type 2 diabetes mellitus with diabetic chronic kidney disease: Secondary | ICD-10-CM | POA: Diagnosis not present

## 2021-09-08 DIAGNOSIS — E785 Hyperlipidemia, unspecified: Secondary | ICD-10-CM | POA: Diagnosis not present

## 2021-09-08 DIAGNOSIS — I129 Hypertensive chronic kidney disease with stage 1 through stage 4 chronic kidney disease, or unspecified chronic kidney disease: Secondary | ICD-10-CM | POA: Diagnosis not present

## 2021-09-08 DIAGNOSIS — I5032 Chronic diastolic (congestive) heart failure: Secondary | ICD-10-CM | POA: Diagnosis not present

## 2021-09-08 DIAGNOSIS — N1831 Chronic kidney disease, stage 3a: Secondary | ICD-10-CM | POA: Diagnosis not present

## 2021-09-08 DIAGNOSIS — N390 Urinary tract infection, site not specified: Secondary | ICD-10-CM | POA: Diagnosis not present

## 2021-09-08 NOTE — Telephone Encounter (Signed)
Patient dismissed from [facility] by Lesly Dukes, MD, effective 09/05/21. Dismissal Letter sent out by 1st class mail. [employer]   ?

## 2021-09-11 DIAGNOSIS — Z20822 Contact with and (suspected) exposure to covid-19: Secondary | ICD-10-CM | POA: Diagnosis not present

## 2021-09-18 ENCOUNTER — Encounter: Payer: Self-pay | Admitting: Registered Nurse

## 2021-09-18 ENCOUNTER — Ambulatory Visit (INDEPENDENT_AMBULATORY_CARE_PROVIDER_SITE_OTHER): Payer: Medicare Other | Admitting: Registered Nurse

## 2021-09-18 VITALS — BP 136/70 | HR 63 | Temp 98.1°F | Resp 16 | Wt 174.4 lb

## 2021-09-18 DIAGNOSIS — N1832 Chronic kidney disease, stage 3b: Secondary | ICD-10-CM | POA: Diagnosis not present

## 2021-09-18 DIAGNOSIS — E1122 Type 2 diabetes mellitus with diabetic chronic kidney disease: Secondary | ICD-10-CM

## 2021-09-18 LAB — BASIC METABOLIC PANEL
BUN: 44 mg/dL — ABNORMAL HIGH (ref 6–23)
CO2: 25 mEq/L (ref 19–32)
Calcium: 9.6 mg/dL (ref 8.4–10.5)
Chloride: 106 mEq/L (ref 96–112)
Creatinine, Ser: 1.53 mg/dL — ABNORMAL HIGH (ref 0.40–1.20)
GFR: 34.51 mL/min — ABNORMAL LOW (ref >60.00)
Glucose, Bld: 209 mg/dL — ABNORMAL HIGH (ref 70–99)
Potassium: 4.2 mEq/L (ref 3.5–5.1)
Sodium: 139 mEq/L (ref 135–145)

## 2021-09-18 MED ORDER — SEMAGLUTIDE (1 MG/DOSE) 4 MG/3ML ~~LOC~~ SOPN
1.0000 mg | PEN_INJECTOR | SUBCUTANEOUS | 0 refills | Status: DC
Start: 2021-09-18 — End: 2022-04-24

## 2021-09-18 NOTE — Assessment & Plan Note (Signed)
Increase semaglutide to 1mg  subq weekly ?Discussed monitoring urine output and renal function symptoms ?Check bmp today. ?Return as scheduled in 5 weeks with PCP for recheck ?

## 2021-09-18 NOTE — Patient Instructions (Signed)
Kayla Brown -  ? ?Great to meet you ? ?Increase semaglutide to 1mg  weekly. ? ?Close eye on diet - I'd favor a half scoop or MiraLax rather than a FiberOne bar for constipation. ? ?Keep follow up appt with Dr.  ? ?Call if concerns arise sooner ? ?Thank you ? ?Rich  ?

## 2021-09-18 NOTE — Progress Notes (Signed)
? ?Established Patient Office Visit ? ?Subjective:  ?Patient ID: Kayla Brown, female    DOB: [birth year]  Age: 71 y.o. MRN: [id number] ? ?CC:  ?Chief Complaint  ?Patient presents with  ? Diabetes  ?  Patient states sugars are running very high and she can't get them down. Yesterday it was 172 she ate lunch and it jumped up to 278. This morning it was 272. It's hardly ever 130 or below  ? Labs Only  ?  Discuss kidney function labs  ? ? ?HPI ?Kayla Brown presents for t2dm, kidney function. ? ?T2dm ?Last A1c:  ?Lab Results  ?Component Value Date  ? HGBA1C 7.8 (A) 05/22/2021  ?  ?Currently taking: farxiga 67m po qd, glipizide1325mpo bid ac, semaglutide 0.25m39mubq once weekly,  ?Notes hyperglycemia. Postprandial up to high 200s.  ?Reports good compliance with medications ?Diet has been steady ?Exercise habits have been steady.  ? ?Had followed with Dr. ShaKelton Pillarno longer est with her.  ? ?Lab ?Wants to recheck renal function ?[facility]  ? ?Outpatient Medications Prior to Visit  ?Medication Sig Dispense Refill  ? albuterol (VENTOLIN HFA) 108 (90 Base) MCG/ACT inhaler Inhale 1-2 puffs into the lungs every 6 (six) hours as needed for wheezing or shortness of breath.    ? Alcohol Swabs (ALCOHOL PREP) PADS Pt uses an alcohol pad each time sugars are tested. Pt tests twice daily. Dx E11.9 100 each 3  ? amLODipine (NORVASC) 10 MG tablet TAKE 1 TABLET BY MOUTH EVERY DAY 90 tablet 1  ? atorvastatin (LIPITOR) 40 MG tablet TAKE 1 TABLET BY MOUTH EVERY DAY 90 tablet 1  ? B Complex Vitamins (B COMPLEX PO) Take 1 tablet by mouth daily.    ? buPROPion (WELLBUTRIN XL) 150 MG 24 hr tablet TAKE 1 TABLET BY MOUTH EVERY DAY 90 tablet 2  ? Calcium Carb-Cholecalciferol [phone] MG-UNIT CAPS Take 1 tablet by mouth 2 (two) times daily.    ? carvedilol (COREG) 25 MG tablet TAKE 1 TABLET BY MOUTH TWICE A DAY 180 tablet 3  ? dapagliflozin propanediol (FARXIGA) 10 MG TABS tablet Take 10 mg  by mouth daily.    ? fish oil-omega-3 fatty acids 1000 MG capsule Take 1 g by mouth daily.    ? fluticasone (FLONASE) 50 MCG/ACT nasal spray SPRAY 2 SPRAYS INTO EACH NOSTRIL EVERY DAY (Patient taking differently: Place 2 sprays into both nostrils daily.) 18 g 1  ? furosemide (LASIX) 20 MG tablet TAKE 1 TABLET BY MOUTH EVERY DAY 90 tablet 1  ? glipiZIDE (GLUCOTROL) 5 MG tablet Take 2 tabs at breakfast and 2 tabs at supper. (Patient taking differently: 10 mg. Take 2 tabs at breakfast and 2 tabs at supper.) 360 tablet 3  ? glucose blood (FREESTYLE LITE) test strip USE TO TEST BLOOD GLUCOSE 2 TIMES DAILY. Dx E11.9 100 each 12  ? loratadine (CLARITIN) 10 MG tablet TAKE 1 TABLET BY MOUTH EVERY DAY (Patient taking differently: Take 10 mg by mouth daily.) 30 tablet 11  ? Melatonin 3 MG TABS Take 10 mg by mouth at bedtime.     ? methocarbamol (ROBAXIN) 500 MG tablet Take 500 mg by mouth as needed.    ? Multiple Vitamins-Minerals (VISION FORMULA) TABS Take 1 tablet by mouth daily.    ? OVER THE COUNTER MEDICATION Take 1 tablet by mouth daily. Equate Vision Formula 50+    ? sertraline (ZOLOFT) 50 MG tablet TAKE 1  TABLET BY MOUTH EVERY DAY 90 tablet 1  ? traMADol (ULTRAM) 50 MG tablet Take 1 tablet (50 mg total) by mouth every 6 (six) hours as needed. 30 tablet 0  ? TRUEPLUS LANCETS 30G MISC Use one lancet each time sugars are tested. Pt tests twice daily. Dx. E11.9 100 each 3  ? UNABLE TO FIND Med Name: [employer] talking meter 1 kit 0  ? Semaglutide,0.25 or 0.5MG/DOS, (OZEMPIC, 0.25 OR 0.5 MG/DOSE,) 2 MG/1.5ML SOPN Inject 0.25 mg into the skin for 4 weeks, then increase to 0.57m once weekly. 3 mL 3  ? Chromium Picolinate 800 MCG TABS Take 1 tablet by mouth daily. (Patient not taking: Reported on 09/18/2021)    ? gabapentin (NEURONTIN) 100 MG capsule Take 100 mg by mouth 2 (two) times daily.    ? JANUVIA 50 MG tablet Take 50 mg by mouth daily. (Patient not taking: Reported on 09/18/2021)    ? prednisoLONE acetate (PRED FORTE) 1  % ophthalmic suspension SMARTSIG:In Eye(s)    ? TRADJENTA 5 MG TABS tablet Take 5 mg by mouth daily. (Patient not taking: Reported on 09/18/2021)    ? traMADol (ULTRAM) 50 MG tablet Take 1 tablet by mouth every 6 (six) hours as needed. (Patient not taking: Reported on 09/18/2021)    ? ?No facility-administered medications prior to visit.  ? ? ?Review of Systems  ?Constitutional: Negative.   ?HENT: Negative.    ?Eyes: Negative.   ?Respiratory: Negative.    ?Cardiovascular: Negative.   ?Gastrointestinal: Negative.   ?Genitourinary: Negative.   ?Musculoskeletal: Negative.   ?Skin: Negative.   ?Neurological: Negative.   ?Psychiatric/Behavioral: Negative.    ?All other systems reviewed and are negative. ? ?  ?Objective:  ?  ? ?BP 136/70   Pulse 63   Temp 98.1 ?F (36.7 ?C)   Resp 16   Wt 174 lb 6.4 oz (79.1 kg)   LMP 06/25/2008   SpO2 96%   BMI 35.22 kg/m?  ? ?Wt Readings from Last 3 Encounters:  ?09/18/21 174 lb 6.4 oz (79.1 kg)  ?07/04/21 181 lb 6.4 oz (82.3 kg)  ?06/23/21 180 lb 3.2 oz (81.7 kg)  ? ?Physical Exam ?Vitals and nursing note reviewed.  ?Constitutional:   ?   General: She is not in acute distress. ?   Appearance: Normal appearance. She is normal weight. She is not ill-appearing, toxic-appearing or diaphoretic.  ?Cardiovascular:  ?   Rate and Rhythm: Normal rate and regular rhythm.  ?   Heart sounds: Normal heart sounds. No murmur heard. ?  No friction rub. No gallop.  ?Pulmonary:  ?   Effort: Pulmonary effort is normal. No respiratory distress.  ?   Breath sounds: Normal breath sounds. No stridor. No wheezing, rhonchi or rales.  ?Chest:  ?   Chest wall: No tenderness.  ?Skin: ?   General: Skin is warm and dry.  ?Neurological:  ?   General: No focal deficit present.  ?   Mental Status: She is alert and oriented to person, place, and time. Mental status is at baseline.  ?Psychiatric:     ?   Mood and Affect: Mood normal.     ?   Behavior: Behavior normal.     ?   Thought Content: Thought content normal.      ?   Judgment: Judgment normal.  ? ? ?No results found for any visits on 09/18/21. ? ?Last metabolic panel ?Lab Results  ?Component Value Date  ? GLUCOSE 147 (H) 05/09/2021  ?  NA 140 05/09/2021  ? K 4.2 05/09/2021  ? CL 104 05/09/2021  ? CO2 27 05/09/2021  ? BUN 36 (H) 05/09/2021  ? CREATININE 1.61 (H) 05/09/2021  ? GFRNONAA 48 (L) 11/09/2019  ? CALCIUM 10.0 05/09/2021  ? PHOS 3.9 03/15/2019  ? PROT 6.7 04/26/2021  ? ALBUMIN 4.1 04/26/2021  ? BILITOT 0.3 04/26/2021  ? ALKPHOS 58 04/26/2021  ? AST 14 04/26/2021  ? ALT 20 04/26/2021  ? ANIONGAP 10 03/15/2019  ? ?Last hemoglobin A1c ?Lab Results  ?Component Value Date  ? HGBA1C 7.8 (A) 05/22/2021  ? ?  ? ?The ASCVD Risk score (Arnett DK, et al., 2019) failed to calculate for the following reasons: ?  The valid total cholesterol range is 130 to 320 mg/dL ? ?  ?Assessment & Plan:  ? ?Problem List Items Addressed This Visit   ? ?  ? Endocrine  ? Type 2 diabetes mellitus with stage 3b chronic kidney disease, without long-term current use of insulin ([place]) - Primary  ?  Increase semaglutide to 19m subq weekly ?Discussed monitoring urine output and renal function symptoms ?Check bmp today. ?Return as scheduled in 5 weeks with PCP for recheck ?  ?  ? Relevant Medications  ? Semaglutide, 1 MG/DOSE, 4 MG/3ML SOPN  ? Other Relevant Orders  ? Basic Metabolic Panel (BMET)  ? ? ?Meds ordered this encounter  ?Medications  ? Semaglutide, 1 MG/DOSE, 4 MG/3ML SOPN  ?  Sig: Inject 1 mg as directed once a week.  ?  Dispense:  9 mL  ?  Refill:  0  ?  Order Specific Question:   Supervising Provider  ?  Answer:   GCarlota Raspberry JEFFREY R [[id number]]  ? ? ?Return if symptoms worsen or fail to improve, for as scheduled with PCP. .  ? ? ?RMaximiano Coss NP ?

## 2021-09-20 ENCOUNTER — Ambulatory Visit: Payer: Medicare Other | Admitting: Internal Medicine

## 2021-09-22 DIAGNOSIS — E785 Hyperlipidemia, unspecified: Secondary | ICD-10-CM

## 2021-09-22 DIAGNOSIS — I5032 Chronic diastolic (congestive) heart failure: Secondary | ICD-10-CM

## 2021-09-22 DIAGNOSIS — I10 Essential (primary) hypertension: Secondary | ICD-10-CM | POA: Diagnosis not present

## 2021-09-22 DIAGNOSIS — E1122 Type 2 diabetes mellitus with diabetic chronic kidney disease: Secondary | ICD-10-CM | POA: Diagnosis not present

## 2021-09-22 DIAGNOSIS — N1832 Chronic kidney disease, stage 3b: Secondary | ICD-10-CM | POA: Diagnosis not present

## 2021-09-25 NOTE — Progress Notes (Signed)
? ? ?Chronic Care Management ?Pharmacy Note ? ?09/27/2021 ?Name:  Kayla Brown MRN:  [id number] DOB:  Nov 16, 1950 ? ?Subjective: ?Kayla Brown is an 71 y.o. year old female who is a primary patient of [name], [name], MD.  The CCM team was consulted for assistance with disease management and care coordination needs.   ? ?Engaged with patient by telephone for follow up visit in response to provider referral for pharmacy case management and/or care coordination services.  ? ?Consent to Services:  ?The patient was given information about Chronic Care Management services, agreed to services, and gave verbal consent prior to initiation of services.  Please see initial visit note for detailed documentation.  ? ?Patient Care Team: ?Kayla Minium, MD as PCP - General (Family Medicine) ?[name], MD (Inactive) as Consulting Physician (Gastroenterology) ?[name], MD as Consulting Physician (Gynecology) ?[name], RN as Case Manager ?Edythe Clarity, [facility] (Pharmacist) ? ?Objective: ? ?Lab Results  ?Component Value Date  ? CREATININE 1.53 (H) 09/18/2021  ? CREATININE 1.61 (H) 05/09/2021  ? CREATININE 1.60 (H) 04/26/2021  ? ? ?Lab Results  ?Component Value Date  ? HGBA1C 7.8 (A) 05/22/2021  ? ?Last diabetic Eye exam:  ?Lab Results  ?Component Value Date/Time  ? HMDIABEYEEXA No Retinopathy 08/01/2020 03:45 PM  ?  ?Last diabetic Foot exam:  ?Lab Results  ?Component Value Date/Time  ? HMDIABFOOTEX completed 01/24/2016 12:00 AM  ?  ? ?   ?Component Value Date/Time  ? CHOL 126 04/26/2021 0908  ? TRIG 77.0 04/26/2021 0908  ? HDL 54.30 04/26/2021 0908  ? CHOLHDL 2 04/26/2021 0908  ? VLDL 15.4 04/26/2021 0908  ? [place] 56 04/26/2021 0908  ? [place] 52 11/18/2020 1436  ? LDLDIRECT 50.0 05/23/2020 1431  ? ? ? ?  Latest Ref Rng & Units 04/26/2021  ?  9:08 AM 11/18/2020  ?  2:36 PM 05/23/2020  ?  2:31 PM  ?Hepatic Function  ?Total Protein 6.0 - 8.3 g/dL 6.7   6.4   6.6    ?Albumin 3.5 - 5.2 g/dL 4.1     4.3    ?AST 0 - 37 U/L 14   15   18     ?ALT 0 - 35 U/L 20   20   29     ?Alk Phosphatase 39 - 117 U/L 58    61    ?Total Bilirubin 0.2 - 1.2 mg/dL 0.3   0.3   0.3    ?Bilirubin, Direct 0.0 - 0.3 mg/dL 0.1   0.1   0.1    ? ? ?Lab Results  ?Component Value Date/Time  ? TSH 1.59 04/26/2021 09:08 AM  ? TSH 1.19 11/18/2020 02:36 PM  ? ? ? ?  Latest Ref Rng & Units 04/26/2021  ?  9:08 AM 11/18/2020  ?  2:36 PM 05/23/2020  ?  2:31 PM  ?CBC  ?WBC 4.0 - 10.5 K/uL 7.9   10.5   10.0    ?Hemoglobin 12.0 - 15.0 g/dL 12.8   11.7   13.0    ?Hematocrit 36.0 - 46.0 % 39.2   35.7   39.4    ?Platelets 150.0 - 400.0 K/uL 211.0   268   287.0    ? ? ?No results found for: VD25OH ? ?Clinical ASCVD:  ?The ASCVD Risk score (Arnett DK, et al., 2019) failed to calculate for the following reasons: ?  The valid total cholesterol range is 130 to 320 mg/dL   ?Social History  ? ?  Tobacco Use  ?Smoking Status Never  ?Smokeless Tobacco Never  ? ?BP Readings from Last 3 Encounters:  ?09/18/21 136/70  ?07/04/21 130/60  ?06/23/21 128/60  ? ?Pulse Readings from Last 3 Encounters:  ?09/18/21 63  ?07/04/21 (!) 53  ?06/23/21 (!) 48  ? ?Wt Readings from Last 3 Encounters:  ?09/18/21 174 lb 6.4 oz (79.1 kg)  ?07/04/21 181 lb 6.4 oz (82.3 kg)  ?06/23/21 180 lb 3.2 oz (81.7 kg)  ? ? ?Assessment: Review of patient past medical history, allergies, medications, health status, including review of consultants reports, laboratory and other test data, was performed as part of comprehensive evaluation and provision of chronic care management services.  ? ?SDOH:  (Social Determinants of Health) assessments and interventions performed: Yes ? ? ?[place] ? ?No Known Allergies ? ?Medications Reviewed Today   ? ? Reviewed by Edythe Clarity, RPH (Pharmacist) on 09/27/21 at 1556  Med List Status: <None>  ? ?Medication Order Taking? Sig Documenting Provider Last Dose Status Informant  ?albuterol (VENTOLIN HFA) 108 (90 Base) MCG/ACT inhaler [id number] Yes Inhale 1-2 puffs  into the lungs every 6 (six) hours as needed for wheezing or shortness of breath. [provider] Taking Active   ?Alcohol Swabs (ALCOHOL PREP) PADS [id number] Yes Pt uses an alcohol pad each time sugars are tested. Pt tests twice daily. Dx E11.9 Kayla Minium, MD Taking Active Spouse/Significant Other  ?amLODipine (NORVASC) 10 MG tablet [id number] Yes TAKE 1 TABLET BY MOUTH EVERY DAY Kayla Minium, MD Taking Active   ?atorvastatin (LIPITOR) 40 MG tablet [id number] Yes TAKE 1 TABLET BY MOUTH EVERY DAY Kayla Minium, MD Taking Active   ?B Complex Vitamins (B COMPLEX PO) [id number] Yes Take 1 tablet by mouth daily. [provider] Taking Active   ?buPROPion (WELLBUTRIN XL) 150 MG 24 hr tablet [id number] Yes TAKE 1 TABLET BY MOUTH EVERY DAY Kayla Minium, MD Taking Active   ?Calcium Carb-Cholecalciferol [phone] MG-UNIT CAPS [id number] Yes Take 1 tablet by mouth 2 (two) times daily. [provider] Taking Active   ?carvedilol (COREG) 25 MG tablet [id number] Yes TAKE 1 TABLET BY MOUTH TWICE A DAY [name], [name], MD Taking Active   ?dapagliflozin propanediol (FARXIGA) 10 MG TABS tablet [id number] Yes Take 10 mg by mouth daily. [provider] Taking Active Self  ?         ?Med Note [name]   Wed Jan 25, 2021  9:18 AM) Gets through patient assistance.   ?fish oil-omega-3 fatty acids 1000 MG capsule [id number] Yes Take 1 g by mouth daily. [provider] Taking Active Spouse/Significant Other  ?fluticasone (FLONASE) 50 MCG/ACT nasal spray [id number] Yes SPRAY 2 SPRAYS INTO EACH NOSTRIL EVERY DAY  ?Patient taking differently: Place 2 sprays into both nostrils daily.  ? Kayla Minium, MD Taking Active   ?furosemide (LASIX) 20 MG tablet [id number] Yes TAKE 1 TABLET BY MOUTH EVERY DAY Kayla Minium, MD Taking Active   ?glipiZIDE (GLUCOTROL) 5 MG tablet [id number] Yes Take 2 tabs at breakfast and 2 tabs at supper.  ?Patient taking differently: 10 mg. Take  2 tabs at breakfast and 2 tabs at supper.  ? Shamleffer, Melanie Crazier, MD Taking Active   ?glucose blood (FREESTYLE LITE) test strip [id number] Yes USE TO TEST BLOOD GLUCOSE 2 TIMES DAILY. Dx E11.9 Kayla Minium, MD Taking Active Spouse/Significant Other  ?loratadine (CLARITIN) 10 MG tablet [id number] Yes TAKE 1 TABLET BY MOUTH EVERY DAY  ?Patient taking differently:  Take 10 mg by mouth daily.  ? Kayla Minium, MD Taking Active   ?Melatonin 3 MG TABS [id number] Yes Take 10 mg by mouth at bedtime.  [provider] Taking Active Spouse/Significant Other  ?methocarbamol (ROBAXIN) 500 MG tablet [id number] Yes Take 500 mg by mouth as needed. [provider] Taking Active   ?Multiple Vitamins-Minerals (VISION FORMULA) TABS [id number] Yes Take 1 tablet by mouth daily. [provider] Taking Active Spouse/Significant Other  ?OVER THE COUNTER MEDICATION [id number] Yes Take 1 tablet by mouth daily. Equate Vision Formula 50+ [provider] Taking Active Spouse/Significant Other  ?Semaglutide, 1 MG/DOSE, 4 MG/3ML SOPN [id number] Yes Inject 1 mg as directed once a week. Maximiano Coss, NP Taking Active   ?sertraline (ZOLOFT) 50 MG tablet [id number] Yes TAKE 1 TABLET BY MOUTH EVERY DAY Kayla Minium, MD Taking Active   ?traMADol (ULTRAM) 50 MG tablet [id number] Yes Take 1 tablet (50 mg total) by mouth every 6 (six) hours as needed. Aundra Dubin, PA-C Taking Active   ?TRUEPLUS LANCETS 30G MISC [id number] Yes Use one lancet each time sugars are tested. Pt tests twice daily. Dx. E11.9 Kayla Minium, MD Taking Active Spouse/Significant Other  ?UNABLE TO FIND [id number] Yes Med Name: [employer] talking meter Shamleffer, Melanie Crazier, MD Taking Active   ? ?  ?  ? ?  ? ? ?Patient Active Problem List  ? Diagnosis Date Noted  ? Reactive airway disease 01/30/2021  ? Morbid obesity ([place]) 11/18/2020  ? Osteopenia 11/18/2020  ? Type 2 diabetes mellitus with stage 3a chronic kidney  disease, without long-term current use of insulin ([place]) 07/21/2020  ? Type 2 diabetes mellitus with hyperglycemia, with long-term current use of insulin ([place]) 04/19/2020  ? Lumbar radiculopathy 09/08/202

## 2021-09-27 ENCOUNTER — Ambulatory Visit (INDEPENDENT_AMBULATORY_CARE_PROVIDER_SITE_OTHER): Payer: Medicare Other | Admitting: Pharmacist

## 2021-09-27 DIAGNOSIS — I5032 Chronic diastolic (congestive) heart failure: Secondary | ICD-10-CM

## 2021-09-27 DIAGNOSIS — N1832 Chronic kidney disease, stage 3b: Secondary | ICD-10-CM

## 2021-09-27 NOTE — Patient Instructions (Addendum)
Visit Information ? ? Goals Addressed   ? ?  ?  ?  ?  ? This Visit's Progress  ?  A1c <7%   On track  ?  CARE PLAN ENTRY ?Diabetes: type 2 ?Lab Results  ?Component Value Date  ? HGBA1C 7.3 (H) 11/19/2019  ? ?Lab Results  ?Component Value Date  ? CREATININE 1.08 11/19/2019  ? CREATININE 1.17 (H) 11/09/2019  ? CREATININE 1.15 05/25/2019  ?Current antihyperglycemic regimen: ?Metformin XL 500 mg two tabs (1000 mg) twice daily ?Glipizide IR 5 mg every morning before breakfast, 5 mg before supper  ? ?Pharmacist Clinical Goal(s):  ?Over the next 180 days, patient will work with PharmD and primary care provider as needed to achieve A1c<7% ?PAP application coordination on Farxiga ? ?Interventions: ?Comprehensive medication review performed, medication list updated in electronic medical record ?Inter-disciplinary care team collaboration (see longitudinal plan of care) ? ?Patient Self Care Activities:  ?Reduce consumption of sweets ?Patient will check blood glucose every morning, document, and provide at future appointments ?Patient will focus on medication adherence by continuing current medication management  ?Patient will take medications as prescribed ?Patient will contact provider with any episodes of hypoglycemia ?Patient will report any questions or concerns to provider  ? ?Initial goal documentation and Please see past updates related to this goal by clicking on the "Past Updates" button in the selected goal . ? ?06/28/21 - Counseled extensively on diet changes to follow up in 1 week ?  ? ?  ? ? ?Long-Range Goal: Disease Management   ?Start Date: 08/25/2020  ?Expected End Date: 08/25/2021  ?Recent Progress: On track  ?Priority: High  ?Note:   ?Current Barriers:  ?Unable to independently afford treatment regimen ?Unable tomaintain control of DM ? ?Pharmacist Clinical Goal(s):  ?Patient will contact provider office for questions/concerns as evidenced notation of same in electronic health record through collaboration with  PharmD and provider.  ? ?Interventions: ?1:1 collaboration with Sheliah Hatch, MD regarding development and update of comprehensive plan of care as evidenced by provider attestation and co-signature ?Inter-disciplinary care team collaboration (see longitudinal plan of care) ?Comprehensive medication review performed; medication list updated in electronic medical record ? ?CHF: (goal: minimize symptoms, ensure appropriate medication use) ?-Controlled ?-Continues with daily weights. Denies frequent weight gain >3lb, has not taken additional dose past 1-2 months.  ?-Current treatment: ?Furosemide 20 mg once daily Appropriate, Effective, Safe, Accessible  ?Carvedilol 25 mg twice daily Appropriate, Effective, Safe, Accessible ?-October: 174.2, 175.0, 174.2, 175.2  ?-Educated on appropriate use of furosemide ?-Counseled on diet and exercise extensively ?Recommended to continue current medication ? ?Update 06/28/21 ?Continues to check her weights daily and monitor for swelling. ?She denies any SOB at this point, using her albuterol on average of once per day. ?Denies any swelling or weight gain. ?Appears euvolemic today in office. ?Continue current meds - continue to monitor weight and contact providers with noticeable swelling or shortness of breath. ? ?Update 09/27/21 ?She has lost some weight, trying to walk daily. ?She is monitoring for swelling and has not noticed any recently. ?Continues to take meds as directed. ?No changes at this time. ? ?Depression (Goal: minimize symptoms and medication related side effects) ?-Controlled ?-Again reports a very positive response to bupropion in terms of mood and attentiveness, feels that anxiety continues to be a challenge so may want to explore additional options in the future here. ?-PHQ 3 (April 05 2021) ?-GAD7 10 (April 05 2021) ?-Current treatment: ?Sertraline 50 mg once daily ?Bupropion 150  mg XL once daily  ?-PHQ9: 0 (August 2022) ?-Educated on Benefits of  medication for symptom control  ?Recommended to continue current medication ?Could consider sertraline increase to 100 mg or addition of buspar for ongoing anxiety  ?BH declined at this time  ? ?Diabetes (A1c goal <7%) ?-Not ideally controlled ?-Now getting Farxiga Rx through PAP sent to Dr Beaulah Corin office.  ?-Temporary increase in FBGs following birthday weekend celebration.  ?-Current medications: ?Farxiga 10 mg once daily Appropriate, Effective, Safe, Accessible ?Glipizide 5 mg twice daily before a meal - Appropriate, Query effective ?Ozempic 0.5mg  once weekly Appropriate, Query effective, , ?-Medications previously tried:   ?-Current home glucose readings:  ?October 1st-12th 2022 - 138, 178 (after birthday), 165, 138, 137, 140, 114, 118, 129, 117 ?August 2022 - 100s FBGs. Some PPD >200s attributed to snacking ?June 2022 - 141, 179, 211, 143 ?Denies hypoglycemic/hyperglycemic symptoms ?-Current exercise: October - walking 2-3x per week 10-20 minutes at a time. Goal is to start walking more ?-Educated on A1c and blood sugar goals; ?Benefits of routine self-monitoring of blood sugar; ?-Counseled on diet and exercise extensively ?Recommended to continue current medication ?Reminder set for November 2022 to reach out to Dr Signe Colt to ensure new eRx sent in for following year on farxiga - pharmacy is [facility] ? ?Update 06/28/21 ?Patient has recently discontinued metformin due to GFR. ?She was started on Tradjenta approximately 1 month ago. ?Blood sugars are around 150-180 fasting and are anywhere from 180-320 after she eats a meal. ?We spent a considerable amount of time today on dietary discussion: limiting carbs to one source per meal and cutting back on excess sweets from candies. ?We also discussed trying to implement some form of physical activity routine (walking). ?We have agreed to have her work on diet x 1 week, will have CMA follow up to check on glucose readings. ?If still elevated we could consider replacing  Tradjenta w/ GLP-1 providing she is willing to do injection. ?Will follow up in one week to determine next steps. ? ?Update 09/27/21 ?FBG - 193, 159, 181, 185, 202 ?PM - 235, 251 ?Glucose has been running high lately. ?Richard Kateri Plummer increased her Ozempic to 1mg  weekly.  However, she is in the donut hole and went to pick it up from the pharmacy cost was > $600. ?She has since completed PAP and we sent it in, pending approval. ?She will increase to 1mg  once approved ?Counseled again on dietary mods. ?Recheck A1c at next visit with PCP. ? ?  ? ?  ? ?The patient verbalized understanding of instructions, educational materials, and care plan provided today and declined offer to receive copy of patient instructions, educational materials, and care plan.  ?Telephone follow up appointment with pharmacy team member scheduled for: 6 months ? ? , [facility]  ?Erroll Luna, PharmD ?Clinical Pharmacist  ?[facility] [place] ?[phone] ? ?

## 2021-10-01 ENCOUNTER — Other Ambulatory Visit: Payer: Self-pay | Admitting: Family Medicine

## 2021-10-01 DIAGNOSIS — F419 Anxiety disorder, unspecified: Secondary | ICD-10-CM

## 2021-10-19 ENCOUNTER — Telehealth: Payer: Self-pay | Admitting: Family Medicine

## 2021-10-19 NOTE — Telephone Encounter (Signed)
Error

## 2021-10-19 NOTE — Telephone Encounter (Signed)
Pt is aware of this.  °

## 2021-10-19 NOTE — Telephone Encounter (Signed)
I have placed Ozempic in the refrigerator on side one.   ?

## 2021-10-22 DIAGNOSIS — E1122 Type 2 diabetes mellitus with diabetic chronic kidney disease: Secondary | ICD-10-CM | POA: Diagnosis not present

## 2021-10-22 DIAGNOSIS — N1832 Chronic kidney disease, stage 3b: Secondary | ICD-10-CM

## 2021-10-22 DIAGNOSIS — I5032 Chronic diastolic (congestive) heart failure: Secondary | ICD-10-CM

## 2021-10-24 ENCOUNTER — Encounter: Payer: Self-pay | Admitting: Family Medicine

## 2021-10-24 ENCOUNTER — Ambulatory Visit (INDEPENDENT_AMBULATORY_CARE_PROVIDER_SITE_OTHER): Payer: Medicare Other | Admitting: Family Medicine

## 2021-10-24 VITALS — BP 112/64 | HR 63 | Temp 97.5°F | Resp 17 | Ht 59.0 in | Wt 174.4 lb

## 2021-10-24 DIAGNOSIS — I10 Essential (primary) hypertension: Secondary | ICD-10-CM | POA: Diagnosis not present

## 2021-10-24 DIAGNOSIS — F419 Anxiety disorder, unspecified: Secondary | ICD-10-CM

## 2021-10-24 DIAGNOSIS — N1832 Chronic kidney disease, stage 3b: Secondary | ICD-10-CM | POA: Diagnosis not present

## 2021-10-24 DIAGNOSIS — E785 Hyperlipidemia, unspecified: Secondary | ICD-10-CM | POA: Diagnosis not present

## 2021-10-24 DIAGNOSIS — E1122 Type 2 diabetes mellitus with diabetic chronic kidney disease: Secondary | ICD-10-CM | POA: Diagnosis not present

## 2021-10-24 DIAGNOSIS — F32A Depression, unspecified: Secondary | ICD-10-CM

## 2021-10-24 LAB — CBC WITH DIFFERENTIAL/PLATELET
Basophils Absolute: 0.1 10*3/uL (ref 0.0–0.1)
Basophils Relative: 1.1 % (ref 0.0–3.0)
Eosinophils Absolute: 0.1 10*3/uL (ref 0.0–0.7)
Eosinophils Relative: 1.8 % (ref 0.0–5.0)
HCT: 43.1 % (ref 36.0–46.0)
Hemoglobin: 14.2 g/dL (ref 12.0–15.0)
Lymphocytes Relative: 23.7 % (ref 12.0–46.0)
Lymphs Abs: 1.9 10*3/uL (ref 0.7–4.0)
MCHC: 32.9 g/dL (ref 30.0–36.0)
MCV: 94 fl (ref 78.0–100.0)
Monocytes Absolute: 0.9 10*3/uL (ref 0.1–1.0)
Monocytes Relative: 10.5 % (ref 3.0–12.0)
Neutro Abs: 5.2 10*3/uL (ref 1.4–7.7)
Neutrophils Relative %: 62.9 % (ref 43.0–77.0)
Platelets: 213 10*3/uL (ref 150.0–400.0)
RBC: 4.59 Mil/uL (ref 3.87–5.11)
RDW: 14.1 % (ref 11.5–15.5)
WBC: 8.2 10*3/uL (ref 4.0–10.5)

## 2021-10-24 LAB — HEMOGLOBIN A1C: Hgb A1c MFr Bld: 8.6 % — ABNORMAL HIGH (ref 4.6–6.5)

## 2021-10-24 LAB — HEPATIC FUNCTION PANEL
ALT: 40 U/L — ABNORMAL HIGH (ref 0–35)
AST: 20 U/L (ref 0–37)
Albumin: 4.4 g/dL (ref 3.5–5.2)
Alkaline Phosphatase: 56 U/L (ref 39–117)
Bilirubin, Direct: 0.1 mg/dL (ref 0.0–0.3)
Total Bilirubin: 0.4 mg/dL (ref 0.2–1.2)
Total Protein: 6.7 g/dL (ref 6.0–8.3)

## 2021-10-24 LAB — LIPID PANEL
Cholesterol: 143 mg/dL (ref 0–200)
HDL: 51.1 mg/dL (ref >39.00)
LDL Cholesterol: 57 mg/dL (ref 0–99)
NonHDL: 92.36
Total CHOL/HDL Ratio: 3
Triglycerides: 179 mg/dL — ABNORMAL HIGH (ref 0.0–149.0)
VLDL: 35.8 mg/dL (ref 0.0–40.0)

## 2021-10-24 LAB — BASIC METABOLIC PANEL
BUN: 32 mg/dL — ABNORMAL HIGH (ref 6–23)
CO2: 29 mEq/L (ref 19–32)
Calcium: 10.3 mg/dL (ref 8.4–10.5)
Chloride: 102 mEq/L (ref 96–112)
Creatinine, Ser: 1.65 mg/dL — ABNORMAL HIGH (ref 0.40–1.20)
GFR: 31.5 mL/min — ABNORMAL LOW (ref >60.00)
Glucose, Bld: 155 mg/dL — ABNORMAL HIGH (ref 70–99)
Potassium: 4 mEq/L (ref 3.5–5.1)
Sodium: 138 mEq/L (ref 135–145)

## 2021-10-24 LAB — TSH: TSH: 1.83 u[IU]/mL (ref 0.35–5.50)

## 2021-10-24 MED ORDER — GLIPIZIDE ER 10 MG PO TB24: 10.0000 mg | ORAL_TABLET | Freq: Every day | ORAL | 1 refills | Status: DC

## 2021-10-24 MED ORDER — BUPROPION HCL ER (XL) 300 MG PO TB24: 300.0000 mg | ORAL_TABLET | Freq: Every day | ORAL | 1 refills | Status: DC

## 2021-10-24 NOTE — Assessment & Plan Note (Signed)
Chronic problem.  Sugars have improved since increasing Ozempic to 1mg  weekly.  She is asking to change to extended release Glipizide- new prescription sent.  UTD on foot exam, microalbumin, eye exam.  Currently asymptomatic.  Check labs.  Adjust meds prn  ?

## 2021-10-24 NOTE — Patient Instructions (Signed)
Follow up in 3-4 months to recheck diabetes ?We'll notify you of your lab results and make any changes if needed ?SWITCH the Glipizide to the once daily long acting ?INCREASE the Wellbutrin to 300mg  daily- 2 of what you have at home and 1 of the new prescription ?Continue to work on low carb diet and regular walking- you can do it! ?Call with any questions or concerns ?Happy Spring!!! ?

## 2021-10-24 NOTE — Assessment & Plan Note (Signed)
Ongoing issue.  Pt's BMI of 35 is considered morbid obesity due to her HTN, hyperlipidemia, and DM.  Encouraged low carb diet and regular physical activity.  Will follow. ?

## 2021-10-24 NOTE — Assessment & Plan Note (Signed)
Chronic problem.  Well controlled today on Amlodipine, Coreg, Lasix.  Currently asymptomatic.  Check labs due to diuretic but no anticipated med changes. ?

## 2021-10-24 NOTE — Assessment & Plan Note (Signed)
Pt is interested in increasing Wellbutrin to 300mg  daily for additional symptom control.  New prescription sent ?

## 2021-10-24 NOTE — Progress Notes (Signed)
? ?  Subjective:  ? ? Patient ID: Kayla Brown, female    DOB: Dec 26, 1950, 71 y.o.   MRN: [id number] ? ?HPI ?HTN- chronic problem, on Amlodipine 10mg  daily, Coreg 25mg  BID, Lasix 20mg  daily w/ good control.  No CP, SOB, HAs, visual changes, edema. ? ?Hyperlipidemia- chronic problem, on Lipitor 40mg  daily.  No abd pain, N/V. ? ?DM- chronic problem, on Farxiga 10mg  daily, Glipizide 10mg  BID, Ozempic 1mg  weekly.  Last A1C 7.8% on 11/28.  UTD on foot exam.  UTD on microalbumin.  UTD on eye exam w/ Dr (will get records)  Pt reports sugars have decreased somewhat since increasing Ozempic.  Sugars are now primarily running 130-200.  Denies symptomatic lows.  No numbness/tingling of hands/feet. ? ?Anxiety/Depression- pt would like to increase the Wellbutrin if possible b/c she feels that while she is better there is still room to improve ? ? ?Review of Systems ?For ROS see HPI  ?   ?Objective:  ? Physical Exam ?Vitals reviewed.  ?Constitutional:   ?   General: She is not in acute distress. ?   Appearance: Normal appearance. She is well-developed. She is obese. She is not ill-appearing.  ?HENT:  ?   Head: Normocephalic and atraumatic.  ?Eyes:  ?   Conjunctiva/sclera: Conjunctivae normal.  ?Neck:  ?   Thyroid: No thyromegaly.  ?Cardiovascular:  ?   Rate and Rhythm: Normal rate and regular rhythm.  ?   Pulses: Normal pulses.  ?   Heart sounds: Normal heart sounds. No murmur heard. ?Pulmonary:  ?   Effort: Pulmonary effort is normal. No respiratory distress.  ?   Breath sounds: Normal breath sounds.  ?Abdominal:  ?   General: There is no distension.  ?   Palpations: Abdomen is soft.  ?   Tenderness: There is no abdominal tenderness.  ?Musculoskeletal:  ?   Cervical back: Normal range of motion and neck supple.  ?   Right lower leg: No edema.  ?   Left lower leg: No edema.  ?Lymphadenopathy:  ?   Cervical: No cervical adenopathy.  ?Skin: ?   General: Skin is warm and dry.  ?Neurological:  ?   Mental Status: She is alert  and oriented to person, place, and time. Mental status is at baseline.  ?Psychiatric:     ?   Mood and Affect: Mood normal.     ?   Behavior: Behavior normal.  ? ? ? ? ? ?   ?Assessment & Plan:  ? ? ?

## 2021-10-25 ENCOUNTER — Telehealth: Payer: Self-pay

## 2021-10-25 DIAGNOSIS — Z20822 Contact with and (suspected) exposure to covid-19: Secondary | ICD-10-CM | POA: Diagnosis not present

## 2021-10-25 NOTE — Progress Notes (Signed)
Left pt a Vm to call office in regards to her lab results  ?

## 2021-10-25 NOTE — Telephone Encounter (Signed)
Patient returned phone call about lab results.  

## 2021-11-08 ENCOUNTER — Ambulatory Visit (INDEPENDENT_AMBULATORY_CARE_PROVIDER_SITE_OTHER): Payer: Medicare Other

## 2021-11-08 DIAGNOSIS — E785 Hyperlipidemia, unspecified: Secondary | ICD-10-CM

## 2021-11-08 DIAGNOSIS — E1122 Type 2 diabetes mellitus with diabetic chronic kidney disease: Secondary | ICD-10-CM

## 2021-11-08 DIAGNOSIS — I5032 Chronic diastolic (congestive) heart failure: Secondary | ICD-10-CM

## 2021-11-08 DIAGNOSIS — I10 Essential (primary) hypertension: Secondary | ICD-10-CM

## 2021-11-08 NOTE — Patient Instructions (Signed)
Visit Information ? ?Thank you for taking time to visit with me today. Please don't hesitate to contact me if I can be of assistance to you before our next scheduled telephone appointment. ? ?Following are the goals we discussed today:  ?Take all medications as prescribed ?Attend all scheduled provider appointments ?Call pharmacy for medication refills 3-7 days in advance of running out of medications ?Call provider office for new concerns or questions  ?call office if I gain more than 2 pounds in one day or 5 pounds in one week ?keep legs up while sitting ?use salt in moderation ?watch for swelling in feet, ankles and legs every day ?weigh myself daily ?follow rescue plan if symptoms flare-up ?eat more whole grains, fruits and vegetables, lean meats and healthy fats ?keep appointment with eye doctor ?check blood sugar at prescribed times: twice daily and when you have symptoms of low or high blood sugar ?check feet daily for cuts, sores or redness ?drink 6 to 8 glasses of water each day ?fill half of plate with vegetables ?manage portion size ?switch to low-fat or skim milk ?switch to sugar-free drinks ?keep feet up while sitting ?wear comfortable, cotton socks ?check blood pressure daily ?choose a place to take my blood pressure (home, clinic or office, retail store) ?take blood pressure log to all doctor appointments ?take medications for blood pressure exactly as prescribed ?eat more whole grains, fruits and vegetables, lean meats and healthy fats ?limit salt intake to 2300mg /day ?call for medicine refill 2 or 3 days before it runs out ?take all medications exactly as prescribed ?call doctor with any symptoms you believe are related to your medicine ?Carbohydrate Counting for Diabetes Mellitus, Adult ?Carbohydrate counting is a method of keeping track of how many carbohydrates you eat. Eating carbohydrates increases the amount of sugar (glucose) in the blood. Counting how many carbohydrates you eat improves how  well you manage your blood glucose. This, in turn, helps you manage your diabetes. ?Carbohydrates are measured in grams (g) per serving. It is important to know how many carbohydrates (in grams or by serving size) you can have in each meal. This is different for every person. A dietitian can help you make a meal plan and calculate how many carbohydrates you should have at each meal and snack. ?What foods contain carbohydrates? ?Carbohydrates are found in the following foods: ?Grains, such as breads and cereals. ?Dried beans and soy products. ?Starchy vegetables, such as potatoes, peas, and corn. ?Fruit and fruit juices. ?Milk and yogurt. ?Sweets and snack foods, such as cake, cookies, candy, chips, and soft drinks. ?How do I count carbohydrates in foods? ?There are two ways to count carbohydrates in food. You can read food labels or learn standard serving sizes of foods. You can use either of these methods or a combination of both. ?Using the Nutrition Facts label ?The Nutrition Facts list is included on the labels of almost all packaged foods and beverages in the [place]. It includes: ?The serving size. ?Information about nutrients in each serving, including the grams of carbohydrate per serving. ?To use the Nutrition Facts, decide how many servings you will have. Then, multiply the number of servings by the number of carbohydrates per serving. The resulting number is the total grams of carbohydrates that you will be having. ?Learning the standard serving sizes of foods ?When you eat carbohydrate foods that are not packaged or do not include Nutrition Facts on the label, you need to measure the servings in order to count  the grams of carbohydrates. ?Measure the foods that you will eat with a food scale or measuring cup, if needed. ?Decide how many standard-size servings you will eat. ?Multiply the number of servings by 15. For foods that contain carbohydrates, one serving equals 15 g of carbohydrates. ?For  example, if you eat 2 cups or 10 oz (300 g) of strawberries, you will have eaten 2 servings and 30 g of carbohydrates (2 servings x 15 g = 30 g). ?For foods that have more than one food mixed, such as soups and casseroles, you must count the carbohydrates in each food that is included. ?The following list contains standard serving sizes of common carbohydrate-rich foods. Each of these servings has about 15 g of carbohydrates: ?1 slice of bread. ?1 six-inch (15 cm) tortilla. ?? cup or 2 oz (53 g) cooked rice or pasta. ?? cup or 3 oz (85 g) cooked or canned, drained and rinsed beans or lentils. ?? cup or 3 oz (85 g) starchy vegetable, such as peas, corn, or squash. ?? cup or 4 oz (120 g) hot cereal. ?? cup or 3 oz (85 g) boiled or mashed potatoes, or ? or 3 oz (85 g) of a large baked potato. ?? cup or 4 fl oz (118 mL) fruit juice. ?1 cup or 8 fl oz (237 mL) milk. ?1 small or 4 oz (106 g) apple. ?? or 2 oz (63 g) of a medium banana. ?1 cup or 5 oz (150 g) strawberries. ?3 cups or 1 oz (28.3 g) popped popcorn. ?What is an example of carbohydrate counting? ?To calculate the grams of carbohydrates in this sample meal, follow the steps shown below. ?Sample meal ?3 oz (85 g) chicken breast. ?? cup or 4 oz (106 g) brown rice. ?? cup or 3 oz (85 g) corn. ?1 cup or 8 fl oz (237 mL) milk. ?1 cup or 5 oz (150 g) strawberries with sugar-free whipped topping. ?Carbohydrate calculation ?Identify the foods that contain carbohydrates: ?Rice. ?Corn. ?Milk. ?Strawberries. ?Calculate how many servings you have of each food: ?2 servings rice. ?1 serving corn. ?1 serving milk. ?1 serving strawberries. ?Multiply each number of servings by 15 g: ?2 servings rice x 15 g = 30 g. ?1 serving corn x 15 g = 15 g. ?1 serving milk x 15 g = 15 g. ?1 serving strawberries x 15 g = 15 g. ?Add together all of the amounts to find the total grams of carbohydrates eaten: ?30 g + 15 g + 15 g + 15 g = 75 g of carbohydrates total. ?What are tips for following  this plan? ?Shopping ?Develop a meal plan and then make a shopping list. ?Buy fresh and frozen vegetables, fresh and frozen fruit, dairy, eggs, beans, lentils, and whole grains. ?Look at food labels. Choose foods that have more fiber and less sugar. ?Avoid processed foods and foods with added sugars. ?Meal planning ?Aim to have the same number of grams of carbohydrates at each meal and for each snack time. ?Plan to have regular, balanced meals and snacks. ?Where to find more information ?American Diabetes Association: diabetes.org ?Centers for Disease Control and Prevention: StoreMirror.com.cy ?Academy of Nutrition and Dietetics: eatright.org ?Association of Diabetes Care & Education Specialists: diabeteseducator.org ?Summary ?Carbohydrate counting is a method of keeping track of how many carbohydrates you eat. ?Eating carbohydrates increases the amount of sugar (glucose) in your blood. ?Counting how many carbohydrates you eat improves how well you manage your blood glucose. This helps you manage your diabetes. ?  A dietitian can help you make a meal plan and calculate how many carbohydrates you should have at each meal and snack. ?This information is not intended to replace advice given to you by your health care provider. Make sure you discuss any questions you have with your health care provider. ?Document Revised: 01/13/2020 Document Reviewed: 01/13/2020 ?Elsevier Patient Education ? [place]. ? ?Our next appointment is by telephone on 12/27/21 at 3 PM ? ?Please call the care guide team at [phone] if you need to cancel or reschedule your appointment.  ? ?If you are experiencing a Mental Health or [place] or need someone to talk to, please call the Suicide and Crisis Lifeline: 988 ?call the [place] National Suicide Prevention Lifeline: 909 312 3167 or TTY: (662)845-0813 TTY 713-681-8626) to talk to a trained counselor ?call 1-800-273-TALK (toll free, 24 hour hotline) ?go to [facility] [address], [place] [phone]) ?call 911  ? ?Patient verbalizes understanding of instructions and care plan provided today and agrees to view in [place]. Active [employer]

## 2021-11-08 NOTE — Chronic Care Management (AMB) (Signed)
Chronic Care Management   CCM RN Visit Note  11/08/2021 Name: Kayla Brown MRN: [id number] DOB: 1950/11/01  Subjective: Kayla Brown is a 71 y.o. year old female who is a primary care patient of Birdie Riddle, [name], MD. The care management team was consulted for assistance with disease management and care coordination needs.    Engaged with patient by telephone for follow up visit in response to provider referral for case management and/or care coordination services.   Consent to Services:  The patient was given information about Chronic Care Management services, agreed to services, and gave verbal consent prior to initiation of services.  Please see initial visit note for detailed documentation.   Patient agreed to services and verbal consent obtained.   Assessment: Review of patient past medical history, allergies, medications, health status, including review of consultants reports, laboratory and other test data, was performed as part of comprehensive evaluation and provision of chronic care management services.   SDOH (Social Determinants of Health) assessments and interventions performed:    CCM Care Plan  No Known Allergies  Outpatient Encounter Medications as of 11/08/2021  Medication Sig Note   albuterol (VENTOLIN HFA) 108 (90 Base) MCG/ACT inhaler Inhale 1-2 puffs into the lungs every 6 (six) hours as needed for wheezing or shortness of breath.    Alcohol Swabs (ALCOHOL PREP) PADS Pt uses an alcohol pad each time sugars are tested. Pt tests twice daily. Dx E11.9    amLODipine (NORVASC) 10 MG tablet TAKE 1 TABLET BY MOUTH EVERY DAY    atorvastatin (LIPITOR) 40 MG tablet TAKE 1 TABLET BY MOUTH EVERY DAY    B Complex Vitamins (B COMPLEX PO) Take 1 tablet by mouth daily.    buPROPion (WELLBUTRIN XL) 300 MG 24 hr tablet Take 1 tablet (300 mg total) by mouth daily.    Calcium Carb-Cholecalciferol [phone] MG-UNIT CAPS Take 1 tablet by mouth 2 (two) times daily.    carvedilol  (COREG) 25 MG tablet TAKE 1 TABLET BY MOUTH TWICE A DAY    dapagliflozin propanediol (FARXIGA) 10 MG TABS tablet Take 10 mg by mouth daily. 01/25/2021: Gets through patient assistance.    fish oil-omega-3 fatty acids 1000 MG capsule Take 1 g by mouth daily.    fluticasone (FLONASE) 50 MCG/ACT nasal spray SPRAY 2 SPRAYS INTO EACH NOSTRIL EVERY DAY (Patient taking differently: Place 2 sprays into both nostrils daily.)    furosemide (LASIX) 20 MG tablet TAKE 1 TABLET BY MOUTH EVERY DAY    glipiZIDE (GLUCOTROL XL) 10 MG 24 hr tablet Take 1 tablet (10 mg total) by mouth daily with breakfast.    glucose blood (FREESTYLE LITE) test strip USE TO TEST BLOOD GLUCOSE 2 TIMES DAILY. Dx E11.9    loratadine (CLARITIN) 10 MG tablet TAKE 1 TABLET BY MOUTH EVERY DAY (Patient taking differently: Take 10 mg by mouth daily.)    Melatonin 3 MG TABS Take 10 mg by mouth at bedtime.     methocarbamol (ROBAXIN) 500 MG tablet Take 500 mg by mouth as needed.    Multiple Vitamins-Minerals (VISION FORMULA) TABS Take 1 tablet by mouth daily.    OVER THE COUNTER MEDICATION Take 1 tablet by mouth daily. Equate Vision Formula 50+    Semaglutide, 1 MG/DOSE, 4 MG/3ML SOPN Inject 1 mg as directed once a week.    sertraline (ZOLOFT) 50 MG tablet TAKE 1 TABLET BY MOUTH EVERY DAY    traMADol (ULTRAM) 50 MG tablet Take 1 tablet (50 mg total) by  mouth every 6 (six) hours as needed.    TRUEPLUS LANCETS 30G MISC Use one lancet each time sugars are tested. Pt tests twice daily. Dx. E11.9    UNABLE TO FIND Med Name: [employer] talking meter    No facility-administered encounter medications on file as of 11/08/2021.    Patient Active Problem List   Diagnosis Date Noted   Reactive airway disease 01/30/2021   Morbid obesity ([place]) 11/18/2020   Osteopenia 11/18/2020   Type 2 diabetes mellitus with stage 3a chronic kidney disease, without long-term current use of insulin ([place]) 07/21/2020   Type 2 diabetes mellitus with hyperglycemia, with  long-term current use of insulin ([place]) 04/19/2020   Lumbar radiculopathy 03/02/2020   Left hip pain 12/01/2019   Type 2 diabetes mellitus with stage 3b chronic kidney disease, without long-term current use of insulin ([place]) 36/64/4034   Diastolic CHF ([place]) 74/25/9563   Dyspnea 03/08/2019   Pulmonary edema 03/04/2019   Physical exam 02/06/2017   Anxiety and depression 12/05/2016   Obesity (BMI 30-39.9) 03/01/2016   OSA (obstructive sleep apnea) 07/11/2015   Pulmonary hypertension ([place]) 05/06/2015   Heart murmur 03/03/2015   Essential hypertension, benign 03/18/2014   Hyperlipidemia 03/18/2014   Post-menopause on HRT (hormone replacement therapy) 03/18/2014    Conditions to be addressed/monitored:CHF, HTN, HLD, DMII, and CKD Stage 3  Care Plan : RN Care Manager Plan of Care  Updates made by [name], RN since 11/08/2021 12:00 AM     Problem: Chronic Disease Management and Care Coordination Needs (DM, CHF, CKD,HTN and HLD)   Priority: High     Long-Range Goal: Establish Plan of Care for Chronic Disease Management Needs (DM, CHF, CKD,HTN and HLD)   Start Date: 06/14/2021  Expected End Date: 06/11/2022  Priority: High  Note:   Current Barriers:  Knowledge Deficits related to plan of care for management of CHF, HTN, HLD, DMII, and CKD Stage 3  Care Coordination needs related to Financial constraints related to cost of medications Chronic Disease Management support and education needs related to CHF, HTN, HLD, DMII, and CKD Stage 3  States that she is now taking  1 mg  dose of Ozempic. States she is not having any side effects with the increased dose. States she is trying to eat less and watch her portions of CHO better.  States her CBGs have been ranging 138-155 in the morning and  and before supper 180-190's . States she is trying to drink more water. States she does not drink anything with sugar and her diet drinks are clear.  States her weight is down to 164 and she has not  needed to take any extra Lasix. Denies any swelling, chest pains or shortness of breath.  States her B/P has been stable.  States her back pain has been better since her back injection. States she is walking 3-4 times a week for 10-15 minutes and she plans to work up to 5 times a week and to try to walk longer.  RNCM Clinical Goal(s):  Patient will verbalize understanding of plan for management of CHF, HTN, HLD, DMII, and CKD Stage 3 as evidenced by voiced adherence to plan of care verbalize basic understanding of  CHF, HTN, HLD, DMII, and CKD Stage 3 disease process and self health management plan as evidenced by voiced understanding and teach back take all medications exactly as prescribed and will call provider for medication related questions as evidenced by dispense report and pt verbalization attend all scheduled  medical appointments: Nephrology June, cardiology 12/20/21, Pulmonary 07/05/22, Dr. Birdie Riddle 01/25/22 as evidenced by medical records demonstrate Improved adherence to prescribed treatment plan for CHF, HTN, HLD, DMII, and CKD Stage 3 as evidenced by readings within limits, voiced adherence to plan of care continue to work with RN Care Manager to address care management and care coordination needs related to  CHF, HTN, HLD, DMII, and CKD Stage 3 as evidenced by adherence to CM Team Scheduled appointments work with pharmacist to address Financial constraints related to cost of medications related toCHF, HTN, HLD, DMII, and CKD Stage 3 as evidenced by review or EMR and patient or pharmacist report through collaboration with RN Care manager, provider, and care team.   Interventions: 1:1 collaboration with primary care provider regarding development and update of comprehensive plan of care as evidenced by provider attestation and co-signature Inter-disciplinary care team collaboration (see longitudinal plan of care) Evaluation of current treatment plan related to  self management and patient's  adherence to plan as established by provider   Heart Failure Interventions:  (Status:  Goal on track:  Yes.) Long Term Goal Provided education on low sodium diet Reviewed Heart Failure Action Plan in depth and provided written copy Discussed importance of daily weight and advised patient to weigh and record daily Reviewed role of diuretics in prevention of fluid overload and management of heart failure; Discussed the importance of keeping all appointments with provider Advised patient to discuss need for fluid restriction with provider Reinforced to take Lasix as needed for weight gain   Chronic Kidney Disease Interventions:  (Status:  Goal on track:  Yes.) Long Term Goal Evaluation of current treatment plan related to chronic kidney disease self management and patient's adherence to plan as established by provider      Reviewed prescribed diet low sodium low CHO Reviewed medications with patient and discussed importance of compliance    Counseled on the importance of exercise goals with target of 150 minutes per week     Discussed complications of poorly controlled blood pressure such as heart disease, stroke, circulatory complications, vision complications, kidney impairment, sexual dysfunction    Discussed plans with patient for ongoing care management follow up and provided patient with direct contact information for care management team    Provided education on kidney disease progression    Reinforced importance of keep blood sugars under control to help with kidney function Last practice recorded BP readings:  BP Readings from Last 3 Encounters:  10/24/21 112/64  09/18/21 136/70  07/04/21 130/60  Most recent eGFR/CrCl: No results found for: EGFR  No components found for: CRCL    Diabetes Interventions:  (Status:  Goal on track:  NO.) Long Term Goal Assessed patient's understanding of A1c goal: <7% Provided education to patient about basic DM disease process Reviewed medications  with patient and discussed importance of medication adherence Counseled on importance of regular laboratory monitoring as prescribed Discussed plans with patient for ongoing care management follow up and provided patient with direct contact information for care management team Advised patient, providing education and rationale, to check cbg twice daily and record, calling provider for findings outside established parameters Review of patient status, including review of consultants reports, relevant laboratory and other test results, and medications completed Reviewed to continue  walking 3-4 times a week and to increase time and number of days as tolerated. Reinforced how exercise can help lower blood sugars. Reinforced  to eat healthy protein snacks and to limit her CHO intake.  Reinforced fasting blood sugar goals of 80-130 and less than 180 1 1/2-2 hours after meals.  Reviewed portion sizes of fruit Lab Results  Component Value Date   HGBA1C 8.6 (H) 10/24/2021   Hyperlipidemia Interventions:  (Status:  Condition stable.  Not addressed this visit.) Long Term Goal Medication review performed; medication list updated in electronic medical record.  Counseled on importance of regular laboratory monitoring as prescribed Reviewed role and benefits of statin for ASCVD risk reduction Reviewed importance of limiting foods high in cholesterol Reviewed exercise goals and target of 150 minutes per week  Hypertension Interventions:  (Status:  Goal on track:  Yes.) Long Term Goal Last practice recorded BP readings:  BP Readings from Last 3 Encounters:  10/24/21 112/64  09/18/21 136/70  07/04/21 130/60  Most recent eGFR/CrCl: No results found for: EGFR  No components found for: CRCL  Evaluation of current treatment plan related to hypertension self management and patient's adherence to plan as established by provider Provided education to patient re: stroke prevention, s/s of heart attack and  stroke Reviewed medications with patient and discussed importance of compliance Discussed plans with patient for ongoing care management follow up and provided patient with direct contact information for care management team Advised patient, providing education and rationale, to monitor blood pressure daily and record, calling PCP for findings outside established parameters Discussed complications of poorly controlled blood pressure such as heart disease, stroke, circulatory complications, vision complications, kidney impairment, sexual dysfunction Reviewed to follow a low sodium low CHO diet   Pain Interventions:  (Status:  Goal on track:  Yes.) Long Term Goal Pain assessment performed Medications reviewed Reviewed provider established plan for pain management Discussed importance of adherence to all scheduled medical appointments Counseled on the importance of reporting any/all new or changed pain symptoms or management strategies to pain management provider Advised patient to report to care team affect of pain on daily activities Discussed use of relaxation techniques and/or diversional activities to assist with pain reduction (distraction, imagery, relaxation, massage, acupressure, TENS, heat, and cold application Reviewed with patient prescribed pharmacological and nonpharmacological pain relief strategies   Patient Goals/Self-Care Activities: Take all medications as prescribed Attend all scheduled provider appointments Call pharmacy for medication refills 3-7 days in advance of running out of medications Call provider office for new concerns or questions  call office if I gain more than 2 pounds in one day or 5 pounds in one week keep legs up while sitting use salt in moderation watch for swelling in feet, ankles and legs every day weigh myself daily follow rescue plan if symptoms flare-up eat more whole grains, fruits and vegetables, lean meats and healthy fats keep appointment  with eye doctor check blood sugar at prescribed times: twice daily and when you have symptoms of low or high blood sugar check feet daily for cuts, sores or redness drink 6 to 8 glasses of water each day fill half of plate with vegetables manage portion size switch to low-fat or skim milk switch to sugar-free drinks keep feet up while sitting wear comfortable, cotton socks check blood pressure daily choose a place to take my blood pressure (home, clinic or office, retail store) take blood pressure log to all doctor appointments take medications for blood pressure exactly as prescribed eat more whole grains, fruits and vegetables, lean meats and healthy fats limit salt intake to 2384m/day call for medicine refill 2 or 3 days before it runs out take all medications exactly as prescribed call doctor  with any symptoms you believe are related to your medicine  Follow Up Plan:  Telephone follow up appointment with care management team member scheduled for:  12/27/21 The patient has been provided with contact information for the care management team and has been advised to call with any health related questions or concerns.       Plan:Telephone follow up appointment with care management team member scheduled for:  12/27/21 The patient has been provided with contact information for the care management team and has been advised to call with any health related questions or concerns.  Peter Garter RN, Jackquline Denmark, CDE Care Management Coordinator [facility] [phone]

## 2021-11-15 ENCOUNTER — Ambulatory Visit: Payer: Medicare Other | Admitting: Podiatry

## 2021-11-22 DIAGNOSIS — Z6833 Body mass index (BMI) 33.0-33.9, adult: Secondary | ICD-10-CM | POA: Diagnosis not present

## 2021-11-22 DIAGNOSIS — Z7984 Long term (current) use of oral hypoglycemic drugs: Secondary | ICD-10-CM | POA: Diagnosis not present

## 2021-11-22 DIAGNOSIS — E1122 Type 2 diabetes mellitus with diabetic chronic kidney disease: Secondary | ICD-10-CM

## 2021-11-22 DIAGNOSIS — E785 Hyperlipidemia, unspecified: Secondary | ICD-10-CM

## 2021-11-22 DIAGNOSIS — I130 Hypertensive heart and chronic kidney disease with heart failure and stage 1 through stage 4 chronic kidney disease, or unspecified chronic kidney disease: Secondary | ICD-10-CM

## 2021-11-22 DIAGNOSIS — N1832 Chronic kidney disease, stage 3b: Secondary | ICD-10-CM

## 2021-11-22 DIAGNOSIS — I509 Heart failure, unspecified: Secondary | ICD-10-CM

## 2021-11-22 DIAGNOSIS — M4316 Spondylolisthesis, lumbar region: Secondary | ICD-10-CM | POA: Diagnosis not present

## 2021-11-22 DIAGNOSIS — M5416 Radiculopathy, lumbar region: Secondary | ICD-10-CM | POA: Diagnosis not present

## 2021-12-05 DIAGNOSIS — M5416 Radiculopathy, lumbar region: Secondary | ICD-10-CM | POA: Diagnosis not present

## 2021-12-06 ENCOUNTER — Ambulatory Visit (INDEPENDENT_AMBULATORY_CARE_PROVIDER_SITE_OTHER): Payer: Medicare Other | Admitting: Podiatry

## 2021-12-06 ENCOUNTER — Encounter: Payer: Self-pay | Admitting: Podiatry

## 2021-12-06 DIAGNOSIS — E1122 Type 2 diabetes mellitus with diabetic chronic kidney disease: Secondary | ICD-10-CM | POA: Diagnosis not present

## 2021-12-06 DIAGNOSIS — N1832 Chronic kidney disease, stage 3b: Secondary | ICD-10-CM

## 2021-12-06 DIAGNOSIS — M79674 Pain in right toe(s): Secondary | ICD-10-CM | POA: Diagnosis not present

## 2021-12-06 DIAGNOSIS — B351 Tinea unguium: Secondary | ICD-10-CM

## 2021-12-06 DIAGNOSIS — M79675 Pain in left toe(s): Secondary | ICD-10-CM

## 2021-12-06 NOTE — Progress Notes (Signed)
This patient returns to my office for at risk foot care.  This patient requires this care by a professional since this patient will be at risk due to having diabetes mellitus.  This patient is unable to cut nails herself since the patient cannot reach her nails.These nails are painful walking and wearing shoes.  This patient presents for at risk foot care today.  General Appearance  Alert, conversant and in no acute stress.  Vascular  Dorsalis pedis and posterior tibial  pulses are palpable  bilaterally.  Capillary return is within normal limits  bilaterally. Temperature is within normal limits  bilaterally.  Neurologic  Senn-Weinstein monofilament wire test within normal limits  bilaterally. Muscle power within normal limits bilaterally.  Nails Thick disfigured discolored nails with subungual debris  from hallux to fifth toes bilaterally. No evidence of bacterial infection or drainage bilaterally.  Orthopedic  No limitations of motion  feet .  No crepitus or effusions noted.  No bony pathology or digital deformities noted.  HAV  B/L.  Hammer toes  B/L.  Skin  normotropic skin with no porokeratosis noted bilaterally.  No signs of infections or ulcers noted.     Onychomycosis  Pain in right toes  Pain in left toes  Consent was obtained for treatment procedures.   Mechanical debridement of nails 1-5  bilaterally performed with a nail nipper.  Filed with dremel without incident.    Return office visit   4 mos.                  Told patient to return for periodic foot care and evaluation due to potential at risk complications.   Gardiner Barefoot DPM

## 2021-12-17 NOTE — Progress Notes (Deleted)
Cardiology Office Note   Date:  12/17/2021   ID:  [facility], DOB 04-28-1951, MRN [id number]  PCP:  [name], MD  Cardiologist:   Milianna Ericsson [name], MD   No chief complaint on file.      History of Present Illness: [facility] is a 71 y.o. female who is seen for follow up dyspnea. She has a history of OSA.  She has a history of mild pulmonary HTN based on Echo in 2016 and 5681 but not replicated on Echo in 2751 and 2020. Myoview in August 2020 was normal. She has  HTN and dyslipidemia. Prior pulmonary work up includes PFT 04/04/15   - mild Diffusion defect, normal flows and volumes without response to bronchodilator. CT chest March 05, 2019 extensive atelectatic changes throughout the lungs with a dependent predominance, mosaic attenuation throughout the lungs, emphysema. VQ scan 03/04/2019 no unmatched segmental perfusion defects to suggest acute PE. Autoimmune work-up negative 2020. She had a 6-minute walk test that showed 267 m completed.  With no desaturations. Repeat PFTs show no airflow obstruction or restriction.  FEV1 102%, ratio 86, FVC 90% normal mid flows,, DLCO 90%.  She was admitted 9/8-9/21/20 with acute hypoxic respiratory failure. Patient developed  increased oxygen requirement on 9/10, at 8 L nasal cannula, PCCM consulted, work-up significant for atelectasis and ? pulmonary edema. BNP level was normal.   Patient improved with IV diuresis, and pulmonary PT, but unfortunately developed acute kidney injury due to diuresis. ACEi, hygroton, spironolactone  were discontinued. CT, Echo,  and CXR findings as noted below. She diuresed 20 lbs in the hospital.    On follow up today she is doing well.  [facility]ives at [facility]. She did have another spinal injection earlier this month. Weight has been stable. Only took an extra lasix once in May when she over did it with her husband's birthday. Hasn't had to use inhaler. Is compliant with CPAP. Did see Dr Hollie Salk and renal indices  stable.    Past Medical History:  Diagnosis Date   Anxiety    Diabetes mellitus    Edema of extremities 03/02/2016   Essential hypertension, benign    Lazy eye    Major depressive disorder, single episode, unspecified    Obesity (BMI 30-39.9) 03/01/2016   OSA (obstructive sleep apnea)    Pulmonary HTN (HCC)    mild with PASP 78mHg by echo 05/2016   Pure hypercholesterolemia    Retinopathy, due to hypertension     Past Surgical History:  Procedure Laterality Date   CPAP TITRATION  10/14/2015   DILATION AND CURETTAGE OF UTERUS  2005-2008   x2   EYE SURGERY     cataracts   WISDOM TOOTH EXTRACTION       Current Outpatient Medications  Medication Sig Dispense Refill   albuterol (VENTOLIN HFA) 108 (90 Base) MCG/ACT inhaler Inhale 1-2 puffs into the lungs every 6 (six) hours as needed for wheezing or shortness of breath.     Alcohol Swabs (ALCOHOL PREP) PADS Pt uses an alcohol pad each time sugars are tested. Pt tests twice daily. Dx E11.9 100 each 3   amLODipine (NORVASC) 10 MG tablet TAKE 1 TABLET BY MOUTH EVERY DAY 90 tablet 1   atorvastatin (LIPITOR) 40 MG tablet TAKE 1 TABLET BY MOUTH EVERY DAY 90 tablet 1   B Complex Vitamins (B COMPLEX PO) Take 1 tablet by mouth daily.     buPROPion (WELLBUTRIN XL) 300 MG 24 hr tablet Take  1 tablet (300 mg total) by mouth daily. 90 tablet 1   Calcium Carb-Cholecalciferol [phone] MG-UNIT CAPS Take 1 tablet by mouth 2 (two) times daily.     carvedilol (COREG) 25 MG tablet TAKE 1 TABLET BY MOUTH TWICE A DAY 180 tablet 3   dapagliflozin propanediol (FARXIGA) 10 MG TABS tablet Take 10 mg by mouth daily.     fish oil-omega-3 fatty acids 1000 MG capsule Take 1 g by mouth daily.     fluticasone (FLONASE) 50 MCG/ACT nasal spray SPRAY 2 SPRAYS INTO EACH NOSTRIL EVERY DAY (Patient taking differently: Place 2 sprays into both nostrils daily.) 18 g 1   furosemide (LASIX) 20 MG tablet TAKE 1 TABLET BY MOUTH EVERY DAY 90 tablet 1   glipiZIDE (GLUCOTROL XL)  10 MG 24 hr tablet Take 1 tablet (10 mg total) by mouth daily with breakfast. 90 tablet 1   glucose blood (FREESTYLE LITE) test strip USE TO TEST BLOOD GLUCOSE 2 TIMES DAILY. Dx E11.9 100 each 12   loratadine (CLARITIN) 10 MG tablet TAKE 1 TABLET BY MOUTH EVERY DAY (Patient taking differently: Take 10 mg by mouth daily.) 30 tablet 11   Melatonin 3 MG TABS Take 10 mg by mouth at bedtime.      methocarbamol (ROBAXIN) 500 MG tablet Take 500 mg by mouth as needed.     methocarbamol (ROBAXIN) 500 MG tablet Take by mouth.     Multiple Vitamins-Minerals (VISION FORMULA) TABS Take 1 tablet by mouth daily.     OVER THE COUNTER MEDICATION Take 1 tablet by mouth daily. Equate Vision Formula 50+     Semaglutide, 1 MG/DOSE, 4 MG/3ML SOPN Inject 1 mg as directed once a week. 9 mL 0   sertraline (ZOLOFT) 50 MG tablet TAKE 1 TABLET BY MOUTH EVERY DAY 90 tablet 1   traMADol (ULTRAM) 50 MG tablet Take 1 tablet (50 mg total) by mouth every 6 (six) hours as needed. 30 tablet 0   traMADol (ULTRAM) 50 MG tablet Take 1 tablet by mouth every 6 (six) hours as needed.     TRUEPLUS LANCETS 30G MISC Use one lancet each time sugars are tested. Pt tests twice daily. Dx. E11.9 100 each 3   UNABLE TO FIND Med Name: Healthwise talking meter 1 kit 0   No current facility-administered medications for this visit.    Allergies:   Patient has no known allergies.    Social History:  The patient  reports that she has never smoked. She has never used smokeless tobacco. She reports that she does not drink alcohol and does not use drugs.   Family History:  The patient's family history includes ADD / ADHD in her mother; Alcohol abuse in her mother; Dementia in her mother. She was adopted.    ROS:  Please see the history of present illness.   Otherwise, review of systems are positive for none.   All other systems are reviewed and negative.    PHYSICAL EXAM: VS:  LMP 06/25/2008  , BMI There is no height or weight on file to  calculate BMI. GEN: Well nourished, obese, in no acute distress  HEENT: normal  Neck: no JVD, carotid bruits, or masses Cardiac: RRR; no murmurs, rubs, or gallops,no edema  Respiratory:  clear to auscultation bilaterally, normal work of breathing GI: soft, nontender, nondistended, + BS MS: no deformity or atrophy  Skin: warm and dry, no rash Neuro:  Strength and sensation are intact Psych: euthymic mood, full affect   EKG:  EKG isordered today. The ekg ordered today demonstrates NSR rate 53, old septal infarct. No change from May 2021. I have personally reviewed and interpreted this study.    Recent Labs: 10/24/2021: ALT 40; BUN 32; Creatinine, Ser 1.65; Hemoglobin 14.2; Platelets 213.0; Potassium 4.0; Sodium 138; TSH 1.83    Lipid Panel    Component Value Date/Time   CHOL 143 10/24/2021 0901   TRIG 179.0 (H) 10/24/2021 0901   HDL 51.10 10/24/2021 0901   CHOLHDL 3 10/24/2021 0901   VLDL 35.8 10/24/2021 0901   LDLCALC 57 10/24/2021 0901   LDLCALC 52 11/18/2020 1436   LDLDIRECT 50.0 05/23/2020 1431      Wt Readings from Last 3 Encounters:  10/24/21 174 lb 6.4 oz (79.1 kg)  09/18/21 174 lb 6.4 oz (79.1 kg)  07/04/21 181 lb 6.4 oz (82.3 kg)      Other studies Reviewed: Additional studies/ records that were reviewed today include:  Myoview 02/02/19: Study Highlights    Nuclear stress EF: 54%. There was no ST segment deviation noted during stress. This is a low risk study. The left ventricular ejection fraction is normal (55-65%). The study is normal.   Low risk stress nuclear study with normal perfusion and normal left ventricular regional and global systolic function.       Echo 03/06/19: IMPRESSIONS      1. The left ventricle has hyperdynamic systolic function, with an ejection fraction of >65%. The cavity size was normal. There is moderate concentric left ventricular hypertrophy. Left ventricular diastolic Doppler parameters are consistent with  impaired  relaxation. Elevated mean left atrial pressure No evidence of left ventricular regional wall motion abnormalities.  2. The right ventricle has normal systolic function. The cavity was normal. There is no increase in right ventricular wall thickness. Right ventricular systolic pressure could not be assessed.  3. Left atrial size was mildly dilated.  4. Trivial pericardial effusion is present.  5. The aorta is normal unless otherwise noted.   PORTABLE CHEST 1 VIEW   COMPARISON:  Chest radiograph dated 03/05/2019   FINDINGS: Chronic interstitial coarsening and mild bronchitic changes. No focal consolidation, pleural effusion, or pneumothorax. Linear density in the left mid lung field, likely atelectatic changes. The cardiac silhouette is within normal limits. No acute osseous pathology.   IMPRESSION: No active disease.     Electronically Signed   By: Anner Crete M.D.   On: 03/11/2019 10:21  CT CHEST WITHOUT CONTRAST   TECHNIQUE: Multidetector CT imaging of the chest was performed following the standard protocol without IV contrast.   COMPARISON:  CT 04/05/2015   FINDINGS: Cardiovascular: Normal heart size. No pericardial effusion. Atherosclerotic calcification of the aortic leaflets is noted. There is mitral annular calcification is well. The aorta is normal caliber with atheromatous plaque. Additional calcifications are present in the proximal great vessels and upper abdominal aorta. Major venous structures are unremarkable.   Mediastinum/Nodes: Numerous though nonenlarged mediastinal nodes are present. Evaluation for hilar adenopathy is limited in the absence of contrast. No axillary adenopathy. Thyroid gland and thoracic inlet are unremarkable. Bowing of the posterior trachea 0 likely related to imaging during exhalation. There is mild airways thickening. The esophagus is unremarkable.   Lungs/Pleura: Redemonstration of mosaic attenuation throughout the lungs  superimposed on centrilobular and paraseptal emphysema. There is increasing bandlike areas of subsegmental atelectasis and/or scarring as well as additional atelectatic change posteriorly in the lung bases. No pneumothorax. No effusion. Stable subpleural area of scarring is seen in the right  middle lobe.   Upper Abdomen: Small accessory splenule. Mild bilateral symmetric perinephric stranding, a nonspecific finding though may correlate with either age or decreased renal function. Portion of the hepatic flexure is interposed anterior to the liver. Fatty replacement of the pancreas. No pancreatic ductal dilatation or surrounding inflammatory changes. No acute abnormalities present in the visualized portions of the upper abdomen.   Musculoskeletal: Multilevel degenerative changes are present in the imaged portions of the spine. No acute osseous abnormality or suspicious osseous lesion.   IMPRESSION: 1. Extensive atelectatic changes are present throughout the lungs with a dependent predominance. 2. Redemonstration of mosaic attenuation throughout the lungs superimposed on centrilobular and paraseptal emphysema, suggestive of small airways disease. 3. Mild bilateral symmetric perinephric stranding, a nonspecific finding though may correlate with either age or decreased renal function. 4. Aortic Atherosclerosis (ICD10-I70.0) and Emphysema (ICD10-J43.9).     Electronically Signed   By: Lovena Le M.D.   On: 03/05/2019 22:41  ASSESSMENT AND PLAN:  1.  Chronic diastolic CHF. Admitted in September 2020 with acute respiratory failure. Given clinical response this appears to be predominantly acute on chronic diastolic CHF.  BNP low but may be masked by obesity. Echo with moderate LVH but normal systolic function and no evidence of pulmonary HTN. She is clinically doing well.  Continue sodium restriction. Continue current lasix dose with extra as needed. Continue sodium and fluid  restriction.   2.  Hypertension -BP is well controlled now. Continue Coreg and amlodipine.   3.  Morbid Obesity  -I have encouraged weight loss.   4.  OSA on CPAP. followed by pulmonary now.   5. CKD stage 3.   Current medicines are reviewed at length with the patient today.  The patient does not have concerns regarding medicines.  The following changes have been made:  See above.  Labs/ tests ordered today include:  No orders of the defined types were placed in this encounter.     Disposition:   FU with me in 6  months  Signed, Kiano Terrien [name], MD  12/17/2021 12:32 PM    [facility] [address], [place], [place], [address] Phone [phone], Fax [phone]

## 2021-12-20 ENCOUNTER — Ambulatory Visit: Payer: Medicare Other | Admitting: Cardiology

## 2021-12-20 ENCOUNTER — Telehealth: Payer: Self-pay

## 2021-12-20 ENCOUNTER — Other Ambulatory Visit: Payer: Self-pay

## 2021-12-20 ENCOUNTER — Telehealth: Payer: Self-pay | Admitting: Family Medicine

## 2021-12-20 ENCOUNTER — Encounter (HOSPITAL_BASED_OUTPATIENT_CLINIC_OR_DEPARTMENT_OTHER): Payer: Self-pay | Admitting: Emergency Medicine

## 2021-12-20 ENCOUNTER — Emergency Department (HOSPITAL_BASED_OUTPATIENT_CLINIC_OR_DEPARTMENT_OTHER)
Admission: EM | Admit: 2021-12-20 | Discharge: 2021-12-20 | Disposition: A | Discharge status: Home / Self Care | Payer: Medicare Other | Attending: Emergency Medicine | Admitting: Emergency Medicine

## 2021-12-20 DIAGNOSIS — Z7984 Long term (current) use of oral hypoglycemic drugs: Secondary | ICD-10-CM | POA: Insufficient documentation

## 2021-12-20 DIAGNOSIS — I509 Heart failure, unspecified: Secondary | ICD-10-CM | POA: Diagnosis not present

## 2021-12-20 DIAGNOSIS — I110 Hypertensive heart disease with heart failure: Secondary | ICD-10-CM | POA: Diagnosis not present

## 2021-12-20 DIAGNOSIS — Z79899 Other long term (current) drug therapy: Secondary | ICD-10-CM | POA: Diagnosis not present

## 2021-12-20 DIAGNOSIS — E1165 Type 2 diabetes mellitus with hyperglycemia: Secondary | ICD-10-CM | POA: Insufficient documentation

## 2021-12-20 DIAGNOSIS — R739 Hyperglycemia, unspecified: Secondary | ICD-10-CM | POA: Diagnosis present

## 2021-12-20 DIAGNOSIS — Z7951 Long term (current) use of inhaled steroids: Secondary | ICD-10-CM | POA: Diagnosis not present

## 2021-12-20 HISTORY — DX: Heart failure, unspecified: I50.9

## 2021-12-20 LAB — COMPREHENSIVE METABOLIC PANEL
ALT: 82 U/L — ABNORMAL HIGH (ref 0–44)
AST: 23 U/L (ref 15–41)
Albumin: 4.2 g/dL (ref 3.5–5.0)
Alkaline Phosphatase: 68 U/L (ref 38–126)
Anion gap: 12 (ref 5–15)
BUN: 50 mg/dL — ABNORMAL HIGH (ref 8–23)
CO2: 19 mmol/L — ABNORMAL LOW (ref 22–32)
Calcium: 9.9 mg/dL (ref 8.9–10.3)
Chloride: 101 mmol/L (ref 98–111)
Creatinine, Ser: 1.89 mg/dL — ABNORMAL HIGH (ref 0.44–1.00)
GFR, Estimated: 28 mL/min — ABNORMAL LOW (ref >60)
Glucose, Bld: 520 mg/dL (ref 70–99)
Potassium: 4.1 mmol/L (ref 3.5–5.1)
Sodium: 132 mmol/L — ABNORMAL LOW (ref 135–145)
Total Bilirubin: 0.3 mg/dL (ref 0.3–1.2)
Total Protein: 6.4 g/dL — ABNORMAL LOW (ref 6.5–8.1)

## 2021-12-20 LAB — URINALYSIS, ROUTINE W REFLEX MICROSCOPIC
Bilirubin Urine: NEGATIVE
Glucose, UA: >1000 mg/dL — AB
Hgb urine dipstick: NEGATIVE
Ketones, ur: NEGATIVE mg/dL
Leukocytes,Ua: NEGATIVE
Nitrite: NEGATIVE
Protein, ur: NEGATIVE mg/dL
Specific Gravity, Urine: 1.032 — ABNORMAL HIGH (ref 1.005–1.030)
pH: 5 (ref 5.0–8.0)

## 2021-12-20 LAB — CBC
HCT: 41.7 % (ref 36.0–46.0)
Hemoglobin: 13.6 g/dL (ref 12.0–15.0)
MCH: 30.4 pg (ref 26.0–34.0)
MCHC: 32.6 g/dL (ref 30.0–36.0)
MCV: 93.3 fL (ref 80.0–100.0)
Platelets: 233 10*3/uL (ref 150–400)
RBC: 4.47 MIL/uL (ref 3.87–5.11)
RDW: 12.8 % (ref 11.5–15.5)
WBC: 12.1 10*3/uL — ABNORMAL HIGH (ref 4.0–10.5)
nRBC: 0 % (ref 0.0–0.2)

## 2021-12-20 LAB — CBG MONITORING, ED
Glucose-Capillary: 387 mg/dL — ABNORMAL HIGH (ref 70–99)
Glucose-Capillary: 281 mg/dL — ABNORMAL HIGH (ref 70–99)
Glucose-Capillary: 144 mg/dL — ABNORMAL HIGH (ref 70–99)

## 2021-12-20 MED ORDER — DEXTROSE 50 % IV SOLN: 0.0000 mL | INTRAVENOUS | Status: DC | PRN

## 2021-12-20 MED ORDER — INSULIN REGULAR(HUMAN) IN NACL 100-0.9 UT/100ML-% IV SOLN
INTRAVENOUS | Status: DC
Start: 2021-12-20 — End: 2021-12-20
  Administered 2021-12-20: 12 [IU]/h via INTRAVENOUS
  Filled 2021-12-20: qty 100

## 2021-12-20 MED ORDER — SODIUM CHLORIDE 0.9 % IV BOLUS
1000.0000 mL | Freq: Once | INTRAVENOUS | Status: AC
  Administered 2021-12-20: 1000 mL via INTRAVENOUS

## 2021-12-20 NOTE — Telephone Encounter (Signed)
Called and spoke to the pt husband he advised the pt is at the [facility] facility  being elevated due to her blood sugar being 500

## 2021-12-20 NOTE — ED Provider Notes (Signed)
[facility] EMERGENCY DEPT Provider Note   CSN: [id number] Arrival date & time: 12/20/21  1155     History  Chief Complaint  Patient presents with   Hyperglycemia    [facility] is a 71 y.o. female.  Patient presents to the hospital with chief complaint of hyperglycemia.  Patient states that she has noticed increasing glucose levels over the past 2 weeks since being administered what sounds like a steroid injection in the spine.  Patient states that she felt slightly lightheaded earlier and had to sit down and the primary care recommended she come to the emergency department for evaluation.  Patient currently complains of no abdominal pain, chest pain, shortness of breath, nausea, altered mental status, dysuria.  Chief complaint is hyperglycemia.  Past medical history significant for diabetes mellitus, hypertension, obesity, pulmonary hypertension, anxiety, major depressive disorder, edema of the extremities, congestive heart failure  HPI     Home Medications Prior to Admission medications   Medication Sig Start Date End Date Taking? Authorizing Provider  albuterol (VENTOLIN HFA) 108 (90 Base) MCG/ACT inhaler Inhale 1-2 puffs into the lungs every 6 (six) hours as needed for wheezing or shortness of breath.    [provider]  Alcohol Swabs (ALCOHOL PREP) PADS Pt uses an alcohol pad each time sugars are tested. Pt tests twice daily. Dx E11.9 02/06/17   Sheliah Hatch, MD  amLODipine (NORVASC) 10 MG tablet TAKE 1 TABLET BY MOUTH EVERY DAY 08/22/21   Sheliah Hatch, MD  atorvastatin (LIPITOR) 40 MG tablet TAKE 1 TABLET BY MOUTH EVERY DAY 08/22/21   Sheliah Hatch, MD  B Complex Vitamins (B COMPLEX PO) Take 1 tablet by mouth daily.    [provider]  buPROPion (WELLBUTRIN XL) 300 MG 24 hr tablet Take 1 tablet (300 mg total) by mouth daily. 10/24/21   Sheliah Hatch, MD  Calcium Carb-Cholecalciferol [phone] MG-UNIT CAPS Take 1 tablet by  mouth 2 (two) times daily.    [provider]  carvedilol (COREG) 25 MG tablet TAKE 1 TABLET BY MOUTH TWICE A DAY 01/24/21   [place], [name], MD  dapagliflozin propanediol (FARXIGA) 10 MG TABS tablet Take 10 mg by mouth daily.    [provider]  fish oil-omega-3 fatty acids 1000 MG capsule Take 1 g by mouth daily.    [provider]  fluticasone (FLONASE) 50 MCG/ACT nasal spray SPRAY 2 SPRAYS INTO EACH NOSTRIL EVERY DAY Patient taking differently: Place 2 sprays into both nostrils daily. 03/26/18   Sheliah Hatch, MD  furosemide (LASIX) 20 MG tablet TAKE 1 TABLET BY MOUTH EVERY DAY 08/22/21   Sheliah Hatch, MD  glipiZIDE (GLUCOTROL XL) 10 MG 24 hr tablet Take 1 tablet (10 mg total) by mouth daily with breakfast. 10/24/21   Sheliah Hatch, MD  glucose blood (FREESTYLE LITE) test strip USE TO TEST BLOOD GLUCOSE 2 TIMES DAILY. Dx E11.9 10/16/17   Sheliah Hatch, MD  loratadine (CLARITIN) 10 MG tablet TAKE 1 TABLET BY MOUTH EVERY DAY Patient taking differently: Take 10 mg by mouth daily. 02/06/18   Sheliah Hatch, MD  Melatonin 3 MG TABS Take 10 mg by mouth at bedtime.     [provider]  methocarbamol (ROBAXIN) 500 MG tablet Take by mouth.    [provider]  Multiple Vitamins-Minerals (VISION FORMULA) TABS Take 1 tablet by mouth daily.    [provider]  OVER THE COUNTER MEDICATION Take 1 tablet by mouth  daily. Equate Vision Formula 50+    [provider]  Semaglutide, 1 MG/DOSE, 4 MG/3ML SOPN Inject 1 mg as directed once a week. 09/18/21   Janeece Agee, NP  sertraline (ZOLOFT) 50 MG tablet TAKE 1 TABLET BY MOUTH EVERY DAY 10/03/21   Janeece Agee, NP  traMADol (ULTRAM) 50 MG tablet Take 1 tablet (50 mg total) by mouth every 6 (six) hours as needed. 01/25/20   Cristie Hem, PA-C  traMADol (ULTRAM) 50 MG tablet Take 1 tablet by mouth every 6 (six) hours as needed. 11/22/21   [provider]  TRUEPLUS  LANCETS 30G MISC Use one lancet each time sugars are tested. Pt tests twice daily. Dx. E11.9 02/06/17   Sheliah Hatch, MD  UNABLE TO FIND Med Name: Healthwise talking meter 08/31/19   Shamleffer, Konrad Dolores, MD      Allergies    Patient has no known allergies.    Review of Systems   Review of Systems  Constitutional:  Negative for fever.  Respiratory:  Negative for shortness of breath.   Cardiovascular:  Negative for chest pain.  Gastrointestinal:  Negative for abdominal pain, nausea and vomiting.  Genitourinary:  Negative for dysuria.  Neurological:  Positive for light-headedness. Negative for syncope, speech difficulty, weakness, numbness and headaches.    Physical Exam Updated Vital Signs BP (!) 144/66   Pulse 66   Temp 98 F (36.7 C) (Temporal)   Resp 18   Ht 5' (1.524 m)   Wt 75.3 kg   LMP 06/25/2008   SpO2 96%   BMI 32.42 kg/m  Physical Exam Vitals and nursing note reviewed.  Constitutional:      General: She is not in acute distress. HENT:     Head: Normocephalic and atraumatic.  Eyes:     Conjunctiva/sclera: Conjunctivae normal.  Cardiovascular:     Rate and Rhythm: Normal rate and regular rhythm.     Pulses: Normal pulses.     Heart sounds: Normal heart sounds.  Pulmonary:     Effort: Pulmonary effort is normal.     Breath sounds: Normal breath sounds.  Abdominal:     Palpations: Abdomen is soft.     Tenderness: There is no abdominal tenderness.  Musculoskeletal:        General: Normal range of motion.     Cervical back: Normal range of motion and neck supple.  Skin:    General: Skin is warm and dry.     Capillary Refill: Capillary refill takes less than 2 seconds.  Neurological:     Mental Status: She is alert and oriented to person, place, and time.     Sensory: Sensation is intact.     Motor: Motor function is intact.     Coordination: Coordination is intact.     Comments: CN II through VII, XI, XII intact     ED Results / Procedures  / Treatments   Labs (all labs ordered are listed, but only abnormal results are displayed) Labs Reviewed  CBC - Abnormal; Notable for the following components:      Result Value   WBC 12.1 (*)    All other components within normal limits  URINALYSIS, ROUTINE W REFLEX MICROSCOPIC - Abnormal; Notable for the following components:   Specific Gravity, Urine 1.032 (*)    Glucose, UA >1,000 (*)    All other components within normal limits  COMPREHENSIVE METABOLIC PANEL - Abnormal; Notable for the following components:   Sodium 132 (*)  CO2 19 (*)    Glucose, Bld 520 (*)    BUN 50 (*)    Creatinine, Ser 1.89 (*)    Total Protein 6.4 (*)    ALT 82 (*)    GFR, Estimated 28 (*)    All other components within normal limits  CBG MONITORING, ED - Abnormal; Notable for the following components:   Glucose-Capillary 387 (*)    All other components within normal limits  CBG MONITORING, ED - Abnormal; Notable for the following components:   Glucose-Capillary 281 (*)    All other components within normal limits    EKG EKG Interpretation  Date/Time:  Wednesday December 20 2021 12:20:30 EDT Ventricular Rate:  70 PR Interval:  156 QRS Duration: 94 QT Interval:  386 QTC Calculation: 416 R Axis:   143 Text Interpretation: Normal sinus rhythm Left posterior fascicular block Anteroseptal infarct , age undetermined Abnormal ECG When compared with ECG of 03-Mar-2019 16:50, No significant change since last tracing Confirmed by Meridee Score [id number]) on 12/20/2021 12:35:21 PM  Radiology No results found.  Procedures Procedures    Medications Ordered in ED Medications  insulin regular, human (MYXREDLIN) 100 units/ 100 mL infusion (9 Units/hr Intravenous Rate/Dose Change 12/20/21 1521)  dextrose 50 % solution 0-50 mL (has no administration in time range)  sodium chloride 0.9 % bolus 1,000 mL (1,000 mLs Intravenous New Bag/Given 12/20/21 1350)    ED Course/ Medical Decision Making/ A&P                            Medical Decision Making Amount and/or Complexity of Data Reviewed Labs: ordered.  Risk Prescription drug management.   The patient presents today with a chief complaint of hyperglycemia.  Causes on differential include reaction to steroid injection, underlying illness, worsening underlying diabetes, poor response to medication  I ordered an EKG which showed an underlying rhythm of normal sinus rhythm  I ordered the patient insulin utilizing Endo tool for hyperglycemia.  The patient's glucose levels improved dramatically while she was here.  I reviewed patient's past medical history including charts showing visits to family medicine for management of her type 2 diabetes  The patient has had no recent decrease in her diabetic medications.  Currently takes Ozempic and glipizide.  I doubt this is a failure of therapy.  Work-up shows very mild white count which I believe is due to to the recent steroid injection.  No symptoms to suggest underlying illness.  Patient is afebrile  This could be a worsening of the patient's underlying illness but I feel is most likely a sustained reaction due to the recent steroid administration.  This will take time to normalize.  Plan to discharge patient home and have her follow-up with her primary care for further advice on medication        Final Clinical Impression(s) / ED Diagnoses Final diagnoses:  Hyperglycemia due to diabetes mellitus [facility])    Rx / DC Orders ED Discharge Orders     None         Pamala Duffel 12/20/21 1546    Terrilee Files, MD 12/20/21 Silva Bandy

## 2021-12-20 NOTE — Discharge Instructions (Signed)
You were seen today for high blood sugar.  This is likely due to the recent injection you had in your spine.  Your glucose has been reduced using insulin while here at the hospital.  Recommend that you follow-up with your primary care for further management of your glucose.  If you experience altered level of consciousness, chest pain, shortness of breath, or other life threats, please return to the emergency department for reevaluation

## 2021-12-20 NOTE — Telephone Encounter (Signed)
Pt stated today she got dizzy at the grocery store and fell, she stated she did hit her head as she caught herself but cut her lip. When she got home she checked her blood sugar and it was 513. She wanted to make an appt to be seen this week. Advised pt to speak with a nurse first and transferred to triage.

## 2021-12-20 NOTE — ED Triage Notes (Signed)
Pt arrives to ED with c/o hyperglycemia and near syncope. Pt reports that she was using the bathroom today, became lightheaded, and fell. Pt reports she did not lose consciousness and caught herself before falling to the ground. She reports hitting her lip on the toilet. Pt reports her blood sugar was 531. Pt reports she is receiving pain shots in her back over the past two weeks which have increased her average blood sugar.

## 2021-12-21 NOTE — Telephone Encounter (Signed)
Spoke w/ pt and advised we are scheduling her a f/u apt

## 2021-12-21 NOTE — Telephone Encounter (Signed)
Sometime next week if possible.  I read the ER report and they feel that her high sugars are directly related to a recent steroid injection.  She needs to increase her water intake and be very careful about her carb/sugar intake

## 2021-12-22 ENCOUNTER — Ambulatory Visit (INDEPENDENT_AMBULATORY_CARE_PROVIDER_SITE_OTHER): Payer: Medicare Other | Admitting: Adult Health

## 2021-12-22 ENCOUNTER — Encounter: Payer: Self-pay | Admitting: Adult Health

## 2021-12-22 VITALS — BP 110/70 | HR 71 | Ht 59.0 in | Wt 166.6 lb

## 2021-12-22 DIAGNOSIS — I5032 Chronic diastolic (congestive) heart failure: Secondary | ICD-10-CM

## 2021-12-22 DIAGNOSIS — I10 Essential (primary) hypertension: Secondary | ICD-10-CM | POA: Diagnosis not present

## 2021-12-22 DIAGNOSIS — Z794 Long term (current) use of insulin: Secondary | ICD-10-CM | POA: Diagnosis not present

## 2021-12-22 DIAGNOSIS — E1165 Type 2 diabetes mellitus with hyperglycemia: Secondary | ICD-10-CM

## 2021-12-22 NOTE — Progress Notes (Signed)
Cardiology Clinic Note   Patient Name: Kayla Brown Date of Encounter: 12/22/2021  Primary Care Provider:  [name], MD Primary Cardiologist:  Dr. [name]   Patient Profile    71 year old female with history chronic diastolic CHF, chronic dyspnea, OSA, mild pulmonary hypertension, hypertension, dyslipidemia, with additional history and 03/04/2019 of acute PE, acute kidney injury from overdiuresis in the setting of hypoxic respiratory failure and pulmonary edema.   Past Medical History    Past Medical History:  Diagnosis Date   Anxiety    Congestive heart failure (CHF) ([place])    Diabetes mellitus    Edema of extremities 03/02/2016   Essential hypertension, benign    Lazy eye    Major depressive disorder, single episode, unspecified    Obesity (BMI 30-39.9) 03/01/2016   OSA (obstructive sleep apnea)    Pulmonary HTN (HCC)    mild with PASP 18mHg by echo 05/2016   Pure hypercholesterolemia    Retinopathy, due to hypertension    Past Surgical History:  Procedure Laterality Date   CPAP TITRATION  10/14/2015   DILATION AND CURETTAGE OF UTERUS  2005-2008   x2   EYE SURGERY     cataracts   WISDOM TOOTH EXTRACTION      Allergies  No Known Allergies  History of Present Illness     Recently seen in ED for hyperglycemia.  Causes on differential include reaction to steroid injection, underlying illness, worsening underlying diabetes, poor response to medication.   She comes today with main complaint of constipation.  She has had medication adjustments completed posthospitalization.  She is due to follow-up with her primary care for continued management of her diabetes.  She is medically compliant.  Denies any volume overload shortness of breath or chest pressure.  2   Home Medications    Current Outpatient Medications  Medication Sig Dispense Refill   albuterol (VENTOLIN HFA) 108 (90 Base) MCG/ACT inhaler Inhale 1-2 puffs into the lungs every 6 (six) hours as  needed for wheezing or shortness of breath.     Alcohol Swabs (ALCOHOL PREP) PADS Pt uses an alcohol pad each time sugars are tested. Pt tests twice daily. Dx E11.9 100 each 3   amLODipine (NORVASC) 10 MG tablet TAKE 1 TABLET BY MOUTH EVERY DAY 90 tablet 1   atorvastatin (LIPITOR) 40 MG tablet TAKE 1 TABLET BY MOUTH EVERY DAY 90 tablet 1   B Complex Vitamins (B COMPLEX PO) Take 1 tablet by mouth daily.     buPROPion (WELLBUTRIN XL) 300 MG 24 hr tablet Take 1 tablet (300 mg total) by mouth daily. 90 tablet 1   Calcium Carb-Cholecalciferol [phone] MG-UNIT CAPS Take 1 tablet by mouth 2 (two) times daily.     carvedilol (COREG) 25 MG tablet TAKE 1 TABLET BY MOUTH TWICE A DAY 180 tablet 3   dapagliflozin propanediol (FARXIGA) 10 MG TABS tablet Take 10 mg by mouth daily.     fish oil-omega-3 fatty acids 1000 MG capsule Take 1 g by mouth daily.     fluticasone (FLONASE) 50 MCG/ACT nasal spray SPRAY 2 SPRAYS INTO EACH NOSTRIL EVERY DAY (Patient taking differently: Place 2 sprays into both nostrils daily.) 18 g 1   furosemide (LASIX) 20 MG tablet TAKE 1 TABLET BY MOUTH EVERY DAY 90 tablet 1   glipiZIDE (GLUCOTROL XL) 10 MG 24 hr tablet Take 1 tablet (10 mg total) by mouth daily with breakfast. 90 tablet 1   glucose blood (FREESTYLE LITE) test strip USE TO  TEST BLOOD GLUCOSE 2 TIMES DAILY. Dx E11.9 100 each 12   loratadine (CLARITIN) 10 MG tablet TAKE 1 TABLET BY MOUTH EVERY DAY (Patient taking differently: Take 10 mg by mouth daily.) 30 tablet 11   Melatonin 3 MG TABS Take 10 mg by mouth at bedtime.      methocarbamol (ROBAXIN) 500 MG tablet Take by mouth.     Multiple Vitamins-Minerals (VISION FORMULA) TABS Take 1 tablet by mouth daily.     OVER THE COUNTER MEDICATION Take 1 tablet by mouth daily. Equate Vision Formula 50+     Semaglutide, 1 MG/DOSE, 4 MG/3ML SOPN Inject 1 mg as directed once a week. 9 mL 0   sertraline (ZOLOFT) 50 MG tablet TAKE 1 TABLET BY MOUTH EVERY DAY 90 tablet 1   traMADol  (ULTRAM) 50 MG tablet Take 1 tablet by mouth every 6 (six) hours as needed.     TRUEPLUS LANCETS 30G MISC Use one lancet each time sugars are tested. Pt tests twice daily. Dx. E11.9 100 each 3   UNABLE TO FIND Med Name: Healthwise talking meter 1 kit 0   No current facility-administered medications for this visit.     Family History    Family History  Adopted: Yes  Problem Relation Age of Onset   Alcohol abuse Mother    ADD / ADHD Mother    Dementia Mother    Breast cancer Neg Hx    is adopted.   Social History    Social History   Socioeconomic History   Marital status: Married    Spouse name: [name]   Number of children: Not on file   Years of education: Not on file   Highest education level: 12th grade  Occupational History   Occupation: retired  Tobacco Use   Smoking status: Never   Smokeless tobacco: Never  Vaping Use   Vaping Use: Never used  Substance and Sexual Activity   Alcohol use: No    Alcohol/week: 0.0 standard drinks of alcohol   Drug use: No   Sexual activity: Yes    Partners: Male    Birth control/protection: None  Other Topics Concern   Not on file  Social History Narrative   Not on file   Social Determinants of Health   Financial Resource Strain: Low Risk  (12/12/2020)   Overall Financial Resource Strain (CARDIA)    Difficulty of Paying Living Expenses: Not very hard  Food Insecurity: No Food Insecurity (09/30/2020)   Hunger Vital Sign    Worried About Running Out of Food in the Last Year: Never true    Ran Out of Food in the Last Year: Never true  Transportation Needs: No Transportation Needs (12/12/2020)   PRAPARE - Hydrologist (Medical): No    Lack of Transportation (Non-Medical): No  Physical Activity: Insufficiently Active (08/10/2020)   Exercise Vital Sign    Days of Exercise per Week: 3 days    Minutes of Exercise per Session: 10 min  Stress: No Stress Concern Present (08/10/2020)   [place]    Feeling of Stress : Only a little  Social Connections: Moderately Integrated (12/12/2020)   Social Connection and Isolation Panel [NHANES]    Frequency of Communication with Friends and Family: Twice a week    Frequency of Social Gatherings with Friends and Family: Once a week    Attends Religious Services: More than 4 times per year  Active Member of Clubs or Organizations: No    Attends Archivist Meetings: Never    Marital Status: Married  Human resources officer Violence: Not At Risk (12/12/2020)   Humiliation, Afraid, Rape, and Kick questionnaire    Fear of Current or Ex-Partner: No    Emotionally Abused: No    Physically Abused: No    Sexually Abused: No     Review of Systems    General:  No chills, fever, night sweats or weight changes.  Cardiovascular:  No chest pain, dyspnea on exertion, edema, orthopnea, palpitations, paroxysmal nocturnal dyspnea. Dermatological: No rash, lesions/masses Respiratory: No cough, dyspnea Urologic: No hematuria, dysuria Abdominal:   No nausea, vomiting, diarrhea, bright red blood per rectum, melena, or hematemesis Neurologic:  No visual changes, wkns, changes in mental status. All other systems reviewed and are otherwise negative except as noted above.     Physical Exam    VS:  BP 110/70 (BP Location: Left Arm)   Pulse 71   Ht 4' 11"  (1.499 m)   Wt 166 lb 9.6 oz (75.6 kg)   LMP 06/25/2008   SpO2 96%   BMI 33.65 kg/m  , BMI Body mass index is 33.65 kg/m.     GEN: Well nourished, well developed, in no acute distress. HEENT: normal. Neck: Supple, no JVD, carotid bruits, or masses. Cardiac: RRR,  1/6 murmurs, rubs, or gallops. No clubbing, cyanosis, edema.  Radials/DP/PT 2+ and equal bilaterally.  Respiratory:  Respirations regular and unlabored, clear to auscultation bilaterally. GI: Soft, nontender, nondistended, BS + x 4. MS: no deformity or atrophy. Skin: warm and  dry, no rash. Neuro:  Strength and sensation are intact. Psych: Normal affect.  Accessory Clinical Findings      Lab Results  Component Value Date   WBC 12.1 (H) 12/20/2021   HGB 13.6 12/20/2021   HCT 41.7 12/20/2021   MCV 93.3 12/20/2021   PLT 233 12/20/2021   Lab Results  Component Value Date   CREATININE 1.89 (H) 12/20/2021   BUN 50 (H) 12/20/2021   NA 132 (L) 12/20/2021   K 4.1 12/20/2021   CL 101 12/20/2021   CO2 19 (L) 12/20/2021   Lab Results  Component Value Date   ALT 82 (H) 12/20/2021   AST 23 12/20/2021   ALKPHOS 68 12/20/2021   BILITOT 0.3 12/20/2021   Lab Results  Component Value Date   CHOL 143 10/24/2021   HDL 51.10 10/24/2021   LDLCALC 57 10/24/2021   LDLDIRECT 50.0 05/23/2020   TRIG 179.0 (H) 10/24/2021   CHOLHDL 3 10/24/2021    Lab Results  Component Value Date   HGBA1C 8.6 (H) 10/24/2021    Review of Prior Studies: Echocardiogram 03/06/2019 1. The left ventricle has hyperdynamic systolic function, with an  ejection fraction of >65%. The cavity size was normal. There is moderate  concentric left ventricular hypertrophy. Left ventricular diastolic  Doppler parameters are consistent with  impaired relaxation. Elevated mean left atrial pressure No evidence of  left ventricular regional wall motion abnormalities.   2. The right ventricle has normal systolic function. The cavity was  normal. There is no increase in right ventricular wall thickness. Right  ventricular systolic pressure could not be assessed.   3. Left atrial size was mildly dilated.   4. Trivial pericardial effusion is present.   5. The aorta is normal unless otherwise noted.   Assessment & Plan   1.  Chronic diastolic heart failure: No evidence of volume overload today.  Weight has been stable in fact she is losing weight as she is recently been placed on Ozempic.  Weight has dropped from 172 and she is now hovering around 160 to 161 pounds.  May need to reevaluate dry weight  each time we see her if she is going to continue to lose weight.  2.  Hypertension: Blood pressures well controlled today.  We will continue current medication regimen which will include amlodipine 10 mg daily furosemide 20 mg daily.  May need to adjust blood pressure medications to avoid hypotension as she begins to lose weight.  3.  Non-insulin-dependent diabetes: Had injection of lumbar spine with significant spike in blood glucose and then hypoglycemia.  Now being followed by PCP more closely with adjustments of medications at their discretion and direction.  4.  Constipation: Advised to avoid laxatives which can cause cramping and pain and to use stool softener, increase fluid consumption to 4 bottles total a day.   Current medicines are reviewed at length with the patient today.  I have spent 25 min's  dedicated to the care of this patient on the date of this encounter to include pre-visit review of records, assessment, management and diagnostic testing,with shared decision making. Signed, Phill Myron. West Pugh, ANP, AACC   12/22/2021 4:49 PM    [facility] [place] [address] Office [phone] Fax [phone]  Notice: This dictation was prepared with Dragon dictation along with smaller phrase technology. Any transcriptional errors that result from this process are unintentional and may not be corrected upon review.

## 2021-12-22 NOTE — Patient Instructions (Signed)
Medication Instructions:  No Changes *If you need a refill on your cardiac medications before your next appointment, please call your pharmacy*   Lab Work: No Labs If you have labs (blood work) drawn today and your tests are completely normal, you will receive your results only by: MyChart Message (if you have MyChart) OR A paper copy in the mail If you have any lab test that is abnormal or we need to change your treatment, we will call you to review the results.   Testing/Procedures: No Testing   Follow-Up: At [facility], you and your health needs are our priority.  As part of our continuing mission to provide you with exceptional heart care, we have created designated Provider Care Teams.  These Care Teams include your primary Cardiologist (physician) and Advanced Practice Providers (APPs -  Physician Assistants and Nurse Practitioners) who all work together to provide you with the care you need, when you need it.  We recommend signing up for the patient portal called "MyChart".  Sign up information is provided on this After Visit Summary.  MyChart is used to connect with patients for Virtual Visits (Telemedicine).  Patients are able to view lab/test results, encounter notes, upcoming appointments, etc.  Non-urgent messages can be sent to your provider as well.   To learn more about what you can do with MyChart, go to [email or web address].    Your next appointment:   3 month(s)  The format for your next appointment:   In Person  Provider:   Joni Reining, DNP      Important Information About Sugar

## 2021-12-27 ENCOUNTER — Ambulatory Visit (INDEPENDENT_AMBULATORY_CARE_PROVIDER_SITE_OTHER): Payer: Medicare Other

## 2021-12-27 DIAGNOSIS — I10 Essential (primary) hypertension: Secondary | ICD-10-CM

## 2021-12-27 DIAGNOSIS — I5032 Chronic diastolic (congestive) heart failure: Secondary | ICD-10-CM

## 2021-12-27 DIAGNOSIS — N1832 Chronic kidney disease, stage 3b: Secondary | ICD-10-CM

## 2021-12-27 NOTE — Progress Notes (Unsigned)
Subjective:     Patient ID: Kayla Brown, female    DOB: October 20, 1950, 71 y.o.   MRN: [id number]  No chief complaint on file.   HPI: Follow up Hospitalization:  Patient was admitted to *** on *** and discharged on ***. She was treated for ***. Treatment for this included ***. Telephone follow up was done on ***. She reports {excellent/good/fair:19992} compliance with treatment. She reports this condition is {resolved/improved/worsened:23923}. T2DM- chronic problem, on Farxiga 4m daily, Glipizide 142mBID, Ozempic 88m66meekly.  Last A1C 7.8% on 11/28.  UTD on foot exam.  UTD on microalbumin.  UTD on eye exam w/ Dr BowValetta Closeill get records)  Pt reports sugars have decreased somewhat since increasing Ozempic.  Sugars are now primarily running 130-200.  Denies symptomatic lows.  No numbness/tingling of hands/feet.  Assessment & Plan:   Problem List Items Addressed This Visit   None   Outpatient Medications Prior to Visit  Medication Sig Dispense Refill  . albuterol (VENTOLIN HFA) 108 (90 Base) MCG/ACT inhaler Inhale 1-2 puffs into the lungs every 6 (six) hours as needed for wheezing or shortness of breath.    . Alcohol Swabs (ALCOHOL PREP) PADS Pt uses an alcohol pad each time sugars are tested. Pt tests twice daily. Dx E11.9 100 each 3  . amLODipine (NORVASC) 10 MG tablet TAKE 1 TABLET BY MOUTH EVERY DAY 90 tablet 1  . atorvastatin (LIPITOR) 40 MG tablet TAKE 1 TABLET BY MOUTH EVERY DAY 90 tablet 1  . B Complex Vitamins (B COMPLEX PO) Take 1 tablet by mouth daily.    . bMarland KitchenPROPion (WELLBUTRIN XL) 300 MG 24 hr tablet Take 1 tablet (300 mg total) by mouth daily. 90 tablet 1  . Calcium Carb-Cholecalciferol [phone] MG-UNIT CAPS Take 1 tablet by mouth 2 (two) times daily.    . carvedilol (COREG) 25 MG tablet TAKE 1 TABLET BY MOUTH TWICE A DAY 180 tablet 3  . dapagliflozin propanediol (FARXIGA) 10 MG TABS tablet Take 10 mg by mouth daily.    . fish oil-omega-3 fatty acids 1000 MG capsule Take  1 g by mouth daily.    . fluticasone (FLONASE) 50 MCG/ACT nasal spray SPRAY 2 SPRAYS INTO EACH NOSTRIL EVERY DAY (Patient taking differently: Place 2 sprays into both nostrils daily.) 18 g 1  . furosemide (LASIX) 20 MG tablet TAKE 1 TABLET BY MOUTH EVERY DAY 90 tablet 1  . glipiZIDE (GLUCOTROL XL) 10 MG 24 hr tablet Take 1 tablet (10 mg total) by mouth daily with breakfast. 90 tablet 1  . glucose blood (FREESTYLE LITE) test strip USE TO TEST BLOOD GLUCOSE 2 TIMES DAILY. Dx E11.9 100 each 12  . loratadine (CLARITIN) 10 MG tablet TAKE 1 TABLET BY MOUTH EVERY DAY (Patient taking differently: Take 10 mg by mouth daily.) 30 tablet 11  . Melatonin 3 MG TABS Take 10 mg by mouth at bedtime.     . methocarbamol (ROBAXIN) 500 MG tablet Take by mouth.    . Multiple Vitamins-Minerals (VISION FORMULA) TABS Take 1 tablet by mouth daily.    . OMarland KitchenER THE COUNTER MEDICATION Take 1 tablet by mouth daily. Equate Vision Formula 50+    . Semaglutide, 1 MG/DOSE, 4 MG/3ML SOPN Inject 1 mg as directed once a week. 9 mL 0  . sertraline (ZOLOFT) 50 MG tablet TAKE 1 TABLET BY MOUTH EVERY DAY 90 tablet 1  . traMADol (ULTRAM) 50 MG tablet Take 1 tablet by mouth every 6 (six) hours as needed.    .[name]  TRUEPLUS LANCETS 30G MISC Use one lancet each time sugars are tested. Pt tests twice daily. Dx. E11.9 100 each 3  . UNABLE TO FIND Med Name: Healthwise talking meter 1 kit 0   No facility-administered medications prior to visit.    Past Medical History:  Diagnosis Date  . Anxiety   . Congestive heart failure (CHF) ([place])   . Diabetes mellitus   . Edema of extremities 03/02/2016  . Essential hypertension, benign   . Lazy eye   . Major depressive disorder, single episode, unspecified   . Obesity (BMI 30-39.9) 03/01/2016  . OSA (obstructive sleep apnea)   . Pulmonary HTN ([place])    mild with PASP 65mHg by echo 05/2016  . Pure hypercholesterolemia   . Retinopathy, due to hypertension     Past Surgical History:  Procedure  Laterality Date  . CPAP TITRATION  10/14/2015  . DILATION AND CURETTAGE OF UTERUS  2005-2008   x2  . EYE SURGERY     cataracts  . WISDOM TOOTH EXTRACTION      No Known Allergies     Objective:    Physical Exam Vitals and nursing note reviewed.  Constitutional:      Appearance: Normal appearance. She is obese.  Cardiovascular:     Rate and Rhythm: Normal rate and regular rhythm.  Pulmonary:     Effort: Pulmonary effort is normal.     Breath sounds: Normal breath sounds.  Musculoskeletal:        General: Normal range of motion.  Skin:    General: Skin is warm and dry.  Neurological:     Mental Status: She is alert.  Psychiatric:        Mood and Affect: Mood normal.        Behavior: Behavior normal.   LMP 06/25/2008  Wt Readings from Last 3 Encounters:  12/22/21 166 lb 9.6 oz (75.6 kg)  12/20/21 166 lb (75.3 kg)  10/24/21 174 lb 6.4 oz (79.1 kg)        No orders of the defined types were placed in this encounter.   HJeanie Sewer NP

## 2021-12-27 NOTE — Patient Instructions (Signed)
Visit Information  Thank you for taking time to visit with me today. Please don't hesitate to contact me if I can be of assistance to you before our next scheduled telephone appointment.  Following are the goals we discussed today:  Take all medications as prescribed Attend all scheduled provider appointments Call pharmacy for medication refills 3-7 days in advance of running out of medications Call provider office for new concerns or questions  call office if I gain more than 2 pounds in one day or 5 pounds in one week keep legs up while sitting use salt in moderation watch for swelling in feet, ankles and legs every day weigh myself daily follow rescue plan if symptoms flare-up eat more whole grains, fruits and vegetables, lean meats and healthy fats keep appointment with eye doctor check blood sugar at prescribed times: twice daily and when you have symptoms of low or high blood sugar check feet daily for cuts, sores or redness drink 6 to 8 glasses of water each day fill half of plate with vegetables manage portion size switch to low-fat or skim milk switch to sugar-free drinks keep feet up while sitting wear comfortable, cotton socks check blood pressure daily choose a place to take my blood pressure (home, clinic or office, retail store) take blood pressure log to all doctor appointments take medications for blood pressure exactly as prescribed eat more whole grains, fruits and vegetables, lean meats and healthy fats limit salt intake to 2300mg /day call for medicine refill 2 or 3 days before it runs out take all medications exactly as prescribed call doctor with any symptoms you believe are related to your medicine  Our next appointment is by telephone on 01/31/22 at 3 PM  Please call the care guide team at [phone] if you need to cancel or reschedule your appointment.   If you are experiencing a Mental Health or Behavioral Health Crisis or need someone to talk to,  please call the Suicide and Crisis Lifeline: 988 call the 07-01-1986 National Suicide Prevention Lifeline: 6473374538 or TTY: 253 242 6137 TTY 409-276-2255) to talk to a trained counselor call 1-800-273-TALK (toll free, 24 hour hotline) go to [facility] [address], [place] [phone]) call 911   Patient verbalizes understanding of instructions and care plan provided today and agrees to view in MyChart. Active MyChart status and patient understanding of how to access instructions and care plan via MyChart confirmed with patient.     ([phone] RN, Dudley Major, CDE Care Management Coordinator [facility] [phone]

## 2021-12-27 NOTE — Chronic Care Management (AMB) (Signed)
Chronic Care Management   CCM RN Visit Note  12/27/2021 Name: Kayla Brown MRN: [id number] DOB: 11-Feb-1951  Subjective: Kayla Brown is a 71 y.o. year old female who is a primary care patient of Birdie Riddle, [name], MD. The care management team was consulted for assistance with disease management and care coordination needs.    Engaged with patient by telephone for follow up visit in response to provider referral for case management and/or care coordination services.   Consent to Services:  The patient was given information about Chronic Care Management services, agreed to services, and gave verbal consent prior to initiation of services.  Please see initial visit note for detailed documentation.   Patient agreed to services and verbal consent obtained.   Assessment: Review of patient past medical history, allergies, medications, health status, including review of consultants reports, laboratory and other test data, was performed as part of comprehensive evaluation and provision of chronic care management services.   SDOH (Social Determinants of Health) assessments and interventions performed:    CCM Care Plan  No Known Allergies  Outpatient Encounter Medications as of 12/27/2021  Medication Sig Note   albuterol (VENTOLIN HFA) 108 (90 Base) MCG/ACT inhaler Inhale 1-2 puffs into the lungs every 6 (six) hours as needed for wheezing or shortness of breath.    Alcohol Swabs (ALCOHOL PREP) PADS Pt uses an alcohol pad each time sugars are tested. Pt tests twice daily. Dx E11.9    amLODipine (NORVASC) 10 MG tablet TAKE 1 TABLET BY MOUTH EVERY DAY    atorvastatin (LIPITOR) 40 MG tablet TAKE 1 TABLET BY MOUTH EVERY DAY    B Complex Vitamins (B COMPLEX PO) Take 1 tablet by mouth daily.    buPROPion (WELLBUTRIN XL) 300 MG 24 hr tablet Take 1 tablet (300 mg total) by mouth daily.    Calcium Carb-Cholecalciferol [phone] MG-UNIT CAPS Take 1 tablet by mouth 2 (two) times daily.    carvedilol  (COREG) 25 MG tablet TAKE 1 TABLET BY MOUTH TWICE A DAY    dapagliflozin propanediol (FARXIGA) 10 MG TABS tablet Take 10 mg by mouth daily. 01/25/2021: Gets through patient assistance.    fish oil-omega-3 fatty acids 1000 MG capsule Take 1 g by mouth daily.    fluticasone (FLONASE) 50 MCG/ACT nasal spray SPRAY 2 SPRAYS INTO EACH NOSTRIL EVERY DAY (Patient taking differently: Place 2 sprays into both nostrils daily.)    furosemide (LASIX) 20 MG tablet TAKE 1 TABLET BY MOUTH EVERY DAY    glipiZIDE (GLUCOTROL XL) 10 MG 24 hr tablet Take 1 tablet (10 mg total) by mouth daily with breakfast.    glucose blood (FREESTYLE LITE) test strip USE TO TEST BLOOD GLUCOSE 2 TIMES DAILY. Dx E11.9    loratadine (CLARITIN) 10 MG tablet TAKE 1 TABLET BY MOUTH EVERY DAY (Patient taking differently: Take 10 mg by mouth daily.)    Melatonin 3 MG TABS Take 10 mg by mouth at bedtime.     methocarbamol (ROBAXIN) 500 MG tablet Take by mouth.    Multiple Vitamins-Minerals (VISION FORMULA) TABS Take 1 tablet by mouth daily.    OVER THE COUNTER MEDICATION Take 1 tablet by mouth daily. Equate Vision Formula 50+    Semaglutide, 1 MG/DOSE, 4 MG/3ML SOPN Inject 1 mg as directed once a week.    sertraline (ZOLOFT) 50 MG tablet TAKE 1 TABLET BY MOUTH EVERY DAY    traMADol (ULTRAM) 50 MG tablet Take 1 tablet by mouth every 6 (six) hours as needed.  TRUEPLUS LANCETS 30G MISC Use one lancet each time sugars are tested. Pt tests twice daily. Dx. E11.9    UNABLE TO FIND Med Name: [employer]wise talking meter    No facility-administered encounter medications on file as of 12/27/2021.    Patient Active Problem List   Diagnosis Date Noted   Reactive airway disease 01/30/2021   Morbid obesity ([place]) 11/18/2020   Osteopenia 11/18/2020   Type 2 diabetes mellitus with stage 3a chronic kidney disease, without long-term current use of insulin ([place]) 07/21/2020   Type 2 diabetes mellitus with hyperglycemia, with long-term current use of insulin  ([place]) 04/19/2020   Lumbar radiculopathy 03/02/2020   Left hip pain 12/01/2019   Type 2 diabetes mellitus with stage 3b chronic kidney disease, without long-term current use of insulin ([place]) 14/48/1856   Diastolic CHF ([place]) 31/49/7026   Dyspnea 03/08/2019   Pulmonary edema 03/04/2019   Physical exam 02/06/2017   Anxiety and depression 12/05/2016   Obesity (BMI 30-39.9) 03/01/2016   OSA (obstructive sleep apnea) 07/11/2015   Pulmonary hypertension ([place]) 05/06/2015   Heart murmur 03/03/2015   Essential hypertension, benign 03/18/2014   Hyperlipidemia 03/18/2014   Post-menopause on HRT (hormone replacement therapy) 03/18/2014    Conditions to be addressed/monitored:Atrial Fibrillation, CHF, HTN, and DMII  Care Plan : RN Care Manager Plan of Care  Updates made by [name], RN since 12/27/2021 12:00 AM     Problem: Chronic Disease Management and Care Coordination Needs (DM, CHF, CKD,HTN and HLD)   Priority: High     Long-Range Goal: Establish Plan of Care for Chronic Disease Management Needs (DM, CHF, CKD,HTN and HLD)   Start Date: 06/14/2021  Expected End Date: 06/11/2022  Priority: High  Note:   Current Barriers:  Knowledge Deficits related to plan of care for management of CHF, HTN, HLD, DMII, and CKD Stage 3  Care Coordination needs related to Financial constraints related to cost of medications Chronic Disease Management support and education needs related to CHF, HTN, HLD, DMII, and CKD Stage 3  States her blood sugars have been higher and she had to go to the ED because her sugar was up to 531.  States she had steroid shot that made her sugars go up but it had been over 2 weeks and her sugars were not going down.  States she is to see a doctor tomorrow since her sugars are still higher.  States she was 358 this morning and it was 365 last night.  States she thinks she is ready to start insulin.States she is trying to drink more water. States she does not drink anything  with sugar and her diet drinks are clear.  States her weight is down to 160 this morning and she has not needed to take any extra Lasix. Denies any swelling, chest pains or shortness of breath.  States her B/P has been stable.  States her back pain has been better since her back injection. States she is walking 3-4 times a week.  RNCM Clinical Goal(s):  Patient will verbalize understanding of plan for management of CHF, HTN, HLD, DMII, and CKD Stage 3 as evidenced by voiced adherence to plan of care verbalize basic understanding of  CHF, HTN, HLD, DMII, and CKD Stage 3 disease process and self health management plan as evidenced by voiced understanding and teach back take all medications exactly as prescribed and will call provider for medication related questions as evidenced by dispense report and pt verbalization attend all scheduled medical appointments:  Nephrology 01/10/22, cardiology 03/30/22, Pulmonary 07/05/22, Dr. Birdie Riddle 01/25/22 as evidenced by medical records demonstrate Improved adherence to prescribed treatment plan for CHF, HTN, HLD, DMII, and CKD Stage 3 as evidenced by readings within limits, voiced adherence to plan of care continue to work with RN Care Manager to address care management and care coordination needs related to  CHF, HTN, HLD, DMII, and CKD Stage 3 as evidenced by adherence to CM Team Scheduled appointments work with pharmacist to address Financial constraints related to cost of medications related toCHF, HTN, HLD, DMII, and CKD Stage 3 as evidenced by review or EMR and patient or pharmacist report through collaboration with RN Care manager, provider, and care team.   Interventions: 1:1 collaboration with primary care provider regarding development and update of comprehensive plan of care as evidenced by provider attestation and co-signature Inter-disciplinary care team collaboration (see longitudinal plan of care) Evaluation of current treatment plan related to  self  management and patient's adherence to plan as established by provider   Heart Failure Interventions:  (Status:  Goal on track:  Yes.) Long Term Goal Provided education on low sodium diet Reviewed Heart Failure Action Plan in depth and provided written copy Discussed importance of daily weight and advised patient to weigh and record daily Reviewed role of diuretics in prevention of fluid overload and management of heart failure; Discussed the importance of keeping all appointments with provider Advised patient to discuss need for fluid restriction with provider Reviewed s/sx to call provider. Reinforced to take Lasix as needed for weight gain   Chronic Kidney Disease Interventions:  (Status:  Goal on track:  Yes.) Long Term Goal Evaluation of current treatment plan related to chronic kidney disease self management and patient's adherence to plan as established by provider      Reviewed prescribed diet low sodium low CHO Reviewed medications with patient and discussed importance of compliance    Counseled on the importance of exercise goals with target of 150 minutes per week     Discussed complications of poorly controlled blood pressure such as heart disease, stroke, circulatory complications, vision complications, kidney impairment, sexual dysfunction    Discussed plans with patient for ongoing care management follow up and provided patient with direct contact information for care management team    Provided education on kidney disease progression    Reviewed to keep nephrology appointment on 7/19/23Reinforced importance of keep blood sugars under control to help with kidney function Last practice recorded BP readings:  BP Readings from Last 3 Encounters:  12/22/21 110/70  12/20/21 (!) 143/68  10/24/21 112/64  Most recent eGFR/CrCl: No results found for: EGFR  No components found for: CRCL    Diabetes Interventions:  (Status:  Goal on track:  NO.) Long Term Goal Assessed patient's  understanding of A1c goal: <7% Provided education to patient about basic DM disease process Reviewed medications with patient and discussed importance of medication adherence Counseled on importance of regular laboratory monitoring as prescribed Discussed plans with patient for ongoing care management follow up and provided patient with direct contact information for care management team Advised patient, providing education and rationale, to check cbg twice daily and record, calling provider for findings outside established parameters Review of patient status, including review of consultants reports, relevant laboratory and other test results, and medications completed Reviewed to discuss with doctor tomorrow about going on insulin. Discussed types of insulin and how they work. Reinforced how exercise can help lower blood sugars. Reinforced  to eat healthy  protein snacks and to limit her CHO intake.  Reinforced fasting blood sugar goals of 80-130 and less than 180 1 1/2-2 hours after meals.  Lab Results  Component Value Date   HGBA1C 8.6 (H) 10/24/2021   Hyperlipidemia Interventions:  (Status:  Condition stable.  Not addressed this visit.) Long Term Goal Medication review performed; medication list updated in electronic medical record.  Counseled on importance of regular laboratory monitoring as prescribed Reviewed role and benefits of statin for ASCVD risk reduction Reviewed importance of limiting foods high in cholesterol Reviewed exercise goals and target of 150 minutes per week  Hypertension Interventions:  (Status:  Goal on track:  Yes.) Long Term Goal Last practice recorded BP readings:  BP Readings from Last 3 Encounters:  12/22/21 110/70  12/20/21 (!) 143/68  10/24/21 112/64  Most recent eGFR/CrCl: No results found for: EGFR  No components found for: CRCL  Evaluation of current treatment plan related to hypertension self management and patient's adherence to plan as established by  provider Provided education to patient re: stroke prevention, s/s of heart attack and stroke Reviewed medications with patient and discussed importance of compliance Discussed plans with patient for ongoing care management follow up and provided patient with direct contact information for care management team Advised patient, providing education and rationale, to monitor blood pressure daily and record, calling PCP for findings outside established parameters Discussed complications of poorly controlled blood pressure such as heart disease, stroke, circulatory complications, vision complications, kidney impairment, sexual dysfunction Reinforced to follow a low sodium low CHO diet   Pain Interventions:  (Status:  Goal on track:  Yes.) Long Term Goal Pain assessment performed Medications reviewed Reviewed provider established plan for pain management Discussed importance of adherence to all scheduled medical appointments Counseled on the importance of reporting any/all new or changed pain symptoms or management strategies to pain management provider Advised patient to report to care team affect of pain on daily activities Discussed use of relaxation techniques and/or diversional activities to assist with pain reduction (distraction, imagery, relaxation, massage, acupressure, TENS, heat, and cold application Reviewed with patient prescribed pharmacological and nonpharmacological pain relief strategies Reviewed to continue to walk to help with back pain   Patient Goals/Self-Care Activities: Take all medications as prescribed Attend all scheduled provider appointments Call pharmacy for medication refills 3-7 days in advance of running out of medications Call provider office for new concerns or questions  call office if I gain more than 2 pounds in one day or 5 pounds in one week keep legs up while sitting use salt in moderation watch for swelling in feet, ankles and legs every day weigh myself  daily follow rescue plan if symptoms flare-up eat more whole grains, fruits and vegetables, lean meats and healthy fats keep appointment with eye doctor check blood sugar at prescribed times: twice daily and when you have symptoms of low or high blood sugar check feet daily for cuts, sores or redness drink 6 to 8 glasses of water each day fill half of plate with vegetables manage portion size switch to low-fat or skim milk switch to sugar-free drinks keep feet up while sitting wear comfortable, cotton socks check blood pressure daily choose a place to take my blood pressure (home, clinic or office, retail store) take blood pressure log to all doctor appointments take medications for blood pressure exactly as prescribed eat more whole grains, fruits and vegetables, lean meats and healthy fats limit salt intake to 2340m/day call for medicine refill 2  or 3 days before it runs out take all medications exactly as prescribed call doctor with any symptoms you believe are related to your medicine  Follow Up Plan:  Telephone follow up appointment with care management team member scheduled for:  01/31/22 The patient has been provided with contact information for the care management team and has been advised to call with any health related questions or concerns.       Plan:Telephone follow up appointment with care management team member scheduled for:  01/31/22 The patient has been provided with contact information for the care management team and has been advised to call with any health related questions or concerns.  Peter Garter RN, Jackquline Denmark, CDE Care Management Coordinator [facility] [phone]

## 2021-12-28 ENCOUNTER — Ambulatory Visit: Payer: Medicare Other | Admitting: Family Medicine

## 2021-12-28 ENCOUNTER — Ambulatory Visit (INDEPENDENT_AMBULATORY_CARE_PROVIDER_SITE_OTHER): Payer: Medicare Other | Admitting: Family

## 2021-12-28 ENCOUNTER — Encounter: Payer: Self-pay | Admitting: Family

## 2021-12-28 VITALS — BP 130/62 | HR 69 | Temp 98.0°F | Ht 59.0 in | Wt 163.1 lb

## 2021-12-28 DIAGNOSIS — E1165 Type 2 diabetes mellitus with hyperglycemia: Secondary | ICD-10-CM | POA: Diagnosis not present

## 2021-12-28 MED ORDER — GLIPIZIDE ER 10 MG PO TB24: 10.0000 mg | ORAL_TABLET | Freq: Two times a day (BID) | ORAL | 1 refills | Status: DC

## 2021-12-28 NOTE — Patient Instructions (Addendum)
It was very nice to see you today!   Increase your Glipizide to twice a day. This will max your dose to 20mg  total.  Continue checking fasting BS and before supper readings and let Dr. office know if either number is dropping too low: fasting <100 or supper <110.  Continue to drink 2 Liters of water daily as instructed.      PLEASE NOTE:  If you had any lab tests please let Rennis Golden know if you have not heard back within a few days. You may see your results on MyChart before we have a chance to review them but we will give you a call once they are reviewed by [place]. If we ordered any referrals today, please let us know if you have not heard from their office within the next week.

## 2021-12-29 ENCOUNTER — Other Ambulatory Visit: Payer: Self-pay

## 2021-12-29 ENCOUNTER — Encounter: Payer: Self-pay | Admitting: Family

## 2021-12-29 DIAGNOSIS — E1165 Type 2 diabetes mellitus with hyperglycemia: Secondary | ICD-10-CM

## 2021-12-29 MED ORDER — GLIPIZIDE ER 10 MG PO TB24: 10.0000 mg | ORAL_TABLET | Freq: Two times a day (BID) | ORAL | 1 refills | Status: DC

## 2021-12-30 ENCOUNTER — Other Ambulatory Visit: Payer: Self-pay | Admitting: Cardiology

## 2022-01-10 DIAGNOSIS — E1122 Type 2 diabetes mellitus with diabetic chronic kidney disease: Secondary | ICD-10-CM | POA: Diagnosis not present

## 2022-01-10 DIAGNOSIS — E785 Hyperlipidemia, unspecified: Secondary | ICD-10-CM | POA: Diagnosis not present

## 2022-01-10 DIAGNOSIS — N1831 Chronic kidney disease, stage 3a: Secondary | ICD-10-CM | POA: Diagnosis not present

## 2022-01-10 DIAGNOSIS — I5032 Chronic diastolic (congestive) heart failure: Secondary | ICD-10-CM | POA: Diagnosis not present

## 2022-01-10 DIAGNOSIS — G4733 Obstructive sleep apnea (adult) (pediatric): Secondary | ICD-10-CM | POA: Diagnosis not present

## 2022-01-10 DIAGNOSIS — I129 Hypertensive chronic kidney disease with stage 1 through stage 4 chronic kidney disease, or unspecified chronic kidney disease: Secondary | ICD-10-CM | POA: Diagnosis not present

## 2022-01-22 DIAGNOSIS — I10 Essential (primary) hypertension: Secondary | ICD-10-CM | POA: Diagnosis not present

## 2022-01-22 DIAGNOSIS — I5032 Chronic diastolic (congestive) heart failure: Secondary | ICD-10-CM

## 2022-01-22 DIAGNOSIS — N1832 Chronic kidney disease, stage 3b: Secondary | ICD-10-CM

## 2022-01-22 DIAGNOSIS — E1122 Type 2 diabetes mellitus with diabetic chronic kidney disease: Secondary | ICD-10-CM

## 2022-01-25 ENCOUNTER — Encounter: Payer: Self-pay | Admitting: Family Medicine

## 2022-01-25 ENCOUNTER — Ambulatory Visit (INDEPENDENT_AMBULATORY_CARE_PROVIDER_SITE_OTHER): Payer: Medicare Other | Admitting: Family Medicine

## 2022-01-25 ENCOUNTER — Telehealth: Payer: Self-pay

## 2022-01-25 VITALS — BP 118/68 | HR 65 | Temp 98.0°F | Resp 16 | Ht 59.0 in | Wt 163.4 lb

## 2022-01-25 DIAGNOSIS — N1832 Chronic kidney disease, stage 3b: Secondary | ICD-10-CM

## 2022-01-25 DIAGNOSIS — E1122 Type 2 diabetes mellitus with diabetic chronic kidney disease: Secondary | ICD-10-CM

## 2022-01-25 LAB — BASIC METABOLIC PANEL
BUN: 34 mg/dL — ABNORMAL HIGH (ref 6–23)
CO2: 28 mEq/L (ref 19–32)
Calcium: 10.3 mg/dL (ref 8.4–10.5)
Chloride: 101 mEq/L (ref 96–112)
Creatinine, Ser: 1.52 mg/dL — ABNORMAL HIGH (ref 0.40–1.20)
GFR: 34.7 mL/min — ABNORMAL LOW (ref >60.00)
Glucose, Bld: 187 mg/dL — ABNORMAL HIGH (ref 70–99)
Potassium: 3.9 mEq/L (ref 3.5–5.1)
Sodium: 137 mEq/L (ref 135–145)

## 2022-01-25 LAB — HEMOGLOBIN A1C: Hgb A1c MFr Bld: 9.8 % — ABNORMAL HIGH (ref 4.6–6.5)

## 2022-01-25 NOTE — Patient Instructions (Signed)
Follow up in 3-4 months to recheck sugars We'll notify you of your lab results and make any changes if needed If needed, we'll add Lantus to lower your sugars Keep up the good work on low carb diet- you can do it! Call with any questions or concerns Stay Safe!  Stay Healthy! Enjoy the rest of your summer!!!

## 2022-01-25 NOTE — Progress Notes (Signed)
   Subjective:    Patient ID: Kayla Brown, female    DOB: 12-31-1950, 71 y.o.   MRN: [id number]  HPI DM- ongoing issue for pt.  Pt is currently on Farxiga 10mg  daily, Glipizide 10mg  BID, Ozempic 1mg  daily.  Sugars have been running high.  Pt had ER visit on 6/28 due to elevated sugars- 531.  Had steroid shot in back which caused sugars to jump up.  Pt denies changes to diet.  Nephrology mentioned starting insulin.  Last A1C 8.6%.  no CP, SOB, abd pain, N/V, numbness/tingling of hands/feet.    UTD on foot exam, microalbumin, eye exam (attempting to get records)   Review of Systems For ROS see HPI     Objective:   Physical Exam Vitals reviewed.  Constitutional:      General: She is not in acute distress.    Appearance: Normal appearance. She is well-developed. She is not ill-appearing.  HENT:     Head: Normocephalic and atraumatic.  Eyes:     Conjunctiva/sclera: Conjunctivae normal.     Pupils: Pupils are equal, round, and reactive to light.  Neck:     Thyroid: No thyromegaly.  Cardiovascular:     Rate and Rhythm: Normal rate and regular rhythm.     Pulses: Normal pulses.     Heart sounds: Normal heart sounds. No murmur heard. Pulmonary:     Effort: Pulmonary effort is normal. No respiratory distress.     Breath sounds: Normal breath sounds.  Abdominal:     General: There is no distension.     Palpations: Abdomen is soft.     Tenderness: There is no abdominal tenderness.  Musculoskeletal:     Cervical back: Normal range of motion and neck supple.     Right lower leg: No edema.     Left lower leg: No edema.  Lymphadenopathy:     Cervical: No cervical adenopathy.  Skin:    General: Skin is warm and dry.  Neurological:     Mental Status: She is alert and oriented to person, place, and time. Mental status is at baseline.  Psychiatric:        Mood and Affect: Mood normal.        Behavior: Behavior normal.           Assessment & Plan:

## 2022-01-25 NOTE — Assessment & Plan Note (Signed)
Deteriorated.  Ongoing issue for pt.  Currently on Farxiga 10mg  daily, Glipizide 10mg  BID, and Ozempic 1mg  daily.  Metformin was stopped due to renal fxn and CHF.  UTD on eye exam, foot exam, microalbumin.  Sugars have been running high but she had steroid injxns in her back.  Based on the readings she brought into the office today, the #s have been trending down and are now in the 140s-180s where they were previously in the upper 200s-400.  Currently asymptomatic.  If A1C is considerably higher, will add low dose Lantus.  This was discussed w/ pt and husband.

## 2022-01-25 NOTE — Telephone Encounter (Signed)
Pt's Ozempic arrived at our office today . 5 boxes and Erica handed to pt as she was her to see Dr Beverely Low for  a visit

## 2022-01-26 ENCOUNTER — Telehealth: Payer: Self-pay

## 2022-01-26 MED ORDER — LANTUS SOLOSTAR 100 UNIT/ML ~~LOC~~ SOPN: 10.0000 [IU] | PEN_INJECTOR | Freq: Every day | SUBCUTANEOUS | 1 refills | Status: DC

## 2022-01-26 MED ORDER — PEN NEEDLES 32G X 4 MM MISC: 1.0000 | Freq: Every day | 2 refills | Status: DC

## 2022-01-26 NOTE — Telephone Encounter (Signed)
Thank you for sending these in

## 2022-01-26 NOTE — Telephone Encounter (Signed)
Spoke w/ pt and advised of lab results  

## 2022-01-26 NOTE — Telephone Encounter (Signed)
-----   Message from Sheliah Hatch, MD sent at 01/26/2022  7:43 AM EDT ----- Your A1C has jumped up to 9.8% which is not surprising given how high your sugars have been running.  Based on this, we are going to start 10 units of Lantus nightly.  (Prescription sent to pharmacy)  Kidney function has improved- great news!

## 2022-01-26 NOTE — Addendum Note (Signed)
Addended by: Sheliah Hatch on: 01/26/2022 07:43 AM   Modules accepted: Orders

## 2022-01-26 NOTE — Telephone Encounter (Signed)
Pt needs needles for lantus solostar pen

## 2022-01-29 ENCOUNTER — Encounter: Payer: Self-pay | Admitting: Family Medicine

## 2022-01-30 ENCOUNTER — Telehealth: Payer: Self-pay | Admitting: Pharmacist

## 2022-01-30 NOTE — Progress Notes (Signed)
Error

## 2022-01-31 ENCOUNTER — Ambulatory Visit: Payer: Medicare Other

## 2022-01-31 DIAGNOSIS — I5032 Chronic diastolic (congestive) heart failure: Secondary | ICD-10-CM

## 2022-01-31 DIAGNOSIS — N1832 Chronic kidney disease, stage 3b: Secondary | ICD-10-CM

## 2022-01-31 DIAGNOSIS — E785 Hyperlipidemia, unspecified: Secondary | ICD-10-CM

## 2022-01-31 DIAGNOSIS — E1122 Type 2 diabetes mellitus with diabetic chronic kidney disease: Secondary | ICD-10-CM

## 2022-01-31 DIAGNOSIS — I10 Essential (primary) hypertension: Secondary | ICD-10-CM

## 2022-01-31 NOTE — Patient Instructions (Signed)
Visit Information RNCM Case closed goals met Thank you for allowing me to share the care management and care coordination services that are available to you as part of your health plan and services through your primary care provider and medical home. Please reach out to me at [phone] if the care management/care coordination team may be of assistance to you in the future.   Peter Garter RN, Jackquline Denmark, CDE Care Management Coordinator [facility] [phone]

## 2022-01-31 NOTE — Chronic Care Management (AMB) (Signed)
Care Management    RN Visit Note  01/31/2022 Name: Kayla Brown MRN: [id number] DOB: 12-22-1950  Subjective: Kayla Brown is a 71 y.o. year old female who is a primary care patient of Kayla Brown, Kayla Millet, MD. The care management team was consulted for assistance with disease management and care coordination needs.    Engaged with patient by telephone for follow up visit in response to provider referral for case management and/or care coordination services.   Consent to Services:   [name] was given information about Care Management services today including:  Care Management services includes personalized support from designated clinical staff supervised by her physician, including individualized plan of care and coordination with other care providers 24/7 contact phone numbers for assistance for urgent and routine care needs. The patient may stop case management services at any time by phone call to the office staff.  Patient agreed to services and consent obtained.   Assessment: Review of patient past medical history, allergies, medications, health status, including review of consultants reports, laboratory and other test data, was performed as part of comprehensive evaluation and provision of chronic care management services.   SDOH (Social Determinants of Health) assessments and interventions performed:    Care Plan  No Known Allergies  Outpatient Encounter Medications as of 01/31/2022  Medication Sig Note   albuterol (VENTOLIN HFA) 108 (90 Base) MCG/ACT inhaler Inhale 1-2 puffs into the lungs every 6 (six) hours as needed for wheezing or shortness of breath.    Alcohol Swabs (ALCOHOL PREP) PADS Pt uses an alcohol pad each time sugars are tested. Pt tests twice daily. Dx E11.9    amLODipine (NORVASC) 10 MG tablet TAKE 1 TABLET BY MOUTH EVERY DAY    atorvastatin (LIPITOR) 40 MG tablet TAKE 1 TABLET BY MOUTH EVERY DAY    B Complex Vitamins (B COMPLEX PO) Take 1 tablet by  mouth daily.    buPROPion (WELLBUTRIN XL) 300 MG 24 hr tablet Take 1 tablet (300 mg total) by mouth daily.    Calcium Carb-Cholecalciferol [id number] MG-UNIT CAPS Take 1 tablet by mouth 2 (two) times daily.    carvedilol (COREG) 25 MG tablet TAKE 1 TABLET BY MOUTH TWICE A DAY    dapagliflozin propanediol (FARXIGA) 10 MG TABS tablet Take 10 mg by mouth daily. 01/25/2021: Gets through patient assistance.    fish oil-omega-3 fatty acids 1000 MG capsule Take 1 g by mouth daily.    fluticasone (FLONASE) 50 MCG/ACT nasal spray SPRAY 2 SPRAYS INTO EACH NOSTRIL EVERY DAY (Patient taking differently: Place 2 sprays into both nostrils daily.)    furosemide (LASIX) 20 MG tablet TAKE 1 TABLET BY MOUTH EVERY DAY    glipiZIDE (GLUCOTROL XL) 10 MG 24 hr tablet Take 1 tablet (10 mg total) by mouth 2 (two) times daily with a meal.    glucose blood (FREESTYLE LITE) test strip USE TO TEST BLOOD GLUCOSE 2 TIMES DAILY. Dx E11.9    insulin glargine (LANTUS SOLOSTAR) 100 UNIT/ML Solostar Pen Inject 10 Units into the skin daily.    Insulin Pen Needle (PEN NEEDLES) 32G X 4 MM MISC 1 Application by Does not apply route daily.    loratadine (CLARITIN) 10 MG tablet TAKE 1 TABLET BY MOUTH EVERY DAY (Patient taking differently: Take 10 mg by mouth daily.)    Melatonin 3 MG TABS Take 10 mg by mouth at bedtime.     methocarbamol (ROBAXIN) 500 MG tablet Take by mouth.    Multiple Vitamins-Minerals (VISION  FORMULA) TABS Take 1 tablet by mouth daily.    OVER THE COUNTER MEDICATION Take 1 tablet by mouth daily. Equate Vision Formula 50+    Semaglutide, 1 MG/DOSE, 4 MG/3ML SOPN Inject 1 mg as directed once a week.    sertraline (ZOLOFT) 50 MG tablet TAKE 1 TABLET BY MOUTH EVERY DAY    traMADol (ULTRAM) 50 MG tablet Take 1 tablet by mouth every 6 (six) hours as needed.    TRUEPLUS LANCETS 30G MISC Use one lancet each time sugars are tested. Pt tests twice daily. Dx. E11.9    UNABLE TO FIND Med Name: [employer] talking meter    No  facility-administered encounter medications on file as of 01/31/2022.    Patient Active Problem List   Diagnosis Date Noted   Reactive airway disease 01/30/2021   Morbid obesity ([place]) 11/18/2020   Osteopenia 11/18/2020   Type 2 diabetes mellitus with stage 3a chronic kidney disease, without long-term current use of insulin ([place]) 07/21/2020   Type 2 diabetes mellitus with hyperglycemia, with long-term current use of insulin ([place]) 04/19/2020   Lumbar radiculopathy 03/02/2020   Left hip pain 12/01/2019   Type 2 diabetes mellitus with stage 3b chronic kidney disease, without long-term current use of insulin ([place]) 28/31/5176   Diastolic CHF ([place]) 16/12/3708   Dyspnea 03/08/2019   Pulmonary edema 03/04/2019   Physical exam 02/06/2017   Anxiety and depression 12/05/2016   Obesity (BMI 30-39.9) 03/01/2016   OSA (obstructive sleep apnea) 07/11/2015   Pulmonary hypertension ([place]) 05/06/2015   Heart murmur 03/03/2015   Essential hypertension, benign 03/18/2014   Hyperlipidemia 03/18/2014   Post-menopause on HRT (hormone replacement therapy) 03/18/2014    Conditions to be addressed/monitored: CHF, HTN, HLD, DMII, and CKD Stage 3  Care Plan : RN Care Manager Plan of Care  Updates made by Kayla Ped, RN since 01/31/2022 12:00 AM  Completed 01/31/2022   Problem: Chronic Disease Management and Care Coordination Needs (DM, CHF, CKD,HTN and HLD) Resolved 01/31/2022  Priority: High     Long-Range Goal: Establish Plan of Care for Chronic Disease Management Needs (DM, CHF, CKD,HTN and HLD) Completed 01/31/2022  Start Date: 06/14/2021  Expected End Date: 06/11/2022  Priority: High  Note:   Case closed goals met Current Barriers:  Knowledge Deficits related to plan of care for management of CHF, HTN, HLD, DMII, and CKD Stage 3  Care Coordination needs related to Financial constraints related to cost of medications Chronic Disease Management support and education needs related to CHF, HTN, HLD,  DMII, and CKD Stage 3  States her blood sugars are better since she started insulin.  States she has no issues in giving her insulin. States her fasting readings have ranged 126-135 this week and her evening reading are around 200 but she thinks she ate too much snack.  States she is trying to drink more water. States she does not drink anything with sugar and her diet drinks are clear.  States her weight was164.2 this morning and she has not needed to take any extra Lasix. Denies any swelling, chest pains or shortness of breath.  States her B/P has been stable.  States her back pain has been better since her back injection. States she is walking 3-4 times a week for 10- 15 minutes.  RNCM Clinical Goal(s):  Patient will verbalize understanding of plan for management of CHF, HTN, HLD, DMII, and CKD Stage 3 as evidenced by voiced adherence to plan of care verbalize basic understanding of  CHF, HTN, HLD, DMII, and CKD Stage 3 disease process and self health management plan as evidenced by voiced understanding and teach back take all medications exactly as prescribed and will call provider for medication related questions as evidenced by dispense report and pt verbalization attend all scheduled medical appointments: Annual Wellness Visit 02/01/22, Nephrology October, cardiology 03/30/22, Pulmonary 07/05/22, Dr. Birdie Brown 04/30/22 as evidenced by medical records demonstrate Improved adherence to prescribed treatment plan for CHF, HTN, HLD, DMII, and CKD Stage 3 as evidenced by readings within limits, voiced adherence to plan of care continue to work with RN Care Manager to address care management and care coordination needs related to  CHF, HTN, HLD, DMII, and CKD Stage 3 as evidenced by adherence to CM Team Scheduled appointments work with pharmacist to address Financial constraints related to cost of medications related toCHF, HTN, HLD, DMII, and CKD Stage 3 as evidenced by review or EMR and patient or pharmacist  report through collaboration with RN Care manager, provider, and care team.   Interventions: 1:1 collaboration with primary care provider regarding development and update of comprehensive plan of care as evidenced by provider attestation and co-signature Inter-disciplinary care team collaboration (see longitudinal plan of care) Evaluation of current treatment plan related to  self management and patient's adherence to plan as established by provider Reviewed importance and to keep Annual Wellness visit scheduled 02/01/22   Heart Failure Interventions:  (Status:  Goal on track:  Yes. and Goal Met.) Long Term Goal Provided education on low sodium diet Reviewed Heart Failure Action Plan in depth and provided written copy Discussed importance of daily weight and advised patient to weigh and record daily Reviewed role of diuretics in prevention of fluid overload and management of heart failure; Discussed the importance of keeping all appointments with provider Advised patient to discuss need for fluid restriction with provider Reinforced s/sx to call provider. Reinforced to take Lasix as needed for weight gain   Chronic Kidney Disease Interventions:  (Status:  Goal Met.) Long Term Goal Evaluation of current treatment plan related to chronic kidney disease self management and patient's adherence to plan as established by provider      Reviewed prescribed diet low sodium low CHO Reviewed medications with patient and discussed importance of compliance    Counseled on the importance of exercise goals with target of 150 minutes per week     Discussed complications of poorly controlled blood pressure such as heart disease, stroke, circulatory complications, vision complications, kidney impairment, sexual dysfunction    Discussed plans with patient for ongoing care management follow up and provided patient with direct contact information for care management team    Provided education on kidney disease  progression    Reinforced importance of keep blood sugars under control to help with kidney function Last practice recorded BP readings:  BP Readings from Last 3 Encounters:  01/25/22 118/68  12/28/21 130/62  12/22/21 110/70  Most recent eGFR/CrCl: No results found for: EGFR  No components found for: CRCL    Diabetes Interventions:  (Status:  Gaol Not Met.) Long Term Goal Assessed patient's understanding of A1c goal: <7% Provided education to patient about basic DM disease process Reviewed medications with patient and discussed importance of medication adherence Counseled on importance of regular laboratory monitoring as prescribed Discussed plans with patient for ongoing care management follow up and provided patient with direct contact information for care management team Advised patient, providing education and rationale, to check cbg twice daily and record, calling  provider for findings outside established parameters Review of patient status, including review of consultants reports, relevant laboratory and other test results, and medications completed Reviewed use of insulin and how to take.  Reinforced how exercise can help lower blood sugars. Reinforced  to eat healthy protein snacks and to limit her CHO intake.  Reinforced fasting blood sugar goals of 80-130 and less than 180 1 1/2-2 hours after meals.  Lab Results  Component Value Date   HGBA1C 9.8 (H) 01/25/2022   Hyperlipidemia Interventions:  (Status:  Goal Met.) Long Term Goal Medication review performed; medication list updated in electronic medical record.  Counseled on importance of regular laboratory monitoring as prescribed Reviewed role and benefits of statin for ASCVD risk reduction Reviewed importance of limiting foods high in cholesterol Reviewed exercise goals and target of 150 minutes per week  Hypertension Interventions:  (Status:  Goal Met.) Long Term Goal Last practice recorded BP readings:  BP Readings from  Last 3 Encounters:  01/25/22 118/68  12/28/21 130/62  12/22/21 110/70  Most recent eGFR/CrCl: No results found for: EGFR  No components found for: CRCL  Evaluation of current treatment plan related to hypertension self management and patient's adherence to plan as established by provider Provided education to patient re: stroke prevention, s/s of heart attack and stroke Reviewed medications with patient and discussed importance of compliance Discussed plans with patient for ongoing care management follow up and provided patient with direct contact information for care management team Advised patient, providing education and rationale, to monitor blood pressure daily and record, calling PCP for findings outside established parameters Discussed complications of poorly controlled blood pressure such as heart disease, stroke, circulatory complications, vision complications, kidney impairment, sexual dysfunction Reinforced to follow a low sodium low CHO diet   Pain Interventions:  (Status:  Goal Met.) Long Term Goal Pain assessment performed Medications reviewed Reviewed provider established plan for pain management Discussed importance of adherence to all scheduled medical appointments Counseled on the importance of reporting any/all new or changed pain symptoms or management strategies to pain management provider Advised patient to report to care team affect of pain on daily activities Discussed use of relaxation techniques and/or diversional activities to assist with pain reduction (distraction, imagery, relaxation, massage, acupressure, TENS, heat, and cold application Reviewed with patient prescribed pharmacological and nonpharmacological pain relief strategies Reviewed to continue to walk to help with back pain   Patient Goals/Self-Care Activities: Take all medications as prescribed Attend all scheduled provider appointments Call pharmacy for medication refills 3-7 days in advance of  running out of medications Call provider office for new concerns or questions  call office if I gain more than 2 pounds in one day or 5 pounds in one week keep legs up while sitting use salt in moderation watch for swelling in feet, ankles and legs every day weigh myself daily follow rescue plan if symptoms flare-up eat more whole grains, fruits and vegetables, lean meats and healthy fats keep appointment with eye doctor check blood sugar at prescribed times: twice daily and when you have symptoms of low or high blood sugar check feet daily for cuts, sores or redness drink 6 to 8 glasses of water each day fill half of plate with vegetables manage portion size switch to low-fat or skim milk switch to sugar-free drinks keep feet up while sitting wear comfortable, cotton socks check blood pressure daily choose a place to take my blood pressure (home, clinic or office, retail store) take blood pressure log  to all doctor appointments take medications for blood pressure exactly as prescribed eat more whole grains, fruits and vegetables, lean meats and healthy fats limit salt intake to 2394m/day call for medicine refill 2 or 3 days before it runs out take all medications exactly as prescribed call doctor with any symptoms you believe are related to your medicine  Follow Up Plan:  The patient has been provided with contact information for the care management team and has been advised to call with any health related questions or concerns.  No further follow up required: Case closed goals met       Plan: The patient has been provided with contact information for the care management team and has been advised to call with any health related questions or concerns.  No further follow up required: RNCM Case closed goals met  MPeter GarterRN, BNew [facility] Center CDE Care Management Coordinator [facility] ([phone]

## 2022-02-01 ENCOUNTER — Ambulatory Visit (INDEPENDENT_AMBULATORY_CARE_PROVIDER_SITE_OTHER): Payer: Medicare Other

## 2022-02-01 ENCOUNTER — Other Ambulatory Visit: Payer: Self-pay | Admitting: Family Medicine

## 2022-02-01 DIAGNOSIS — Z0000 Encounter for general adult medical examination without abnormal findings: Secondary | ICD-10-CM

## 2022-02-01 DIAGNOSIS — Z1231 Encounter for screening mammogram for malignant neoplasm of breast: Secondary | ICD-10-CM

## 2022-02-01 NOTE — Progress Notes (Signed)
Subjective:   Kayla Brown is a 71 y.o. female who presents for Medicare Annual (Subsequent) preventive examination.   I connected with Kayla Brown  today by telephone and verified that I am speaking with the correct person using two identifiers. Location patient: home Location provider: work Persons participating in the virtual visit: patient, provider.   I discussed the limitations, risks, security and privacy concerns of performing an evaluation and management service by telephone and the availability of in person appointments. I also discussed with the patient that there may be a patient responsible charge related to this service. The patient expressed understanding and verbally consented to this telephonic visit.    Interactive audio and video telecommunications were attempted between this provider and patient, however failed, due to patient having technical difficulties OR patient did not have access to video capability.  We continued and completed visit with audio only.    Review of Systems     Cardiac Risk Factors include: advanced age (>7men, >57 women);diabetes mellitus     Objective:    Today's Vitals   There is no height or weight on file to calculate BMI.     02/01/2022    8:23 AM 12/20/2021   12:11 PM 12/12/2020    9:49 AM 10/26/2020   10:45 AM 08/10/2020   10:33 AM 04/01/2020    2:13 PM 01/19/2020   12:16 PM  Advanced Directives  Does Patient Have a Medical Advance Directive? No No  No No No No  Would patient like information on creating a medical advance directive? No - Patient declined Yes (ED - Information included in AVS) No - Patient declined No - Patient declined No - Patient declined Yes (ED - Information included in AVS) Yes (ED - Information included in AVS)    Current Medications (verified) Outpatient Encounter Medications as of 02/01/2022  Medication Sig   albuterol (VENTOLIN HFA) 108 (90 Base) MCG/ACT inhaler Inhale 1-2 puffs into the lungs every 6  (six) hours as needed for wheezing or shortness of breath.   Alcohol Swabs (ALCOHOL PREP) PADS Pt uses an alcohol pad each time sugars are tested. Pt tests twice daily. Dx E11.9   amLODipine (NORVASC) 10 MG tablet TAKE 1 TABLET BY MOUTH EVERY DAY   atorvastatin (LIPITOR) 40 MG tablet TAKE 1 TABLET BY MOUTH EVERY DAY   B Complex Vitamins (B COMPLEX PO) Take 1 tablet by mouth daily.   buPROPion (WELLBUTRIN XL) 300 MG 24 hr tablet Take 1 tablet (300 mg total) by mouth daily.   Calcium Carb-Cholecalciferol 3088338188 MG-UNIT CAPS Take 1 tablet by mouth 2 (two) times daily.   carvedilol (COREG) 25 MG tablet TAKE 1 TABLET BY MOUTH TWICE A DAY   dapagliflozin propanediol (FARXIGA) 10 MG TABS tablet Take 10 mg by mouth daily.   fish oil-omega-3 fatty acids 1000 MG capsule Take 1 g by mouth daily.   fluticasone (FLONASE) 50 MCG/ACT nasal spray SPRAY 2 SPRAYS INTO EACH NOSTRIL EVERY DAY (Patient taking differently: Place 2 sprays into both nostrils daily.)   furosemide (LASIX) 20 MG tablet TAKE 1 TABLET BY MOUTH EVERY DAY   glipiZIDE (GLUCOTROL XL) 10 MG 24 hr tablet Take 1 tablet (10 mg total) by mouth 2 (two) times daily with a meal.   glucose blood (FREESTYLE LITE) test strip USE TO TEST BLOOD GLUCOSE 2 TIMES DAILY. Dx E11.9   insulin glargine (LANTUS SOLOSTAR) 100 UNIT/ML Solostar Pen Inject 10 Units into the skin daily.   Insulin Pen Needle (  PEN NEEDLES) 32G X 4 MM MISC 1 Application by Does not apply route daily.   loratadine (CLARITIN) 10 MG tablet TAKE 1 TABLET BY MOUTH EVERY DAY (Patient taking differently: Take 10 mg by mouth daily.)   Melatonin 3 MG TABS Take 10 mg by mouth at bedtime.    methocarbamol (ROBAXIN) 500 MG tablet Take by mouth.   Multiple Vitamins-Minerals (VISION FORMULA) TABS Take 1 tablet by mouth daily.   OVER THE COUNTER MEDICATION Take 1 tablet by mouth daily. Equate Vision Formula 50+   Semaglutide, 1 MG/DOSE, 4 MG/3ML SOPN Inject 1 mg as directed once a week.   sertraline  (ZOLOFT) 50 MG tablet TAKE 1 TABLET BY MOUTH EVERY DAY   traMADol (ULTRAM) 50 MG tablet Take 1 tablet by mouth every 6 (six) hours as needed.   TRUEPLUS LANCETS 30G MISC Use one lancet each time sugars are tested. Pt tests twice daily. Dx. E11.9   UNABLE TO FIND Med Name: Healthwise talking meter   No facility-administered encounter medications on file as of 02/01/2022.    Allergies (verified) Patient has no known allergies.   History: Past Medical History:  Diagnosis Date   Anxiety    Congestive heart failure (CHF) (HCC)    Diabetes mellitus    Edema of extremities 03/02/2016   Essential hypertension, benign    Lazy eye    Major depressive disorder, single episode, unspecified    Obesity (BMI 30-39.9) 03/01/2016   OSA (obstructive sleep apnea)    Pulmonary HTN (HCC)    mild with PASP by echo 05/2016   Pure hypercholesterolemia    Retinopathy, due to hypertension    Past Surgical History:  Procedure Laterality Date   CPAP TITRATION  10/14/2015   DILATION AND CURETTAGE OF UTERUS  2005-2008   x2   EYE SURGERY     cataracts   WISDOM TOOTH EXTRACTION     Family History  Adopted: Yes  Problem Relation Age of Onset   Alcohol abuse Mother    ADD / ADHD Mother    Dementia Mother    Breast cancer Neg Hx    Social History   Socioeconomic History   Marital status: Married    Spouse name: Cherre Huger   Number of children: Not on file   Years of education: Not on file   Highest education level: 12th grade  Occupational History   Occupation: retired  Tobacco Use   Smoking status: Never   Smokeless tobacco: Never  Vaping Use   Vaping Use: Never used  Substance and Sexual Activity   Alcohol use: No    Alcohol/week: 0.0 standard drinks of alcohol   Drug use: No   Sexual activity: Yes    Partners: Male    Birth control/protection: None  Other Topics Concern   Not on file  Social History Narrative   Not on file   Social Determinants of Health   Financial Resource  Strain: Low Risk  (02/01/2022)   Overall Financial Resource Strain (CARDIA)    Difficulty of Paying Living Expenses: Not hard at all  Food Insecurity: No Food Insecurity (02/01/2022)   Hunger Vital Sign    Worried About Running Out of Food in the Last Year: Never true    Ran Out of Food in the Last Year: Never true  Transportation Needs: No Transportation Needs (02/01/2022)   PRAPARE - Administrator, Civil Service (Medical): No    Lack of Transportation (Non-Medical): No  Physical Activity: Insufficiently  Active (02/01/2022)   Exercise Vital Sign    Days of Exercise per Week: 2 days    Minutes of Exercise per Session: 20 min  Stress: No Stress Concern Present (02/01/2022)   [employer] of Occupational Health - Occupational Stress Questionnaire    Feeling of Stress : Not at all  Social Connections: Moderately Integrated (02/01/2022)   Social Connection and Isolation Panel [NHANES]    Frequency of Communication with Friends and Family: Twice a week    Frequency of Social Gatherings with Friends and Family: Twice a week    Attends Religious Services: More than 4 times per year    Active Member of [employer] or Organizations: No    Attends Engineer, structural: Never    Marital Status: Married    Tobacco Counseling Counseling given: Not Answered   Clinical Intake:  Pre-visit preparation completed: Yes  Pain : No/denies pain     Nutritional Risks: None Diabetes: Yes CBG done?: No Did pt. bring in CBG monitor from home?: No  How often do you need to have someone help you when you read instructions, pamphlets, or other written materials from your doctor or pharmacy?: 1 - Never What is the last grade level you completed in school?: High School  Diabetic?yes Nutrition Risk Assessment:  Has the patient had any N/V/D within the last 2 months?  No  Does the patient have any non-healing wounds?  No  Has the patient had any unintentional weight loss or weight  gain?  No   Diabetes:  Is the patient diabetic?  Yes  If diabetic, was a CBG obtained today?  No  Did the patient bring in their glucometer from home?  No  How often do you monitor your CBG's? 2x day .   Financial Strains and Diabetes Management:  Are you having any financial strains with the device, your supplies or your medication? No .  Does the patient want to be seen by Chronic Care Management for management of their diabetes?  No  Would the patient like to be referred to a Nutritionist or for Diabetic Management?  No   Diabetic Exams:  Diabetic Eye Exam: Completed 07/2021 Diabetic Foot Exam: Overdue, Pt has been advised about the importance in completing this exam. Pt is scheduled for diabetic foot exam on next office visit .   Interpreter Needed?: No  Information entered by :: L.Wilson,LPN   Activities of Daily Living    02/01/2022    8:24 AM 01/28/2022    8:44 AM  In your present state of health, do you have any difficulty performing the following activities:  Hearing? 0 1  Vision? 0 0  Difficulty concentrating or making decisions? 0 0  Walking or climbing stairs? 0 1  Dressing or bathing? 0 0  Doing errands, shopping? 0 0  Preparing Food and eating ? N N  Using the Toilet? N N  In the past six months, have you accidently leaked urine? N N  Do you have problems with loss of bowel control? N N  Managing your Medications? N N  Managing your Finances? N N  Housekeeping or managing your Housekeeping? N N    Patient Care Team: Sheliah Hatch, MD as PCP - General (Family Medicine) Carman Ching, MD (Inactive) as Consulting Physician (Gastroenterology) Jerene Bears, MD as Consulting Physician (Gynecology) Erroll Luna, [facility] (Pharmacist) [place], [name], MD as Consulting Physician (Cardiology)  Indicate any recent Medical Services you may have received from other  than [facility] providers in the past year (date may be approximate).     Assessment:   This  is a routine wellness examination for Caitrin.  Hearing/Vision screen Vision Screening - Comments:: Annual eye exams wear glasses   Dietary issues and exercise activities discussed: Current Exercise Habits: Home exercise routine, Type of exercise: walking, Time (Minutes): 20, Frequency (Times/Week): 2, Weekly Exercise (Minutes/Week): 40, Intensity: Mild, Exercise limited by: None identified   Goals Addressed   None    Depression Screen    02/01/2022    8:23 AM 02/01/2022    8:22 AM 01/25/2022    8:47 AM 10/24/2021    8:26 AM 09/18/2021    7:59 AM 06/08/2021   11:24 AM 04/26/2021    8:21 AM  PHQ 2/9 Scores  PHQ - 2 Score 0 0 2 2 0 2 2  PHQ- 9 Score   5 7 5 6 6     Fall Risk    02/01/2022    8:23 AM 01/28/2022    8:44 AM 01/25/2022    8:47 AM 10/24/2021    8:26 AM 09/18/2021    7:59 AM  Fall Risk   Falls in the past year? 0 0 0 0 0  Number falls in past yr: 0 0  0   Injury with Fall? 0 0 0 0   Risk for fall due to :   No Fall Risks No Fall Risks No Fall Risks  Follow up Falls evaluation completed;Education provided  Falls evaluation completed Falls evaluation completed Falls evaluation completed    [place]:  Any stairs in or around the home? No  If so, are there any without handrails? No  Home free of loose throw rugs in walkways, pet beds, electrical cords, etc? Yes  Adequate lighting in your home to reduce risk of falls? Yes   ASSISTIVE DEVICES UTILIZED TO PREVENT FALLS:  Life alert? No  Use of a cane, walker or w/c? Yes  Grab bars in the bathroom? Yes  Shower chair or bench in shower? Yes  Elevated toilet seat or a handicapped toilet? Yes     Cognitive Function:    Normal cognitive status assessed by telephone conversation  by this Nurse Health Advisor. No abnormalities found.      02/01/2022    8:27 AM  6CIT Screen  What Year? 0 points  What month? 0 points  What time? 0 points  Count back from 20 0 points  Months in reverse 0  points  Repeat phrase 0 points  Total Score 0 points    Immunizations Immunization History  Administered Date(s) Administered   Fluad Quad(high Dose 65+) 02/16/2019, 03/28/2020, 04/26/2021   Influenza,inj,Quad PF,6+ Mos 04/24/2016, 03/04/2017, 03/20/2018   PFIZER(Purple Top)SARS-COV-2 Vaccination 09/24/2019, 10/15/2019, 04/25/2020   Pneumococcal Conjugate-13 07/06/2016   Pneumococcal Polysaccharide-23 07/01/2017   Tdap 09/23/2009    TDAP status: Due, Education has been provided regarding the importance of this vaccine. Advised may receive this vaccine at local pharmacy or Health Dept. Aware to provide a copy of the vaccination record if obtained from local pharmacy or Health Dept. Verbalized acceptance and understanding.  Flu Vaccine status: Up to date  Pneumococcal vaccine status: Up to date  Covid-19 vaccine status: Completed vaccines  Qualifies for Shingles Vaccine? Yes   Zostavax completed No   Shingrix Completed?: No.    Education has been provided regarding the importance of this vaccine. Patient has been advised to call insurance company to determine out  of pocket expense if they have not yet received this vaccine. Advised may also receive vaccine at local pharmacy or Health Dept. Verbalized acceptance and understanding.  Screening Tests Health Maintenance  Topic Date Due   INFLUENZA VACCINE  01/23/2022   TETANUS/TDAP  10/25/2022 (Originally 09/24/2019)   MAMMOGRAM  02/06/2022   FOOT EXAM  05/12/2022   URINE MICROALBUMIN  05/22/2022   HEMOGLOBIN A1C  07/28/2022   OPHTHALMOLOGY EXAM  08/04/2022   COLONOSCOPY (Pts 45-49yrs Insurance coverage will need to be confirmed)  12/15/2026   Pneumonia Vaccine 62+ Years old  Completed   DEXA SCAN  Completed   Hepatitis C Screening  Completed   HPV VACCINES  Aged Out   COVID-19 Vaccine  Discontinued   Zoster Vaccines- Shingrix  Discontinued    Health Maintenance  Health Maintenance Due  Topic Date Due   INFLUENZA VACCINE   01/23/2022    Colorectal cancer screening: Type of screening: Colonoscopy. Completed 12/14/2016. Repeat every 10 years  Mammogram status: Completed 02/06/2021 patient will make appointment . Repeat every year  Bone Density status: Completed 05/29/2021. Results reflect: Bone density results: OSTEOPOROSIS. Repeat every 2 years.  Lung Cancer Screening: (Low Dose CT Chest recommended if Age 75-80 years, 30 pack-year currently smoking OR have quit w/in 15years.) does not qualify.   Lung Cancer Screening Referral: n/a  Additional Screening:  Hepatitis C Screening: does not qualify; Completed 08/28/2016  Vision Screening: Recommended annual ophthalmology exams for early detection of glaucoma and other disorders of the eye. Is the patient up to date with their annual eye exam?  Yes  Who is the provider or what is the name of the office in which the patient attends annual eye exams? Dr.Bowen  If pt is not established with a provider, would they like to be referred to a provider to establish care? No .   Dental Screening: Recommended annual dental exams for proper oral hygiene  Community Resource Referral / Chronic Care Management: CRR required this visit?  No   CCM required this visit?  No      Plan:     I have personally reviewed and noted the following in the patient's chart:   Medical and social history Use of alcohol, tobacco or illicit drugs  Current medications and supplements including opioid prescriptions.  Functional ability and status Nutritional status Physical activity Advanced directives List of other physicians Hospitalizations, surgeries, and ER visits in previous 12 months Vitals Screenings to include cognitive, depression, and falls Referrals and appointments  In addition, I have reviewed and discussed with patient certain preventive protocols, quality metrics, and best practice recommendations. A written personalized care plan for preventive services as well  as general preventive health recommendations were provided to patient.     Daphane Shepherd, LPN   [id number]   Nurse Notes: none

## 2022-02-01 NOTE — Patient Instructions (Signed)
Kayla Brown , Thank you for taking time to come for your Medicare Wellness Visit. I appreciate your ongoing commitment to your health goals. Please review the following plan we discussed and let me know if I can assist you in the future.   Screening recommendations/referrals: Colonoscopy: 12/14/2016 Mammogram: 02/06/2021  patient will make appointment  Bone Density: 05/29/2021 Recommended yearly ophthalmology/optometry visit for glaucoma screening and checkup Recommended yearly dental visit for hygiene and checkup  Vaccinations: Influenza vaccine: completed  Pneumococcal vaccine: completed  Tdap vaccine: due  Shingles vaccine: will consider     Advanced directives: none   Conditions/risks identified: none   Next appointment: none    Preventive Care 65 Years and Older, Female Preventive care refers to lifestyle choices and visits with your health care provider that can promote health and wellness. What does preventive care include? A yearly physical exam. This is also called an annual well check. Dental exams once or twice a year. Routine eye exams. Ask your health care provider how often you should have your eyes checked. Personal lifestyle choices, including: Daily care of your teeth and gums. Regular physical activity. Eating a healthy diet. Avoiding tobacco and drug use. Limiting alcohol use. Practicing safe sex. Taking low-dose aspirin every day. Taking vitamin and mineral supplements as recommended by your health care provider. What happens during an annual well check? The services and screenings done by your health care provider during your annual well check will depend on your age, overall health, lifestyle risk factors, and family history of disease. Counseling  Your health care provider may ask you questions about your: Alcohol use. Tobacco use. Drug use. Emotional well-being. Home and relationship well-being. Sexual activity. Eating habits. History of  falls. Memory and ability to understand (cognition). Work and work Astronomer. Reproductive health. Screening  You may have the following tests or measurements: Height, weight, and BMI. Blood pressure. Lipid and cholesterol levels. These may be checked every 5 years, or more frequently if you are over 9 years old. Skin check. Lung cancer screening. You may have this screening every year starting at age 35 if you have a 30-pack-year history of smoking and currently smoke or have quit within the past 15 years. Fecal occult blood test (FOBT) of the stool. You may have this test every year starting at age 75. Flexible sigmoidoscopy or colonoscopy. You may have a sigmoidoscopy every 5 years or a colonoscopy every 10 years starting at age 62. Hepatitis C blood test. Hepatitis B blood test. Sexually transmitted disease (STD) testing. Diabetes screening. This is done by checking your blood sugar (glucose) after you have not eaten for a while (fasting). You may have this done every 1-3 years. Bone density scan. This is done to screen for osteoporosis. You may have this done starting at age 43. Mammogram. This may be done every 1-2 years. Talk to your health care provider about how often you should have regular mammograms. Talk with your health care provider about your test results, treatment options, and if necessary, the need for more tests. Vaccines  Your health care provider may recommend certain vaccines, such as: Influenza vaccine. This is recommended every year. Tetanus, diphtheria, and acellular pertussis (Tdap, Td) vaccine. You may need a Td booster every 10 years. Zoster vaccine. You may need this after age 39. Pneumococcal 13-valent conjugate (PCV13) vaccine. One dose is recommended after age 54. Pneumococcal polysaccharide (PPSV23) vaccine. One dose is recommended after age 11. Talk to your health care provider about which screenings  and vaccines you need and how often you need  them. This information is not intended to replace advice given to you by your health care provider. Make sure you discuss any questions you have with your health care provider. Document Released: 07/08/2015 Document Revised: 02/29/2016 Document Reviewed: 04/12/2015 Elsevier Interactive Patient Education  2017 [place] Prevention in the Home Falls can cause injuries. They can happen to people of all ages. There are many things you can do to make your home safe and to help prevent falls. What can I do on the outside of my home? Regularly fix the edges of walkways and driveways and fix any cracks. Remove anything that might make you trip as you walk through a door, such as a raised step or threshold. Trim any bushes or trees on the path to your home. Use bright outdoor lighting. Clear any walking paths of anything that might make someone trip, such as rocks or tools. Regularly check to see if handrails are loose or broken. Make sure that both sides of any steps have handrails. Any raised decks and porches should have guardrails on the edges. Have any leaves, snow, or ice cleared regularly. Use sand or salt on walking paths during winter. Clean up any spills in your garage right away. This includes oil or grease spills. What can I do in the bathroom? Use night lights. Install grab bars by the toilet and in the tub and shower. Do not use towel bars as grab bars. Use non-skid mats or decals in the tub or shower. If you need to sit down in the shower, use a plastic, non-slip stool. Keep the floor dry. Clean up any water that spills on the floor as soon as it happens. Remove soap buildup in the tub or shower regularly. Attach bath mats securely with double-sided non-slip rug tape. Do not have throw rugs and other things on the floor that can make you trip. What can I do in the bedroom? Use night lights. Make sure that you have a light by your bed that is easy to reach. Do not use  any sheets or blankets that are too big for your bed. They should not hang down onto the floor. Have a firm chair that has side arms. You can use this for support while you get dressed. Do not have throw rugs and other things on the floor that can make you trip. What can I do in the kitchen? Clean up any spills right away. Avoid walking on wet floors. Keep items that you use a lot in easy-to-reach places. If you need to reach something above you, use a strong step stool that has a grab bar. Keep electrical cords out of the way. Do not use floor polish or wax that makes floors slippery. If you must use wax, use non-skid floor wax. Do not have throw rugs and other things on the floor that can make you trip. What can I do with my stairs? Do not leave any items on the stairs. Make sure that there are handrails on both sides of the stairs and use them. Fix handrails that are broken or loose. Make sure that handrails are as long as the stairways. Check any carpeting to make sure that it is firmly attached to the stairs. Fix any carpet that is loose or worn. Avoid having throw rugs at the top or bottom of the stairs. If you do have throw rugs, attach them to the floor with carpet tape.  Make sure that you have a light switch at the top of the stairs and the bottom of the stairs. If you do not have them, ask someone to add them for you. What else can I do to help prevent falls? Wear shoes that: Do not have high heels. Have rubber bottoms. Are comfortable and fit you well. Are closed at the toe. Do not wear sandals. If you use a stepladder: Make sure that it is fully opened. Do not climb a closed stepladder. Make sure that both sides of the stepladder are locked into place. Ask someone to hold it for you, if possible. Clearly mark and make sure that you can see: Any grab bars or handrails. First and last steps. Where the edge of each step is. Use tools that help you move around (mobility aids)  if they are needed. These include: Canes. Walkers. Scooters. Crutches. Turn on the lights when you go into a dark area. Replace any light bulbs as soon as they burn out. Set up your furniture so you have a clear path. Avoid moving your furniture around. If any of your floors are uneven, fix them. If there are any pets around you, be aware of where they are. Review your medicines with your doctor. Some medicines can make you feel dizzy. This can increase your chance of falling. Ask your doctor what other things that you can do to help prevent falls. This information is not intended to replace advice given to you by your health care provider. Make sure you discuss any questions you have with your health care provider. Document Released: 04/07/2009 Document Revised: 11/17/2015 Document Reviewed: 07/16/2014 Elsevier Interactive Patient Education  2017 [employer].

## 2022-02-20 ENCOUNTER — Ambulatory Visit
Admission: RE | Admit: 2022-02-20 | Discharge: 2022-02-20 | Disposition: A | Discharge status: Home / Self Care | Payer: Medicare Other | Source: Ambulatory Visit | Attending: Family Medicine | Admitting: Family Medicine

## 2022-02-20 DIAGNOSIS — Z1231 Encounter for screening mammogram for malignant neoplasm of breast: Secondary | ICD-10-CM | POA: Diagnosis not present

## 2022-03-30 ENCOUNTER — Other Ambulatory Visit: Payer: Self-pay | Admitting: Lab

## 2022-03-30 ENCOUNTER — Ambulatory Visit: Payer: Medicare Other | Admitting: Adult Health

## 2022-03-30 ENCOUNTER — Ambulatory Visit: Payer: Medicare Other | Admitting: Nurse Practitioner

## 2022-03-30 DIAGNOSIS — F419 Anxiety disorder, unspecified: Secondary | ICD-10-CM

## 2022-03-30 MED ORDER — SERTRALINE HCL 50 MG PO TABS: 50.0000 mg | ORAL_TABLET | Freq: Every day | ORAL | 1 refills | Status: DC

## 2022-03-30 NOTE — Progress Notes (Deleted)
Office Visit    Patient Name: Kayla Brown Date of Encounter: 03/30/2022  Primary Care Provider:  [name], MD Primary Cardiologist:  Peter [name], MD  Chief Complaint   71 year old female with a history of chronic diastolic heart failure, chronic dyspnea, mild pulmonary hypertension, hypertension, hyperlipidemia, OSA, PE, and type 2 diabetes who presents for follow-up related to heart failure.  Past Medical History    Past Medical History:  Diagnosis Date   Anxiety    Congestive heart failure (CHF) (HCC)    Diabetes mellitus    Edema of extremities 03/02/2016   Essential hypertension, benign    Lazy eye    Major depressive disorder, single episode, unspecified    Obesity (BMI 30-39.9) 03/01/2016   OSA (obstructive sleep apnea)    Pulmonary HTN (HCC)    mild with PASP 74mHg by echo 05/2016   Pure hypercholesterolemia    Retinopathy, due to hypertension    Past Surgical History:  Procedure Laterality Date   CPAP TITRATION  10/14/2015   DILATION AND CURETTAGE OF UTERUS  2005-2008   x2   EYE SURGERY     cataracts   WISDOM TOOTH EXTRACTION      Allergies  No Known Allergies  History of Present Illness    71year old female with the above past medical history including chronic diastolic heart failure, chronic dyspnea, mild pulmonary hypertension, hypertension, hyperlipidemia, OSA, PE, and type 2 diabetes.  Is a history of pulmonary hypertension based on echocardiogram in 2016 and 2017 but not appreciated on echo in 2019 and 2020.  Lexiscan Myoview in August 2020 was normal.  PFTs in 2016 showed mild diffusion defect, normal flow volumes without response to bronchodilator.  CT chest in September 2020 showed extensive atelectatic changes throughout the lungs with a dependent predominance, mosaic attenuation throughout the lungs, emphysema.  She was hospitalized in September 2020 with acute hypoxic respiratory failure.  Her symptoms improved with diuresis  however, she did develop acute kidney injury in the setting of overdiuresis.  Echocardiogram at the time showed EF greater than 65%, hyperdynamic systolic function, moderate concentric LVH, impaired relaxation, no RWMA, normal RV systolic function, no significant valvular abnormalities.  Seen in the office on 12/22/2021 and was stable from a cardiac standpoint.  BP was well controlled, she was without symptoms of heart failure.  She did note some constipation.  She presents today for follow-up.  Since her last visit  Chronic diastolic heart failure: Hypertension: Hyperlipidemia: OSA: Type 2 diabetes: Disposition:  Home Medications    Current Outpatient Medications  Medication Sig Dispense Refill   albuterol (VENTOLIN HFA) 108 (90 Base) MCG/ACT inhaler Inhale 1-2 puffs into the lungs every 6 (six) hours as needed for wheezing or shortness of breath.     Alcohol Swabs (ALCOHOL PREP) PADS Pt uses an alcohol pad each time sugars are tested. Pt tests twice daily. Dx E11.9 100 each 3   amLODipine (NORVASC) 10 MG tablet TAKE 1 TABLET BY MOUTH EVERY DAY 90 tablet 1   atorvastatin (LIPITOR) 40 MG tablet TAKE 1 TABLET BY MOUTH EVERY DAY 90 tablet 1   B Complex Vitamins (B COMPLEX PO) Take 1 tablet by mouth daily.     buPROPion (WELLBUTRIN XL) 300 MG 24 hr tablet Take 1 tablet (300 mg total) by mouth daily. 90 tablet 1   Calcium Carb-Cholecalciferol [phone] MG-UNIT CAPS Take 1 tablet by mouth 2 (two) times daily.     carvedilol (COREG) 25 MG tablet TAKE 1 TABLET  BY MOUTH TWICE A DAY 180 tablet 3   dapagliflozin propanediol (FARXIGA) 10 MG TABS tablet Take 10 mg by mouth daily.     fish oil-omega-3 fatty acids 1000 MG capsule Take 1 g by mouth daily.     fluticasone (FLONASE) 50 MCG/ACT nasal spray SPRAY 2 SPRAYS INTO EACH NOSTRIL EVERY DAY (Patient taking differently: Place 2 sprays into both nostrils daily.) 18 g 1   furosemide (LASIX) 20 MG tablet TAKE 1 TABLET BY MOUTH EVERY DAY 90 tablet 1    glipiZIDE (GLUCOTROL XL) 10 MG 24 hr tablet Take 1 tablet (10 mg total) by mouth 2 (two) times daily with a meal. 180 tablet 1   glucose blood (FREESTYLE LITE) test strip USE TO TEST BLOOD GLUCOSE 2 TIMES DAILY. Dx E11.9 100 each 12   insulin glargine (LANTUS SOLOSTAR) 100 UNIT/ML Solostar Pen Inject 10 Units into the skin daily. 15 mL 1   Insulin Pen Needle (PEN NEEDLES) 32G X 4 MM MISC 1 Application by Does not apply route daily. 30 each 2   loratadine (CLARITIN) 10 MG tablet TAKE 1 TABLET BY MOUTH EVERY DAY (Patient taking differently: Take 10 mg by mouth daily.) 30 tablet 11   Melatonin 3 MG TABS Take 10 mg by mouth at bedtime.      methocarbamol (ROBAXIN) 500 MG tablet Take by mouth.     Multiple Vitamins-Minerals (VISION FORMULA) TABS Take 1 tablet by mouth daily.     OVER THE COUNTER MEDICATION Take 1 tablet by mouth daily. Equate Vision Formula 50+     Semaglutide, 1 MG/DOSE, 4 MG/3ML SOPN Inject 1 mg as directed once a week. 9 mL 0   sertraline (ZOLOFT) 50 MG tablet TAKE 1 TABLET BY MOUTH EVERY DAY 90 tablet 1   traMADol (ULTRAM) 50 MG tablet Take 1 tablet by mouth every 6 (six) hours as needed.     TRUEPLUS LANCETS 30G MISC Use one lancet each time sugars are tested. Pt tests twice daily. Dx. E11.9 100 each 3   UNABLE TO FIND Med Name: Healthwise talking meter 1 kit 0   No current facility-administered medications for this visit.     Review of Systems    ***.  All other systems reviewed and are otherwise negative except as noted above.    Physical Exam    VS:  LMP 06/25/2008  , BMI There is no height or weight on file to calculate BMI.     GEN: Well nourished, well developed, in no acute distress. HEENT: normal. Neck: Supple, no JVD, carotid bruits, or masses. Cardiac: RRR, no murmurs, rubs, or gallops. No clubbing, cyanosis, edema.  Radials/DP/PT 2+ and equal bilaterally.  Respiratory:  Respirations regular and unlabored, clear to auscultation bilaterally. GI: Soft,  nontender, nondistended, BS + x 4. MS: no deformity or atrophy. Skin: warm and dry, no rash. Neuro:  Strength and sensation are intact. Psych: Normal affect.  Accessory Clinical Findings    ECG personally reviewed by me today - *** - no acute changes.   Lab Results  Component Value Date   WBC 12.1 (H) 12/20/2021   HGB 13.6 12/20/2021   HCT 41.7 12/20/2021   MCV 93.3 12/20/2021   PLT 233 12/20/2021   Lab Results  Component Value Date   CREATININE 1.52 (H) 01/25/2022   BUN 34 (H) 01/25/2022   NA 137 01/25/2022   K 3.9 01/25/2022   CL 101 01/25/2022   CO2 28 01/25/2022   Lab Results  Component Value  Date   ALT 82 (H) 12/20/2021   AST 23 12/20/2021   ALKPHOS 68 12/20/2021   BILITOT 0.3 12/20/2021   Lab Results  Component Value Date   CHOL 143 10/24/2021   HDL 51.10 10/24/2021   LDLCALC 57 10/24/2021   LDLDIRECT 50.0 05/23/2020   TRIG 179.0 (H) 10/24/2021   CHOLHDL 3 10/24/2021    Lab Results  Component Value Date   HGBA1C 9.8 (H) 01/25/2022    Assessment & Plan    1.  ***  No BP recorded.  {Refresh Note OR Click here to enter BP  :1}***   Lenna Sciara, NP 03/30/2022, 4:31 AM

## 2022-04-05 ENCOUNTER — Ambulatory Visit: Payer: Medicare Other | Admitting: Nurse Practitioner

## 2022-04-05 NOTE — Progress Notes (Deleted)
Office Visit    Patient Name: Kayla Brown Date of Encounter: 04/05/2022  Primary Care Provider:  [name], MD Primary Cardiologist:  Peter [name], MD  Chief Complaint    71 year old female with a history of chronic diastolic heart failure, chronic dyspnea, mild pulmonary hypertension, hypertension, hyperlipidemia, OSA, PE, and type 2 diabetes who presents for follow-up related to heart failure.  Past Medical History    Past Medical History:  Diagnosis Date   Anxiety    Congestive heart failure (CHF) (HCC)    Diabetes mellitus    Edema of extremities 03/02/2016   Essential hypertension, benign    Lazy eye    Major depressive disorder, single episode, unspecified    Obesity (BMI 30-39.9) 03/01/2016   OSA (obstructive sleep apnea)    Pulmonary HTN (HCC)    mild with PASP 56mHg by echo 05/2016   Pure hypercholesterolemia    Retinopathy, due to hypertension    Past Surgical History:  Procedure Laterality Date   CPAP TITRATION  10/14/2015   DILATION AND CURETTAGE OF UTERUS  2005-2008   x2   EYE SURGERY     cataracts   WISDOM TOOTH EXTRACTION      Allergies  No Known Allergies  History of Present Illness    71year old female with the above past medical history including chronic diastolic heart failure, chronic dyspnea, mild pulmonary hypertension, hypertension, hyperlipidemia, OSA, PE, and type 2 diabetes.  Is a history of pulmonary hypertension based on echocardiogram in 2016 and 2017 but not appreciated on echo in 2019 and 2020.  Lexiscan Myoview in August 2020 was normal.  PFTs in 2016 showed mild diffusion defect, normal flow volumes without response to bronchodilator.  CT chest in September 2020 showed extensive atelectatic changes throughout the lungs with a dependent predominance, mosaic attenuation throughout the lungs, emphysema.  She was hospitalized in September 2020 with acute hypoxic respiratory failure.  Her symptoms improved with diuresis  however, she did develop acute kidney injury in the setting of overdiuresis.  Echocardiogram at the time showed EF greater than 65%, hyperdynamic systolic function, moderate concentric LVH, impaired relaxation, no RWMA, normal RV systolic function, no significant valvular abnormalities.  She was last seen in the office on 12/22/2021 and was stable from a cardiac standpoint.  BP was well controlled, she was without symptoms of heart failure.  She did note some constipation.  She presents today for follow-up.  Since her last visit  Chronic diastolic heart failure: Hypertension: Hyperlipidemia: OSA: Type 2 diabetes: Disposition:  Home Medications    Current Outpatient Medications  Medication Sig Dispense Refill   albuterol (VENTOLIN HFA) 108 (90 Base) MCG/ACT inhaler Inhale 1-2 puffs into the lungs every 6 (six) hours as needed for wheezing or shortness of breath.     Alcohol Swabs (ALCOHOL PREP) PADS Pt uses an alcohol pad each time sugars are tested. Pt tests twice daily. Dx E11.9 100 each 3   amLODipine (NORVASC) 10 MG tablet TAKE 1 TABLET BY MOUTH EVERY DAY 90 tablet 1   atorvastatin (LIPITOR) 40 MG tablet TAKE 1 TABLET BY MOUTH EVERY DAY 90 tablet 1   B Complex Vitamins (B COMPLEX PO) Take 1 tablet by mouth daily.     buPROPion (WELLBUTRIN XL) 300 MG 24 hr tablet Take 1 tablet (300 mg total) by mouth daily. 90 tablet 1   Calcium Carb-Cholecalciferol [id number] MG-UNIT CAPS Take 1 tablet by mouth 2 (two) times daily.     carvedilol (COREG) 25 MG  tablet TAKE 1 TABLET BY MOUTH TWICE A DAY 180 tablet 3   dapagliflozin propanediol (FARXIGA) 10 MG TABS tablet Take 10 mg by mouth daily.     fish oil-omega-3 fatty acids 1000 MG capsule Take 1 g by mouth daily.     fluticasone (FLONASE) 50 MCG/ACT nasal spray SPRAY 2 SPRAYS INTO EACH NOSTRIL EVERY DAY (Patient taking differently: Place 2 sprays into both nostrils daily.) 18 g 1   furosemide (LASIX) 20 MG tablet TAKE 1 TABLET BY MOUTH EVERY DAY 90  tablet 1   glipiZIDE (GLUCOTROL XL) 10 MG 24 hr tablet Take 1 tablet (10 mg total) by mouth 2 (two) times daily with a meal. 180 tablet 1   glucose blood (FREESTYLE LITE) test strip USE TO TEST BLOOD GLUCOSE 2 TIMES DAILY. Dx E11.9 100 each 12   insulin glargine (LANTUS SOLOSTAR) 100 UNIT/ML Solostar Pen Inject 10 Units into the skin daily. 15 mL 1   Insulin Pen Needle (PEN NEEDLES) 32G X 4 MM MISC 1 Application by Does not apply route daily. 30 each 2   loratadine (CLARITIN) 10 MG tablet TAKE 1 TABLET BY MOUTH EVERY DAY (Patient taking differently: Take 10 mg by mouth daily.) 30 tablet 11   Melatonin 3 MG TABS Take 10 mg by mouth at bedtime.      methocarbamol (ROBAXIN) 500 MG tablet Take by mouth.     Multiple Vitamins-Minerals (VISION FORMULA) TABS Take 1 tablet by mouth daily.     OVER THE COUNTER MEDICATION Take 1 tablet by mouth daily. Equate Vision Formula 50+     Semaglutide, 1 MG/DOSE, 4 MG/3ML SOPN Inject 1 mg as directed once a week. 9 mL 0   sertraline (ZOLOFT) 50 MG tablet Take 1 tablet (50 mg total) by mouth daily. 90 tablet 1   traMADol (ULTRAM) 50 MG tablet Take 1 tablet by mouth every 6 (six) hours as needed.     TRUEPLUS LANCETS 30G MISC Use one lancet each time sugars are tested. Pt tests twice daily. Dx. E11.9 100 each 3   UNABLE TO FIND Med Name: Healthwise talking meter 1 kit 0   No current facility-administered medications for this visit.     Review of Systems    ***.  All other systems reviewed and are otherwise negative except as noted above.    Physical Exam    VS:  LMP 06/25/2008  , BMI There is no height or weight on file to calculate BMI.     GEN: Well nourished, well developed, in no acute distress. HEENT: normal. Neck: Supple, no JVD, carotid bruits, or masses. Cardiac: RRR, no murmurs, rubs, or gallops. No clubbing, cyanosis, edema.  Radials/DP/PT 2+ and equal bilaterally.  Respiratory:  Respirations regular and unlabored, clear to auscultation  bilaterally. GI: Soft, nontender, nondistended, BS + x 4. MS: no deformity or atrophy. Skin: warm and dry, no rash. Neuro:  Strength and sensation are intact. Psych: Normal affect.  Accessory Clinical Findings    ECG personally reviewed by me today - *** - no acute changes.   Lab Results  Component Value Date   WBC 12.1 (H) 12/20/2021   HGB 13.6 12/20/2021   HCT 41.7 12/20/2021   MCV 93.3 12/20/2021   PLT 233 12/20/2021   Lab Results  Component Value Date   CREATININE 1.52 (H) 01/25/2022   BUN 34 (H) 01/25/2022   NA 137 01/25/2022   K 3.9 01/25/2022   CL 101 01/25/2022   CO2 28 01/25/2022  Lab Results  Component Value Date   ALT 82 (H) 12/20/2021   AST 23 12/20/2021   ALKPHOS 68 12/20/2021   BILITOT 0.3 12/20/2021   Lab Results  Component Value Date   CHOL 143 10/24/2021   HDL 51.10 10/24/2021   LDLCALC 57 10/24/2021   LDLDIRECT 50.0 05/23/2020   TRIG 179.0 (H) 10/24/2021   CHOLHDL 3 10/24/2021    Lab Results  Component Value Date   HGBA1C 9.8 (H) 01/25/2022    Assessment & Plan    1.  ***  No BP recorded.  {Refresh Note OR Click here to enter BP  :1}***   Lenna Sciara, NP 04/05/2022, 4:38 AM

## 2022-04-09 ENCOUNTER — Ambulatory Visit (INDEPENDENT_AMBULATORY_CARE_PROVIDER_SITE_OTHER): Payer: Medicare Other | Admitting: Podiatry

## 2022-04-09 ENCOUNTER — Encounter: Payer: Self-pay | Admitting: Podiatry

## 2022-04-09 DIAGNOSIS — E1122 Type 2 diabetes mellitus with diabetic chronic kidney disease: Secondary | ICD-10-CM

## 2022-04-09 DIAGNOSIS — B351 Tinea unguium: Secondary | ICD-10-CM

## 2022-04-09 DIAGNOSIS — M79674 Pain in right toe(s): Secondary | ICD-10-CM | POA: Diagnosis not present

## 2022-04-09 DIAGNOSIS — M79675 Pain in left toe(s): Secondary | ICD-10-CM

## 2022-04-09 DIAGNOSIS — N1832 Chronic kidney disease, stage 3b: Secondary | ICD-10-CM

## 2022-04-09 NOTE — Progress Notes (Signed)
This patient returns to my office for at risk foot care.  This patient requires this care by a professional since this patient will be at risk due to having diabetes mellitus.  This patient is unable to cut nails herself since the patient cannot reach her nails.These nails are painful walking and wearing shoes.  This patient presents for at risk foot care today.  General Appearance  Alert, conversant and in no acute stress.  Vascular  Dorsalis pedis and posterior tibial  pulses are palpable  bilaterally.  Capillary return is within normal limits  bilaterally. Temperature is within normal limits  bilaterally.  Neurologic  Senn-Weinstein monofilament wire test within normal limits  bilaterally. Muscle power within normal limits bilaterally.  Nails Thick disfigured discolored nails with subungual debris  from hallux to fifth toes bilaterally. No evidence of bacterial infection or drainage bilaterally.  Orthopedic  No limitations of motion  feet .  No crepitus or effusions noted.  No bony pathology or digital deformities noted.  HAV  B/L.  Hammer toes  B/L.  Skin  normotropic skin with no porokeratosis noted bilaterally.  No signs of infections or ulcers noted.     Onychomycosis  Pain in right toes  Pain in left toes  Consent was obtained for treatment procedures.   Mechanical debridement of nails 1-5  bilaterally performed with a nail nipper.  Filed with dremel without incident.    Return office visit   3   mos.                  Told patient to return for periodic foot care and evaluation due to potential at risk complications.   Jonice Cerra DPM   

## 2022-04-10 ENCOUNTER — Other Ambulatory Visit: Payer: Self-pay | Admitting: Family Medicine

## 2022-04-10 NOTE — Telephone Encounter (Signed)
Patient is requesting a refill of the following medications: Requested Prescriptions   Pending Prescriptions Disp Refills   buPROPion (WELLBUTRIN XL) 300 MG 24 hr tablet [Pharmacy Med Name: buPROPion HCl ER (XL) 300 MG Oral Tablet Extended Release 24 Hour] 90 tablet 0    Sig: Take 1 tablet by mouth once daily    Date of patient request: 04/10/2022 Last office visit: 01/25/2022 Date of last refill: 08/22/2021 Last refill amount: 90 tab

## 2022-04-18 ENCOUNTER — Other Ambulatory Visit: Payer: Self-pay | Admitting: Family Medicine

## 2022-04-23 ENCOUNTER — Other Ambulatory Visit: Payer: Self-pay | Admitting: Family Medicine

## 2022-04-23 ENCOUNTER — Other Ambulatory Visit: Payer: Self-pay | Admitting: Family

## 2022-04-23 DIAGNOSIS — E1165 Type 2 diabetes mellitus with hyperglycemia: Secondary | ICD-10-CM

## 2022-04-24 ENCOUNTER — Ambulatory Visit: Payer: Medicare Other | Attending: Cardiology | Admitting: Nurse Practitioner

## 2022-04-24 ENCOUNTER — Other Ambulatory Visit: Payer: Self-pay

## 2022-04-24 ENCOUNTER — Encounter: Payer: Self-pay | Admitting: Nurse Practitioner

## 2022-04-24 ENCOUNTER — Telehealth: Payer: Self-pay

## 2022-04-24 VITALS — BP 120/62 | HR 64 | Ht 60.0 in | Wt 174.6 lb

## 2022-04-24 DIAGNOSIS — I10 Essential (primary) hypertension: Secondary | ICD-10-CM | POA: Insufficient documentation

## 2022-04-24 DIAGNOSIS — I5032 Chronic diastolic (congestive) heart failure: Secondary | ICD-10-CM | POA: Diagnosis not present

## 2022-04-24 DIAGNOSIS — Z794 Long term (current) use of insulin: Secondary | ICD-10-CM | POA: Diagnosis not present

## 2022-04-24 DIAGNOSIS — E1165 Type 2 diabetes mellitus with hyperglycemia: Secondary | ICD-10-CM | POA: Diagnosis not present

## 2022-04-24 DIAGNOSIS — E7800 Pure hypercholesterolemia, unspecified: Secondary | ICD-10-CM | POA: Insufficient documentation

## 2022-04-24 DIAGNOSIS — E1122 Type 2 diabetes mellitus with diabetic chronic kidney disease: Secondary | ICD-10-CM

## 2022-04-24 DIAGNOSIS — G4733 Obstructive sleep apnea (adult) (pediatric): Secondary | ICD-10-CM | POA: Diagnosis not present

## 2022-04-24 MED ORDER — SEMAGLUTIDE (1 MG/DOSE) 4 MG/3ML ~~LOC~~ SOPN: 1.0000 mg | PEN_INJECTOR | SUBCUTANEOUS | 0 refills | Status: DC

## 2022-04-24 NOTE — Telephone Encounter (Signed)
Informed pt that her Ozempic came in and she states she will pick up on Monday Nov 6,2023. Her medicine is in the door of fridge on  Dr Birdie Riddle side

## 2022-04-24 NOTE — Patient Instructions (Addendum)
Medication Instructions:  Your physician recommends that you continue on your current medications as directed. Please refer to the Current Medication list given to you today.   *If you need a refill on your cardiac medications before your next appointment, please call your pharmacy*   Lab Work: NONE ordered at this time of appointment  If you have labs (blood work) drawn today and your tests are completely normal, you will receive your results only by: MyChart Message (if you have MyChart) OR A paper copy in the mail If you have any lab test that is abnormal or we need to change your treatment, we will call you to review the results.   Testing/Procedures: NONE ordered at this time of appointment     Follow-Up: At [facility], you and your health needs are our priority.  As part of our continuing mission to provide you with exceptional heart care, we have created designated Provider Care Teams.  These Care Teams include your primary Cardiologist (physician) and Advanced Practice Providers (APPs -  Physician Assistants and Nurse Practitioners) who all work together to provide you with the care you need, when you need it.  We recommend signing up for the patient portal called "MyChart".  Sign up information is provided on this After Visit Summary.  MyChart is used to connect with patients for Virtual Visits (Telemedicine).  Patients are able to view lab/test results, encounter notes, upcoming appointments, etc.  Non-urgent messages can be sent to your provider as well.   To learn more about what you can do with MyChart, go to [email or web address].    Your next appointment:   4-6 month(s)  The format for your next appointment:   In Person  Provider:   Peter Jordan, MD     Other Instructions   Important Information About Sugar       

## 2022-04-24 NOTE — Progress Notes (Signed)
Office Visit    Patient Name: Kayla Brown Date of Encounter: 04/24/2022  Primary Care Provider:  [name], MD Primary Cardiologist:  Peter [name], MD  Chief Complaint    71 year old female with a history of chronic diastolic heart failure, chronic dyspnea, mild pulmonary hypertension, hypertension, hyperlipidemia, OSA, PE, and type 2 diabetes who presents for follow-up related to heart failure.  Past Medical History    Past Medical History:  Diagnosis Date   Anxiety    Congestive heart failure (CHF) (HCC)    Diabetes mellitus    Edema of extremities 03/02/2016   Essential hypertension, benign    Lazy eye    Major depressive disorder, single episode, unspecified    Obesity (BMI 30-39.9) 03/01/2016   OSA (obstructive sleep apnea)    Pulmonary HTN (HCC)    mild with PASP 80mHg by echo 05/2016   Pure hypercholesterolemia    Retinopathy, due to hypertension    Past Surgical History:  Procedure Laterality Date   CPAP TITRATION  10/14/2015   DILATION AND CURETTAGE OF UTERUS  2005-2008   x2   EYE SURGERY     cataracts   WISDOM TOOTH EXTRACTION      Allergies  No Known Allergies  History of Present Illness    71year old female with the above past medical history including chronic diastolic heart failure, chronic dyspnea, mild pulmonary hypertension, hypertension, hyperlipidemia, OSA, PE, and type 2 diabetes.  She has a history of pulmonary hypertension based on echocardiogram in 2016 and 2017 but not appreciated on echo in 2019 and 2020.  Lexiscan Myoview in August 2020 was normal.  PFTs in 2016 showed mild diffusion defect, normal flow volumes without response to bronchodilator.  CT chest in September 2020 showed extensive atelectatic changes throughout the lungs with a dependent predominance, mosaic attenuation throughout the lungs, emphysema.  She was hospitalized in September 2020 with acute hypoxic respiratory failure.  Her symptoms improved with  diuresis however, she did develop acute kidney injury in the setting of overdiuresis. Echocardiogram at the time showed EF greater than 65%, hyperdynamic systolic function, moderate concentric LVH, impaired relaxation, no RWMA, normal RV systolic function, no significant valvular abnormalities.  She was last seen in the office on 12/22/2021 and was stable from a cardiac standpoint.  BP was well controlled, she was without symptoms of heart failure.  She did note some constipation.   She presents today for follow-up.  Since her last visit she has done well from a cardiac standpoint.  Her weight is up slightly, however, she denies any dyspnea, edema, PND, orthopnea.  BP has been well controlled.  She has been taking an additional 20 mg of Lasix as needed for weight gain.  This seems to be controlling her symptoms well.   Home Medications    Current Outpatient Medications  Medication Sig Dispense Refill   albuterol (VENTOLIN HFA) 108 (90 Base) MCG/ACT inhaler Inhale 1-2 puffs into the lungs every 6 (six) hours as needed for wheezing or shortness of breath.     Alcohol Swabs (ALCOHOL PREP) PADS Pt uses an alcohol pad each time sugars are tested. Pt tests twice daily. Dx E11.9 100 each 3   amLODipine (NORVASC) 10 MG tablet TAKE 1 TABLET BY MOUTH EVERY DAY 90 tablet 1   atorvastatin (LIPITOR) 40 MG tablet TAKE 1 TABLET BY MOUTH EVERY DAY 90 tablet 1   B Complex Vitamins (B COMPLEX PO) Take 1 tablet by mouth daily.     buPROPion (  WELLBUTRIN XL) 300 MG 24 hr tablet Take 1 tablet by mouth once daily 90 tablet 0   Calcium Carb-Cholecalciferol [id number] MG-UNIT CAPS Take 1 tablet by mouth 2 (two) times daily.     carvedilol (COREG) 25 MG tablet TAKE 1 TABLET BY MOUTH TWICE A DAY 180 tablet 3   dapagliflozin propanediol (FARXIGA) 10 MG TABS tablet Take 10 mg by mouth daily.     fish oil-omega-3 fatty acids 1000 MG capsule Take 1 g by mouth daily.     fluticasone (FLONASE) 50 MCG/ACT nasal spray SPRAY 2 SPRAYS  INTO EACH NOSTRIL EVERY DAY (Patient taking differently: Place 2 sprays into both nostrils daily.) 18 g 1   furosemide (LASIX) 20 MG tablet TAKE 1 TABLET BY MOUTH EVERY DAY 90 tablet 1   glipiZIDE (GLUCOTROL XL) 10 MG 24 hr tablet Take 1 tablet (10 mg total) by mouth 2 (two) times daily with a meal. 180 tablet 1   glucose blood (FREESTYLE LITE) test strip USE TO TEST BLOOD GLUCOSE 2 TIMES DAILY. Dx E11.9 100 each 12   insulin glargine (LANTUS SOLOSTAR) 100 UNIT/ML Solostar Pen Inject 10 Units into the skin daily. 15 mL 1   Insulin Pen Needle (PEN NEEDLES) 32G X 4 MM MISC 1 Application by Does not apply route daily. 30 each 2   loratadine (CLARITIN) 10 MG tablet TAKE 1 TABLET BY MOUTH EVERY DAY (Patient taking differently: Take 10 mg by mouth daily.) 30 tablet 11   Melatonin 3 MG TABS Take 10 mg by mouth at bedtime.      methocarbamol (ROBAXIN) 500 MG tablet Take by mouth.     Multiple Vitamins-Minerals (VISION FORMULA) TABS Take 1 tablet by mouth daily.     OVER THE COUNTER MEDICATION Take 1 tablet by mouth daily. Equate Vision Formula 50+     Semaglutide, 1 MG/DOSE, 4 MG/3ML SOPN Inject 1 mg as directed once a week. 9 mL 0   sertraline (ZOLOFT) 50 MG tablet Take 1 tablet (50 mg total) by mouth daily. 90 tablet 1   traMADol (ULTRAM) 50 MG tablet Take 1 tablet by mouth every 6 (six) hours as needed.     TRUEPLUS LANCETS 30G MISC Use one lancet each time sugars are tested. Pt tests twice daily. Dx. E11.9 100 each 3   UNABLE TO FIND Med Name: Healthwise talking meter 1 kit 0   No current facility-administered medications for this visit.     Review of Systems    She denies chest pain, palpitations, dyspnea, pnd, orthopnea, n, v, dizziness, syncope, edema, weight gain, or early satiety. All other systems reviewed and are otherwise negative except as noted above.   Physical Exam    VS:  BP 120/62 (BP Location: Left Arm, Cuff Size: Normal)   Pulse 64   Ht 5' (1.524 m)   Wt 174 lb 9.6 oz (79.2  kg)   LMP 06/25/2008   SpO2 97%   BMI 34.10 kg/m  GEN: Well nourished, well developed, in no acute distress. HEENT: normal. Neck: Supple, no JVD, carotid bruits, or masses. Cardiac: RRR, no murmurs, rubs, or gallops. No clubbing, cyanosis, edema.  Radials/DP/PT 2+ and equal bilaterally.  Respiratory:  Respirations regular and unlabored, clear to auscultation bilaterally. GI: Soft, nontender, nondistended, BS + x 4. MS: no deformity or atrophy. Skin: warm and dry, no rash. Neuro:  Strength and sensation are intact. Psych: Normal affect.  Accessory Clinical Findings    ECG personally reviewed by me today -no EKG in  office today.   Lab Results  Component Value Date   WBC 12.1 (H) 12/20/2021   HGB 13.6 12/20/2021   HCT 41.7 12/20/2021   MCV 93.3 12/20/2021   PLT 233 12/20/2021   Lab Results  Component Value Date   CREATININE 1.52 (H) 01/25/2022   BUN 34 (H) 01/25/2022   NA 137 01/25/2022   K 3.9 01/25/2022   CL 101 01/25/2022   CO2 28 01/25/2022   Lab Results  Component Value Date   ALT 82 (H) 12/20/2021   AST 23 12/20/2021   ALKPHOS 68 12/20/2021   BILITOT 0.3 12/20/2021   Lab Results  Component Value Date   CHOL 143 10/24/2021   HDL 51.10 10/24/2021   LDLCALC 57 10/24/2021   LDLDIRECT 50.0 05/23/2020   TRIG 179.0 (H) 10/24/2021   CHOLHDL 3 10/24/2021    Lab Results  Component Value Date   HGBA1C 9.8 (H) 01/25/2022    Assessment & Plan   1. Chronic diastolic heart failure: Most recent echo in 2020  showed EF greater than 65%, hyperdynamic systolic function, moderate concentric LVH, impaired relaxation, no RWMA, normal RV systolic function, no significant valvular abnormalities.  Weight is up slightly since her last visit, overall euvolemic and well compensated on exam.  She has been taking an additional 20 mg of Lasix daily as needed for weight gain.  Continue carvedilol, Lasix, Farxiga.  2. Hypertension: BP well controlled. Continue current antihypertensive  regimen.   3. Hyperlipidemia: LDL was 57 in 10/2021.  Continue Lipitor.  4. OSA: Adherent to CPAP.  Denies any concerns.   5. Type 2 diabetes:  A1c was 9.8 in 01/2022.  Monitored and  managed per PCP.  6. Disposition: Follow-up in 4-6 months.       Lenna Sciara, NP 04/24/2022, 8:25 AM

## 2022-04-25 ENCOUNTER — Telehealth: Payer: Self-pay | Admitting: Family Medicine

## 2022-04-25 ENCOUNTER — Other Ambulatory Visit: Payer: Self-pay | Admitting: Family Medicine

## 2022-04-25 DIAGNOSIS — E1165 Type 2 diabetes mellitus with hyperglycemia: Secondary | ICD-10-CM

## 2022-04-25 NOTE — Telephone Encounter (Signed)
Received forms from [employer] Patient Assistance Program for pt. Placed in front bin with charge sheet.

## 2022-04-25 NOTE — Telephone Encounter (Signed)
Forms placed in Dr Birdie Riddle to be signed folder . Once signed I will fax back

## 2022-04-26 ENCOUNTER — Telehealth: Payer: Self-pay | Admitting: Pharmacist

## 2022-04-26 NOTE — Progress Notes (Signed)
Chronic Care Management Pharmacy Assistant   Name: Kayla Brown  MRN: [id number] DOB: [birth year]   Reason for Encounter: Disease State - Diabetes Call     Recent office visits:  None noted.   Recent consult visits:  04/24/22 Diona Browner NP - Diastolic CHF - Cardiology - No medication changes. Follow up in 4-6 months.  04/09/22 Gardiner Barefoot, DPM - Podiatry - Mechanical debridement of nails 1-5  bilaterally performed with a nail nipper.  Filed with dremel without incident. Follow up in 3 months.   Hospital visits: 12/20/21 - 12/22/21 Medication Reconciliation was completed by comparing discharge summary, patient's EMR and Pharmacy list, and upon discussion with patient.  Admitted to the hospital on 12/20/21 due to Hyperglycemia. Discharge date was 12/22/21. Discharged from [facility]?Medications Started at [facility] Discharge:?? None noted.   Medication Changes at Hospital Discharge: None noted.  Medications Discontinued at Hospital Discharge: None noted.  Medications that remain the same after Hospital Discharge:??  All other medications will remain the same.    Medications: Outpatient Encounter Medications as of 04/26/2022  Medication Sig Note   albuterol (VENTOLIN HFA) 108 (90 Base) MCG/ACT inhaler Inhale 1-2 puffs into the lungs every 6 (six) hours as needed for wheezing or shortness of breath.    Alcohol Swabs (ALCOHOL PREP) PADS Pt uses an alcohol pad each time sugars are tested. Pt tests twice daily. Dx E11.9    amLODipine (NORVASC) 10 MG tablet TAKE 1 TABLET BY MOUTH EVERY DAY    atorvastatin (LIPITOR) 40 MG tablet TAKE 1 TABLET BY MOUTH EVERY DAY    B Complex Vitamins (B COMPLEX PO) Take 1 tablet by mouth daily.    buPROPion (WELLBUTRIN XL) 300 MG 24 hr tablet Take 1 tablet by mouth once daily    Calcium Carb-Cholecalciferol [phone] MG-UNIT CAPS Take 1 tablet by mouth 2 (two) times daily.    carvedilol (COREG) 25 MG tablet TAKE 1 TABLET BY MOUTH  TWICE A DAY    dapagliflozin propanediol (FARXIGA) 10 MG TABS tablet Take 10 mg by mouth daily. 01/25/2021: Gets through patient assistance.    fish oil-omega-3 fatty acids 1000 MG capsule Take 1 g by mouth daily.    fluticasone (FLONASE) 50 MCG/ACT nasal spray SPRAY 2 SPRAYS INTO EACH NOSTRIL EVERY DAY (Patient taking differently: Place 2 sprays into both nostrils daily.)    furosemide (LASIX) 20 MG tablet TAKE 1 TABLET BY MOUTH EVERY DAY    glipiZIDE (GLUCOTROL XL) 10 MG 24 hr tablet TAKE 1 TABLET BY MOUTH 2 (TWO) TIMES DAILY WITH A MEAL.    glucose blood (FREESTYLE LITE) test strip USE TO TEST BLOOD GLUCOSE 2 TIMES DAILY. Dx E11.9    insulin glargine (LANTUS SOLOSTAR) 100 UNIT/ML Solostar Pen Inject 10 Units into the skin daily.    Insulin Pen Needle (PEN NEEDLES) 32G X 4 MM MISC 1 Application by Does not apply route daily.    loratadine (CLARITIN) 10 MG tablet TAKE 1 TABLET BY MOUTH EVERY DAY (Patient taking differently: Take 10 mg by mouth daily.)    Melatonin 3 MG TABS Take 10 mg by mouth at bedtime.     methocarbamol (ROBAXIN) 500 MG tablet Take by mouth.    Multiple Vitamins-Minerals (VISION FORMULA) TABS Take 1 tablet by mouth daily.    OVER THE COUNTER MEDICATION Take 1 tablet by mouth daily. Equate Vision Formula 50+    Semaglutide, 1 MG/DOSE, 4 MG/3ML SOPN Inject 1 mg as directed once  a week.    sertraline (ZOLOFT) 50 MG tablet Take 1 tablet (50 mg total) by mouth daily.    traMADol (ULTRAM) 50 MG tablet Take 1 tablet by mouth every 6 (six) hours as needed.    TRUEPLUS LANCETS 30G MISC Use one lancet each time sugars are tested. Pt tests twice daily. Dx. E11.9    UNABLE TO FIND Med Name: Healthwise talking meter    No facility-administered encounter medications on file as of 04/26/2022.   Current antihyperglycemic regimen:  insulin glargine (LANTUS SOLOSTAR) 100 UNIT/ML Solostar Pen  glipiZIDE (GLUCOTROL XL) 10 MG 24 hr tablet  Semaglutide, 1 MG/DOSE, 4 MG/3ML SOPN  dapagliflozin  propanediol (FARXIGA) 10 MG TABS tablet   What recent interventions/DTPs have been made to improve glycemic control:  Patient denied any recent changes in medications since last visit with CPP   Have there been any recent hospitalizations or ED visits since last visit with CPP?  Patient had ED visit for hyperglycemia 12/20/21-12/22/21  Patient denies hypoglycemic symptoms, including Pale, Sweaty, Shaky, Hungry, Nervous/irritable, and Vision changes   Patient denies hyperglycemic symptoms, including blurry vision, excessive thirst, fatigue, polyuria, and weakness   How often are you checking your blood sugar? Patient reported checking blood sugars daily   What are your blood sugars ranging?  Fasting: 110-150 Before meals:  After meals:  Bedtime:   During the week, how often does your blood glucose drop below 70?  Patient denied any readings of 70 or lower recently.  Are you checking your feet daily/regularly? Patient reported checking feet regularly and does not currently have any concerns.   Adherence Review: Is the patient currently on a STATIN medication? Yes Is the patient currently on ACE/ARB medication? No Does the patient have >5 day gap between last estimated fill dates? No    Care Gaps   AWV: done 02/01/22 Colonoscopy: due 12/15/26 DM Eye Exam: due 08/01/21 (has appt 08/04/21) DM Foot Exam: due 05/12/22 Microalbumin: done 05/22/21 HbgAIC: done done 01/25/22 (9.8) DEXA: done 05/29/21 Mammogram: done 02/06/21     Star Rating Drugs: atorvastatin (LIPITOR) 40 MG tablet - last filled 04/24/22 90 days  glipiZIDE (GLUCOTROL) 5 MG tablet - last filled 03/23/22 90 days     Future Appointments  Date Time Provider Department Center  04/30/2022  8:00 AM Sheliah Hatch, MD [facility]  07/05/2022  9:00 AM Waymon Budge, MD [facility] None  07/11/2022  9:00 AM Helane Gunther, DPM [facility]  09/06/2022  8:20 AM [place], [name], MD [facility] None  02/07/2023   8:45 AM [facility]   Ozempic PAP mailed to patient for completion for 2023-2024 enrollment year.   [name], [facility] Clinical Pharmacist Assistant  [phone]

## 2022-04-30 ENCOUNTER — Encounter: Payer: Self-pay | Admitting: Family Medicine

## 2022-04-30 ENCOUNTER — Ambulatory Visit (INDEPENDENT_AMBULATORY_CARE_PROVIDER_SITE_OTHER): Payer: Medicare Other | Admitting: Family Medicine

## 2022-04-30 VITALS — BP 122/80 | HR 60 | Temp 98.9°F | Resp 17 | Ht 61.0 in | Wt 176.4 lb

## 2022-04-30 DIAGNOSIS — I10 Essential (primary) hypertension: Secondary | ICD-10-CM | POA: Diagnosis not present

## 2022-04-30 DIAGNOSIS — Z23 Encounter for immunization: Secondary | ICD-10-CM

## 2022-04-30 DIAGNOSIS — E785 Hyperlipidemia, unspecified: Secondary | ICD-10-CM

## 2022-04-30 DIAGNOSIS — E1165 Type 2 diabetes mellitus with hyperglycemia: Secondary | ICD-10-CM | POA: Diagnosis not present

## 2022-04-30 DIAGNOSIS — Z794 Long term (current) use of insulin: Secondary | ICD-10-CM

## 2022-04-30 LAB — CBC WITH DIFFERENTIAL/PLATELET
Basophils Absolute: 0 10*3/uL (ref 0.0–0.1)
Basophils Relative: 0.7 % (ref 0.0–3.0)
Eosinophils Absolute: 0.2 10*3/uL (ref 0.0–0.7)
Eosinophils Relative: 2.2 % (ref 0.0–5.0)
HCT: 41.4 % (ref 36.0–46.0)
Hemoglobin: 13.5 g/dL (ref 12.0–15.0)
Lymphocytes Relative: 23.4 % (ref 12.0–46.0)
Lymphs Abs: 1.6 10*3/uL (ref 0.7–4.0)
MCHC: 32.6 g/dL (ref 30.0–36.0)
MCV: 93.5 fl (ref 78.0–100.0)
Monocytes Absolute: 0.8 10*3/uL (ref 0.1–1.0)
Monocytes Relative: 12 % (ref 3.0–12.0)
Neutro Abs: 4.2 10*3/uL (ref 1.4–7.7)
Neutrophils Relative %: 61.7 % (ref 43.0–77.0)
Platelets: 198 10*3/uL (ref 150.0–400.0)
RBC: 4.43 Mil/uL (ref 3.87–5.11)
RDW: 13.3 % (ref 11.5–15.5)
WBC: 6.9 10*3/uL (ref 4.0–10.5)

## 2022-04-30 LAB — HEPATIC FUNCTION PANEL
ALT: 39 U/L — ABNORMAL HIGH (ref 0–35)
AST: 20 U/L (ref 0–37)
Albumin: 4 g/dL (ref 3.5–5.2)
Alkaline Phosphatase: 64 U/L (ref 39–117)
Bilirubin, Direct: 0.1 mg/dL (ref 0.0–0.3)
Total Bilirubin: 0.3 mg/dL (ref 0.2–1.2)
Total Protein: 6.2 g/dL (ref 6.0–8.3)

## 2022-04-30 LAB — BASIC METABOLIC PANEL
BUN: 41 mg/dL — ABNORMAL HIGH (ref 6–23)
CO2: 27 mEq/L (ref 19–32)
Calcium: 9.9 mg/dL (ref 8.4–10.5)
Chloride: 105 mEq/L (ref 96–112)
Creatinine, Ser: 1.39 mg/dL — ABNORMAL HIGH (ref 0.40–1.20)
GFR: 38.56 mL/min — ABNORMAL LOW (ref >60.00)
Glucose, Bld: 70 mg/dL (ref 70–99)
Potassium: 4.3 mEq/L (ref 3.5–5.1)
Sodium: 140 mEq/L (ref 135–145)

## 2022-04-30 LAB — LIPID PANEL
Cholesterol: 125 mg/dL (ref 0–200)
HDL: 60.9 mg/dL (ref >39.00)
LDL Cholesterol: 52 mg/dL (ref 0–99)
NonHDL: 63.64
Total CHOL/HDL Ratio: 2
Triglycerides: 59 mg/dL (ref 0.0–149.0)
VLDL: 11.8 mg/dL (ref 0.0–40.0)

## 2022-04-30 LAB — HEMOGLOBIN A1C: Hgb A1c MFr Bld: 7.5 % — ABNORMAL HIGH (ref 4.6–6.5)

## 2022-04-30 LAB — TSH: TSH: 1.51 u[IU]/mL (ref 0.35–5.50)

## 2022-04-30 LAB — MICROALBUMIN / CREATININE URINE RATIO
Creatinine,U: 99.2 mg/dL
Microalb Creat Ratio: 1.2 mg/g (ref 0.0–30.0)
Microalb, Ur: 1.2 mg/dL (ref 0.0–1.9)

## 2022-04-30 NOTE — Progress Notes (Signed)
   Subjective:    Patient ID: Kayla Brown, female    DOB: Nov 04, 1950, 71 y.o.   MRN: [id number]  HPI DM- chronic problem, on Farxiga 10mg  daily, Glipizide XL 10mg  BID, Lantus 10 units nightly, Ozempic 1mg  weekly.  Last A1C 9.8%.  UTD on eye exam, foot exam.  Due for microalbumin.  Home sugars have been much better controlled w/ addition of Lantus.  Denies symptomatic lows.  No numbness/tingling of hands/feet  HTN- chronic problem, on Amlodipine 10mg  daily, Coreg 25mg  BID, Lasix 20mg  daily w/ adequate control.  Denies CP, SOB, HA's visual changes  Hyperlipidemia- chronic problem, on Lipitor 40mg  daily.  No abd pain, N/V   Review of Systems For ROS see HPI     Objective:   Physical Exam Vitals reviewed.  Constitutional:      General: She is not in acute distress.    Appearance: Normal appearance. She is well-developed. She is obese. She is not ill-appearing.  HENT:     Head: Normocephalic and atraumatic.  Eyes:     Conjunctiva/sclera: Conjunctivae normal.     Pupils: Pupils are equal, round, and reactive to light.  Neck:     Thyroid: No thyromegaly.  Cardiovascular:     Rate and Rhythm: Normal rate and regular rhythm.     Pulses: Normal pulses.     Heart sounds: Normal heart sounds. No murmur heard. Pulmonary:     Effort: Pulmonary effort is normal. No respiratory distress.     Breath sounds: Normal breath sounds.  Abdominal:     General: There is no distension.     Palpations: Abdomen is soft.     Tenderness: There is no abdominal tenderness.  Musculoskeletal:     Cervical back: Normal range of motion and neck supple.     Right lower leg: No edema.     Left lower leg: No edema.  Lymphadenopathy:     Cervical: No cervical adenopathy.  Skin:    General: Skin is warm and dry.  Neurological:     General: No focal deficit present.     Mental Status: She is alert and oriented to person, place, and time.  Psychiatric:        Behavior: Behavior normal.           Assessment & Plan:

## 2022-04-30 NOTE — Assessment & Plan Note (Signed)
Chronic problem.  On Farxiga 10mg  daily, Glipizide XL 10mg  BID, Ozempic 1mg  weekly, and Lantus 10 units nightly.  UTD on eye exam, foot exam.  Due for Microalbumin at end of the month- will get today.  Home CBGs look MUCH better.  Applauded her efforts.  Check labs.  Adjust meds prn

## 2022-04-30 NOTE — Telephone Encounter (Signed)
Forms have been faxed and placed in scan  

## 2022-04-30 NOTE — Assessment & Plan Note (Signed)
Chronic problem.  Currently on Lipitor 40mg daily w/o difficulty.  Check labs.  Adjust meds prn  

## 2022-04-30 NOTE — Patient Instructions (Signed)
Follow up in 3-4 months to recheck sugars We'll notify you of your lab results and make any changes if needed Keep up the good work on healthy diet and regular physical activity Call with any questions or concerns Stay safe!  Stay healthy! Happy Holidays!!!

## 2022-04-30 NOTE — Telephone Encounter (Signed)
Form completed and returned to [facility] 

## 2022-04-30 NOTE — Assessment & Plan Note (Signed)
Chronic problem.  Currently well controlled on Amlodipine 10mg  daily, Coreg 25mg  BID, Lasix 20mg  daily.  Asymptomatic.  No med changes at this time

## 2022-05-01 NOTE — Progress Notes (Signed)
Informed pt of lab results  

## 2022-05-09 NOTE — Telephone Encounter (Signed)
Patient called asking for the forms for patient assistance for Ozempic 2024 would like them sent to her via [employer]

## 2022-05-21 ENCOUNTER — Telehealth: Payer: Self-pay | Admitting: Family Medicine

## 2022-05-21 NOTE — Telephone Encounter (Signed)
Letter sent via My Chart.

## 2022-05-21 NOTE — Telephone Encounter (Signed)
Pt states she has to use a walker to walk and  her hearing is bad misplaced one of hearing aids .

## 2022-05-21 NOTE — Telephone Encounter (Signed)
Does pt have a specific reason why she feels she is unable to serve as a juror?  I'm not opposed to writing the letter but I'm not sure why she feels she is 'medically unfit'

## 2022-05-21 NOTE — Telephone Encounter (Signed)
Patient received a summons for jury duty but called in stating that she needs a letter from Dr Beverely Low stating that she is medically unfit to serve. Stated that she can drop the summons off in case we need it but wanted someone to call her back to let her know. I let her know that I would send this message to the CMA

## 2022-05-21 NOTE — Telephone Encounter (Signed)
Pt is asking if we can send her a letter for jury duty

## 2022-05-23 DIAGNOSIS — I129 Hypertensive chronic kidney disease with stage 1 through stage 4 chronic kidney disease, or unspecified chronic kidney disease: Secondary | ICD-10-CM | POA: Diagnosis not present

## 2022-05-23 DIAGNOSIS — E785 Hyperlipidemia, unspecified: Secondary | ICD-10-CM | POA: Diagnosis not present

## 2022-05-23 DIAGNOSIS — N1831 Chronic kidney disease, stage 3a: Secondary | ICD-10-CM | POA: Diagnosis not present

## 2022-05-23 DIAGNOSIS — E1122 Type 2 diabetes mellitus with diabetic chronic kidney disease: Secondary | ICD-10-CM | POA: Diagnosis not present

## 2022-05-23 DIAGNOSIS — N2581 Secondary hyperparathyroidism of renal origin: Secondary | ICD-10-CM | POA: Diagnosis not present

## 2022-07-03 NOTE — Progress Notes (Unsigned)
HPI F never smoker followed for OSA, Dyspnea improved after treatment for heart failure. Complicated by anxiety and depression, DM2, HBP, Hyperlipidemia, Morbid obesity, OSA, Pulmonary Hypertension(PASP 34mmHg by echo 02/2015),   NPSG 05/14/16 AHI 93/ hr, desaturation to 84% PFT 04/04/15   - mild Diffusion defect, normal flows and volumes without response to bronchodilator PFT 04/24/2019- minimal obst w airtrapping, sl resp to BD, Nl DLCO 2D echo February 02, 2019 EF 18-56%, normal systolic function right ventricle, mild to moderately dilated left atrium, RV systolic function appeared to be normal with normal RV size 2D echo March 06, 2019 EF greater than 65%, moderate LVH, right ventricle normal systolic function, right ventricle systolic pressure could not be assessed.  CT chest March 05, 2019 extensive atelectatic changes throughout the lungs with a dependent predominance, mosaic attenuation throughout the lungs, emphysema  VQ scan 03/04/2019 no unmatched segmental perfusion defects to suggest acute PE Autoimmune work-up negative 2020 , SH /Occupational hx unrevealing  ------------------------------------------------------------------------------------------   07/04/21- 70 yoF never smoker followed for OSA, Dyspnea improved after treatment for heart failure. Complicated by anxiety and depression, DM2, HBP, Hyperlipidemia, Morbid obesity, Pulmonary Hypertension(PASP 7mmHg by echo 08/1495),WYOVZCH Diastolic CHF,  -Ventolin hfa, Flonase, Melatonin,  CPAP 19/ [employer]   AirSense 10 Auto replaced 2022 Download- compliance 1005. AHI 0.4/ hr Body weight today-  Covid vax-3 Phizer Flu vax-had -----Patient is sleeping good. Patient wants you to listen to her chest/lungs states that her cardiologist heard something the other day. Otherwise no concerns I reviewed her cardiology note referring to coarse breath sounds with no particular concern.  She has been trying to use her rescue inhaler once a day but  really cannot appreciate any significant difference from it and does not note wheeze or cough. Got replacement CPAP machine.  Doing very well.  Download reviewed.  07/05/22- 28 yoF never smoker followed for OSA, Dyspnea improved after treatment for heart failure. Complicated by anxiety and depression, DM2, HBP, Hyperlipidemia, Morbid obesity, Pulmonary Hypertension(PASP 84mmHg by echo 01/8501),DXAJOIN Diastolic CHF,  -Ventolin hfa, Flonase, Melatonin,  CPAP 19/ [employer]   AirSense 10 Auto replaced 2022 Download- compliance 100%, AHI 0.2/hr Body weight today- 186 lbs Covid vax-3 Phizer Flu vax-had -----Pt is doing well, no issues with cpap machine                      husband here She has had to work With Different Masks to Deal with Leak but Is Doing Better Now Using a Advertising account planner and a Occupational psychologist from [employer].  Sleeps Much Better with CPAP.  Had Questions about The Dawna Part but Is Satisfied Not to Look into That more at his ime. Breathing is comfortable CXR 07/04/21- MPRESSION: No radiographic evidence of acute cardiopulmonary process.  ROS-see HPI   + = positive Constitutional:    weight loss, night sweats, fevers, chills, fatigue, lassitude. HEENT:    headaches, difficulty swallowing, tooth/dental problems, sore throat,       sneezing, itching, ear ache, nasal congestion, post nasal drip, snoring CV:    chest pain, orthopnea, PND, swelling in lower extremities, anasarca,                                   dizziness, palpitations Resp:   +shortness of breath with exertion or at rest.                productive cough,  non-productive cough, coughing up of blood.              change in color of mucus.  wheezing.   Skin:    rash or lesions. GI:  No-   heartburn, indigestion, abdominal pain, nausea, vomiting, diarrhea,                 change in bowel habits, loss of appetite GU: dysuria, change in color of urine, no urgency or frequency.   flank pain. MS:  + joint pain, stiffness, decreased  range of motion, back pain. Neuro-     nothing unusual Psych:  change in mood or affect.  depression or anxiety.   memory loss.  OBJ- Physical Exam General- Alert, Oriented, Affect-appropriate, Distress- none acute, + obese Skin- rash-none, lesions- none, excoriation- none Lymphadenopathy- none Head- atraumatic            Eyes- +strabismus            Ears- Hearing, canals-normal            Nose- Clear, no-Septal dev, mucus, polyps, erosion, perforation             Throat- Mallampati II , mucosa clear , drainage- none, tonsils- atrophic Neck- flexible , trachea midline, no stridor , thyroid nl, carotid no bruit Chest - symmetrical excursion , unlabored           Heart/CV- RRR , + trace S murmur , no gallop  , no rub, nl s1 s2                           - JVD- none , edema- none, stasis changes- none, varices- none           Lung- +clear, wheeze- none, cough- none , dullness-none, rub- none           Chest wall-  Abd-  Br/ Gen/ Rectal- Not done, not indicated Extrem- cyanosis- none, clubbing, none, atrophy- none, strength- nl Neuro- grossly intact to observation

## 2022-07-05 ENCOUNTER — Ambulatory Visit (INDEPENDENT_AMBULATORY_CARE_PROVIDER_SITE_OTHER): Payer: Medicare Other | Admitting: Internal Medicine

## 2022-07-05 ENCOUNTER — Encounter: Payer: Self-pay | Admitting: Internal Medicine

## 2022-07-05 VITALS — BP 126/66 | HR 65 | Ht 60.0 in | Wt 186.4 lb

## 2022-07-05 DIAGNOSIS — G4733 Obstructive sleep apnea (adult) (pediatric): Secondary | ICD-10-CM

## 2022-07-05 DIAGNOSIS — E669 Obesity, unspecified: Secondary | ICD-10-CM | POA: Diagnosis not present

## 2022-07-05 NOTE — Assessment & Plan Note (Signed)
I think she is currently too heavy for Inspire but she does not want to pursue that anyway.  Ongoing efforts at weight loss encouraged.

## 2022-07-05 NOTE — Assessment & Plan Note (Signed)
Benefits from CPAP with good compliance and control.  No need to change from fixed pressure.  I encouraged her to let us know if she needs help with mask fit. Plan-continue CPAP 19

## 2022-07-05 NOTE — Patient Instructions (Signed)
You are doing great ! We can continue CPAP 19. Please let us know if you can't get comfortable with the mask.

## 2022-07-11 ENCOUNTER — Ambulatory Visit: Payer: Medicare Other | Admitting: Podiatry

## 2022-07-12 ENCOUNTER — Other Ambulatory Visit: Payer: Self-pay | Admitting: Family Medicine

## 2022-08-14 DIAGNOSIS — H524 Presbyopia: Secondary | ICD-10-CM | POA: Diagnosis not present

## 2022-08-14 DIAGNOSIS — E119 Type 2 diabetes mellitus without complications: Secondary | ICD-10-CM | POA: Diagnosis not present

## 2022-08-14 LAB — HM DIABETES EYE EXAM

## 2022-08-22 ENCOUNTER — Encounter: Payer: Self-pay | Admitting: Podiatry

## 2022-08-22 ENCOUNTER — Ambulatory Visit (INDEPENDENT_AMBULATORY_CARE_PROVIDER_SITE_OTHER): Payer: Medicare Other | Admitting: Podiatry

## 2022-08-22 DIAGNOSIS — M79675 Pain in left toe(s): Secondary | ICD-10-CM

## 2022-08-22 DIAGNOSIS — N1832 Chronic kidney disease, stage 3b: Secondary | ICD-10-CM

## 2022-08-22 DIAGNOSIS — M79674 Pain in right toe(s): Secondary | ICD-10-CM | POA: Diagnosis not present

## 2022-08-22 DIAGNOSIS — B351 Tinea unguium: Secondary | ICD-10-CM

## 2022-08-22 DIAGNOSIS — E1122 Type 2 diabetes mellitus with diabetic chronic kidney disease: Secondary | ICD-10-CM | POA: Diagnosis not present

## 2022-08-22 NOTE — Progress Notes (Signed)
This patient returns to my office for at risk foot care.  This patient requires this care by a professional since this patient will be at risk due to having diabetes mellitus.  This patient is unable to cut nails herself since the patient cannot reach her nails.These nails are painful walking and wearing shoes.  This patient presents for at risk foot care today.  General Appearance  Alert, conversant and in no acute stress.  Vascular  Dorsalis pedis and posterior tibial  pulses are palpable  bilaterally.  Capillary return is within normal limits  bilaterally. Temperature is within normal limits  bilaterally.  Neurologic  Senn-Weinstein monofilament wire test within normal limits  bilaterally. Muscle power within normal limits bilaterally.  Nails Thick disfigured discolored nails with subungual debris  from hallux to fifth toes bilaterally. No evidence of bacterial infection or drainage bilaterally.  Orthopedic  No limitations of motion  feet .  No crepitus or effusions noted.  No bony pathology or digital deformities noted.  HAV  B/L.  Hammer toes  B/L.  Skin  normotropic skin with no porokeratosis noted bilaterally.  No signs of infections or ulcers noted.     Onychomycosis  Pain in right toes  Pain in left toes  Consent was obtained for treatment procedures.   Mechanical debridement of nails 1-5  bilaterally performed with a nail nipper.  Filed with dremel without incident.    Return office visit   3   mos.                  Told patient to return for periodic foot care and evaluation due to potential at risk complications.   Gardiner Barefoot DPM

## 2022-08-24 DIAGNOSIS — N189 Chronic kidney disease, unspecified: Secondary | ICD-10-CM | POA: Diagnosis not present

## 2022-08-24 DIAGNOSIS — I129 Hypertensive chronic kidney disease with stage 1 through stage 4 chronic kidney disease, or unspecified chronic kidney disease: Secondary | ICD-10-CM | POA: Diagnosis not present

## 2022-08-24 DIAGNOSIS — E1122 Type 2 diabetes mellitus with diabetic chronic kidney disease: Secondary | ICD-10-CM | POA: Diagnosis not present

## 2022-08-24 DIAGNOSIS — E785 Hyperlipidemia, unspecified: Secondary | ICD-10-CM | POA: Diagnosis not present

## 2022-08-24 DIAGNOSIS — D631 Anemia in chronic kidney disease: Secondary | ICD-10-CM | POA: Diagnosis not present

## 2022-08-24 DIAGNOSIS — I5032 Chronic diastolic (congestive) heart failure: Secondary | ICD-10-CM | POA: Diagnosis not present

## 2022-08-24 DIAGNOSIS — N1831 Chronic kidney disease, stage 3a: Secondary | ICD-10-CM | POA: Diagnosis not present

## 2022-08-24 DIAGNOSIS — N2581 Secondary hyperparathyroidism of renal origin: Secondary | ICD-10-CM | POA: Diagnosis not present

## 2022-08-31 ENCOUNTER — Encounter: Payer: Self-pay | Admitting: Family Medicine

## 2022-08-31 ENCOUNTER — Other Ambulatory Visit: Payer: Self-pay | Admitting: Family Medicine

## 2022-08-31 ENCOUNTER — Ambulatory Visit (INDEPENDENT_AMBULATORY_CARE_PROVIDER_SITE_OTHER): Payer: Medicare Other | Admitting: Family Medicine

## 2022-08-31 VITALS — BP 128/56 | HR 64 | Temp 97.8°F | Resp 17 | Ht 60.0 in | Wt 191.5 lb

## 2022-08-31 DIAGNOSIS — E1165 Type 2 diabetes mellitus with hyperglycemia: Secondary | ICD-10-CM | POA: Diagnosis not present

## 2022-08-31 DIAGNOSIS — Z794 Long term (current) use of insulin: Secondary | ICD-10-CM

## 2022-08-31 LAB — BASIC METABOLIC PANEL
BUN: 38 mg/dL — ABNORMAL HIGH (ref 6–23)
CO2: 28 mEq/L (ref 19–32)
Calcium: 10.2 mg/dL (ref 8.4–10.5)
Chloride: 103 mEq/L (ref 96–112)
Creatinine, Ser: 1.56 mg/dL — ABNORMAL HIGH (ref 0.40–1.20)
GFR: 33.49 mL/min — ABNORMAL LOW (ref >60.00)
Glucose, Bld: 155 mg/dL — ABNORMAL HIGH (ref 70–99)
Potassium: 4.7 mEq/L (ref 3.5–5.1)
Sodium: 139 mEq/L (ref 135–145)

## 2022-08-31 LAB — HEMOGLOBIN A1C: Hgb A1c MFr Bld: 7.8 % — ABNORMAL HIGH (ref 4.6–6.5)

## 2022-08-31 MED ORDER — FREESTYLE LITE W/DEVICE KIT: PACK | 0 refills | Status: DC

## 2022-08-31 NOTE — Patient Instructions (Signed)
Follow up in 3-4 months to recheck diabetes, BP, cholesterol We'll notify you of your lab results and make any changes if needed No med changes at this time I put a pharmacy referral in to help w/ med assistance Call with any questions or concerns Stay Safe!  Stay Healthy! Happy Spring!

## 2022-08-31 NOTE — Progress Notes (Signed)
   Subjective:    Patient ID: Kayla Brown, female    DOB: 12-05-1950, 72 y.o.   MRN: [id number]  HPI DM- chronic problem, on Glipizide 10mg  daily, Farxiga 10mg , Lantus 10 units daily, Ozempic 1mg  weekly.  UTD on eye exam, foot exam, microalbumin.  Pt reports feeling good.  Has had a few lows but she feels that things are 'leveling out'.  No CP, SOB, HA's, visual changes, abd pain, N/V.  No numbness/tingling of hands/feet.  Needs pt assistance for Ozempic.   Review of Systems For ROS see HPI     Objective:   Physical Exam Vitals reviewed.  Constitutional:      General: She is not in acute distress.    Appearance: Normal appearance. She is well-developed. She is obese.  HENT:     Head: Normocephalic and atraumatic.  Eyes:     Conjunctiva/sclera: Conjunctivae normal.  Neck:     Thyroid: No thyromegaly.  Cardiovascular:     Rate and Rhythm: Normal rate and regular rhythm.     Pulses: Normal pulses.     Heart sounds: Normal heart sounds. No murmur heard. Pulmonary:     Effort: Pulmonary effort is normal. No respiratory distress.     Breath sounds: Normal breath sounds.  Abdominal:     General: There is no distension.     Palpations: Abdomen is soft.     Tenderness: There is no abdominal tenderness.  Musculoskeletal:     Cervical back: Normal range of motion and neck supple.     Right lower leg: No edema.     Left lower leg: No edema.  Lymphadenopathy:     Cervical: No cervical adenopathy.  Skin:    General: Skin is warm and dry.  Neurological:     Mental Status: She is alert and oriented to person, place, and time.  Psychiatric:        Behavior: Behavior normal.           Assessment & Plan:

## 2022-08-31 NOTE — Assessment & Plan Note (Signed)
Chronic problem for pt.  Currently on Glipizide '10mg'$  daily, Farxiga '10mg'$  daily, Lantus 10 units nightly, and Ozempic '1mg'$  weekly.  She is currently asymptomatic.  She needs pharmacy help w/ patient assistance programs to get her medication.  Referral placed.  Check A1C and adjust meds prn.

## 2022-09-03 ENCOUNTER — Telehealth: Payer: Self-pay

## 2022-09-03 NOTE — Progress Notes (Signed)
error 

## 2022-09-03 NOTE — Progress Notes (Signed)
   Care Guide Note  09/03/2022 Name: DOMNIQUE VANEGAS MRN: [id number] DOB: 14-Nov-1950  Referred by: [name], MD Reason for referral : Care Coordination (Outreach to schedule referral )   SAMIAH RICKLEFS is a 72 y.o. year old female who is a primary care patient of Birdie Riddle, [name], MD. [name] was referred to the pharmacist for assistance related to DM.    Successful contact was made with the patient to discuss pharmacy services including being ready for the pharmacist to call at least 5 minutes before the scheduled appointment time, to have medication bottles and any blood sugar or blood pressure readings ready for review. The patient agreed to meet with the pharmacist via with the pharmacist via telephone visit on (date/time).  09/04/2022  Noreene Larsson, [place], [place] [address] Direct Dial: [phone] [email or web address]

## 2022-09-03 NOTE — Telephone Encounter (Signed)
-----   Message from [name], MD sent at 09/03/2022  7:38 AM EDT ----- Kidney function (Creatinine and GFR) are stable.  A1C is up a little to 7.8%.  This will improve w/ low carb/low sugar diet and regular physical activity.  No med changes at this time

## 2022-09-03 NOTE — Telephone Encounter (Signed)
Informed pt of lab results  

## 2022-09-04 ENCOUNTER — Encounter: Payer: Self-pay | Admitting: Pharmacist

## 2022-09-04 ENCOUNTER — Telehealth: Payer: Self-pay | Admitting: Pharmacist

## 2022-09-04 ENCOUNTER — Other Ambulatory Visit (INDEPENDENT_AMBULATORY_CARE_PROVIDER_SITE_OTHER): Payer: Medicare Other | Admitting: Pharmacist

## 2022-09-04 DIAGNOSIS — Z794 Long term (current) use of insulin: Secondary | ICD-10-CM

## 2022-09-04 DIAGNOSIS — E1165 Type 2 diabetes mellitus with hyperglycemia: Secondary | ICD-10-CM

## 2022-09-04 NOTE — Progress Notes (Unsigned)
09/04/2022 Name: Kayla Brown MRN: [id number] DOB: [birth year]  Chief Complaint  Patient presents with   Medication Management    initial   Diabetes    initial    Kayla Brown is a 72 y.o. year old female who presented for a telephone visit.   They were referred to the pharmacist by their PCP for assistance in managing diabetes and medication access.    Subjective:  Care Team: Primary Care Provider: [name], MD ; Next Scheduled Visit: 12/05/2022 Cardiologist: Peter [name], MD; Next Scheduled Visit: 09/06/2022 Podiatrist: Dr Gardiner Barefoot; Next Appointment: 11/23/2022 Nephrologist: Dr Hollie Salk; Next Appointment: 12/2022  Medication Access/Adherence  [id number] [place]: [phone]: [address] [id number] [place]: [phone]: [phone]  Patient reports affordability concerns with their medications: Yes  Patient reports access/transportation concerns to their pharmacy: No  Patient reports adherence concerns with their medications:  No     [place] she has about 6 weeks of medication on had She [facility] - She states her nephrologist told her that there would be an alternative available that would be covered by her Medicare for 2024. Patient was not sure what that other medication was. Patient states she got a extra delivery of Farxiga in 2023 and states she has about 9 bottles on hand. She will see Dr UHollie Salkin July 2024.   Diabetes: Current medications:  Ozempic '1mg'$  weekly. Lantus 10 units daily, Farxiga '10mg'$  daily, glipizide '10mg'$  twice a day Medications tried in the past: Tradgenta - stopped when changed to Ozempic  Current glucose  readings: 150's Requesting a new glucometer - does not feel that her current meter is working properly. Last received a new meter over 2 year ago.  She is testing 2 times daily.  Dr. TBirdie Riddlehas sent in new Rx but looks like it has been hung up at the pharmacy. Might need additional information.    Objective:  Lab Results  Component Value Date   HGBA1C 7.8 (H) 08/31/2022    Lab Results  Component Value Date   CREATININE 1.56 (H) 08/31/2022   BUN 38 (H) 08/31/2022   NA 139 08/31/2022   K 4.7 08/31/2022   CL 103 08/31/2022   CO2 28 08/31/2022    Lab Results  Component Value Date   CHOL 125 04/30/2022   HDL 60.90 04/30/2022   LDLCALC 52 04/30/2022   LDLDIRECT 50.0 05/23/2020   TRIG 59.0 04/30/2022   CHOLHDL 2 04/30/2022    Medications Reviewed Today     Reviewed by TMidge Minium MD (Physician) on 08/31/22 at 0[place] Status: <None>   Medication Order Taking? Sig Documenting Provider Last Dose Status Informant  albuterol (VENTOLIN HFA) 108 (90 Base) MCG/ACT inhaler [id number] Inhale 1-2 puffs into the lungs every 6 (six) hours as needed for wheezing or shortness of breath. [provider] Taking Active   Alcohol Swabs (ALCOHOL PREP) PADS [id number] Pt uses an alcohol pad each time sugars are tested. Pt tests twice daily. Dx E11.9 TMidge Minium MD Taking Active Spouse/Significant Other  amLODipine (NORVASC) 10 MG tablet [id number] TAKE 1 TABLET BY MOUTH EVERY DAY TMidge Minium MD Taking Active  atorvastatin (LIPITOR) 40 MG tablet [id number] Yes TAKE 1 TABLET BY MOUTH EVERY DAY [name], MD Taking Active   B Complex Vitamins (B COMPLEX PO) [id number] Yes Take 1 tablet by mouth daily. [provider] Taking Active   buPROPion (WELLBUTRIN XL) 300 MG 24 hr tablet [id number] Yes Take 1 tablet by mouth once daily [name], MD Taking Active   Calcium Carb-Cholecalciferol [phone] MG-UNIT CAPS [id number] Yes Take 1  tablet by mouth 2 (two) times daily. [provider] Taking Active   carvedilol (COREG) 25 MG tablet [id number] Yes TAKE 1 TABLET BY MOUTH TWICE A DAY [name], [name], MD Taking Active   dapagliflozin propanediol (FARXIGA) 10 MG TABS tablet [id number] Yes Take 10 mg by mouth daily. [provider] Taking Active Self           Med Note Leroy Sea, JACOB   Wed Jan 25, 2021  9:18 AM) Gets through patient assistance.   ferrous sulfate 324 MG TBEC [id number] Yes Take 324 mg by mouth. [provider] Taking Active   fish oil-omega-3 fatty acids 1000 MG capsule [id number] Yes Take 1 g by mouth daily. [provider] Taking Active Spouse/Significant Other  fluticasone (FLONASE) 50 MCG/ACT nasal spray [id number] Yes SPRAY 2 SPRAYS INTO EACH NOSTRIL EVERY DAY  Patient taking differently: Place 2 sprays into both nostrils daily.   [name], MD Taking Active   furosemide (LASIX) 20 MG tablet [id number] Yes TAKE 1 TABLET BY MOUTH EVERY DAY [name], MD Taking Active   glipiZIDE (GLUCOTROL XL) 10 MG 24 hr tablet [id number] Yes TAKE 1 TABLET BY MOUTH 2 (TWO) TIMES DAILY WITH A MEAL. [name], MD Taking Active   glucose blood (FREESTYLE LITE) test strip [id number] Yes USE TO TEST BLOOD GLUCOSE 2 TIMES DAILY. Dx E11.9 [name], MD Taking Active Spouse/Significant Other  insulin glargine (LANTUS SOLOSTAR) 100 UNIT/ML Solostar Pen [id number] Yes Inject 10 Units into the skin daily. [name], MD Taking Active   Insulin Pen Needle (PEN NEEDLES) 32G X 4 MM MISC [id number] Yes 1 Application by Does not apply route daily. [name], MD Taking Active   loratadine (CLARITIN) 10 MG tablet [id number] Yes TAKE 1 TABLET BY MOUTH EVERY DAY  Patient taking differently: Take 10 mg by mouth daily.   [name], MD Taking Active   Melatonin 3 MG TABS [id number] Yes Take 10 mg by mouth at bedtime.  [provider] Taking Active  Spouse/Significant Other  methocarbamol (ROBAXIN) 500 MG tablet [id number] Yes Take by mouth. [provider] Taking Active   Multiple Vitamins-Minerals (VISION FORMULA) TABS [id number] Yes Take 1 tablet by mouth daily. [provider] Taking Active Spouse/Significant Other  OVER THE COUNTER MEDICATION [id number] Yes Take 1 tablet by mouth daily. Equate Vision Formula 50+ [provider] Taking Active Spouse/Significant Other  Semaglutide, 1 MG/DOSE, 4 MG/3ML SOPN [id number] Yes Inject 1 mg as directed once a week. [name], MD Taking Active   sertraline (ZOLOFT) 50 MG tablet [id number] Yes Take 1 tablet (50 mg total) by mouth daily. Wendie Agreste, MD Taking Active   traMADol Veatrice Bourbon) 50 MG tablet [id number] Yes Take 1 tablet by mouth every 6 (six) hours as needed. [provider] Taking Active   TRUEPLUS LANCETS 30G [place] [id number] Yes Use one lancet each time sugars are tested. Pt tests twice daily. Dx. E11.9 [name], MD Taking Active Spouse/Significant Other  UNABLE TO FIND  [id number] No Med Name: Healthwise talking meter  Patient not taking: Reported on 08/31/2022   Shamleffer, Melanie Crazier, MD Not Taking Active             SDOH Interventions Today    Flowsheet Row Most Recent Value  SDOH Interventions   Financial Strain Interventions Other (Comment)  [assisting with application for MAP for Ozempic]       Assessment/Plan:   Diabetes: Currently not at A1c goal.  - Reviewed goal A1c, goal fasting, and goal 2 hour post prandial glucose - Discussed medication assistance program program for Ozmepic. Will have Rx assistance team mail out application to patient.  - Will check back with patient after she sees Dr Hollie Salk in July to see if she need medication assistance program for Farxiga.  - Recommend to check glucose twice a day. Will forward Rx for PCP to [facility] Freestyle meter, test strip and lancets.  Needed to update diagnosis code to T2DM with long term insulin use (E11.65, Z79.4)   Medication Management: - Reviewed medication list and updated.  - Reviewed refill history and adherence - patient has filled maintenance meds on times so far in 2024.  - Reviewed patients Part D plan with her: She has [place] [phone]). Has a '@280'$  deductible to meet for 2024 (tier 1 meds are exclulded)   Tier 1 meds - amlodipine, atorvastatin carvedilol, furosemide and sertraline - $0 copay  Tier 2 meds - bupropion XL '300mg'$ , glipizide XL '10mg'$  - $5 / 30 days and $15 / 90 days  Tier 3 meds - Farxiga, Ozempic - patient pays 24% of med cost (about $150 to $200 / month)    Follow Up Plan: 1 month to check on patient assistance program and adherence  Cherre Robins, [facility]

## 2022-09-04 NOTE — Telephone Encounter (Signed)
Patient received medication assistance program thru [employer] for [facility] in 2023. She has about 6 weeks of Ozempic left and needs assistance with completing [id number] application.  She is taking Ozempic '1mg'$  weekly. Reviewed for appropriateness.  Forwarding request to Rx Assistance team for mail patient application.

## 2022-09-05 ENCOUNTER — Other Ambulatory Visit (HOSPITAL_COMMUNITY): Payer: Self-pay

## 2022-09-05 MED ORDER — FREESTYLE LITE W/DEVICE KIT: PACK | 0 refills | Status: AC

## 2022-09-05 MED ORDER — FREESTYLE LITE TEST VI STRP: ORAL_STRIP | 12 refills | Status: AC

## 2022-09-05 MED ORDER — FREESTYLE LANCETS MISC: 12 refills | Status: AC

## 2022-09-05 NOTE — Telephone Encounter (Signed)
PAP application for OZEMPIC/NOVO [place]  has been mailed to pt home. I will fax PCP pages once I receive pt pages.  Sandre Kitty Rx Patient Advocate [phone]

## 2022-09-05 NOTE — Telephone Encounter (Signed)
.  cpht

## 2022-09-05 NOTE — Progress Notes (Signed)
Cardiology Office Note   Date:  09/06/2022   ID:  Kayla Brown, DOB 10/10/1950, MRN [id number]  PCP:  Sheliah Hatch, MD  Cardiologist:   Kayla Foisy [place], MD   Chief Complaint  Patient presents with   Congestive Heart Failure       History of Present Illness: Kayla Brown is a 72 y.o. female who is seen for follow up dyspnea. She has a history of OSA.  She has a history of mild pulmonary HTN based on Echo in 2016 and 2017 but not replicated on Echo in 2019 and 2020. Myoview in August 2020 was normal. She has  HTN and dyslipidemia. Prior pulmonary work up includes PFT 04/04/15   - mild Diffusion defect, normal flows and volumes without response to bronchodilator. CT chest March 05, 2019 extensive atelectatic changes throughout the lungs with a dependent predominance, mosaic attenuation throughout the lungs, emphysema. VQ scan 03/04/2019 no unmatched segmental perfusion defects to suggest acute PE. Autoimmune work-up negative 2020. She had a 6-minute walk test that showed 267 m completed.  With no desaturations. Repeat PFTs show no airflow obstruction or restriction.  FEV1 102%, ratio 86, FVC 90% normal mid flows,, DLCO 90%.  She was admitted 9/8-9/21/20 with acute hypoxic respiratory failure. Patient developed  increased oxygen requirement on 9/10, at 8 L nasal cannula, PCCM consulted, work-up significant for atelectasis and ? pulmonary edema. BNP level was normal.   Patient improved with IV diuresis, and pulmonary PT, but unfortunately developed acute kidney injury due to diuresis. ACEi, hygroton, spironolactone  were discontinued. CT, Echo,  and CXR findings as noted below. She diuresed 20 lbs in the hospital.  She had follow up in our office in June and October 2023 and was stable.   On follow up today she is doing well. Seen with her husband. Notes her sugars haven't been as good and she is not eating "the best". Is now on insulin. Has gained about 18 lbs. No significant edema  or SOB. Trying to better with diet. She is watching her salt intake. Denies any chest pain or palpitations.    Past Medical History:  Diagnosis Date   Anxiety    Congestive heart failure (CHF) (HCC)    Diabetes mellitus    Edema of extremities 03/02/2016   Essential hypertension, benign    Lazy eye    Major depressive disorder, single episode, unspecified    Obesity (BMI 30-39.9) 03/01/2016   OSA (obstructive sleep apnea)    Pulmonary HTN (HCC)    mild with PASP by echo 05/2016   Pure hypercholesterolemia    Retinopathy, due to hypertension     Past Surgical History:  Procedure Laterality Date   CPAP TITRATION  10/14/2015   DILATION AND CURETTAGE OF UTERUS  2005-2008   x2   EYE SURGERY     cataracts   WISDOM TOOTH EXTRACTION       Current Outpatient Medications  Medication Sig Dispense Refill   albuterol (VENTOLIN HFA) 108 (90 Base) MCG/ACT inhaler Inhale 1-2 puffs into the lungs every 6 (six) hours as needed for wheezing or shortness of breath.     Alcohol Swabs (ALCOHOL PREP) PADS Pt uses an alcohol pad each time sugars are tested. Pt tests twice daily. Dx E11.9 100 each 3   amLODipine (NORVASC) 10 MG tablet TAKE 1 TABLET BY MOUTH EVERY DAY 90 tablet 1   atorvastatin (LIPITOR) 40 MG tablet TAKE 1 TABLET BY MOUTH EVERY DAY 90 tablet  1   B Complex Vitamins (B COMPLEX PO) Take 1 tablet by mouth daily.     Blood Glucose Monitoring Suppl (FREESTYLE LITE) w/Device KIT Use to check blood glucose up to twice a day (Dx - type 2 DM with long term insulin use - E11.65, Z79.4) 1 kit 0   buPROPion (WELLBUTRIN XL) 300 MG 24 hr tablet Take 1 tablet by mouth once daily 90 tablet 0   Calcium Carb-Cholecalciferol [phone] MG-UNIT CAPS Take 1 tablet by mouth 2 (two) times daily.     carvedilol (COREG) 25 MG tablet TAKE 1 TABLET BY MOUTH TWICE A DAY 180 tablet 3   dapagliflozin propanediol (FARXIGA) 10 MG TABS tablet Take 10 mg by mouth daily.     ferrous sulfate 324 MG TBEC Take 324  mg by mouth.     fish oil-omega-3 fatty acids 1000 MG capsule Take 1 g by mouth daily.     fluticasone (FLONASE) 50 MCG/ACT nasal spray SPRAY 2 SPRAYS INTO EACH NOSTRIL EVERY DAY (Patient taking differently: Place 2 sprays into both nostrils daily.) 18 g 1   furosemide (LASIX) 20 MG tablet TAKE 1 TABLET BY MOUTH EVERY DAY 90 tablet 1   glipiZIDE (GLUCOTROL XL) 10 MG 24 hr tablet TAKE 1 TABLET BY MOUTH 2 (TWO) TIMES DAILY WITH A MEAL. 180 tablet 1   glucose blood (FREESTYLE LITE) test strip Use to check blood glucose up to twice a day (Dx - type 2 DM with long term insulin use - E11.65, Z79.4) 100 each 12   insulin glargine (LANTUS SOLOSTAR) 100 UNIT/ML Solostar Pen Inject 10 Units into the skin daily. 15 mL 1   Insulin Pen Needle (PEN NEEDLES) 32G X 4 MM MISC 1 Application by Does not apply route daily. 30 each 2   Lancets (FREESTYLE) lancets Use to check blood glucose up to twice a day (Dx - type 2 DM with long term insulin use - E11.65, Z79.4) 100 each 12   loratadine (CLARITIN) 10 MG tablet TAKE 1 TABLET BY MOUTH EVERY DAY (Patient taking differently: Take 10 mg by mouth daily.) 30 tablet 11   Magnesium Oxide 420 MG TABS Take by mouth.     Melatonin 3 MG TABS Take 10 mg by mouth at bedtime.      methocarbamol (ROBAXIN) 500 MG tablet Take by mouth.     Multiple Vitamins-Minerals (VISION FORMULA) TABS Take 1 tablet by mouth daily.     OVER THE COUNTER MEDICATION Take 1 tablet by mouth daily. Equate Vision Formula 50+     Semaglutide, 1 MG/DOSE, 4 MG/3ML SOPN Inject 1 mg as directed once a week. 9 mL 0   sertraline (ZOLOFT) 50 MG tablet Take 1 tablet (50 mg total) by mouth daily. 90 tablet 1   traMADol (ULTRAM) 50 MG tablet Take 1 tablet by mouth every 6 (six) hours as needed.     No current facility-administered medications for this visit.    Allergies:   Patient has no known allergies.    Social History:  The patient  reports that she has never smoked. She has never used smokeless tobacco.  She reports that she does not drink alcohol and does not use drugs.   Family History:  The patient's family history includes ADD / ADHD in her mother; Alcohol abuse in her mother; Dementia in her mother. She was adopted.    ROS:  Please see the history of present illness.   Otherwise, review of systems are positive for none.  All other systems are reviewed and negative.    PHYSICAL EXAM: VS:  BP 114/71   Pulse 64   Ht 5' (1.524 m)   Wt 192 lb 3.2 oz (87.2 kg)   LMP 06/25/2008   SpO2 96%   BMI 37.54 kg/m  , BMI Body mass index is 37.54 kg/m. GEN: Well nourished, obese, in no acute distress  HEENT: normal  Neck: no JVD, carotid bruits, or masses Cardiac: RRR; no murmurs, rubs, or gallops,no edema  Respiratory:  clear to auscultation bilaterally, normal work of breathing GI: soft, nontender, nondistended, + BS MS: no deformity or atrophy  Skin: warm and dry, no rash Neuro:  Strength and sensation are intact Psych: euthymic mood, full affect   EKG:  EKG is not ordered today.    Recent Labs: 04/30/2022: ALT 39; Hemoglobin 13.5; Platelets 198.0; TSH 1.51 08/31/2022: BUN 38; Creatinine, Ser 1.56; Potassium 4.7; Sodium 139    Lipid Panel    Component Value Date/Time   CHOL 125 04/30/2022 0827   TRIG 59.0 04/30/2022 0827   HDL 60.90 04/30/2022 0827   CHOLHDL 2 04/30/2022 0827   VLDL 11.8 04/30/2022 0827   LDLCALC 52 04/30/2022 0827   LDLCALC 52 11/18/2020 1436   LDLDIRECT 50.0 05/23/2020 1431      Wt Readings from Last 3 Encounters:  09/06/22 192 lb 3.2 oz (87.2 kg)  08/31/22 191 lb 8 oz (86.9 kg)  07/05/22 186 lb 6.4 oz (84.6 kg)      Other studies Reviewed: Additional studies/ records that were reviewed today include:  Myoview 02/02/19: Study Highlights    Nuclear stress EF: 54%. There was no ST segment deviation noted during stress. This is a low risk study. The left ventricular ejection fraction is normal (55-65%). The study is normal.   Low risk stress  nuclear study with normal perfusion and normal left ventricular regional and global systolic function.       Echo 03/06/19: IMPRESSIONS      1. The left ventricle has hyperdynamic systolic function, with an ejection fraction of >65%. The cavity size was normal. There is moderate concentric left ventricular hypertrophy. Left ventricular diastolic Doppler parameters are consistent with  impaired relaxation. Elevated mean left atrial pressure No evidence of left ventricular regional wall motion abnormalities.  2. The right ventricle has normal systolic function. The cavity was normal. There is no increase in right ventricular wall thickness. Right ventricular systolic pressure could not be assessed.  3. Left atrial size was mildly dilated.  4. Trivial pericardial effusion is present.  5. The aorta is normal unless otherwise noted.   PORTABLE CHEST 1 VIEW   COMPARISON:  Chest radiograph dated 03/05/2019   FINDINGS: Chronic interstitial coarsening and mild bronchitic changes. No focal consolidation, pleural effusion, or pneumothorax. Linear density in the left mid lung field, likely atelectatic changes. The cardiac silhouette is within normal limits. No acute osseous pathology.   IMPRESSION: No active disease.     Electronically Signed   By: Elgie Collard M.D.   On: 03/11/2019 10:21  CT CHEST WITHOUT CONTRAST   TECHNIQUE: Multidetector CT imaging of the chest was performed following the standard protocol without IV contrast.   COMPARISON:  CT 04/05/2015   FINDINGS: Cardiovascular: Normal heart size. No pericardial effusion. Atherosclerotic calcification of the aortic leaflets is noted. There is mitral annular calcification is well. The aorta is normal caliber with atheromatous plaque. Additional calcifications are present in the proximal great vessels and upper abdominal aorta. Major venous  structures are unremarkable.   Mediastinum/Nodes: Numerous though nonenlarged  mediastinal nodes are present. Evaluation for hilar adenopathy is limited in the absence of contrast. No axillary adenopathy. Thyroid gland and thoracic inlet are unremarkable. Bowing of the posterior trachea 0 likely related to imaging during exhalation. There is mild airways thickening. The esophagus is unremarkable.   Lungs/Pleura: Redemonstration of mosaic attenuation throughout the lungs superimposed on centrilobular and paraseptal emphysema. There is increasing bandlike areas of subsegmental atelectasis and/or scarring as well as additional atelectatic change posteriorly in the lung bases. No pneumothorax. No effusion. Stable subpleural area of scarring is seen in the right middle lobe.   Upper Abdomen: Small accessory splenule. Mild bilateral symmetric perinephric stranding, a nonspecific finding though may correlate with either age or decreased renal function. Portion of the hepatic flexure is interposed anterior to the liver. Fatty replacement of the pancreas. No pancreatic ductal dilatation or surrounding inflammatory changes. No acute abnormalities present in the visualized portions of the upper abdomen.   Musculoskeletal: Multilevel degenerative changes are present in the imaged portions of the spine. No acute osseous abnormality or suspicious osseous lesion.   IMPRESSION: 1. Extensive atelectatic changes are present throughout the lungs with a dependent predominance. 2. Redemonstration of mosaic attenuation throughout the lungs superimposed on centrilobular and paraseptal emphysema, suggestive of small airways disease. 3. Mild bilateral symmetric perinephric stranding, a nonspecific finding though may correlate with either age or decreased renal function. 4. Aortic Atherosclerosis (ICD10-I70.0) and Emphysema (ICD10-J43.9).     Electronically Signed   By: Kreg Shropshire M.D.   On: 03/05/2019 22:41  ASSESSMENT AND PLAN:  1.  Chronic diastolic CHF. Admitted in  September 2020 with acute respiratory failure. Given clinical response this appears to be predominantly acute on chronic diastolic CHF.  BNP low but may be masked by obesity. Echo with moderate LVH but normal systolic function and no evidence of pulmonary HTN. She is clinically doing well.  Continue sodium restriction. Continue current lasix dose. I think her weight gain is more related to too many calories and the fact she is on insulin.   2.  Hypertension -BP is well controlled now. Continue Coreg and amlodipine.   3.  Morbid Obesity  -I have encouraged weight loss.   4.  OSA on CPAP. followed by pulmonary now.   5. CKD stage 3. Stable. Followed by Neprhology.   Current medicines are reviewed at length with the patient today.  The patient does not have concerns regarding medicines.  The following changes have been made:  See above.  Labs/ tests ordered today include:  No orders of the defined types were placed in this encounter.     Disposition:   FU with APP in 6  months  Signed, Zeric Baranowski [place], MD  09/06/2022 8:15 AM    [facility] [address] [place], [place], [address] Phone [phone], Fax [phone]

## 2022-09-06 ENCOUNTER — Encounter: Payer: Self-pay | Admitting: Cardiology

## 2022-09-06 ENCOUNTER — Ambulatory Visit: Payer: Medicare Other | Attending: Cardiology | Admitting: Cardiology

## 2022-09-06 VITALS — BP 114/71 | HR 64 | Ht 60.0 in | Wt 192.2 lb

## 2022-09-06 DIAGNOSIS — I5032 Chronic diastolic (congestive) heart failure: Secondary | ICD-10-CM | POA: Diagnosis not present

## 2022-09-06 DIAGNOSIS — E1165 Type 2 diabetes mellitus with hyperglycemia: Secondary | ICD-10-CM | POA: Insufficient documentation

## 2022-09-06 DIAGNOSIS — G4733 Obstructive sleep apnea (adult) (pediatric): Secondary | ICD-10-CM | POA: Diagnosis not present

## 2022-09-06 DIAGNOSIS — Z794 Long term (current) use of insulin: Secondary | ICD-10-CM | POA: Insufficient documentation

## 2022-09-06 DIAGNOSIS — I10 Essential (primary) hypertension: Secondary | ICD-10-CM | POA: Insufficient documentation

## 2022-09-18 ENCOUNTER — Other Ambulatory Visit: Payer: Self-pay | Admitting: Family Medicine

## 2022-09-18 DIAGNOSIS — F32A Anxiety disorder, unspecified: Secondary | ICD-10-CM

## 2022-09-19 ENCOUNTER — Telehealth: Payer: Self-pay

## 2022-09-19 ENCOUNTER — Other Ambulatory Visit (HOSPITAL_COMMUNITY): Payer: Self-pay

## 2022-09-19 NOTE — Telephone Encounter (Addendum)
RECEIVED PT pages for Ozempic([employer]) faxed provider pages to [name], MD office [phone] please have provider to review, sign and fax to [phone] ATTENTION Dominyk Law L.   Sandre [facility] Patient Advocate [phone]

## 2022-09-19 NOTE — Telephone Encounter (Signed)
[employer] patient assistance forms have been placed in Dr Birdie Riddle to be signed folder .

## 2022-09-25 NOTE — Telephone Encounter (Signed)
Forms faxed and placed in scan  

## 2022-09-25 NOTE — Telephone Encounter (Signed)
Forms signed and returned to [facility] 

## 2022-10-03 NOTE — Telephone Encounter (Signed)
Yes they where faxed back to [employer] and copy was placed in scan to be scanned to pt chart as well

## 2022-10-03 NOTE — Telephone Encounter (Signed)
Yes they where faxed back to [facility]

## 2022-10-03 NOTE — Telephone Encounter (Signed)
Submitted PT PAGES OF  application for OZEMPIC to [employer] for patient assistance.   PROVIDER PORTION WAS ALREADY SENT TO COMPANY  Phone: [phone]   PLEASE BE ADVISED  Melanee Spry CPhT Rx Patient Advocate [phone]

## 2022-10-03 NOTE — Telephone Encounter (Signed)
I see that you have receive the provider pages I faxed on 09/19/2022 was the pages sent back to the number that was on the fax or was it faxed to the company [employer]?  Georga Bora Rx Patient Advocate [phone]

## 2022-10-05 ENCOUNTER — Other Ambulatory Visit: Payer: Self-pay | Admitting: Family Medicine

## 2022-10-05 ENCOUNTER — Other Ambulatory Visit (INDEPENDENT_AMBULATORY_CARE_PROVIDER_SITE_OTHER): Payer: Medicare Other | Admitting: Pharmacist

## 2022-10-05 ENCOUNTER — Encounter: Payer: Self-pay | Admitting: Pharmacist

## 2022-10-05 ENCOUNTER — Telehealth: Payer: Self-pay | Admitting: Pharmacist

## 2022-10-05 DIAGNOSIS — Z794 Long term (current) use of insulin: Secondary | ICD-10-CM

## 2022-10-05 DIAGNOSIS — E1165 Type 2 diabetes mellitus with hyperglycemia: Secondary | ICD-10-CM

## 2022-10-05 NOTE — Telephone Encounter (Signed)
[employer] to check on medication assistance program application for [employer].  [employer] representation states that they are missing the following information:  Need copay of front and back of prescription insurance card (Silver Script card)  They also need clarification on Ozempic prescription page. Directions were incorrect.

## 2022-10-05 NOTE — Progress Notes (Unsigned)
10/05/2022 Name: Kayla Brown MRN: [id number] DOB: 02-10-1951  Chief Complaint  Patient presents with   Diabetes    Kayla Brown is a 72 y.o. year old female who presented for a telephone visit.   They were referred to the pharmacist by their PCP for assistance in managing diabetes and medication access.    Subjective:  Care Team: Primary Care Provider: Sheliah Hatch, MD ; Next Scheduled Visit: 12/05/2022 Cardiologist: Peter [place], MD; Next Scheduled Visit: 09/06/2022 Podiatrist: Dr Helane Gunther; Next Appointment: 11/23/2022 Nephrologist: Dr Signe Colt; Next Appointment: 12/2022  Medication Access/Adherence  Current Pharmacy:  [id number] [place], [place] - [address] [place] [address] Phone: [phone] Fax: [phone]  [facility] [address] [place] - [address] [place] [address] Phone: [phone] Fax: [phone]   Patient reports affordability concerns with their medications: Yes  Patient reports access/transportation concerns to their pharmacy: No  Patient reports adherence concerns with their medications:  No     Patient received Ozempic form [employer] at the end of 2023 - she has about 4 weeks of medication on hand. We have submitted 2024 application to [employer] (sent by [employer] 10/03/2022). I called to check on app and it appears that they need additional information and clarification for prescription portion.   She reports today that she asked for a refill for Lantus and cost was $92 for 1 box or 5 pens which is 150 day supply. Her husband reports she only paid $30 for 90 days last year.   She also received Marcelline Deist from [facility] in 2023 - She states her nephrologist told her that there would be an alternative available that would be covered by her Medicare for 2024. Patient was not sure what that other medication was. Patient states  she got a extra delivery of Farxiga in 2023 and states she has about 9 bottles on hand. She will see Dr Signe Colt in July 2024.   Diabetes: Current medications:  Ozempic  weekly. Lantus 10 units daily, Farxiga  daily, glipizide  twice a day Medications tried in the past: Tradjenta - stopped when changed to Ozempic  Current glucose readings: 58 to 180. Blood glucose usually 140 to 150 in morning.  She has blood glucose < 70 about 2 times per month. Reports she can tell when blood glucose drops. She is checking blood glucose 2 times a day.  Using new glucometer - Freestyle Lite.    Objective:  Lab Results  Component Value Date   HGBA1C 7.8 (H) 08/31/2022    Lab Results  Component Value Date   CREATININE 1.56 (H) 08/31/2022   BUN 38 (H) 08/31/2022   NA 139 08/31/2022   K 4.7 08/31/2022   CL 103 08/31/2022   CO2 28 08/31/2022    Lab Results  Component Value Date   CHOL 125 04/30/2022   HDL 60.90 04/30/2022   LDLCALC 52 04/30/2022   LDLDIRECT 50.0 05/23/2020   TRIG 59.0 04/30/2022   CHOLHDL 2 04/30/2022    Medications Reviewed Today     Reviewed by Henrene Pastor, RPH-CPP (Pharmacist) on 10/05/22 at 1136  Med List Status: <None>   Medication Order Taking? Sig Documenting Provider Last Dose Status Informant  albuterol (VENTOLIN HFA) 108 (90 Base) MCG/ACT inhaler [id number] Yes Inhale 1-2 puffs into the lungs every 6 (six) hours as needed for wheezing or shortness of breath. [provider] Taking Active  Alcohol Swabs (ALCOHOL PREP) PADS [id number] Yes Pt uses an alcohol pad each time sugars are tested. Pt tests twice daily. Dx E11.9 Sheliah Hatch, MD Taking Active Spouse/Significant Other  amLODipine (NORVASC) 10 MG tablet [id number] Yes TAKE 1 TABLET BY MOUTH EVERY DAY Sheliah Hatch, MD Taking Active   atorvastatin (LIPITOR) 40 MG tablet [id number] Yes TAKE 1 TABLET BY MOUTH EVERY DAY Sheliah Hatch, MD Taking Active   B Complex Vitamins (B  COMPLEX PO) [id number] Yes Take 1 tablet by mouth daily. [provider] Taking Active   Blood Glucose Monitoring Suppl (FREESTYLE LITE) w/Device KIT [id number] Yes Use to check blood glucose up to twice a day (Dx - type 2 DM with long term insulin use - E11.65, Z79.4) Sheliah Hatch, MD Taking Active   buPROPion (WELLBUTRIN XL) 300 MG 24 hr tablet [id number] Yes Take 1 tablet by mouth once daily Sheliah Hatch, MD Taking Active   Calcium Carb-Cholecalciferol [id number] MG-UNIT CAPS [id number] Yes Take 1 tablet by mouth 2 (two) times daily. [provider] Taking Active   carvedilol (COREG) 25 MG tablet [id number] Yes TAKE 1 TABLET BY MOUTH TWICE A DAY [place], [name], MD Taking Active   dapagliflozin propanediol (FARXIGA) 10 MG TABS tablet [id number] Yes Take 10 mg by mouth daily. [provider] Taking Active Self           Med Note Windle Guard, JACOB   Wed Jan 25, 2021  9:18 AM) Gets through patient assistance.   ferrous sulfate 324 MG TBEC [id number] Yes Take 324 mg by mouth. [provider] Taking Active   fish oil-omega-3 fatty acids 1000 MG capsule [id number] Yes Take 1 g by mouth daily. [provider] Taking Active Spouse/Significant Other  fluticasone (FLONASE) 50 MCG/ACT nasal spray [id number] Yes SPRAY 2 SPRAYS INTO EACH NOSTRIL EVERY DAY  Patient taking differently: Place 2 sprays into both nostrils daily.   Sheliah Hatch, MD Taking Active   furosemide (LASIX) 20 MG tablet [id number] Yes TAKE 1 TABLET BY MOUTH EVERY DAY Sheliah Hatch, MD Taking Active   glipiZIDE (GLUCOTROL XL) 10 MG 24 hr tablet [id number] Yes TAKE 1 TABLET BY MOUTH 2 (TWO) TIMES DAILY WITH A MEAL. Sheliah Hatch, MD Taking Active   glucose blood (FREESTYLE LITE) test strip [id number] Yes Use to check blood glucose up to twice a day (Dx - type 2 DM with long term insulin use - E11.65, Z79.4) Sheliah Hatch, MD Taking Active   insulin glargine (LANTUS SOLOSTAR) 100  UNIT/ML Solostar Pen [id number] Yes INJECT 10 UNITS INTO THE SKIN DAILY Sheliah Hatch, MD Taking Active   Insulin Pen Needle (PEN NEEDLES) 32G X 4 MM MISC [id number] Yes 1 Application by Does not apply route daily. Sheliah Hatch, MD Taking Active   Lancets (FREESTYLE) lancets [id number] Yes Use to check blood glucose up to twice a day (Dx - type 2 DM with long term insulin use - E11.65, Z79.4) Sheliah Hatch, MD Taking Active   loratadine (CLARITIN) 10 MG tablet [id number] Yes TAKE 1 TABLET BY MOUTH EVERY DAY  Patient taking differently: Take 10 mg by mouth daily.   Sheliah Hatch, MD Taking Active   Magnesium Oxide 420 MG TABS [id number] Yes Take by mouth. [provider] Taking Active   Melatonin 3 MG TABS [id number] Yes Take 10 mg by mouth at bedtime.  [provider] Taking Active Spouse/Significant Other  methocarbamol (ROBAXIN) 500 MG tablet [id number] Yes  Take by mouth. [provider] Taking Active   Multiple Vitamins-Minerals (VISION FORMULA) TABS [id number] Yes Take 1 tablet by mouth daily. [provider] Taking Active Spouse/Significant Other  OVER THE COUNTER MEDICATION [id number] Yes Take 1 tablet by mouth daily. Equate Vision Formula 50+ [provider] Taking Active Spouse/Significant Other  Semaglutide, 1 MG/DOSE, 4 MG/3ML SOPN [id number] Yes Inject 1 mg as directed once a week. Sheliah Hatch, MD Taking Active   sertraline (ZOLOFT) 50 MG tablet [id number] Yes TAKE 1 TABLET BY MOUTH EVERY DAY Sheliah Hatch, MD Taking Active   traMADol (ULTRAM) 50 MG tablet [id number] Yes Take 1 tablet by mouth every 6 (six) hours as needed. [provider] Taking Active                Assessment/Plan:   Diabetes: Currently not at A1c goal.  - Reviewed goal A1c, goal fasting, and goal 2 hour post prandial glucose  - continue Lantus, Ozempic and Farxiga for diabetes. Consider trying to maximize Ozempic dose eventually  to 2mg  per week of patient tolerates. Might be able to discontinue Lantus in future.  - Submitted updated Rx to [employer] medication assistance program for Ozempic. Patient will need to send or bring in copy of her [employer] Scripts insurance card.   - Will check back with patient after she sees Dr Signe Colt in July to see if she need medication assistance program for [place].  - Continue to check glucose twice a day.  - Reviewed blood glucose goals.  - reviewed how to treat hypoglycemia. Patient to contact office is continues to have low blood glucose < 60.   Medication Management: - Reviewed medication list and updated.  - Reviewed refill history and adherence - patient has filled maintenance meds on times so far in 2024.  - Reviewed patients Part D plan with her: She has Silver Scripts Plan [phone]). Explained to patient that she sas a $280 deductible to meet for 2024 (tier 1 meds are exclulded). Since Lantus is tier 3 she will pay 24% of med cost when filled.  Usually insulin cost is capped at $35 per month but since 1 box of Lantus actually lasts 5 months for her, she is only being charged 24% copay or $92 / 150 day supply   Follow Up Plan: 1 month to check on patient assistance program and adherence  Henrene Pastor, PharmD Clinical Pharmacist [facility]

## 2022-10-08 NOTE — Telephone Encounter (Signed)
Updated directions and faxed copy of insurance care to [employer]

## 2022-10-09 NOTE — Telephone Encounter (Signed)
Received notification from [employer] regarding approval for OZEMPIC. Patient assistance approved from 10/08/2022 to 06/25/2023.  Phone: [phone] SCANNING LETTER OF APPROVAL IN MEDIA OF CHART  PLEASE BE ADVISED   Melanee Spry CPhT Rx Patient Advocate [phone] \

## 2022-10-10 ENCOUNTER — Other Ambulatory Visit: Payer: Self-pay | Admitting: Family Medicine

## 2022-10-16 ENCOUNTER — Other Ambulatory Visit: Payer: Self-pay | Admitting: Family Medicine

## 2022-10-18 ENCOUNTER — Telehealth: Payer: Self-pay

## 2022-10-18 NOTE — Telephone Encounter (Signed)
Pt is aware of Ozempic is here to pickup . It is In fridge on my side in door her name is on it .

## 2022-10-30 ENCOUNTER — Other Ambulatory Visit: Payer: Medicare Other | Admitting: Pharmacist

## 2022-10-30 NOTE — Progress Notes (Signed)
10/30/2022 Name: Kayla Brown MRN: [id number] DOB: [birth year]  Chief Complaint  Patient presents with   Diabetes   Medication Management    Kayla Brown is a 71 y.o. year old female who presented for a telephone visit.   They were referred to the pharmacist by their PCP for assistance in managing diabetes and medication access.    Subjective:  Care Team: Primary Care Provider: Sheliah Hatch, MD ; Next Scheduled Visit: 12/05/2022 Cardiologist: Peter Swaziland, MD; Next Scheduled Visit:   Podiatrist: Dr Helane Gunther; Next Appointment: 11/23/2022 Nephrologist: Dr Signe Colt; Next Appointment: 12/2022  Medication Access/Adherence  Current Pharmacy:  [id number] [facility], [place] - [address] [place] [address] Phone: [phone] Fax: [phone]  [facility] [address] [place] - [address] [place] [address] Phone: [phone] Fax: [phone]   Patient reports affordability concerns with their medications: Yes  Patient reports access/transportation concerns to their pharmacy: No  Patient reports adherence concerns with their medications:  No     Patient received Ozempic 1mg  weekly dose from [employer] 10/18/2022.   She also received Marcelline Deist from [facility] and Me Program in 2023 - She states her nephrologist told her that there would be an alternative available that would be covered by her Medicare for 2024. Patient was not sure what that other medication was. Patient received an extra delivery of Farxiga in 2023 and states she has about 7 bottles on hand. She will see Dr Signe Colt in July 2024.   Diabetes: Current medications:  Ozempic 1mg  weekly. Lantus 10 units daily, Farxiga 10mg  daily, glipizide 10mg  twice a day Medications tried in the past: Tradjenta - stopped when changed to Ozempic  Current glucose readings: 100 to 140.  She has blood glucose < 70 about 2 times per month.  Reports she can tell when blood glucose drops. She is checking blood glucose 2 times a day.  Using new glucometer - Freestyle Lite.   She mentions that she has been experiencing constipation more frequently. She had been taking Fiber One supplement but she was instructed by nurse practitioner in her cardiolgist's office to take docusate 100mg  2 tablets = 200mg  per day instead. [name] says the stool softner has helped some but still has some days that she has hard stools.   She will also drink prune juice or eat bran flakes which seems to help with BM as well.   Objective:  Lab Results  Component Value Date   HGBA1C 7.8 (H) 08/31/2022    Lab Results  Component Value Date   CREATININE 1.56 (H) 08/31/2022   BUN 38 (H) 08/31/2022   NA 139 08/31/2022   K 4.7 08/31/2022   CL 103 08/31/2022   CO2 28 08/31/2022    Lab Results  Component Value Date   CHOL 125 04/30/2022   HDL 60.90 04/30/2022   LDLCALC 52 04/30/2022   LDLDIRECT 50.0 05/23/2020   TRIG 59.0 04/30/2022   CHOLHDL 2 04/30/2022    Medications Reviewed Today     Reviewed by Henrene Pastor, RPH-CPP (Pharmacist) on 10/30/22 at 1048  Med List Status: <None>   Medication Order Taking? Sig Documenting Provider Last Dose Status Informant  albuterol (VENTOLIN HFA) 108 (90 Base) MCG/ACT inhaler [id number] No Inhale 1-2 puffs into the lungs every 6 (six) hours as needed for wheezing or shortness of breath.  Patient not taking: Reported on 10/30/2022   [provider] Not Taking Active  Alcohol Swabs (ALCOHOL PREP) PADS [id number] Yes Pt uses an alcohol pad each time sugars are tested. Pt tests twice daily. Dx E11.9 Sheliah Hatch, MD Taking Active Spouse/Significant Other  amLODipine (NORVASC) 10 MG tablet [id number] Yes TAKE 1 TABLET BY MOUTH EVERY DAY Sheliah Hatch, MD Taking Active   atorvastatin (LIPITOR) 40 MG tablet [id number] Yes TAKE 1 TABLET BY MOUTH EVERY DAY Sheliah Hatch, MD Taking Active   B  Complex Vitamins (B COMPLEX PO) [id number] Yes Take 1 tablet by mouth daily. [provider] Taking Active   BD PEN NEEDLE NANO 2ND GEN 32G X 4 MM MISC [id number] Yes 1 APPLICATION BY DOES NOT APPLY ROUTE DAILY. Sheliah Hatch, MD Taking Active   Blood Glucose Monitoring Suppl (FREESTYLE LITE) w/Device KIT [id number] Yes Use to check blood glucose up to twice a day (Dx - type 2 DM with long term insulin use - E11.65, Z79.4) Sheliah Hatch, MD Taking Active   buPROPion (WELLBUTRIN XL) 300 MG 24 hr tablet [id number] Yes Take 1 tablet by mouth once daily Sheliah Hatch, MD Taking Active   Calcium Carb-Cholecalciferol [phone] MG-UNIT CAPS [id number] Yes Take 1 tablet by mouth 2 (two) times daily. [provider] Taking Active   carvedilol (COREG) 25 MG tablet [id number] Yes TAKE 1 TABLET BY MOUTH TWICE A DAY Swaziland, [name], MD Taking Active   dapagliflozin propanediol (FARXIGA) 10 MG TABS tablet [id number] Yes Take 10 mg by mouth daily. [provider] Taking Active Self           Med Note Windle Guard, JACOB   Wed Jan 25, 2021  9:18 AM) Gets through patient assistance.   ferrous sulfate 324 MG TBEC [id number] Yes Take 324 mg by mouth. [provider] Taking Active   fish oil-omega-3 fatty acids 1000 MG capsule [id number] Yes Take 1 g by mouth daily. [provider] Taking Active Spouse/Significant Other  fluticasone (FLONASE) 50 MCG/ACT nasal spray [id number] Yes SPRAY 2 SPRAYS INTO EACH NOSTRIL EVERY DAY  Patient taking differently: Place 2 sprays into both nostrils daily.   Sheliah Hatch, MD Taking Active   furosemide (LASIX) 20 MG tablet [id number] Yes TAKE 1 TABLET BY MOUTH EVERY DAY Sheliah Hatch, MD Taking Active   glipiZIDE (GLUCOTROL XL) 10 MG 24 hr tablet [id number] Yes TAKE 1 TABLET BY MOUTH 2 (TWO) TIMES DAILY WITH A MEAL. Sheliah Hatch, MD Taking Active   glucose blood (FREESTYLE LITE) test strip [id number] Yes Use to check blood  glucose up to twice a day (Dx - type 2 DM with long term insulin use - E11.65, Z79.4) Sheliah Hatch, MD Taking Active   insulin glargine (LANTUS SOLOSTAR) 100 UNIT/ML Solostar Pen [id number] Yes INJECT 10 UNITS INTO THE SKIN DAILY Sheliah Hatch, MD Taking Active   Lancets (FREESTYLE) lancets [id number] Yes Use to check blood glucose up to twice a day (Dx - type 2 DM with long term insulin use - E11.65, Z79.4) Sheliah Hatch, MD Taking Active   loratadine (CLARITIN) 10 MG tablet [id number] Yes TAKE 1 TABLET BY MOUTH EVERY DAY  Patient taking differently: Take 10 mg by mouth daily.   Sheliah Hatch, MD Taking Active   Magnesium Oxide 420 MG TABS [id number] Yes Take by mouth. [provider] Taking Active   Melatonin 3 MG TABS [id number] Yes Take 10 mg by mouth at bedtime.  [provider] Taking Active Spouse/Significant Other  methocarbamol (ROBAXIN) 500 MG tablet [id number]  Take by mouth. [provider]  Active   Multiple Vitamins-Minerals (VISION FORMULA) TABS [id number] Yes Take 1 tablet by mouth daily. [provider] Taking Active Spouse/Significant Other  OVER THE COUNTER MEDICATION [id number] Yes Take 1 tablet by mouth daily. Equate Vision Formula 50+ [provider] Taking Active Spouse/Significant Other  Semaglutide, 1 MG/DOSE, 4 MG/3ML SOPN [id number] Yes Inject 1 mg as directed once a week. Sheliah Hatch, MD Taking Active            Med Note Clydie Braun, [facility]   Tue Oct 30, 2022 10:47 AM) [employer] medication assistance program 2024  sertraline (ZOLOFT) 50 MG tablet [id number] Yes TAKE 1 TABLET BY MOUTH EVERY DAY Sheliah Hatch, MD Taking Active   traMADol (ULTRAM) 50 MG tablet [id number] Yes Take 1 tablet by mouth every 6 (six) hours as needed. [provider] Taking Active                Assessment/Plan:   Diabetes: Currently not at A1c goal.   - continue Lantus, Ozempic and Farxiga for diabetes.  Consider trying to maximize Ozempic dose eventually to 2mg  per week of patient tolerates. Might be able to discontinue Lantus in future. .  - Will check back with patient after she sees Dr Signe Colt in July to see if she needs medication assistance program for Farxiga.  - Continue to check glucose twice a day.  - Reviewed blood glucose goals.  - reviewed how to treat hypoglycemia. Patient to contact office is continues to have low blood glucose < 70.  Constipation:  - recommended she continue to eat high fiber foods to help with constipation.  - Add Miralax - 17 grams once daily if needed for constipation. - continue docusate 100mg  - take 2 capsules daily.  - Maintain adequate water intake - glass with meals and sip water between meals as well.     Henrene Pastor, PharmD Clinical Pharmacist [facility]

## 2022-11-23 ENCOUNTER — Encounter: Payer: Self-pay | Admitting: Podiatry

## 2022-11-23 ENCOUNTER — Ambulatory Visit (INDEPENDENT_AMBULATORY_CARE_PROVIDER_SITE_OTHER): Payer: Medicare Other | Admitting: Podiatry

## 2022-11-23 DIAGNOSIS — E1122 Type 2 diabetes mellitus with diabetic chronic kidney disease: Secondary | ICD-10-CM | POA: Diagnosis not present

## 2022-11-23 DIAGNOSIS — M79674 Pain in right toe(s): Secondary | ICD-10-CM | POA: Diagnosis not present

## 2022-11-23 DIAGNOSIS — N1832 Chronic kidney disease, stage 3b: Secondary | ICD-10-CM | POA: Diagnosis not present

## 2022-11-23 DIAGNOSIS — M79675 Pain in left toe(s): Secondary | ICD-10-CM

## 2022-11-23 DIAGNOSIS — B351 Tinea unguium: Secondary | ICD-10-CM | POA: Diagnosis not present

## 2022-11-23 NOTE — Progress Notes (Signed)
This patient returns to my office for at risk foot care.  This patient requires this care by a professional since this patient will be at risk due to having diabetes mellitus.  This patient is unable to cut nails herself since the patient cannot reach her nails.These nails are painful walking and wearing shoes.  This patient presents for at risk foot care today.  General Appearance  Alert, conversant and in no acute stress.  Vascular  Dorsalis pedis and posterior tibial  pulses are palpable  bilaterally.  Capillary return is within normal limits  bilaterally. Temperature is within normal limits  bilaterally.  Neurologic  Senn-Weinstein monofilament wire test within normal limits  bilaterally. Muscle power within normal limits bilaterally.  Nails Thick disfigured discolored nails with subungual debris  from hallux to fifth toes bilaterally. No evidence of bacterial infection or drainage bilaterally.  Orthopedic  No limitations of motion  feet .  No crepitus or effusions noted.  No bony pathology or digital deformities noted.  HAV  B/L.  Hammer toes  B/L.  Skin  normotropic skin with no porokeratosis noted bilaterally.  No signs of infections or ulcers noted.     Onychomycosis  Pain in right toes  Pain in left toes  Consent was obtained for treatment procedures.   Mechanical debridement of nails 1-5  bilaterally performed with a nail nipper.  Filed with dremel without incident.    Return office visit   3   mos.                  Told patient to return for periodic foot care and evaluation due to potential at risk complications.   Keni Wafer DPM   

## 2022-12-05 ENCOUNTER — Encounter: Payer: Self-pay | Admitting: Family Medicine

## 2022-12-05 ENCOUNTER — Ambulatory Visit (INDEPENDENT_AMBULATORY_CARE_PROVIDER_SITE_OTHER): Payer: Medicare Other | Admitting: Family Medicine

## 2022-12-05 VITALS — BP 122/70 | HR 60 | Temp 97.9°F | Resp 17 | Ht 60.0 in | Wt 194.5 lb

## 2022-12-05 DIAGNOSIS — Z794 Long term (current) use of insulin: Secondary | ICD-10-CM

## 2022-12-05 DIAGNOSIS — I10 Essential (primary) hypertension: Secondary | ICD-10-CM

## 2022-12-05 DIAGNOSIS — F419 Anxiety disorder, unspecified: Secondary | ICD-10-CM | POA: Diagnosis not present

## 2022-12-05 DIAGNOSIS — E785 Hyperlipidemia, unspecified: Secondary | ICD-10-CM | POA: Diagnosis not present

## 2022-12-05 DIAGNOSIS — E1122 Type 2 diabetes mellitus with diabetic chronic kidney disease: Secondary | ICD-10-CM | POA: Diagnosis not present

## 2022-12-05 DIAGNOSIS — F32A Depression, unspecified: Secondary | ICD-10-CM

## 2022-12-05 DIAGNOSIS — E1165 Type 2 diabetes mellitus with hyperglycemia: Secondary | ICD-10-CM | POA: Diagnosis not present

## 2022-12-05 LAB — BASIC METABOLIC PANEL
BUN: 37 mg/dL — ABNORMAL HIGH (ref 6–23)
CO2: 27 mEq/L (ref 19–32)
Calcium: 10.4 mg/dL (ref 8.4–10.5)
Chloride: 102 mEq/L (ref 96–112)
Creatinine, Ser: 1.45 mg/dL — ABNORMAL HIGH (ref 0.40–1.20)
GFR: 36.5 mL/min — ABNORMAL LOW (ref >60.00)
Glucose, Bld: 188 mg/dL — ABNORMAL HIGH (ref 70–99)
Potassium: 4.4 mEq/L (ref 3.5–5.1)
Sodium: 137 mEq/L (ref 135–145)

## 2022-12-05 LAB — CBC WITH DIFFERENTIAL/PLATELET
Basophils Absolute: 0.1 10*3/uL (ref 0.0–0.1)
Basophils Relative: 0.7 % (ref 0.0–3.0)
Eosinophils Absolute: 0.2 10*3/uL (ref 0.0–0.7)
Eosinophils Relative: 1.9 % (ref 0.0–5.0)
HCT: 45.3 % (ref 36.0–46.0)
Hemoglobin: 14.7 g/dL (ref 12.0–15.0)
Lymphocytes Relative: 17.3 % (ref 12.0–46.0)
Lymphs Abs: 1.6 10*3/uL (ref 0.7–4.0)
MCHC: 32.5 g/dL (ref 30.0–36.0)
MCV: 92.7 fl (ref 78.0–100.0)
Monocytes Absolute: 1 10*3/uL (ref 0.1–1.0)
Monocytes Relative: 10.4 % (ref 3.0–12.0)
Neutro Abs: 6.5 10*3/uL (ref 1.4–7.7)
Neutrophils Relative %: 69.7 % (ref 43.0–77.0)
Platelets: 239 10*3/uL (ref 150.0–400.0)
RBC: 4.88 Mil/uL (ref 3.87–5.11)
RDW: 14.4 % (ref 11.5–15.5)
WBC: 9.3 10*3/uL (ref 4.0–10.5)

## 2022-12-05 LAB — HEPATIC FUNCTION PANEL
ALT: 33 U/L (ref 0–35)
AST: 20 U/L (ref 0–37)
Albumin: 4.3 g/dL (ref 3.5–5.2)
Alkaline Phosphatase: 91 U/L (ref 39–117)
Bilirubin, Direct: 0.1 mg/dL (ref 0.0–0.3)
Total Bilirubin: 0.4 mg/dL (ref 0.2–1.2)
Total Protein: 7.2 g/dL (ref 6.0–8.3)

## 2022-12-05 LAB — LIPID PANEL
Cholesterol: 131 mg/dL (ref 0–200)
HDL: 53.8 mg/dL (ref >39.00)
LDL Cholesterol: 47 mg/dL (ref 0–99)
NonHDL: 77.55
Total CHOL/HDL Ratio: 2
Triglycerides: 151 mg/dL — ABNORMAL HIGH (ref 0.0–149.0)
VLDL: 30.2 mg/dL (ref 0.0–40.0)

## 2022-12-05 LAB — TSH: TSH: 1.65 u[IU]/mL (ref 0.35–5.50)

## 2022-12-05 MED ORDER — AMLODIPINE BESYLATE 10 MG PO TABS: 10.0000 mg | ORAL_TABLET | Freq: Every day | ORAL | 1 refills | Status: DC

## 2022-12-05 MED ORDER — ATORVASTATIN CALCIUM 40 MG PO TABS: 40.0000 mg | ORAL_TABLET | Freq: Every day | ORAL | 1 refills | Status: DC

## 2022-12-05 MED ORDER — GLIPIZIDE ER 10 MG PO TB24
10.0000 mg | ORAL_TABLET | Freq: Two times a day (BID) | ORAL | 1 refills | Status: DC
Start: 2022-12-05 — End: 2023-04-11

## 2022-12-05 MED ORDER — SERTRALINE HCL 50 MG PO TABS: 50.0000 mg | ORAL_TABLET | Freq: Every day | ORAL | 1 refills | Status: DC

## 2022-12-05 MED ORDER — LANTUS SOLOSTAR 100 UNIT/ML ~~LOC~~ SOPN: 10.0000 [IU] | PEN_INJECTOR | Freq: Every day | SUBCUTANEOUS | 1 refills | Status: DC

## 2022-12-05 MED ORDER — BUPROPION HCL ER (XL) 300 MG PO TB24: 300.0000 mg | ORAL_TABLET | Freq: Every day | ORAL | 0 refills | Status: DC

## 2022-12-05 MED ORDER — SEMAGLUTIDE (1 MG/DOSE) 4 MG/3ML ~~LOC~~ SOPN: 1.0000 mg | PEN_INJECTOR | SUBCUTANEOUS | 0 refills | Status: DC

## 2022-12-05 NOTE — Assessment & Plan Note (Signed)
Refill provided on Wellbutrin and Sertraline

## 2022-12-05 NOTE — Patient Instructions (Signed)
Follow up in 3-4 months to recheck sugars We'll notify you of your lab results and make any changes if needed Continue to work on healthy diet and regular physical activity- but don't over do it!! Call with any questions or concerns Stay Safe!  Stay Healthy! Have a great summer!!!

## 2022-12-05 NOTE — Assessment & Plan Note (Signed)
Chronic problem, well controlled on Amlodipine 10mg  daily, Coreg 25mg  BID, Lasix 20mg  daily.  Asymptomatic.  Check labs due to diuretic use but no anticipated med changes.

## 2022-12-05 NOTE — Assessment & Plan Note (Signed)
Ongoing issue.  BMI 37.99 and coupled w/ DM, HTN, hyperlipidemia this qualifies as morbid obesity.  Encouraged low carb diet and regular physical activity.

## 2022-12-05 NOTE — Assessment & Plan Note (Signed)
Chronic problem, on Lipitor 40mg daily w/o difficulty.  Check labs.  Adjust meds prn  

## 2022-12-05 NOTE — Progress Notes (Signed)
   Subjective:    Patient ID: Kayla Brown, female    DOB: April 30, 1951, 72 y.o.   MRN: [id number]  HPI DM- chronic problem, on Farxiga 10mg  daily, Glipizide 10mg  BID, Lantus 10 units nightly, Ozempic 1mg  weekly.  Pt reports feeling 'pretty good'.  Denies symptomatic lows.  No numbness/tingling of hands/feet.  HTN- chronic problem.  On Amlodipine 10mg  daily, Coreg 25mg  BID, Lasix 20mg  daily.  No CP, SOB, HA's, visual changes, edema  Hyperlipidemia- chronic problem, on Lipitor 40mg  daily.  No abd pain, N/V.   Review of Systems For ROS see HPI     Objective:   Physical Exam Vitals reviewed.  Constitutional:      General: She is not in acute distress.    Appearance: Normal appearance. She is well-developed. She is obese. She is not ill-appearing.  HENT:     Head: Normocephalic and atraumatic.  Eyes:     Conjunctiva/sclera: Conjunctivae normal.     Pupils: Pupils are equal, round, and reactive to light.  Neck:     Thyroid: No thyromegaly.  Cardiovascular:     Rate and Rhythm: Normal rate and regular rhythm.     Heart sounds: Normal heart sounds. No murmur heard. Pulmonary:     Effort: Pulmonary effort is normal. No respiratory distress.     Breath sounds: Normal breath sounds.  Abdominal:     General: There is no distension.     Palpations: Abdomen is soft.     Tenderness: There is no abdominal tenderness.  Musculoskeletal:     Cervical back: Normal range of motion and neck supple.     Right lower leg: No edema.     Left lower leg: No edema.  Lymphadenopathy:     Cervical: No cervical adenopathy.  Skin:    General: Skin is warm and dry.  Neurological:     Mental Status: She is alert and oriented to person, place, and time. Mental status is at baseline.  Psychiatric:        Mood and Affect: Mood normal.        Behavior: Behavior normal.        Thought Content: Thought content normal.           Assessment & Plan:

## 2022-12-05 NOTE — Assessment & Plan Note (Signed)
Chronic problem.  On Farxiga 10mg  daily, Glipizide 10mg  BID, Lantus 10 units nightly, and Ozempic 1mg  weekly.  UTD on foot exam, eye exam, and microalbumin.  Check labs.  Adjust meds prn

## 2022-12-06 ENCOUNTER — Other Ambulatory Visit: Payer: Self-pay | Admitting: Cardiology

## 2022-12-06 ENCOUNTER — Telehealth: Payer: Self-pay

## 2022-12-06 ENCOUNTER — Ambulatory Visit (INDEPENDENT_AMBULATORY_CARE_PROVIDER_SITE_OTHER): Payer: Medicare Other

## 2022-12-06 DIAGNOSIS — Z794 Long term (current) use of insulin: Secondary | ICD-10-CM

## 2022-12-06 DIAGNOSIS — E1165 Type 2 diabetes mellitus with hyperglycemia: Secondary | ICD-10-CM | POA: Diagnosis not present

## 2022-12-06 LAB — HEMOGLOBIN A1C: Hgb A1c MFr Bld: 8.3 % — ABNORMAL HIGH (ref 4.6–6.5)

## 2022-12-06 NOTE — Telephone Encounter (Signed)
-----   Message from Sheliah Hatch, MD sent at 12/06/2022  7:21 AM EDT ----- Labs are stable and look good.  Waiting on A1C.  It looks like this was ordered yesterday as future (which is a mistake).  Can we call the lab and ask them to add it on?

## 2022-12-07 ENCOUNTER — Telehealth: Payer: Self-pay

## 2022-12-07 NOTE — Telephone Encounter (Signed)
-----   Message from Sheliah Hatch, MD sent at 12/06/2022  3:53 PM EDT ----- Your A1C has jumped from 7.8 --> 8.3%  Please increase your Lantus pen to 12 units nightly and be very aware of your diet.  Try and avoid sugar and carbs as much as possible

## 2023-01-02 DIAGNOSIS — I129 Hypertensive chronic kidney disease with stage 1 through stage 4 chronic kidney disease, or unspecified chronic kidney disease: Secondary | ICD-10-CM | POA: Diagnosis not present

## 2023-01-02 DIAGNOSIS — D631 Anemia in chronic kidney disease: Secondary | ICD-10-CM | POA: Diagnosis not present

## 2023-01-02 DIAGNOSIS — I5032 Chronic diastolic (congestive) heart failure: Secondary | ICD-10-CM | POA: Diagnosis not present

## 2023-01-02 DIAGNOSIS — N189 Chronic kidney disease, unspecified: Secondary | ICD-10-CM | POA: Diagnosis not present

## 2023-01-02 DIAGNOSIS — N2581 Secondary hyperparathyroidism of renal origin: Secondary | ICD-10-CM | POA: Diagnosis not present

## 2023-01-02 DIAGNOSIS — N1831 Chronic kidney disease, stage 3a: Secondary | ICD-10-CM | POA: Diagnosis not present

## 2023-01-02 DIAGNOSIS — E1122 Type 2 diabetes mellitus with diabetic chronic kidney disease: Secondary | ICD-10-CM | POA: Diagnosis not present

## 2023-01-03 LAB — LAB REPORT - SCANNED
Albumin, Urine POC: 4.6
Albumin/Creatinine Ratio, Urine, POC: 10
Creatinine, POC: 46.1 mg/dL
EGFR: 40

## 2023-01-04 ENCOUNTER — Telehealth: Payer: Self-pay

## 2023-01-04 NOTE — Telephone Encounter (Signed)
I have informed pt that her Ozempic is in my fridge on my side . She will pick up on Monday

## 2023-01-23 ENCOUNTER — Encounter (INDEPENDENT_AMBULATORY_CARE_PROVIDER_SITE_OTHER): Payer: Self-pay

## 2023-01-31 ENCOUNTER — Other Ambulatory Visit: Payer: Medicare Other | Admitting: Pharmacist

## 2023-01-31 ENCOUNTER — Encounter: Payer: Self-pay | Admitting: Pharmacist

## 2023-01-31 NOTE — Progress Notes (Signed)
01/31/2023 Name: Kayla Brown MRN: [id number] DOB: 04/11/1951  Chief Complaint  Patient presents with   Diabetes    Follow up     Kayla Brown is a 72 y.o. year old female who presented for a telephone visit.   They were referred to the pharmacist by their PCP for assistance in managing diabetes and medication access.    Subjective:  Care Team: Primary Care Provider: Sheliah Hatch, MD ; Next Scheduled Visit: 03/07/2023 Cardiologist: Peter Swaziland, MD / Lyman Bishop, NP; Next Scheduled Visit: 03/15/2023 Podiatrist: Dr Helane Gunther; Next Appointment:02/21/2023  Nephrologist: Dr Signe Colt; Next Appointment: 03/2023 Pulmonologist: Dr Maple Hudson: Next Appointment: 07/08/2023  Medication Access/Adherence  Current Pharmacy:  [id number] [place], [place] - [address] [place] [address] Phone: [phone] Fax: [phone]  [facility] [address] [place] - [address] [place] [address] Phone: [phone] Fax: [phone]   Patient reports affordability concerns with their medications: Yes  Patient reports access/transportation concerns to their pharmacy: No  Patient reports adherence concerns with their medications:  No     Patient received Ozempic 1mg  weekly dose from [employer] 01/04/2023  She also received Marcelline Deist from [facility] and Me Program in 2023 - She states her nephrologist told her that there would be an alternative available that would be covered by her Medicare for 2024 or 2025. Per Kayla Brown she received an extra delivery of Farxiga in 2023 and states she still has about 6 bottles on hand.   Diabetes: Current medications:  Ozempic 1mg  weekly. Lantus 12 units daily (does increased after last OV with PCP) , Marcelline Deist 10mg  daily, glipizide 10mg  twice a day Medications tried in the past: Tradjenta - stopped when changed to Ozempic  Current glucose readings: 99, 154, 146, 176, 172,  99 Denies recent blood glucose < 70 She is checking blood glucose 2 times a day.  Using Freestyle Lite.   Reports that constipation has improved since she started to use stool softener.    Diet:  Bran flakes some mornings to help with constipation.  Trying to eat more vegetables and fruits but she loves potatoes.   Objective:  Lab Results  Component Value Date   HGBA1C 8.3 (H) 12/06/2022    Lab Results  Component Value Date   CREATININE 1.45 (H) 12/05/2022   BUN 37 (H) 12/05/2022   NA 137 12/05/2022   K 4.4 12/05/2022   CL 102 12/05/2022   CO2 27 12/05/2022    Lab Results  Component Value Date   CHOL 131 12/05/2022   HDL 53.80 12/05/2022   LDLCALC 47 12/05/2022   LDLDIRECT 50.0 05/23/2020   TRIG 151.0 (H) 12/05/2022   CHOLHDL 2 12/05/2022    Medications Reviewed Today   Medications were not reviewed in this encounter        Assessment/Plan:   Diabetes: Currently not at A1c goal.   - Continue Lantus, Ozempic and Farxiga for diabetes. Consider trying to maximize Ozempic dose eventually to 2mg  per week if patient tolerates.  - Continue to check glucose twice a day.  Discussed Continuous Glucose Monitor and offered to check to see if her insurance could cover. Patient is considering Continuous Glucose Monitor.  - Reviewed blood glucose goals.  - Reviewed how to treat hypoglycemia. Patient to contact office is continues to have low blood glucose < 70.  Constipation:  continue docusate 100mg  - take 2 capsules daily. -  Continue to eat high fiber foods  to help with constipation.  - Add Miralax - 17 grams once daily if needed for constipation. - Maintain adequate water intake - glass with meals and sip water between meals as well.   Health Maintenance:  Updated vaccine records to include Shingrix and Boostrix [facility]. ([facility] and [facility])   Follow up planned for October 2024 to start  medication assistance program application for 2025.   Henrene Pastor, PharmD Clinical Pharmacist [facility]

## 2023-02-06 DIAGNOSIS — M47816 Spondylosis without myelopathy or radiculopathy, lumbar region: Secondary | ICD-10-CM | POA: Diagnosis not present

## 2023-02-06 DIAGNOSIS — M4316 Spondylolisthesis, lumbar region: Secondary | ICD-10-CM | POA: Diagnosis not present

## 2023-02-06 DIAGNOSIS — Z6839 Body mass index (BMI) 39.0-39.9, adult: Secondary | ICD-10-CM | POA: Diagnosis not present

## 2023-02-07 ENCOUNTER — Encounter (INDEPENDENT_AMBULATORY_CARE_PROVIDER_SITE_OTHER): Payer: Self-pay

## 2023-02-07 ENCOUNTER — Ambulatory Visit (INDEPENDENT_AMBULATORY_CARE_PROVIDER_SITE_OTHER): Payer: Medicare Other | Admitting: *Deleted

## 2023-02-07 DIAGNOSIS — Z0000 Encounter for general adult medical examination without abnormal findings: Secondary | ICD-10-CM

## 2023-02-07 NOTE — Patient Instructions (Signed)
Kayla Brown , Thank you for taking time to come for your Medicare Wellness Visit. I appreciate your ongoing commitment to your health goals. Please review the following plan we discussed and let me know if I can assist you in the future.   Screening recommendations/referrals: Colonoscopy: up to date Mammogram: schedule mammogram Bone Density: up to date Recommended yearly ophthalmology/optometry visit for glaucoma screening and checkup Recommended yearly dental visit for hygiene and checkup  Vaccinations: Influenza vaccine: Education provided Pneumococcal vaccine: up to date  Tdap vaccine: Education provided Shingles vaccine: Education provided    Advanced directives: Education provided    Preventive Care 65 Years and Older, Female Preventive care refers to lifestyle choices and visits with your health care provider that can promote health and wellness. What does preventive care include? A yearly physical exam. This is also called an annual well check. Dental exams once or twice a year. Routine eye exams. Ask your health care provider how often you should have your eyes checked. Personal lifestyle choices, including: Daily care of your teeth and gums. Regular physical activity. Eating a healthy diet. Avoiding tobacco and drug use. Limiting alcohol use. Practicing safe sex. Taking low-dose aspirin every day. Taking vitamin and mineral supplements as recommended by your health care provider. What happens during an annual well check? The services and screenings done by your health care provider during your annual well check will depend on your age, overall health, lifestyle risk factors, and family history of disease. Counseling  Your health care provider may ask you questions about your: Alcohol use. Tobacco use. Drug use. Emotional well-being. Home and relationship well-being. Sexual activity. Eating habits. History of falls. Memory and ability to understand  (cognition). Work and work Astronomer. Reproductive health. Screening  You may have the following tests or measurements: Height, weight, and BMI. Blood pressure. Lipid and cholesterol levels. These may be checked every 5 years, or more frequently if you are over 9 years old. Skin check. Lung cancer screening. You may have this screening every year starting at age 54 if you have a 30-pack-year history of smoking and currently smoke or have quit within the past 15 years. Fecal occult blood test (FOBT) of the stool. You may have this test every year starting at age 75. Flexible sigmoidoscopy or colonoscopy. You may have a sigmoidoscopy every 5 years or a colonoscopy every 10 years starting at age 24. Hepatitis C blood test. Hepatitis B blood test. Sexually transmitted disease (STD) testing. Diabetes screening. This is done by checking your blood sugar (glucose) after you have not eaten for a while (fasting). You may have this done every 1-3 years. Bone density scan. This is done to screen for osteoporosis. You may have this done starting at age 21. Mammogram. This may be done every 1-2 years. Talk to your health care provider about how often you should have regular mammograms. Talk with your health care provider about your test results, treatment options, and if necessary, the need for more tests. Vaccines  Your health care provider may recommend certain vaccines, such as: Influenza vaccine. This is recommended every year. Tetanus, diphtheria, and acellular pertussis (Tdap, Td) vaccine. You may need a Td booster every 10 years. Zoster vaccine. You may need this after age 39. Pneumococcal 13-valent conjugate (PCV13) vaccine. One dose is recommended after age 61. Pneumococcal polysaccharide (PPSV23) vaccine. One dose is recommended after age 76. Talk to your health care provider about which screenings and vaccines you need and how often you need  them. This information is not intended to  replace advice given to you by your health care provider. Make sure you discuss any questions you have with your health care provider. Document Released: 07/08/2015 Document Revised: 02/29/2016 Document Reviewed: 04/12/2015 Elsevier Interactive Patient Education  2017 [employer].  Fall Prevention in the Home Falls can cause injuries. They can happen to people of all ages. There are many things you can do to make your home safe and to help prevent falls. What can I do on the outside of my home? Regularly fix the edges of walkways and driveways and fix any cracks. Remove anything that might make you trip as you walk through a door, such as a raised step or threshold. Trim any bushes or trees on the path to your home. Use bright outdoor lighting. Clear any walking paths of anything that might make someone trip, such as rocks or tools. Regularly check to see if handrails are loose or broken. Make sure that both sides of any steps have handrails. Any raised decks and porches should have guardrails on the edges. Have any leaves, snow, or ice cleared regularly. Use sand or salt on walking paths during winter. Clean up any spills in your garage right away. This includes oil or grease spills. What can I do in the bathroom? Use night lights. Install grab bars by the toilet and in the tub and shower. Do not use towel bars as grab bars. Use non-skid mats or decals in the tub or shower. If you need to sit down in the shower, use a plastic, non-slip stool. Keep the floor dry. Clean up any water that spills on the floor as soon as it happens. Remove soap buildup in the tub or shower regularly. Attach bath mats securely with double-sided non-slip rug tape. Do not have throw rugs and other things on the floor that can make you trip. What can I do in the bedroom? Use night lights. Make sure that you have a light by your bed that is easy to reach. Do not use any sheets or blankets that are too big for  your bed. They should not hang down onto the floor. Have a firm chair that has side arms. You can use this for support while you get dressed. Do not have throw rugs and other things on the floor that can make you trip. What can I do in the kitchen? Clean up any spills right away. Avoid walking on wet floors. Keep items that you use a lot in easy-to-reach places. If you need to reach something above you, use a strong step stool that has a grab bar. Keep electrical cords out of the way. Do not use floor polish or wax that makes floors slippery. If you must use wax, use non-skid floor wax. Do not have throw rugs and other things on the floor that can make you trip. What can I do with my stairs? Do not leave any items on the stairs. Make sure that there are handrails on both sides of the stairs and use them. Fix handrails that are broken or loose. Make sure that handrails are as long as the stairways. Check any carpeting to make sure that it is firmly attached to the stairs. Fix any carpet that is loose or worn. Avoid having throw rugs at the top or bottom of the stairs. If you do have throw rugs, attach them to the floor with carpet tape. Make sure that you have a light switch at  the top of the stairs and the bottom of the stairs. If you do not have them, ask someone to add them for you. What else can I do to help prevent falls? Wear shoes that: Do not have high heels. Have rubber bottoms. Are comfortable and fit you well. Are closed at the toe. Do not wear sandals. If you use a stepladder: Make sure that it is fully opened. Do not climb a closed stepladder. Make sure that both sides of the stepladder are locked into place. Ask someone to hold it for you, if possible. Clearly mark and make sure that you can see: Any grab bars or handrails. First and last steps. Where the edge of each step is. Use tools that help you move around (mobility aids) if they are needed. These  include: Canes. Walkers. Scooters. Crutches. Turn on the lights when you go into a dark area. Replace any light bulbs as soon as they burn out. Set up your furniture so you have a clear path. Avoid moving your furniture around. If any of your floors are uneven, fix them. If there are any pets around you, be aware of where they are. Review your medicines with your doctor. Some medicines can make you feel dizzy. This can increase your chance of falling. Ask your doctor what other things that you can do to help prevent falls. This information is not intended to replace advice given to you by your health care provider. Make sure you discuss any questions you have with your health care provider. Document Released: 04/07/2009 Document Revised: 11/17/2015 Document Reviewed: 07/16/2014 Elsevier Interactive Patient Education  2017 [employer].

## 2023-02-07 NOTE — Progress Notes (Signed)
Subjective:   Kayla Brown is a 72 y.o. female who presents for Medicare Annual (Subsequent) preventive examination.  Visit Complete: Virtual  I connected with  Kayla Brown on 02/07/23 by a audio enabled telemedicine application and verified that I am speaking with the correct person using two identifiers.  Patient Location: Home  Provider Location: Home Office  I discussed the limitations of evaluation and management by telemedicine. The patient expressed understanding and agreed to proceed.  Patient Medicare AWV questionnaire was completed by the patient on 02-06-2023; I have confirmed that all information answered by patient is correct and no changes since this date.  Vital Signs: Unable to obtain new vitals due to this being a telehealth visit.   Review of Systems     Cardiac Risk Factors include: advanced age (>3men, >62 women);diabetes mellitus;hypertension     Objective:    Today's Vitals   02/07/23 0835  PainSc: 3    There is no height or weight on file to calculate BMI.     02/07/2023    8:43 AM 02/01/2022    8:23 AM 12/20/2021   12:11 PM 12/12/2020    9:49 AM 10/26/2020   10:45 AM 08/10/2020   10:33 AM 05/27/2020    9:58 AM  Advanced Directives  Does Patient Have a Medical Advance Directive? No No No  No No   Would patient like information on creating a medical advance directive? No - Patient declined No - Patient declined Yes (ED - Information included in AVS) No - Patient declined No - Patient declined No - Patient declined --    Current Medications (verified) Outpatient Encounter Medications as of 02/07/2023  Medication Sig   albuterol (VENTOLIN HFA) 108 (90 Base) MCG/ACT inhaler Inhale 1-2 puffs into the lungs every 6 (six) hours as needed for wheezing or shortness of breath.   Alcohol Swabs (ALCOHOL PREP) PADS Pt uses an alcohol pad each time sugars are tested. Pt tests twice daily. Dx E11.9   amLODipine (NORVASC) 10 MG tablet Take 1 tablet (10 mg  total) by mouth daily.   atorvastatin (LIPITOR) 40 MG tablet Take 1 tablet (40 mg total) by mouth daily.   B Complex Vitamins (B COMPLEX PO) Take 1 tablet by mouth daily.   BD PEN NEEDLE NANO 2ND GEN 32G X 4 MM MISC 1 APPLICATION BY DOES NOT APPLY ROUTE DAILY.   Blood Glucose Monitoring Suppl (FREESTYLE LITE) w/Device KIT Use to check blood glucose up to twice a day (Dx - type 2 DM with long term insulin use - E11.65, Z79.4)   buPROPion (WELLBUTRIN XL) 300 MG 24 hr tablet Take 1 tablet (300 mg total) by mouth daily.   Calcium Carb-Cholecalciferol [phone] MG-UNIT CAPS Take 1 tablet by mouth 2 (two) times daily.   carvedilol (COREG) 25 MG tablet TAKE 1 TABLET BY MOUTH TWICE A DAY   dapagliflozin propanediol (FARXIGA) 10 MG TABS tablet Take 10 mg by mouth daily.   ferrous sulfate 324 MG TBEC Take 324 mg by mouth.   fish oil-omega-3 fatty acids 1000 MG capsule Take 1 g by mouth daily.   fluticasone (FLONASE) 50 MCG/ACT nasal spray SPRAY 2 SPRAYS INTO EACH NOSTRIL EVERY DAY (Patient taking differently: Place 2 sprays into both nostrils daily.)   furosemide (LASIX) 20 MG tablet TAKE 1 TABLET BY MOUTH EVERY DAY   glipiZIDE (GLUCOTROL XL) 10 MG 24 hr tablet Take 1 tablet (10 mg total) by mouth 2 (two) times daily with a meal.  glucose blood (FREESTYLE LITE) test strip Use to check blood glucose up to twice a day (Dx - type 2 DM with long term insulin use - E11.65, Z79.4)   insulin glargine (LANTUS SOLOSTAR) 100 UNIT/ML Solostar Pen Inject 10 Units into the skin daily. (Patient taking differently: Inject 12 Units into the skin daily.)   Lancets (FREESTYLE) lancets Use to check blood glucose up to twice a day (Dx - type 2 DM with long term insulin use - E11.65, Z79.4)   loratadine (CLARITIN) 10 MG tablet TAKE 1 TABLET BY MOUTH EVERY DAY (Patient taking differently: Take 10 mg by mouth daily.)   Magnesium Oxide 420 MG TABS Take by mouth.   Melatonin 3 MG TABS Take 10 mg by mouth at bedtime.     methocarbamol (ROBAXIN) 500 MG tablet Take by mouth.   Multiple Vitamins-Minerals (VISION FORMULA) TABS Take 1 tablet by mouth daily.   OVER THE COUNTER MEDICATION Take 1 tablet by mouth daily. Equate Vision Formula 50+   Semaglutide, 1 MG/DOSE, 4 MG/3ML SOPN Inject 1 mg as directed once a week.   sertraline (ZOLOFT) 50 MG tablet Take 1 tablet (50 mg total) by mouth daily.   traMADol (ULTRAM) 50 MG tablet Take 1 tablet by mouth every 6 (six) hours as needed.   No facility-administered encounter medications on file as of 02/07/2023.    Allergies (verified) Patient has no known allergies.   History: Past Medical History:  Diagnosis Date   Anxiety    Congestive heart failure (CHF) (HCC)    Diabetes mellitus    Edema of extremities 03/02/2016   Essential hypertension, benign    Lazy eye    Major depressive disorder, single episode, unspecified    Obesity (BMI 30-39.9) 03/01/2016   OSA (obstructive sleep apnea)    Pulmonary HTN (HCC)    mild with PASP by echo 05/2016   Pure hypercholesterolemia    Retinopathy, due to hypertension    Past Surgical History:  Procedure Laterality Date   CPAP TITRATION  10/14/2015   DILATION AND CURETTAGE OF UTERUS  2005-2008   x2   EYE SURGERY     cataracts   WISDOM TOOTH EXTRACTION     Family History  Adopted: Yes  Problem Relation Age of Onset   Alcohol abuse Mother    ADD / ADHD Mother    Dementia Mother    Breast cancer Neg Hx    Social History   Socioeconomic History   Marital status: Married    Spouse name: Cherre Huger   Number of children: Not on file   Years of education: Not on file   Highest education level: Some college, no degree  Occupational History   Occupation: retired  Tobacco Use   Smoking status: Never   Smokeless tobacco: Never  Vaping Use   Vaping status: Never Used  Substance and Sexual Activity   Alcohol use: No    Alcohol/week: 0.0 standard drinks of alcohol   Drug use: No   Sexual activity: Yes     Partners: Male    Birth control/protection: None  Other Topics Concern   Not on file  Social History Narrative   Not on file   Social Determinants of Health   Financial Resource Strain: Low Risk  (02/07/2023)   Overall Financial Resource Strain (CARDIA)    Difficulty of Paying Living Expenses: Not very hard  Food Insecurity: No Food Insecurity (02/07/2023)   Hunger Vital Sign    Worried About Running Out of  Food in the Last Year: Never true    Ran Out of Food in the Last Year: Never true  Transportation Needs: No Transportation Needs (02/07/2023)   PRAPARE - Administrator, Civil Service (Medical): No    Lack of Transportation (Non-Medical): No  Physical Activity: Insufficiently Active (02/07/2023)   Exercise Vital Sign    Days of Exercise per Week: 2 days    Minutes of Exercise per Session: 30 min  Stress: No Stress Concern Present (02/07/2023)   [employer] of Occupational Health - Occupational Stress Questionnaire    Feeling of Stress : Not at all  Social Connections: Moderately Integrated (02/07/2023)   Social Connection and Isolation Panel [NHANES]    Frequency of Communication with Friends and Family: Three times a week    Frequency of Social Gatherings with Friends and Family: Once a week    Attends Religious Services: More than 4 times per year    Active Member of [employer] or Organizations: No    Attends Engineer, structural: Never    Marital Status: Married    Tobacco Counseling Counseling given: Not Answered   Clinical Intake:  Pre-visit preparation completed: Yes  Pain : 0-10 Pain Score: 3  Pain Location: Back Pain Descriptors / Indicators: Burning, Dull, Aching Pain Onset: More than a month ago Pain Frequency: Constant Pain Relieving Factors: tramadol  Pain Relieving Factors: tramadol  Nutritional Risks: None Diabetes: Yes CBG done?: No Did pt. bring in CBG monitor from home?: No  How often do you need to have someone help you  when you read instructions, pamphlets, or other written materials from your doctor or pharmacy?: 1 - Never  Interpreter Needed?: No  Information entered by :: Remi Haggard LPN   Activities of Daily Living    02/07/2023    8:37 AM 02/06/2023    8:17 AM  In your present state of health, do you have any difficulty performing the following activities:  Hearing? 1 1  Vision? 0 0  Difficulty concentrating or making decisions? 0 0  Walking or climbing stairs? 1 1  Dressing or bathing? 0 0  Doing errands, shopping? 0 0  Preparing Food and eating ? N N  Using the Toilet? N N  In the past six months, have you accidently leaked urine? Y Y  Do you have problems with loss of bowel control? N N  Managing your Medications? N N  Managing your Finances? N N  Housekeeping or managing your Housekeeping? N N    Patient Care Team: Sheliah Hatch, MD as PCP - General (Family Medicine) [place], [name], MD as PCP - Cardiology (Cardiology) Carman Ching, MD (Inactive) as Consulting Physician (Gastroenterology) Jerene Bears, MD as Consulting Physician (Gynecology) Erroll Luna, [facility] (Inactive) (Pharmacist) [place], [name], MD as Consulting Physician (Cardiology)  Indicate any recent Medical Services you may have received from other than [facility] providers in the past year (date may be approximate).     Assessment:   This is a routine wellness examination for [place].  Hearing/Vision screen Hearing Screening - Comments:: Bilateral hearing aids  Vision Screening - Comments:: Up to date [facility]  Dietary issues and exercise activities discussed:     Goals Addressed             This Visit's Progress    Patient Stated       Get sugar under control       Depression Screen    02/07/2023  8:42 AM 12/05/2022    8:01 AM 08/31/2022    7:50 AM 04/30/2022    7:52 AM 02/01/2022    8:23 AM 02/01/2022    8:22 AM 01/25/2022    8:47 AM  PHQ 2/9 Scores  PHQ - 2 Score 0 0 0 1 0 0 2  PHQ- 9  Score 1 0 0 2   5    Fall Risk    02/07/2023    8:37 AM 02/06/2023    8:17 AM 12/05/2022    8:01 AM 08/31/2022    7:50 AM 04/30/2022    7:52 AM  Fall Risk   Falls in the past year? 1 1 0 0 0  Number falls in past yr: 1 1 0 0   Injury with Fall? 0 0 0 0   Risk for fall due to : Impaired balance/gait  No Fall Risks No Fall Risks No Fall Risks  Follow up Falls evaluation completed;Education provided;Falls prevention discussed  Falls evaluation completed Falls evaluation completed Falls evaluation completed    MEDICARE RISK AT HOME:   TIMED UP AND GO:  Was the test performed?  No    Cognitive Function:        02/07/2023    8:39 AM 02/01/2022    8:27 AM  6CIT Screen  What Year? 0 points 0 points  What month? 0 points 0 points  What time? 0 points 0 points  Count back from 20 0 points 0 points  Months in reverse 0 points 0 points  Repeat phrase 0 points 0 points  Total Score 0 points 0 points    Immunizations Immunization History  Administered Date(s) Administered   Fluad Quad(high Dose 65+) 02/16/2019, 03/28/2020, 04/26/2021   Influenza, High Dose Seasonal PF 04/30/2022   Influenza,inj,Quad PF,6+ Mos 04/24/2016, 03/04/2017, 03/20/2018   PFIZER(Purple Top)SARS-COV-2 Vaccination 09/24/2019, 10/15/2019, 04/25/2020   Pneumococcal Conjugate-13 07/06/2016   Pneumococcal Polysaccharide-23 07/01/2017   Tdap 01/12/2023   Zoster Recombinant(Shingrix) 01/12/2023    TDAP - Patient states she  will bring documentation to upcoming visit  Flu Vaccine status: Due, Education has been provided regarding the importance of this vaccine. Advised may receive this vaccine at local pharmacy or Health Dept. Aware to provide a copy of the vaccination record if obtained from local pharmacy or Health Dept. Verbalized acceptance and understanding.  Pneumococcal vaccine status: Up to date  Covid-19 vaccine status: Information provided on how to obtain vaccines.   Qualifies for Shingles Vaccine?  Yes   Zostavax completed Yes   Shingrix Completed?: No.    Education has been provided regarding the importance of this vaccine. Patient has been advised to call insurance company to determine out of pocket expense if they have not yet received this vaccine. Advised may also receive vaccine at local pharmacy or Health Dept. Verbalized acceptance and understanding.  Screening Tests Health Maintenance  Topic Date Due   INFLUENZA VACCINE  01/24/2023   MAMMOGRAM  02/21/2023   FOOT EXAM  04/10/2023   HEMOGLOBIN A1C  06/07/2023   OPHTHALMOLOGY EXAM  08/15/2023   Diabetic kidney evaluation - eGFR measurement  01/03/2024   Diabetic kidney evaluation - Urine ACR  01/03/2024   Medicare Annual Wellness (AWV)  02/07/2024   Colonoscopy  12/15/2026   Pneumonia Vaccine 68+ Years old  Completed   DEXA SCAN  Completed   Hepatitis C Screening  Completed   HPV VACCINES  Aged Out   DTaP/Tdap/Td  Discontinued   COVID-19 Vaccine  Discontinued   Zoster  Vaccines- Shingrix  Discontinued    Health Maintenance  Health Maintenance Due  Topic Date Due   INFLUENZA VACCINE  01/24/2023    Colorectal cancer screening: Type of screening: Colonoscopy. Completed 2018. Repeat every 10 years  Mammogram  is going to schedule  Bone Density completed 2022  Lung Cancer Screening: (Low Dose CT Chest recommended if Age 27-80 years, 20 pack-year currently smoking OR have quit w/in 15years.) does not qualify.   Lung Cancer Screening Referral:   Additional Screening:  Hepatitis C Screening:  qualify; Completed   Vision Screening: Recommended annual ophthalmology exams for early detection of glaucoma and other disorders of the eye. Is the patient up to date with their annual eye exam?  Yes  Who is the provider or what is the name of the office in which the patient attends annual eye exams? [facility] If pt is not established with a provider, would they like to be referred to a provider to establish care? No .   Dental  Screening: Recommended annual dental exams for proper oral hygiene  Nutrition Risk Assessment:  Has the patient had any N/V/D within the last 2 months?  No  Does the patient have any non-healing wounds?  No  Has the patient had any unintentional weight loss or weight gain?  No   Diabetes:  Is the patient diabetic?  Yes  If diabetic, was a CBG obtained today?  No  Did the patient bring in their glucometer from home?  No  How often do you monitor your CBG's? 2 x a day.   Financial Strains and Diabetes Management:  Are you having any financial strains with the device, your supplies or your medication? No .  Does the patient want to be seen by Chronic Care Management for management of their diabetes?  No  Would the patient like to be referred to a Nutritionist or for Diabetic Management?  No   Diabetic Exams:  Diabetic Eye Exam: Completed .Pt has been advised about the importance in completing this exam. A referral has been placed today. Message sent to referral coordinator for scheduling purposes..  Diabetic Foot Exam:  Pt has been advised about the importance in completing this exam.   Community Resource Referral / Chronic Care Management: CRR required this visit?  No   CCM required this visit?  No     Plan:     I have personally reviewed and noted the following in the patient's chart:   Medical and social history Use of alcohol, tobacco or illicit drugs  Current medications and supplements including opioid prescriptions. Patient is not currently taking opioid prescriptions. Functional ability and status Nutritional status Physical activity Advanced directives List of other physicians Hospitalizations, surgeries, and ER visits in previous 12 months Vitals Screenings to include cognitive, depression, and falls Referrals and appointments  In addition, I have reviewed and discussed with patient certain preventive protocols, quality metrics, and best practice  recommendations. A written personalized care plan for preventive services as well as general preventive health recommendations were provided to patient.     Remi Haggard, LPN   1/61/0960   After Visit Summary: (MyChart) Due to this being a telephonic visit, the after visit summary with patients personalized plan was offered to patient via MyChart   Nurse Notes:

## 2023-02-21 ENCOUNTER — Ambulatory Visit (INDEPENDENT_AMBULATORY_CARE_PROVIDER_SITE_OTHER): Payer: Medicare Other | Admitting: Podiatry

## 2023-02-21 ENCOUNTER — Encounter: Payer: Self-pay | Admitting: Podiatry

## 2023-02-21 DIAGNOSIS — B351 Tinea unguium: Secondary | ICD-10-CM

## 2023-02-21 DIAGNOSIS — M79674 Pain in right toe(s): Secondary | ICD-10-CM

## 2023-02-21 DIAGNOSIS — N1832 Chronic kidney disease, stage 3b: Secondary | ICD-10-CM | POA: Diagnosis not present

## 2023-02-21 DIAGNOSIS — E1122 Type 2 diabetes mellitus with diabetic chronic kidney disease: Secondary | ICD-10-CM | POA: Diagnosis not present

## 2023-02-21 DIAGNOSIS — M79675 Pain in left toe(s): Secondary | ICD-10-CM | POA: Diagnosis not present

## 2023-02-21 NOTE — Progress Notes (Signed)
This patient returns to my office for at risk foot care.  This patient requires this care by a professional since this patient will be at risk due to having diabetes mellitus.  This patient is unable to cut nails herself since the patient cannot reach her nails.These nails are painful walking and wearing shoes.  This patient presents for at risk foot care today.  General Appearance  Alert, conversant and in no acute stress.  Vascular  Dorsalis pedis and posterior tibial  pulses are palpable  bilaterally.  Capillary return is within normal limits  bilaterally. Temperature is within normal limits  bilaterally.  Neurologic  Senn-Weinstein monofilament wire test within normal limits  bilaterally. Muscle power within normal limits bilaterally.  Nails Thick disfigured discolored nails with subungual debris  from hallux to fifth toes bilaterally. No evidence of bacterial infection or drainage bilaterally.  Orthopedic  No limitations of motion  feet .  No crepitus or effusions noted.  No bony pathology or digital deformities noted.  HAV  B/L.  Hammer toes  B/L.  Skin  normotropic skin with no porokeratosis noted bilaterally.  No signs of infections or ulcers noted.     Onychomycosis  Pain in right toes  Pain in left toes  Consent was obtained for treatment procedures.   Mechanical debridement of nails 1-5  bilaterally performed with a nail nipper.  Filed with dremel without incident.    Return office visit   3   mos.                  Told patient to return for periodic foot care and evaluation due to potential at risk complications.   Gardiner Barefoot DPM

## 2023-02-26 DIAGNOSIS — M5416 Radiculopathy, lumbar region: Secondary | ICD-10-CM | POA: Diagnosis not present

## 2023-02-27 ENCOUNTER — Encounter: Payer: Self-pay | Admitting: Family Medicine

## 2023-03-03 ENCOUNTER — Other Ambulatory Visit: Payer: Self-pay | Admitting: Family Medicine

## 2023-03-04 ENCOUNTER — Other Ambulatory Visit: Payer: Self-pay | Admitting: Family Medicine

## 2023-03-07 ENCOUNTER — Encounter: Payer: Self-pay | Admitting: Family Medicine

## 2023-03-07 ENCOUNTER — Ambulatory Visit (INDEPENDENT_AMBULATORY_CARE_PROVIDER_SITE_OTHER): Payer: Medicare Other | Admitting: Family Medicine

## 2023-03-07 VITALS — BP 116/58 | HR 77 | Temp 98.1°F | Resp 18 | Wt 195.1 lb

## 2023-03-07 DIAGNOSIS — Z794 Long term (current) use of insulin: Secondary | ICD-10-CM

## 2023-03-07 DIAGNOSIS — E1165 Type 2 diabetes mellitus with hyperglycemia: Secondary | ICD-10-CM

## 2023-03-07 DIAGNOSIS — Z23 Encounter for immunization: Secondary | ICD-10-CM

## 2023-03-07 LAB — HEMOGLOBIN A1C: Hgb A1c MFr Bld: 8.6 % — ABNORMAL HIGH (ref 4.6–6.5)

## 2023-03-07 LAB — BASIC METABOLIC PANEL
BUN: 35 mg/dL — ABNORMAL HIGH (ref 6–23)
CO2: 25 meq/L (ref 19–32)
Calcium: 9.8 mg/dL (ref 8.4–10.5)
Chloride: 104 meq/L (ref 96–112)
Creatinine, Ser: 1.37 mg/dL — ABNORMAL HIGH (ref 0.40–1.20)
GFR: 39 mL/min — ABNORMAL LOW (ref >60.00)
Glucose, Bld: 199 mg/dL — ABNORMAL HIGH (ref 70–99)
Potassium: 4.5 meq/L (ref 3.5–5.1)
Sodium: 138 meq/L (ref 135–145)

## 2023-03-07 LAB — MICROALBUMIN / CREATININE URINE RATIO
Creatinine,U: 67 mg/dL
Microalb Creat Ratio: 1.5 mg/g (ref 0.0–30.0)
Microalb, Ur: 1 mg/dL (ref 0.0–1.9)

## 2023-03-07 NOTE — Progress Notes (Signed)
   Subjective:    Patient ID: Kayla Brown, female    DOB: August 18, 1950, 72 y.o.   MRN: [id number]  HPI DM- chronic problem, on Farxiga 10mg  daily, Glipizide 10mg  BID, Lantus 12 units nightly, Ozempic 1 mg weekly.  Last A1C 8.3%.  UTD on eye exam, foot exam, microalbumin.  Pt reports sugars have been elevated due to back injections.  CBG this AM 170, monthly avg 150.  Denies increased stress.  No CP, SOB, HA's, visual changes, abd pain, N/V.  Denies symptomatic lows.  No numbness/tingling of hands/feet.   Review of Systems For ROS see HPI     Objective:   Physical Exam Vitals reviewed.  Constitutional:      General: She is not in acute distress.    Appearance: Normal appearance. She is well-developed. She is obese. She is not ill-appearing.  HENT:     Head: Normocephalic and atraumatic.  Eyes:     Conjunctiva/sclera: Conjunctivae normal.     Pupils: Pupils are equal, round, and reactive to light.  Neck:     Thyroid: No thyromegaly.  Cardiovascular:     Rate and Rhythm: Normal rate and regular rhythm.     Pulses: Normal pulses.     Heart sounds: Normal heart sounds. No murmur heard. Pulmonary:     Effort: Pulmonary effort is normal. No respiratory distress.     Breath sounds: Normal breath sounds.  Abdominal:     General: There is no distension.     Palpations: Abdomen is soft.     Tenderness: There is no abdominal tenderness.  Musculoskeletal:     Cervical back: Normal range of motion and neck supple.     Right lower leg: No edema.     Left lower leg: No edema.  Lymphadenopathy:     Cervical: No cervical adenopathy.  Skin:    General: Skin is warm and dry.  Neurological:     General: No focal deficit present.     Mental Status: She is alert and oriented to person, place, and time. Mental status is at baseline.  Psychiatric:        Mood and Affect: Mood normal.        Behavior: Behavior normal.        Thought Content: Thought content normal.            Assessment & Plan:

## 2023-03-07 NOTE — Assessment & Plan Note (Signed)
Chronic problem.  Currently on Farxiga, Glipizide, Lantus, and Ozempic.  Pt admits her diet will ebb and flow- at times she does very well and other times she does not.  UTD on eye exam.  Foot exam and microalbumin are UTD but coming due- done today.  Check labs.  Adjust meds prn

## 2023-03-07 NOTE — Patient Instructions (Signed)
Follow up in 3-4 months to recheck diabetes, blood pressure, cholesterol We'll notify you of your lab results and make any changes if needed Continue to work on low carb/low sugar diet and get regular physical activity- you can do it! Call with any questions or concerns Stay Safe!  Stay Healthy! Happy Early [place] Ouch!!!

## 2023-03-08 ENCOUNTER — Other Ambulatory Visit: Payer: Medicare Other | Admitting: Pharmacist

## 2023-03-08 ENCOUNTER — Encounter: Payer: Self-pay | Admitting: Pharmacist

## 2023-03-08 ENCOUNTER — Telehealth: Payer: Self-pay

## 2023-03-08 ENCOUNTER — Encounter: Payer: Self-pay | Admitting: Family Medicine

## 2023-03-08 DIAGNOSIS — Z794 Long term (current) use of insulin: Secondary | ICD-10-CM

## 2023-03-08 MED ORDER — SEMAGLUTIDE (2 MG/DOSE) 8 MG/3ML ~~LOC~~ SOPN: 2.0000 mg | PEN_INJECTOR | SUBCUTANEOUS | 3 refills | Status: DC

## 2023-03-08 NOTE — Telephone Encounter (Signed)
Pt is aware of the lab results  °

## 2023-03-08 NOTE — Telephone Encounter (Signed)
-----   Message from Kayla Brown sent at 03/08/2023  7:45 AM EDT ----- Kidney function is stable- this is great news!  Your A1C has increased up to 8.6%  Based on this, I would like to increase your Ozempic (semaglutide) to 2mg  weekly.  No other med changes at this time.  Continue to work on low carb, low sugar diet.

## 2023-03-08 NOTE — Progress Notes (Signed)
03/08/2023 Name: Kayla Brown MRN: [id number] DOB: Nov 23, 1950  Chief Complaint  Patient presents with   Medication Management    Kayla Brown is a 72 y.o. year old female who presented for a telephone visit.   They were referred to the pharmacist by their PCP for assistance in managing diabetes and medication access.    Subjective:  Care Team: Primary Care Provider: Sheliah Hatch, MD ; Next Scheduled Visit: 06/11/2023 Cardiologist: Peter Swaziland, MD / Lyman Bishop, NP; Next Scheduled Visit: 03/15/2023 Podiatrist: Dr Helane Gunther; Next Appointment: 05/24/2023  Nephrologist: Dr Signe Colt; Next Appointment: 03/2023 Pulmonologist: Dr Maple Hudson: Next Appointment: 07/08/2023  Medication Access/Adherence  Current Pharmacy:  [id number] [place], [place] - [address] [place] [address] Phone: [phone] Fax: [phone]  [facility] [address], [place] - [address] [place] [address] Phone: [phone] Fax: [phone]   Patient reports affordability concerns with their medications: Yes  Patient reports access/transportation concerns to their pharmacy: No  Patient reports adherence concerns with their medications:  No     Patient received Ozempic from [employer] 01/04/2023  She also received Marcelline Deist from [facility] in 2023 - She states her nephrologist told her that there would be an alternative available that would be covered by her Medicare for 2024 or 2025. Per Mrs. Skogen she received an extra delivery of Farxiga in 2023 and states she still has about 6 bottles on hand.   Diabetes: Current medications:  Ozempic 2mg  weekly (dose was increased this week by Dr Beverely Low due to elevated A1c). Lantus 12 units daily, Farxiga 10mg  daily, glipizide 10mg  twice a day Medications tried in the past: Tradjenta - stopped when changed to Ozempic  Current glucose readings: none  available Denies recent blood glucose < 70 She is checking blood glucose 2 times a day.  Using Freestyle Lite.   Diet:  Bran flakes some mornings to help with constipation.  Trying to eat more vegetables and fruits but she loves potatoes.   Objective:  Lab Results  Component Value Date   HGBA1C 8.6 (H) 03/07/2023    Lab Results  Component Value Date   CREATININE 1.37 (H) 03/07/2023   BUN 35 (H) 03/07/2023   NA 138 03/07/2023   K 4.5 03/07/2023   CL 104 03/07/2023   CO2 25 03/07/2023    Lab Results  Component Value Date   CHOL 131 12/05/2022   HDL 53.80 12/05/2022   LDLCALC 47 12/05/2022   LDLDIRECT 50.0 05/23/2020   TRIG 151.0 (H) 12/05/2022   CHOLHDL 2 12/05/2022    Medications Reviewed Today     Reviewed by Henrene Pastor, RPH-CPP (Pharmacist) on 03/08/23 at 1406  Med List Status: <None>   Medication Order Taking? Sig Documenting Provider Last Dose Status Informant  albuterol (VENTOLIN HFA) 108 (90 Base) MCG/ACT inhaler [id number] No Inhale 1-2 puffs into the lungs every 6 (six) hours as needed for wheezing or shortness of breath. [provider] Taking Active   Alcohol Swabs (ALCOHOL PREP) PADS [id number] No Pt uses an alcohol pad each time sugars are tested. Pt tests twice daily. Dx E11.9 Sheliah Hatch, MD Taking Active Spouse/Significant Other  amLODipine (NORVASC) 10 MG tablet [id number] No Take 1 tablet (10 mg total) by mouth daily. Sheliah Hatch, MD Taking Active   atorvastatin (LIPITOR) 40 MG tablet [id number] No Take 1 tablet (40 mg total) by mouth daily. Sheliah Hatch, MD Taking Active  B Complex Vitamins (B COMPLEX PO) [id number] No Take 1 tablet by mouth daily. [provider] Taking Active   BD PEN NEEDLE NANO 2ND GEN 32G X 4 MM MISC [id number] No 1 APPLICATION BY DOES NOT APPLY ROUTE DAILY. Sheliah Hatch, MD Taking Active   Blood Glucose Monitoring Suppl (FREESTYLE LITE) w/Device KIT [id number] No Use to check blood  glucose up to twice a day (Dx - type 2 DM with long term insulin use - E11.65, Z79.4) Sheliah Hatch, MD Taking Active   buPROPion (WELLBUTRIN XL) 300 MG 24 hr tablet [id number] No TAKE 1 TABLET BY MOUTH EVERY DAY Sheliah Hatch, MD Taking Active   Calcium Carb-Cholecalciferol [phone] MG-UNIT CAPS [id number] No Take 1 tablet by mouth 2 (two) times daily. [provider] Taking Active   carvedilol (COREG) 25 MG tablet [id number] No TAKE 1 TABLET BY MOUTH TWICE A DAY Swaziland, [name], MD Taking Active   dapagliflozin propanediol (FARXIGA) 10 MG TABS tablet [id number] No Take 10 mg by mouth daily. [provider] Taking Active Self           Med Note Windle Guard, JACOB   Wed Jan 25, 2021  9:18 AM) Gets through patient assistance.   ferrous sulfate 324 MG TBEC [id number] No Take 324 mg by mouth. [provider] Taking Active   fish oil-omega-3 fatty acids 1000 MG capsule [id number] No Take 1 g by mouth daily. [provider] Taking Active Spouse/Significant Other  fluticasone (FLONASE) 50 MCG/ACT nasal spray [id number] No SPRAY 2 SPRAYS INTO EACH NOSTRIL EVERY DAY  Patient taking differently: Place 2 sprays into both nostrils daily.   Sheliah Hatch, MD Taking Active   furosemide (LASIX) 20 MG tablet [id number] No TAKE 1 TABLET BY MOUTH EVERY DAY Sheliah Hatch, MD Taking Active   glipiZIDE (GLUCOTROL XL) 10 MG 24 hr tablet [id number] No Take 1 tablet (10 mg total) by mouth 2 (two) times daily with a meal. Sheliah Hatch, MD Taking Active   glucose blood (FREESTYLE LITE) test strip [id number] No Use to check blood glucose up to twice a day (Dx - type 2 DM with long term insulin use - E11.65, Z79.4) Sheliah Hatch, MD Taking Active   insulin glargine (LANTUS SOLOSTAR) 100 UNIT/ML Solostar Pen [id number] No Inject 10 Units into the skin daily.  Patient taking differently: Inject 12 Units into the skin daily.   Sheliah Hatch, MD Taking Active   Lancets  (FREESTYLE) lancets [id number] No Use to check blood glucose up to twice a day (Dx - type 2 DM with long term insulin use - E11.65, Z79.4) Sheliah Hatch, MD Taking Active   loratadine (CLARITIN) 10 MG tablet [id number] No TAKE 1 TABLET BY MOUTH EVERY DAY  Patient taking differently: Take 10 mg by mouth daily.   Sheliah Hatch, MD Taking Active   Magnesium Oxide 420 MG TABS [id number] No Take by mouth. [provider] Taking Active   Melatonin 3 MG TABS [id number] No Take 10 mg by mouth at bedtime.  [provider] Taking Active Spouse/Significant Other  methocarbamol (ROBAXIN) 500 MG tablet [id number] No Take by mouth. [provider] Taking Active   Multiple Vitamins-Minerals (VISION FORMULA) TABS [id number] No Take 1 tablet by mouth daily. [provider] Taking Active Spouse/Significant Other  OVER THE COUNTER MEDICATION [id number] No Take 1 tablet by mouth daily. Equate Vision Formula 50+ [provider] Taking Active Spouse/Significant Other  Semaglutide, 2 MG/DOSE, 8  MG/3ML SOPN [id number]  Inject 2 mg as directed once a week. Sheliah Hatch, MD  Active   sertraline (ZOLOFT) 50 MG tablet [id number] No Take 1 tablet (50 mg total) by mouth daily. Sheliah Hatch, MD Taking Active   traMADol Janean Sark) 50 MG tablet [id number] No Take 1 tablet by mouth every 6 (six) hours as needed. [provider] Taking Active                Assessment/Plan:   Diabetes: Currently not at A1c goal.   - Continue Lantus, Ozempic and Farxiga for diabetes.  - Will updated Rx at [employer] patient assistance program with new Ozempic dose of 2mg  weekly. Should take about 10 to 14 days to arrive. Patient can inject 1mg  x 2 once day per week until she gets new dose.  - Continue to check glucose twice a day.  Discussed Continuous Glucose Monitor in past. Patient is considering.     Follow up planned for October 2024 to start medication assistance  program application for 2025.   Henrene Pastor, PharmD Clinical Pharmacist [facility]

## 2023-03-12 NOTE — Progress Notes (Signed)
Cardiology Clinic Note   Patient Name: Kayla Brown Date of Encounter: 03/15/2023  Primary Care Provider:  Sheliah Hatch, MD Primary Cardiologist:  Peter Swaziland, MD  Patient Profile    72 year old female with chronic diastolic CHF, dyspnea,OSA on CPAP, pulmonary HTN, dyslipidemia, CKD Stage 3 and morbid obesity.  .Last seen by Dr. Swaziland on 09/06/2022, no new testing or medication changes.     Past Medical History    Past Medical History:  Diagnosis Date   Anxiety    Congestive heart failure (CHF) (HCC)    Diabetes mellitus    Edema of extremities 03/02/2016   Essential hypertension, benign    Lazy eye    Major depressive disorder, single episode, unspecified    Obesity (BMI 30-39.9) 03/01/2016   OSA (obstructive sleep apnea)    Pulmonary HTN (HCC)    mild with PASP by echo 05/2016   Pure hypercholesterolemia    Retinopathy, due to hypertension    Past Surgical History:  Procedure Laterality Date   CPAP TITRATION  10/14/2015   DILATION AND CURETTAGE OF UTERUS  2005-2008   x2   EYE SURGERY     cataracts   WISDOM TOOTH EXTRACTION      Allergies  No Known Allergies  History of Present Illness    Kayla Brown comes today without any cardiac complaints.  She has had extra doses of insulin and started which is caused some weight gain.  She is also gone up on her Ozempic dose to 2 mg from 1 mg by her primary care provider.  She has had 2 instances within the last 3 months when she has had to take an extra dose of Lasix for weight gain.  She has had it back injection with steroids within the last month which has helped her with her ambulation.  She believes that she did have COVID a few months ago which was a mild case and recovered quickly.  She denies any new symptoms of shortness of breath, fluid retention, edema, chest pain, or fatigue.  Primary care has recently done labs March 07, 2023.  Creatinine is 1.37.  Home Medications    Current Outpatient  Medications  Medication Sig Dispense Refill   albuterol (VENTOLIN HFA) 108 (90 Base) MCG/ACT inhaler Inhale 1-2 puffs into the lungs every 6 (six) hours as needed for wheezing or shortness of breath.     Alcohol Swabs (ALCOHOL PREP) PADS Pt uses an alcohol pad each time sugars are tested. Pt tests twice daily. Dx E11.9 100 each 3   amLODipine (NORVASC) 10 MG tablet Take 1 tablet (10 mg total) by mouth daily. 90 tablet 1   atorvastatin (LIPITOR) 40 MG tablet Take 1 tablet (40 mg total) by mouth daily. 90 tablet 1   B Complex Vitamins (B COMPLEX PO) Take 1 tablet by mouth daily.     BD PEN NEEDLE NANO 2ND GEN 32G X 4 MM MISC 1 APPLICATION BY DOES NOT APPLY ROUTE DAILY. 100 each 2   Blood Glucose Monitoring Suppl (FREESTYLE LITE) w/Device KIT Use to check blood glucose up to twice a day (Dx - type 2 DM with long term insulin use - E11.65, Z79.4) 1 kit 0   buPROPion (WELLBUTRIN XL) 300 MG 24 hr tablet TAKE 1 TABLET BY MOUTH EVERY DAY 90 tablet 0   Calcium Carb-Cholecalciferol [phone] MG-UNIT CAPS Take 1 tablet by mouth 2 (two) times daily.     carvedilol (COREG) 25 MG tablet TAKE 1  TABLET BY MOUTH TWICE A DAY 180 tablet 2   dapagliflozin propanediol (FARXIGA) 10 MG TABS tablet Take 10 mg by mouth daily.     ferrous sulfate 324 MG TBEC Take 324 mg by mouth.     fish oil-omega-3 fatty acids 1000 MG capsule Take 1 g by mouth daily.     fluticasone (FLONASE) 50 MCG/ACT nasal spray SPRAY 2 SPRAYS INTO EACH NOSTRIL EVERY DAY (Patient taking differently: Place 2 sprays into both nostrils daily.) 18 g 1   furosemide (LASIX) 20 MG tablet TAKE 1 TABLET BY MOUTH EVERY DAY (Patient taking differently: Take 20 mg by mouth daily. Take 1 Tablet Daily. Can Take Additional Tablet for Swelling .) 90 tablet 1   glipiZIDE (GLUCOTROL XL) 10 MG 24 hr tablet Take 1 tablet (10 mg total) by mouth 2 (two) times daily with a meal. 180 tablet 1   glucose blood (FREESTYLE LITE) test strip Use to check blood glucose up to twice a  day (Dx - type 2 DM with long term insulin use - E11.65, Z79.4) 100 each 12   insulin glargine (LANTUS SOLOSTAR) 100 UNIT/ML Solostar Pen Inject 10 Units into the skin daily. (Patient taking differently: Inject 12 Units into the skin daily.) 15 mL 1   Lancets (FREESTYLE) lancets Use to check blood glucose up to twice a day (Dx - type 2 DM with long term insulin use - E11.65, Z79.4) 100 each 12   loratadine (CLARITIN) 10 MG tablet TAKE 1 TABLET BY MOUTH EVERY DAY (Patient taking differently: Take 10 mg by mouth daily.) 30 tablet 11   Magnesium Oxide 420 MG TABS Take by mouth.     Melatonin 3 MG TABS Take 10 mg by mouth at bedtime.      methocarbamol (ROBAXIN) 500 MG tablet Take by mouth.     Multiple Vitamins-Minerals (VISION FORMULA) TABS Take 1 tablet by mouth daily.     OVER THE COUNTER MEDICATION Take 1 tablet by mouth daily. Equate Vision Formula 50+     Semaglutide, 2 MG/DOSE, 8 MG/3ML SOPN Inject 2 mg as directed once a week. 3 mL 3   sertraline (ZOLOFT) 50 MG tablet Take 1 tablet (50 mg total) by mouth daily. 90 tablet 1   traMADol (ULTRAM) 50 MG tablet Take 1 tablet by mouth every 6 (six) hours as needed.     No current facility-administered medications for this visit.     Family History    Family History  Adopted: Yes  Problem Relation Age of Onset   Alcohol abuse Mother    ADD / ADHD Mother    Dementia Mother    Breast cancer Neg Hx    is adopted.   Social History    Social History   Socioeconomic History   Marital status: Married    Spouse name: Cherre Huger   Number of children: Not on file   Years of education: Not on file   Highest education level: Some college, no degree  Occupational History   Occupation: retired  Tobacco Use   Smoking status: Never   Smokeless tobacco: Never  Vaping Use   Vaping status: Never Used  Substance and Sexual Activity   Alcohol use: No    Alcohol/week: 0.0 standard drinks of alcohol   Drug use: No   Sexual activity: Yes     Partners: Male    Birth control/protection: None  Other Topics Concern   Not on file  Social History Narrative   Not on file  Social Determinants of Health   Financial Resource Strain: Low Risk  (02/07/2023)   Overall Financial Resource Strain (CARDIA)    Difficulty of Paying Living Expenses: Not very hard  Food Insecurity: No Food Insecurity (02/07/2023)   Hunger Vital Sign    Worried About Running Out of Food in the Last Year: Never true    Ran Out of Food in the Last Year: Never true  Transportation Needs: No Transportation Needs (02/07/2023)   PRAPARE - Administrator, Civil Service (Medical): No    Lack of Transportation (Non-Medical): No  Physical Activity: Insufficiently Active (02/07/2023)   Exercise Vital Sign    Days of Exercise per Week: 2 days    Minutes of Exercise per Session: 30 min  Stress: No Stress Concern Present (02/07/2023)   [employer] of Occupational Health - Occupational Stress Questionnaire    Feeling of Stress : Not at all  Social Connections: Moderately Integrated (02/07/2023)   Social Connection and Isolation Panel [NHANES]    Frequency of Communication with Friends and Family: Three times a week    Frequency of Social Gatherings with Friends and Family: Once a week    Attends Religious Services: More than 4 times per year    Active Member of [employer] or Organizations: No    Attends Banker Meetings: Never    Marital Status: Married  Catering manager Violence: Not At Risk (02/07/2023)   Humiliation, Afraid, Rape, and Kick questionnaire    Fear of Current or Ex-Partner: No    Emotionally Abused: No    Physically Abused: No    Sexually Abused: No     Review of Systems    General:  No chills, fever, night sweats or weight changes.  Cardiovascular:  No chest pain, dyspnea on exertion, edema, orthopnea, palpitations, paroxysmal nocturnal dyspnea. Dermatological: No rash, lesions/masses Respiratory: No cough,  dyspnea Urologic: No hematuria, dysuria Abdominal:   No nausea, vomiting, diarrhea, bright red blood per rectum, melena, or hematemesis Neurologic:  No visual changes, wkns, changes in mental status. All other systems reviewed and are otherwise negative except as noted above.       Physical Exam    VS:  BP 122/60 (BP Location: Left Arm, Patient Position: Sitting, Cuff Size: Large)   Pulse (!) 56   Ht 4\' 8"  (1.422 m)   Wt 195 lb 3.2 oz (88.5 kg)   LMP 06/25/2008   SpO2 95%   BMI 43.76 kg/m  , BMI Body mass index is 43.76 kg/m.     GEN: Well nourished, well developed, in no acute distress. HEENT: normal.  Nystagmus Neck: Supple, no JVD, carotid bruits, or masses. Cardiac: RRR, distant heart sounds, soft systolic murmurs, rubs, or gallops. No clubbing, cyanosis, edema.  Radials 2+ /DP/PT 1+ and equal bilaterally.  Respiratory:  Respirations regular and unlabored, clear to auscultation bilaterally. GI: Soft, nontender, nondistended, BS + x 4. MS: no deformity or atrophy. Skin: warm and dry, no rash. Neuro:  Strength and sensation are intact. Psych: Normal affect.      Lab Results  Component Value Date   WBC 9.3 12/05/2022   HGB 14.7 12/05/2022   HCT 45.3 12/05/2022   MCV 92.7 12/05/2022   PLT 239.0 12/05/2022   Lab Results  Component Value Date   CREATININE 1.37 (H) 03/07/2023   BUN 35 (H) 03/07/2023   NA 138 03/07/2023   K 4.5 03/07/2023   CL 104 03/07/2023   CO2 25 03/07/2023   Lab  Results  Component Value Date   ALT 33 12/05/2022   AST 20 12/05/2022   ALKPHOS 91 12/05/2022   BILITOT 0.4 12/05/2022   Lab Results  Component Value Date   CHOL 131 12/05/2022   HDL 53.80 12/05/2022   LDLCALC 47 12/05/2022   LDLDIRECT 50.0 05/23/2020   TRIG 151.0 (H) 12/05/2022   CHOLHDL 2 12/05/2022    Lab Results  Component Value Date   HGBA1C 8.6 (H) 03/07/2023     Review of Prior Studies    Myoview 02/02/19:  Study Highlights     Nuclear stress EF: 54%. There  was no ST segment deviation noted during stress. This is a low risk study. The left ventricular ejection fraction is normal (55-65%). The study is normal.   Low risk stress nuclear study with normal perfusion and normal left ventricular regional and global systolic function.   Echocardiogram 03/06/2019 1. The left ventricle has hyperdynamic systolic function, with an  ejection fraction of >65%. The cavity size was normal. There is moderate  concentric left ventricular hypertrophy. Left ventricular diastolic  Doppler parameters are consistent with  impaired relaxation. Elevated mean left atrial pressure No evidence of  left ventricular regional wall motion abnormalities.   2. The right ventricle has normal systolic function. The cavity was  normal. There is no increase in right ventricular wall thickness. Right  ventricular systolic pressure could not be assessed.   3. Left atrial size was mildly dilated.   4. Trivial pericardial effusion is present.   5. The aorta is normal unless otherwise noted.     Assessment & Plan   1.  Chronic diastolic heart failure: Her weight is up from her normal 189 to around 192-193.  She has been gaining some weight with additional use of insulin for poorly controlled blood glucose.  She has been started on Ozempic 2 mg weekly by primary care.  She has had to take additional doses of Lasix twice for weight gain with reduction of weight back down to 189 pounds.  She denies any PND orthopnea or lower extremity edema currently.  Continues on Lasix 20 mg daily and an extra dose as needed for weight gain.  2.  Hypertension: Blood pressure is very well-controlled today on current medication regimen.  She is at goal for a person with diabetes.  Continue amlodipine 10 mg daily as directed.  3.  Type 2 diabetes: Followed by PCP with adjustment of insulin, and additional dosing with higher milligrams of Ozempic.         Signed, Bettey Mare. Liborio Nixon, ANP,  AACC   03/15/2023 9:25 AM      Office [phone] Fax [phone]  Notice: This dictation was prepared with Dragon dictation along with smaller phrase technology. Any transcriptional errors that result from this process are unintentional and may not be corrected upon review.

## 2023-03-13 ENCOUNTER — Telehealth: Payer: Self-pay

## 2023-03-13 NOTE — Telephone Encounter (Signed)
I HAVE SENT DOSE INCREASE FOR OZEMPIC 2MG  PROVIDER PORTION FOR PROVIDER TO FILL OUT AND SEND BACK TO [facility]     PLEASE BE ADVISED PROVIDER OFFICE NEEDS TO SEND BACK TO COMPANY FOR REFILL   [email or web address]

## 2023-03-13 NOTE — Telephone Encounter (Signed)
-----   Message from [place] sent at 03/08/2023  2:14 PM EDT ----- Please start process for new Rx for Ozempic - patient's dose has increased form 1mg  weekly to 2mg  weekly. Thank you!

## 2023-03-14 ENCOUNTER — Telehealth: Payer: Self-pay | Admitting: Family Medicine

## 2023-03-14 NOTE — Telephone Encounter (Signed)
Placed in folder at nurse station

## 2023-03-14 NOTE — Telephone Encounter (Signed)
Received forms from [employer] & placed in provider bin

## 2023-03-15 ENCOUNTER — Ambulatory Visit: Payer: Medicare Other | Attending: Adult Health | Admitting: Adult Health

## 2023-03-15 ENCOUNTER — Encounter: Payer: Self-pay | Admitting: Adult Health

## 2023-03-15 VITALS — BP 122/60 | HR 56 | Ht <= 58 in | Wt 195.2 lb

## 2023-03-15 DIAGNOSIS — Z794 Long term (current) use of insulin: Secondary | ICD-10-CM | POA: Insufficient documentation

## 2023-03-15 DIAGNOSIS — I10 Essential (primary) hypertension: Secondary | ICD-10-CM | POA: Diagnosis present

## 2023-03-15 DIAGNOSIS — E1165 Type 2 diabetes mellitus with hyperglycemia: Secondary | ICD-10-CM | POA: Diagnosis not present

## 2023-03-15 DIAGNOSIS — Z23 Encounter for immunization: Secondary | ICD-10-CM | POA: Diagnosis not present

## 2023-03-15 DIAGNOSIS — R011 Cardiac murmur, unspecified: Secondary | ICD-10-CM | POA: Diagnosis not present

## 2023-03-15 DIAGNOSIS — I5032 Chronic diastolic (congestive) heart failure: Secondary | ICD-10-CM

## 2023-03-15 NOTE — Patient Instructions (Signed)
Medication Instructions:  No changes *If you need a refill on your cardiac medications before your next appointment, please call your pharmacy*   Lab Work: No labs If you have labs (blood work) drawn today and your tests are completely normal, you will receive your results only by: MyChart Message (if you have MyChart) OR A paper copy in the mail If you have any lab test that is abnormal or we need to change your treatment, we will call you to review the results.   Testing/Procedures: No Testing   Follow-Up: At [facility], you and your health needs are our priority.  As part of our continuing mission to provide you with exceptional heart care, we have created designated Provider Care Teams.  These Care Teams include your primary Cardiologist (physician) and Advanced Practice Providers (APPs -  Physician Assistants and Nurse Practitioners) who all work together to provide you with the care you need, when you need it.  We recommend signing up for the patient portal called "MyChart".  Sign up information is provided on this After Visit Summary.  MyChart is used to connect with patients for Virtual Visits (Telemedicine).  Patients are able to view lab/test results, encounter notes, upcoming appointments, etc.  Non-urgent messages can be sent to your provider as well.   To learn more about what you can do with MyChart, go to [email or web address].    Your next appointment:   6 month(s)  Provider:   Joni Reining, DNP, ANP

## 2023-03-19 ENCOUNTER — Telehealth: Payer: Self-pay

## 2023-03-19 NOTE — Telephone Encounter (Signed)
Called patient had to LM about medication pick up. There has been an order received of 3 boxes of Ozempic 1 mg doses placed in the refrigerator on A side

## 2023-03-20 ENCOUNTER — Telehealth: Payer: Self-pay | Admitting: Family Medicine

## 2023-03-20 NOTE — Telephone Encounter (Signed)
Kayla Brown came in to pick up her Ozempic order

## 2023-03-21 NOTE — Telephone Encounter (Signed)
Form completed and returned to [employer]

## 2023-03-27 ENCOUNTER — Other Ambulatory Visit: Payer: Self-pay | Admitting: Family Medicine

## 2023-03-27 DIAGNOSIS — F32A Depression, unspecified: Secondary | ICD-10-CM

## 2023-03-27 DIAGNOSIS — E1165 Type 2 diabetes mellitus with hyperglycemia: Secondary | ICD-10-CM

## 2023-04-04 ENCOUNTER — Telehealth: Payer: Self-pay

## 2023-04-04 NOTE — Telephone Encounter (Signed)
Noted! Thank you

## 2023-04-04 NOTE — Telephone Encounter (Signed)
Reached out to pt via Mychart about patient assistance renewal for 2025

## 2023-04-11 ENCOUNTER — Other Ambulatory Visit: Payer: Self-pay | Admitting: Pharmacist

## 2023-04-11 DIAGNOSIS — F419 Anxiety disorder, unspecified: Secondary | ICD-10-CM

## 2023-04-11 DIAGNOSIS — E1165 Type 2 diabetes mellitus with hyperglycemia: Secondary | ICD-10-CM

## 2023-04-11 MED ORDER — SERTRALINE HCL 50 MG PO TABS
50.0000 mg | ORAL_TABLET | Freq: Every day | ORAL | 1 refills | Status: DC
Start: 2023-04-11 — End: 2023-11-25

## 2023-04-11 MED ORDER — GLIPIZIDE ER 10 MG PO TB24
10.0000 mg | ORAL_TABLET | Freq: Two times a day (BID) | ORAL | 1 refills | Status: DC
Start: 2023-04-11 — End: 2023-08-26

## 2023-04-11 NOTE — Telephone Encounter (Signed)
Patient reports that she has had difficulty getting a refill for gl[facility]ide and [facility]   Reviewed [facility]ill records. [facility] appears that [facility] might [facility]la [facility]'s [facility] with possible sync date of 06/26/2023.  Glipizide was filled 9/22 for #22 tabs = 11 day supply - she should have run out around 03/28/2023 Sertraline was filled 9/16 for #17 tablet = 17 day supply - also should have run out around 03/28/2023  Due to filling < 90 DS patient does not have a full prescription of 90 DS left for either of these medications.   Will request updated Rx from Dr Beverely Low

## 2023-04-11 NOTE — Progress Notes (Signed)
04/11/2023 Name: Kayla Brown MRN: [id number] DOB: May 03, 1951  Chief Complaint  Patient presents with   Medication Management    Medication cost    Kayla Brown is a 72 y.o. year old female who presented for a telephone visit.   They were referred to the pharmacist by their PCP for assistance in managing diabetes and medication access.    Subjective:  Care Team: Primary Care Provider: Sheliah Hatch, MD ; Next Scheduled Visit: 06/11/2023 Cardiologist: Kayla Swaziland, MD / Kayla Bishop, NP; Next Scheduled Visit:  - recall set for 08/2023 Podiatrist: Dr Kayla Brown; Next Appointment: 05/24/2023  Nephrologist: Dr Kayla Brown; Next Appointment: 05/2023 Pulmonologist: Dr Kayla Brown: Next Appointment: 07/08/2023  Medication Access/Adherence  Current Pharmacy:  [id number] [place], [place] - [address] [place] [address] Phone: [phone] Fax: [phone]  [facility] [address] [place] - [address] [place] [address] Phone: [phone] Fax: [phone]   Patient reports affordability concerns with their medications: Yes  Patient reports access/transportation concerns to their pharmacy: No  Patient reports adherence concerns with their medications:  No     Patient received Ozempic from Kayla Brown recently but was 1mg  strength. She is now taking Ozempic 2mg  weekly (for now she is injecting 1mg  twice each week). Patient received a letter from Kayla Brown dated 03/25/2023 - the letter states that the most recent prescription received by Kayla Brown medication assistance Brown was missing provider fax number or was illegible.   She also received Kayla Brown from Kayla Brown and Kayla Brown in 2023 - She states her nephrologist told her that there would be an alternative available that would be covered by her Medicare for 2024 or 2025. Per Kayla Brown she received an extra delivery of Farxiga in 2023  and states she still has 3 bottles on hand.   Patient also reports that she has had difficulty getting a refill for [facility] and [facility]  Reviewed refill records. It appears that [facility] might be trying to [facility] Kayla Brown's medications with possible sync date of 06/26/2023.  Glipizide was filled 9/22 for #22 tabs = 11 day supply - she should have run out around 03/28/2023 Sertraline was filled 9/16 for #17 tablet = 17 day supply - also should have run out around 03/28/2023 Due to filling < 90 DS patient does not have a full prescription of 90 DS left for either of these medications.   Kayla Brown also had several questions about 2025 Medicare changes and about why her Part D Kayla Brown plan cost was going up.   Diabetes: Current medications:  Ozempic 2mg  weekly (dose was increased by Dr Kayla Brown 02/2023 due to elevated A1c). Lantus 12 units daily, Farxiga 10mg  daily, glipizide 10mg  twice a day Medications tried in the past: Tradjenta - stopped when changed to Ozempic  Current glucose readings: no exact numbers provided today but patient denies blood glucose > 200 or < 70 She is checking blood glucose 2 times a day.  Using Freestyle Lite.   Diet:  Bran flakes some mornings to help with constipation.  Trying to eat more vegetables and fruits but she loves potatoes.   Objective:  Lab Results  Component Value Date   HGBA1C 8.6 (H) 03/07/2023    Lab Results  Component Value Date   CREATININE 1.37 (H) 03/07/2023   BUN 35 (H) 03/07/2023   NA 138 03/07/2023   K 4.5 03/07/2023   CL 104 03/07/2023   CO2  25 03/07/2023    Lab Results  Component Value Date   CHOL 131 12/05/2022   HDL 53.80 12/05/2022   LDLCALC 47 12/05/2022   LDLDIRECT 50.0 05/23/2020   TRIG 151.0 (H) 12/05/2022   CHOLHDL 2 12/05/2022    Medications Reviewed Today     Reviewed by Kayla Brown, Kayla Brown (Pharmacist) on 04/11/23 at 1138  Med List Status: <None>   Medication Order Taking? Sig Documenting Provider  Last Dose Status Informant  albuterol (VENTOLIN HFA) 108 (90 Base) MCG/ACT inhaler [id number] Yes Inhale 1-2 puffs into the lungs every 6 (six) hours as needed for wheezing or shortness of breath. [provider] Taking Active   Alcohol Swabs (ALCOHOL PREP) PADS [id number] Yes Pt uses an alcohol pad each time sugars are tested. Pt tests twice daily. Dx E11.9 Kayla Hatch, MD Taking Active Spouse/Significant Other  amLODipine (NORVASC) 10 MG tablet [id number] Yes Take 1 tablet (10 mg total) by mouth daily. Kayla Hatch, MD Taking Active   atorvastatin (LIPITOR) 40 MG tablet [id number] Yes Take 1 tablet (40 mg total) by mouth daily. Kayla Hatch, MD Taking Active   B Complex Vitamins (B COMPLEX PO) [id number]  Take 1 tablet by mouth daily. [provider]  Active   BD PEN NEEDLE NANO 2ND GEN 32G X 4 MM MISC [id number] Yes 1 APPLICATION BY DOES NOT APPLY ROUTE DAILY. Kayla Hatch, MD Taking Active   Blood Glucose Monitoring Suppl (FREESTYLE LITE) w/Device KIT [id number] Yes Use to check blood glucose up to twice a day (Dx - type 2 DM with long term insulin use - E11.65, Z79.4) Kayla Hatch, MD Taking Active   buPROPion (WELLBUTRIN XL) 300 MG 24 hr tablet [id number] Yes TAKE 1 TABLET BY MOUTH EVERY DAY Kayla Hatch, MD Taking Active   Calcium Carb-Cholecalciferol [phone] MG-UNIT CAPS [id number]  Take 1 tablet by mouth 2 (two) times daily. [provider]  Active   carvedilol (COREG) 25 MG tablet [id number] Yes TAKE 1 TABLET BY MOUTH TWICE A DAY Brown, Kayla M, MD Taking Active   dapagliflozin propanediol (FARXIGA) 10 MG TABS tablet [id number] Yes Take 10 mg by mouth daily. [provider] Taking Active Self           Med Note Kayla Brown, Kayla Brown   Wed Jan 25, 2021  9:18 AM) Gets through patient assistance.   ferrous sulfate 324 MG TBEC [id number] Yes Take 324 mg by mouth. [provider] Taking Active   fish oil-omega-3 fatty acids 1000  MG capsule [id number]  Take 1 g by mouth daily. [provider]  Active Spouse/Significant Other  fluticasone (FLONASE) 50 MCG/ACT nasal spray [id number] Yes SPRAY 2 SPRAYS INTO EACH NOSTRIL EVERY DAY  Patient taking differently: Place 2 sprays into both nostrils daily.   Kayla Hatch, MD Taking Active   furosemide (LASIX) 20 MG tablet [id number] Yes TAKE 1 TABLET BY MOUTH EVERY DAY  Patient taking differently: Take 20 mg by mouth daily. Take 1 Tablet Daily. Can Take Additional Tablet for Swelling .   Kayla Hatch, MD Taking Active   glipiZIDE (GLUCOTROL XL) 10 MG 24 hr tablet [id number] Yes Take 1 tablet (10 mg total) by mouth 2 (two) times daily with a meal. Kayla Hatch, MD Taking Active   glucose blood (FREESTYLE LITE) test strip [id number] Yes Use to check blood glucose up to twice a day (Dx - type 2 DM with long term insulin use - E11.65, Z79.4) Kayla Hatch,  MD Taking Active   insulin glargine (LANTUS SOLOSTAR) 100 UNIT/ML Solostar Pen [id number] Yes Inject 10 Units into the skin daily.  Patient taking differently: Inject 12 Units into the skin daily.   Kayla Hatch, MD Taking Active   Lancets (FREESTYLE) lancets [id number] Yes Use to check blood glucose up to twice a day (Dx - type 2 DM with long term insulin use - E11.65, Z79.4) Kayla Hatch, MD Taking Active   loratadine (CLARITIN) 10 MG tablet [id number] Yes TAKE 1 TABLET BY MOUTH EVERY DAY  Patient taking differently: Take 10 mg by mouth daily.   Kayla Hatch, MD Taking Active   Magnesium Oxide 420 MG TABS [id number] Yes Take by mouth. [provider] Taking Active   Melatonin 3 MG TABS [id number]  Take 10 mg by mouth at bedtime.  [provider]  Active Spouse/Significant Other  methocarbamol (ROBAXIN) 500 MG tablet [id number]  Take by mouth. [provider]  Active   Multiple Vitamins-Minerals (VISION FORMULA) TABS [id number]  Take 1 tablet by mouth daily.  [provider]  Active Spouse/Significant Other  OVER THE COUNTER MEDICATION [id number] Yes Take 1 tablet by mouth daily. Equate Vision Formula 50+ [provider] Taking Active Spouse/Significant Other  Semaglutide, 2 MG/DOSE, 8 MG/3ML SOPN [id number] Yes Inject 2 mg as directed once a week. Kayla Hatch, MD Taking Active            Med Note Clydie Braun, Cindie Laroche Apr 11, 2023 11:19 AM) Approved thru Novo Nordisk medication assistance Brown thru 06/25/2023  sertraline (ZOLOFT) 50 MG tablet [id number] Yes Take 1 tablet (50 mg total) by mouth daily. Kayla Hatch, MD Taking Active   traMADol Janean Sark) 50 MG tablet [id number]  Take 1 tablet by mouth every 6 (six) hours as needed. [provider]  Active               Assessment/Plan:   Diabetes: Currently not at A1c goal.   - Continue Lantus, Ozempic and Farxiga for diabetes.  - Will have Med Assist Team check on Rx for Ozempic 2mg  and provide Kayla Brown any missing information. - Also request 2025 Ozempic / Novo NOrdisk application be mailed to patient.  - Screened patient for LIS / Extra Help - she did not qualify based on 2025 income and asset limints.  - discussed starting 2025 medication assistance Brown application for Farxiga and Lantus. Mrs. Eastman said she would like to hold off on Farxiga medication assistance Brown because she thinks that Dr Kayla Brown is planning to change to something in 2025 that will be less expensive (I think she is possible thinking of generic Kayla Brown / dapagliflozin but if patient continues with same Part D plan - dapagliflozin is not on formulary. Patient will let Kayla know if she later would like to start medication assistance Brown process.  - Mrs Bellus states her Lantus has been $35 per fill. She is OK with this cost and declined to apply for medication assistance Brown for Lantus. . - Continue to check glucose twice a day.  Discussed Continuous Glucose Monitor in  past. Patient is considering.   Medication Management:  - will reach out to Dr Kayla Brown regarding RF for glipizide and sertraline.  - Provided patient with number to [facility] office and also a [employer] broker near her that can review 2025 Medicare plans with her and help her pick one that will be the best fit for her.  Follow up planned for December 2024   Kayla Brown, PharmD Clinical Pharmacist [facility]

## 2023-05-06 ENCOUNTER — Telehealth: Payer: Self-pay

## 2023-05-06 NOTE — Progress Notes (Signed)
FAXED TO PROVIDER OFFICE  for DOSE INCREASE FOR OZEMPIC(NOVO NORDISK) PROVIDER OFFICE NEED TO SEND TO COMPANY.     Pharmacy Medication Assistance Program Note    05/06/2023  Patient ID: KEYASIA KRUTH, female   DOB: [birth year], 72 y.o.   MRN: [id number]     05/06/2023  Outreach Medication One  Initial Outreach Date (Medication One) 05/06/2023  Manufacturer Medication One [employer] Drugs Ozempic  Dose of Ozempic 2MG   Type of Radiographer, therapeutic Assistance  Date Application Sent to Patient 05/06/2023  Date Application Sent to Prescriber 05/06/2023  Name of Prescriber KATHERINE [name]       Signature Melanee Spry CPhT Rx Patient [facility] [phone]

## 2023-05-06 NOTE — Telephone Encounter (Signed)
-----   Message from Henrene Pastor sent at 04/19/2023  3:48 PM EDT ----- I have verified with patient and with Novo - there is no 2mg  dose on file. Can you resent to office? ----- Message ----- From: Earnest Conroy, CPhT Sent: 04/19/2023   7:03 AM EDT To: Henrene Pastor, RPH-CPP  I sent the provider page the providers office on 03/13/2023 you may want to reach out to the provider office. For increase dosing we have been asking the office to not send back to [place] and to send back to company.  Thanks you! ----- Message ----- From: Henrene Pastor, RPH-CPP Sent: 04/11/2023   1:22 PM EDT To: Earnest Conroy, CPhT  Can you check on updated Ozempic dose that was sent to Novo in September? Patient received letter that the provider fax number was missing or illegible.

## 2023-05-09 ENCOUNTER — Encounter: Payer: Self-pay | Admitting: Podiatry

## 2023-05-14 NOTE — Telephone Encounter (Signed)
Checking with PCP office to see if dose change paperwork sent by Med Assist team 05/06/2023 was received and returned to Nov[employer] medication assistance program. Sent message to PCP office - awaiting reply.

## 2023-05-15 ENCOUNTER — Telehealth: Payer: Self-pay | Admitting: Family Medicine

## 2023-05-15 NOTE — Telephone Encounter (Signed)
Type of form received: Patient Assistance Program Assistance Request  Additional comments: Regarding dose increase  Received by: [employer]  Form should be Faxed/mailed to: (address/ fax #) [phone]  Is patient requesting call for pickup: No  Form placed:  Labeled & placed in provider bin  Attach charge sheet.  Provider will determine charge.  Individual made aware of 3-5 business day turn around? No

## 2023-05-15 NOTE — Telephone Encounter (Signed)
Caller name: MAKIAYA OFLAHERTY  On [place]?: Yes  Call back number: [phone]  Provider they see: Sheliah Hatch, MD  Reason for call: [name] would like to discuss her Ozempic with you when you have a moment

## 2023-05-15 NOTE — Telephone Encounter (Signed)
Placed in folder at nurse station

## 2023-05-16 NOTE — Telephone Encounter (Signed)
Patient's dose for Ozempic was increased to 2mg  weekly. Her initial paperwork for the dose increase thru Novo medication assistance program was lost but has been resent to PCP's office yesterday. There is a phone message indicating that paperwork has been received by PCP office. Patient states she has 1 dose remaining for this Saturday 11/23. I worry that even if we are able to submit dose increase today that she will not receive next shipment due to upcoming Thanksgiving holiday. I will check to see if PCP office might have a sample - though it would be a lower dose.

## 2023-05-21 ENCOUNTER — Other Ambulatory Visit: Payer: Self-pay | Admitting: Pharmacist

## 2023-05-21 NOTE — Telephone Encounter (Signed)
Provided patient with 1 pen of Ozempic 0.25/0.5mg  dose. She will continue with 1mg  weekly - will need to inject 0.5mg  2 times.  Also provided patient with 2025 application for Ozempic.

## 2023-05-21 NOTE — Telephone Encounter (Signed)
Ozempic 0.25 / 0.5mg  # 1 pen - Lot: [id number] / Exp: 01/23/2024

## 2023-05-22 ENCOUNTER — Ambulatory Visit: Payer: Medicare Other | Admitting: Podiatry

## 2023-05-22 ENCOUNTER — Encounter: Payer: Self-pay | Admitting: Podiatry

## 2023-05-22 DIAGNOSIS — N1832 Chronic kidney disease, stage 3b: Secondary | ICD-10-CM

## 2023-05-22 DIAGNOSIS — M79674 Pain in right toe(s): Secondary | ICD-10-CM | POA: Diagnosis not present

## 2023-05-22 DIAGNOSIS — B351 Tinea unguium: Secondary | ICD-10-CM | POA: Diagnosis not present

## 2023-05-22 DIAGNOSIS — E1122 Type 2 diabetes mellitus with diabetic chronic kidney disease: Secondary | ICD-10-CM

## 2023-05-22 DIAGNOSIS — M79675 Pain in left toe(s): Secondary | ICD-10-CM

## 2023-05-22 NOTE — Telephone Encounter (Signed)
Form completed and returned to [place]

## 2023-05-22 NOTE — Progress Notes (Signed)
This patient returns to my office for at risk foot care.  This patient requires this care by a professional since this patient will be at risk due to having diabetes mellitus.  This patient is unable to cut nails herself since the patient cannot reach her nails.These nails are painful walking and wearing shoes.  This patient presents for at risk foot care today.  General Appearance  Alert, conversant and in no acute stress.  Vascular  Dorsalis pedis and posterior tibial  pulses are palpable  bilaterally.  Capillary return is within normal limits  bilaterally. Temperature is within normal limits  bilaterally.  Neurologic  Senn-Weinstein monofilament wire test within normal limits  bilaterally. Muscle power within normal limits bilaterally.  Nails Thick disfigured discolored nails with subungual debris  from hallux to fifth toes bilaterally. No evidence of bacterial infection or drainage bilaterally.  Orthopedic  No limitations of motion  feet .  No crepitus or effusions noted.  No bony pathology or digital deformities noted.  HAV  B/L.  Hammer toes  B/L.  Skin  normotropic skin with no porokeratosis noted bilaterally.  No signs of infections or ulcers noted.     Onychomycosis  Pain in right toes  Pain in left toes  Consent was obtained for treatment procedures.   Mechanical debridement of nails 1-5  bilaterally performed with a nail nipper.  Filed with dremel without incident.    Return office visit   3   mos.                  Told patient to return for periodic foot care and evaluation due to potential at risk complications.   Helane Gunther DPM

## 2023-05-22 NOTE — Telephone Encounter (Signed)
Faxed-placed in scan folder

## 2023-05-24 ENCOUNTER — Ambulatory Visit: Payer: Medicare Other | Admitting: Podiatry

## 2023-05-27 ENCOUNTER — Telehealth: Payer: Self-pay | Admitting: Family Medicine

## 2023-05-27 NOTE — Telephone Encounter (Signed)
In front bin for [employer]

## 2023-05-27 NOTE — Telephone Encounter (Signed)
Type of form received:Novo Nordisk   Additional comments: pt stated she didn't need copies.   Received JX:[id number] - Front Desk   Form should be Faxed/mailed to: Tammy Eckard  Is patient requesting call for pickup:N/A  Form placed:  In front bin in a folder for Tammy   Attach charge sheet.  Provider will determine charge. N/A  Individual made aware of 3-5 business day turn around Yes?

## 2023-05-30 ENCOUNTER — Telehealth: Payer: Self-pay

## 2023-05-30 NOTE — Progress Notes (Signed)
Pharmacy Medication Assistance Program Note    05/30/2023  Patient ID: Kayla Brown, female   DOB: 06-Jun-1951, 72 y.o.   MRN: [id number]     05/06/2023 05/30/2023  Outreach Medication One  Initial Outreach Date (Medication One) 05/06/2023   Manufacturer Medication One [employer] Drugs Ozempic   Dose of Ozempic 2MG  2MG   Type of Forensic scientist Assistance  Date Application Sent to Patient 05/06/2023   Date Application Sent to Prescriber 05/06/2023 05/07/2023  Name of Prescriber Juel Burrow  Date Application Received From Provider  05/22/2023  Date Application Submitted to Manufacturer  05/30/2023  Method Application Sent to Manufacturer  Fax       Signature

## 2023-05-30 NOTE — Telephone Encounter (Signed)
PAP: Application for Ozempic has been submitted to PAP Companies: NovoNordisk, via fax   PLEASE BE ADVISED RECEIVED PROVIDER PAGES FOR DOSE INCREASE TO OZEMPIC 2MG   AND SENT TO NOVO NORDISK

## 2023-06-04 ENCOUNTER — Other Ambulatory Visit: Payer: Self-pay | Admitting: Pharmacist

## 2023-06-04 NOTE — Progress Notes (Unsigned)
06/04/2023 Name: Kayla Brown MRN: [id number] DOB: 09-Aug-1950  Chief Complaint  Patient presents with   Diabetes    Follow up    Kayla Brown is a 72 y.o. year old female who presented for a telephone visit.   They were referred to the pharmacist by their PCP for assistance in managing diabetes and medication access.    Subjective:  Care Team: Primary Care Provider: Sheliah Hatch, MD ; Next Scheduled Visit: 06/11/2023 Cardiologist: Peter Swaziland, MD / Lyman Bishop, NP; Next Scheduled Visit:  - recall set for 08/2023 Podiatrist: Dr Helane Gunther; Next Appointment: 08/22/2023  Nephrologist: Dr Signe Colt; Next Appointment: 05/2023 Pulmonologist: Dr Maple Hudson: Next Appointment: 07/08/2023  Medication Access/Adherence  Current Pharmacy:  [id number] [place] [facility], [place] - [address] [place] [address] Phone: [phone] Fax: [phone]  [facility] [address] [place] - [address] [place] [address] Phone: [phone] Fax: [phone]   Patient reports affordability concerns with their medications: Yes  Patient reports access/transportation concerns to their pharmacy: No  Patient reports adherence concerns with their medications:  No     Patient received Ozempic from [employer] in 2024 but only the 1mg  strength. She was now taking Ozempic 2mg  weekly (for now she is injecting 1mg  twice each week) but unable to continue to double the dose because she would run out of Ozempic before [employer] will send next shipment. She has reached her max number of refills/ shipments that Novo allows during a year - (3 shipments x 4 months each)  She also received Marcelline Deist from [facility] and Me Program in 2023 - She states her nephrologist told her that there would be an alternative available that would be covered by her Medicare for 2024 or 2025. Per Kayla Brown she received an extra delivery of Farxiga in  2023 and states she still has 2 bottles of Farxiga on hand.   Diabetes: Current medications:  Lantus 12 units daily, Farxiga 10mg  daily, glipizide 10mg  twice a day, Ozempic 1mg  weekly  - she was suppose to increase to Ozempic 2mg  weekly but was unable to get the higher dose thru [employer] patient assistance. She has continued to use 1mg  weekly and will be able to get new 2mg  dose in 2025. (Patient had reached maximum number of fills for Ozempic for the 2024 medication assistance program)   Medications tried in the past: Tradjenta - stopped when changed to Ozempic  She is checking blood glucose. Using Freestyle Lite.  Lowest blood glucose has been 91 and highest 180.   Diet:  Bran flakes some mornings to help with constipation.  Trying to eat more vegetables and fruits but she loves potatoes.   Objective:  Lab Results  Component Value Date   HGBA1C 8.6 (H) 03/07/2023    Lab Results  Component Value Date   CREATININE 1.37 (H) 03/07/2023   BUN 35 (H) 03/07/2023   NA 138 03/07/2023   K 4.5 03/07/2023   CL 104 03/07/2023   CO2 25 03/07/2023    Lab Results  Component Value Date   CHOL 131 12/05/2022   HDL 53.80 12/05/2022   LDLCALC 47 12/05/2022   LDLDIRECT 50.0 05/23/2020   TRIG 151.0 (H) 12/05/2022   CHOLHDL 2 12/05/2022    Medications Reviewed Today     Reviewed by Henrene Pastor, RPH-CPP (Pharmacist) on 06/04/23 at 1025  Med List Status: <None>   Medication Order Taking? Sig Documenting Provider Last Dose Status  Informant  albuterol (VENTOLIN HFA) 108 (90 Base) MCG/ACT inhaler [id number] No Inhale 1-2 puffs into the lungs every 6 (six) hours as needed for wheezing or shortness of breath. [provider] Taking Active   Alcohol Swabs (ALCOHOL PREP) PADS [id number] No Pt uses an alcohol pad each time sugars are tested. Pt tests twice daily. Dx E11.9 Sheliah Hatch, MD Taking Active Spouse/Significant Other  amLODipine (NORVASC) 10 MG tablet [id number] No Take  1 tablet (10 mg total) by mouth daily. Sheliah Hatch, MD Taking Active   atorvastatin (LIPITOR) 40 MG tablet [id number] No Take 1 tablet (40 mg total) by mouth daily. Sheliah Hatch, MD Taking Active   B Complex Vitamins (B COMPLEX PO) [id number] No Take 1 tablet by mouth daily. [provider] Taking Active   BD PEN NEEDLE NANO 2ND GEN 32G X 4 MM MISC [id number] No 1 APPLICATION BY DOES NOT APPLY ROUTE DAILY. Sheliah Hatch, MD Taking Active   Blood Glucose Monitoring Suppl (FREESTYLE LITE) w/Device KIT [id number] No Use to check blood glucose up to twice a day (Dx - type 2 DM with long term insulin use - E11.65, Z79.4) Sheliah Hatch, MD Taking Active   buPROPion (WELLBUTRIN XL) 300 MG 24 hr tablet [id number] No TAKE 1 TABLET BY MOUTH EVERY DAY Sheliah Hatch, MD Taking Active   Calcium Carb-Cholecalciferol [phone] MG-UNIT CAPS [id number] No Take 1 tablet by mouth 2 (two) times daily. [provider] Taking Active   carvedilol (COREG) 25 MG tablet [id number] No TAKE 1 TABLET BY MOUTH TWICE A DAY Swaziland, [name], MD Taking Active   dapagliflozin propanediol (FARXIGA) 10 MG TABS tablet [id number] No Take 10 mg by mouth daily. [provider] Taking Active Self           Med Note Brown Guard, Kayla   Wed Jan 25, 2021  9:18 AM) Gets through patient assistance.   ferrous sulfate 324 MG TBEC [id number] No Take 324 mg by mouth. [provider] Taking Active   fish oil-omega-3 fatty acids 1000 MG capsule [id number] No Take 1 g by mouth daily. [provider] Taking Active Spouse/Significant Other  fluticasone (FLONASE) 50 MCG/ACT nasal spray [id number] No SPRAY 2 SPRAYS INTO EACH NOSTRIL EVERY DAY  Patient taking differently: Place 2 sprays into both nostrils daily.   Sheliah Hatch, MD Taking Active   furosemide (LASIX) 20 MG tablet [id number] No TAKE 1 TABLET BY MOUTH EVERY DAY  Patient taking differently: Take 20 mg by mouth daily. Take 1 Tablet  Daily. Can Take Additional Tablet for Swelling .   Sheliah Hatch, MD Taking Active   glipiZIDE (GLUCOTROL XL) 10 MG 24 hr tablet [id number] No Take 1 tablet (10 mg total) by mouth 2 (two) times daily with a meal. Sheliah Hatch, MD Taking Active   glucose blood (FREESTYLE LITE) test strip [id number] No Use to check blood glucose up to twice a day (Dx - type 2 DM with long term insulin use - E11.65, Z79.4) Sheliah Hatch, MD Taking Active   insulin glargine (LANTUS SOLOSTAR) 100 UNIT/ML Solostar Pen [id number] No Inject 10 Units into the skin daily.  Patient taking differently: Inject 12 Units into the skin daily.   Sheliah Hatch, MD Taking Active   Lancets (FREESTYLE) lancets [id number] No Use to check blood glucose up to twice a day (Dx - type 2 DM with long term insulin use - E11.65, Z79.4) Sheliah Hatch, MD Taking Active  loratadine (CLARITIN) 10 MG tablet [id number] No TAKE 1 TABLET BY MOUTH EVERY DAY  Patient taking differently: Take 10 mg by mouth daily.   Sheliah Hatch, MD Taking Active   Magnesium Oxide 420 MG TABS [id number] No Take by mouth. [provider] Taking Active   Melatonin 3 MG TABS [id number] No Take 10 mg by mouth at bedtime.  [provider] Taking Active Spouse/Significant Other  methocarbamol (ROBAXIN) 500 MG tablet [id number] No Take by mouth. [provider] Taking Active   Multiple Vitamins-Minerals (VISION FORMULA) TABS [id number] No Take 1 tablet by mouth daily. [provider] Taking Active Spouse/Significant Other  OVER THE COUNTER MEDICATION [id number] No Take 1 tablet by mouth daily. Equate Vision Formula 50+ [provider] Taking Active Spouse/Significant Other  Semaglutide, 2 MG/DOSE, 8 MG/3ML SOPN [id number] No Inject 2 mg as directed once a week.  Patient taking differently: Inject 1 mg as directed once a week.   Sheliah Hatch, MD Taking Active            Med Note Clydie Braun, Cindie Laroche Apr 11, 2023 11:19 AM) Approved thru Novo Nordisk medication assistance program thru 06/25/2023  sertraline (ZOLOFT) 50 MG tablet [id number] No Take 1 tablet (50 mg total) by mouth daily. Sheliah Hatch, MD Taking Active   traMADol Janean Sark) 50 MG tablet [id number] No Take 1 tablet by mouth every 6 (six) hours as needed. [provider] Taking Active               Assessment/Plan:   Diabetes: Currently not at A1c goal.   - Continue Lantus, Ozempic and Farxiga for diabetes.  - Mrs Faris states her Lantus has been $35 per fill. She is OK with this cost and declined to apply for medication assistance program for Lantus. . - Continue to check glucose twice a day.  Discussed Continuous Glucose Monitor in past. Patient is considering.       Follow up planned for January 2025  Henrene Pastor, PharmD Clinical Pharmacist [facility]

## 2023-06-06 ENCOUNTER — Other Ambulatory Visit: Payer: Self-pay | Admitting: Family Medicine

## 2023-06-06 ENCOUNTER — Encounter: Payer: Self-pay | Admitting: Pharmacist

## 2023-06-06 NOTE — Progress Notes (Addendum)
06/06/2023 Name: Kayla Brown MRN: [id number] DOB: 19-Feb-1951  Chief Complaint  Patient presents with   Medication Management    Kayla Brown is a 72 y.o. year old female. She has returned [employer] application to office.    They were referred to the pharmacist by their PCP for assistance in managing diabetes and medication access.    Subjective:  Care Team: Primary Care Provider: Sheliah Hatch, MD ; Next Scheduled Visit: 06/11/2023 Cardiologist: Peter Swaziland, MD / Lyman Bishop, NP; Next Scheduled Visit:  - recall set for 08/2023 Podiatrist: Dr Helane Gunther; Next Appointment: 08/22/2023  Nephrologist: Dr Signe Colt; Next Appointment: 05/2023 Pulmonologist: Dr Maple Hudson: Next Appointment: 07/08/2023  Medication Access/Adherence  Current Pharmacy:  [id number] [place] [facility], [place] - [address] [place] [address] Phone: [phone] Fax: [phone]  [facility] [address], [place] - [address] [place] [address] Phone: [phone] Fax: [phone]   Patient reports affordability concerns with their medications: Yes  - screened fro [employer] / Medicare Extra Help at November 2024 appointment - pt did not qualify. Patient reports access/transportation concerns to their pharmacy: No  Patient reports adherence concerns with their medications:  No     Patient received Ozempic from [employer] in 2024 but only the 1mg  strength. She was now taking Ozempic 2mg  weekly (for now she is injecting 1mg  twice each week) but unable to continue to double the dose because she would run out of Ozempic before [employer] will send next shipment. She has reached her max number of refills/ shipments that Novo allows during a year - (3 shipments x 4 months each).  Received her 2025 application from [employer] for [employer] 2mg  dose.   She also received Marcelline Deist from [facility] and Me Program in 2023 - She states her  nephrologist told her that there would be an alternative available that would be covered by her Medicare for 2024 or 2025. Per Mrs. Blee she received an extra delivery of Farxiga in 2023 and states she has enough [place] on hand to last thru 2024.   Diabetes: Current medications:  Lantus 12 units daily, Farxiga 10mg  daily, glipizide 10mg  twice a day, Ozempic 1mg  weekly  - she was suppose to increase to Ozempic 2mg  weekly but was unable to get the higher dose thru [employer] patient assistance. She has continued to use 1mg  weekly and will be able to get new 2mg  dose in 2025. (Patient had reached maximum number of fills for Ozempic for the 2024 medication assistance program)   Medications tried in the past: Tradjenta - stopped when changed to Ozempic  She is checking blood glucose 2 times a day.  Using Freestyle Lite.    Diet:  Bran flakes some mornings to help with constipation.  Trying to eat more vegetables and fruits but she loves potatoes.   Objective:  Lab Results  Component Value Date   HGBA1C 8.6 (H) 03/07/2023    Lab Results  Component Value Date   CREATININE 1.37 (H) 03/07/2023   BUN 35 (H) 03/07/2023   NA 138 03/07/2023   K 4.5 03/07/2023   CL 104 03/07/2023   CO2 25 03/07/2023    Lab Results  Component Value Date   CHOL 131 12/05/2022   HDL 53.80 12/05/2022   LDLCALC 47 12/05/2022   LDLDIRECT 50.0 05/23/2020   TRIG 151.0 (H) 12/05/2022   CHOLHDL 2 12/05/2022    Medications Reviewed Today  Reviewed by Henrene Pastor, RPH-CPP (Pharmacist) on 06/06/23 at 0806  Med List Status: <None>   Medication Order Taking? Sig Documenting Provider Last Dose Status Informant  albuterol (VENTOLIN HFA) 108 (90 Base) MCG/ACT inhaler [id number] No Inhale 1-2 puffs into the lungs every 6 (six) hours as needed for wheezing or shortness of breath. [provider] Taking Active   Alcohol Swabs (ALCOHOL PREP) PADS [id number] No Pt uses an alcohol pad each time sugars are  tested. Pt tests twice daily. Dx E11.9 Sheliah Hatch, MD Taking Active Spouse/Significant Other  amLODipine (NORVASC) 10 MG tablet [id number] No Take 1 tablet (10 mg total) by mouth daily. Sheliah Hatch, MD Taking Active   atorvastatin (LIPITOR) 40 MG tablet [id number] No Take 1 tablet (40 mg total) by mouth daily. Sheliah Hatch, MD Taking Active   B Complex Vitamins (B COMPLEX PO) [id number] No Take 1 tablet by mouth daily. [provider] Taking Active   BD PEN NEEDLE NANO 2ND GEN 32G X 4 MM MISC [id number] No 1 APPLICATION BY DOES NOT APPLY ROUTE DAILY. Sheliah Hatch, MD Taking Active   Blood Glucose Monitoring Suppl (FREESTYLE LITE) w/Device KIT [id number] No Use to check blood glucose up to twice a day (Dx - type 2 DM with long term insulin use - E11.65, Z79.4) Sheliah Hatch, MD Taking Active   buPROPion (WELLBUTRIN XL) 300 MG 24 hr tablet [id number] No TAKE 1 TABLET BY MOUTH EVERY DAY Sheliah Hatch, MD Taking Active   Calcium Carb-Cholecalciferol [phone] MG-UNIT CAPS [id number] No Take 1 tablet by mouth 2 (two) times daily. [provider] Taking Active   carvedilol (COREG) 25 MG tablet [id number] No TAKE 1 TABLET BY MOUTH TWICE A DAY Sw[place], [name], MD Taking Active   dapagliflozin propanediol (FARXIGA) 10 MG TABS tablet [id number] No Take 10 mg by mouth daily. [provider] Taking Active Self           Med Note Windle Guard, JACOB   Wed Jan 25, 2021  9:18 AM) Gets through patient assistance.   ferrous sulfate 324 MG TBEC [id number] No Take 324 mg by mouth. [provider] Taking Active   fish oil-omega-3 fatty acids 1000 MG capsule [id number] No Take 1 g by mouth daily. [provider] Taking Active Spouse/Significant Other  fluticasone (FLONASE) 50 MCG/ACT nasal spray [id number] No SPRAY 2 SPRAYS INTO EACH NOSTRIL EVERY DAY  Patient taking differently: Place 2 sprays into both nostrils daily.   Sheliah Hatch, MD Taking  Active   furosemide (LASIX) 20 MG tablet [id number] No TAKE 1 TABLET BY MOUTH EVERY DAY  Patient taking differently: Take 20 mg by mouth daily. Take 1 Tablet Daily. Can Take Additional Tablet for Swelling .   Sheliah Hatch, MD Taking Active   glipiZIDE (GLUCOTROL XL) 10 MG 24 hr tablet [id number] No Take 1 tablet (10 mg total) by mouth 2 (two) times daily with a meal. Sheliah Hatch, MD Taking Active   glucose blood (FREESTYLE LITE) test strip [id number] No Use to check blood glucose up to twice a day (Dx - type 2 DM with long term insulin use - E11.65, Z79.4) Sheliah Hatch, MD Taking Active   insulin glargine (LANTUS SOLOSTAR) 100 UNIT/ML Solostar Pen [id number] No Inject 10 Units into the skin daily.  Patient taking differently: Inject 12 Units into the skin daily.   Sheliah Hatch, MD Taking Active   Lancets (FREESTYLE) lancets [id number] No Use to check blood  glucose up to twice a day (Dx - type 2 DM with long term insulin use - E11.65, Z79.4) Sheliah Hatch, MD Taking Active   loratadine (CLARITIN) 10 MG tablet [id number] No TAKE 1 TABLET BY MOUTH EVERY DAY  Patient taking differently: Take 10 mg by mouth daily.   Sheliah Hatch, MD Taking Active   Magnesium Oxide 420 MG TABS [id number] No Take by mouth. [provider] Taking Active   Melatonin 3 MG TABS [id number] No Take 10 mg by mouth at bedtime.  [provider] Taking Active Spouse/Significant Other  methocarbamol (ROBAXIN) 500 MG tablet [id number] No Take by mouth. [provider] Taking Active   Multiple Vitamins-Minerals (VISION FORMULA) TABS [id number] No Take 1 tablet by mouth daily. [provider] Taking Active Spouse/Significant Other  OVER THE COUNTER MEDICATION [id number] No Take 1 tablet by mouth daily. Equate Vision Formula 50+ [provider] Taking Active Spouse/Significant Other  Semaglutide, 2 MG/DOSE, 8 MG/3ML SOPN [id number] No Inject 2 mg as directed once  a week.  Patient taking differently: Inject 1 mg as directed once a week.   Sheliah Hatch, MD Taking Active            Med Note Clydie Braun, Cindie Laroche Apr 11, 2023 11:19 AM) Approved thru [employer] medication assistance program thru 06/25/2023  sertraline (ZOLOFT) 50 MG tablet [id number] No Take 1 tablet (50 mg total) by mouth daily. Sheliah Hatch, MD Taking Active   traMADol Janean Sark) 50 MG tablet [id number] No Take 1 tablet by mouth every 6 (six) hours as needed. [provider] Taking Active               Assessment/Plan:   Diabetes: Currently not at A1c goal.   -Continue Lantus, Ozempic and Farxiga for diabetes.  - Reviewed [address] for [employer]. Completed provider portion and forwarded to PCP to review and sign.   - Mrs Kierce states her Lantus has been $35 per fill. She is OK with this cost and declined to apply for medication assistance program for Lantus. . - Continue to check glucose twice a day.  Discussed Continuous Glucose Monitor in past. Patient is considering.  - I reviewed patient's 2025 formulary and generic Marcelline Deist / dapagliflozine is not on her [employer] formuarly. Marcelline Deist is but cost will be 19% of drug cost or about $130 per month after she meets $590 deductible. Encouraged [place] to [place] Farxiga medication [place].  Patient was provided with a [place]. She is planning to discuss with Dr Signe Colt at appointment 07/15/2023  Follow up planned January 2025.   Henrene Pastor, PharmD Clinical Pharmacist [facility]

## 2023-06-11 ENCOUNTER — Encounter: Payer: Self-pay | Admitting: Family Medicine

## 2023-06-11 ENCOUNTER — Ambulatory Visit (INDEPENDENT_AMBULATORY_CARE_PROVIDER_SITE_OTHER): Payer: Medicare Other | Admitting: Family Medicine

## 2023-06-11 VITALS — BP 102/62 | HR 68 | Temp 98.9°F | Ht <= 58 in | Wt 199.0 lb

## 2023-06-11 DIAGNOSIS — Z794 Long term (current) use of insulin: Secondary | ICD-10-CM

## 2023-06-11 DIAGNOSIS — E785 Hyperlipidemia, unspecified: Secondary | ICD-10-CM

## 2023-06-11 DIAGNOSIS — I10 Essential (primary) hypertension: Secondary | ICD-10-CM | POA: Diagnosis not present

## 2023-06-11 DIAGNOSIS — E1165 Type 2 diabetes mellitus with hyperglycemia: Secondary | ICD-10-CM | POA: Diagnosis not present

## 2023-06-11 LAB — HEPATIC FUNCTION PANEL
ALT: 42 U/L — ABNORMAL HIGH (ref 0–35)
AST: 25 U/L (ref 0–37)
Albumin: 4.2 g/dL (ref 3.5–5.2)
Alkaline Phosphatase: 83 U/L (ref 39–117)
Bilirubin, Direct: 0.1 mg/dL (ref 0.0–0.3)
Total Bilirubin: 0.4 mg/dL (ref 0.2–1.2)
Total Protein: 6.6 g/dL (ref 6.0–8.3)

## 2023-06-11 LAB — CBC WITH DIFFERENTIAL/PLATELET
Basophils Absolute: 0.1 10*3/uL (ref 0.0–0.1)
Basophils Relative: 0.7 % (ref 0.0–3.0)
Eosinophils Absolute: 0.1 10*3/uL (ref 0.0–0.7)
Eosinophils Relative: 1.8 % (ref 0.0–5.0)
HCT: 43.6 % (ref 36.0–46.0)
Hemoglobin: 14.3 g/dL (ref 12.0–15.0)
Lymphocytes Relative: 19.2 % (ref 12.0–46.0)
Lymphs Abs: 1.6 10*3/uL (ref 0.7–4.0)
MCHC: 32.7 g/dL (ref 30.0–36.0)
MCV: 96.6 fL (ref 78.0–100.0)
Monocytes Absolute: 1 10*3/uL (ref 0.1–1.0)
Monocytes Relative: 12.1 % — ABNORMAL HIGH (ref 3.0–12.0)
Neutro Abs: 5.5 10*3/uL (ref 1.4–7.7)
Neutrophils Relative %: 66.2 % (ref 43.0–77.0)
Platelets: 227 10*3/uL (ref 150.0–400.0)
RBC: 4.51 Mil/uL (ref 3.87–5.11)
RDW: 13.9 % (ref 11.5–15.5)
WBC: 8.2 10*3/uL (ref 4.0–10.5)

## 2023-06-11 LAB — LIPID PANEL
Cholesterol: 132 mg/dL (ref 0–200)
HDL: 54.9 mg/dL (ref >39.00)
LDL Cholesterol: 49 mg/dL (ref 0–99)
NonHDL: 77.59
Total CHOL/HDL Ratio: 2
Triglycerides: 142 mg/dL (ref 0.0–149.0)
VLDL: 28.4 mg/dL (ref 0.0–40.0)

## 2023-06-11 LAB — BASIC METABOLIC PANEL
BUN: 35 mg/dL — ABNORMAL HIGH (ref 6–23)
CO2: 28 meq/L (ref 19–32)
Calcium: 10.2 mg/dL (ref 8.4–10.5)
Chloride: 103 meq/L (ref 96–112)
Creatinine, Ser: 1.46 mg/dL — ABNORMAL HIGH (ref 0.40–1.20)
GFR: 36.07 mL/min — ABNORMAL LOW (ref >60.00)
Glucose, Bld: 222 mg/dL — ABNORMAL HIGH (ref 70–99)
Potassium: 4.6 meq/L (ref 3.5–5.1)
Sodium: 138 meq/L (ref 135–145)

## 2023-06-11 LAB — HEMOGLOBIN A1C: Hgb A1c MFr Bld: 8.5 % — ABNORMAL HIGH (ref 4.6–6.5)

## 2023-06-11 LAB — TSH: TSH: 1.33 u[IU]/mL (ref 0.35–5.50)

## 2023-06-11 MED ORDER — ATORVASTATIN CALCIUM 40 MG PO TABS: 40.0000 mg | ORAL_TABLET | Freq: Every day | ORAL | 1 refills | Status: DC

## 2023-06-11 MED ORDER — AMLODIPINE BESYLATE 10 MG PO TABS: 10.0000 mg | ORAL_TABLET | Freq: Every day | ORAL | 1 refills | Status: DC

## 2023-06-11 NOTE — Assessment & Plan Note (Signed)
Chronic problem.  Pt is not following a low carb diet- had coconut cake last night- and has gained 5 lbs.  UTD on eye exam, foot exam, microalbumin.  If rationing her Semaglutide until her new pt assistance supply arrives.  Check labs.  Adjust meds prn

## 2023-06-11 NOTE — Assessment & Plan Note (Signed)
Chronic problem, on Lipitor 40mg daily w/o difficulty.  Check labs.  Adjust meds prn  

## 2023-06-11 NOTE — Patient Instructions (Signed)
Follow up in 3-4 months to recheck sugars We'll notify you of your lab results and make any changes if needed Continue to work on healthy diet and regular exercise- you can do it! Call with any questions or concerns Stay Safe!  Stay Healthy! Happy Holidays!!!

## 2023-06-11 NOTE — Progress Notes (Signed)
   Subjective:    Patient ID: Kayla Brown, female    DOB: 11-17-1950, 72 y.o.   MRN: [id number]  HPI DM- chronic problem, on Farxiga 10mg  daily, Glipizide 10mg  daily, Lantus 12 units daily, and Semaglutide 1mg  weekly until new prescription arrives for 2mg .  Pt reports CBGs have been high- 190s.  UTD on eye exam, foot exam, microalbumin.    HTN- chronic problem, on Amlodipine 10mg  daily, Coreg 25mg  BID w/ excellent control.  No CP, SOB, HA's, visual changes.  Hyperlipidemia- chronic problem, on Lipitor 40mg  daily.  No abd pain, N/V.   Review of Systems For ROS see HPI     Objective:   Physical Exam Vitals reviewed.  Constitutional:      General: She is not in acute distress.    Appearance: Normal appearance. She is well-developed. She is obese. She is not ill-appearing.  HENT:     Head: Normocephalic and atraumatic.  Eyes:     Conjunctiva/sclera: Conjunctivae normal.     Pupils: Pupils are equal, round, and reactive to light.  Neck:     Thyroid: No thyromegaly.  Cardiovascular:     Rate and Rhythm: Normal rate and regular rhythm.     Pulses: Normal pulses.     Heart sounds: Normal heart sounds. No murmur heard. Pulmonary:     Effort: Pulmonary effort is normal. No respiratory distress.     Breath sounds: Normal breath sounds.  Abdominal:     General: There is no distension.     Palpations: Abdomen is soft.     Tenderness: There is no abdominal tenderness.  Musculoskeletal:     Cervical back: Normal range of motion and neck supple.     Right lower leg: Edema (trace) present.     Left lower leg: Edema (trace) present.  Lymphadenopathy:     Cervical: No cervical adenopathy.  Skin:    General: Skin is warm and dry.  Neurological:     Mental Status: She is alert and oriented to person, place, and time.  Psychiatric:        Mood and Affect: Mood normal.        Behavior: Behavior normal.        Thought Content: Thought content normal.           Assessment &  Plan:

## 2023-06-11 NOTE — Assessment & Plan Note (Signed)
Chronic problem.  Excellent control today on Amlodipine and Coreg.  No med changes at this time

## 2023-06-12 NOTE — Progress Notes (Signed)
Patient contacted via mychart

## 2023-06-13 ENCOUNTER — Telehealth: Payer: Self-pay | Admitting: Pharmacist

## 2023-06-13 ENCOUNTER — Telehealth: Payer: Self-pay

## 2023-06-13 NOTE — Telephone Encounter (Signed)
Pt came by and picked up Ozempic

## 2023-06-13 NOTE — Telephone Encounter (Signed)
Called patient to inform her we have received her Ozempic shipment and it is ready for her to pick up, patient was appreciative and stated she would be here to pick it up today.  Placed in POD B refrigerator door. Blue slip indicating patient information is in the bag with 2 boxes of Ozempic

## 2023-06-13 NOTE — Telephone Encounter (Signed)
Followed up on 2025 Novo Nordisk medication assistance program application. Verified that patient has been approved to received Ozempic 2mg  from assistance program starrting 06/26/2023 thru 06/24/2024.   Patient notified.

## 2023-06-24 ENCOUNTER — Other Ambulatory Visit: Payer: Self-pay | Admitting: Cardiology

## 2023-06-25 ENCOUNTER — Other Ambulatory Visit: Payer: Self-pay | Admitting: Family Medicine

## 2023-06-28 ENCOUNTER — Other Ambulatory Visit: Payer: Self-pay | Admitting: Pharmacist

## 2023-06-28 NOTE — Progress Notes (Addendum)
 06/28/2023 Name: Kayla Brown MRN: [id number] DOB: 03/15/1951  Chief Complaint  Patient presents with   Medication Management    Kayla Brown is a 73 y.o. year old female. Coordinated with [employer] medication assistance program.    They were referred to the pharmacist by their PCP for assistance in managing diabetes and medication access.    Subjective:  Care Team: Primary Care Provider: Mahlon Comer BRAVO, MD ; Next Scheduled Visit: 09/12/2023 Cardiologist: Kayla Jordan, MD / Jerilynn, NP; Next Scheduled Visit:  - recall set for 08/2023 Podiatrist: Dr Cordella Bold; Next Appointment: 08/22/2023  Nephrologist: Dr Gearline;Last Appointment: 07/15/2023 Pulmonologist: Dr Neysa: Next Appointment: 07/08/2023  Medication Access/Adherence  Current Pharmacy:  [id number] [place], [place] - [address] [place] [address] Phone: [phone] Fax: [phone]  [facility] [address] [place] - [address] [place] [address] Phone: [phone] Fax: [phone]   Patient reports affordability concerns with their medications: Yes  - screened fro LIS / Medicare Extra Help at November 2024 appointment - pt did not qualify. Patient reports access/transportation concerns to their pharmacy: No  Patient reports adherence concerns with their medications:  No     Patient received Ozempic  from [employer] in 2024 but only the 1mg  strength. She was now taking Ozempic  2mg  weekly (for now she is injecting 1mg  twice each week) but unable to continue to double the dose because she would run out of Ozempic  before Novo Nordisk will send next shipment. She has reached her max number of refills/ shipments that Novo allows during a year in 2024 - (3 shipments x 4 months each).  Received her 2025 application from [employer] for Ozempic  2mg  dose and verified with [employer] she has been approved thru  06/24/2024.   She also received Farxiga  from [place] in 2023 - She states her nephrologist told her that there would be an alternative available that would be covered by her Medicare for 2024 or 2025. Per [name] she received an extra delivery of Farxiga  in 2023 and states she has enough Farxiga  on hand to last thru 2024.   Diabetes: Current medications:  Lantus  12 units daily, Farxiga  10mg  daily, glipizide  10mg  twice a day, Ozempic  1mg  weekly  - she was suppose to increase to Ozempic  2mg  weekly but was unable to get the higher dose thru [employer] patient assistance. She has continued to use 1mg  weekly and will be able to get new 2mg  dose in 2025. (Patient had reached maximum number of fills for Ozempic  for the 2024 medication assistance program)   Medications tried in the past: Tradjenta  - stopped when changed to Ozempic   She is checking blood glucose 2 times a day.  Using Freestyle Lite.    Diet:  Bran flakes some mornings to help with constipation.  Trying to eat more vegetables and fruits but she loves potatoes.   Objective:  Lab Results  Component Value Date   HGBA1C 8.5 (H) 06/11/2023    Lab Results  Component Value Date   CREATININE 1.46 (H) 06/11/2023   BUN 35 (H) 06/11/2023   NA 138 06/11/2023   K 4.6 06/11/2023   CL 103 06/11/2023   CO2 28 06/11/2023    Lab Results  Component Value Date   CHOL 132 06/11/2023   HDL 54.90 06/11/2023   LDLCALC 49 06/11/2023   LDLDIRECT 50.0 05/23/2020   TRIG 142.0 06/11/2023   CHOLHDL 2 06/11/2023  Medications Reviewed Today     Reviewed by Carla Milling, RPH-CPP (Pharmacist) on 06/28/23 at 1432  Med List Status: <None>   Medication Order Taking? Sig Documenting Provider Last Dose Status Informant  albuterol  (VENTOLIN  HFA) 108 (90 Base) MCG/ACT inhaler [id number] Yes Inhale 1-2 puffs into the lungs every 6 (six) hours as needed for wheezing or shortness of breath. [provider] Taking Active    Alcohol  Swabs (ALCOHOL  PREP) PADS [id number] Yes Pt uses an alcohol  pad each time sugars are tested. Pt tests twice daily. Dx E11.9 [name], Katherine E, MD Taking Active Spouse/Significant Other  amLODipine  (NORVASC ) 10 MG tablet [id number] Yes Take 1 tablet (10 mg total) by mouth daily. [name], Katherine E, MD Taking Active   atorvastatin  (LIPITOR) 40 MG tablet [id number] Yes Take 1 tablet (40 mg total) by mouth daily. [name], Katherine E, MD Taking Active   B Complex Vitamins (B COMPLEX PO) [id number] Yes Take 1 tablet by mouth daily. [provider] Taking Active   BD PEN NEEDLE NANO 2ND GEN 32G X 4 MM MISC [id number] Yes 1 APPLICATION BY DOES NOT APPLY ROUTE DAILY. Kayla Comer BRAVO, MD Taking Active   Blood Glucose Monitoring Suppl (FREESTYLE LITE) w/Device KIT [id number] Yes Use to check blood glucose up to twice a day (Dx - type 2 DM with long term insulin  use - E11.65, Z79.4) Kayla Comer BRAVO, MD Taking Active   buPROPion  (WELLBUTRIN  XL) 300 MG 24 hr tablet [id number] Yes TAKE 1 TABLET BY MOUTH EVERY DAY [name], Katherine E, MD Taking Active   Calcium  Carb-Cholecalciferol  [phone] MG-UNIT CAPS [id number] Yes Take 1 tablet by mouth 2 (two) times daily. [provider] Taking Active   carvedilol  (COREG ) 25 MG tablet [id number] Yes TAKE 1 TABLET BY MOUTH TWICE A DAY Brown, Kayla M, MD Taking Active   dapagliflozin  propanediol (FARXIGA ) 10 MG TABS tablet [id number] Yes Take 10 mg by mouth daily. [provider] Taking Active Self           Med Note JUSTINO, MILLING KATHEE Schaumann Jun 06, 2023  8:17 AM)    ferrous sulfate 324 MG TBEC [id number] Yes Take 324 mg by mouth. [provider] Taking Active   fish oil-omega-3 fatty acids 1000 MG capsule [id number] Yes Take 1 g by mouth daily. [provider] Taking Active Spouse/Significant Other  fluticasone  (FLONASE ) 50 MCG/ACT nasal spray [id number] Yes SPRAY 2 SPRAYS INTO EACH NOSTRIL EVERY DAY  Patient taking differently:  Place 2 sprays into both nostrils daily.   [name], Katherine E, MD Taking Active   furosemide  (LASIX ) 20 MG tablet [id number] Yes TAKE 1 TABLET BY MOUTH EVERY DAY  Patient taking differently: Take 20 mg by mouth daily. Take 1 Tablet Daily. Can Take Additional Tablet for Swelling .   [name], Katherine E, MD Taking Active   glipiZIDE  (GLUCOTROL  XL) 10 MG 24 hr tablet [id number] Yes Take 1 tablet (10 mg total) by mouth 2 (two) times daily with a meal. Kayla Comer BRAVO, MD Taking Active   glucose blood (FREESTYLE LITE) test strip [id number] Yes Use to check blood glucose up to twice a day (Dx - type 2 DM with long term insulin  use - E11.65, Z79.4) Kayla Comer BRAVO, MD Taking Active   insulin  glargine (LANTUS  SOLOSTAR) 100 UNIT/ML Solostar Pen [id number] Yes Inject 10 Units into the skin daily.  Patient taking differently: Inject 12 Units into the skin daily.   [name], Katherine E, MD Taking Active   Lancets (FREESTYLE) lancets [id number]  Yes Use to check blood glucose up to twice a day (Dx - type 2 DM with long term insulin  use - E11.65, Z79.4) Kayla Comer BRAVO, MD Taking Active   loratadine  (CLARITIN ) 10 MG tablet [id number] Yes TAKE 1 TABLET BY MOUTH EVERY DAY  Patient taking differently: Take 10 mg by mouth daily.   [name], Katherine E, MD Taking Active   Magnesium Oxide 420 MG TABS [id number] Yes Take by mouth. [provider] Taking Active   Melatonin 3 MG TABS [id number] Yes Take 10 mg by mouth at bedtime.  [provider] Taking Active Spouse/Significant Other  methocarbamol (ROBAXIN) 500 MG tablet [id number] Yes Take by mouth. [provider] Taking Active   Multiple Vitamins-Minerals (VISION FORMULA) TABS [id number] Yes Take 1 tablet by mouth daily. [provider] Taking Active Spouse/Significant Other  OVER THE COUNTER MEDICATION [id number] Yes Take 1 tablet by mouth daily. Equate Vision Formula 50+ [provider] Taking Active Spouse/Significant  Other  Semaglutide , 2 MG/DOSE, 8 MG/3ML SOPN [id number] Yes Inject 2 mg as directed once a week.  Patient taking differently: Inject 1 mg as directed once a week.   Kayla Comer BRAVO, MD Taking Active            Med Note [facility] [place] [facility]   Fri Jun 28, 2023  2:32 PM) Approved for Novo Nordisk medication assistance program thru 06/24/2024  sertraline  (ZOLOFT ) 50 MG tablet [id number] Yes Take 1 tablet (50 mg total) by mouth daily. Kayla Comer BRAVO, MD Taking Active   traMADol  (ULTRAM ) 50 MG tablet [id number] Yes Take 1 tablet by mouth every 6 (six) hours as needed. [provider] Taking Active               Assessment/Plan:   Diabetes: Currently not at A1c goal.   -Continue Lantus , Ozempic  and Farxiga  for diabetes.  - Verified she has been approved to receive Ozempci 2mg  weeky for 2025 - first shipment / fill is being processed.  - Mrs Hilley states her Lantus  has been $35 per fill. She is OK with this cost and declined to apply for medication assistance program for Lantus . . - Continue to check glucose twice a day.  Discussed Continuous Glucose Monitor in past. Patient is considering.  - I reviewed patient's 2025 formulary and generic Farxiga  / dapagliflozine is not on her [employer] formuarly. Farxiga  is but cost will be 19% of drug cost or about $130 per month after she meets $590 deductible. Encouraged patient to apply for [place] / Farxiga  medication assistance program. She was provided with appliciaton in December 2024. She is planning to discuss with Dr Gearline at appointment 07/15/2023  Follow up planned 2 to 3 weeks.   Madelin Ray, PharmD Clinical Pharmacist [facility]  07/02/2023 Addendum Received call from patient confirming that she has 4 doses of Ozempic  left. Will continue to follow for first Ozempic  delivery in 2025.   Madelin Ray, PharmD Clinical Pharmacist [facility]

## 2023-07-08 ENCOUNTER — Ambulatory Visit: Payer: Medicare Other | Admitting: Internal Medicine

## 2023-07-18 NOTE — Telephone Encounter (Signed)
PAP: Patient assistance application for Ozempic has been approved by PAP Companies: [employer] from 05/30/2023 to 06/24/2024. Medication should be delivered to PAP Delivery: Provider's office. For further shipping updates, please [employer] at [phone]. Patient ID is: NO ID    Georga Bora Rx Patient Advocate [phone]

## 2023-07-22 ENCOUNTER — Other Ambulatory Visit: Payer: Self-pay | Admitting: Family Medicine

## 2023-07-22 DIAGNOSIS — D631 Anemia in chronic kidney disease: Secondary | ICD-10-CM | POA: Diagnosis not present

## 2023-07-22 DIAGNOSIS — I129 Hypertensive chronic kidney disease with stage 1 through stage 4 chronic kidney disease, or unspecified chronic kidney disease: Secondary | ICD-10-CM | POA: Diagnosis not present

## 2023-07-22 DIAGNOSIS — Z0000 Encounter for general adult medical examination without abnormal findings: Secondary | ICD-10-CM

## 2023-07-22 DIAGNOSIS — E785 Hyperlipidemia, unspecified: Secondary | ICD-10-CM | POA: Diagnosis not present

## 2023-07-22 DIAGNOSIS — N2581 Secondary hyperparathyroidism of renal origin: Secondary | ICD-10-CM | POA: Diagnosis not present

## 2023-07-22 DIAGNOSIS — N189 Chronic kidney disease, unspecified: Secondary | ICD-10-CM | POA: Diagnosis not present

## 2023-07-22 DIAGNOSIS — N1831 Chronic kidney disease, stage 3a: Secondary | ICD-10-CM | POA: Diagnosis not present

## 2023-07-22 DIAGNOSIS — E1122 Type 2 diabetes mellitus with diabetic chronic kidney disease: Secondary | ICD-10-CM | POA: Diagnosis not present

## 2023-07-22 DIAGNOSIS — I5032 Chronic diastolic (congestive) heart failure: Secondary | ICD-10-CM | POA: Diagnosis not present

## 2023-07-23 LAB — LAB REPORT - SCANNED
Albumin, Urine POC: 37.4
Calcium: 10.2
Creatinine, POC: 93.6 mg/dL
EGFR: 42
Microalb Creat Ratio: 40
PTH: 53

## 2023-07-29 ENCOUNTER — Ambulatory Visit
Admission: RE | Admit: 2023-07-29 | Discharge: 2023-07-29 | Disposition: A | Discharge status: Home / Self Care | Payer: Medicare Other | Source: Ambulatory Visit | Attending: Family Medicine | Admitting: Family Medicine

## 2023-07-29 DIAGNOSIS — Z1231 Encounter for screening mammogram for malignant neoplasm of breast: Secondary | ICD-10-CM | POA: Diagnosis not present

## 2023-07-29 DIAGNOSIS — Z0000 Encounter for general adult medical examination without abnormal findings: Secondary | ICD-10-CM

## 2023-07-30 ENCOUNTER — Telehealth: Payer: Self-pay | Admitting: Pharmacist

## 2023-07-30 NOTE — Telephone Encounter (Signed)
 Patient has been approved for  [employer]ovo [employer] medication assistance program to receive Ozempic  2mg , however Novo is behind in shipping medication assistance program for 2025. Her Ozempic  order is being processed but there is no expected shipping date.  She reports she has 1 dose of Ozempic  left.  Plan to call to get voucher from Novo that patient can use to [facility]   Also discussed Farxiga  medication assistance program. Patient visited nephrologist and Farxiga  10mg  daily was continued.  Dr Gearline provided patient with application for [place] and [place]. Their office will handle the Farxiga  medication assistance program.

## 2023-07-31 NOTE — Telephone Encounter (Signed)
 Called [employer] to request 28 day voucher for Ozempic  2mg  for patient. Patient has been approved for Novo Nordisk medication assistance program for 2025 but Novo is behind in shipping. Below is voucher information   BIN: [id number] PCN: [id number] GRP: [id number] Voucher ID: [id number] Medication: Ozempic  2 mg/mL (1 pen X 9mL/Pen)  [facility]  2mg  x 28 days / 4 doses with voucher.   Voucher was accepted and cost to patient was $0.

## 2023-08-01 NOTE — Telephone Encounter (Signed)
 Patient was notified that she could pick up 28 days supply of Ozempic  2mg  - with voucher cost should be $0

## 2023-08-18 ENCOUNTER — Other Ambulatory Visit: Payer: Self-pay | Admitting: Family Medicine

## 2023-08-19 ENCOUNTER — Telehealth: Payer: Self-pay

## 2023-08-19 NOTE — Telephone Encounter (Signed)
 Received shipment of 4 boxes Ozempic 8mg /50mL placd in POD B fridge until patient can pick up

## 2023-08-21 NOTE — Progress Notes (Unsigned)
 HPI F never smoker followed for OSA, Dyspnea improved after treatment for heart failure. Complicated by anxiety and depression, DM2, HBP, Hyperlipidemia, Morbid obesity, OSA, Pulmonary Hypertension(PASP by echo 02/2015),   NPSG 05/14/16 AHI 93/ hr, desaturation to 84% PFT 04/04/15   - mild Diffusion defect, normal flows and volumes without response to bronchodilator PFT 04/24/2019- minimal obst w airtrapping, sl resp to BD, Nl DLCO 2D echo February 02, 2019 EF 60-65%, normal systolic function right ventricle, mild to moderately dilated left atrium, RV systolic function appeared to be normal with normal RV size 2D echo March 06, 2019 EF greater than 65%, moderate LVH, right ventricle normal systolic function, right ventricle systolic pressure could not be assessed.  CT chest March 05, 2019 extensive atelectatic changes throughout the lungs with a dependent predominance, mosaic attenuation throughout the lungs, emphysema  VQ scan 03/04/2019 no unmatched segmental perfusion defects to suggest acute PE Autoimmune work-up negative 2020 , SH /Occupational hx unrevealing  ------------------------------------------------------------------------------------------   07/05/22- 71 yoF never smoker followed for OSA, Dyspnea improved after treatment for heart failure. Complicated by anxiety and depression, DM2, HBP, Hyperlipidemia, Morbid obesity, Pulmonary Hypertension(PASP by echo 02/2015),Chronic Diastolic CHF,  -Ventolin hfa, Flonase, Melatonin,  CPAP 19/ [facility]   AirSense 10 Auto replaced 2022 Download- compliance 100%, AHI 0.2/hr Body weight today- 186 lbs Covid vax-3 Phizer Flu vax-had -----Pt is doing well, no issues with cpap machine                      husband here She has had to work With Different Masks to Deal with Leak but Is Doing Better Now Using a Fish farm manager and a Health visitor from [employer].  Sleeps Much Better with CPAP.  Had Questions about The Earnest Vorhees but Is Satisfied Not  to Look into That more at his ime. Breathing is comfortable CXR 07/04/21- MPRESSION: No radiographic evidence of acute cardiopulmonary process.  08/22/23- 19 yoF never smoker followed for OSA, Dyspnea improved after treatment for heart failure. Complicated by anxiety and depression, DM2, HBP, Hyperlipidemia, Morbid obesity, Pulmonary Hypertension(PASP by echo 02/2015),Chronic Diastolic CHF,  -Ventolin hfa, Flonase, Melatonin,  CPAP 19/ [facility]   AirSense 10 Auto replaced 2022 Download- compliance 100%, AHI 0.3/hr                        Husband here Body weight today- 199 lbs Discussed the use of AI scribe software for clinical note transcription with the patient, who gave verbal consent to proceed. History of Present Illness   The patient, with a history of sleep apnea managed with CPAP, presents for a routine follow-up. She reports occasional 'blow by' with her CPAP mask, requiring frequent adjustments. Despite this, she is sleeping comfortably and is satisfied with her CPAP machine. . The patient also has an albuterol inhaler, which she uses infrequently.     ROS-see HPI   + = positive Constitutional:    weight loss, night sweats, fevers, chills, fatigue, lassitude. HEENT:    headaches, difficulty swallowing, tooth/dental problems, sore throat,       sneezing, itching, ear ache, nasal congestion, post nasal drip, snoring CV:    chest pain, orthopnea, PND, swelling in lower extremities, anasarca,                                   dizziness, palpitations Resp:   +shortness of breath  with exertion or at rest.                productive cough,   non-productive cough, coughing up of blood.              change in color of mucus.  wheezing.   Skin:    rash or lesions. GI:  No-   heartburn, indigestion, abdominal pain, nausea, vomiting, diarrhea,                 change in bowel habits, loss of appetite GU: dysuria, change in color of urine, no urgency or frequency.   flank pain. MS:  + joint  pain, stiffness, decreased range of motion, back pain. Neuro-     nothing unusual Psych:  change in mood or affect.  depression or anxiety.   memory loss.  OBJ- Physical Exam General- Alert, Oriented, Affect-appropriate, Distress- none acute, + obese Skin- rash-none, lesions- none, excoriation- none Lymphadenopathy- none Head- atraumatic            Eyes- +strabismus            Ears- Hearing, canals-normal            Nose- Clear, no-Septal dev, mucus, polyps, erosion, perforation             Throat- Mallampati II , mucosa clear , drainage- none, tonsils- atrophic Neck- flexible , trachea midline, no stridor , thyroid nl, carotid no bruit Chest - symmetrical excursion , unlabored           Heart/CV- RRR , + trace S murmur , no gallop  , no rub, nl s1 s2                           - JVD- none , edema- none, stasis changes- none, varices- none           Lung- +clear, wheeze- none, cough- none , dullness-none, rub- none           Chest wall-  Abd-  Br/ Gen/ Rectal- Not done, not indicated Extrem- cyanosis- none, clubbing, none, atrophy- none, strength- nl Neuro- grossly intact to   Assessment and Plan:    Obstructive Sleep Apnea Patient is comfortable with CPAP and using it regularly. Noted occasional mask leak requiring adjustment. -Change CPAP setting to auto pap ranging between 10 and 20 cm H2O. -Order to be sent to [facility] for adjustment. Patient may need to bring machine in for this change.  Asthma Infrequent use of albuterol inhaler, no current need for refill. -Continue current management.  General Health Maintenance -Continue current medications and supplements as directed. -Follow-up as needed.

## 2023-08-22 ENCOUNTER — Encounter: Payer: Self-pay | Admitting: Internal Medicine

## 2023-08-22 ENCOUNTER — Ambulatory Visit: Payer: Medicare Other | Admitting: Podiatry

## 2023-08-22 ENCOUNTER — Ambulatory Visit (INDEPENDENT_AMBULATORY_CARE_PROVIDER_SITE_OTHER): Payer: Medicare Other | Admitting: Internal Medicine

## 2023-08-22 VITALS — BP 116/60 | HR 74 | Temp 97.9°F | Ht <= 58 in | Wt 199.0 lb

## 2023-08-22 DIAGNOSIS — G4733 Obstructive sleep apnea (adult) (pediatric): Secondary | ICD-10-CM | POA: Diagnosis not present

## 2023-08-22 DIAGNOSIS — J45909 Unspecified asthma, uncomplicated: Secondary | ICD-10-CM | POA: Diagnosis not present

## 2023-08-22 NOTE — Patient Instructions (Signed)
 Order- DME [facility]- please change CPAP to auto 10-20, continue mask of choice, supplies, AirView

## 2023-08-26 ENCOUNTER — Other Ambulatory Visit: Payer: Self-pay | Admitting: Family Medicine

## 2023-08-26 DIAGNOSIS — E119 Type 2 diabetes mellitus without complications: Secondary | ICD-10-CM | POA: Diagnosis not present

## 2023-08-26 DIAGNOSIS — E1165 Type 2 diabetes mellitus with hyperglycemia: Secondary | ICD-10-CM

## 2023-08-26 DIAGNOSIS — H524 Presbyopia: Secondary | ICD-10-CM | POA: Diagnosis not present

## 2023-08-26 LAB — HM DIABETES EYE EXAM

## 2023-08-28 ENCOUNTER — Encounter: Payer: Self-pay | Admitting: Family Medicine

## 2023-09-02 ENCOUNTER — Encounter: Payer: Self-pay | Admitting: Podiatry

## 2023-09-02 ENCOUNTER — Ambulatory Visit (INDEPENDENT_AMBULATORY_CARE_PROVIDER_SITE_OTHER): Payer: Medicare Other | Admitting: Podiatry

## 2023-09-02 DIAGNOSIS — B351 Tinea unguium: Secondary | ICD-10-CM | POA: Diagnosis not present

## 2023-09-02 DIAGNOSIS — M79675 Pain in left toe(s): Secondary | ICD-10-CM

## 2023-09-02 DIAGNOSIS — M79674 Pain in right toe(s): Secondary | ICD-10-CM

## 2023-09-02 DIAGNOSIS — N1832 Chronic kidney disease, stage 3b: Secondary | ICD-10-CM | POA: Diagnosis not present

## 2023-09-02 DIAGNOSIS — E1122 Type 2 diabetes mellitus with diabetic chronic kidney disease: Secondary | ICD-10-CM | POA: Diagnosis not present

## 2023-09-02 NOTE — Progress Notes (Signed)
 This patient returns to my office for at risk foot care.  This patient requires this care by a professional since this patient will be at risk due to having diabetes mellitus.  This patient is unable to cut nails herself since the patient cannot reach her nails.These nails are painful walking and wearing shoes.  This patient presents for at risk foot care today.  General Appearance  Alert, conversant and in no acute stress.  Vascular  Dorsalis pedis and posterior tibial  pulses are palpable  bilaterally.  Capillary return is within normal limits  bilaterally. Temperature is within normal limits  bilaterally.  Neurologic  Senn-Weinstein monofilament wire test within normal limits  bilaterally. Muscle power within normal limits bilaterally.  Nails Thick disfigured discolored nails with subungual debris  from hallux to fifth toes bilaterally. No evidence of bacterial infection or drainage bilaterally.  Orthopedic  No limitations of motion  feet .  No crepitus or effusions noted.  No bony pathology or digital deformities noted.  HAV  B/L.  Hammer toes  B/L.  Skin  normotropic skin with no porokeratosis noted bilaterally.  No signs of infections or ulcers noted.     Onychomycosis  Pain in right toes  Pain in left toes  Consent was obtained for treatment procedures.   Mechanical debridement of nails 1-5  bilaterally performed with a nail nipper.  Filed with dremel without incident.    Return office visit   3   mos.                  Told patient to return for periodic foot care and evaluation due to potential at risk complications.   Helane Gunther DPM

## 2023-09-12 ENCOUNTER — Encounter: Payer: Self-pay | Admitting: Family Medicine

## 2023-09-12 ENCOUNTER — Ambulatory Visit (INDEPENDENT_AMBULATORY_CARE_PROVIDER_SITE_OTHER): Payer: Medicare Other | Admitting: Family Medicine

## 2023-09-12 VITALS — BP 108/60 | HR 78 | Temp 98.0°F | Ht <= 58 in | Wt 197.4 lb

## 2023-09-12 DIAGNOSIS — E1165 Type 2 diabetes mellitus with hyperglycemia: Secondary | ICD-10-CM

## 2023-09-12 DIAGNOSIS — Z794 Long term (current) use of insulin: Secondary | ICD-10-CM | POA: Diagnosis not present

## 2023-09-12 DIAGNOSIS — M5416 Radiculopathy, lumbar region: Secondary | ICD-10-CM | POA: Diagnosis not present

## 2023-09-12 LAB — MICROALBUMIN / CREATININE URINE RATIO
Creatinine,U: 73.3 mg/dL
Microalb Creat Ratio: UNDETERMINED mg/g (ref 0.0–30.0)
Microalb, Ur: <0.7 mg/dL

## 2023-09-12 LAB — BASIC METABOLIC PANEL
BUN: 38 mg/dL — ABNORMAL HIGH (ref 6–23)
CO2: 29 meq/L (ref 19–32)
Calcium: 10 mg/dL (ref 8.4–10.5)
Chloride: 100 meq/L (ref 96–112)
Creatinine, Ser: 1.54 mg/dL — ABNORMAL HIGH (ref 0.40–1.20)
GFR: 33.77 mL/min — ABNORMAL LOW (ref >60.00)
Glucose, Bld: 262 mg/dL — ABNORMAL HIGH (ref 70–99)
Potassium: 4.4 meq/L (ref 3.5–5.1)
Sodium: 137 meq/L (ref 135–145)

## 2023-09-12 LAB — HEMOGLOBIN A1C: Hgb A1c MFr Bld: 8.3 % — ABNORMAL HIGH (ref 4.6–6.5)

## 2023-09-12 NOTE — Assessment & Plan Note (Signed)
 Chronic problem.  On Semaglutide, Lantus, Glipizide, Farxiga.  Last A1C was above goal at 8.5%  UTD on foot exam, eye exam.  Will get microalbumin today.  Check labs.  Adjust meds prn

## 2023-09-12 NOTE — Progress Notes (Signed)
   Subjective:    Patient ID: Kayla Brown, female    DOB: 06-Mar-1951, 73 y.o.   MRN: [id number]  HPI DM- chronic problem, on Semaglutide 1mg  weekly, Lantus 10 units nightly, Glipizide 10mg  BID, Farxiga 10mg  daily.  UTD on foot exam, eye exam, due for microalbumin.  Last A1C 8.5%.  no CP, SOB, HA's, visual changes, abd pain, N/V.  No numbness/tingling of hands/feet.  Lumbar radiculopathy- pt gets epidural injxns prn.  Surgeon wants to put a rod in her back and pt says 'that's not going to happen'.  Takes Tramadol very sparingly.  Wants me to assume prescribing responsibility since she sees me regularly.   Review of Systems For ROS see HPI     Objective:   Physical Exam Vitals reviewed.  Constitutional:      General: She is not in acute distress.    Appearance: Normal appearance. She is well-developed. She is obese. She is not ill-appearing.  HENT:     Head: Normocephalic and atraumatic.  Eyes:     Conjunctiva/sclera: Conjunctivae normal.     Pupils: Pupils are equal, round, and reactive to light.  Neck:     Thyroid: No thyromegaly.  Cardiovascular:     Rate and Rhythm: Normal rate and regular rhythm.     Pulses: Normal pulses.     Heart sounds: Normal heart sounds. No murmur heard. Pulmonary:     Effort: Pulmonary effort is normal. No respiratory distress.     Breath sounds: Normal breath sounds.  Abdominal:     General: There is no distension.     Palpations: Abdomen is soft.     Tenderness: There is no abdominal tenderness.  Musculoskeletal:     Cervical back: Normal range of motion and neck supple.     Right lower leg: Edema (1+) present.     Left lower leg: Edema (1+) present.  Lymphadenopathy:     Cervical: No cervical adenopathy.  Skin:    General: Skin is warm and dry.  Neurological:     Mental Status: She is alert and oriented to person, place, and time.  Psychiatric:        Behavior: Behavior normal.           Assessment & Plan:

## 2023-09-12 NOTE — Assessment & Plan Note (Signed)
 Ongoing issue for pt.  She is seeing specialist for back injxns but is not interested in pursuing surgery.  She is asking me to assume prescribing responsibility for Tramadol prn.  Agreed.

## 2023-09-12 NOTE — Patient Instructions (Signed)
 Follow up in 3-4 months to recheck sugar, BP, and cholesterol We'll notify you of your lab results and make any changes if needed Continue to work on healthy diet and regular physical activity- you can do it! Call with any questions or concerns Stay Safe!  Stay Healthy! HAPPY SPRING!!!

## 2023-09-13 ENCOUNTER — Encounter: Payer: Self-pay | Admitting: Family Medicine

## 2023-09-17 ENCOUNTER — Ambulatory Visit: Payer: Medicare Other | Admitting: Adult Health

## 2023-09-18 ENCOUNTER — Encounter: Payer: Self-pay | Admitting: Family Medicine

## 2023-09-19 ENCOUNTER — Other Ambulatory Visit: Payer: Self-pay | Admitting: Family Medicine

## 2023-09-19 MED ORDER — BASAGLAR KWIKPEN 100 UNIT/ML ~~LOC~~ SOPN: 10.0000 [IU] | PEN_INJECTOR | Freq: Every day | SUBCUTANEOUS | 1 refills | Status: DC

## 2023-09-19 NOTE — Telephone Encounter (Signed)
 Patient can no longer get Lantus and Basaglar is preferred is it okay for her to switch? Patient is asking your thoughts on this recommendation

## 2023-09-19 NOTE — Progress Notes (Signed)
 Prescription updated to reflect formulary [facility]

## 2023-09-21 ENCOUNTER — Other Ambulatory Visit: Payer: Self-pay | Admitting: Family Medicine

## 2023-10-01 ENCOUNTER — Ambulatory Visit: Payer: Medicare Other | Admitting: Adult Health

## 2023-10-29 ENCOUNTER — Ambulatory Visit: Admitting: Nurse Practitioner

## 2023-11-09 IMAGING — US US RENAL
1 series · 14 of 25 positions shown · non-contrast
Comparison: Ultrasound renal 07/05/2020.

CLINICAL DATA: Chronic kidney disease.

EXAM:
RENAL / URINARY TRACT ULTRASOUND COMPLETE

[Series 1: us renal · 0.26mm/px · 14 of 36 slices shown]
[im 1/36]
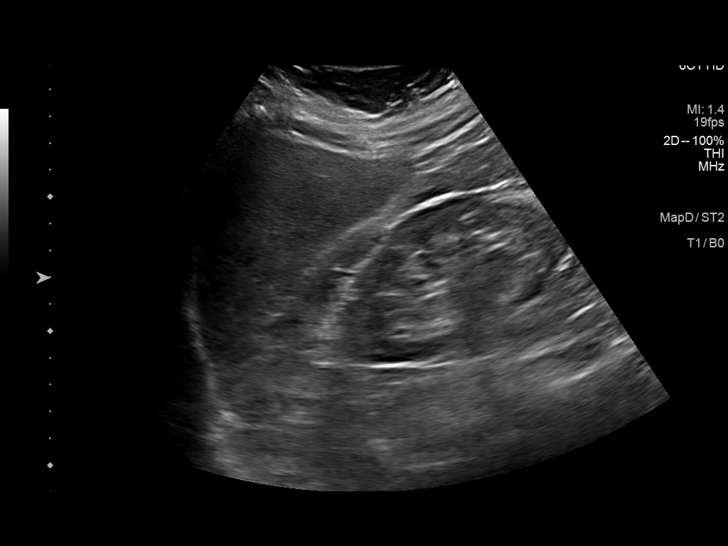
[im 3/36]
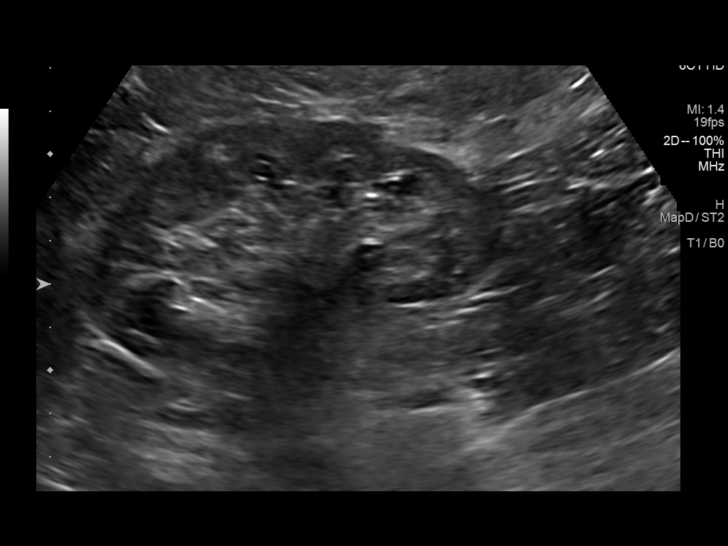
[im 6/36]
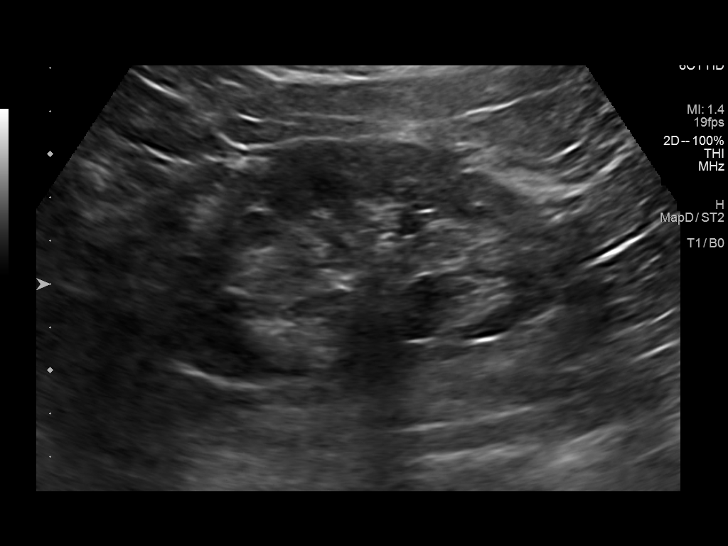
[im 9/36]
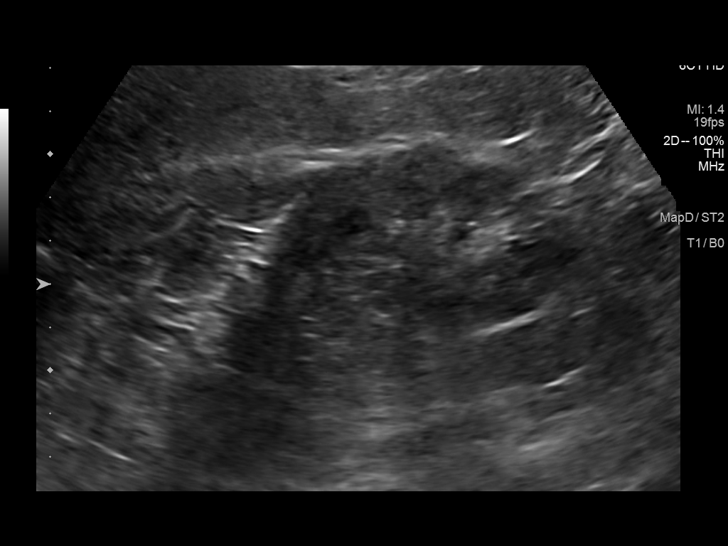
[im 12/36]
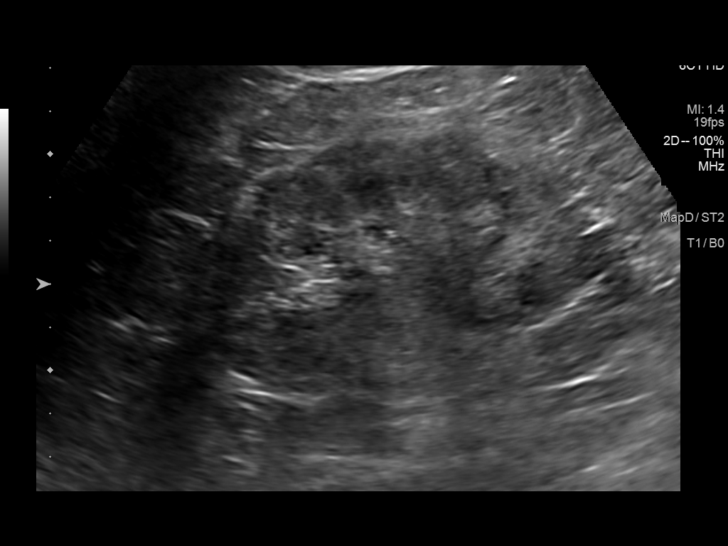
[im 14/36]
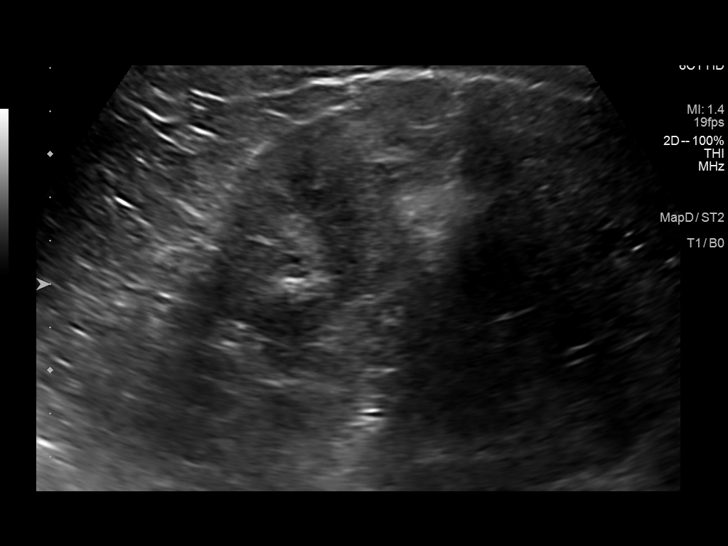
[im 17/36]
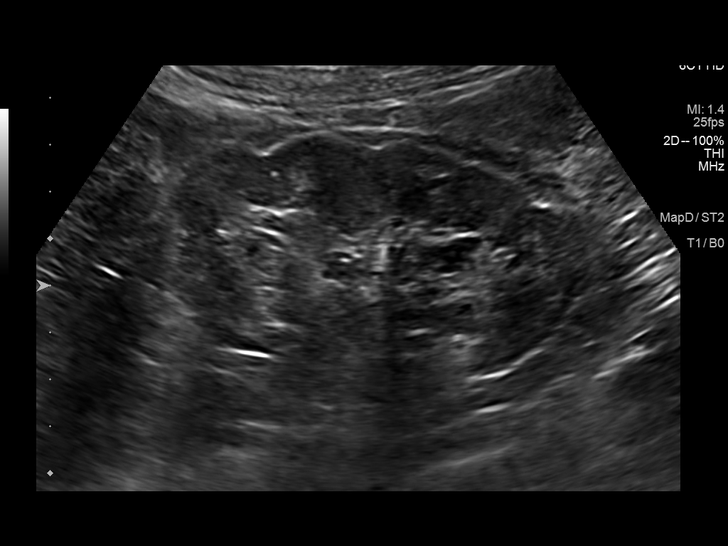
[im 19/36]
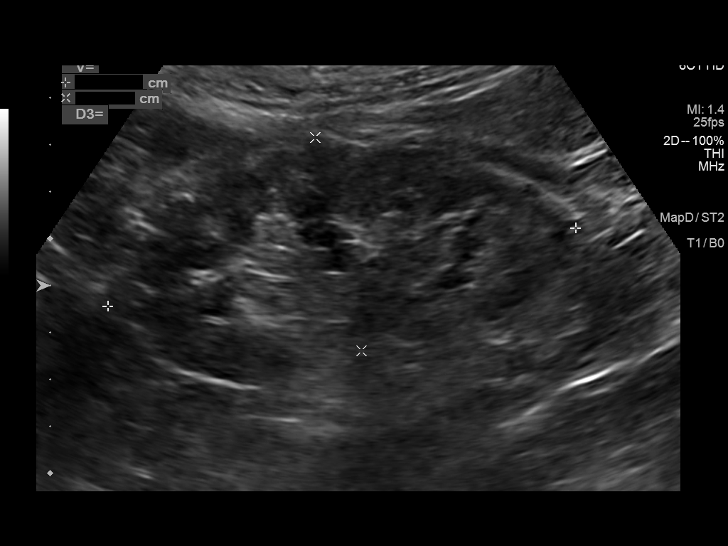
[im 22/36]
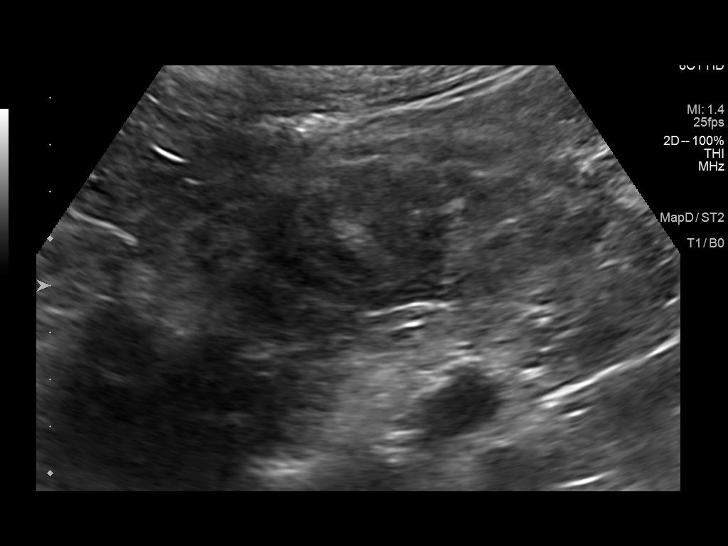
[im 24/36]
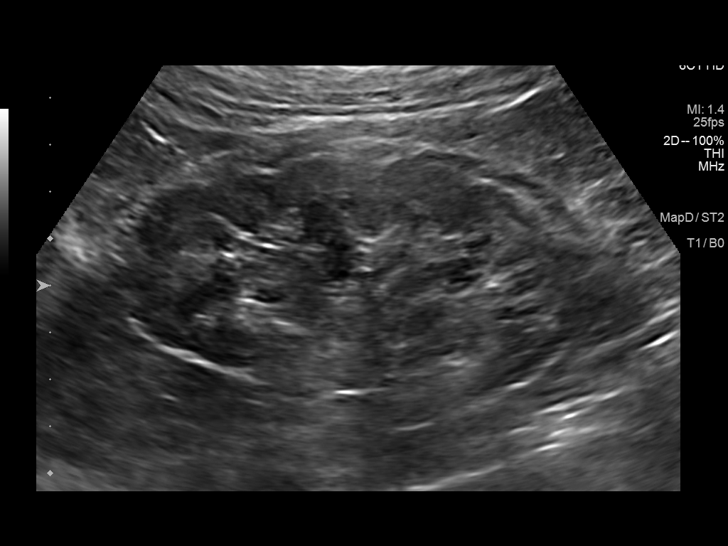
[im 27/36]
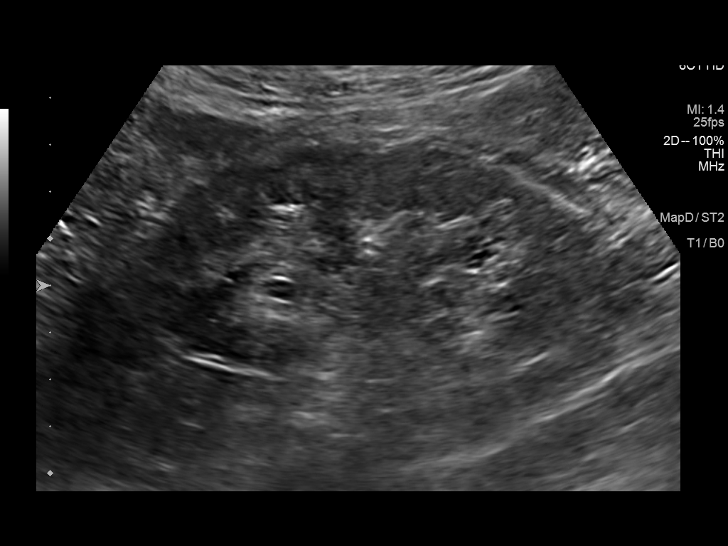
[im 30/36]
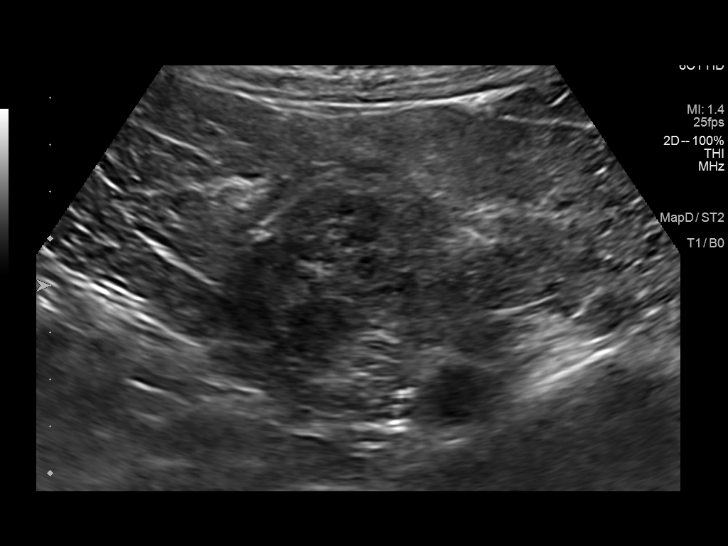
[im 33/36]
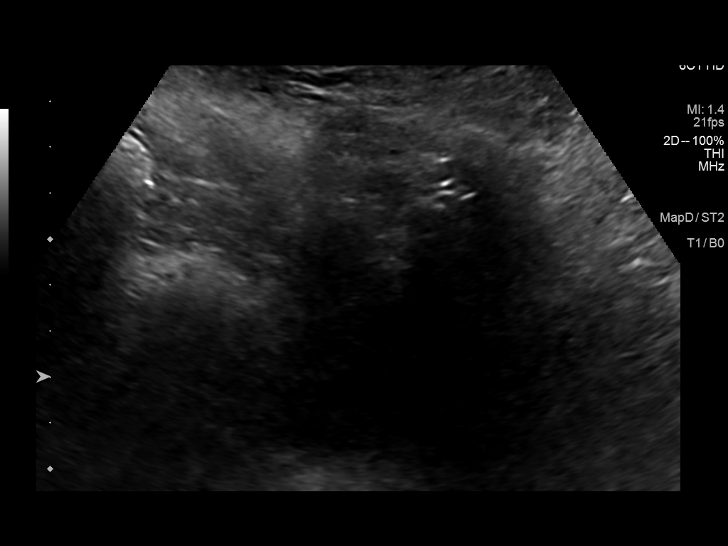
[im 36/36]
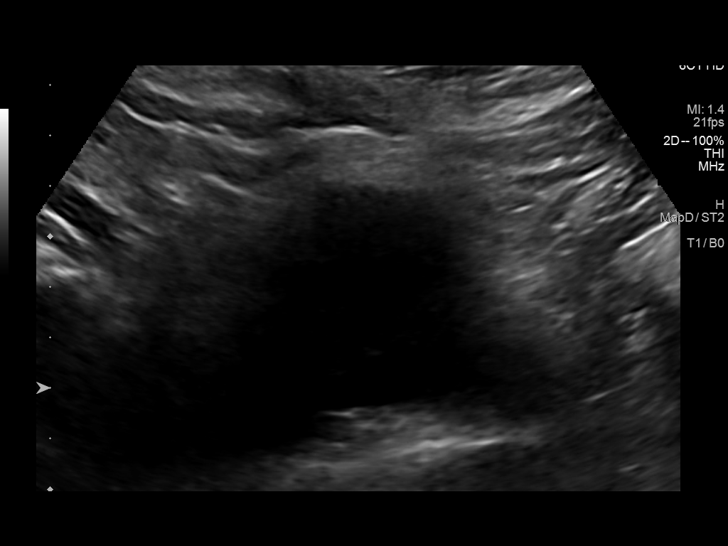

[14 of 25 positions shown; findings below may reference images not displayed]

FINDINGS: Right Kidney:

Renal measurements: 10.2 x 5.3 x 5.5 cm = volume: 155 mL. Kidneys
are mildly diffusely echogenic, unchanged. No mass or hydronephrosis
visualized.

Left Kidney:

Renal measurements: 10.1 x 4.7 x 4.6 cm = volume: 112 mL. Kidneys
are mildly diffusely echogenic, unchanged. No mass or hydronephrosis
visualized.

Bladder:

Appears normal for degree of bladder distention.

Other:

None.

1. Stable bilateral echogenic kidneys likely related to medical
renal disease.

## 2023-11-14 NOTE — Progress Notes (Unsigned)
 Cardiology Office Note   Date:  11/21/2023   ID:  Kayla Brown, DOB Nov 17, 1950, MRN [id number]  PCP:  Jess Morita, MD  Cardiologist:   Breck Maryland [place], MD   Chief Complaint  Patient presents with   Congestive Heart Failure   Shortness of Breath       History of Present Illness: Kayla Brown is a 73 y.o. female who is seen for follow up dyspnea. She has a history of OSA.  She has a history of mild pulmonary HTN based on Echo in 2016 and 2017 but not replicated on Echo in 2019 and 2020. Myoview  in August 2020 was normal. She has  HTN and dyslipidemia. Prior pulmonary work up includes PFT 04/04/15   - mild Diffusion defect, normal flows and volumes without response to bronchodilator. CT chest March 05, 2019 extensive atelectatic changes throughout the lungs with a dependent predominance, mosaic attenuation throughout the lungs, emphysema. VQ scan 03/04/2019 no unmatched segmental perfusion defects to suggest acute PE. Autoimmune work-up negative 2020. She had a 6-minute walk test that showed 267 m completed.  With no desaturations. Repeat PFTs show no airflow obstruction or restriction.  FEV1 102%, ratio 86, FVC 90% normal mid flows,, DLCO 90%.  She was admitted 9/8-9/21/20 with acute hypoxic respiratory failure. Patient developed  increased oxygen requirement on 9/10, at 8 L nasal cannula, PCCM consulted, work-up significant for atelectasis and ? pulmonary edema. BNP level was normal.   Patient improved with IV diuresis, and pulmonary PT, but unfortunately developed acute kidney injury due to diuresis. ACEi, hygroton , spironolactone   were discontinued. CT, Echo,  and CXR findings as noted below. She diuresed 20 lbs in the hospital. On subsequent follow up she was doing well.   On follow up today she is doing well. Seen with her husband. She states she has only had to take an extra lasix  once since her last visit. No increase in swelling or SOB. Does walk some but needs to do  more. No chest pain or palpitations.    Past Medical History:  Diagnosis Date   Anxiety    Congestive heart failure (CHF) (HCC)    Diabetes mellitus    Edema of extremities 03/02/2016   Essential hypertension, benign    Lazy eye    Major depressive disorder, single episode, unspecified    Obesity (BMI 30-39.9) 03/01/2016   OSA (obstructive sleep apnea)    Pulmonary HTN (HCC)    mild with PASP by echo 05/2016   Pure hypercholesterolemia    Retinopathy, due to hypertension     Past Surgical History:  Procedure Laterality Date   CPAP TITRATION  10/14/2015   DILATION AND CURETTAGE OF UTERUS  2005-2008   x2   EYE SURGERY     cataracts   WISDOM TOOTH EXTRACTION       Current Outpatient Medications  Medication Sig Dispense Refill   albuterol  (VENTOLIN  HFA) 108 (90 Base) MCG/ACT inhaler Inhale 1-2 puffs into the lungs every 6 (six) hours as needed for wheezing or shortness of breath.     Alcohol  Swabs (ALCOHOL  PREP) PADS Pt uses an alcohol  pad each time sugars are tested. Pt tests twice daily. Dx E11.9 100 each 3   amLODipine  (NORVASC ) 10 MG tablet Take 1 tablet (10 mg total) by mouth daily. 90 tablet 1   atorvastatin  (LIPITOR) 40 MG tablet Take 1 tablet (40 mg total) by mouth daily. 90 tablet 1   B Complex Vitamins (B COMPLEX PO)  Take 1 tablet by mouth daily.     BD PEN NEEDLE NANO 2ND GEN 32G X 4 MM MISC 1 APPLICATION BY DOES NOT APPLY ROUTE DAILY. 100 each 2   Blood Glucose Monitoring Suppl (FREESTYLE LITE) w/Device KIT Use to check blood glucose up to twice a day (Dx - type 2 DM with long term insulin  use - E11.65, Z79.4) 1 kit 0   buPROPion  (WELLBUTRIN  XL) 300 MG 24 hr tablet TAKE 1 TABLET BY MOUTH EVERY DAY 90 tablet 0   Calcium  Carb-Cholecalciferol  [id number] MG-UNIT CAPS Take 1 tablet by mouth 2 (two) times daily.     carvedilol  (COREG ) 25 MG tablet TAKE 1 TABLET BY MOUTH TWICE A DAY 180 tablet 2   dapagliflozin  propanediol (FARXIGA ) 10 MG TABS tablet Take 10 mg by  mouth daily.     ferrous sulfate 324 MG TBEC Take 324 mg by mouth.     fish oil-omega-3 fatty acids 1000 MG capsule Take 1 g by mouth daily.     fluticasone  (FLONASE ) 50 MCG/ACT nasal spray SPRAY 2 SPRAYS INTO EACH NOSTRIL EVERY DAY (Patient taking differently: Place 2 sprays into both nostrils daily.) 18 g 1   furosemide  (LASIX ) 20 MG tablet Take 1 tablet (20 mg total) by mouth daily. Take 1 Tablet Daily. Can Take Additional Tablet for Swelling . 90 tablet 1   glipiZIDE  (GLUCOTROL  XL) 10 MG 24 hr tablet TAKE 1 TABLET (10 MG TOTAL) BY MOUTH 2 (TWO) TIMES DAILY WITH A MEAL. 180 tablet 1   glucose blood (FREESTYLE LITE) test strip Use to check blood glucose up to twice a day (Dx - type 2 DM with long term insulin  use - E11.65, Z79.4) 100 each 12   Insulin  Glargine (BASAGLAR  KWIKPEN) 100 UNIT/ML Inject 10 Units into the skin daily. Total daily dose = 0.2-0.5 units/kg/day---> 50% Basal/50% Bolus 15 mL 1   Lancets (FREESTYLE) lancets Use to check blood glucose up to twice a day (Dx - type 2 DM with long term insulin  use - E11.65, Z79.4) 100 each 12   loratadine  (CLARITIN ) 10 MG tablet TAKE 1 TABLET BY MOUTH EVERY DAY (Patient taking differently: Take 10 mg by mouth daily.) 30 tablet 11   Magnesium Oxide 420 MG TABS Take by mouth.     Melatonin 3 MG TABS Take 10 mg by mouth at bedtime.      methocarbamol (ROBAXIN) 500 MG tablet Take by mouth.     Multiple Vitamins-Minerals (VISION FORMULA) TABS Take 1 tablet by mouth daily.     OVER THE COUNTER MEDICATION Take 1 tablet by mouth daily. Equate Vision Formula 50+     Semaglutide , 2 MG/DOSE, 8 MG/3ML SOPN Inject 2 mg as directed once a week. (Patient taking differently: Inject 1 mg as directed once a week.) 3 mL 3   sertraline  (ZOLOFT ) 50 MG tablet Take 1 tablet (50 mg total) by mouth daily. 90 tablet 1   traMADol  (ULTRAM ) 50 MG tablet Take 1 tablet by mouth every 6 (six) hours as needed.     No current facility-administered medications for this visit.     Allergies:   Patient has no known allergies.    Social History:  The patient  reports that she has never smoked. She has never used smokeless tobacco. She reports that she does not drink alcohol  and does not use drugs.   Family History:  The patient's family history includes ADD / ADHD in her mother; Alcohol  abuse in her mother; Dementia in her mother.  She was adopted.    ROS:  Please see the history of present illness.   Otherwise, review of systems are positive for none.   All other systems are reviewed and negative.    PHYSICAL EXAM: VS:  BP 126/64 (BP Location: Left Arm, Patient Position: Sitting)   Pulse 63   Ht 5' (1.524 m)   Wt 196 lb 9.6 oz (89.2 kg)   LMP 06/25/2008   SpO2 97%   BMI 38.40 kg/m  , BMI Body mass index is 38.4 kg/m. GEN: Well nourished, obese, in no acute distress  HEENT: normal  Neck: no JVD, carotid bruits, or masses Cardiac: RRR; no murmurs, rubs, or gallops,no edema  Respiratory:  clear to auscultation bilaterally, normal work of breathing GI: soft, nontender, nondistended, + BS MS: no deformity or atrophy  Skin: warm and dry, no rash Neuro:  Strength and sensation are intact Psych: euthymic mood, full affect   EKG Interpretation Date/Time:  Thursday Nov 21 2023 07:47:53 EDT Ventricular Rate:  63 PR Interval:  178 QRS Duration:  80 QT Interval:  396 QTC Calculation: 405 R Axis:   98  Text Interpretation: Sinus rhythm with marked sinus arrhythmia Rightward axis Possible Anteroseptal infarct (cited on or before 20-Dec-2021) When compared with ECG of 15-Mar-2023 08:08, Incomplete right bundle branch block is no longer Present Confirmed by [place], Melany Wiesman [phone]) on 11/21/2023 7:52:56 AM    Recent Labs: 06/11/2023: ALT 42; Hemoglobin 14.3; Platelets 227.0; TSH 1.33 09/12/2023: BUN 38; Creatinine, Ser 1.54; Potassium 4.4; Sodium 137    Lipid Panel    Component Value Date/Time   CHOL 132 06/11/2023 0803   TRIG 142.0 06/11/2023 0803   HDL  54.90 06/11/2023 0803   CHOLHDL 2 06/11/2023 0803   VLDL 28.4 06/11/2023 0803   LDLCALC 49 06/11/2023 0803   LDLCALC 52 11/18/2020 1436   LDLDIRECT 50.0 05/23/2020 1431      Wt Readings from Last 3 Encounters:  11/21/23 196 lb 9.6 oz (89.2 kg)  09/12/23 197 lb 6 oz (89.5 kg)  08/22/23 199 lb (90.3 kg)      Other studies Reviewed: Additional studies/ records that were reviewed today include:  Myoview  02/02/19: Study Highlights    Nuclear stress EF: 54%. There was no ST segment deviation noted during stress. This is a low risk study. The left ventricular ejection fraction is normal (55-65%). The study is normal.   Low risk stress nuclear study with normal perfusion and normal left ventricular regional and global systolic function.       Echo 03/06/19: IMPRESSIONS      1. The left ventricle has hyperdynamic systolic function, with an ejection fraction of >65%. The cavity size was normal. There is moderate concentric left ventricular hypertrophy. Left ventricular diastolic Doppler parameters are consistent with  impaired relaxation. Elevated mean left atrial pressure No evidence of left ventricular regional wall motion abnormalities.  2. The right ventricle has normal systolic function. The cavity was normal. There is no increase in right ventricular wall thickness. Right ventricular systolic pressure could not be assessed.  3. Left atrial size was mildly dilated.  4. Trivial pericardial effusion is present.  5. The aorta is normal unless otherwise noted.   PORTABLE CHEST 1 VIEW   COMPARISON:  Chest radiograph dated 03/05/2019   FINDINGS: Chronic interstitial coarsening and mild bronchitic changes. No focal consolidation, pleural effusion, or pneumothorax. Linear density in the left mid lung field, likely atelectatic changes. The cardiac silhouette is within normal limits. No acute  osseous pathology.   IMPRESSION: No active disease.     Electronically Signed   By:  Angus Bark M.D.   On: 03/11/2019 10:21  CT CHEST WITHOUT CONTRAST   TECHNIQUE: Multidetector CT imaging of the chest was performed following the standard protocol without IV contrast.   COMPARISON:  CT 04/05/2015   FINDINGS: Cardiovascular: Normal heart size. No pericardial effusion. Atherosclerotic calcification of the aortic leaflets is noted. There is mitral annular calcification is well. The aorta is normal caliber with atheromatous plaque. Additional calcifications are present in the proximal great vessels and upper abdominal aorta. Major venous structures are unremarkable.   Mediastinum/Nodes: Numerous though nonenlarged mediastinal nodes are present. Evaluation for hilar adenopathy is limited in the absence of contrast. No axillary adenopathy. Thyroid  gland and thoracic inlet are unremarkable. Bowing of the posterior trachea 0 likely related to imaging during exhalation. There is mild airways thickening. The esophagus is unremarkable.   Lungs/Pleura: Redemonstration of mosaic attenuation throughout the lungs superimposed on centrilobular and paraseptal emphysema. There is increasing bandlike areas of subsegmental atelectasis and/or scarring as well as additional atelectatic change posteriorly in the lung bases. No pneumothorax. No effusion. Stable subpleural area of scarring is seen in the right middle lobe.   Upper Abdomen: Small accessory splenule. Mild bilateral symmetric perinephric stranding, a nonspecific finding though may correlate with either age or decreased renal function. Portion of the hepatic flexure is interposed anterior to the liver. Fatty replacement of the pancreas. No pancreatic ductal dilatation or surrounding inflammatory changes. No acute abnormalities present in the visualized portions of the upper abdomen.   Musculoskeletal: Multilevel degenerative changes are present in the imaged portions of the spine. No acute osseous abnormality  or suspicious osseous lesion.   IMPRESSION: 1. Extensive atelectatic changes are present throughout the lungs with a dependent predominance. 2. Redemonstration of mosaic attenuation throughout the lungs superimposed on centrilobular and paraseptal emphysema, suggestive of small airways disease. 3. Mild bilateral symmetric perinephric stranding, a nonspecific finding though may correlate with either age or decreased renal function. 4. Aortic Atherosclerosis (ICD10-I70.0) and Emphysema (ICD10-J43.9).     Electronically Signed   By: Ary Bitter M.D.   On: 03/05/2019 22:41  ASSESSMENT AND PLAN:  1.  Chronic diastolic CHF. Echo with moderate LVH but normal systolic function and no evidence of pulmonary HTN. She is clinically doing well.  Continue sodium restriction. Continue current lasix  dose. May take extra for increased weight gain or swelling.    2.  Hypertension -BP is well controlled now. Continue Coreg  and amlodipine .   3.  Morbid Obesity  -I have encouraged weight loss.   4.  OSA on CPAP. followed by pulmonary now.   5. CKD stage 3. Stable. Followed by Neprhology.   Current medicines are reviewed at length with the patient today.  The patient does not have concerns regarding medicines.  The following changes have been made:  See above.  Labs/ tests ordered today include:   Orders Placed This Encounter  Procedures   EKG 12-Lead      Disposition:   FU with APP in 12  months  Signed, Caylor Cerino [place], MD  11/21/2023 8:02 AM    [facility] [address] [place], [place], [address] Phone [phone], Fax [phone]

## 2023-11-21 ENCOUNTER — Encounter: Payer: Self-pay | Admitting: Cardiology

## 2023-11-21 ENCOUNTER — Ambulatory Visit: Attending: Cardiology | Admitting: Cardiology

## 2023-11-21 ENCOUNTER — Telehealth: Payer: Self-pay

## 2023-11-21 VITALS — BP 126/64 | HR 63 | Ht 60.0 in | Wt 196.6 lb

## 2023-11-21 DIAGNOSIS — E1165 Type 2 diabetes mellitus with hyperglycemia: Secondary | ICD-10-CM | POA: Insufficient documentation

## 2023-11-21 DIAGNOSIS — I10 Essential (primary) hypertension: Secondary | ICD-10-CM | POA: Insufficient documentation

## 2023-11-21 DIAGNOSIS — I5032 Chronic diastolic (congestive) heart failure: Secondary | ICD-10-CM | POA: Insufficient documentation

## 2023-11-21 DIAGNOSIS — Z794 Long term (current) use of insulin: Secondary | ICD-10-CM | POA: Insufficient documentation

## 2023-11-21 NOTE — Patient Instructions (Signed)

## 2023-11-21 NOTE — Telephone Encounter (Signed)
 Received shipment on behalf of patient  4 boxes of Ozempic  8mg /5mL and these have been placed in the [facility] refridgerator for pick up, patient has been called to inform her these are here for pick up pt will come by some time next few days to obtain these.

## 2023-11-23 ENCOUNTER — Other Ambulatory Visit: Payer: Self-pay | Admitting: Family Medicine

## 2023-11-23 DIAGNOSIS — F32A Depression, unspecified: Secondary | ICD-10-CM

## 2023-12-06 ENCOUNTER — Ambulatory Visit (INDEPENDENT_AMBULATORY_CARE_PROVIDER_SITE_OTHER): Admitting: Podiatry

## 2023-12-06 ENCOUNTER — Encounter: Payer: Self-pay | Admitting: Podiatry

## 2023-12-06 DIAGNOSIS — M79675 Pain in left toe(s): Secondary | ICD-10-CM | POA: Diagnosis not present

## 2023-12-06 DIAGNOSIS — B351 Tinea unguium: Secondary | ICD-10-CM | POA: Diagnosis not present

## 2023-12-06 DIAGNOSIS — M79674 Pain in right toe(s): Secondary | ICD-10-CM | POA: Diagnosis not present

## 2023-12-06 DIAGNOSIS — N1832 Chronic kidney disease, stage 3b: Secondary | ICD-10-CM

## 2023-12-06 DIAGNOSIS — E1122 Type 2 diabetes mellitus with diabetic chronic kidney disease: Secondary | ICD-10-CM

## 2023-12-06 NOTE — Progress Notes (Signed)
 This patient returns to my office for at risk foot care.  This patient requires this care by a professional since this patient will be at risk due to having diabetes mellitus.  This patient is unable to cut nails herself since the patient cannot reach her nails.These nails are painful walking and wearing shoes.  This patient presents for at risk foot care today.  General Appearance  Alert, conversant and in no acute stress.  Vascular  Dorsalis pedis and posterior tibial  pulses are palpable  bilaterally.  Capillary return is within normal limits  bilaterally. Temperature is within normal limits  bilaterally.  Neurologic  Senn-Weinstein monofilament wire test within normal limits  bilaterally. Muscle power within normal limits bilaterally.  Nails Thick disfigured discolored nails with subungual debris  from hallux to fifth toes bilaterally. No evidence of bacterial infection or drainage bilaterally.  Orthopedic  No limitations of motion  feet .  No crepitus or effusions noted.  No bony pathology or digital deformities noted.  HAV  B/L.  Hammer toes  B/L.  Skin  normotropic skin with no porokeratosis noted bilaterally.  No signs of infections or ulcers noted.     Onychomycosis  Pain in right toes  Pain in left toes  Consent was obtained for treatment procedures.   Mechanical debridement of nails 1-5  bilaterally performed with a nail nipper.  Filed with dremel without incident.    Return office visit   3   mos.                  Told patient to return for periodic foot care and evaluation due to potential at risk complications.   Helane Gunther DPM

## 2023-12-13 ENCOUNTER — Encounter: Payer: Self-pay | Admitting: Family Medicine

## 2023-12-13 ENCOUNTER — Ambulatory Visit (INDEPENDENT_AMBULATORY_CARE_PROVIDER_SITE_OTHER): Admitting: Family Medicine

## 2023-12-13 VITALS — BP 104/68 | HR 69 | Temp 98.0°F | Wt 195.2 lb

## 2023-12-13 DIAGNOSIS — E785 Hyperlipidemia, unspecified: Secondary | ICD-10-CM

## 2023-12-13 DIAGNOSIS — E1165 Type 2 diabetes mellitus with hyperglycemia: Secondary | ICD-10-CM

## 2023-12-13 DIAGNOSIS — I10 Essential (primary) hypertension: Secondary | ICD-10-CM

## 2023-12-13 DIAGNOSIS — Z794 Long term (current) use of insulin: Secondary | ICD-10-CM

## 2023-12-13 LAB — CBC WITH DIFFERENTIAL/PLATELET
Basophils Absolute: 0 10*3/uL (ref 0.0–0.1)
Basophils Relative: 0.7 % (ref 0.0–3.0)
Eosinophils Absolute: 0.2 10*3/uL (ref 0.0–0.7)
Eosinophils Relative: 2.6 % (ref 0.0–5.0)
HCT: 44.1 % (ref 36.0–46.0)
Hemoglobin: 14.6 g/dL (ref 12.0–15.0)
Lymphocytes Relative: 23.5 % (ref 12.0–46.0)
Lymphs Abs: 1.8 10*3/uL (ref 0.7–4.0)
MCHC: 33 g/dL (ref 30.0–36.0)
MCV: 95.4 fl (ref 78.0–100.0)
Monocytes Absolute: 0.9 10*3/uL (ref 0.1–1.0)
Monocytes Relative: 11.6 % (ref 3.0–12.0)
Neutro Abs: 4.6 10*3/uL (ref 1.4–7.7)
Neutrophils Relative %: 61.6 % (ref 43.0–77.0)
Platelets: 240 10*3/uL (ref 150.0–400.0)
RBC: 4.63 Mil/uL (ref 3.87–5.11)
RDW: 14 % (ref 11.5–15.5)
WBC: 7.5 10*3/uL (ref 4.0–10.5)

## 2023-12-13 LAB — LIPID PANEL
Cholesterol: 141 mg/dL (ref 0–200)
HDL: 55.6 mg/dL (ref >39.00)
LDL Cholesterol: 63 mg/dL (ref 0–99)
NonHDL: 85.51
Total CHOL/HDL Ratio: 3
Triglycerides: 112 mg/dL (ref 0.0–149.0)
VLDL: 22.4 mg/dL (ref 0.0–40.0)

## 2023-12-13 LAB — BASIC METABOLIC PANEL WITH GFR
BUN: 38 mg/dL — ABNORMAL HIGH (ref 6–23)
CO2: 27 meq/L (ref 19–32)
Calcium: 10.1 mg/dL (ref 8.4–10.5)
Chloride: 102 meq/L (ref 96–112)
Creatinine, Ser: 1.57 mg/dL — ABNORMAL HIGH (ref 0.40–1.20)
GFR: 32.94 mL/min — ABNORMAL LOW (ref >60.00)
Glucose, Bld: 234 mg/dL — ABNORMAL HIGH (ref 70–99)
Potassium: 4.2 meq/L (ref 3.5–5.1)
Sodium: 139 meq/L (ref 135–145)

## 2023-12-13 LAB — TSH: TSH: 1.38 u[IU]/mL (ref 0.35–5.50)

## 2023-12-13 LAB — HEPATIC FUNCTION PANEL
ALT: 33 U/L (ref 0–35)
AST: 22 U/L (ref 0–37)
Albumin: 4.3 g/dL (ref 3.5–5.2)
Alkaline Phosphatase: 86 U/L (ref 39–117)
Bilirubin, Direct: 0.1 mg/dL (ref 0.0–0.3)
Total Bilirubin: 0.4 mg/dL (ref 0.2–1.2)
Total Protein: 6.9 g/dL (ref 6.0–8.3)

## 2023-12-13 LAB — HEMOGLOBIN A1C: Hgb A1c MFr Bld: 8.4 % — ABNORMAL HIGH (ref 4.6–6.5)

## 2023-12-13 MED ORDER — TRAMADOL HCL 50 MG PO TABS: 50.0000 mg | ORAL_TABLET | Freq: Four times a day (QID) | ORAL | 0 refills | Status: DC | PRN

## 2023-12-13 NOTE — Assessment & Plan Note (Signed)
 Chronic problem.  On Amlodipine  10mg  daily, Coreg  BID, and Lasix  w/ excellent BP control.  Currently asymptomatic.  Check labs due to diuretic use but no anticipated med changes.  Will follow

## 2023-12-13 NOTE — Patient Instructions (Signed)
 Follow up in 3-4 months to recheck sugar We'll notify you of your lab results and make any changes if needed Continue to work on healthy diet and regular exercise- you can do it!!! Call with any questions or concerns Stay Safe!  Stay Healthy! Have a great summer!!!

## 2023-12-13 NOTE — Assessment & Plan Note (Signed)
 Ongoing issue.  Weight and BMI are stable.  Encouraged regular physical activity.  Will follow.

## 2023-12-13 NOTE — Progress Notes (Signed)
   Subjective:    Patient ID: Kayla Brown, female    DOB: 1951/01/13, 73 y.o.   MRN: [id number]  HPI DM- chronic problem, on Ozempic  1mg  weekly, Glargine 10 units daily, Glipizide  10mg  BID, Farxiga  10mg .  UTD on eye exam, foot exam, microalbumin.  No numbness/tingling of hands/feet.  HTN- chronic problem, on Amlodipine  10mg  daily, Coreg  25mg  BID, Lasix  20mg  daily w/ excellent control.  No CP, SOB, HA's, visual changes, edema.  Hyperlipidemia- chronic problem, on Lipitor 40mg  daily.  No abd pain, N/V.   Review of Systems For ROS see HPI     Objective:   Physical Exam Vitals reviewed.  Constitutional:      General: She is not in acute distress.    Appearance: Normal appearance. She is well-developed. She is obese. She is not ill-appearing.  HENT:     Head: Normocephalic and atraumatic.   Eyes:     Conjunctiva/sclera: Conjunctivae normal.     Pupils: Pupils are equal, round, and reactive to light.   Neck:     Thyroid : No thyromegaly.   Cardiovascular:     Rate and Rhythm: Normal rate and regular rhythm.     Pulses: Normal pulses.     Heart sounds: Normal heart sounds. No murmur heard. Pulmonary:     Effort: Pulmonary effort is normal. No respiratory distress.     Breath sounds: Normal breath sounds.  Abdominal:     General: There is no distension.     Palpations: Abdomen is soft.     Tenderness: There is no abdominal tenderness.   Musculoskeletal:     Cervical back: Normal range of motion and neck supple.     Right lower leg: No edema.     Left lower leg: No edema.  Lymphadenopathy:     Cervical: No cervical adenopathy.   Skin:    General: Skin is warm and dry.   Neurological:     General: No focal deficit present.     Mental Status: She is alert and oriented to person, place, and time.   Psychiatric:        Mood and Affect: Mood normal.        Behavior: Behavior normal.        Thought Content: Thought content normal.           Assessment & Plan:

## 2023-12-13 NOTE — Assessment & Plan Note (Signed)
 Chronic problem.  On Lipitor 40mg  daily w/o difficulty.  Check labs.  Adjust meds prn  ?

## 2023-12-13 NOTE — Assessment & Plan Note (Signed)
 Chronic problem.  On Ozempic , Glargine, Glipizide , and Farxiga .  Typically good control.  UTD on eye exam, foot exam, microalbumin.  Check labs.  Adjust meds prn

## 2023-12-16 ENCOUNTER — Other Ambulatory Visit: Payer: Self-pay | Admitting: Family Medicine

## 2023-12-16 ENCOUNTER — Encounter: Payer: Self-pay | Admitting: Family Medicine

## 2023-12-17 ENCOUNTER — Ambulatory Visit: Payer: Self-pay | Admitting: Family Medicine

## 2023-12-19 ENCOUNTER — Other Ambulatory Visit: Payer: Self-pay | Admitting: Family Medicine

## 2024-01-22 DIAGNOSIS — E785 Hyperlipidemia, unspecified: Secondary | ICD-10-CM | POA: Diagnosis not present

## 2024-01-22 DIAGNOSIS — N1831 Chronic kidney disease, stage 3a: Secondary | ICD-10-CM | POA: Diagnosis not present

## 2024-01-22 DIAGNOSIS — N189 Chronic kidney disease, unspecified: Secondary | ICD-10-CM | POA: Diagnosis not present

## 2024-01-22 DIAGNOSIS — D631 Anemia in chronic kidney disease: Secondary | ICD-10-CM | POA: Diagnosis not present

## 2024-01-22 DIAGNOSIS — E1122 Type 2 diabetes mellitus with diabetic chronic kidney disease: Secondary | ICD-10-CM | POA: Diagnosis not present

## 2024-01-22 DIAGNOSIS — N2581 Secondary hyperparathyroidism of renal origin: Secondary | ICD-10-CM | POA: Diagnosis not present

## 2024-02-12 ENCOUNTER — Telehealth: Payer: Self-pay

## 2024-02-12 NOTE — Telephone Encounter (Signed)
 Type of form received: [employer] application  Additional comments:   Received by: Fax  [employer] should be Faxed/mailed to: [phone]  Is patient requesting call for pickup: No  Form placed:  in provider sign folder at [facility]  Attach charge sheet.  Provider will determine charge.  Individual made aware of 3-5 business day turn around No?

## 2024-02-13 NOTE — Telephone Encounter (Signed)
 Faxed forms to [employer]. Placed in provider folder up front.

## 2024-02-13 NOTE — Telephone Encounter (Signed)
 Form completed and returned to [place]

## 2024-02-21 ENCOUNTER — Other Ambulatory Visit: Payer: Self-pay | Admitting: Family Medicine

## 2024-03-04 ENCOUNTER — Ambulatory Visit (INDEPENDENT_AMBULATORY_CARE_PROVIDER_SITE_OTHER): Payer: Medicare Other

## 2024-03-04 VITALS — Ht <= 58 in | Wt 195.0 lb

## 2024-03-04 DIAGNOSIS — Z0000 Encounter for general adult medical examination without abnormal findings: Secondary | ICD-10-CM

## 2024-03-04 DIAGNOSIS — Z780 Asymptomatic menopausal state: Secondary | ICD-10-CM | POA: Diagnosis not present

## 2024-03-04 NOTE — Progress Notes (Signed)
 Subjective:   Kayla Brown is a 73 y.o. who presents for a Medicare Wellness preventive visit.  As a reminder, Annual Wellness Visits don't include a physical exam, and some assessments may be limited, especially if this visit is performed virtually. We may recommend an in-person follow-up visit with your provider if needed.  Visit Complete: Virtual I connected with  Boby GORMAN Benders on 03/04/24 by a audio enabled telemedicine application and verified that I am speaking with the correct person using two identifiers.  Patient Location: Home  Provider Location: Home Office  I discussed the limitations of evaluation and management by telemedicine. The patient expressed understanding and agreed to proceed.  Vital Signs: Because this visit was a virtual/telehealth visit, some criteria may be missing or patient reported. Any vitals not documented were not able to be obtained and vitals that have been documented are patient reported.  VideoDeclined- This patient declined Librarian, academic. Therefore the visit was completed with audio only.  Persons Participating in Visit: Patient.  AWV Questionnaire: 03/02/2024  Cardiac Risk Factors include: advanced age (>70men, >62 women);hypertension;dyslipidemia;diabetes mellitus;obesity (BMI >30kg/m2)     Objective:    Today's Vitals   03/04/24 0811  Weight: 195 lb (88.5 kg)  Height: 4' 8 (1.422 m)   Body mass index is 43.72 kg/m.     03/04/2024    8:18 AM 02/07/2023    8:43 AM 02/01/2022    8:23 AM 12/20/2021   12:11 PM 12/12/2020    9:49 AM 10/26/2020   10:45 AM 08/10/2020   10:33 AM  Advanced Directives  Does Patient Have a Medical Advance Directive? No No No No  No No  Would patient like information on creating a medical advance directive?  No - Patient declined No - Patient declined Yes (ED - Information included in AVS) No - Patient declined No - Patient declined No - Patient declined    Current  Medications (verified) Outpatient Encounter Medications as of 03/04/2024  Medication Sig   albuterol  (VENTOLIN  HFA) 108 (90 Base) MCG/ACT inhaler Inhale 1-2 puffs into the lungs every 6 (six) hours as needed for wheezing or shortness of breath.   Alcohol  Swabs (ALCOHOL  PREP) PADS Pt uses an alcohol  pad each time sugars are tested. Pt tests twice daily. Dx E11.9   amLODipine  (NORVASC ) 10 MG tablet TAKE 1 TABLET BY MOUTH EVERY DAY   atorvastatin  (LIPITOR) 40 MG tablet TAKE 1 TABLET BY MOUTH EVERY DAY   B Complex Vitamins (B COMPLEX PO) Take 1 tablet by mouth daily.   BD PEN NEEDLE NANO 2ND GEN 32G X 4 MM MISC 1 APPLICATION BY DOES NOT APPLY ROUTE DAILY.   Blood Glucose Monitoring Suppl (FREESTYLE LITE) w/Device KIT Use to check blood glucose up to twice a day (Dx - type 2 DM with long term insulin  use - E11.65, Z79.4)   buPROPion  (WELLBUTRIN  XL) 300 MG 24 hr tablet TAKE 1 TABLET BY MOUTH EVERY DAY   Calcium  Carb-Cholecalciferol  [phone] MG-UNIT CAPS Take 1 tablet by mouth 2 (two) times daily.   carvedilol  (COREG ) 25 MG tablet TAKE 1 TABLET BY MOUTH TWICE A DAY   dapagliflozin  propanediol (FARXIGA ) 10 MG TABS tablet Take 10 mg by mouth daily.   ferrous sulfate 324 MG TBEC Take 324 mg by mouth.   fish oil-omega-3 fatty acids 1000 MG capsule Take 1 g by mouth daily.   fluticasone  (FLONASE ) 50 MCG/ACT nasal spray SPRAY 2 SPRAYS INTO EACH NOSTRIL EVERY DAY (Patient taking differently:  Place 2 sprays into both nostrils daily.)   furosemide  (LASIX ) 20 MG tablet TAKE 1 TABLET BY MOUTH EVERY DAY MAY TAKE 1 ADDITIONAL TABLET AS NEEDED FOR SWELLING   glipiZIDE  (GLUCOTROL  XL) 10 MG 24 hr tablet TAKE 1 TABLET (10 MG TOTAL) BY MOUTH 2 (TWO) TIMES DAILY WITH A MEAL.   glucose blood (FREESTYLE LITE) test strip Use to check blood glucose up to twice a day (Dx - type 2 DM with long term insulin  use - E11.65, Z79.4)   Insulin  Glargine (BASAGLAR  KWIKPEN) 100 UNIT/ML Inject 10 Units into the skin daily. Total daily dose  = 0.2-0.5 units/kg/day---> 50% Basal/50% Bolus   Lancets (FREESTYLE) lancets Use to check blood glucose up to twice a day (Dx - type 2 DM with long term insulin  use - E11.65, Z79.4)   loratadine  (CLARITIN ) 10 MG tablet TAKE 1 TABLET BY MOUTH EVERY DAY   Magnesium Oxide 420 MG TABS Take by mouth.   Melatonin 3 MG TABS Take 10 mg by mouth at bedtime.    methocarbamol (ROBAXIN) 500 MG tablet Take by mouth.   Multiple Vitamins-Minerals (VISION FORMULA) TABS Take 1 tablet by mouth daily.   OVER THE COUNTER MEDICATION Take 1 tablet by mouth daily. Equate Vision Formula 50+   Semaglutide , 2 MG/DOSE, 8 MG/3ML SOPN Inject 2 mg as directed once a week. (Patient taking differently: Inject 2 mg as directed once a week. 1 mg injection)   sertraline  (ZOLOFT ) 50 MG tablet TAKE 1 TABLET BY MOUTH EVERY DAY   traMADol  (ULTRAM ) 50 MG tablet Take 1 tablet (50 mg total) by mouth every 6 (six) hours as needed.   No facility-administered encounter medications on file as of 03/04/2024.    Allergies (verified) Patient has no known allergies.   History: Past Medical History:  Diagnosis Date   Anxiety    Congestive heart failure (CHF) (HCC)    Diabetes mellitus    Edema of extremities 03/02/2016   Essential hypertension, benign    Lazy eye    Major depressive disorder, single episode, unspecified    Obesity (BMI 30-39.9) 03/01/2016   OSA (obstructive sleep apnea)    Pulmonary HTN (HCC)    mild with PASP by echo 05/2016   Pure hypercholesterolemia    Retinopathy, due to hypertension    Past Surgical History:  Procedure Laterality Date   CPAP TITRATION  10/14/2015   DILATION AND CURETTAGE OF UTERUS  2005-2008   x2   EYE SURGERY     cataracts   WISDOM TOOTH EXTRACTION     Family History  Adopted: Yes  Problem Relation Age of Onset   Alcohol  abuse Mother    ADD / ADHD Mother    Dementia Mother    Breast cancer Neg Hx    Social History   Socioeconomic History   Marital status: Married     Spouse name: Tonna   Number of children: Not on file   Years of education: Not on file   Highest education level: 12th grade  Occupational History   Occupation: retired  Tobacco Use   Smoking status: Never   Smokeless tobacco: Never  Vaping Use   Vaping status: Never Used  Substance and Sexual Activity   Alcohol  use: No    Alcohol /week: 0.0 standard drinks of alcohol    Drug use: No   Sexual activity: Yes    Partners: Male    Birth control/protection: None  Other Topics Concern   Not on file  Social History Narrative  Lives with husband/2025   Social Drivers of Health   Financial Resource Strain: Low Risk  (03/04/2024)   Overall Financial Resource Strain (CARDIA)    Difficulty of Paying Living Expenses: Not hard at all  Food Insecurity: No Food Insecurity (03/04/2024)   Hunger Vital Sign    Worried About Running Out of Food in the Last Year: Never true    Ran Out of Food in the Last Year: Never true  Transportation Needs: No Transportation Needs (03/04/2024)   PRAPARE - Administrator, Civil Service (Medical): No    Lack of Transportation (Non-Medical): No  Physical Activity: Insufficiently Active (03/04/2024)   Exercise Vital Sign    Days of Exercise per Week: 3 days    Minutes of Exercise per Session: 10 min  Stress: No Stress Concern Present (03/04/2024)   [employer] of Occupational Health - Occupational Stress Questionnaire    Feeling of Stress: Not at all  Social Connections: Unknown (03/04/2024)   Social Connection and Isolation Panel    Frequency of Communication with Friends and Family: Once a week    Frequency of Social Gatherings with Friends and Family: Not on file    Attends Religious Services: More than 4 times per year    Active Member of [employer] or Organizations: Yes    Attends Banker Meetings: Never    Marital Status: Married    Tobacco Counseling Counseling given: Not Answered    Clinical Intake:  Pre-visit  preparation completed: Yes  Pain : No/denies pain     BMI - recorded: 43.72 Nutritional Status: BMI > 30  Obese Nutritional Risks: None Diabetes: Yes CBG done?: No CBG resulted in Enter/ Edit results?: No Did pt. bring in CBG monitor from home?: No  Lab Results  Component Value Date   HGBA1C 8.4 (H) 12/13/2023   HGBA1C 8.3 (H) 09/12/2023   HGBA1C 8.5 (H) 06/11/2023     How often do you need to have someone help you when you read instructions, pamphlets, or other written materials from your doctor or pharmacy?: 1 - Never  Interpreter Needed?: No  Information entered by :: Ettel Albergo, RMA   Activities of Daily Living     03/02/2024    7:14 AM  In your present state of health, do you have any difficulty performing the following activities:  Hearing? 1  Comment wears hearing aides  Vision? 0  Difficulty concentrating or making decisions? 0  Walking or climbing stairs? 1  Dressing or bathing? 0  Doing errands, shopping? 0  Preparing Food and eating ? N  Using the Toilet? N  In the past six months, have you accidently leaked urine? Y  Do you have problems with loss of bowel control? N  Managing your Medications? N  Managing your Finances? N  Housekeeping or managing your Housekeeping? N    Patient Care Team: Mahlon Comer BRAVO, MD as PCP - General (Family Medicine) [place], [name], MD as PCP - Cardiology (Cardiology) Celestia Agent, MD (Inactive) as Consulting Physician (Gastroenterology) Cleotilde Ronal RAMAN, MD as Consulting Physician (Gynecology) [place], [name], MD as Consulting Physician (Cardiology)  I have updated your Care Teams any recent Medical Services you may have received from other providers in the past year.     Assessment:   This is a routine wellness examination for Sheronica.  Hearing/Vision screen Hearing Screening - Comments:: wears hearing aides Vision Screening - Comments:: Wears eyeglasses/Dr. Waylan   Goals Addressed   None  Depression Screen     03/04/2024    8:20 AM 12/13/2023    7:50 AM 12/13/2023    7:49 AM 06/11/2023    8:08 AM 03/07/2023    8:27 AM 02/07/2023    8:42 AM 12/05/2022    8:01 AM  PHQ 2/9 Scores  PHQ - 2 Score 1 2 2 2 1  0 0  PHQ- 9 Score 3 6 6 5 5 1  0    Fall Risk     03/02/2024    7:14 AM 12/13/2023    7:49 AM 09/12/2023    8:02 AM 08/22/2023   10:13 AM 06/11/2023    7:42 AM  Fall Risk   Falls in the past year? 1 0 0  0  Number falls in past yr: 0  0 1 0  Injury with Fall? 0  0 0 0  Risk for fall due to :  No Fall Risks No Fall Risks  No Fall Risks  Follow up  Falls evaluation completed   Falls evaluation completed    MEDICARE RISK AT HOME:  Medicare Risk at Home Any stairs in or around the home?: (Patient-Rptd) No Home free of loose throw rugs in walkways, pet beds, electrical cords, etc?: (Patient-Rptd) Yes Adequate lighting in your home to reduce risk of falls?: (Patient-Rptd) Yes Life alert?: (Patient-Rptd) No Use of a cane, walker or w/c?: (Patient-Rptd) Yes Grab bars in the bathroom?: (Patient-Rptd) Yes Shower chair or bench in shower?: (Patient-Rptd) Yes Elevated toilet seat or a handicapped toilet?: (Patient-Rptd) No  TIMED UP AND GO:  Was the test performed?  No  Cognitive Function: Declined/Normal: No cognitive concerns noted by patient or family. Patient alert, oriented, able to answer questions appropriately and recall recent events. No signs of memory loss or confusion.        02/07/2023    8:39 AM 02/01/2022    8:27 AM  6CIT Screen  What Year? 0 points 0 points  What month? 0 points 0 points  What time? 0 points 0 points  Count back from 20 0 points 0 points  Months in reverse 0 points 0 points  Repeat phrase 0 points 0 points  Total Score 0 points 0 points    Immunizations Immunization History  Administered Date(s) Administered   Fluad Quad(high Dose 65+) 02/16/2019, 03/28/2020, 04/26/2021   Fluad Trivalent(High Dose 65+) 03/07/2023   INFLUENZA,  HIGH DOSE SEASONAL PF 04/30/2022   Influenza,inj,Quad PF,6+ Mos 04/24/2016, 03/04/2017, 03/20/2018   Moderna Covid-19 Fall Seasonal Vaccine 55yrs & older 03/15/2023   PFIZER(Purple Top)SARS-COV-2 Vaccination 09/24/2019, 10/15/2019, 04/25/2020   Pneumococcal Conjugate-13 07/06/2016   Pneumococcal Polysaccharide-23 07/01/2017   Tdap 01/12/2023   Zoster Recombinant(Shingrix) 01/12/2023    Screening Tests Health Maintenance  Topic Date Due   Zoster Vaccines- Shingrix (2 of 2) 03/09/2023   Influenza Vaccine  01/24/2024   Medicare Annual Wellness (AWV)  02/07/2024   COVID-19 Vaccine (5 - 2024-25 season) 02/24/2024   FOOT EXAM  03/06/2024   HEMOGLOBIN A1C  06/13/2024   MAMMOGRAM  07/28/2024   OPHTHALMOLOGY EXAM  08/25/2024   Diabetic kidney evaluation - Urine ACR  09/11/2024   Diabetic kidney evaluation - eGFR measurement  12/12/2024   Colonoscopy  12/15/2026   DTaP/Tdap/Td (2 - Td or Tdap) 01/11/2033   Pneumococcal Vaccine: 50+ Years  Completed   DEXA SCAN  Completed   Hepatitis C Screening  Completed   HPV VACCINES  Aged Out   Meningococcal B Vaccine  Aged Out  Health Maintenance Items Addressed: DEXA ordered, See Nurse Notes at the end of this note  Additional Screening:  Vision Screening: Recommended annual ophthalmology exams for early detection of glaucoma and other disorders of the eye. Is the patient up to date with their annual eye exam?  No  Who is the provider or what is the name of the office in which the patient attends annual eye exams? Dr. Waylan  Dental Screening: Recommended annual dental exams for proper oral hygiene  Community Resource Referral / Chronic Care Management: CRR required this visit?  No   CCM required this visit?  No   Plan:    I have personally reviewed and noted the following in the patient's chart:   Medical and social history Use of alcohol , tobacco or illicit drugs  Current medications and supplements including opioid  prescriptions. Patient is currently taking opioid prescriptions. Information provided to patient regarding non-opioid alternatives. Patient advised to discuss non-opioid treatment plan with their provider. Functional ability and status Nutritional status Physical activity Advanced directives List of other physicians Hospitalizations, surgeries, and ER visits in previous 12 months Vitals Screenings to include cognitive, depression, and falls Referrals and appointments  In addition, I have reviewed and discussed with patient certain preventive protocols, quality metrics, and best practice recommendations. A written personalized care plan for preventive services as well as general preventive health recommendations were provided to patient.   Rudolf Blizard L Bryen Hinderman, CMA   03/04/2024   After Visit Summary: (MyChart) Due to this being a telephonic visit, the after visit summary with patients personalized plan was offered to patient via MyChart   Notes: Patient is due for a DEXA and order has been placed today.  She is due for a Flu shot and would like to get it done during her next office visit.  Patient is also due for a 2nd Shingrix vaccine.  Patient had no other concerns to address today.

## 2024-03-04 NOTE — Patient Instructions (Addendum)
 Ms. Barradas,  Thank you for taking the time for your Medicare Wellness Visit. I appreciate your continued commitment to your health goals. Please review the care plan we discussed, and feel free to reach out if I can assist you further.  Medicare recommends these wellness visits once per year to help you and your care team stay ahead of potential health issues. These visits are designed to focus on prevention, allowing your provider to concentrate on managing your acute and chronic conditions during your regular appointments.  Please note that Annual Wellness Visits do not include a physical exam. Some assessments may be limited, especially if the visit was conducted virtually. If needed, we may recommend a separate in-person follow-up with your provider.  Ongoing Care Seeing your primary care provider every 3 to 6 months helps us  monitor your health and provide consistent, personalized care. Next office visit on 03/12/2024.  You are due a 2nd Shingles vaccine and can get that done at your local pharmacy.  You are due a Flu vaccine and can get that done during your next office visit.  Referrals If a referral was made during today's visit and you haven't received any updates within two weeks, please contact the referred provider directly to check on the status.  You have an order for: [x]   Bone Density     Please call for appointment:  [facility] [address] [place], [place] [address] [phone]  Make sure to wear two-piece clothing.  No lotions, powders, or deodorants the day of the appointment. Make sure to bring picture ID and insurance card.  Bring list of medications you are currently taking including any supplements.    Recommended Screenings:  Health Maintenance  Topic Date Due   Zoster (Shingles) Vaccine (2 of 2) 03/09/2023   Flu Shot  01/24/2024   Medicare Annual Wellness Visit  02/07/2024   COVID-19 Vaccine (5 - 2024-25 season) 02/24/2024    Complete foot exam   03/06/2024   Hemoglobin A1C  06/13/2024   Mammogram  07/28/2024   Eye exam for diabetics  08/25/2024   Yearly kidney health urinalysis for diabetes  09/11/2024   Yearly kidney function blood test for diabetes  12/12/2024   Colon Cancer Screening  12/15/2026   DTaP/Tdap/Td vaccine (2 - Td or Tdap) 01/11/2033   Pneumococcal Vaccine for age over 66  Completed   DEXA scan (bone density measurement)  Completed   Hepatitis C Screening  Completed   HPV Vaccine  Aged Out   Meningitis B Vaccine  Aged Out       03/04/2024    8:18 AM  Advanced Directives  Does Patient Have a Medical Advance Directive? No   Advance Care Planning is important because it: Ensures you receive medical care that aligns with your values, goals, and preferences. Provides guidance to your family and loved ones, reducing the emotional burden of decision-making during critical moments.  Vision: Annual vision screenings are recommended for early detection of glaucoma, cataracts, and diabetic retinopathy. These exams can also reveal signs of chronic conditions such as diabetes and high blood pressure.  Dental: Annual dental screenings help detect early signs of oral cancer, gum disease, and other conditions linked to overall health, including heart disease and diabetes.  Please see the attached documents for additional preventive care recommendations.  Managing Pain Without Opioids Opioids are strong medicines used to treat moderate to severe pain. For some people, especially those who have long-term (chronic) pain,  opioids may not be the best choice for pain management due to: Side effects like nausea, constipation, and sleepiness. The risk of addiction (opioid use disorder). The longer you take opioids, the greater your risk of addiction. Pain that lasts for more than 3 months is called chronic pain. Managing chronic pain usually requires more than one approach and is often provided by a team of  health care providers working together (multidisciplinary approach). Pain management may be done at a pain management center or pain clinic. How to manage pain without the use of opioids Use non-opioid medicines Non-opioid medicines for pain may include: Over-the-counter or prescription non-steroidal anti-inflammatory drugs (NSAIDs). These may be the first medicines used for pain. They work well for muscle and bone pain, and they reduce swelling. Acetaminophen . This over-the-counter medicine may work well for milder pain but not swelling. Antidepressants. These may be used to treat chronic pain. A certain type of antidepressant (tricyclics) is often used. These medicines are given in lower doses for pain than when used for depression. Anticonvulsants. These are usually used to treat seizures but may also reduce nerve (neuropathic) pain. Muscle relaxants. These relieve pain caused by sudden muscle tightening (spasms). You may also use a pain medicine that is applied to the skin as a patch, cream, or gel (topical analgesic), such as a numbing medicine. These may cause fewer side effects than medicines taken by mouth. Do certain therapies as directed Some therapies can help with pain management. They include: Physical therapy. You will do exercises to gain strength and flexibility. A physical therapist may teach you exercises to move and stretch parts of your body that are weak, stiff, or painful. You can learn these exercises at physical therapy visits and practice them at home. Physical therapy may also involve: Massage. Heat wraps or applying heat or cold to affected areas. Electrical signals that interrupt pain signals (transcutaneous electrical nerve stimulation, TENS). Weak lasers that reduce pain and swelling (low-level laser therapy). Signals from your body that help you learn to regulate pain (biofeedback). Occupational therapy. This helps you to learn ways to function at home and work with  less pain. Recreational therapy. This involves trying new activities or hobbies, such as a physical activity or drawing. Mental health therapy, including: Cognitive behavioral therapy (CBT). This helps you learn coping skills for dealing with pain. Acceptance and commitment therapy (ACT) to change the way you think and react to pain. Relaxation therapies, including muscle relaxation exercises and mindfulness-based stress reduction. Pain management counseling. This may be individual, family, or group counseling.  Receive medical treatments Medical treatments for pain management include: Nerve block injections. These may include a pain blocker and anti-inflammatory medicines. You may have injections: Near the spine to relieve chronic back or neck pain. Into joints to relieve back or joint pain. Into nerve areas that supply a painful area to relieve body pain. Into muscles (trigger point injections) to relieve some painful muscle conditions. A medical device placed near your spine to help block pain signals and relieve nerve pain or chronic back pain (spinal cord stimulation device). Acupuncture. Follow these instructions at home Medicines Take over-the-counter and prescription medicines only as told by your health care provider. If you are taking pain medicine, ask your health care providers about possible side effects to watch out for. Do not drive or use heavy machinery while taking prescription opioid pain medicine. Lifestyle  Do not use drugs or alcohol  to reduce pain. If you drink alcohol , limit how much you  have to: 0-1 drink a day for women who are not pregnant. 0-2 drinks a day for men. Know how much alcohol  is in a drink. In the [place] one drink equals one 12 oz bottle of beer (355 mL), one 5 oz glass of wine (148 mL), or one 1 oz glass of hard liquor (44 mL). Do not use any products that contain nicotine or tobacco. These products include cigarettes, chewing tobacco, and vaping  devices, such as e-cigarettes. If you need help quitting, ask your health care provider. Eat a healthy diet and maintain a healthy weight. Poor diet and excess weight may make pain worse. Eat foods that are high in fiber. These include fresh fruits and vegetables, whole grains, and beans. Limit foods that are high in fat and processed sugars, such as fried and sweet foods. Exercise regularly. Exercise lowers stress and may help relieve pain. Ask your health care provider what activities and exercises are safe for you. If your health care provider approves, join an exercise class that combines movement and stress reduction. Examples include yoga and tai chi. Get enough sleep. Lack of sleep may make pain worse. Lower stress as much as possible. Practice stress reduction techniques as told by your therapist. General instructions Work with all your pain management providers to find the treatments that work best for you. You are an important member of your pain management team. There are many things you can do to reduce pain on your own. Consider joining an online or in-person support group for people who have chronic pain. Keep all follow-up visits. This is important. Where to find more information You can find more information about managing pain without opioids from: American Academy of Pain Medicine: painmed.org Institute for Chronic Pain: instituteforchronicpain.org American Chronic Pain Association: theacpa.org Contact a health care provider if: You have side effects from pain medicine. Your pain gets worse or does not get better with treatments or home therapy. You are struggling with anxiety or depression. Summary Many types of pain can be managed without opioids. Chronic pain may respond better to pain management without opioids. Pain is best managed when you and a team of health care providers work together. Pain management without opioids may include non-opioid medicines, medical  treatments, physical therapy, mental health therapy, and lifestyle changes. Tell your health care providers if your pain gets worse or is not being managed well enough. This information is not intended to replace advice given to you by your health care provider. Make sure you discuss any questions you have with your health care provider. Document Revised: 09/21/2020 Document Reviewed: 09/21/2020 Elsevier Patient Education  2024 [employer].

## 2024-03-06 ENCOUNTER — Ambulatory Visit (INDEPENDENT_AMBULATORY_CARE_PROVIDER_SITE_OTHER): Admitting: Podiatry

## 2024-03-06 ENCOUNTER — Encounter: Payer: Self-pay | Admitting: Podiatry

## 2024-03-06 ENCOUNTER — Telehealth: Payer: Self-pay

## 2024-03-06 ENCOUNTER — Encounter: Payer: Self-pay | Admitting: Pharmacist

## 2024-03-06 DIAGNOSIS — M79675 Pain in left toe(s): Secondary | ICD-10-CM

## 2024-03-06 DIAGNOSIS — B351 Tinea unguium: Secondary | ICD-10-CM

## 2024-03-06 DIAGNOSIS — N1832 Chronic kidney disease, stage 3b: Secondary | ICD-10-CM

## 2024-03-06 DIAGNOSIS — E1122 Type 2 diabetes mellitus with diabetic chronic kidney disease: Secondary | ICD-10-CM

## 2024-03-06 DIAGNOSIS — M79674 Pain in right toe(s): Secondary | ICD-10-CM

## 2024-03-06 NOTE — Progress Notes (Signed)
   03/06/2024 Name: Kayla Brown MRN: [id number] DOB: 1950-11-09  Chief Complaint  Patient presents with   Medication Adherence    Ozempic     Kayla Brown is a 73 y.o. year old female. Coordinated with [employer] medication assistance program.    They were referred to the pharmacist by their PCP for assistance in managing diabetes and medication access.    Subjective:  Medication Access/Adherence  Current Pharmacy:  [id number] [address] [place], [place] - [address] [place] [address] Phone: [phone] Fax: [phone]   Patient reports affordability concerns with their medications: Yes  - screened fro LIS / Medicare Extra Help at November 2024 appointment - pt did not qualify. Patient reports access/transportation concerns to their pharmacy: No  Patient reports adherence concerns with their medications:  No     Patient has been approved for [employer] patient assistance program to receive Ozempic  2mg  thru 06/24/2024. It looks like there was a reorder form faxed to [employer] patient assistance program 02/13/2024 but no shipment has been received since 11/21/2023.   Diabetes: Current medications:  Lantus  10 units daily, Farxiga  10mg  daily, glipizide  10mg  twice a day, Ozempic  2mg  weekly  Medications tried in the past: Tradjenta  - stopped when changed to Ozempic    Objective:  Lab Results  Component Value Date   HGBA1C 8.4 (H) 12/13/2023    Lab Results  Component Value Date   CREATININE 1.57 (H) 12/13/2023   BUN 38 (H) 12/13/2023   NA 139 12/13/2023   K 4.2 12/13/2023   CL 102 12/13/2023   CO2 27 12/13/2023    Lab Results  Component Value Date   CHOL 141 12/13/2023   HDL 55.60 12/13/2023   LDLCALC 63 12/13/2023   LDLDIRECT 50.0 05/23/2020   TRIG 112.0 12/13/2023   CHOLHDL 3 12/13/2023      Assessment/Plan:   Medication Access:  - contacted [employer] - they did receive order for Ozempic  02/13/2024 but  quantity for Ozempic  was only 3 boxes and they require 4 boxes for each reorder.  - Updated reorder and faxed to [employer] - LM on VM of patient regarding Ozempic  to make sure she has enough to last until shipment received. I anticipate that order will be received in about 2 to 4 weeks.   Follow up planned in October / November to start 2026 patient assistance program for Ozempic   Madelin Ray, PharmD Clinical Pharmacist [facility]

## 2024-03-06 NOTE — Telephone Encounter (Signed)
 Patient calling to let you know she only have 5 doses of Ozempic  left.    Copied from CRM #[id number]. Topic: Clinical - Medication Question >> Mar 06, 2024 12:26 PM Chasity T wrote: Reason for CRM: Patient is calling to let tammy know that she only has 5 doses of ozempic  2 mg left after monday

## 2024-03-06 NOTE — Progress Notes (Signed)
 This patient returns to my office for at risk foot care.  This patient requires this care by a professional since this patient will be at risk due to having diabetes mellitus.  This patient is unable to cut nails herself since the patient cannot reach her nails.These nails are painful walking and wearing shoes.  This patient presents for at risk foot care today.  General Appearance  Alert, conversant and in no acute stress.  Vascular  Dorsalis pedis and posterior tibial  pulses are palpable  bilaterally.  Capillary return is within normal limits  bilaterally. Temperature is within normal limits  bilaterally.  Neurologic  Senn-Weinstein monofilament wire test within normal limits  bilaterally. Muscle power within normal limits bilaterally.  Nails Thick disfigured discolored nails with subungual debris  from hallux to fifth toes bilaterally. No evidence of bacterial infection or drainage bilaterally.  Orthopedic  No limitations of motion  feet .  No crepitus or effusions noted.  No bony pathology or digital deformities noted.  HAV  B/L.  Hammer toes  B/L.  Skin  normotropic skin with no porokeratosis noted bilaterally.  No signs of infections or ulcers noted.     Onychomycosis  Pain in right toes  Pain in left toes  Consent was obtained for treatment procedures.   Mechanical debridement of nails 1-5  bilaterally performed with a nail nipper.  Filed with dremel without incident.    Return office visit   3   mos.                  Told patient to return for periodic foot care and evaluation due to potential at risk complications.   Helane Gunther DPM

## 2024-03-06 NOTE — Telephone Encounter (Signed)
 I have refaxed reorder forms to [employer] with correct quantify. I expect we should receive delivery in 2 to 4 weeks, so patient should have enough to last until received.

## 2024-03-12 ENCOUNTER — Ambulatory Visit: Admitting: Family Medicine

## 2024-03-12 ENCOUNTER — Encounter: Payer: Self-pay | Admitting: Family Medicine

## 2024-03-12 ENCOUNTER — Ambulatory Visit (HOSPITAL_BASED_OUTPATIENT_CLINIC_OR_DEPARTMENT_OTHER)
Admission: RE | Admit: 2024-03-12 | Discharge: 2024-03-12 | Disposition: A | Discharge status: Home / Self Care | Source: Ambulatory Visit | Attending: Family Medicine | Admitting: Family Medicine

## 2024-03-12 VITALS — BP 110/60 | HR 64 | Temp 98.0°F | Ht <= 58 in | Wt 193.1 lb

## 2024-03-12 DIAGNOSIS — Z23 Encounter for immunization: Secondary | ICD-10-CM

## 2024-03-12 DIAGNOSIS — R159 Full incontinence of feces: Secondary | ICD-10-CM | POA: Insufficient documentation

## 2024-03-12 DIAGNOSIS — Z794 Long term (current) use of insulin: Secondary | ICD-10-CM

## 2024-03-12 DIAGNOSIS — E1165 Type 2 diabetes mellitus with hyperglycemia: Secondary | ICD-10-CM | POA: Diagnosis not present

## 2024-03-12 DIAGNOSIS — R109 Unspecified abdominal pain: Secondary | ICD-10-CM | POA: Diagnosis not present

## 2024-03-12 LAB — BASIC METABOLIC PANEL WITH GFR
BUN: 25 mg/dL — ABNORMAL HIGH (ref 6–23)
CO2: 29 meq/L (ref 19–32)
Calcium: 11 mg/dL — ABNORMAL HIGH (ref 8.4–10.5)
Chloride: 99 meq/L (ref 96–112)
Creatinine, Ser: 1.5 mg/dL — ABNORMAL HIGH (ref 0.40–1.20)
GFR: 34.73 mL/min — ABNORMAL LOW (ref >60.00)
Glucose, Bld: 186 mg/dL — ABNORMAL HIGH (ref 70–99)
Potassium: 4.1 meq/L (ref 3.5–5.1)
Sodium: 136 meq/L (ref 135–145)

## 2024-03-12 LAB — HEMOGLOBIN A1C: Hgb A1c MFr Bld: 9.1 % — ABNORMAL HIGH (ref 4.6–6.5)

## 2024-03-12 NOTE — Patient Instructions (Signed)
 Follow up in 3-4 months to recheck sugar, blood pressure, and cholesterol We'll notify you of your lab results and make any changes if needed GO to [facility] for your xray Try and give yourself some grace and allow yourself to settle into your new routine Call with any questions or concerns Stay Safe!  Stay Healthy! Hang in there!!

## 2024-03-12 NOTE — Assessment & Plan Note (Signed)
 Chronic problem.  Currently on Farxiga , Glipizide , Glargine, and Semaglutide .  Foot exam done today.  UTD on eye exam and microalbumin.  Sugars have been variable due to her recent stress level over moving.  Currently asymptomatic.  Check labs.  Adjust meds prn

## 2024-03-12 NOTE — Progress Notes (Signed)
   Subjective:    Patient ID: Kayla Brown, female    DOB: 1951/06/10, 73 y.o.   MRN: [id number]  HPI DM- chronic problem, on Farxiga  10mg  daily, Glipizide  10mg  BID, Glargine 10 units daily, and Semaglutide  2mg  weekly.  UTD on eye exam, micoalbumin.  Due for foot exam.  Pt reports sugars have been labile due to stress of recent move.  No CP, SOB, HA's, visual changes.  No numbness/tingling of hands/feet.  Bowel incontinence- pt reports Kayla Brown will have the urge to go and Kayla Brown is unable to control it.  Has accidents episodically.  Pt feels that Kayla Brown is constipated leading up to these episodes.  Husband reports strong odor and greenish color.  Pt reports sxs started ~1 yr ago.      Review of Systems For ROS see HPI     Objective:   Physical Exam Vitals reviewed.  Constitutional:      General: Kayla Brown is not in acute distress.    Appearance: Normal appearance. Kayla Brown is well-developed. Kayla Brown is not ill-appearing.  HENT:     Head: Normocephalic and atraumatic.  Eyes:     Conjunctiva/sclera: Conjunctivae normal.     Pupils: Pupils are equal, round, and reactive to light.  Neck:     Thyroid : No thyromegaly.  Cardiovascular:     Rate and Rhythm: Normal rate and regular rhythm.     Pulses: Normal pulses.     Heart sounds: Normal heart sounds. No murmur heard. Pulmonary:     Effort: Pulmonary effort is normal. No respiratory distress.     Breath sounds: Normal breath sounds.  Abdominal:     General: There is no distension.     Palpations: Abdomen is soft.     Tenderness: There is no abdominal tenderness. There is no guarding or rebound.  Musculoskeletal:     Cervical back: Normal range of motion and neck supple.     Right lower leg: No edema.     Left lower leg: No edema.  Lymphadenopathy:     Cervical: No cervical adenopathy.  Skin:    General: Skin is warm and dry.  Neurological:     Mental Status: Kayla Brown is alert and oriented to person, place, and time. Mental status is at baseline.   Psychiatric:        Mood and Affect: Mood normal.        Behavior: Behavior normal.        Thought Content: Thought content normal.           Assessment & Plan:  Bowel incontinence- new to provider, ongoing for pt.  Apparently this has been going on- episodically- for over a year.  Pt reports Kayla Brown will typically feel constipated before having one of these episodes, which makes me wonder if Kayla Brown is stooling around some sort of blockage.  Will get KUB and decide how to proceed.  Pt expressed understanding and is in agreement w/ plan.

## 2024-03-12 NOTE — Telephone Encounter (Signed)
 Pt's Ozempic  x4 pens has been delivered - I called pt at 10:22am to let her know it was here when she could swing by to pick it up Pt stated she would most likely come by Friday 9/19

## 2024-03-13 ENCOUNTER — Encounter: Payer: Self-pay | Admitting: Family Medicine

## 2024-03-13 ENCOUNTER — Ambulatory Visit: Payer: Self-pay | Admitting: Family Medicine

## 2024-03-13 NOTE — Progress Notes (Signed)
 Patient read mychart message from Dr. Mahlon

## 2024-03-14 ENCOUNTER — Other Ambulatory Visit: Payer: Self-pay | Admitting: Cardiology

## 2024-03-16 NOTE — Progress Notes (Signed)
 Patient reviewed labs and message from Dr. Mahlon

## 2024-03-16 NOTE — Telephone Encounter (Signed)
Please see previous notes in chart

## 2024-03-16 NOTE — Telephone Encounter (Signed)
 Patient is responding to lab results from 9/19 and states she is already on the 15 units that was recommended in lab results notes and is wondering if she is needing to be changed to a higher dosage since her A1C was elevated?

## 2024-03-17 ENCOUNTER — Telehealth: Payer: Self-pay | Admitting: Pharmacist

## 2024-03-17 NOTE — Telephone Encounter (Signed)
 Opened in error

## 2024-03-31 ENCOUNTER — Other Ambulatory Visit: Payer: Self-pay | Admitting: Family Medicine

## 2024-03-31 DIAGNOSIS — E1165 Type 2 diabetes mellitus with hyperglycemia: Secondary | ICD-10-CM

## 2024-04-14 ENCOUNTER — Other Ambulatory Visit: Payer: Self-pay | Admitting: Pharmacist

## 2024-04-14 NOTE — Patient Instructions (Signed)
 Hi Mrs. Con. Below is information about Ozempic  medication assistance program that we discussed.   I wanted to let you know about an important upcoming change that may impact one of your prescribed medications. [employer], the maker of Ozempic  and Rybelsus , has announced that they will no longer offer these medications to Medicare beneficiaries through their Patient Assistance Program in 2026.   This means that if you currently receive Ozempic  or Rybelsus  at no cost through the program, starting in January 2026 you'll need to use your insurance to continue on these medicines.   Choosing a Medicare Plan  With Medicare's Annual Enrollment Period starting on October 15th, we encourage you to review formulary coverage and copay amounts for Ozempic  or Rybelsus , as costs can vary widely between plans. Starting on Wednesday, October 1st you can compare your Medicare plan options by going to the Plan Finder tool at [employer].gov ( TeleconferenceOnDemand.fr ).   There are Medicare Specialists through the [facility] [facility] Insurance Information Program ([place]) that can help you shop for Medicare plans.    SHIIP toll free number: [phone] (Monday - Friday 8 am - 5 pm)  There are representatives located in each county:   [employer] [facility] [place] [address]     [place]  [address] [phone] ext. [phone]    Call for an appointment     Maximum Out-of-Pocket and Prescription Payment Plan  In 2026, the maximum yearly out-of-pocket cost for medications is $2,100 for all Medicare beneficiaries. This means you will not have to pay more than $2,100 in total for all prescription medications filled on your Medicare plan in 2026. You may also choose to enroll in a Medicare Prescription Payment Plan through your chosen 2026 Medicare plan. This lets you spread out your prescription drug costs over the year instead of laying a large amount all at  once at the pharmacy.   Medicare Extra Help  Some patients may qualify for a program called Medicare Extra Help that could help lower the cost of prescriptions on your chosen Medicare plan. If you meet the below income criteria, please visit [email or web address] to apply, or respond to this message and one of our pharmacy team members can call you to help apply.    Your Household Size Annual income limit Resource Limit*  Individual [id number] $17,600  Married Couple [phone] $35,130   *Resources include:   -  Money in a checking, savings, or retirement account   -  Stocks   -  Bonds  Medicare Does NOT consider the following in your resource limit:   -  Your home   -  One car   -  Burial plot   -  Up to $1,500 for burial expenses if you have put that money aside   -  Furniture    Madelin Ray, PharmD Designer, [facility] [employer] [phone]

## 2024-04-14 NOTE — Progress Notes (Signed)
 04/14/2024 Name: Kayla Brown MRN: [id number] DOB: June 13, 1951  Chief Complaint  Patient presents with   Medication Management   Diabetes    Kayla Brown is a 73 y.o. year old female. Patient presented for a phone visit today.   They were referred to the pharmacist by their PCP for assistance in managing diabetes and medication access.    Subjective:  Medication Access/Adherence  Current Pharmacy:  [id number] [address] [place], [place] - [address] [place] [address] Phone: [phone] Fax: [phone]   Patient reports affordability concerns with their medications: Yes  - screened for LIS / Medicare Extra Help at November 2024 appointment - pt did not qualify. Patient reports access/transportation concerns to their pharmacy: No  Patient reports adherence concerns with their medications:  No     Patient has been approved for [employer] patient assistance program to receive Ozempic  2mg  thru 06/24/2024. Last received shipment around 03/06/2024 for 4 boxes.   Diabetes: Current medications:  Lantus  10 units daily, Farxiga  10mg  daily, glipizide  10mg  twice a day, Ozempic  2mg  weekly  Medications tried in the past: Tradjenta  - stopped when changed to Ozempic   Patient denies hypoglycemic s/sx including no dizziness, shakiness, sweating. Patient denies hyperglycemic symptoms including no polyuria, polydipsia, polyphagia, nocturia, neuropathy, blurred vision.   Macrovascular and Microvascular Risk Reduction:  Statin? yes (atorvastatin  ); ACEi/ARB? No - Previously on RAASi therapy with spironolactone  and losartan but was stopped after developing AKI. Last urinary albumin /creatinine ratio:  Lab Results  Component Value Date   MICRALBCREAT Unable to calculate 09/12/2023   MICRALBCREAT 40.0 07/23/2023   MICRALBCREAT 10 01/03/2023   Last eye exam:  Lab Results  Component Value Date   HMDIABEYEEXA No Retinopathy 08/26/2023   Last foot  exam: 03/07/2023 Tobacco Use:  Tobacco Use: Low Risk  (03/12/2024)   Patient History    Smoking Tobacco Use: Never    Smokeless Tobacco Use: Never    Passive Exposure: Not on file      Objective:  Lab Results  Component Value Date   HGBA1C 9.1 (H) 03/12/2024    Lab Results  Component Value Date   CREATININE 1.50 (H) 03/12/2024   BUN 25 (H) 03/12/2024   NA 136 03/12/2024   K 4.1 03/12/2024   CL 99 03/12/2024   CO2 29 03/12/2024    Lab Results  Component Value Date   CHOL 141 12/13/2023   HDL 55.60 12/13/2023   LDLCALC 63 12/13/2023   LDLDIRECT 50.0 05/23/2020   TRIG 112.0 12/13/2023   CHOLHDL 3 12/13/2023      Assessment/Plan:   Medication Access:  - Discussed that she would not qualify to receive Ozempic  from [employer] patient assistance program for 2026.  - Reviewed her 2026 [employer] plan - has [phone] deductible and coinsurance for Ozempic  would be 18% of med cost (tier 3 - preferred brand) which is about $250/month.  - Discussed possible options:   1) keep Silver Script and consider monthly Rx payment plan - about $175 / month  2) try to get new Part D plan with lower Ozempic  cost. Provided patient with number to [facility] to call to make appointment to discuss Part D alternatives  3) change to insulin +GLP1 combo like Xultophy or Soliqua - some plans cover combo at $35 / month or could get from [place] from [employer] patient assistance program or Soliqua from [employer] patient assistance program.    Follow up planned in December - to discuss med cost  and possibly reorder Ozempic  is [employer] allows.   Madelin Ray, PharmD Clinical Pharmacist [facility]

## 2024-05-14 DIAGNOSIS — E785 Hyperlipidemia, unspecified: Secondary | ICD-10-CM | POA: Diagnosis not present

## 2024-05-14 DIAGNOSIS — N2581 Secondary hyperparathyroidism of renal origin: Secondary | ICD-10-CM | POA: Diagnosis not present

## 2024-05-14 DIAGNOSIS — I5032 Chronic diastolic (congestive) heart failure: Secondary | ICD-10-CM | POA: Diagnosis not present

## 2024-05-14 DIAGNOSIS — N1831 Chronic kidney disease, stage 3a: Secondary | ICD-10-CM | POA: Diagnosis not present

## 2024-05-14 DIAGNOSIS — E1122 Type 2 diabetes mellitus with diabetic chronic kidney disease: Secondary | ICD-10-CM | POA: Diagnosis not present

## 2024-05-14 DIAGNOSIS — D631 Anemia in chronic kidney disease: Secondary | ICD-10-CM | POA: Diagnosis not present

## 2024-05-15 DIAGNOSIS — N1831 Chronic kidney disease, stage 3a: Secondary | ICD-10-CM | POA: Diagnosis not present

## 2024-05-15 LAB — LAB REPORT - SCANNED
Albumin, Urine POC: 37.2
Albumin/Creatinine Ratio, Urine, POC: 18
Creatinine, POC: 204.6 mg/dL
EGFR: 33

## 2024-05-16 ENCOUNTER — Other Ambulatory Visit: Payer: Self-pay | Admitting: Family Medicine

## 2024-05-16 DIAGNOSIS — F32A Depression, unspecified: Secondary | ICD-10-CM

## 2024-05-17 ENCOUNTER — Other Ambulatory Visit: Payer: Self-pay | Admitting: Family Medicine

## 2024-05-26 ENCOUNTER — Other Ambulatory Visit: Payer: Self-pay | Admitting: Pharmacist

## 2024-05-26 ENCOUNTER — Telehealth: Payer: Self-pay | Admitting: Pharmacist

## 2024-05-26 NOTE — Telephone Encounter (Unsigned)
 Copied from CRM [id number]. Topic: General - Other >> May 26, 2024  2:29 PM Kayla Brown wrote: Reason for CRM: Patient called in to leave a message with nurse that her and Kayla Brown have decided to keep drug plan for next yr and receive everything the same.

## 2024-05-26 NOTE — Telephone Encounter (Signed)
 Attempt was made to contact patient by phone today for follow up by Clinical Pharmacist regarding medication access and discuss 2026 medication assistance program / Medicare plans.  Unable to reach patient. LM on VM with my contact number [phone].

## 2024-06-05 ENCOUNTER — Ambulatory Visit: Admitting: Podiatry

## 2024-06-05 ENCOUNTER — Encounter: Payer: Self-pay | Admitting: Podiatry

## 2024-06-05 DIAGNOSIS — E1122 Type 2 diabetes mellitus with diabetic chronic kidney disease: Secondary | ICD-10-CM

## 2024-06-05 DIAGNOSIS — B351 Tinea unguium: Secondary | ICD-10-CM

## 2024-06-05 DIAGNOSIS — M79674 Pain in right toe(s): Secondary | ICD-10-CM | POA: Diagnosis not present

## 2024-06-05 DIAGNOSIS — N1832 Chronic kidney disease, stage 3b: Secondary | ICD-10-CM

## 2024-06-05 DIAGNOSIS — M79675 Pain in left toe(s): Secondary | ICD-10-CM | POA: Diagnosis not present

## 2024-06-05 NOTE — Progress Notes (Signed)
 This patient returns to my office for at risk foot care.  This patient requires this care by a professional since this patient will be at risk due to having diabetes mellitus.  This patient is unable to cut nails herself since the patient cannot reach her nails.These nails are painful walking and wearing shoes.  This patient presents for at risk foot care today.  General Appearance  Alert, conversant and in no acute stress.  Vascular  Dorsalis pedis and posterior tibial  pulses are palpable  bilaterally.  Capillary return is within normal limits  bilaterally. Temperature is within normal limits  bilaterally.  Neurologic  Senn-Weinstein monofilament wire test within normal limits  bilaterally. Muscle power within normal limits bilaterally.  Nails Thick disfigured discolored nails with subungual debris  from hallux to fifth toes bilaterally. No evidence of bacterial infection or drainage bilaterally.  Orthopedic  No limitations of motion  feet .  No crepitus or effusions noted.  No bony pathology or digital deformities noted.  HAV  B/L.  Hammer toes  B/L.  Skin  normotropic skin with no porokeratosis noted bilaterally.  No signs of infections or ulcers noted.     Onychomycosis  Pain in right toes  Pain in left toes  Consent was obtained for treatment procedures.   Mechanical debridement of nails 1-5  bilaterally performed with a nail nipper.  Filed with dremel without incident.    Return office visit   3   mos.                  Told patient to return for periodic foot care and evaluation due to potential at risk complications.   Helane Gunther DPM

## 2024-06-10 ENCOUNTER — Other Ambulatory Visit: Payer: Self-pay | Admitting: Family Medicine

## 2024-06-18 ENCOUNTER — Other Ambulatory Visit: Payer: Self-pay | Admitting: Family Medicine

## 2024-07-01 ENCOUNTER — Encounter: Payer: Self-pay | Admitting: Internal Medicine

## 2024-07-12 ENCOUNTER — Other Ambulatory Visit: Payer: Self-pay | Admitting: Family Medicine

## 2024-07-14 ENCOUNTER — Encounter: Payer: Self-pay | Admitting: Family Medicine

## 2024-07-14 ENCOUNTER — Ambulatory Visit: Admitting: Family Medicine

## 2024-07-14 VITALS — BP 100/48 | HR 66 | Temp 98.3°F | Resp 16 | Ht <= 58 in | Wt 193.2 lb

## 2024-07-14 DIAGNOSIS — E1122 Type 2 diabetes mellitus with diabetic chronic kidney disease: Secondary | ICD-10-CM | POA: Diagnosis not present

## 2024-07-14 DIAGNOSIS — E785 Hyperlipidemia, unspecified: Secondary | ICD-10-CM | POA: Diagnosis not present

## 2024-07-14 DIAGNOSIS — R159 Full incontinence of feces: Secondary | ICD-10-CM

## 2024-07-14 DIAGNOSIS — Z7984 Long term (current) use of oral hypoglycemic drugs: Secondary | ICD-10-CM

## 2024-07-14 DIAGNOSIS — I129 Hypertensive chronic kidney disease with stage 1 through stage 4 chronic kidney disease, or unspecified chronic kidney disease: Secondary | ICD-10-CM | POA: Diagnosis not present

## 2024-07-14 DIAGNOSIS — I10 Essential (primary) hypertension: Secondary | ICD-10-CM

## 2024-07-14 DIAGNOSIS — Z7985 Long-term (current) use of injectable non-insulin antidiabetic drugs: Secondary | ICD-10-CM

## 2024-07-14 DIAGNOSIS — N1832 Chronic kidney disease, stage 3b: Secondary | ICD-10-CM

## 2024-07-14 DIAGNOSIS — Z6841 Body Mass Index (BMI) 40.0 and over, adult: Secondary | ICD-10-CM

## 2024-07-14 DIAGNOSIS — Z794 Long term (current) use of insulin: Secondary | ICD-10-CM

## 2024-07-14 LAB — CBC WITH DIFFERENTIAL/PLATELET
Basophils Absolute: 0 K/uL (ref 0.0–0.1)
Basophils Relative: 0.4 % (ref 0.0–3.0)
Eosinophils Absolute: 0.2 K/uL (ref 0.0–0.7)
Eosinophils Relative: 2.2 % (ref 0.0–5.0)
HCT: 43.4 % (ref 36.0–46.0)
Hemoglobin: 14.4 g/dL (ref 12.0–15.0)
Lymphocytes Relative: 18.6 % (ref 12.0–46.0)
Lymphs Abs: 1.8 K/uL (ref 0.7–4.0)
MCHC: 33.3 g/dL (ref 30.0–36.0)
MCV: 93.4 fl (ref 78.0–100.0)
Monocytes Absolute: 1 K/uL (ref 0.1–1.0)
Monocytes Relative: 10.2 % (ref 3.0–12.0)
Neutro Abs: 6.6 K/uL (ref 1.4–7.7)
Neutrophils Relative %: 68.6 % (ref 43.0–77.0)
Platelets: 253 K/uL (ref 150.0–400.0)
RBC: 4.64 Mil/uL (ref 3.87–5.11)
RDW: 14.2 % (ref 11.5–15.5)
WBC: 9.6 K/uL (ref 4.0–10.5)

## 2024-07-14 LAB — BASIC METABOLIC PANEL WITH GFR
BUN: 35 mg/dL — ABNORMAL HIGH (ref 6–23)
CO2: 28 meq/L (ref 19–32)
Calcium: 10.1 mg/dL (ref 8.4–10.5)
Chloride: 105 meq/L (ref 96–112)
Creatinine, Ser: 1.66 mg/dL — ABNORMAL HIGH (ref 0.40–1.20)
GFR: 30.68 mL/min — ABNORMAL LOW
Glucose, Bld: 252 mg/dL — ABNORMAL HIGH (ref 70–99)
Potassium: 4.5 meq/L (ref 3.5–5.1)
Sodium: 139 meq/L (ref 135–145)

## 2024-07-14 LAB — LIPID PANEL
Cholesterol: 139 mg/dL (ref 28–200)
HDL: 52.9 mg/dL
LDL Cholesterol: 54 mg/dL (ref 10–99)
NonHDL: 86.15
Total CHOL/HDL Ratio: 3
Triglycerides: 162 mg/dL — ABNORMAL HIGH (ref 10.0–149.0)
VLDL: 32.4 mg/dL (ref 0.0–40.0)

## 2024-07-14 LAB — HEPATIC FUNCTION PANEL
ALT: 28 U/L (ref 3–35)
AST: 20 U/L (ref 5–37)
Albumin: 4 g/dL (ref 3.5–5.2)
Alkaline Phosphatase: 79 U/L (ref 39–117)
Bilirubin, Direct: 0.1 mg/dL (ref 0.1–0.3)
Total Bilirubin: 0.4 mg/dL (ref 0.2–1.2)
Total Protein: 6.7 g/dL (ref 6.0–8.3)

## 2024-07-14 LAB — TSH: TSH: 1.64 u[IU]/mL (ref 0.35–5.50)

## 2024-07-14 LAB — HEMOGLOBIN A1C: Hgb A1c MFr Bld: 7.9 % — ABNORMAL HIGH (ref 4.6–6.5)

## 2024-07-14 MED ORDER — TRAMADOL HCL 50 MG PO TABS: 50.0000 mg | ORAL_TABLET | Freq: Four times a day (QID) | ORAL | 0 refills | Status: AC | PRN

## 2024-07-14 MED ORDER — INSULIN GLARGINE 100 UNIT/ML SOLOSTAR PEN: 20.0000 [IU] | PEN_INJECTOR | Freq: Every day | SUBCUTANEOUS | 3 refills | Status: AC

## 2024-07-14 NOTE — Progress Notes (Signed)
" ° °  Subjective:    Patient ID: Kayla Brown, female    DOB: 06-Apr-1951, 74 y.o.   MRN: [id number]  HPI DM- chronic problem, on Farxiga  10mg  daily, Glipizide  10mg  BID, Basaglar  10 units daily, Semaglutide  2mg  weekly.  No abd pain w/ exception of cramping w/ BM's.  No N/V.  No numbness/tingling of hands/feet.  Needs to switch back to Lantus  due to formulary changes.  HTN- chronic problem, on Amlodipine  10mg  daily, Coreg  25mg  BID w/ good control.  No CP, SOB unless anxious, HA's, visual changes, edema.  Hyperlipidemia- chronic problem, on Lipitor 40mg  daily  Obesity- BMI 43.31  Fecal incontinence- pt and husband did some research and feel that she may have a component of dumping syndrome.  Pt plans to start daily pre/probiotic.   Review of Systems For ROS see HPI     Objective:   Physical Exam Vitals reviewed.  Constitutional:      General: She is not in acute distress.    Appearance: Normal appearance. She is well-developed. She is obese. She is not ill-appearing.  HENT:     Head: Normocephalic and atraumatic.  Eyes:     Conjunctiva/sclera: Conjunctivae normal.     Pupils: Pupils are equal, round, and reactive to light.  Neck:     Thyroid : No thyromegaly.  Cardiovascular:     Rate and Rhythm: Normal rate and regular rhythm.     Pulses: Normal pulses.     Heart sounds: Normal heart sounds. No murmur heard. Pulmonary:     Effort: Pulmonary effort is normal. No respiratory distress.     Breath sounds: Normal breath sounds.  Abdominal:     General: There is no distension.     Palpations: Abdomen is soft.     Tenderness: There is no abdominal tenderness.  Musculoskeletal:     Cervical back: Normal range of motion and neck supple.     Right lower leg: Edema (trace) present.     Left lower leg: Edema (trace) present.  Lymphadenopathy:     Cervical: No cervical adenopathy.  Skin:    General: Skin is warm and dry.  Neurological:     General: No focal deficit present.      Mental Status: She is alert and oriented to person, place, and time.  Psychiatric:        Mood and Affect: Mood normal.        Behavior: Behavior normal.        Thought Content: Thought content normal.           Assessment & Plan:  Fecal incontinence- ongoing issue for pt.  She plans to start pre/probiotics daily.  The fact that she has to go w/o warning and has very little time to make it to the bathroom has worsened her anxiety.  Strongly encouraged GI referral- she reluctantly agreed.  "

## 2024-07-14 NOTE — Assessment & Plan Note (Signed)
 Chronic problem.  On Amlodipine  10mg  daily and Coreg  25mg  BID.  BP is over controlled today.  Will decrease Amlodipine  to 5mg  daily and monitor.  Pt expressed understanding and is in agreement w/ plan.

## 2024-07-14 NOTE — Patient Instructions (Addendum)
 Follow up in 3-4 months to recheck sugar We'll notify you of your lab results and make any changes if needed DECREASE the Amlodipine  to 1/2 tab daily (5mg ) I sent a new prescription for the Lantus  pen Call Tammy and get re-certified for Ozempic  patient assistance Continue to work on healthy diet and regular physical activity We'll call you to schedule your GI appt Call with any questions or concerns Stay Safe!  Stay Healthy! Hang in there!!!

## 2024-07-14 NOTE — Assessment & Plan Note (Signed)
 Chronic problem.  Currently on Farxiga , Glipizide , Basaglar , and Semaglutide .  We need to change the Basaglar  back to Lantus  due to formulary changes.  She is also considering weaning/stopping Ozempic .  Told her to wait and see what labs show and then we can decide about the dose going forward.  UTD on eye exam, foot exam, microalbumin.

## 2024-07-14 NOTE — Assessment & Plan Note (Signed)
 Chronic problem.  On Lipitor 40mg  daily w/o difficulty.  Check labs.  Adjust meds prn  ?

## 2024-07-14 NOTE — Assessment & Plan Note (Signed)
 Ongoing issue.  BMI now 43.31  Encouraged low carb diet and physical activity as able.

## 2024-07-15 ENCOUNTER — Ambulatory Visit: Payer: Self-pay | Admitting: Family Medicine

## 2024-07-15 ENCOUNTER — Telehealth: Payer: Self-pay

## 2024-07-15 DIAGNOSIS — R7989 Other specified abnormal findings of blood chemistry: Secondary | ICD-10-CM

## 2024-07-15 NOTE — Telephone Encounter (Signed)
 Patient requesting you to call her  Copied from CRM #[id number]. Topic: General - Other >> Jul 15, 2024  9:47 AM Macario HERO wrote: Reason for CRM: Patient called requesting a call from [facility].

## 2024-07-15 NOTE — Progress Notes (Signed)
 Patient scheduled lab visit

## 2024-07-15 NOTE — Progress Notes (Signed)
 Called patient to schedule lab only visit in 2-3 weeks. Lvmtrc. If patient calls back, please schedule  Future lab ordered  Labs sent to [facility]

## 2024-07-17 NOTE — Telephone Encounter (Signed)
 Sounds perfect  Thank you

## 2024-07-17 NOTE — Telephone Encounter (Signed)
 Patient states she has not been able to afford Ozempic . Checked her 2026 [employer] benefits. Her deductible is $615. Ozempic  is tier 5 which will be 25% copay or about $245. Patient would like consider alternative. [employer] has patient assistance program that she would likely qualify. Will check with her PCP about changing from Ozempic  2mg  weekly to Trulicity - I would recommend starting with Trulicity 3mg  weekly for the first 1 to 2 months and then increase to 4.5mg  weekly if tolerated. Patient would like delivery to our office instead of to her home / apartment if able.

## 2024-07-17 NOTE — Addendum Note (Signed)
 Addended by: CARLA MILLING B on: 07/17/2024 12:51 PM   Modules accepted: Orders

## 2024-07-23 ENCOUNTER — Other Ambulatory Visit (HOSPITAL_COMMUNITY): Payer: Self-pay

## 2024-07-23 ENCOUNTER — Telehealth: Payer: Self-pay

## 2024-07-23 NOTE — Telephone Encounter (Signed)
 PAP: Patient assistance application for [employer] through [employer] has been mailed to pt's home address on file. Provider portion of application faxed to provider's office.

## 2024-07-24 ENCOUNTER — Telehealth: Payer: Self-pay

## 2024-07-24 NOTE — Telephone Encounter (Signed)
 Opened encounter in error

## 2024-07-28 NOTE — Telephone Encounter (Signed)
 Forms completed and returned to [place]

## 2024-07-29 ENCOUNTER — Other Ambulatory Visit

## 2024-07-31 ENCOUNTER — Other Ambulatory Visit: Payer: Self-pay | Admitting: Pharmacist

## 2024-07-31 MED ORDER — TRULICITY 3 MG/0.5ML ~~LOC~~ SOAJ: 3.0000 mg | SUBCUTANEOUS | 1 refills | Status: AC

## 2024-07-31 NOTE — Progress Notes (Cosign Needed)
" ° °  07/31/2024 Name: Kayla Brown MRN: [id number] DOB: 1950-07-05  Chief Complaint  Patient presents with   Medication Management   Diabetes    Kayla Brown is a 74 y.o. year old female. Patient presented for a phone visit today.   They were referred to the pharmacist by their PCP for assistance in managing diabetes and medication access.    Subjective:  Medication Access/Adherence  Current Pharmacy:  [id number] [address] [place],  - [address] [place] [address] Phone: [phone] Fax: [phone]   Patient reports affordability concerns with their medications: Yes  - screened for LIS / Medicare Extra Help at November 2024 appointment - pt did not qualify. Patient reports access/transportation concerns to their pharmacy: No  Patient reports adherence concerns with their medications:  No     Patient had been approved for [employer] patient assistance program to receive Ozempic  2mg  thru 06/24/2024. Last received shipment around 03/06/2024 for 4 boxes. She has 1 pen remaining but she does not qualify for Ozempic  for 2026. Discussed with Dr Mahlon and plan is to start [employer]  and apply for [facility] patient assistance program. Patient states she has not been able to check her mail in the last week due to ice so she is not sure if she had received a paper copy of the [facility] patient assistance program. Dr Mahlon has completed her portion and is has been faxed.  Diabetes: Current medications:  Lantus  10 units daily, Farxiga  10mg  daily, glipizide  10mg  twice a day, Ozempic  2mg  weekly (has about 4 doses left and will change to Trulicity  3mg  weekly)  Medications tried in the past: Tradjenta  - stopped when changed to Ozempic   Patient denies hypoglycemic s/sx including no dizziness, shakiness, sweating. Patient denies hyperglycemic symptoms including no polyuria, polydipsia, polyphagia, nocturia, neuropathy, blurred  vision.   Macrovascular and Microvascular Risk Reduction:  Statin? yes (atorvastatin  ); ACEi/ARB? No - Previously on RAASi therapy with spironolactone  and losartan but was stopped after developing AKI. Last urinary albumin /creatinine ratio:  Lab Results  Component Value Date   MICRALBCREAT 18 05/15/2024   MICRALBCREAT Unable to calculate 09/12/2023   MICRALBCREAT 40.0 07/23/2023   MICRALBCREAT 10 01/03/2023   Last eye exam:  Lab Results  Component Value Date   HMDIABEYEEXA No Retinopathy 08/26/2023   Last foot exam: 03/07/2023 Tobacco Use:  Tobacco Use: Low Risk (07/14/2024)   Patient History    Smoking Tobacco Use: Never    Smokeless Tobacco Use: Never    Passive Exposure: Not on file      Objective:  Lab Results  Component Value Date   HGBA1C 7.9 (H) 07/14/2024    Lab Results  Component Value Date   CREATININE 1.66 (H) 07/14/2024   BUN 35 (H) 07/14/2024   NA 139 07/14/2024   K 4.5 07/14/2024   CL 105 07/14/2024   CO2 28 07/14/2024    Lab Results  Component Value Date   CHOL 139 07/14/2024   HDL 52.90 07/14/2024   LDLCALC 54 07/14/2024   LDLDIRECT 50.0 05/23/2020   TRIG 162.0 (H) 07/14/2024   CHOLHDL 3 07/14/2024      Assessment/Plan:   Medication Access:  - Assisted patient in completing on line application for [employer] / [employer] .   Kayla Brown, PharmD Clinical Pharmacist [facility]      "

## 2024-08-20 ENCOUNTER — Encounter

## 2024-08-21 ENCOUNTER — Ambulatory Visit: Payer: Medicare Other | Admitting: Internal Medicine

## 2024-09-04 ENCOUNTER — Ambulatory Visit: Admitting: Podiatry

## 2024-11-11 ENCOUNTER — Ambulatory Visit: Admitting: Family Medicine
